# Patient Record
Sex: Male | Born: 1960
Health system: Southern US, Community
[De-identification: ages and names within clinical notes are randomized; demographics above are authoritative.]

## PROBLEM LIST (undated history)

## (undated) DIAGNOSIS — I10 Essential (primary) hypertension: Secondary | ICD-10-CM

## (undated) DIAGNOSIS — E119 Type 2 diabetes mellitus without complications: Secondary | ICD-10-CM

## (undated) DIAGNOSIS — R252 Cramp and spasm: Secondary | ICD-10-CM

## (undated) DIAGNOSIS — K219 Gastro-esophageal reflux disease without esophagitis: Secondary | ICD-10-CM

## (undated) DIAGNOSIS — Z86711 Personal history of pulmonary embolism: Secondary | ICD-10-CM

## (undated) DIAGNOSIS — G709 Myoneural disorder, unspecified: Secondary | ICD-10-CM

## (undated) DIAGNOSIS — S46219A Strain of muscle, fascia and tendon of other parts of biceps, unspecified arm, initial encounter: Secondary | ICD-10-CM

## (undated) DIAGNOSIS — Z86718 Personal history of other venous thrombosis and embolism: Secondary | ICD-10-CM

## (undated) DIAGNOSIS — B079 Viral wart, unspecified: Secondary | ICD-10-CM

## (undated) DIAGNOSIS — K648 Other hemorrhoids: Secondary | ICD-10-CM

## (undated) DIAGNOSIS — T7840XA Allergy, unspecified, initial encounter: Secondary | ICD-10-CM

## (undated) DIAGNOSIS — E785 Hyperlipidemia, unspecified: Secondary | ICD-10-CM

## (undated) DIAGNOSIS — K635 Polyp of colon: Secondary | ICD-10-CM

## (undated) DIAGNOSIS — A498 Other bacterial infections of unspecified site: Secondary | ICD-10-CM

## (undated) DIAGNOSIS — M1990 Unspecified osteoarthritis, unspecified site: Secondary | ICD-10-CM

## (undated) DIAGNOSIS — E291 Testicular hypofunction: Secondary | ICD-10-CM

## (undated) HISTORY — DX: Allergy, unspecified, initial encounter: T78.40XA

## (undated) HISTORY — DX: Hyperlipidemia, unspecified: E78.5

## (undated) HISTORY — DX: Strain of muscle, fascia and tendon of other parts of biceps, unspecified arm, initial encounter: S46.219A

## (undated) HISTORY — PX: BACK SURGERY: SHX140

## (undated) HISTORY — DX: Essential (primary) hypertension: I10

## (undated) HISTORY — DX: Personal history of other venous thrombosis and embolism: Z86.718

## (undated) HISTORY — PX: SKIN LESION EXCISION: SHX2412

## (undated) HISTORY — PX: FISSURECTOMY: SHX5244

## (undated) HISTORY — PX: EXCISIONAL HEMORRHOIDECTOMY: SHX1541

## (undated) HISTORY — PX: WISDOM TOOTH EXTRACTION: SHX21

## (undated) HISTORY — DX: Cramp and spasm: R25.2

## (undated) HISTORY — DX: Other bacterial infections of unspecified site: A49.8

## (undated) HISTORY — DX: Type 2 diabetes mellitus without complications: E11.9

## (undated) HISTORY — DX: Testicular hypofunction: E29.1

## (undated) HISTORY — DX: Polyp of colon: K63.5

## (undated) HISTORY — DX: Personal history of pulmonary embolism: Z86.711

## (undated) HISTORY — DX: Unspecified osteoarthritis, unspecified site: M19.90

---

## 1998-08-09 ENCOUNTER — Emergency Department (HOSPITAL_COMMUNITY): Admission: EM | Admit: 1998-08-09 | Discharge: 1998-08-09 | Payer: Self-pay | Admitting: Emergency Medicine

## 1998-08-10 ENCOUNTER — Ambulatory Visit (HOSPITAL_BASED_OUTPATIENT_CLINIC_OR_DEPARTMENT_OTHER): Admission: RE | Admit: 1998-08-10 | Discharge: 1998-08-10 | Payer: Self-pay | Admitting: *Deleted

## 2009-12-25 HISTORY — PX: APPENDECTOMY: SHX54

## 2010-11-22 ENCOUNTER — Encounter: Payer: Self-pay | Admitting: Emergency Medicine

## 2010-11-22 ENCOUNTER — Ambulatory Visit: Payer: Self-pay | Admitting: Diagnostic Radiology

## 2010-11-23 ENCOUNTER — Observation Stay (HOSPITAL_COMMUNITY)
Admission: EM | Admit: 2010-11-23 | Discharge: 2010-11-24 | Payer: Self-pay | Source: Home / Self Care | Admitting: Emergency Medicine

## 2010-12-25 DIAGNOSIS — K648 Other hemorrhoids: Secondary | ICD-10-CM

## 2010-12-25 HISTORY — DX: Other hemorrhoids: K64.8

## 2010-12-25 HISTORY — PX: COLONOSCOPY W/ BIOPSIES AND POLYPECTOMY: SHX1376

## 2011-03-08 LAB — URINALYSIS, ROUTINE W REFLEX MICROSCOPIC
Specific Gravity, Urine: 1.018 (ref 1.005–1.030)
pH: 7 (ref 5.0–8.0)

## 2011-03-08 LAB — DIFFERENTIAL
Basophils Absolute: 0.1 10*3/uL (ref 0.0–0.1)
Basophils Relative: 1 % (ref 0–1)
Monocytes Relative: 7 % (ref 3–12)
Neutro Abs: 10.3 10*3/uL — ABNORMAL HIGH (ref 1.7–7.7)
Neutrophils Relative %: 79 % — ABNORMAL HIGH (ref 43–77)

## 2011-03-08 LAB — COMPREHENSIVE METABOLIC PANEL
Alkaline Phosphatase: 65 U/L (ref 39–117)
BUN: 15 mg/dL (ref 6–23)
Creatinine, Ser: 0.9 mg/dL (ref 0.4–1.5)
Glucose, Bld: 104 mg/dL — ABNORMAL HIGH (ref 70–99)
Potassium: 4 mEq/L (ref 3.5–5.1)
Total Protein: 7.2 g/dL (ref 6.0–8.3)

## 2011-03-08 LAB — CBC
HCT: 49 % (ref 39.0–52.0)
MCH: 33.4 pg (ref 26.0–34.0)
MCHC: 35 g/dL (ref 30.0–36.0)
MCV: 95.5 fL (ref 78.0–100.0)
RDW: 12.5 % (ref 11.5–15.5)
WBC: 13.1 10*3/uL — ABNORMAL HIGH (ref 4.0–10.5)

## 2012-07-09 HISTORY — PX: SHOULDER ARTHROSCOPY W/ ROTATOR CUFF REPAIR: SHX2400

## 2012-08-21 ENCOUNTER — Inpatient Hospital Stay (HOSPITAL_COMMUNITY)
Admission: AD | Admit: 2012-08-21 | Discharge: 2012-08-26 | DRG: 581 | Disposition: A | Discharge status: Home Health | Payer: BC Managed Care – PPO | Source: Ambulatory Visit | Attending: Orthopedic Surgery | Admitting: Orthopedic Surgery

## 2012-08-21 ENCOUNTER — Encounter (HOSPITAL_COMMUNITY): Payer: Self-pay | Admitting: General Practice

## 2012-08-21 ENCOUNTER — Other Ambulatory Visit: Payer: Self-pay | Admitting: Orthopedic Surgery

## 2012-08-21 ENCOUNTER — Inpatient Hospital Stay (HOSPITAL_COMMUNITY): Payer: BC Managed Care – PPO

## 2012-08-21 DIAGNOSIS — M009 Pyogenic arthritis, unspecified: Secondary | ICD-10-CM | POA: Diagnosis present

## 2012-08-21 DIAGNOSIS — Z887 Allergy status to serum and vaccine status: Secondary | ICD-10-CM

## 2012-08-21 DIAGNOSIS — Y834 Other reconstructive surgery as the cause of abnormal reaction of the patient, or of later complication, without mention of misadventure at the time of the procedure: Secondary | ICD-10-CM | POA: Diagnosis present

## 2012-08-21 DIAGNOSIS — Z6838 Body mass index (BMI) 38.0-38.9, adult: Secondary | ICD-10-CM

## 2012-08-21 DIAGNOSIS — Z860100 Personal history of colon polyps, unspecified: Secondary | ICD-10-CM

## 2012-08-21 DIAGNOSIS — Z886 Allergy status to analgesic agent status: Secondary | ICD-10-CM

## 2012-08-21 DIAGNOSIS — Y92009 Unspecified place in unspecified non-institutional (private) residence as the place of occurrence of the external cause: Secondary | ICD-10-CM

## 2012-08-21 DIAGNOSIS — T8140XA Infection following a procedure, unspecified, initial encounter: Principal | ICD-10-CM | POA: Diagnosis present

## 2012-08-21 DIAGNOSIS — Z9089 Acquired absence of other organs: Secondary | ICD-10-CM

## 2012-08-21 DIAGNOSIS — M62838 Other muscle spasm: Secondary | ICD-10-CM | POA: Diagnosis present

## 2012-08-21 DIAGNOSIS — K648 Other hemorrhoids: Secondary | ICD-10-CM | POA: Diagnosis present

## 2012-08-21 DIAGNOSIS — K219 Gastro-esophageal reflux disease without esophagitis: Secondary | ICD-10-CM | POA: Diagnosis present

## 2012-08-21 DIAGNOSIS — A4901 Methicillin susceptible Staphylococcus aureus infection, unspecified site: Secondary | ICD-10-CM | POA: Diagnosis present

## 2012-08-21 DIAGNOSIS — Z880 Allergy status to penicillin: Secondary | ICD-10-CM

## 2012-08-21 HISTORY — DX: Gastro-esophageal reflux disease without esophagitis: K21.9

## 2012-08-21 HISTORY — DX: Other hemorrhoids: K64.8

## 2012-08-21 LAB — COMPREHENSIVE METABOLIC PANEL
ALT: 19 U/L (ref 0–53)
BUN: 12 mg/dL (ref 6–23)
CO2: 28 mEq/L (ref 19–32)
Calcium: 10.2 mg/dL (ref 8.4–10.5)
Creatinine, Ser: 0.98 mg/dL (ref 0.50–1.35)
GFR calc Af Amer: >90 mL/min (ref >90)
GFR calc non Af Amer: >90 mL/min (ref >90)
Glucose, Bld: 122 mg/dL — ABNORMAL HIGH (ref 70–99)
Total Protein: 7.6 g/dL (ref 6.0–8.3)

## 2012-08-21 LAB — CBC
Hemoglobin: 15.5 g/dL (ref 13.0–17.0)
MCH: 31.5 pg (ref 26.0–34.0)
MCHC: 34.8 g/dL (ref 30.0–36.0)
MCV: 90.4 fL (ref 78.0–100.0)
RBC: 4.92 MIL/uL (ref 4.22–5.81)

## 2012-08-21 LAB — DIFFERENTIAL
Basophils Absolute: 0 10*3/uL (ref 0.0–0.1)
Eosinophils Relative: 0 % (ref 0–5)
Lymphocytes Relative: 18 % (ref 12–46)
Lymphs Abs: 1.7 10*3/uL (ref 0.7–4.0)
Neutro Abs: 6.6 10*3/uL (ref 1.7–7.7)
Neutrophils Relative %: 71 % (ref 43–77)

## 2012-08-21 LAB — GRAM STAIN

## 2012-08-21 LAB — SEDIMENTATION RATE: Sed Rate: 39 mm/hr — ABNORMAL HIGH (ref 0–16)

## 2012-08-21 MED ORDER — VANCOMYCIN HCL 1000 MG IV SOLR
2000.0000 mg | Freq: Once | INTRAVENOUS | Status: AC
  Administered 2012-08-21: 2000 mg via INTRAVENOUS
  Filled 2012-08-21: qty 2000

## 2012-08-21 MED ORDER — HYDROCODONE-ACETAMINOPHEN 5-325 MG PO TABS
1.0000 | ORAL_TABLET | ORAL | Status: DC | PRN
  Administered 2012-08-21: 1 via ORAL
  Administered 2012-08-24 – 2012-08-26 (×4): 2 via ORAL
  Filled 2012-08-21 (×5): qty 2

## 2012-08-21 MED ORDER — ALUM & MAG HYDROXIDE-SIMETH 200-200-20 MG/5ML PO SUSP: 30.0000 mL | Freq: Four times a day (QID) | ORAL | Status: DC | PRN

## 2012-08-21 MED ORDER — ONDANSETRON HCL 4 MG/2ML IJ SOLN: 4.0000 mg | Freq: Four times a day (QID) | INTRAMUSCULAR | Status: DC | PRN

## 2012-08-21 MED ORDER — LACTATED RINGERS IV SOLN
INTRAVENOUS | Status: DC
  Administered 2012-08-21: 10 mL/h via INTRAVENOUS

## 2012-08-21 MED ORDER — DOCUSATE SODIUM 100 MG PO CAPS
100.0000 mg | ORAL_CAPSULE | Freq: Two times a day (BID) | ORAL | Status: DC
  Administered 2012-08-21 – 2012-08-26 (×9): 100 mg via ORAL
  Filled 2012-08-21 (×9): qty 1

## 2012-08-21 MED ORDER — ONDANSETRON HCL 4 MG PO TABS: 4.0000 mg | ORAL_TABLET | Freq: Four times a day (QID) | ORAL | Status: DC | PRN

## 2012-08-21 MED ORDER — VANCOMYCIN HCL IN DEXTROSE 1-5 GM/200ML-% IV SOLN
1000.0000 mg | Freq: Two times a day (BID) | INTRAVENOUS | Status: DC
  Administered 2012-08-22 – 2012-08-24 (×6): 1000 mg via INTRAVENOUS
  Filled 2012-08-21 (×8): qty 200

## 2012-08-21 MED ORDER — CHLORHEXIDINE GLUCONATE 4 % EX LIQD
60.0000 mL | Freq: Once | CUTANEOUS | Status: AC
  Administered 2012-08-22: 4 via TOPICAL
  Filled 2012-08-21: qty 60

## 2012-08-21 NOTE — H&P (Signed)
Ian [name]    Chief Complaint: L shoulder pain  HPI: The patient is a 51 y.o. male who is 7 weeks post op from an arthroscopic left [facility] who had been doing well until this past Sunday. He had about 48 hours of general malaise, and his wife noticed some erythema to one of his portals. He presented to our office today where we aspirated both the bursae and deep in the joint. The joint yielded a cloudy synovial fluid concerning for deep infection. He is being admitted for IV antibiotics and arthroscopic lavage tomorrow.  PMH otherwise healthy  No past surgical history on file.  No family history on file.  Social History:  does not have a smoking history on file. He does not have any smokeless tobacco history on file. His alcohol and drug histories not on file.  Allergies: Penicillin(hives), Aspirin,tetanus  Prior to Admission medications   Not on File    ROS: as above  Physical Exam: General appearance: Pt appears pale.  L shoulder superior portal is erythematous and raised. He has early firm endpoints with some pain on ROM Vitals @VSRANGES @  Assessment/Plan Impression: Post op infection Left shoulder Plan of Action: Admit for IV antibiotics and I&D tomorrow  [facility] for Dr. Francena Hanly 08/21/2012, 5:10 PM

## 2012-08-21 NOTE — Progress Notes (Signed)
ANTIBIOTIC CONSULT NOTE - INITIAL  Pharmacy Consult for Vancomycin Indication: shoulder infection  Allergies not on file  Patient Measurements: Height: 5\' 9"  (175.3 cm) Weight: 257 lb 0.9 oz (116.6 kg) IBW/kg (Calculated) : 70.7   Labs: Estimated Creatinine Clearance: 121 ml/min (by C-G formula based on Cr of 0.9).  Microbiology: No results found for this or any previous visit (from the past 720 hour(s)).  Medical History: No past medical history on file.  Assessment: The patient is a 52 y.o. male who is 7 weeks post op from an arthroscopic left shoulder RTC repair [facility] who had been doing well until this past Sunday. He had about 48 hours of general malaise, and his wife noticed some erythema to one of his portals. He presented to MD office today for shoulder aspiration. The joint yielded a cloudy synovial fluid concerning for deep infection.   Pharmacy asked to dose Vancomycin  Goal of Therapy:  Vancomycin trough level 15-20 mcg/ml  Plan:  1) Vancomycin 2000 mg iv loading dose 2) Vancomycin 1000 mg iv Q 12 hours 3) Follow renal function, cultures, plan  Thank you. Okey Regal, PharmD [phone]  08/21/2012,6:23 PM

## 2012-08-22 ENCOUNTER — Encounter (HOSPITAL_COMMUNITY): Payer: Self-pay | Admitting: Anesthesiology

## 2012-08-22 ENCOUNTER — Encounter (HOSPITAL_COMMUNITY)
Admission: AD | Disposition: A | Discharge status: Home Health | Payer: Self-pay | Source: Ambulatory Visit | Attending: Orthopedic Surgery

## 2012-08-22 ENCOUNTER — Inpatient Hospital Stay (HOSPITAL_COMMUNITY): Payer: BC Managed Care – PPO | Admitting: Anesthesiology

## 2012-08-22 DIAGNOSIS — M009 Pyogenic arthritis, unspecified: Secondary | ICD-10-CM

## 2012-08-22 HISTORY — PX: SHOULDER ARTHROSCOPY: SHX128

## 2012-08-22 LAB — URINE CULTURE
Colony Count: NO GROWTH
Special Requests: NORMAL

## 2012-08-22 SURGERY — ARTHROSCOPY, SHOULDER
Anesthesia: General | Site: Shoulder | Laterality: Left | Wound class: Dirty or Infected

## 2012-08-22 MED ORDER — METOCLOPRAMIDE HCL 10 MG PO TABS
5.0000 mg | ORAL_TABLET | Freq: Three times a day (TID) | ORAL | Status: DC | PRN
  Filled 2012-08-22: qty 1

## 2012-08-22 MED ORDER — ROCURONIUM BROMIDE 100 MG/10ML IV SOLN
INTRAVENOUS | Status: DC | PRN
  Administered 2012-08-22: 50 mg via INTRAVENOUS

## 2012-08-22 MED ORDER — NEOSTIGMINE METHYLSULFATE 1 MG/ML IJ SOLN
INTRAMUSCULAR | Status: DC | PRN
  Administered 2012-08-22: 4 mg via INTRAVENOUS

## 2012-08-22 MED ORDER — TEMAZEPAM 15 MG PO CAPS
15.0000 mg | ORAL_CAPSULE | Freq: Every evening | ORAL | Status: DC | PRN
  Administered 2012-08-22 – 2012-08-25 (×3): 15 mg via ORAL
  Filled 2012-08-22 (×3): qty 1

## 2012-08-22 MED ORDER — CHLORHEXIDINE GLUCONATE 0.12 % MT SOLN
15.0000 mL | Freq: Two times a day (BID) | OROMUCOSAL | Status: DC
  Administered 2012-08-22 – 2012-08-23 (×3): 15 mL via OROMUCOSAL
  Filled 2012-08-22 (×2): qty 15

## 2012-08-22 MED ORDER — ARTIFICIAL TEARS OP OINT
TOPICAL_OINTMENT | OPHTHALMIC | Status: DC | PRN
  Administered 2012-08-22: 1 via OPHTHALMIC

## 2012-08-22 MED ORDER — MIDAZOLAM HCL 5 MG/5ML IJ SOLN
INTRAMUSCULAR | Status: DC | PRN
  Administered 2012-08-22: 2 mg via INTRAVENOUS

## 2012-08-22 MED ORDER — GLYCOPYRROLATE 0.2 MG/ML IJ SOLN
INTRAMUSCULAR | Status: DC | PRN
  Administered 2012-08-22: .6 mg via INTRAVENOUS

## 2012-08-22 MED ORDER — METOCLOPRAMIDE HCL 5 MG/ML IJ SOLN
5.0000 mg | Freq: Three times a day (TID) | INTRAMUSCULAR | Status: DC | PRN
  Filled 2012-08-22: qty 2

## 2012-08-22 MED ORDER — HYDROMORPHONE HCL PF 1 MG/ML IJ SOLN
0.2500 mg | INTRAMUSCULAR | Status: DC | PRN
  Administered 2012-08-22 (×2): 0.5 mg via INTRAVENOUS

## 2012-08-22 MED ORDER — ACETAMINOPHEN 650 MG RE SUPP: 650.0000 mg | Freq: Four times a day (QID) | RECTAL | Status: DC | PRN

## 2012-08-22 MED ORDER — PANTOPRAZOLE SODIUM 40 MG PO TBEC
40.0000 mg | DELAYED_RELEASE_TABLET | Freq: Every day | ORAL | Status: DC
  Administered 2012-08-22 – 2012-08-26 (×5): 40 mg via ORAL
  Filled 2012-08-22 (×4): qty 1

## 2012-08-22 MED ORDER — LACTATED RINGERS IV SOLN
INTRAVENOUS | Status: DC | PRN
  Administered 2012-08-22 (×2): via INTRAVENOUS

## 2012-08-22 MED ORDER — ACETAMINOPHEN 325 MG PO TABS: 650.0000 mg | ORAL_TABLET | Freq: Four times a day (QID) | ORAL | Status: DC | PRN

## 2012-08-22 MED ORDER — BISACODYL 5 MG PO TBEC: 5.0000 mg | DELAYED_RELEASE_TABLET | Freq: Every day | ORAL | Status: DC | PRN

## 2012-08-22 MED ORDER — DIPHENHYDRAMINE HCL 12.5 MG/5ML PO ELIX
12.5000 mg | ORAL_SOLUTION | ORAL | Status: DC | PRN
  Filled 2012-08-22: qty 10

## 2012-08-22 MED ORDER — CHLORHEXIDINE GLUCONATE CLOTH 2 % EX PADS
6.0000 | MEDICATED_PAD | Freq: Every day | CUTANEOUS | Status: DC
  Administered 2012-08-22: 6 via TOPICAL

## 2012-08-22 MED ORDER — ONDANSETRON HCL 4 MG PO TABS: 4.0000 mg | ORAL_TABLET | Freq: Four times a day (QID) | ORAL | Status: DC | PRN

## 2012-08-22 MED ORDER — PROPOFOL 10 MG/ML IV EMUL: INTRAVENOUS | Status: DC | PRN

## 2012-08-22 MED ORDER — ONDANSETRON HCL 4 MG/2ML IJ SOLN
INTRAMUSCULAR | Status: DC | PRN
  Administered 2012-08-22: 4 mg via INTRAVENOUS

## 2012-08-22 MED ORDER — SODIUM CHLORIDE 0.9 % IR SOLN
Status: DC | PRN
  Administered 2012-08-22 (×3): 3000 mL

## 2012-08-22 MED ORDER — EPHEDRINE SULFATE 50 MG/ML IJ SOLN
INTRAMUSCULAR | Status: DC | PRN
  Administered 2012-08-22 (×2): 5 mg via INTRAVENOUS
  Administered 2012-08-22: 10 mg via INTRAVENOUS
  Administered 2012-08-22: 5 mg via INTRAVENOUS

## 2012-08-22 MED ORDER — LIDOCAINE HCL 4 % MT SOLN
OROMUCOSAL | Status: DC | PRN
  Administered 2012-08-22: 4 mL via TOPICAL

## 2012-08-22 MED ORDER — ACETAMINOPHEN 10 MG/ML IV SOLN
INTRAVENOUS | Status: DC | PRN
  Administered 2012-08-22: 1000 mg via INTRAVENOUS

## 2012-08-22 MED ORDER — MENTHOL 3 MG MT LOZG: 1.0000 | LOZENGE | OROMUCOSAL | Status: DC | PRN

## 2012-08-22 MED ORDER — ONDANSETRON HCL 4 MG/2ML IJ SOLN: 4.0000 mg | Freq: Four times a day (QID) | INTRAMUSCULAR | Status: DC | PRN

## 2012-08-22 MED ORDER — ACETAMINOPHEN 10 MG/ML IV SOLN
INTRAVENOUS | Status: AC
  Filled 2012-08-22: qty 100

## 2012-08-22 MED ORDER — FENTANYL CITRATE 0.05 MG/ML IJ SOLN
INTRAMUSCULAR | Status: DC | PRN
  Administered 2012-08-22 (×2): 25 ug via INTRAVENOUS
  Administered 2012-08-22 (×3): 50 ug via INTRAVENOUS
  Administered 2012-08-22: 150 ug via INTRAVENOUS
  Administered 2012-08-22: 50 ug via INTRAVENOUS

## 2012-08-22 MED ORDER — LIDOCAINE HCL (CARDIAC) 20 MG/ML IV SOLN
INTRAVENOUS | Status: DC | PRN
  Administered 2012-08-22: 10 mg via INTRAVENOUS

## 2012-08-22 MED ORDER — LORATADINE 10 MG PO TABS
10.0000 mg | ORAL_TABLET | Freq: Every day | ORAL | Status: DC
  Administered 2012-08-22 – 2012-08-26 (×5): 10 mg via ORAL
  Filled 2012-08-22 (×5): qty 1

## 2012-08-22 MED ORDER — BIOTENE DRY MOUTH MT LIQD
15.0000 mL | Freq: Two times a day (BID) | OROMUCOSAL | Status: DC
  Administered 2012-08-22: 15 mL via OROMUCOSAL

## 2012-08-22 MED ORDER — METHOCARBAMOL 100 MG/ML IJ SOLN
500.0000 mg | Freq: Four times a day (QID) | INTRAVENOUS | Status: DC | PRN
  Filled 2012-08-22: qty 5

## 2012-08-22 MED ORDER — HYDROMORPHONE HCL PF 1 MG/ML IJ SOLN
0.5000 mg | INTRAMUSCULAR | Status: DC | PRN
  Administered 2012-08-23 (×2): 1 mg via INTRAVENOUS
  Filled 2012-08-22 (×2): qty 1

## 2012-08-22 MED ORDER — METHOCARBAMOL 500 MG PO TABS
500.0000 mg | ORAL_TABLET | Freq: Four times a day (QID) | ORAL | Status: DC | PRN
  Administered 2012-08-23 – 2012-08-26 (×6): 500 mg via ORAL
  Filled 2012-08-22 (×6): qty 1

## 2012-08-22 MED ORDER — MEPERIDINE HCL 25 MG/ML IJ SOLN: 6.2500 mg | INTRAMUSCULAR | Status: DC | PRN

## 2012-08-22 MED ORDER — MUPIROCIN 2 % EX OINT
1.0000 "application " | TOPICAL_OINTMENT | Freq: Two times a day (BID) | CUTANEOUS | Status: AC
  Administered 2012-08-22 – 2012-08-26 (×10): 1 via NASAL
  Filled 2012-08-22 (×2): qty 22

## 2012-08-22 MED ORDER — ENOXAPARIN SODIUM 40 MG/0.4ML ~~LOC~~ SOLN
40.0000 mg | SUBCUTANEOUS | Status: DC
  Administered 2012-08-23 – 2012-08-26 (×4): 40 mg via SUBCUTANEOUS
  Filled 2012-08-22 (×5): qty 0.4

## 2012-08-22 MED ORDER — PROMETHAZINE HCL 25 MG/ML IJ SOLN: 6.2500 mg | INTRAMUSCULAR | Status: DC | PRN

## 2012-08-22 MED ORDER — MIDAZOLAM HCL 2 MG/2ML IJ SOLN: 0.5000 mg | Freq: Once | INTRAMUSCULAR | Status: DC | PRN

## 2012-08-22 MED ORDER — LACTATED RINGERS IV SOLN
INTRAVENOUS | Status: DC
  Administered 2012-08-23: 09:00:00 via INTRAVENOUS

## 2012-08-22 MED ORDER — PROPOFOL 10 MG/ML IV BOLUS
INTRAVENOUS | Status: DC | PRN
  Administered 2012-08-22: 200 mg via INTRAVENOUS

## 2012-08-22 MED ORDER — PHENOL 1.4 % MT LIQD
1.0000 | OROMUCOSAL | Status: DC | PRN
  Filled 2012-08-22: qty 177

## 2012-08-22 SURGICAL SUPPLY — 49 items
BLADE CUTTER GATOR 3.5 (BLADE) ×2 IMPLANT
BLADE GREAT WHITE 4.2 (BLADE) ×2 IMPLANT
BLADE SURG 11 STRL SS (BLADE) ×2 IMPLANT
BOOTCOVER CLEANROOM LRG (PROTECTIVE WEAR) ×4 IMPLANT
BUR OVAL 4.0 (BURR) ×2 IMPLANT
CANISTER SUCT LVC 12 LTR MEDI- (MISCELLANEOUS) ×2 IMPLANT
CANNULA ACUFLEX KIT 5X76 (CANNULA) ×2 IMPLANT
CLOTH BEACON ORANGE TIMEOUT ST (SAFETY) ×2 IMPLANT
DRAPE INCISE 23X17 IOBAN STRL (DRAPES) ×1
DRAPE INCISE IOBAN 23X17 STRL (DRAPES) ×1 IMPLANT
DRAPE INCISE IOBAN 66X45 STRL (DRAPES) ×2 IMPLANT
DRAPE STERI 35X30 U-POUCH (DRAPES) ×2 IMPLANT
DRAPE SURG 17X11 SM STRL (DRAPES) ×2 IMPLANT
DRAPE U-SHAPE 47X51 STRL (DRAPES) ×2 IMPLANT
DRSG PAD ABDOMINAL 8X10 ST (GAUZE/BANDAGES/DRESSINGS) ×2 IMPLANT
DURAPREP 26ML APPLICATOR (WOUND CARE) ×4 IMPLANT
GLOVE BIO SURGEON STRL SZ7.5 (GLOVE) ×2 IMPLANT
GLOVE BIO SURGEON STRL SZ8 (GLOVE) ×2 IMPLANT
GLOVE EUDERMIC 7 POWDERFREE (GLOVE) ×2 IMPLANT
GLOVE SS BIOGEL STRL SZ 7.5 (GLOVE) ×1 IMPLANT
GLOVE SUPERSENSE BIOGEL SZ 7.5 (GLOVE) ×1
GOWN STRL NON-REIN LRG LVL3 (GOWN DISPOSABLE) ×2 IMPLANT
GOWN STRL REIN XL XLG (GOWN DISPOSABLE) ×4 IMPLANT
KIT BASIN OR (CUSTOM PROCEDURE TRAY) ×2 IMPLANT
KIT ROOM TURNOVER OR (KITS) ×2 IMPLANT
KIT SHOULDER TRACTION (DRAPES) ×2 IMPLANT
MANIFOLD NEPTUNE II (INSTRUMENTS) ×2 IMPLANT
NDL SUT 6 .5 CRC .975X.05 MAYO (NEEDLE) IMPLANT
NEEDLE MAYO TAPER (NEEDLE)
NEEDLE SPNL 18GX3.5 QUINCKE PK (NEEDLE) ×2 IMPLANT
NS IRRIG 1000ML POUR BTL (IV SOLUTION) ×2 IMPLANT
PACK SHOULDER (CUSTOM PROCEDURE TRAY) ×2 IMPLANT
PAD ARMBOARD 7.5X6 YLW CONV (MISCELLANEOUS) ×4 IMPLANT
SET ARTHROSCOPY TUBING (MISCELLANEOUS) ×1
SET ARTHROSCOPY TUBING LN (MISCELLANEOUS) ×1 IMPLANT
SLING ARM FOAM STRAP LRG (SOFTGOODS) IMPLANT
SLING ARM FOAM STRAP MED (SOFTGOODS) IMPLANT
SPONGE GAUZE 4X4 12PLY (GAUZE/BANDAGES/DRESSINGS) ×2 IMPLANT
SPONGE LAP 4X18 X RAY DECT (DISPOSABLE) ×2 IMPLANT
STRIP CLOSURE SKIN 1/2X4 (GAUZE/BANDAGES/DRESSINGS) ×2 IMPLANT
SUT MNCRL AB 3-0 PS2 18 (SUTURE) ×2 IMPLANT
SUT PDS AB 1 CT  36 (SUTURE)
SUT PDS AB 1 CT 36 (SUTURE) IMPLANT
SYR 20CC LL (SYRINGE) ×2 IMPLANT
TAPE PAPER 3X10 WHT MICROPORE (GAUZE/BANDAGES/DRESSINGS) ×2 IMPLANT
TOWEL OR 17X24 6PK STRL BLUE (TOWEL DISPOSABLE) ×2 IMPLANT
TOWEL OR 17X26 10 PK STRL BLUE (TOWEL DISPOSABLE) ×2 IMPLANT
WAND SUCTION MAX 4MM 90S (SURGICAL WAND) ×2 IMPLANT
WATER STERILE IRR 1000ML POUR (IV SOLUTION) ×2 IMPLANT

## 2012-08-22 NOTE — Progress Notes (Signed)
Pt's surgical PCR + for staph;negative for MRSA.  Positive staph PCR standing orders activated.

## 2012-08-22 NOTE — Op Note (Signed)
08/21/2012 - 08/22/2012  12:45 PM  PATIENT:   Ian [name]  51 y.o. male  PRE-OPERATIVE DIAGNOSIS:  left shoulder infection vs inflammation  POST-OPERATIVE DIAGNOSIS:  Same with recurrent rotator cuff tear  PROCEDURE:  Synovectomy, lavage G-H joint, bursectomy, removal retained suture and suture anchors debridement recurrent rotator cuff tear.  SURGEON:  Senaida Lange M.D.  ASSISTANTS: Shuford pac   ANESTHESIA:   GET  EBL: min  SPECIMEN:  Tissue and anchors for pathology  Drains: none   PATIENT DISPOSITION:  PACU - hemodynamically stable.    PLAN OF CARE: Admit to inpatient   I have spoken to Dr. Colonel Bald in pathology regarding analysis of the tissue and suture material  Dictation# [id number]

## 2012-08-22 NOTE — Anesthesia Preprocedure Evaluation (Addendum)
Anesthesia Evaluation  Patient identified by MRN, date of birth, ID band Patient awake    Reviewed: Allergy & Precautions, H&P , NPO status , Patient's Chart, lab work & pertinent test results  History of Anesthesia Complications Negative for: history of anesthetic complications  Airway Mallampati: II TM Distance: >3 FB Neck ROM: Full    Dental  (+) Teeth Intact and Dental Advisory Given   Pulmonary neg pulmonary ROS,  breath sounds clear to auscultation  Pulmonary exam normal       Cardiovascular negative cardio ROS  Rhythm:Regular Rate:Normal     Neuro/Psych negative neurological ROS     GI/Hepatic Neg liver ROS, GERD-  Medicated and Controlled,  Endo/Other  Morbid obesity  Renal/GU negative Renal ROS     Musculoskeletal   Abdominal (+) + obese,   Peds  Hematology   Anesthesia Other Findings   Reproductive/Obstetrics                          Anesthesia Physical Anesthesia Plan  ASA: II  Anesthesia Plan: General   Post-op Pain Management:    Induction: Intravenous  Airway Management Planned: Oral ETT  Additional Equipment:   Intra-op Plan:   Post-operative Plan: Extubation in OR  Informed Consent: I have reviewed the patients History and Physical, chart, labs and discussed the procedure including the risks, benefits and alternatives for the proposed anesthesia with the patient or authorized representative who has indicated his/her understanding and acceptance.   Dental advisory given  Plan Discussed with: CRNA, Anesthesiologist and Surgeon  Anesthesia Plan Comments: (Plan routine monitors, GETA)       Anesthesia Quick Evaluation

## 2012-08-22 NOTE — Consult Note (Signed)
[facility]    Date of Admission:  08/21/2012  Date of Consult:  08/22/2012  Reason for Consult:septic shoulder Referring Physician: Dr. Rennis Chris   HPI: Ian [name] is an 51 y.o. male. Who was  7 weeks post op from an arthroscopic left shoulder RTC repair on [facility] who had been doing well until this past Sunday. He had about 48 hours of general malaise, and his wife noticed some erythema to one of his portals. He presented to Dr. Supple\'s office today where aspiration of  both the bursae and deep in the joint was performed. The joint yielded a cloudy synovial fluid concerning for deep infection. He was admitted yesterday and started on vancomycin. He is going to the operating room today for formal incision and debridement and lavage of the joint and hopefully for obtainment of further cultures. He was not on antibiotics prior to the past for it having been performed at Dr. supple\'s office. Patient has a stated allergy to penicillin that he had as a child. He states he was evaluated by an allergist in [place] who confirmed his allergy to penicillin. He believes he has tolerated Keflex in the past without difficulty although he was unsure when he received it. I went back and looked through the old electronic medical record provided by [employer] and could find no record of him receiving a supple Sporn in house prior to his appendectomy.     Past Medical History  Diagnosis Date  . GERD (gastroesophageal reflux disease)   . Internal bleeding hemorrhoids 2012    "might bleed once/month; only when I strain"    Past Surgical History  Procedure Date  . Shoulder arthroscopy w/ rotator cuff repair 07/09/2012    left  . Appendectomy 2011  . Colonoscopy w/ biopsies and polypectomy 2012  ergies:   Allergies  Allergen Reactions  . Asa (Aspirin) Nausea Only and Rash  . Penicillins Rash  . Pork-Derived Products Diarrhea and Nausea And Vomiting  .  Tetanus Toxoids Anaphylaxis  . Beef-Derived Products Nausea And Vomiting    "don\'t digest beef very well"     Medications: I have reviewed patients current medications as documented in Epic Anti-infectives     Start     Dose/Rate Route Frequency Ordered Stop   08/22/12 0800   vancomycin (VANCOCIN) IVPB 1000 mg/200 mL premix        1,000 mg 200 mL/hr over 60 Minutes Intravenous Every 12 hours 08/21/12 1829     08 /28/13 1930   vancomycin (VANCOCIN) 2,000 mg in sodium chloride 0.9 % 500 mL IVPB        2,000 mg 250 mL/hr over 120 Minutes Intravenous  Once 08/21/12 1829 08/21/12 2300          Social History:  reports that he has never smoked. He has never used smokeless tobacco. He reports that he drinks alcohol. He reports that he does not use illicit drugs.  History reviewed. No pertinent family history.  As in HPI and primary teams notes otherwise 12 point review of systems is negative  Blood pressure 137/87, pulse 78, temperature 98 F (36.7 C), temperature source Oral, resp. rate 18, height 5\' 9"  (1.753 m), weight 257 lb 0.9 oz (116.6 kg), SpO2 99.00%. General: Alert and awake, oriented x3, not in any acute distress. HEENT: anicteric sclera, pupils reactive to light and accommodation, EOMI, oropharynx clear and without exudate CVS regular rate, normal r,  no murmur rubs or  gallops Chest: clear to auscultation bilaterally, no wheezing, rales or rhonchi Abdomen: soft nontender, nondistended, normal bowel sounds, Extremities: He has raised ears edema overlying the left shoulder. This is warm and tender as is the entire joint Skin: See above Neuro: nonfocal, strength and sensation intact   Results for orders placed during the hospital encounter of 08/21/12 (from the past 48 hour(s))  BODY FLUID CULTURE     Status: Normal (Preliminary result)   Collection Time   08/21/12  6:23 PM      Component Value Range Comment   Specimen Description FLUID SHOULDER LEFT      Special  Requests BURSAE FLUID GLENOHUMERAL JOINT FLUID ON SWABS      Gram Stain        Value: ABUNDANT WBC PRESENT,BOTH PMN AND MONONUCLEAR     NO ORGANISMS SEEN     Gram Stain Report Called to,Read Back By and Verified With: Gram Stain Report Called to,Read Back By and Verified With: DR SUPPLE 2057 08/21/12 BY A BROWNING   Culture PENDING      Report Status PENDING     ANAEROBIC CULTURE     Status: Normal (Preliminary result)   Collection Time   08/21/12  6:25 PM      Component Value Range Comment   Specimen Description FLUID SHOULDER LEFT      Special Requests BURSAE FLUID GLENOHUMERAL JOINT FLUID ON SWABS      Gram Stain        Value: ABUNDANT WBC PRESENT,BOTH PMN AND MONONUCLEAR     NO ORGANISMS SEEN     Gram Stain Report Called to,Read Back By and Verified With: Gram Stain Report Called to,Read Back By and Verified With: DR SUPPLE 2057 08/21/12 BY A BROWNING   Culture        Value: NO ANAEROBES ISOLATED; CULTURE IN PROGRESS FOR 5 DAYS   Report Status PENDING     GRAM STAIN     Status: Normal   Collection Time   08/21/12  6:25 PM      Component Value Range Comment   Specimen Description FLUID SHOULDER LEFT      Special Requests BURSAE FLUID GLENOHUMERAL JOINT FLUID ON SWABS      Gram Stain        Value: ABUNDANT WBC PRESENT,BOTH PMN AND MONONUCLEAR     NO ORGANISMS SEEN     Gram Stain Report Called to,Read Back By and Verified With: DR SUPPLE 2057 08/21/12 A BROWNING   Report Status 08/21/2012 FINAL     BODY FLUID CULTURE     Status: Normal (Preliminary result)   Collection Time   08/21/12  6:35 PM      Component Value Range Comment   Specimen Description FLUID SHOULDER LEFT      Special Requests        Value: GLENOHUMERAL ON SWABS LEFT GLENOHUMERAL FLUID ON SWABS BURSAE ON SWABS   Gram Stain        Value: FEW WBC PRESENT,BOTH PMN AND MONONUCLEAR     NO ORGANISMS SEEN     Gram Stain Report Called to,Read Back By and Verified With: Gram Stain Report Called to,Read Back By and Verified  With: DR SUPPLE 2057 08/21/12 BY A BROWNING Performed at [facility]   Culture PENDING      Report Status PENDING     ANAEROBIC CULTURE     Status: Normal (Preliminary result)   Collection Time   08/21/12  6:36 PM  Component Value Range Comment   Specimen Description FLUID SHOULDER LEFT      Special Requests        Value: GLENOHUMERAL ON SWABS LEFT GLENOHUMERAL FLUID ON SWABS BURSAE FLUID ON SWABS   Gram Stain        Value: FEW WBC PRESENT,BOTH PMN AND MONONUCLEAR     NO ORGANISMS SEEN     Gram Stain Report Called to,Read Back By and Verified With: Gram Stain Report Called to,Read Back By and Verified With: DR SUPPLE 2057 08/21/12 BY A BROWNING Performed at [facility]   Culture        Value: NO ANAEROBES ISOLATED; CULTURE IN PROGRESS FOR 5 DAYS   Report Status PENDING     GRAM STAIN     Status: Normal   Collection Time   08/21/12  6:36 PM      Component Value Range Comment   Specimen Description FLUID SHOULDER LEFT      Special Requests GLENOHUMERAL SWABS BURSAE FLUID ON SWABS      Gram Stain        Value: FEW WBC PRESENT,BOTH PMN AND MONONUCLEAR     NO ORGANISMS SEEN     Gram Stain Report Called to,Read Back By and Verified With: DR SUPPLE 2057 08/21/12 A BROWNING   Report Status 08/21/2012 FINAL     SURGICAL PCR SCREEN     Status: Abnormal   Collection Time   08/21/12  6:44 PM      Component Value Range Comment   MRSA, PCR NEGATIVE  NEGATIVE    Staphylococcus aureus POSITIVE (*) NEGATIVE   CBC     Status: Normal   Collection Time   08/21/12  7:28 PM      Component Value Range Comment   WBC 9.3  4.0 - 10.5 K/uL    RBC 4.92  4.22 - 5.81 MIL/uL    Hemoglobin 15.5  13.0 - 17.0 g/dL    HCT 09.8  11.9 - 14.7 %    MCV 90.4  78.0 - 100.0 fL    MCH 31.5  26.0 - 34.0 pg    MCHC 34.8  30.0 - 36.0 g/dL    RDW 82.9  56.2 - 13.0 %    Platelets 224  150 - 400 K/uL   COMPREHENSIVE METABOLIC PANEL     Status: Abnormal   Collection Time   08/21/12  7:28 PM       Component Value Range Comment   Sodium 136  135 - 145 mEq/L    Potassium 4.5  3.5 - 5.1 mEq/L    Chloride 98  96 - 112 mEq/L    CO2 28  19 - 32 mEq/L    Glucose, Bld 122 (*) 70 - 99 mg/dL    BUN 12  6 - 23 mg/dL    Creatinine, Ser 8.65  0.50 - 1.35 mg/dL    Calcium 78.4  8.4 - 10.5 mg/dL    Total Protein 7.6  6.0 - 8.3 g/dL    Albumin 3.4 (*) 3.5 - 5.2 g/dL    AST 18  0 - 37 U/L    ALT 19  0 - 53 U/L    Alkaline Phosphatase 64  39 - 117 U/L    Total Bilirubin 0.6  0.3 - 1.2 mg/dL    GFR calc non Af Amer >90  >90 mL/min    GFR calc Af Amer >90  >90 mL/min   DIFFERENTIAL     Status: Normal  Collection Time   08/21/12  7:28 PM      Component Value Range Comment   Neutrophils Relative 71  43 - 77 %    Neutro Abs 6.6  1.7 - 7.7 K/uL    Lymphocytes Relative 18  12 - 46 %    Lymphs Abs 1.7  0.7 - 4.0 K/uL    Monocytes Relative 10  3 - 12 %    Monocytes Absolute 0.9  0.1 - 1.0 K/uL    Eosinophils Relative 0  0 - 5 %    Eosinophils Absolute 0.0  0.0 - 0.7 K/uL    Basophils Relative 0  0 - 1 %    Basophils Absolute 0.0  0.0 - 0.1 K/uL   C-REACTIVE PROTEIN     Status: Abnormal   Collection Time   08/21/12  7:28 PM      Component Value Range Comment   CRP 8.8 (*) <0.60 mg/dL   SEDIMENTATION RATE     Status: Abnormal   Collection Time   08/21/12  7:28 PM      Component Value Range Comment   Sed Rate 39 (*) 0 - 16 mm/hr       Component Value Date/Time   SDES FLUID SHOULDER LEFT 08/21/2012 1836   SDES FLUID SHOULDER LEFT 08/21/2012 1836   SPECREQUEST GLENOHUMERAL ON SWABS LEFT GLENOHUMERAL FLUID ON SWABS BURSAE FLUID ON SWABS 08/21/2012 1836   SPECREQUEST GLENOHUMERAL SWABS BURSAE FLUID ON SWABS 08/21/2012 1836   CULT NO ANAEROBES ISOLATED; CULTURE IN PROGRESS FOR 5 DAYS 08/21/2012 1836   REPTSTATUS PENDING 08/21/2012 1836   REPTSTATUS 08/21/2012 FINAL 08/21/2012 1836   X-ray Chest Pa And Lateral   08/21/2012  *RADIOLOGY REPORT*  Clinical Data: Preoperative assessment for left shoulder  surgery  CHEST - 2 VIEW  Comparison: 11/23/2010  Findings: Normal heart size, mediastinal contours, and pulmonary vascularity. Lungs clear. No pleural effusion or pneumothorax. No acute osseous findings.  IMPRESSION: No acute abnormalities.   Original Report Authenticated By: Lollie Marrow, M.D.      Recent Results (from the past 720 hour(s))  BODY FLUID CULTURE     Status: Normal (Preliminary result)   Collection Time   08/21/12  6:23 PM      Component Value Range Status Comment   Specimen Description FLUID SHOULDER LEFT   Final    Special Requests BURSAE FLUID GLENOHUMERAL JOINT FLUID ON SWABS   Final    Gram Stain     Final    Value: ABUNDANT WBC PRESENT,BOTH PMN AND MONONUCLEAR     NO ORGANISMS SEEN     Gram Stain Report Called to,Read Back By and Verified With: Gram Stain Report Called to,Read Back By and Verified With: DR SUPPLE 2057 08/21/12 BY A BROWNING   Culture PENDING   Incomplete    Report Status PENDING   Incomplete   ANAEROBIC CULTURE     Status: Normal (Preliminary result)   Collection Time   08/21/12  6:25 PM      Component Value Range Status Comment   Specimen Description FLUID SHOULDER LEFT   Final    Special Requests BURSAE FLUID GLENOHUMERAL JOINT FLUID ON SWABS   Final    Gram Stain     Final    Value: ABUNDANT WBC PRESENT,BOTH PMN AND MONONUCLEAR     NO ORGANISMS SEEN     Gram Stain Report Called to,Read Back By and Verified With: Gram Stain Report Called to,Read Back By and Verified With: DR  SUPPLE 2057 08/21/12 BY A BROWNING   Culture     Final    Value: NO ANAEROBES ISOLATED; CULTURE IN PROGRESS FOR 5 DAYS   Report Status PENDING   Incomplete   GRAM STAIN     Status: Normal   Collection Time   08/21/12  6:25 PM      Component Value Range Status Comment   Specimen Description FLUID SHOULDER LEFT   Final    Special Requests BURSAE FLUID GLENOHUMERAL JOINT FLUID ON SWABS   Final    Gram Stain     Final    Value: ABUNDANT WBC PRESENT,BOTH PMN AND MONONUCLEAR      NO ORGANISMS SEEN     Gram Stain Report Called to,Read Back By and Verified With: DR SUPPLE 2057 08/21/12 A BROWNING   Report Status 08/21/2012 FINAL   Final   BODY FLUID CULTURE     Status: Normal (Preliminary result)   Collection Time   08/21/12  6:35 PM      Component Value Range Status Comment   Specimen Description FLUID SHOULDER LEFT   Final    Special Requests     Final    Value: GLENOHUMERAL ON SWABS LEFT GLENOHUMERAL FLUID ON SWABS BURSAE ON SWABS   Gram Stain     Final    Value: FEW WBC PRESENT,BOTH PMN AND MONONUCLEAR     NO ORGANISMS SEEN     Gram Stain Report Called to,Read Back By and Verified With: Gram Stain Report Called to,Read Back By and Verified With: DR SUPPLE 2057 08/21/12 BY A BROWNING Performed at [facility]   Culture PENDING   Incomplete    Report Status PENDING   Incomplete   ANAEROBIC CULTURE     Status: Normal (Preliminary result)   Collection Time   08/21/12  6:36 PM      Component Value Range Status Comment   Specimen Description FLUID SHOULDER LEFT   Final    Special Requests     Final    Value: GLENOHUMERAL ON SWABS LEFT GLENOHUMERAL FLUID ON SWABS BURSAE FLUID ON SWABS   Gram Stain     Final    Value: FEW WBC PRESENT,BOTH PMN AND MONONUCLEAR     NO ORGANISMS SEEN     Gram Stain Report Called to,Read Back By and Verified With: Gram Stain Report Called to,Read Back By and Verified With: DR SUPPLE 2057 08/21/12 BY A BROWNING Performed at [facility]   Culture     Final    Value: NO ANAEROBES ISOLATED; CULTURE IN PROGRESS FOR 5 DAYS   Report Status PENDING   Incomplete   GRAM STAIN     Status: Normal   Collection Time   08/21/12  6:36 PM      Component Value Range Status Comment   Specimen Description FLUID SHOULDER LEFT   Final    Special Requests GLENOHUMERAL SWABS BURSAE FLUID ON SWABS   Final    Gram Stain     Final    Value: FEW WBC PRESENT,BOTH PMN AND MONONUCLEAR     NO ORGANISMS SEEN     Gram Stain Report Called to,Read Back By  and Verified With: DR SUPPLE 2057 08/21/12 A BROWNING   Report Status 08/21/2012 FINAL   Final   SURGICAL PCR SCREEN     Status: Abnormal   Collection Time   08/21/12  6:44 PM      Component Value Range Status Comment   MRSA, PCR NEGATIVE  NEGATIVE Final    Staphylococcus aureus POSITIVE (*) NEGATIVE Final      Impression/Recommendation  51 year old man with presumed septic left shoulder after arthroscopic surgery. So far cultures are pending.  1) septic shoulder: Per my discussions with Dr. supple this morning there is also been concern for possible reaction to a pin was placed in the operating room instead of infection though Dr. supple agrees that it would be better to treat the patient presumptively for infection. Hopefully the cultures obtained yesterday from the deep joint space will yield organism. The patient could have had his antibiotics delayed until he was in the operating room so that further specimens could be collected off antibiotics.  --For now I will leave him on IV vancomycin. Should he need gram-negative coverage we could consider giving him a cephalosporin given his stated tolerance to Keflex in the past. We also consider a cephalosporin if we isolate a methicillin sensitive staph aureus. A carbapenem would be another possibility if gram-negative coverage were needed. --Will send a sedimentation rate and C-reactive protein with his morning labs   2) screening I will check an HIV antibody  3) urine culture: Not clear whether this is ordered as the patient is asymptomatic and with negative. I would consider cancelling the clture  Thank you so much for this interesting consult  [facility] [phone] (pager) [phone] (office) 08/22/2012, 11:51 AM  Ian [name] 08/22/2012, 11:51 AM

## 2012-08-22 NOTE — Transfer of Care (Signed)
Immediate Anesthesia Transfer of Care Note  Patient: Ian [name]  Procedure(s) Performed: Procedure(s) (LRB): ARTHROSCOPY SHOULDER (Left)  Patient Location: PACU  Anesthesia Type: General  Level of Consciousness: awake, alert  and oriented  Airway & Oxygen Therapy: Patient Spontanous Breathing and Patient connected to nasal cannula oxygen  Post-op Assessment: Report given to PACU RN, Post -op Vital signs reviewed and stable and Patient moving all extremities  Post vital signs: Reviewed and stable  Complications: No apparent anesthesia complications

## 2012-08-22 NOTE — Anesthesia Postprocedure Evaluation (Signed)
  Anesthesia Post-op Note  Patient: Ian [name]  Procedure(s) Performed: Procedure(s) (LRB): ARTHROSCOPY SHOULDER (Left)  Patient Location: PACU  Anesthesia Type: General  Level of Consciousness: awake, alert  and oriented  Airway and Oxygen Therapy: Patient Spontanous Breathing and Patient connected to nasal cannula oxygen  Post-op Pain: mild  Post-op Assessment: Post-op Vital signs reviewed, Patient's Cardiovascular Status Stable, Respiratory Function Stable, Patent Airway, No signs of Nausea or vomiting and Pain level controlled  Post-op Vital Signs: Reviewed and stable  Complications: No apparent anesthesia complications

## 2012-08-22 NOTE — Anesthesia Procedure Notes (Signed)
Procedure Name: Intubation Date/Time: 08/22/2012 11:21 AM Performed by: Luster Landsberg Pre-anesthesia Checklist: Patient identified, Emergency Drugs available, Suction available and Patient being monitored Patient Re-evaluated:Patient Re-evaluated prior to inductionOxygen Delivery Method: Circle system utilized Preoxygenation: Pre-oxygenation with 100% oxygen Intubation Type: IV induction Ventilation: Two handed mask ventilation required and Oral airway inserted - appropriate to patient size Laryngoscope Size: Mac and 3 Grade View: Grade I Tube type: Oral Tube size: 8.0 mm Number of attempts: 1 Airway Equipment and Method: Stylet Placement Confirmation: ETT inserted through vocal cords under direct vision,  positive ETCO2 and breath sounds checked- equal and bilateral Secured at: 24 cm Tube secured with: Tape Dental Injury: Teeth and Oropharynx as per pre-operative assessment

## 2012-08-22 NOTE — Progress Notes (Signed)
INITIAL ADULT NUTRITION ASSESSMENT Date: 08/22/2012   Time: 11:47 AM Reason for Assessment: nutrition risk  ASSESSMENT: Male 51 y.o.  Dx: shoulder pain  Hx:  Past Medical History  Diagnosis Date  . GERD (gastroesophageal reflux disease)   . Internal bleeding hemorrhoids 2012    "might bleed once/month; only when I strain"   Past Surgical History  Procedure Date  . Shoulder arthroscopy w/ rotator cuff repair 07/09/2012    left  . Appendectomy 2011  . Colonoscopy w/ biopsies and polypectomy 2012    Related Meds:  Scheduled Meds:   . antiseptic oral rinse  15 mL Mouth Rinse q12n4p  . chlorhexidine  60 mL Topical Once  . chlorhexidine  15 mL Mouth Rinse BID  . Chlorhexidine Gluconate Cloth  6 each Topical Daily  . docusate sodium  100 mg Oral BID  . mupirocin ointment  1 application Nasal BID  . vancomycin  2,000 mg Intravenous Once  . vancomycin  1,000 mg Intravenous Q12H   Continuous Infusions:   . lactated ringers 10 mL/hr (08/21/12 2059)   PRN Meds:.alum & mag hydroxide-simeth, HYDROcodone-acetaminophen, ondansetron (ZOFRAN) IV, ondansetron   Ht: 5\' 9"  (175.3 cm)  Wt: 257 lb 0.9 oz (116.6 kg)  Ideal Wt: 72.7 kg % Ideal Wt: 160%  Usual Wt: unknown Last known wt: 121 kg (2011) % Usual wt: 96.4%  Body mass index is 37.96 kg/(m^2).  Food/Nutrition Related Hx: unable to assess  Labs:  CMP     Component Value Date/Time   NA 136 08/21/2012 1928   K 4.5 08/21/2012 1928   CL 98 08/21/2012 1928   CO2 28 08/21/2012 1928   GLUCOSE 122* 08/21/2012 1928   BUN 12 08/21/2012 1928   CREATININE 0.98 08/21/2012 1928   CALCIUM 10.2 08/21/2012 1928   PROT 7.6 08/21/2012 1928   ALBUMIN 3.4* 08/21/2012 1928   AST 18 08/21/2012 1928   ALT 19 08/21/2012 1928   ALKPHOS 64 08/21/2012 1928   BILITOT 0.6 08/21/2012 1928   GFRNONAA >90 08/21/2012 1928   GFRAA >90 08/21/2012 1928    CBC    Component Value Date/Time   WBC 9.3 08/21/2012 1928   RBC 4.92 08/21/2012 1928   HGB 15.5  08/21/2012 1928   HCT 44.5 08/21/2012 1928   PLT 224 08/21/2012 1928   MCV 90.4 08/21/2012 1928   MCH 31.5 08/21/2012 1928   MCHC 34.8 08/21/2012 1928   RDW 12.1 08/21/2012 1928   LYMPHSABS 1.7 08/21/2012 1928   MONOABS 0.9 08/21/2012 1928   EOSABS 0.0 08/21/2012 1928   BASOSABS 0.0 08/21/2012 1928    Intake: NPO for surgery Output:   Intake/Output Summary (Last 24 hours) at 08/22/12 1149 Last data filed at 08/22/12 0600  Gross per 24 hour  Intake 330.17 ml  Output    300 ml  Net  30.17 ml   No BM since admission  Diet Order: NPO  Supplements/Tube Feeding: none at this time  IVF:    lactated ringers Last Rate: 10 mL/hr (08/21/12 2059)    Estimated Nutritional Needs:   Kcal: 2100-2330 Protein: 93-115g Fluid: >2.0 L/day  Pt off floor for I&D of L shoulder infection.  No family at bedside  Pt unsure about wt loss on admission. Review of available records shows pt weighed 121 kg in 2011 which represents a 4% wt change PTA.  Nutrition hx largely unknown at this time.  RD will follow for diet advancement and nutrition hx.  NUTRITION DIAGNOSIS: -Increased nutrient needs (NI-5.1).  Status: Ongoing  RELATED TO: healing  AS EVIDENCE BY: pt with post-op infection to L shoulder  MONITORING/EVALUATION(Goals): 1.  Food/Beverage; resume of PO diet once appropriate post-op 2.  Wt/Wt change; monitor trends  EDUCATION NEEDS: -Education not appropriate at this time  INTERVENTION: 1.  Modify diet; once diet advancement appropriate per MD discretion  Dietitian #: [phone]  DOCUMENTATION CODES Per approved criteria  -Obesity Unspecified    Loyce Dys Sue-Ellen 08/22/2012, 11:47 AM

## 2012-08-23 ENCOUNTER — Encounter (HOSPITAL_COMMUNITY): Payer: Self-pay | Admitting: Orthopedic Surgery

## 2012-08-23 DIAGNOSIS — M009 Pyogenic arthritis, unspecified: Secondary | ICD-10-CM

## 2012-08-23 LAB — HIV ANTIBODY (ROUTINE TESTING W REFLEX): HIV: NONREACTIVE

## 2012-08-23 MED ORDER — LACTATED RINGERS IV SOLN
INTRAVENOUS | Status: DC
  Administered 2012-08-23 – 2012-08-25 (×2): 20 mL/h via INTRAVENOUS

## 2012-08-23 NOTE — Progress Notes (Signed)
[facility]    Subjective: No new complaints   Antibiotics:  Anti-infectives     Start     Dose/Rate Route Frequency Ordered Stop   08/22/12 0800   vancomycin (VANCOCIN) IVPB 1000 mg/200 mL premix        1,000 mg 200 mL/hr over 60 Minutes Intravenous Every 12 hours 08/21/12 1829     08/21/12 1930   vancomycin (VANCOCIN) 2,000 mg in sodium chloride 0.9 % 500 mL IVPB        2,000 mg 250 mL/hr over 120 Minutes Intravenous  Once 08/21/12 1829 08/21/12 2300          Medications: Scheduled Meds:   . docusate sodium  100 mg Oral BID  . enoxaparin (LOVENOX) injection  40 mg Subcutaneous Q24H  . loratadine  10 mg Oral Daily  . mupirocin ointment  1 application Nasal BID  . pantoprazole  40 mg Oral Q1200  . vancomycin  1,000 mg Intravenous Q12H  . DISCONTD: antiseptic oral rinse  15 mL Mouth Rinse q12n4p  . DISCONTD: chlorhexidine  15 mL Mouth Rinse BID  . DISCONTD: Chlorhexidine Gluconate Cloth  6 each Topical Daily   Continuous Infusions:   . lactated ringers 75 mL/hr at 08/23/12 0844  . lactated ringers 20 mL/hr (08/23/12 0848)   PRN Meds:.acetaminophen, acetaminophen, alum & mag hydroxide-simeth, bisacodyl, diphenhydrAMINE, HYDROcodone-acetaminophen, HYDROmorphone (DILAUDID) injection, menthol-cetylpyridinium, methocarbamol (ROBAXIN) IV, methocarbamol, metoCLOPramide (REGLAN) injection, metoCLOPramide, ondansetron (ZOFRAN) IV, ondansetron, phenol, temazepam   Objective: Weight change:   Intake/Output Summary (Last 24 hours) at 08/23/12 1818 Last data filed at 08/23/12 1340  Gross per 24 hour  Intake   2270 ml  Output   1150 ml  Net   1120 ml   Blood pressure 145/67, pulse 93, temperature 99 F (37.2 C), temperature source Oral, resp. rate 20, height 5\' 9"  (1.753 m), weight 257 lb 0.9 oz (116.6 kg), SpO2 99.00%. Temp:  [98 F (36.7 C)-99 F (37.2 C)] 99 F (37.2 C) (08/30 1340) Pulse Rate:  [80-93] 93  (08/30 1340) Resp:  [18-20]  20  (08/30 1340) BP: (131-150)/(67-84) 145/67 mmHg (08/30 1340) SpO2:  [95 %-99 %] 99 % (08/30 1340)  Physical Exam: General: Alert and awake, oriented x3, not in any acute distress.  Extremities: shoulder with diminished erythema, surgical sites are clean (examined with Ralene Bathe PA)  Skin: no rashes  Neuro: nonfocal  Lab Results:  [facility] 08/21/12 1928  WBC 9.3  HGB 15.5  HCT 44.5  PLT 224    BMET  Basename 08/21/12 1928  NA 136  K 4.5  CL 98  CO2 28  GLUCOSE 122*  BUN 12  CREATININE 0.98  CALCIUM 10.2    Micro Results: Recent Results (from the past 240 hour(s))  BODY FLUID CULTURE     Status: Normal (Preliminary result)   Collection Time   08/21/12  6:23 PM      Component Value Range Status Comment   Specimen Description FLUID SHOULDER LEFT   Final    Special Requests BURSAE FLUID GLENOHUMERAL JOINT FLUID ON SWABS   Final    Gram Stain     Final    Value: ABUNDANT WBC PRESENT,BOTH PMN AND MONONUCLEAR     NO ORGANISMS SEEN     Gram Stain Report Called to,Read Back By and Verified With: Gram Stain Report Called to,Read Back By and Verified With: DR SUPPLE 2057 08/21/12 BY A BROWNING   Culture NO GROWTH 1 DAY  Final    Report Status PENDING   Incomplete   ANAEROBIC CULTURE     Status: Normal (Preliminary result)   Collection Time   08/21/12  6:25 PM      Component Value Range Status Comment   Specimen Description FLUID SHOULDER LEFT   Final    Special Requests BURSAE FLUID GLENOHUMERAL JOINT FLUID ON SWABS   Final    Gram Stain     Final    Value: ABUNDANT WBC PRESENT,BOTH PMN AND MONONUCLEAR     NO ORGANISMS SEEN     Gram Stain Report Called to,Read Back By and Verified With: Gram Stain Report Called to,Read Back By and Verified With: DR SUPPLE 2057 08/21/12 BY A BROWNING   Culture     Final    Value: NO ANAEROBES ISOLATED; CULTURE IN PROGRESS FOR 5 DAYS   Report Status PENDING   Incomplete   GRAM STAIN     Status: Normal   Collection Time   08/21/12   6:25 PM      Component Value Range Status Comment   Specimen Description FLUID SHOULDER LEFT   Final    Special Requests BURSAE FLUID GLENOHUMERAL JOINT FLUID ON SWABS   Final    Gram Stain     Final    Value: ABUNDANT WBC PRESENT,BOTH PMN AND MONONUCLEAR     NO ORGANISMS SEEN     Gram Stain Report Called to,Read Back By and Verified With: DR SUPPLE 2057 08/21/12 A BROWNING   Report Status 08/21/2012 FINAL   Final   BODY FLUID CULTURE     Status: Normal (Preliminary result)   Collection Time   08/21/12  6:35 PM      Component Value Range Status Comment   Specimen Description FLUID SHOULDER LEFT   Final    Special Requests     Final    Value: GLENOHUMERAL ON SWABS LEFT GLENOHUMERAL FLUID ON SWABS BURSAE ON SWABS   Gram Stain     Final    Value: FEW WBC PRESENT,BOTH PMN AND MONONUCLEAR     NO ORGANISMS SEEN     Gram Stain Report Called to,Read Back By and Verified With: Gram Stain Report Called to,Read Back By and Verified With: DR SUPPLE 2057 08/21/12 BY A BROWNING Performed at [facility]   Culture NO GROWTH 1 DAY   Final    Report Status PENDING   Incomplete   ANAEROBIC CULTURE     Status: Normal (Preliminary result)   Collection Time   08/21/12  6:36 PM      Component Value Range Status Comment   Specimen Description FLUID SHOULDER LEFT   Final    Special Requests     Final    Value: GLENOHUMERAL ON SWABS LEFT GLENOHUMERAL FLUID ON SWABS BURSAE FLUID ON SWABS   Gram Stain     Final    Value: FEW WBC PRESENT,BOTH PMN AND MONONUCLEAR     NO ORGANISMS SEEN     Gram Stain Report Called to,Read Back By and Verified With: Gram Stain Report Called to,Read Back By and Verified With: DR SUPPLE 2057 08/21/12 BY A BROWNING Performed at [facility]   Culture     Final    Value: NO ANAEROBES ISOLATED; CULTURE IN PROGRESS FOR 5 DAYS   Report Status PENDING   Incomplete   GRAM STAIN     Status: Normal   Collection Time   08/21/12  6:36 PM      Component  Value Range Status Comment     Specimen Description FLUID SHOULDER LEFT   Final    Special Requests GLENOHUMERAL SWABS BURSAE FLUID ON SWABS   Final    Gram Stain     Final    Value: FEW WBC PRESENT,BOTH PMN AND MONONUCLEAR     NO ORGANISMS SEEN     Gram Stain Report Called to,Read Back By and Verified With: DR SUPPLE 2057 08/21/12 A BROWNING   Report Status 08/21/2012 FINAL   Final   SURGICAL PCR SCREEN     Status: Abnormal   Collection Time   08/21/12  6:44 PM      Component Value Range Status Comment   MRSA, PCR NEGATIVE  NEGATIVE Final    Staphylococcus aureus POSITIVE (*) NEGATIVE Final   URINE CULTURE     Status: Normal   Collection Time   08/21/12  9:13 PM      Component Value Range Status Comment   Specimen Description URINE, CLEAN CATCH   Final    Special Requests Normal   Final    Culture  Setup Time 08/21/2012 22:00   Final    Colony Count NO GROWTH   Final    Culture NO GROWTH   Final    Report Status 08/22/2012 FINAL   Final     Studies/Results: X-ray Chest Pa And Lateral   08/21/2012  *RADIOLOGY REPORT*  Clinical Data: Preoperative assessment for left shoulder surgery  CHEST - 2 VIEW  Comparison: 11/23/2010  Findings: Normal heart size, mediastinal contours, and pulmonary vascularity. Lungs clear. No pleural effusion or pneumothorax. No acute osseous findings.  IMPRESSION: No acute abnormalities.   Original Report Authenticated By: Lollie Marrow, M.D.       Assessment/Plan: Ian [name] is a 51 y.o. 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

## 2012-08-23 NOTE — Op Note (Signed)
NAMESPARROW, SANZO NO.:  [id number]  MEDICAL RECORD NO.:  [id number]  LOCATION:  6N12C                        FACILITY:  [facility]  PHYSICIAN:  Vania Rea. Chellsie Gomer, M.D.  DATE OF BIRTH:  February 08, 1961  DATE OF PROCEDURE:  08/22/2012 DATE OF DISCHARGE:                              OPERATIVE REPORT   PREOPERATIVE DIAGNOSIS:  Left shoulder pain and swelling, 6 weeks out from rotator cuff repair with possible infection versus inflammatory reaction.  POSTOPERATIVE DIAGNOSIS:  Left shoulder pain and swelling, 6 weeks out from rotator cuff repair with possible infection versus inflammatory reaction.  PROCEDURE: 1. Left shoulder examination under anesthesia. 2. Left shoulder glenoid joint diagnostic arthroscopy. 3. Extensive synovectomy as well as lavage to the glenohumeral joint. 4. Subacromial bursectomy and lysis of adhesions. 5. Removal of retained suture and suture anchors from previous rotator     cuff repair. 6. Debridement of recurrent rotator cuff tear.  SURGEON:  Vania Rea. Linell Meldrum, MD  ASSISTANT:  Lucita Lora. Shuford, PA-C  ANESTHESIA:  General endotracheal.  ESTIMATED BLOOD LOSS:  Minimal.  DRAINS:  None.  SPECIMENS:  Synovial tissue from glenoid joint as well as subacromial bursal region was sent for routine pathology.  In addition, we sent the anchors and these suture material to Pathology for hopes that there may be evidence of some type of inflammatory reaction that developed in response to the anchors and/or suture material that would be evident in the surrounding soft tissues.  HISTORY:  Ian [name] is a 51 year old gentleman who back in mid July underwent an arthroscopic rotator cuff repair that I had performed at [facility].  He did well initially postop but then yesterday presented to the office with a 48 hour history of increasing left shoulder pain, localized swelling as well as some erythema about his lateral acromial  arthroscopy portal.  I performed an aspiration in the office, which yielded 2 mL of cloudy synovial fluid and this was sent for routine culture, Gram stain, as well as an aspirate from the subacromial space.  Both of these showed multiple white cells, predominantly polys but no organisms.  He was empirically placed on vancomycin and was brought to the operating at this time for planned arthroscopic lavage and debridement.  Briefly I counseled Ian [name] on treatment options as well as risks versus benefits thereof.  Possible surgical complications were reviewed with potential for persistent bleeding, infection, persistent pain, anesthetic complication, possible need for additional surgery. They understand and accept and agreed to planned procedure.  PROCEDURE IN DETAIL:  After undergoing routine preop evaluation, the patient was brought to the holding area.  He was maintained on a course of empiric vancomycin.  He was brought to the operating room, placed supine on operative table under smooth induction of a general endotracheal anesthesia.  Turned to the right lateral decubitus position on beanbag and appropriately padded and protected.  Left shoulder examination under anesthesia revealed full forward elevation, but restricted external rotation of 60 degrees.  Gentle manipulation was performed.  Left arm was then suspended at 70 degrees abduction, 10 pounds of traction.  Left shoulder region was sterilely prepped and draped  in standard fashion.  Time-out was called.  Posterior portal was established in the glenohumeral joint and anterior portal established under direct visualization.  Large hemiarthrosis was evacuated.  There was noted to be intense tenosynovitis with marked proliferative overgrowth of the synovial tissues as well as multiple areas of the fibrinous exudate which had organized.  Several samples of the exudate and the synovial tissue were obtained and then  extensive synovectomy was performed with a shaver.  The glenohumeral articular surfaces were in good condition.  I then used the Stryker wand to obtain hemostasis. Once this was completed, the arm was then dropped.  Fluid and instruments were removed.  The arm was dropped down to 30 degrees abduction with arthroscope introduced in the subacromial space.  The posterior portal and a direct lateral portal was established in the subacromial space.  Abundant dense bursal tissue and multiple adhesions were encountered.  These were all divided and excised with combination of the shaver as well as Stryker wand.  The bursal surface of the rotator cuff was markedly inflamed and had a somewhat compromised degenerative appearance with marked edema and hemorrhage.  The suture construct that had been utilized for the repair was obviously loose and appeared to have emanated from the pop locks which were used for the lateral row of a double row suture bridge repair construct.  I went ahead and trimmed the sutures and once this was performed, it was clear that the rotator cuff was deficient and had not healed and this gave [place] access to the medial row suture anchors and these were both removed with a reverse cutting screwdriver.  We then turned our attention to pop locks which we were able to remove and these showed evidence of the surrounding bone about the pop locks which was markedly eroded and had filled in with a gelatinous scar tissue and that was certainly grossly unstable and were withdrawn without difficulty.  We then irrigated and debrided the bony defects.  Final hemostasis was obtained.  Fluid and instrument removed.  The portals were then closed with a single 3-0 nylon.  Dry dressing taped about the left shoulder.  Left arm was placed in a sling.  The patient was awakened, extubated, and taken to recovery room in stable condition.     Vania Rea. Duwan Adrian, M.D.     KMS/MEDQ  D:  08/22/2012   T:  08/23/2012  Job:  [id number]

## 2012-08-23 NOTE — Progress Notes (Signed)
Occupational Therapy Treatment Patient Details Name: QUINTERIOUS WALRAVEN MRN: [id number] DOB: 08-May-1961 Today's Date: 08/23/2012 Time: 2956-2130 OT Time Calculation (min): 30 min  OT Assessment / Plan / Recommendation Comments on Treatment Session Pt is doing really  well with pulley system    Follow Up Recommendations   (follow up per MD)    Barriers to Discharge       Equipment Recommendations  None recommended by OT    Recommendations for Other Services    Frequency Min 2X/week   Plan Discharge plan remains appropriate    Precautions / Restrictions Precautions Precautions: Shoulder Type of Shoulder Precautions: No ROM limitations, just based off of patient tolerance Required Braces or Orthoses:  (LUE sling for comfort) Restrictions Weight Bearing Restrictions: Yes LUE Weight Bearing: Non weight bearing   Pertinent Vitals/Pain LUE shoulder Tight and sore    ADL       OT Diagnosis:    OT Problem List:   OT Treatment Interventions:     OT Goals Miscellaneous OT Goals OT Goal: Miscellaneous Goal #1 - Progress: Progressing toward goals OT Goal: Miscellaneous Goal #2 - Progress: Progressing toward goals  Visit Information  Last OT Received On: 08/23/12 Assistance Needed: +1    Subjective Data  Subjective: This is the first time I have finished with my exercises and my arm has not felt like it was on fire   Prior Functioning       Cognition  Overall Cognitive Status: Appears within functional limits for tasks assessed/performed Arousal/Alertness: Awake/alert Orientation Level: Appears intact for tasks assessed Behavior During Session: [facility] for tasks performed    Mobility  Shoulder Instructions Transfers Transfers: Sit to Stand;Stand to Sit Sit to Stand: 7: Independent;Without upper extremity assist;From chair/3-in-1 Stand to Sit: 6: Modified independent (Device/Increase time);Without upper extremity assist;To bed       Exercises  Other Exercises Other  Exercises: Wife came in with shoulder pulley system for her husband. I put it together and placed over bathroom door (I cannot get it to sit all the way down so called facilites to see if they can). Held door so that pt could do his exercises with the pulley. Pt completed a total of 40 repetitions in 3 different planes by moving his chair from cented to the pulley system to more right of the pulley system. Tolerated this extremely well. Also had him work on internal and external rotation while up in the pulley system. Other Exercises: Pt at a Supervision level for pulley exercises with cues to keep thumb up   Balance     End of Session OT - End of Session Equipment Utilized During Treatment:  (Pulley system) Activity Tolerance: Patient tolerated treatment well Patient left: in bed;with call bell/phone within reach;with family/visitor present (wife)       Evette Georges [phone] 08/23/2012, 4:29 PM

## 2012-08-23 NOTE — Progress Notes (Signed)
Ian [name]  MRN: [id number] DOB/Age: 1961-03-17 50 y.o. Physician: Jacquelyne Balint Procedure: Procedure(s) (LRB): ARTHROSCOPY SHOULDER (Left)     Subjective: States shoulder actually feels better today, had good session with OT.  Has better color back.   Vital Signs Temp:  [97 F (36.1 C)-98.6 F (37 C)] 98.6 F (37 C) (08/30 0218) Pulse Rate:  [73-86] 80  (08/30 0218) Resp:  [10-20] 20  (08/30 0218) BP: (134-150)/(71-87) 148/79 mmHg (08/30 0218) SpO2:  [94 %-99 %] 98 % (08/30 0218)  Lab Results  Basename 08/21/12 1928  WBC 9.3  HGB 15.5  HCT 44.5  PLT 224   BMET  Basename 08/21/12 1928  NA 136  K 4.5  CL 98  CO2 28  GLUCOSE 122*  BUN 12  CREATININE 0.98  CALCIUM 10.2   No results found for this basename: inr     Exam Left shoulder dressing changed. The previous erythema at portal site has improved.        Plan Cultures still remain negative to date Will continue to follow to make final determination on IV abx Appreciate Dr. Clinton Gallant expertise Discussed pt with OT, they will get him a pulley system Will see him over the weekend  [facility] for Dr.Kevin Supple 08/23/2012, 9:19 AM

## 2012-08-23 NOTE — Progress Notes (Signed)
Occupational Therapy Evaluation Patient Details Name: Ian [name] MRN: [id number] DOB: 1961/02/20 Today's Date: 08/23/2012 Time: 7829-5621 OT Time Calculation (min): 33 min  OT Assessment / Plan / Recommendation Clinical Impression  Pt s/p left shoulder synovectomy, lavage G-H joint, bursectomy, removal retained suture and suture anchors debridement recurrent rotator cuff tear.  Pt is 6 weeks s/p left rotator cuff repair and has been progressing well with OP therapies.  Will benefit from acute  OT services to continue progressing shoulder rehab and ensure independence with HEP.      OT Assessment  Patient needs continued OT Services    Follow Up Recommendations  Outpatient OT    Barriers to Discharge      Equipment Recommendations  None recommended by OT    Recommendations for Other Services    Frequency  Min 2X/week    Precautions / Restrictions Precautions Precautions: Shoulder Type of Shoulder Precautions: AROM/ AAROM within pain limits Required Braces or Orthoses:  (LUE sling)   Pertinent Vitals/Pain See vitals    ADL  Upper Body Dressing: Performed;Minimal assistance Where Assessed - Upper Body Dressing: Unsupported sitting Toilet Transfer: Simulated;Modified independent Toilet Transfer Method: Sit to stand (ambulating) Acupuncturist:  (chair) Transfers/Ambulation Related to ADLs: Mod I for increased time and safety with IV pole ADL Comments: Pt demonstrates correct sling positioning.     OT Diagnosis: Generalized weakness;Acute pain  OT Problem List: Decreased strength;Decreased range of motion;Impaired UE functional use;Pain OT Treatment Interventions: Therapeutic exercise;Patient/family education   OT Goals Acute Rehab OT Goals OT Goal Formulation: With patient Time For Goal Achievement: 08/30/12 Potential to Achieve Goals: Good Miscellaneous OT Goals Miscellaneous OT Goal #1: Pt will independently perform pulley exercises with wife independent  assisting with setup. OT Goal: Miscellaneous Goal #1 - Progress: Goal set today Miscellaneous OT Goal #2: Pt will independently demonstrate correct LUE anatomical positioning during ROM exercises. OT Goal: Miscellaneous Goal #2 - Progress: Goal set today Miscellaneous OT Goal #3: Pt will independently perform shoulder AROM/AAROM HEP. OT Goal: Miscellaneous Goal #3 - Progress: Goal set today  Visit Information  Last OT Received On: 08/23/12 Assistance Needed: +1    Subjective Data      Prior Functioning  Vision/Perception  Home Living Lives With: Spouse Available Help at Discharge: Available PRN/intermittently;Family Type of Home: House Bathroom Shower/Tub: Engineer, manufacturing systems: Standard Prior Function Level of Independence: Independent Vocation:  (owns Youth worker business) Comments: Pt's wife has been independent with providing min assist to pt over past several weeks since L rotator cuff pair.  Pt requires min assist during bathing and occasionally during dressing. Communication Communication: No difficulties Dominant Hand: Right      Cognition  Overall Cognitive Status: Appears within functional limits for tasks assessed/performed Arousal/Alertness: Awake/alert Orientation Level: Appears intact for tasks assessed Behavior During Session: [facility] for tasks performed    Extremity/Trunk Assessment Right Upper Extremity Assessment RUE ROM/Strength/Tone: Within functional levels Left Upper Extremity Assessment LUE ROM/Strength/Tone: Deficits LUE ROM/Strength/Tone Deficits: AAROM shoulder flexion ~120, abduction 90, external rotation 30.  Pt reports feeling stiff but not terribly limited by pain.  Digit, wrist and elbow WFL.   Mobility  Shoulder Instructions  Bed Mobility Bed Mobility: Not assessed Details for Bed Mobility Assistance: Pt up in chair upon OT arrival. Transfers Transfers: Sit to Stand;Stand to Sit Sit to Stand: 6: Modified independent  (Device/Increase time);From chair/3-in-1;With armrests;With upper extremity assist Stand to Sit: 6: Modified independent (Device/Increase time);To chair/3-in-1;With armrests;With upper extremity assist Details for Transfer  Assistance: Demonstrates good safety awareness   Donning/doffing shirt without moving shoulder: Minimal assistance;Patient able to independently direct caregiver (pt is allowed to move shoulder) ROM for elbow, wrist and digits of operated UE: Independent   Exercise Shoulder Exercises Pendulum Exercise: AROM;Left;10 reps;Standing Shoulder Flexion: AAROM;Left;20 reps;Seated Shoulder Extension: AAROM;Left;20 reps;Seated Shoulder ABduction: AAROM;Left;20 reps;Seated Shoulder External Rotation: AAROM;Left;20 reps;Seated Elbow Flexion: AROM;Left;10 reps;Seated Elbow Extension: AROM;Left;10 reps;Seated Wrist Flexion: AROM;Left;10 reps;Seated Wrist Extension: AROM;Left;10 reps;Seated Digit Composite Flexion: AROM;Left;10 reps;Seated Composite Extension: AROM;Left;10 reps;Seated   Balance     End of Session OT - End of Session Equipment Utilized During Treatment:  (L UE sling as needed) Activity Tolerance: Patient tolerated treatment well Patient left: in chair;with call bell/phone within reach;with family/visitor present;with nursing in room  GO    08/23/2012 Cipriano Mile OTR/L Pager [phone] Office [phone]  Cipriano Mile 08/23/2012, 12:43 PM

## 2012-08-23 NOTE — Progress Notes (Signed)
Occupational Therapy Treatment Patient Details Name: Ian [name] MRN: [id number] DOB: [birth year] Today's Date: 08/23/2012 Time: 1030-1040 OT Time Calculation (min): 10 min  OT Assessment / Plan / Recommendation Comments on Treatment Session Pt has started using pulley system    Follow Up Recommendations   (outpatient therapy per MD)    Barriers to Discharge       Equipment Recommendations  None recommended by OT    Recommendations for Other Services    Frequency Min 2X/week   Plan Discharge plan remains appropriate    Precautions / Restrictions Precautions Precautions: Shoulder Type of Shoulder Precautions: No ROM limitiations, just based off of patient tolerance Required Braces or Orthoses:  (LUE in sling) Restrictions Weight Bearing Restrictions: Yes LUE Weight Bearing: Non weight bearing   Pertinent Vitals/Pain C/o tightness in right shoulder exercises         OT Goals  Visit Information  Last OT Received On: 08/23/12 Assistance Needed: +1    Subjective Data  Subjective: I had been doing this at OP   Prior Functioning  Home Living    Cognition  Overall Cognitive Status: Appears within functional limits for tasks assessed/performed Arousal/Alertness: Awake/alert Orientation Level: Appears intact for tasks assessed Behavior During Session: [facility] for tasks performed       Exercises  Shoulder Exercises Other Exercises Other Exercises: Set up a temporary pulley system on pt's IV pole and pt performed 10 reps (with only c/o shoulder tightness). Wife is aware that she needs to hold the IV pole if pt does exercises again with this set up. Wife to go [employer] and purchase a pulley system for pt to use here and at home. WIll go back and set it up later today.   Balance     End of Session OT - End of Session Equipment Utilized During Treatment:  (Pulley system) Activity Tolerance: Patient tolerated treatment well Patient left: in chair        Evette Georges [phone] 08/23/2012, 1:37 PM

## 2012-08-24 NOTE — Progress Notes (Signed)
Occupational Therapy Treatment Patient Details Name: Ian [name] MRN: [id number] DOB: [birth year] Today's Date: 08/24/2012 Time: 0102-7253 OT Time Calculation (min): 43 min  OT Assessment / Plan / Recommendation Comments on Treatment Session Pt stated that he was "down in the dumps" today because he can not move his arm like he wants to. Educated pt on typical recovery process for procedure and pt appeared comforted. Pt making excellent gains in ROM, which is essentially functional with AAROM. Pt given additional exercises today to work on. Pt's wife expresses understanding of ex and need for frequent movement of arm. At end of session, ice to shoulder. Given handouts on exercises. Pt is having PIC line placed today, therefore, will need to be set up for HHOT. When pt has completted IV antibiotics, pt will benefit from outpt services.    Follow Up Recommendations  Home health OT    Barriers to Discharge   none    Equipment Recommendations  None recommended by OT    Recommendations for Other Services  none  Frequency Min 2X/week   Plan Discharge plan needs to be updated    Precautions / Restrictions Precautions Precautions: Shoulder Type of Shoulder Precautions: No ROM limitations, just based off of patient tolerance Precaution Booklet Issued: Yes (comment) Restrictions LUE Weight Bearing: Non weight bearing   Pertinent Vitals/Pain none    ADL  ADL Comments: Pt's wife states that she bathes her husband. Educated pt on improtance of using LUE as tolerated in functional tasks to increase ROM and strength. Pt/wife verbalized understanding.    OT Diagnosis:    OT Problem List:   OT Treatment Interventions:     OT Goals Acute Rehab OT Goals OT Goal Formulation: With patient Time For Goal Achievement: 08/30/12 Potential to Achieve Goals: Good Miscellaneous OT Goals Miscellaneous OT Goal #1: Pt will independently perform pulley exercises with wife independent assisting with  setup. OT Goal: Miscellaneous Goal #1 - Progress: Progressing toward goals Miscellaneous OT Goal #2: Pt will independently demonstrate correct LUE anatomical positioning during ROM exercises. OT Goal: Miscellaneous Goal #2 - Progress: Progressing toward goals Miscellaneous OT Goal #3: Pt will independently perform shoulder AROM/AAROM HEP. OT Goal: Miscellaneous Goal #3 - Progress: Progressing toward goals  Visit Information  Last OT Received On: 08/24/12    Subjective Data      Prior Functioning       Cognition  Overall Cognitive Status: Appears within functional limits for tasks assessed/performed Arousal/Alertness: Awake/alert Orientation Level: Appears intact for tasks assessed Behavior During Session: [facility] for tasks performed    Mobility  Shoulder Instructions Bed Mobility Bed Mobility: Supine to Sit Supine to Sit: 6: Modified independent (Device/Increase time) Transfers Transfers: Sit to Stand;Stand to Sit Sit to Stand: 7: Independent Stand to Sit: 7: Independent   Donning/doffing shirt without moving shoulder: Patient able to independently direct caregiver Method for sponge bathing under operated UE: Patient able to independently direct caregiver Donning/doffing sling/immobilizer: Patient able to independently direct caregiver   Exercises  Shoulder Exercises Pendulum Exercise: AROM;AAROM;Left;15 reps Shoulder Flexion: AAROM;Left;10 reps;Supine Shoulder Extension: AROM;AAROM;10 reps;Seated Shoulder ABduction: AAROM;AROM;Left;Seated Shoulder External Rotation: AROM;AAROM;10 reps;Seated Elbow Flexion: AROM Elbow Extension: AROM;Left;10 reps;Seated Wrist Flexion: AROM;Left;10 reps;Seated Wrist Extension: AROM;Left;10 reps;Seated Digit Composite Flexion: AROM;Left;10 reps;Seated Composite Extension: AROM;Left;10 reps;Seated Other Exercises Other Exercises: pulley system for FF, ext and PNF D1 - 2; ER amd ABD; horizontal adduction   Balance  WFL   End of Session  OT - End of Session Equipment Utilized During Treatment: Other (  comment) (pulley system) Activity Tolerance: Patient tolerated treatment well Patient left: in chair;with call bell/phone within reach;with family/visitor present  GO     Ian [name],HILLARY 08/24/2012, 12:42 PM [facility], OTR/L  [phone] 08/24/2012

## 2012-08-24 NOTE — Progress Notes (Signed)
Patient ID: Ian [name], male   DOB: 1961-05-05, 51 y.o.   MRN: [id number] Subjective: 2 Days Post-Op Procedure(s) (LRB): ARTHROSCOPY SHOULDER (Left)    Patient reports pain as moderate.  Feels depressed with current situation.  Would like to shower.  Objective:   VITALS:   Filed Vitals:   08/24/12 1404  BP: 139/74  Pulse: 84  Temp: 98.3 F (36.8 C)  Resp: 18    Incision: dressing C/D/I Minimal active abduction/forward flexion left shoulder Notes left shoulder stiffness otherwise - but working on it, pulley in room  [employer] 08/21/12 1928  HGB 15.5  HCT 44.5  WBC 9.3  PLT 224     Basename 08/21/12 1928  NA 136  K 4.5  BUN 12  CREATININE 0.98  GLUCOSE 122*    No results found for this basename: LABPT:2,INR:2 in the last 72 hours   Assessment/Plan: 2 Days Post-Op Procedure(s) (LRB): ARTHROSCOPY SHOULDER (Left)   Plan: May shower today Supposed to get [facility] line today Continue ABX per ID recs Reviewed patients concerns with him encouraged to try and take it day by day, step by step towards recovery.

## 2012-08-24 NOTE — Progress Notes (Signed)
[facility]   Subjective: No new complaints   Antibiotics:  Anti-infectives     Start     Dose/Rate Route Frequency Ordered Stop   08/22/12 0800   vancomycin (VANCOCIN) IVPB 1000 mg/200 mL premix        1,000 mg 200 mL/hr over 60 Minutes Intravenous Every 12 hours 08/21/12 1829     08/21/12 1930   vancomycin (VANCOCIN) 2,000 mg in sodium chloride 0.9 % 500 mL IVPB        2,000 mg 250 mL/hr over 120 Minutes Intravenous  Once 08/21/12 1829 08/21/12 2300          Medications: Scheduled Meds:    . docusate sodium  100 mg Oral BID  . enoxaparin (LOVENOX) injection  40 mg Subcutaneous Q24H  . loratadine  10 mg Oral Daily  . mupirocin ointment  1 application Nasal BID  . pantoprazole  40 mg Oral Q1200  . vancomycin  1,000 mg Intravenous Q12H   Continuous Infusions:    . lactated ringers 75 mL/hr at 08/23/12 0844  . lactated ringers 20 mL/hr (08/23/12 0848)   PRN Meds:.acetaminophen, acetaminophen, alum & mag hydroxide-simeth, bisacodyl, diphenhydrAMINE, HYDROcodone-acetaminophen, HYDROmorphone (DILAUDID) injection, menthol-cetylpyridinium, methocarbamol (ROBAXIN) IV, methocarbamol, metoCLOPramide (REGLAN) injection, metoCLOPramide, ondansetron (ZOFRAN) IV, ondansetron, phenol, temazepam   Objective: Weight change:   Intake/Output Summary (Last 24 hours) at 08/24/12 1118 Last data filed at 08/24/12 0616  Gross per 24 hour  Intake 2441.25 ml  Output      0 ml  Net 2441.25 ml   Blood pressure 149/85, pulse 76, temperature 97.9 F (36.6 C), temperature source Oral, resp. rate 20, height 5\' 9"  (1.753 m), weight 257 lb 0.9 oz (116.6 kg), SpO2 97.00%. Temp:  [97.9 F (36.6 C)-99 F (37.2 C)] 97.9 F (36.6 C) (08/31 0615) Pulse Rate:  [76-93] 76  (08/31 0615) Resp:  [20] 20  (08/31 0615) BP: (134-149)/(67-85) 149/85 mmHg (08/31 0615) SpO2:  [97 %-99 %] 97 % (08/31 0615)  Physical Exam: General: Alert and awake, oriented x3, not in any  acute distress.  Extremities: shoulder with diminished erythema, bandages in place  Skin: no rashes  Neuro: nonfocal  Lab Results:  [facility] 08/21/12 1928  WBC 9.3  HGB 15.5  HCT 44.5  PLT 224    BMET  Basename 08/21/12 1928  NA 136  K 4.5  CL 98  CO2 28  GLUCOSE 122*  BUN 12  CREATININE 0.98  CALCIUM 10.2    Micro Results: Recent Results (from the past 240 hour(s))  BODY FLUID CULTURE     Status: Normal (Preliminary result)   Collection Time   08/21/12  6:23 PM      Component Value Range Status Comment   Specimen Description FLUID SHOULDER LEFT   Final    Special Requests BURSAE FLUID GLENOHUMERAL JOINT FLUID ON SWABS   Final    Gram Stain     Final    Value: ABUNDANT WBC PRESENT,BOTH PMN AND MONONUCLEAR     NO ORGANISMS SEEN     Gram Stain Report Called to,Read Back By and Verified With: Gram Stain Report Called to,Read Back By and Verified With: DR SUPPLE 2057 08/21/12 BY A BROWNING   Culture NO GROWTH 1 DAY   Final    Report Status PENDING   Incomplete   ANAEROBIC CULTURE     Status: Normal (Preliminary result)   Collection Time   08/21/12  6:25 PM  Component Value Range Status Comment   Specimen Description FLUID SHOULDER LEFT   Final    Special Requests BURSAE FLUID GLENOHUMERAL JOINT FLUID ON SWABS   Final    Gram Stain     Final    Value: ABUNDANT WBC PRESENT,BOTH PMN AND MONONUCLEAR     NO ORGANISMS SEEN     Gram Stain Report Called to,Read Back By and Verified With: Gram Stain Report Called to,Read Back By and Verified With: DR SUPPLE 2057 08/21/12 BY A BROWNING   Culture     Final    Value: NO ANAEROBES ISOLATED; CULTURE IN PROGRESS FOR 5 DAYS   Report Status PENDING   Incomplete   GRAM STAIN     Status: Normal   Collection Time   08/21/12  6:25 PM      Component Value Range Status Comment   Specimen Description FLUID SHOULDER LEFT   Final    Special Requests BURSAE FLUID GLENOHUMERAL JOINT FLUID ON SWABS   Final    Gram Stain     Final     Value: ABUNDANT WBC PRESENT,BOTH PMN AND MONONUCLEAR     NO ORGANISMS SEEN     Gram Stain Report Called to,Read Back By and Verified With: DR SUPPLE 2057 08/21/12 A BROWNING   Report Status 08/21/2012 FINAL   Final   BODY FLUID CULTURE     Status: Normal (Preliminary result)   Collection Time   08/21/12  6:35 PM      Component Value Range Status Comment   Specimen Description FLUID SHOULDER LEFT   Final    Special Requests     Final    Value: GLENOHUMERAL ON SWABS LEFT GLENOHUMERAL FLUID ON SWABS BURSAE ON SWABS   Gram Stain     Final    Value: FEW WBC PRESENT,BOTH PMN AND MONONUCLEAR     NO ORGANISMS SEEN     Gram Stain Report Called to,Read Back By and Verified With: Gram Stain Report Called to,Read Back By and Verified With: DR SUPPLE 2057 08/21/12 BY A BROWNING Performed at [facility]   Culture NO GROWTH 1 DAY   Final    Report Status PENDING   Incomplete   ANAEROBIC CULTURE     Status: Normal (Preliminary result)   Collection Time   08/21/12  6:36 PM      Component Value Range Status Comment   Specimen Description FLUID SHOULDER LEFT   Final    Special Requests     Final    Value: GLENOHUMERAL ON SWABS LEFT GLENOHUMERAL FLUID ON SWABS BURSAE FLUID ON SWABS   Gram Stain     Final    Value: FEW WBC PRESENT,BOTH PMN AND MONONUCLEAR     NO ORGANISMS SEEN     Gram Stain Report Called to,Read Back By and Verified With: Gram Stain Report Called to,Read Back By and Verified With: DR SUPPLE 2057 08/21/12 BY A BROWNING Performed at [facility]   Culture     Final    Value: NO ANAEROBES ISOLATED; CULTURE IN PROGRESS FOR 5 DAYS   Report Status PENDING   Incomplete   GRAM STAIN     Status: Normal   Collection Time   08/21/12  6:36 PM      Component Value Range Status Comment   Specimen Description FLUID SHOULDER LEFT   Final    Special Requests GLENOHUMERAL SWABS BURSAE FLUID ON SWABS   Final    Gram Stain  Final    Value: FEW WBC PRESENT,BOTH PMN AND MONONUCLEAR     NO  ORGANISMS SEEN     Gram Stain Report Called to,Read Back By and Verified With: DR SUPPLE 2057 08/21/12 A BROWNING   Report Status 08/21/2012 FINAL   Final   SURGICAL PCR SCREEN     Status: Abnormal   Collection Time   08/21/12  6:44 PM      Component Value Range Status Comment   MRSA, PCR NEGATIVE  NEGATIVE Final    Staphylococcus aureus POSITIVE (*) NEGATIVE Final   URINE CULTURE     Status: Normal   Collection Time   08/21/12  9:13 PM      Component Value Range Status Comment   Specimen Description URINE, CLEAN CATCH   Final    Special Requests Normal   Final    Culture  Setup Time 08/21/2012 22:00   Final    Colony Count NO GROWTH   Final    Culture NO GROWTH   Final    Report Status 08/22/2012 FINAL   Final     Studies/Results: No results found.    Assessment/Plan: Ian [name] is a 51 y.o. 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

## 2012-08-24 NOTE — Progress Notes (Signed)
ANTIBIOTIC CONSULT NOTE - Follow-Up  Pharmacy Consult for Vancomycin Indication: shoulder infection  Allergies  Allergen Reactions  . Asa (Aspirin) Nausea Only and Rash  . Penicillins Rash  . Pork-Derived Products Diarrhea and Nausea And Vomiting  . Tetanus Toxoids Anaphylaxis  . Beef-Derived Products Nausea And Vomiting    "don't digest beef very well"    Patient Measurements: Height: 5\' 9"  (175.3 cm) Weight: 257 lb 0.9 oz (116.6 kg) IBW/kg (Calculated) : 70.7   Labs: Estimated Creatinine Clearance: 111.1 ml/min (by C-G formula based on Cr of 0.98).  Microbiology: Recent Results (from the past 720 hour(s))  BODY FLUID CULTURE     Status: Normal (Preliminary result)   Collection Time   08/21/12  6:23 PM      Component Value Range Status Comment   Specimen Description FLUID SHOULDER LEFT   Final    Special Requests BURSAE FLUID GLENOHUMERAL JOINT FLUID ON SWABS   Final    Gram Stain     Final    Value: ABUNDANT WBC PRESENT,BOTH PMN AND MONONUCLEAR     NO ORGANISMS SEEN     Gram Stain Report Called to,Read Back By and Verified With: Gram Stain Report Called to,Read Back By and Verified With: DR SUPPLE 2057 08/21/12 BY A BROWNING   Culture NO GROWTH 1 DAY   Final    Report Status PENDING   Incomplete   ANAEROBIC CULTURE     Status: Normal (Preliminary result)   Collection Time   08/21/12  6:25 PM      Component Value Range Status Comment   Specimen Description FLUID SHOULDER LEFT   Final    Special Requests BURSAE FLUID GLENOHUMERAL JOINT FLUID ON SWABS   Final    Gram Stain     Final    Value: ABUNDANT WBC PRESENT,BOTH PMN AND MONONUCLEAR     NO ORGANISMS SEEN     Gram Stain Report Called to,Read Back By and Verified With: Gram Stain Report Called to,Read Back By and Verified With: DR SUPPLE 2057 08/21/12 BY A BROWNING   Culture     Final    Value: NO ANAEROBES ISOLATED; CULTURE IN PROGRESS FOR 5 DAYS   Report Status PENDING   Incomplete   GRAM STAIN     Status: Normal   Collection Time   08/21/12  6:25 PM      Component Value Range Status Comment   Specimen Description FLUID SHOULDER LEFT   Final    Special Requests BURSAE FLUID GLENOHUMERAL JOINT FLUID ON SWABS   Final    Gram Stain     Final    Value: ABUNDANT WBC PRESENT,BOTH PMN AND MONONUCLEAR     NO ORGANISMS SEEN     Gram Stain Report Called to,Read Back By and Verified With: DR SUPPLE 2057 08/21/12 A BROWNING   Report Status 08/21/2012 FINAL   Final   BODY FLUID CULTURE     Status: Normal (Preliminary result)   Collection Time   08/21/12  6:35 PM      Component Value Range Status Comment   Specimen Description FLUID SHOULDER LEFT   Final    Special Requests     Final    Value: GLENOHUMERAL ON SWABS LEFT GLENOHUMERAL FLUID ON SWABS BURSAE ON SWABS   Gram Stain     Final    Value: FEW WBC PRESENT,BOTH PMN AND MONONUCLEAR     NO ORGANISMS SEEN     Gram Stain Report Called to,Read Back By and  Verified With: Gram Stain Report Called to,Read Back By and Verified With: DR SUPPLE 2057 08/21/12 BY A BROWNING Performed at [facility]   Culture NO GROWTH 1 DAY   Final    Report Status PENDING   Incomplete   ANAEROBIC CULTURE     Status: Normal (Preliminary result)   Collection Time   08/21/12  6:36 PM      Component Value Range Status Comment   Specimen Description FLUID SHOULDER LEFT   Final    Special Requests     Final    Value: GLENOHUMERAL ON SWABS LEFT GLENOHUMERAL FLUID ON SWABS BURSAE FLUID ON SWABS   Gram Stain     Final    Value: FEW WBC PRESENT,BOTH PMN AND MONONUCLEAR     NO ORGANISMS SEEN     Gram Stain Report Called to,Read Back By and Verified With: Gram Stain Report Called to,Read Back By and Verified With: DR SUPPLE 2057 08/21/12 BY A BROWNING Performed at [facility]   Culture     Final    Value: NO ANAEROBES ISOLATED; CULTURE IN PROGRESS FOR 5 DAYS   Report Status PENDING   Incomplete   GRAM STAIN     Status: Normal   Collection Time   08/21/12  6:36 PM      Component  Value Range Status Comment   Specimen Description FLUID SHOULDER LEFT   Final    Special Requests GLENOHUMERAL SWABS BURSAE FLUID ON SWABS   Final    Gram Stain     Final    Value: FEW WBC PRESENT,BOTH PMN AND MONONUCLEAR     NO ORGANISMS SEEN     Gram Stain Report Called to,Read Back By and Verified With: DR SUPPLE 2057 08/21/12 A BROWNING   Report Status 08/21/2012 FINAL   Final   SURGICAL PCR SCREEN     Status: Abnormal   Collection Time   08/21/12  6:44 PM      Component Value Range Status Comment   MRSA, PCR NEGATIVE  NEGATIVE Final    Staphylococcus aureus POSITIVE (*) NEGATIVE Final   URINE CULTURE     Status: Normal   Collection Time   08/21/12  9:13 PM      Component Value Range Status Comment   Specimen Description URINE, CLEAN CATCH   Final    Special Requests Normal   Final    Culture  Setup Time 08/21/2012 22:00   Final    Colony Count NO GROWTH   Final    Culture NO GROWTH   Final    Report Status 08/22/2012 FINAL   Final     Assessment: 51 y.o. Male on Vancomycin Day #4 for presumed septic shoulder. S/p I&D and removal of hardware on 8/30. ID MD following. If no organism isolated, plan for 6 weeks IV Vanco (stop date 10/10) with goal trough 15-20 mcg/ml. All cultures negative so far. Afeb. Wbc wnl.  Goal of Therapy:  Vancomycin trough level 15-20 mcg/ml  Plan:  1) Continue Vancomycin 1000 mg iv Q 12 hours 2) Will check Vancomycin trough tomorrow a.m. 3) Follow renal function, cultures  Christoper Fabian, PharmD, BCPS Clinical pharmacist, pager [phone] 08/24/2012,12:36 PM

## 2012-08-25 LAB — BODY FLUID CULTURE
Culture: NO GROWTH
Culture: NO GROWTH

## 2012-08-25 LAB — VANCOMYCIN, TROUGH: Vancomycin Tr: 5.4 ug/mL — ABNORMAL LOW (ref 10.0–20.0)

## 2012-08-25 MED ORDER — SODIUM CHLORIDE 0.9 % IJ SOLN: 10.0000 mL | Freq: Two times a day (BID) | INTRAMUSCULAR | Status: DC

## 2012-08-25 MED ORDER — SODIUM CHLORIDE 0.9 % IV SOLN
1250.0000 mg | Freq: Three times a day (TID) | INTRAVENOUS | Status: DC
  Administered 2012-08-25 – 2012-08-26 (×3): 1250 mg via INTRAVENOUS
  Filled 2012-08-25 (×5): qty 1250

## 2012-08-25 MED ORDER — SODIUM CHLORIDE 0.9 % IJ SOLN
10.0000 mL | INTRAMUSCULAR | Status: DC | PRN
  Administered 2012-08-26: 10 mL

## 2012-08-25 MED ORDER — METHOCARBAMOL 500 MG PO TABS: 500.0000 mg | ORAL_TABLET | Freq: Four times a day (QID) | ORAL | Status: DC | PRN

## 2012-08-25 MED ORDER — HYDROCODONE-ACETAMINOPHEN 5-325 MG PO TABS: 1.0000 | ORAL_TABLET | ORAL | Status: DC | PRN

## 2012-08-25 NOTE — Progress Notes (Signed)
Case manager called to let me know she received call about home health needs. Will try to see patient today if possible.

## 2012-08-25 NOTE — Progress Notes (Signed)
Ian [name]  MRN: [id number] DOB/Age: [birth year] 51 y.o. Physician: Lynnea Maizes, M.D. 3 Days Post-Op Procedure(s) (LRB): ARTHROSCOPY SHOULDER (Left)  Subjective: Difficult night, C/O generalized muscle spasms after cold shower, states spasms have been a long term problem for him, responded to Robaxen Vital Signs Temp:  [98.3 F (36.8 C)-98.4 F (36.9 C)] 98.4 F (36.9 C) (09/01 0543) Pulse Rate:  [71-84] 71  (09/01 0543) Resp:  [18] 18  (09/01 0543) BP: (117-139)/(60-74) 125/68 mmHg (09/01 0543) SpO2:  [95 %-96 %] 96 % (09/01 0543)  Lab Results No results found for this basename: WBC:2,HGB:2,HCT:2,PLT:2 in the last 72 hours BMET No results found for this basename: NA:2,K:2,CL:2,CO2:2,GLUCOSE:2,BUN:2,CREATININE:2,CALCIUM:2 in the last 72 hours No results found for this basename: inr     Exam  Left shoulder portals all healing well without drainage. Uses pullies with good rom left shoulder  Cx remain no growth.  Plan Suspect chemical synovitis vs occult infection. Will empirically treat with 6 weeks ABx per ID recommendation. PICC line in place. Will ask for home health to make arrangements for home OT and IV therapy. D/C when arrangements made. F/U my office next Wednesday. Deiona Hooper M 08/25/2012, 10:00 AM

## 2012-08-25 NOTE — Progress Notes (Signed)
vanc trough not drawn at 07:30, lab notified, iv team is here to place picc line. Allen (iv team) will draw vanc trough. Pharmacy will be notified.

## 2012-08-25 NOTE — Progress Notes (Signed)
[facility]   Subjective: No new complaints   Antibiotics:  Anti-infectives     Start     Dose/Rate Route Frequency Ordered Stop   08/25/12 1030   vancomycin (VANCOCIN) 1,250 mg in sodium chloride 0.9 % 250 mL IVPB        1,250 mg 166.7 mL/hr over 90 Minutes Intravenous 3 times per day 08/25/12 1012     08/22/12 0800   vancomycin (VANCOCIN) IVPB 1000 mg/200 mL premix  Status:  Discontinued        1,000 mg 200 mL/hr over 60 Minutes Intravenous Every 12 hours 08/21/12 1829 08/25/12 1012   08/21/12 1930   vancomycin (VANCOCIN) 2,000 mg in sodium chloride 0.9 % 500 mL IVPB        2,000 mg 250 mL/hr over 120 Minutes Intravenous  Once 08/21/12 1829 08/21/12 2300          Medications: Scheduled Meds:    . docusate sodium  100 mg Oral BID  . enoxaparin (LOVENOX) injection  40 mg Subcutaneous Q24H  . loratadine  10 mg Oral Daily  . mupirocin ointment  1 application Nasal BID  . pantoprazole  40 mg Oral Q1200  . sodium chloride  10-40 mL Intracatheter Q12H  . vancomycin  1,250 mg Intravenous Q8H  . DISCONTD: vancomycin  1,000 mg Intravenous Q12H   Continuous Infusions:    . lactated ringers 75 mL/hr at 08/23/12 0844  . lactated ringers 20 mL/hr (08/25/12 1058)   PRN Meds:.acetaminophen, acetaminophen, alum & mag hydroxide-simeth, bisacodyl, diphenhydrAMINE, HYDROcodone-acetaminophen, HYDROmorphone (DILAUDID) injection, menthol-cetylpyridinium, methocarbamol (ROBAXIN) IV, methocarbamol, metoCLOPramide (REGLAN) injection, metoCLOPramide, ondansetron (ZOFRAN) IV, ondansetron, phenol, sodium chloride, temazepam   Objective: Weight change:   Intake/Output Summary (Last 24 hours) at 08/25/12 1624 Last data filed at 08/25/12 1500  Gross per 24 hour  Intake   2414 ml  Output      0 ml  Net   2414 ml   Blood pressure 123/71, pulse 79, temperature 99.5 F (37.5 C), temperature source Oral, resp. rate 18, height 5\' 9"  (1.753 m), weight 257 lb  0.9 oz (116.6 kg), SpO2 95.00%. Temp:  [98.3 F (36.8 C)-99.5 F (37.5 C)] 99.5 F (37.5 C) (09/01 1339) Pulse Rate:  [71-82] 79  (09/01 1339) Resp:  [18] 18  (09/01 1339) BP: (117-125)/(60-71) 123/71 mmHg (09/01 1339) SpO2:  [95 %-96 %] 95 % (09/01 1339)  Physical Exam: General: Alert and awake, oriented x3, not in any acute distress.  Extremities: shoulder with diminished erythema, bandages in place  Skin: no rashes  Neuro: nonfocal  Lab Results: No results found for this basename: WBC:2,HGB:2,HCT:2,PLT:2 in the last 72 hours  BMET No results found for this basename: NA:2,K:2,CL:2,CO2:2,GLUCOSE:2,BUN:2,CREATININE:2,CALCIUM:2 in the last 72 hours  Micro Results: Recent Results (from the past 240 hour(s))  BODY FLUID CULTURE     Status: Normal   Collection Time   08/21/12  6:23 PM      Component Value Range Status Comment   Specimen Description FLUID SHOULDER LEFT   Final    Special Requests BURSAE FLUID GLENOHUMERAL JOINT FLUID ON SWABS   Final    Gram Stain     Final    Value: ABUNDANT WBC PRESENT,BOTH PMN AND MONONUCLEAR     NO ORGANISMS SEEN     Gram Stain Report Called to,Read Back By and Verified With: Gram Stain Report Called to,Read Back By and Verified With: DR SUPPLE 2057 08/21/12 BY A BROWNING  Culture NO GROWTH 3 DAYS   Final    Report Status 08/25/2012 FINAL   Final   ANAEROBIC CULTURE     Status: Normal (Preliminary result)   Collection Time   08/21/12  6:25 PM      Component Value Range Status Comment   Specimen Description FLUID SHOULDER LEFT   Final    Special Requests BURSAE FLUID GLENOHUMERAL JOINT FLUID ON SWABS   Final    Gram Stain     Final    Value: ABUNDANT WBC PRESENT,BOTH PMN AND MONONUCLEAR     NO ORGANISMS SEEN     Gram Stain Report Called to,Read Back By and Verified With: Gram Stain Report Called to,Read Back By and Verified With: DR SUPPLE 2057 08/21/12 BY A BROWNING   Culture     Final    Value: NO ANAEROBES ISOLATED; CULTURE IN PROGRESS  FOR 5 DAYS   Report Status PENDING   Incomplete   GRAM STAIN     Status: Normal   Collection Time   08/21/12  6:25 PM      Component Value Range Status Comment   Specimen Description FLUID SHOULDER LEFT   Final    Special Requests BURSAE FLUID GLENOHUMERAL JOINT FLUID ON SWABS   Final    Gram Stain     Final    Value: ABUNDANT WBC PRESENT,BOTH PMN AND MONONUCLEAR     NO ORGANISMS SEEN     Gram Stain Report Called to,Read Back By and Verified With: DR SUPPLE 2057 08/21/12 A BROWNING   Report Status 08/21/2012 FINAL   Final   BODY FLUID CULTURE     Status: Normal   Collection Time   08/21/12  6:35 PM      Component Value Range Status Comment   Specimen Description FLUID SHOULDER LEFT   Final    Special Requests     Final    Value: GLENOHUMERAL ON SWABS LEFT GLENOHUMERAL FLUID ON SWABS BURSAE ON SWABS   Gram Stain     Final    Value: FEW WBC PRESENT,BOTH PMN AND MONONUCLEAR     NO ORGANISMS SEEN     Gram Stain Report Called to,Read Back By and Verified With: Gram Stain Report Called to,Read Back By and Verified With: DR SUPPLE 2057 08/21/12 BY A BROWNING Performed at [facility]   Culture NO GROWTH 3 DAYS   Final    Report Status 08/25/2012 FINAL   Final   ANAEROBIC CULTURE     Status: Normal (Preliminary result)   Collection Time   08/21/12  6:36 PM      Component Value Range Status Comment   Specimen Description FLUID SHOULDER LEFT   Final    Special Requests     Final    Value: GLENOHUMERAL ON SWABS LEFT GLENOHUMERAL FLUID ON SWABS BURSAE FLUID ON SWABS   Gram Stain     Final    Value: FEW WBC PRESENT,BOTH PMN AND MONONUCLEAR     NO ORGANISMS SEEN     Gram Stain Report Called to,Read Back By and Verified With: Gram Stain Report Called to,Read Back By and Verified With: DR SUPPLE 2057 08/21/12 BY A BROWNING Performed at [facility]   Culture     Final    Value: NO ANAEROBES ISOLATED; CULTURE IN PROGRESS FOR 5 DAYS   Report Status PENDING   Incomplete   GRAM STAIN      Status: Normal   Collection Time   08/21/12  6:36 PM      Component Value Range Status Comment   Specimen Description FLUID SHOULDER LEFT   Final    Special Requests GLENOHUMERAL SWABS BURSAE FLUID ON SWABS   Final    Gram Stain     Final    Value: FEW WBC PRESENT,BOTH PMN AND MONONUCLEAR     NO ORGANISMS SEEN     Gram Stain Report Called to,Read Back By and Verified With: DR SUPPLE 2057 08/21/12 A BROWNING   Report Status 08/21/2012 FINAL   Final   SURGICAL PCR SCREEN     Status: Abnormal   Collection Time   08/21/12  6:44 PM      Component Value Range Status Comment   MRSA, PCR NEGATIVE  NEGATIVE Final    Staphylococcus aureus POSITIVE (*) NEGATIVE Final   URINE CULTURE     Status: Normal   Collection Time   08/21/12  9:13 PM      Component Value Range Status Comment   Specimen Description URINE, CLEAN CATCH   Final    Special Requests Normal   Final    Culture  Setup Time 08/21/2012 22:00   Final    Colony Count NO GROWTH   Final    Culture NO GROWTH   Final    Report Status 08/22/2012 FINAL   Final     Studies/Results: No results found.    Assessment/Plan: Ian [name] is a 51 y.o. 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   LOS: 4 days   Acey Lav 08/25/2012, 4:24 PM

## 2012-08-25 NOTE — Progress Notes (Signed)
Occupational Therapy Treatment Patient Details Name: BHAVIN MONJARAZ MRN: [id number] DOB: 06-11-1961 Today's Date: 08/25/2012 Time: 1425-1510 OT Time Calculation (min): 45 min  OT Assessment / Plan / Recommendation Comments on Treatment Session Pt with improved ROM today in all planes. Beginning to increase strength with external rotation. Given additional exercizes. Pt beginning to use LUE in more functional tasks. Brighter spirits today. Will follow acutely.    Follow Up Recommendations  Home health OT    Barriers to Discharge       Equipment Recommendations  None recommended by OT    Recommendations for Other Services    Frequency Min 3X/week   Plan Discharge plan remains appropriate    Precautions / Restrictions Restrictions LUE Weight Bearing: Non weight bearing   Pertinent Vitals/Pain none    ADL  ADL Comments: Pt states that he bathed himself yesterday in the showerafter this therapist talked to him about increasing his independence with self care.     OT Diagnosis:    OT Problem List:   OT Treatment Interventions:     OT Goals Acute Rehab OT Goals OT Goal Formulation: With patient Time For Goal Achievement: 08/30/12 Potential to Achieve Goals: Good Miscellaneous OT Goals Miscellaneous OT Goal #1: Pt will independently perform pulley exercises with wife independent assisting with setup. OT Goal: Miscellaneous Goal #1 - Progress: Progressing toward goals Miscellaneous OT Goal #2: Pt will independently demonstrate correct LUE anatomical positioning during ROM exercises. OT Goal: Miscellaneous Goal #2 - Progress: Progressing toward goals Miscellaneous OT Goal #3: Pt will independently perform shoulder AROM/AAROM HEP. OT Goal: Miscellaneous Goal #3 - Progress: Progressing toward goals  Visit Information  Last OT Received On: 08/25/12    Subjective Data   I feel better today. Will you see me tomorrow?   Prior Functioning       Cognition  Overall Cognitive  Status: Appears within functional limits for tasks assessed/performed Arousal/Alertness: Awake/alert Orientation Level: Appears intact for tasks assessed Behavior During Session: [facility] for tasks performed    Mobility  Shoulder Instructions         Exercises  Shoulder Exercises Pendulum Exercise: AROM;AAROM;Left;15 reps;Standing Shoulder Flexion: AROM;AAROM;15 reps;Seated;Other (comment);Supine (pulley system) Shoulder Extension: AROM;AAROM;Left;15 reps;Seated (pulley) Shoulder ABduction: AROM;AAROM;Left;15 reps;Seated (pulley) Shoulder External Rotation: AROM;AAROM;15 reps;Seated (pulley) Elbow Flexion: AROM;AAROM;Left;15 reps;Supine;Seated Elbow Extension: AROM;AAROM;Left;10 reps;Supine;Seated Wrist Flexion: AROM;Left;15 reps Wrist Extension: AROM;Left;15 reps Digit Composite Flexion: AROM;Left;Squeeze ball Other Exercises Other Exercises: pulley system for FF, ext and PNF D1 - 2; ER amd ABD; horizontal adduction   Balance  WFL   End of Session OT - End of Session Equipment Utilized During Treatment: Other (comment) Activity Tolerance: Patient tolerated treatment well Patient left: in chair;with call bell/phone within reach;with family/visitor present  GO     Alper Guilmette,HILLARY 08/25/2012, 3:16 PM [facility], OTR/L  [phone] 08/25/2012

## 2012-08-25 NOTE — Progress Notes (Signed)
ANTIBIOTIC CONSULT NOTE - Follow-Up  Pharmacy Consult for Vancomycin Indication: shoulder infection  Allergies  Allergen Reactions  . Asa (Aspirin) Nausea Only and Rash  . Penicillins Rash  . Pork-Derived Products Diarrhea and Nausea And Vomiting  . Tetanus Toxoids Anaphylaxis  . Beef-Derived Products Nausea And Vomiting    "don't digest beef very well"    Patient Measurements: Height: 5\' 9"  (175.3 cm) Weight: 257 lb 0.9 oz (116.6 kg) IBW/kg (Calculated) : 70.7   Labs: Estimated Creatinine Clearance: 111.1 ml/min (by C-G formula based on Cr of 0.98).  Microbiology: Recent Results (from the past 720 hour(s))  BODY FLUID CULTURE     Status: Normal   Collection Time   08/21/12  6:23 PM      Component Value Range Status Comment   Specimen Description FLUID SHOULDER LEFT   Final    Special Requests BURSAE FLUID GLENOHUMERAL JOINT FLUID ON SWABS   Final    Gram Stain     Final    Value: ABUNDANT WBC PRESENT,BOTH PMN AND MONONUCLEAR     NO ORGANISMS SEEN     Gram Stain Report Called to,Read Back By and Verified With: Gram Stain Report Called to,Read Back By and Verified With: DR SUPPLE 2057 08/21/12 BY A BROWNING   Culture NO GROWTH 3 DAYS   Final    Report Status 08/25/2012 FINAL   Final   ANAEROBIC CULTURE     Status: Normal (Preliminary result)   Collection Time   08/21/12  6:25 PM      Component Value Range Status Comment   Specimen Description FLUID SHOULDER LEFT   Final    Special Requests BURSAE FLUID GLENOHUMERAL JOINT FLUID ON SWABS   Final    Gram Stain     Final    Value: ABUNDANT WBC PRESENT,BOTH PMN AND MONONUCLEAR     NO ORGANISMS SEEN     Gram Stain Report Called to,Read Back By and Verified With: Gram Stain Report Called to,Read Back By and Verified With: DR SUPPLE 2057 08/21/12 BY A BROWNING   Culture     Final    Value: NO ANAEROBES ISOLATED; CULTURE IN PROGRESS FOR 5 DAYS   Report Status PENDING   Incomplete   GRAM STAIN     Status: Normal   Collection Time     08/21/12  6:25 PM      Component Value Range Status Comment   Specimen Description FLUID SHOULDER LEFT   Final    Special Requests BURSAE FLUID GLENOHUMERAL JOINT FLUID ON SWABS   Final    Gram Stain     Final    Value: ABUNDANT WBC PRESENT,BOTH PMN AND MONONUCLEAR     NO ORGANISMS SEEN     Gram Stain Report Called to,Read Back By and Verified With: DR SUPPLE 2057 08/21/12 A BROWNING   Report Status 08/21/2012 FINAL   Final   BODY FLUID CULTURE     Status: Normal   Collection Time   08/21/12  6:35 PM      Component Value Range Status Comment   Specimen Description FLUID SHOULDER LEFT   Final    Special Requests     Final    Value: GLENOHUMERAL ON SWABS LEFT GLENOHUMERAL FLUID ON SWABS BURSAE ON SWABS   Gram Stain     Final    Value: FEW WBC PRESENT,BOTH PMN AND MONONUCLEAR     NO ORGANISMS SEEN     Gram Stain Report Called to,Read Back By and Verified  With: Gram Stain Report Called to,Read Back By and Verified With: DR SUPPLE 2057 08/21/12 BY A BROWNING Performed at [facility]   Culture NO GROWTH 3 DAYS   Final    Report Status 08/25/2012 FINAL   Final   ANAEROBIC CULTURE     Status: Normal (Preliminary result)   Collection Time   08/21/12  6:36 PM      Component Value Range Status Comment   Specimen Description FLUID SHOULDER LEFT   Final    Special Requests     Final    Value: GLENOHUMERAL ON SWABS LEFT GLENOHUMERAL FLUID ON SWABS BURSAE FLUID ON SWABS   Gram Stain     Final    Value: FEW WBC PRESENT,BOTH PMN AND MONONUCLEAR     NO ORGANISMS SEEN     Gram Stain Report Called to,Read Back By and Verified With: Gram Stain Report Called to,Read Back By and Verified With: DR SUPPLE 2057 08/21/12 BY A BROWNING Performed at [facility]   Culture     Final    Value: NO ANAEROBES ISOLATED; CULTURE IN PROGRESS FOR 5 DAYS   Report Status PENDING   Incomplete   GRAM STAIN     Status: Normal   Collection Time   08/21/12  6:36 PM      Component Value Range Status Comment    Specimen Description FLUID SHOULDER LEFT   Final    Special Requests GLENOHUMERAL SWABS BURSAE FLUID ON SWABS   Final    Gram Stain     Final    Value: FEW WBC PRESENT,BOTH PMN AND MONONUCLEAR     NO ORGANISMS SEEN     Gram Stain Report Called to,Read Back By and Verified With: DR SUPPLE 2057 08/21/12 A BROWNING   Report Status 08/21/2012 FINAL   Final   SURGICAL PCR SCREEN     Status: Abnormal   Collection Time   08/21/12  6:44 PM      Component Value Range Status Comment   MRSA, PCR NEGATIVE  NEGATIVE Final    Staphylococcus aureus POSITIVE (*) NEGATIVE Final   URINE CULTURE     Status: Normal   Collection Time   08/21/12  9:13 PM      Component Value Range Status Comment   Specimen Description URINE, CLEAN CATCH   Final    Special Requests Normal   Final    Culture  Setup Time 08/21/2012 22:00   Final    Colony Count NO GROWTH   Final    Culture NO GROWTH   Final    Report Status 08/22/2012 FINAL   Final     Assessment: 51 y.o. Male on Vancomycin Day #5 for presumed septic shoulder. S/p I&D and removal of hardware on 8/30. ID MD following. If no organism isolated, plan for 6 weeks IV Vanco (stop date 10/10) with goal trough 15-20 mcg/ml. All cultures negative. Afeb. Wbc wnl.  Vancomycin trough 5.4 (SUBtherapeutic) on 1gm IV q12h.   Goal of Therapy:  Vancomycin trough level 15-20 mcg/ml  Plan:  1) Change Vancomycin to 1250 mg IV Q 8 hours 2) Check trough at new Css  Christoper Fabian, PharmD, BCPS Clinical pharmacist, pager [phone] 08/25/2012,11:00 AM

## 2012-08-26 LAB — ANAEROBIC CULTURE

## 2012-08-26 MED ORDER — VANCOMYCIN HCL 1000 MG IV SOLR: 2000.0000 mg | Freq: Two times a day (BID) | INTRAVENOUS | Status: DC

## 2012-08-26 MED ORDER — VANCOMYCIN HCL 1000 MG IV SOLR
2000.0000 mg | Freq: Once | INTRAVENOUS | Status: AC
  Administered 2012-08-26: 2000 mg via INTRAVENOUS
  Filled 2012-08-26: qty 2000

## 2012-08-26 MED ORDER — VANCOMYCIN HCL 1000 MG IV SOLR: 1250.0000 mg | Freq: Three times a day (TID) | INTRAVENOUS | Status: DC

## 2012-08-26 MED ORDER — HEPARIN SOD (PORK) LOCK FLUSH 100 UNIT/ML IV SOLN
250.0000 [IU] | INTRAVENOUS | Status: AC | PRN
  Administered 2012-08-26: 250 [IU]

## 2012-08-26 MED ORDER — METHOCARBAMOL 500 MG PO TABS: 500.0000 mg | ORAL_TABLET | Freq: Four times a day (QID) | ORAL | Status: DC | PRN

## 2012-08-26 NOTE — Progress Notes (Addendum)
CARE MANAGEMENT NOTE 08/26/2012  Patient:  Ian [name], Ian [name]   Account Number:  [id number]  Date Initiated:  08/22/2012  Documentation initiated by:  Ronny Flurry  Subjective/Objective Assessment:   DX: Post op infection Left shoulder     Action/Plan:   I and D , IV Vancomycin   Anticipated DC Date:  08/26/2012   Anticipated DC Plan:  HOME W HOME HEALTH SERVICES      DC Planning Services  CM consult      [facility] Choice  HOME HEALTH   Choice offered to / List presented to:  C-1 Patient        HH arranged  HH-1 RN  HH-2 PT      Status of service:  Completed, signed off Medicare Important Message given?   (If response is "NO", the following Medicare IM given date fields will be blank) Date Medicare IM given:   Date Additional Medicare IM given:    Discharge Disposition:  HOME W HOME HEALTH SERVICES  Per UR Regulation:  Reviewed for med. necessity/level of care/duration of stay  If discussed at Long Length of Stay Meetings, dates discussed:    Comments:  08/26/12 0945 Vance Peper RN BSN Case Manager [phone] CM spoke with patient and wife. Choice for home health agency offered. Contacted Jodene Nam, [facility] liasion with referral. Asked patient's bedside ARN to contact Dr. Ainsley Spinner regarding possibly changing dose and time for Vanc, wife states having to administer a dose during the night will be challenging as she has to return to work. Her cell number is [phone].

## 2012-08-26 NOTE — Progress Notes (Signed)
Occupational Therapy Treatment Patient Details Name: NTHONY [name] MRN: [id number] DOB: [birth year] Today's Date: 08/26/2012 Time: 1330-1409 OT Time Calculation (min): 39 min  OT Assessment / Plan / Recommendation Comments on Treatment Session Improving dailey. Increased ext rotator strength today. Pt given witten HEP. Pt to f/u with HHOT, then will need outpatient therapy after D/C from [facility].    Follow Up Recommendations  Home health OT    Barriers to Discharge       Equipment Recommendations  None recommended by OT    Recommendations for Other Services    Frequency Min 3X/week   Plan Discharge plan remains appropriate    Precautions / Restrictions Restrictions LUE Weight Bearing: Non weight bearing   Pertinent Vitals/Pain None. "tightness"    ADL  ADL Comments: Pt increasing independence with ADL    OT Diagnosis:    OT Problem List:   OT Treatment Interventions:     OT Goals Acute Rehab OT Goals OT Goal Formulation: With patient Time For Goal Achievement: 08/30/12 Potential to Achieve Goals: Good Miscellaneous OT Goals Miscellaneous OT Goal #1: Pt will independently perform pulley exercises with wife independent assisting with setup. OT Goal: Miscellaneous Goal #1 - Progress: Met Miscellaneous OT Goal #2: Pt will independently demonstrate correct LUE anatomical positioning during ROM exercises. OT Goal: Miscellaneous Goal #2 - Progress: Met Miscellaneous OT Goal #3: Pt will independently perform shoulder AROM/AAROM HEP. OT Goal: Miscellaneous Goal #3 - Progress: Met  Visit Information  Last OT Received On: 08/26/12    Subjective Data      Prior Functioning    independent   Cognition       Mobility  Shoulder Instructions   independent      Exercises  Shoulder Exercises Pendulum Exercise: AROM;AAROM;Left;15 reps;Standing Shoulder Flexion: AROM;AAROM;15 reps;Seated;Other (comment);Supine Shoulder Extension: AROM;AAROM;Left;15 reps;Seated Shoulder  ABduction: AROM;AAROM;Left;15 reps;Seated Shoulder External Rotation: AROM;AAROM;15 reps;Seated Elbow Flexion: AROM;AAROM;Left;15 reps;Supine;Seated Elbow Extension: AROM;AAROM;Left;10 reps;Supine;Seated Wrist Flexion: AROM;Left;15 reps Wrist Extension: AROM;Left;15 reps Digit Composite Flexion: AROM;Left;Squeeze ball Composite Extension: AROM;Left;10 reps;Seated Other Exercises Other Exercises: pulley system for FF, ext and PNF D1 - 2; ER amd ABD; horizontal adduction Other Exercises: Pt at a Supervision level for pulley exercises with cues to keep thumb up   Balance     End of Session OT - End of Session Equipment Utilized During Treatment: Other (comment) Activity Tolerance: Patient tolerated treatment well Patient left: in bed;with call bell/phone within reach;with family/visitor present  GO     [name],HILLARY 08/26/2012, 2:12 PM [facility], OTR/L  [phone] 08/26/2012

## 2012-08-26 NOTE — Discharge Summary (Signed)
Suspect chemical/reactive synovitis of left shoulder related to implants used for recent rotator cuff repair

## 2012-08-26 NOTE — Progress Notes (Signed)
Spoke with RN Z who was inquiring on behalf of case manager about a BID dosing regimen for Vancomycin in preparation for discharge today. Recommend: Vanco 2g IV/12h. Give dose after lunch prior to discharge, then starting in am per home health schedule. Rayman Petrosian S. Merilynn Finland, PharmD, BCPS Clinical Staff Pharmacist Pager [phone]

## 2012-08-26 NOTE — Progress Notes (Signed)
Discharge home.

## 2012-08-26 NOTE — Discharge Summary (Signed)
  PATIENT ID:      Ian [name]  MRN:     [id number] DOB/AGE:    02/17/1961 / 51 y.o.     DISCHARGE SUMMARY  ADMISSION DATE:    08/21/2012 DISCHARGE DATE:   08/26/2012   ADMISSION DIAGNOSIS: Left infected shoulder left shoulder infection  (left shoulder infection)  DISCHARGE DIAGNOSIS:  left shoulder infection    ADDITIONAL DIAGNOSIS: Active Problems:  * No active hospital problems. *   Past Medical History  Diagnosis Date  . GERD (gastroesophageal reflux disease)   . Internal bleeding hemorrhoids 2012    "might bleed once/month; only when I strain"    PROCEDURE: Procedure(s): ARTHROSCOPY SHOULDER on 08/21/2012 - 08/22/2012  CONSULTS:     HISTORY:  See H&P in chart  HOSPITAL COURSE:  [name] is a 51 y.o. admitted on 08/21/2012 and found to have a diagnosis of left shoulder infection vs inflammatory reaction.  After appropriate laboratory studies were obtained  they were taken to the operating room on 08/21/2012 - 08/22/2012 and underwent Procedure(s): ARTHROSCOPY SHOULDER.   They were given perioperative antibiotics:  Anti-infectives     Start     Dose/Rate Route Frequency Ordered Stop   08/26/12 0000   sodium chloride 0.9 % SOLN 250 mL with vancomycin 1000 MG SOLR 1,250 mg  Status:  Discontinued        1,250 mg 166.7 mL/hr over 90 Minutes Intravenous Every 8 hours 08/26/12 0849 08/26/12    08/26/12 0000   sodium chloride 0.9 % SOLN 250 mL with vancomycin 1000 MG SOLR 1,250 mg        1,250 mg 166.7 mL/hr over 90 Minutes Intravenous Every 8 hours 08/26/12 0950     08/25/12 1030   vancomycin (VANCOCIN) 1,250 mg in sodium chloride 0.9 % 250 mL IVPB        1,250 mg 166.7 mL/hr over 90 Minutes Intravenous 3 times per day 08/25/12 1012     08/22/12 0800   vancomycin (VANCOCIN) IVPB 1000 mg/200 mL premix  Status:  Discontinued        1,000 mg 200 mL/hr over 60 Minutes Intravenous Every 12 hours 08/21/12 1829 08/25/12 1012   08/21/12 1930   vancomycin (VANCOCIN) 2,000 mg in  sodium chloride 0.9 % 500 mL IVPB        2,000 mg 250 mL/hr over 120 Minutes Intravenous  Once 08/21/12 1829 08/21/12 2300        . Blood products given:none Pt underwent the above procedure and tolerated it well. A consult with Dr. Daiva Eves in infectious disease was requested and he followed along with [place] in care of patient. The preop cultures and intra op pathology remained negative for obvious bacterial source. It was felt that despite negative cultures it was best to continue parenteral IV Vancomycin. His shoulder was symptomatically improved  Following removal of the implants and I&D.  The remainder of the hospital course was dedicated to strengthening.  His incisions were clean and dry and sutures were removed The patient was discharged on 4 Days Post-Op in  Good condition. He will be followed closely as outpatient By ourselves and Dr. Daiva Eves

## 2012-09-02 ENCOUNTER — Inpatient Hospital Stay (HOSPITAL_COMMUNITY): Payer: BC Managed Care – PPO | Admitting: Anesthesiology

## 2012-09-02 ENCOUNTER — Inpatient Hospital Stay (HOSPITAL_COMMUNITY)
Admission: AD | Admit: 2012-09-02 | Discharge: 2012-09-05 | DRG: 867 | Disposition: A | Discharge status: Home Health | Payer: BC Managed Care – PPO | Source: Ambulatory Visit | Attending: Orthopedic Surgery | Admitting: Orthopedic Surgery

## 2012-09-02 ENCOUNTER — Inpatient Hospital Stay (HOSPITAL_COMMUNITY): Payer: BC Managed Care – PPO

## 2012-09-02 ENCOUNTER — Encounter (HOSPITAL_COMMUNITY): Payer: Self-pay | Admitting: Anesthesiology

## 2012-09-02 ENCOUNTER — Encounter (HOSPITAL_COMMUNITY): Payer: Self-pay | Admitting: General Practice

## 2012-09-02 ENCOUNTER — Other Ambulatory Visit: Payer: Self-pay | Admitting: Orthopedic Surgery

## 2012-09-02 ENCOUNTER — Encounter (HOSPITAL_COMMUNITY)
Admission: AD | Disposition: A | Discharge status: Home Health | Payer: Self-pay | Source: Ambulatory Visit | Attending: Orthopedic Surgery

## 2012-09-02 DIAGNOSIS — Z880 Allergy status to penicillin: Secondary | ICD-10-CM

## 2012-09-02 DIAGNOSIS — K219 Gastro-esophageal reflux disease without esophagitis: Secondary | ICD-10-CM | POA: Diagnosis present

## 2012-09-02 DIAGNOSIS — IMO0001 Reserved for inherently not codable concepts without codable children: Secondary | ICD-10-CM | POA: Diagnosis present

## 2012-09-02 DIAGNOSIS — M009 Pyogenic arthritis, unspecified: Principal | ICD-10-CM | POA: Diagnosis present

## 2012-09-02 DIAGNOSIS — Z9889 Other specified postprocedural states: Secondary | ICD-10-CM

## 2012-09-02 HISTORY — PX: SHOULDER ARTHROSCOPY: SHX128

## 2012-09-02 LAB — CBC
MCH: 30.1 pg (ref 26.0–34.0)
MCHC: 34.4 g/dL (ref 30.0–36.0)
MCV: 87.5 fL (ref 78.0–100.0)
Platelets: 159 10*3/uL (ref 150–400)
RDW: 12 % (ref 11.5–15.5)

## 2012-09-02 LAB — DIFFERENTIAL
Basophils Absolute: 0 10*3/uL (ref 0.0–0.1)
Lymphocytes Relative: 34 % (ref 12–46)
Lymphs Abs: 1 10*3/uL (ref 0.7–4.0)
Monocytes Absolute: 0.5 10*3/uL (ref 0.1–1.0)
Neutro Abs: 1.5 10*3/uL — ABNORMAL LOW (ref 1.7–7.7)

## 2012-09-02 LAB — COMPREHENSIVE METABOLIC PANEL
AST: 23 U/L (ref 0–37)
Albumin: 2.7 g/dL — ABNORMAL LOW (ref 3.5–5.2)
CO2: 25 mEq/L (ref 19–32)
Calcium: 8.8 mg/dL (ref 8.4–10.5)
Creatinine, Ser: 0.92 mg/dL (ref 0.50–1.35)
GFR calc non Af Amer: >90 mL/min (ref >90)
Total Protein: 5.9 g/dL — ABNORMAL LOW (ref 6.0–8.3)

## 2012-09-02 SURGERY — ARTHROSCOPY, SHOULDER
Anesthesia: General | Site: Shoulder | Laterality: Left | Wound class: Dirty or Infected

## 2012-09-02 MED ORDER — FENTANYL CITRATE 0.05 MG/ML IJ SOLN
INTRAMUSCULAR | Status: DC | PRN
  Administered 2012-09-02: 50 ug via INTRAVENOUS
  Administered 2012-09-02: 150 ug via INTRAVENOUS
  Administered 2012-09-02: 50 ug via INTRAVENOUS

## 2012-09-02 MED ORDER — OXYCODONE-ACETAMINOPHEN 5-325 MG PO TABS
1.0000 | ORAL_TABLET | ORAL | Status: DC | PRN
  Administered 2012-09-03 – 2012-09-04 (×4): 2 via ORAL
  Filled 2012-09-02 (×4): qty 2

## 2012-09-02 MED ORDER — SUCCINYLCHOLINE CHLORIDE 20 MG/ML IJ SOLN
INTRAMUSCULAR | Status: DC | PRN
  Administered 2012-09-02: 100 mg via INTRAVENOUS

## 2012-09-02 MED ORDER — PROPOFOL 10 MG/ML IV BOLUS
INTRAVENOUS | Status: DC | PRN
  Administered 2012-09-02: 200 mg via INTRAVENOUS

## 2012-09-02 MED ORDER — BISACODYL 10 MG RE SUPP: 10.0000 mg | Freq: Every day | RECTAL | Status: DC | PRN

## 2012-09-02 MED ORDER — EPHEDRINE SULFATE 50 MG/ML IJ SOLN
INTRAMUSCULAR | Status: DC | PRN
  Administered 2012-09-02: 15 mg via INTRAVENOUS
  Administered 2012-09-02: 10 mg via INTRAVENOUS

## 2012-09-02 MED ORDER — LACTATED RINGERS IV SOLN
INTRAVENOUS | Status: DC
  Administered 2012-09-02 – 2012-09-03 (×2): via INTRAVENOUS

## 2012-09-02 MED ORDER — CHLORHEXIDINE GLUCONATE 4 % EX LIQD
60.0000 mL | Freq: Once | CUTANEOUS | Status: DC
  Filled 2012-09-02: qty 60

## 2012-09-02 MED ORDER — DAPTOMYCIN 500 MG IV SOLR
6.0000 mg/kg | INTRAVENOUS | Status: DC
  Administered 2012-09-02 – 2012-09-03 (×2): 699.5 mg via INTRAVENOUS
  Filled 2012-09-02 (×3): qty 13.99

## 2012-09-02 MED ORDER — PHENOL 1.4 % MT LIQD: 1.0000 | OROMUCOSAL | Status: DC | PRN

## 2012-09-02 MED ORDER — MIDAZOLAM HCL 5 MG/5ML IJ SOLN
INTRAMUSCULAR | Status: DC | PRN
  Administered 2012-09-02: 2 mg via INTRAVENOUS

## 2012-09-02 MED ORDER — PANTOPRAZOLE SODIUM 40 MG PO TBEC
40.0000 mg | DELAYED_RELEASE_TABLET | Freq: Every day | ORAL | Status: DC
  Administered 2012-09-03 – 2012-09-05 (×3): 40 mg via ORAL
  Filled 2012-09-02 (×3): qty 1

## 2012-09-02 MED ORDER — ACETAMINOPHEN 650 MG RE SUPP: 650.0000 mg | Freq: Four times a day (QID) | RECTAL | Status: DC | PRN

## 2012-09-02 MED ORDER — LIDOCAINE HCL (CARDIAC) 20 MG/ML IV SOLN
INTRAVENOUS | Status: DC | PRN
  Administered 2012-09-02: 80 mg via INTRAVENOUS

## 2012-09-02 MED ORDER — LACTATED RINGERS IV SOLN
INTRAVENOUS | Status: DC | PRN
  Administered 2012-09-02 (×2): via INTRAVENOUS

## 2012-09-02 MED ORDER — MENTHOL 3 MG MT LOZG: 1.0000 | LOZENGE | OROMUCOSAL | Status: DC | PRN

## 2012-09-02 MED ORDER — HYDROMORPHONE HCL PF 1 MG/ML IJ SOLN
INTRAMUSCULAR | Status: AC
  Filled 2012-09-02: qty 1

## 2012-09-02 MED ORDER — LORATADINE 10 MG PO TABS
10.0000 mg | ORAL_TABLET | Freq: Every day | ORAL | Status: DC
Start: 2012-09-02 — End: 2012-09-05
  Administered 2012-09-03 – 2012-09-05 (×3): 10 mg via ORAL
  Filled 2012-09-02 (×4): qty 1

## 2012-09-02 MED ORDER — ONDANSETRON HCL 4 MG/2ML IJ SOLN
INTRAMUSCULAR | Status: AC
Start: 2012-09-02 — End: 2012-09-03
  Filled 2012-09-02: qty 2

## 2012-09-02 MED ORDER — ONDANSETRON HCL 4 MG/2ML IJ SOLN: 4.0000 mg | Freq: Four times a day (QID) | INTRAMUSCULAR | Status: DC | PRN

## 2012-09-02 MED ORDER — LACTATED RINGERS IV SOLN
INTRAVENOUS | Status: DC
  Administered 2012-09-02: 17:00:00 via INTRAVENOUS

## 2012-09-02 MED ORDER — HYDROMORPHONE HCL PF 1 MG/ML IJ SOLN: 0.5000 mg | INTRAMUSCULAR | Status: DC | PRN

## 2012-09-02 MED ORDER — HYDROMORPHONE HCL PF 1 MG/ML IJ SOLN
0.2500 mg | INTRAMUSCULAR | Status: DC | PRN
  Administered 2012-09-02: 0.5 mg via INTRAVENOUS

## 2012-09-02 MED ORDER — METHOCARBAMOL 500 MG PO TABS
500.0000 mg | ORAL_TABLET | Freq: Four times a day (QID) | ORAL | Status: DC | PRN
  Administered 2012-09-03 – 2012-09-04 (×4): 500 mg via ORAL
  Filled 2012-09-02 (×4): qty 1

## 2012-09-02 MED ORDER — METHOCARBAMOL 100 MG/ML IJ SOLN
500.0000 mg | Freq: Four times a day (QID) | INTRAVENOUS | Status: DC | PRN
  Filled 2012-09-02: qty 5

## 2012-09-02 MED ORDER — FLEET ENEMA 7-19 GM/118ML RE ENEM: 1.0000 | ENEMA | Freq: Once | RECTAL | Status: AC | PRN

## 2012-09-02 MED ORDER — PHENYLEPHRINE HCL 10 MG/ML IJ SOLN
INTRAMUSCULAR | Status: DC | PRN
  Administered 2012-09-02 (×2): 80 ug via INTRAVENOUS
  Administered 2012-09-02: 240 ug via INTRAVENOUS
  Administered 2012-09-02: 80 ug via INTRAVENOUS

## 2012-09-02 MED ORDER — ENOXAPARIN SODIUM 40 MG/0.4ML ~~LOC~~ SOLN
40.0000 mg | SUBCUTANEOUS | Status: DC
  Administered 2012-09-03 – 2012-09-05 (×3): 40 mg via SUBCUTANEOUS
  Filled 2012-09-02 (×4): qty 0.4

## 2012-09-02 MED ORDER — ONDANSETRON HCL 4 MG/2ML IJ SOLN
4.0000 mg | Freq: Four times a day (QID) | INTRAMUSCULAR | Status: AC | PRN
  Administered 2012-09-02: 4 mg via INTRAVENOUS

## 2012-09-02 MED ORDER — METOCLOPRAMIDE HCL 5 MG/ML IJ SOLN: 5.0000 mg | Freq: Three times a day (TID) | INTRAMUSCULAR | Status: DC | PRN

## 2012-09-02 MED ORDER — METOCLOPRAMIDE HCL 10 MG PO TABS: 5.0000 mg | ORAL_TABLET | Freq: Three times a day (TID) | ORAL | Status: DC | PRN

## 2012-09-02 MED ORDER — DOCUSATE SODIUM 100 MG PO CAPS
100.0000 mg | ORAL_CAPSULE | Freq: Two times a day (BID) | ORAL | Status: DC
  Administered 2012-09-02 – 2012-09-05 (×6): 100 mg via ORAL
  Filled 2012-09-02 (×6): qty 1

## 2012-09-02 MED ORDER — ONDANSETRON HCL 4 MG PO TABS: 4.0000 mg | ORAL_TABLET | Freq: Four times a day (QID) | ORAL | Status: DC | PRN

## 2012-09-02 MED ORDER — ACETAMINOPHEN 325 MG PO TABS: 650.0000 mg | ORAL_TABLET | Freq: Four times a day (QID) | ORAL | Status: DC | PRN

## 2012-09-02 SURGICAL SUPPLY — 50 items
BLADE CUTTER GATOR 3.5 (BLADE) ×2 IMPLANT
BLADE GREAT WHITE 4.2 (BLADE) ×2 IMPLANT
BLADE SURG 11 STRL SS (BLADE) ×2 IMPLANT
BOOTCOVER CLEANROOM LRG (PROTECTIVE WEAR) ×4 IMPLANT
BUR OVAL 4.0 (BURR) ×2 IMPLANT
CANISTER SUCT LVC 12 LTR MEDI- (MISCELLANEOUS) ×2 IMPLANT
CANNULA ACUFLEX KIT 5X76 (CANNULA) ×2 IMPLANT
CLOTH BEACON ORANGE TIMEOUT ST (SAFETY) ×2 IMPLANT
CONT SPEC 4OZ CLIKSEAL STRL BL (MISCELLANEOUS) ×4 IMPLANT
DRAPE INCISE 23X17 IOBAN STRL (DRAPES) ×1
DRAPE INCISE IOBAN 23X17 STRL (DRAPES) ×1 IMPLANT
DRAPE INCISE IOBAN 66X45 STRL (DRAPES) ×2 IMPLANT
DRAPE STERI 35X30 U-POUCH (DRAPES) ×2 IMPLANT
DRAPE SURG 17X11 SM STRL (DRAPES) ×2 IMPLANT
DRAPE U-SHAPE 47X51 STRL (DRAPES) ×2 IMPLANT
DRSG PAD ABDOMINAL 8X10 ST (GAUZE/BANDAGES/DRESSINGS) ×2 IMPLANT
DURAPREP 26ML APPLICATOR (WOUND CARE) ×4 IMPLANT
GLOVE BIO SURGEON STRL SZ7.5 (GLOVE) ×2 IMPLANT
GLOVE BIO SURGEON STRL SZ8 (GLOVE) ×2 IMPLANT
GLOVE EUDERMIC 7 POWDERFREE (GLOVE) ×2 IMPLANT
GLOVE SS BIOGEL STRL SZ 7.5 (GLOVE) ×1 IMPLANT
GLOVE SUPERSENSE BIOGEL SZ 7.5 (GLOVE) ×1
GOWN STRL NON-REIN LRG LVL3 (GOWN DISPOSABLE) ×2 IMPLANT
GOWN STRL REIN XL XLG (GOWN DISPOSABLE) ×4 IMPLANT
KIT BASIN OR (CUSTOM PROCEDURE TRAY) ×2 IMPLANT
KIT ROOM TURNOVER OR (KITS) ×2 IMPLANT
KIT SHOULDER TRACTION (DRAPES) ×2 IMPLANT
MANIFOLD NEPTUNE II (INSTRUMENTS) ×2 IMPLANT
NDL SUT 6 .5 CRC .975X.05 MAYO (NEEDLE) IMPLANT
NEEDLE MAYO TAPER (NEEDLE)
NEEDLE SPNL 18GX3.5 QUINCKE PK (NEEDLE) ×2 IMPLANT
NS IRRIG 1000ML POUR BTL (IV SOLUTION) ×2 IMPLANT
PACK SHOULDER (CUSTOM PROCEDURE TRAY) ×2 IMPLANT
PAD ARMBOARD 7.5X6 YLW CONV (MISCELLANEOUS) ×4 IMPLANT
SET ARTHROSCOPY TUBING (MISCELLANEOUS) ×1
SET ARTHROSCOPY TUBING LN (MISCELLANEOUS) ×1 IMPLANT
SLING ARM FOAM STRAP LRG (SOFTGOODS) ×2 IMPLANT
SLING ARM FOAM STRAP MED (SOFTGOODS) IMPLANT
SPONGE GAUZE 4X4 12PLY (GAUZE/BANDAGES/DRESSINGS) ×2 IMPLANT
SPONGE LAP 4X18 X RAY DECT (DISPOSABLE) ×2 IMPLANT
STRIP CLOSURE SKIN 1/2X4 (GAUZE/BANDAGES/DRESSINGS) ×2 IMPLANT
SUT MNCRL AB 3-0 PS2 18 (SUTURE) ×2 IMPLANT
SUT PDS AB 1 CT  36 (SUTURE)
SUT PDS AB 1 CT 36 (SUTURE) IMPLANT
SYR 20CC LL (SYRINGE) ×2 IMPLANT
TAPE PAPER 3X10 WHT MICROPORE (GAUZE/BANDAGES/DRESSINGS) ×2 IMPLANT
TOWEL OR 17X24 6PK STRL BLUE (TOWEL DISPOSABLE) ×2 IMPLANT
TOWEL OR 17X26 10 PK STRL BLUE (TOWEL DISPOSABLE) ×2 IMPLANT
WAND SUCTION MAX 4MM 90S (SURGICAL WAND) ×2 IMPLANT
WATER STERILE IRR 1000ML POUR (IV SOLUTION) ×2 IMPLANT

## 2012-09-02 NOTE — Preoperative (Signed)
Beta Blockers   Reason not to administer Beta Blockers:Not Applicable 

## 2012-09-02 NOTE — Anesthesia Procedure Notes (Signed)
Procedure Name: Intubation Date/Time: 09/02/2012 5:55 PM Performed by: Elon Alas Pre-anesthesia Checklist: Patient identified, Timeout performed, Emergency Drugs available, Suction available and Patient being monitored Patient Re-evaluated:Patient Re-evaluated prior to inductionOxygen Delivery Method: Circle system utilized Preoxygenation: Pre-oxygenation with 100% oxygen Intubation Type: IV induction and Cricoid Pressure applied Ventilation: Mask ventilation with difficulty Laryngoscope Size: Mac and 4 Grade View: Grade I Tube type: Oral Tube size: 8.0 mm Number of attempts: 1 Airway Equipment and Method: Stylet Placement Confirmation: positive ETCO2,  ETT inserted through vocal cords under direct vision and breath sounds checked- equal and bilateral Secured at: 24 cm Tube secured with: Tape Dental Injury: Teeth and Oropharynx as per pre-operative assessment

## 2012-09-02 NOTE — H&P (Signed)
  Ian [name]    Chief Complaint: ongoing fevers and drainage left shouler HPI: The patient is a 51 y.o. male we have been following as outpatient s/p I&D left shoulder for suspected reaction to implants being treated empirically on IV Vancomycin even though negative cultures. He was discharged last Monday but developed spontaneous drainage from one of his portals last Wednesday that we have been following with daily wound checks. Despite improvements in his drainage he has systemically began to feel ill over weekend and has developed fevers up to 101.7. He is being readmitted for repeat I&D of his left shoulder  Past Medical History  Diagnosis Date  . GERD (gastroesophageal reflux disease)   . Internal bleeding hemorrhoids 2012    "might bleed once/month; only when I strain"    Past Surgical History  Procedure Date  . Shoulder arthroscopy w/ rotator cuff repair 07/09/2012    left  . Appendectomy 2011  . Colonoscopy w/ biopsies and polypectomy 2012  . Shoulder arthroscopy 08/22/2012    Procedure: ARTHROSCOPY SHOULDER;  Surgeon: Senaida Lange, MD;  Location: [facility] OR;  Service: Orthopedics;  Laterality: Left;  Left shoulder arthroscopic lavage and debridement    No family history on file.  Social History:  reports that he has never smoked. He has never used smokeless tobacco. He reports that he drinks alcohol. He reports that he does not use illicit drugs.  Allergies:  Allergies  Allergen Reactions  . Asa (Aspirin) Nausea Only and Rash  . Penicillins Rash  . Pork-Derived Products Diarrhea and Nausea And Vomiting  . Tetanus Toxoids Anaphylaxis  . Beef-Derived Products Nausea And Vomiting    "don't digest beef very well"    Prior to Admission medications   Medication Sig Start Date End Date Taking? Authorizing Provider  fexofenadine-pseudoephedrine (ALLEGRA-D 24) 180-240 MG per 24 hr tablet Take 1 tablet by mouth daily.    Historical Provider, MD  HYDROcodone-acetaminophen  (NORCO/VICODIN) 5-325 MG per tablet Take 1-2 tablets by mouth every 4 (four) hours as needed. 08/25/12 09/04/12  Senaida Lange, MD  ibuprofen (ADVIL,MOTRIN) 200 MG tablet Take 600 mg by mouth 2 (two) times daily as needed.    Historical Provider, MD  methocarbamol (ROBAXIN) 500 MG tablet Take 1 tablet (500 mg total) by mouth every 6 (six) hours as needed. 08/26/12 09/05/12  Ralene Bathe, PA  omeprazole (PRILOSEC) 20 MG capsule Take 20 mg by mouth daily.    Historical Provider, MD  sodium chloride 0.9 % SOLN 500 mL with vancomycin 1000 MG SOLR 2,000 mg Inject 2,000 mg into the vein every 12 (twelve) hours. 08/26/12   Senaida Lange, MD    ROS: as per HPI  Physical Exam: General appearance: he looks somewhat ill. his left shoulder shows scant to mild serous drainage from the superior portal.His lung fields were clear to auscultation Vitals @VSRANGES @  Assessment/Plan Impression: Ongoing fevers and drainage S/P I&D Left shoulder Plan of Action: Admit for repeat I&D tonight  [facility] for Dr. Caryn Bee Supple 09/02/2012, 11:50 AM

## 2012-09-02 NOTE — Anesthesia Postprocedure Evaluation (Signed)
  Anesthesia Post-op Note  Patient: Ian [name]  Procedure(s) Performed: Procedure(s) (LRB) with comments: ARTHROSCOPY SHOULDER (Left) - Left shoulder arthroscopy irrigation and debridement  Patient Location: PACU  Anesthesia Type: General  Level of Consciousness: awake, alert , oriented and patient cooperative  Airway and Oxygen Therapy: Patient Spontanous Breathing  Post-op Pain: mild  Post-op Assessment: Post-op Vital signs reviewed, Patient's Cardiovascular Status Stable, Respiratory Function Stable, Patent Airway, No signs of Nausea or vomiting and Pain level controlled  Post-op Vital Signs: stable  Complications: No apparent anesthesia complications

## 2012-09-02 NOTE — Progress Notes (Signed)
Called IV team regarding use of PICC line. IV nurse instructed me that it was okay to connect fluids to PICC line.

## 2012-09-02 NOTE — Op Note (Signed)
09/02/2012  7:19 PM  PATIENT:   Ian [name]  51 y.o. male  PRE-OPERATIVE DIAGNOSIS:  l shoulder infe tion  POST-OPERATIVE DIAGNOSIS:  Left shoulder inflammatory reaction, infection vs foreign body reaction  PROCEDURE:  arthroscopic lavage, synovectomy, debridement  SURGEON:  Indie Nickerson, Vania Rea. M.D.  ASSISTANTS: Shuford pac   ANESTHESIA:   GET  EBL: min  SPECIMEN:  Tissue for culture and pathology  Drains: hemovac X1   PATIENT DISPOSITION:  PACU - hemodynamically stable.    PLAN OF CARE: Admit to inpatient  WBC down to 3. Will d/c vanco and begin daptomycin. ID reconsulted and will see in am. hemovac out when drainage minimal  Dictation# [id number]

## 2012-09-02 NOTE — Progress Notes (Signed)
ANTIBIOTIC CONSULT NOTE - INITIAL  Pharmacy Consult for daptomycin Indication: left shoulder infection  Allergies  Allergen Reactions  . Asa (Aspirin) Nausea Only and Rash  . Penicillins Rash  . Pork-Derived Products Diarrhea and Nausea And Vomiting  . Tetanus Toxoids Anaphylaxis  . Beef-Derived Products Nausea And Vomiting    "don't digest beef very well"    Patient Measurements: Height: 5\' 9"  (175.3 cm) Weight: 257 lb 0.9 oz (116.6 kg) IBW/kg (Calculated) : 70.7   Vital Signs: Temp: 100.1 F (37.8 C) (09/09 1945) Temp src: Oral (09/09 1230) BP: 150/76 mmHg (09/09 1230) Pulse Rate: 88  (09/09 1900) Intake/Output from previous day:   Intake/Output from this shift:    Labs:  Basename 09/02/12 1330  WBC 3.1*  HGB 12.8*  PLT 159  LABCREA --  CREATININE 0.92   Estimated Creatinine Clearance: 118.4 ml/min (by C-G formula based on Cr of 0.92). No results found for this basename: VANCOTROUGH:2,VANCOPEAK:2,VANCORANDOM:2,GENTTROUGH:2,GENTPEAK:2,GENTRANDOM:2,TOBRATROUGH:2,TOBRAPEAK:2,TOBRARND:2,AMIKACINPEAK:2,AMIKACINTROU:2,AMIKACIN:2, in the last 72 hours   Microbiology: Recent Results (from the past 720 hour(s))  BODY FLUID CULTURE     Status: Normal   Collection Time   08/21/12  6:23 PM      Component Value Range Status Comment   Specimen Description FLUID SHOULDER LEFT   Final    Special Requests BURSAE FLUID GLENOHUMERAL JOINT FLUID ON SWABS   Final    Gram Stain     Final    Value: ABUNDANT WBC PRESENT,BOTH PMN AND MONONUCLEAR     NO ORGANISMS SEEN     Gram Stain Report Called to,Read Back By and Verified With: Gram Stain Report Called to,Read Back By and Verified With: DR SUPPLE 2057 08/21/12 BY A BROWNING   Culture NO GROWTH 3 DAYS   Final    Report Status 08/25/2012 FINAL   Final   ANAEROBIC CULTURE     Status: Normal   Collection Time   08/21/12  6:25 PM      Component Value Range Status Comment   Specimen Description FLUID SHOULDER LEFT   Final    Special  Requests BURSAE FLUID GLENOHUMERAL JOINT FLUID ON SWABS   Final    Gram Stain     Final    Value: ABUNDANT WBC PRESENT,BOTH PMN AND MONONUCLEAR     NO ORGANISMS SEEN     Gram Stain Report Called to,Read Back By and Verified With: Gram Stain Report Called to,Read Back By and Verified With: DR SUPPLE 2057 08/21/12 BY A BROWNING   Culture NO ANAEROBES ISOLATED   Final    Report Status 08/26/2012 FINAL   Final   GRAM STAIN     Status: Normal   Collection Time   08/21/12  6:25 PM      Component Value Range Status Comment   Specimen Description FLUID SHOULDER LEFT   Final    Special Requests BURSAE FLUID GLENOHUMERAL JOINT FLUID ON SWABS   Final    Gram Stain     Final    Value: ABUNDANT WBC PRESENT,BOTH PMN AND MONONUCLEAR     NO ORGANISMS SEEN     Gram Stain Report Called to,Read Back By and Verified With: DR SUPPLE 2057 08/21/12 A BROWNING   Report Status 08/21/2012 FINAL   Final   BODY FLUID CULTURE     Status: Normal   Collection Time   08/21/12  6:35 PM      Component Value Range Status Comment   Specimen Description FLUID SHOULDER LEFT   Final    Special  Requests     Final    Value: GLENOHUMERAL ON SWABS LEFT GLENOHUMERAL FLUID ON SWABS BURSAE ON SWABS   Gram Stain     Final    Value: FEW WBC PRESENT,BOTH PMN AND MONONUCLEAR     NO ORGANISMS SEEN     Gram Stain Report Called to,Read Back By and Verified With: Gram Stain Report Called to,Read Back By and Verified With: DR SUPPLE 2057 08/21/12 BY A BROWNING Performed at [facility]   Culture NO GROWTH 3 DAYS   Final    Report Status 08/25/2012 FINAL   Final   ANAEROBIC CULTURE     Status: Normal   Collection Time   08/21/12  6:36 PM      Component Value Range Status Comment   Specimen Description FLUID SHOULDER LEFT   Final    Special Requests     Final    Value: GLENOHUMERAL ON SWABS LEFT GLENOHUMERAL FLUID ON SWABS BURSAE FLUID ON SWABS   Gram Stain     Final    Value: FEW WBC PRESENT,BOTH PMN AND MONONUCLEAR     NO  ORGANISMS SEEN     Gram Stain Report Called to,Read Back By and Verified With: Gram Stain Report Called to,Read Back By and Verified With: DR SUPPLE 2057 08/21/12 BY A BROWNING Performed at [facility]   Culture NO ANAEROBES ISOLATED   Final    Report Status 08/26/2012 FINAL   Final   GRAM STAIN     Status: Normal   Collection Time   08/21/12  6:36 PM      Component Value Range Status Comment   Specimen Description FLUID SHOULDER LEFT   Final    Special Requests GLENOHUMERAL SWABS BURSAE FLUID ON SWABS   Final    Gram Stain     Final    Value: FEW WBC PRESENT,BOTH PMN AND MONONUCLEAR     NO ORGANISMS SEEN     Gram Stain Report Called to,Read Back By and Verified With: DR SUPPLE 2057 08/21/12 A BROWNING   Report Status 08/21/2012 FINAL   Final   SURGICAL PCR SCREEN     Status: Abnormal   Collection Time   08/21/12  6:44 PM      Component Value Range Status Comment   MRSA, PCR NEGATIVE  NEGATIVE Final    Staphylococcus aureus POSITIVE (*) NEGATIVE Final   URINE CULTURE     Status: Normal   Collection Time   08/21/12  9:13 PM      Component Value Range Status Comment   Specimen Description URINE, CLEAN CATCH   Final    Special Requests Normal   Final    Culture  Setup Time 08/21/2012 22:00   Final    Colony Count NO GROWTH   Final    Culture NO GROWTH   Final    Report Status 08/22/2012 FINAL   Final     Medical History: Past Medical History  Diagnosis Date  . GERD (gastroesophageal reflux disease)   . Internal bleeding hemorrhoids 2012    "might bleed once/month; only when I strain"    Medications:  Prescriptions prior to admission  Medication Sig Dispense Refill  . docusate sodium (COLACE) 100 MG capsule Take 100 mg by mouth 2 (two) times daily.      . fexofenadine-pseudoephedrine (ALLEGRA-D 24) 180-240 MG per 24 hr tablet Take 1 tablet by mouth every morning.       Marland Kitchen HYDROcodone-acetaminophen (NORCO/VICODIN) 5-325 MG per tablet  Take 1-2 tablets by mouth every 4 (four)  hours as needed. For pain.      . methocarbamol (ROBAXIN) 500 MG tablet Take 500 mg by mouth every 6 (six) hours as needed. For muscle spasms/pain.      Marland Kitchen omeprazole (PRILOSEC) 20 MG capsule Take 20 mg by mouth daily.      . sodium chloride 0.9 % SOLN 250 mL with vancomycin 1000 MG SOLR 1,000 mg Inject 1,000 mg into the vein every 12 (twelve) hours.       Assessment: 51 yo male s/p I&D and removal of hardware on 8/30 for presumed septic shoulder. Plan was for 6 weeks of IV vancomycin which started 8/28; however patient is admitted today with ongoing fevers and shoulder drainage s/p repeat I&D. Pharmacy consulted to begin daptomycin.  Goal of Therapy:  resolution of infection  Plan:  -Daptomycin 6 mg/kg IV q24h -Follow-up renal function and microdata -Lab to add on CK from this morning and then weekly CKs   [facility], [place].D., BCPS Clinical Pharmacist Pager: [phone] 09/02/2012 10:03 PM

## 2012-09-02 NOTE — Transfer of Care (Signed)
Immediate Anesthesia Transfer of Care Note  Patient: Ian [name]  Procedure(s) Performed: Procedure(s) (LRB) with comments: ARTHROSCOPY SHOULDER (Left) - Left shoulder arthroscopy irrigation and debridement  Patient Location: PACU  Anesthesia Type: General  Level of Consciousness: awake  Airway & Oxygen Therapy: Patient Spontanous Breathing and Patient connected to nasal cannula oxygen  Post-op Assessment: Report given to PACU RN and Post -op Vital signs reviewed and stable  Post vital signs: Reviewed and stable  Complications: No apparent anesthesia complications

## 2012-09-02 NOTE — Anesthesia Preprocedure Evaluation (Signed)
Anesthesia Evaluation  Patient identified by MRN, date of birth, ID band Patient awake    Reviewed: Allergy & Precautions, H&P , NPO status , Patient's Chart, lab work & pertinent test results  Airway Mallampati: II  Neck ROM: full    Dental   Pulmonary          Cardiovascular     Neuro/Psych    GI/Hepatic GERD-  ,  Endo/Other  Morbid obesity  Renal/GU      Musculoskeletal   Abdominal   Peds  Hematology   Anesthesia Other Findings   Reproductive/Obstetrics                           Anesthesia Physical Anesthesia Plan  ASA: II  Anesthesia Plan: General   Post-op Pain Management:    Induction: Intravenous  Airway Management Planned: Oral ETT  Additional Equipment:   Intra-op Plan:   Post-operative Plan: Extubation in OR  Informed Consent: I have reviewed the patients History and Physical, chart, labs and discussed the procedure including the risks, benefits and alternatives for the proposed anesthesia with the patient or authorized representative who has indicated his/her understanding and acceptance.     Plan Discussed with: CRNA and Surgeon  Anesthesia Plan Comments:         Anesthesia Quick Evaluation

## 2012-09-03 ENCOUNTER — Encounter (HOSPITAL_COMMUNITY): Payer: Self-pay | Admitting: Orthopedic Surgery

## 2012-09-03 DIAGNOSIS — M009 Pyogenic arthritis, unspecified: Secondary | ICD-10-CM

## 2012-09-03 LAB — CBC WITH DIFFERENTIAL/PLATELET
Basophils Absolute: 0 10*3/uL (ref 0.0–0.1)
Eosinophils Relative: 2 % (ref 0–5)
HCT: 36.9 % — ABNORMAL LOW (ref 39.0–52.0)
Hemoglobin: 12.8 g/dL — ABNORMAL LOW (ref 13.0–17.0)
Lymphocytes Relative: 37 % (ref 12–46)
Lymphs Abs: 1.3 10*3/uL (ref 0.7–4.0)
MCV: 88.3 fL (ref 78.0–100.0)
Monocytes Absolute: 0.7 10*3/uL (ref 0.1–1.0)
Neutro Abs: 1.3 10*3/uL — ABNORMAL LOW (ref 1.7–7.7)
RBC: 4.18 MIL/uL — ABNORMAL LOW (ref 4.22–5.81)
RDW: 12.3 % (ref 11.5–15.5)
WBC: 3.5 10*3/uL — ABNORMAL LOW (ref 4.0–10.5)

## 2012-09-03 MED ORDER — ALTEPLASE 100 MG IV SOLR
2.0000 mg | Freq: Once | INTRAVENOUS | Status: AC
  Administered 2012-09-03: 2 mg
  Filled 2012-09-03: qty 2

## 2012-09-03 MED ORDER — SODIUM CHLORIDE 0.9 % IJ SOLN
10.0000 mL | INTRAMUSCULAR | Status: DC | PRN
  Administered 2012-09-03 – 2012-09-05 (×3): 10 mL

## 2012-09-03 NOTE — Op Note (Signed)
NAMEMASAO, JUNKER NO.:  [id number]  MEDICAL RECORD NO.:  [id number]  LOCATION:  5N32C                        FACILITY:  [facility]  PHYSICIAN:  Vania Rea. Lorenz Donley, M.D.  DATE OF BIRTH:  03-26-1961  DATE OF PROCEDURE:  09/02/2012 DATE OF DISCHARGE:                              OPERATIVE REPORT   PREOPERATIVE DIAGNOSIS:  Left shoulder inflammatory reaction, rule out infection versus a foreign body reaction.  POSTOPERATIVE DIAGNOSIS:  Left shoulder inflammatory reaction, rule out infection versus a foreign body reaction.  PROCEDURE:  Left shoulder arthroscopic lavage, extensive synovectomy and debridement.  SURGEON:  Vania Rea. Maleny Candy, M.D.  ASSISTANT:  Jeralene Huff, PA-C  ANESTHESIA:  General endotracheal.  ESTIMATED BLOOD LOSS:  Minimal.  DRAINS:  Hemovac x1.  SPECIMENS:  Tissue from the shoulder joint was removed and sent for routine pathology and specimen was also sent for routine pan culture.  HISTORY:  Mr. Carew is a 51 year old gentleman who underwent an arthroscopic rotator cuff repair that I performed back on July 09, 2012 and approximately 7 weeks postop presented with increasing shoulder pain, malaise, and erythema about an arthroscopy portal and was ultimately taken to surgery on August 21, 2012, where I performed arthroscopic lavage and removal of his orthopedic implant to be utilized for the rotator cuff and cultures we obtained at time of the debridement as well as aspiration of joints in the office did not grow any organisms and no organisms were seen and he has been empirically placed on vancomycin.  He presented to the office last week with some increasing pain and drainage from the shoulder which has been monitored and today returned to the office with increasing malaise as well as complaints of fevers to 101.  Some persistent drainage has been noted from one of the arthroscopy portals and he is now brought back to the operating room  for planned repeat I and D.  I preoperatively counseled Mr. Hislop on treatment options as well as risks versus benefits thereof.  Possible surgical complications were once again reviewed with potential for bleeding, infection, neurovascular injury, anesthetic complication, as well as possible need for additional surgery.  He understands and accepts and agrees with our planned procedure.  PROCEDURE IN DETAIL:  After undergoing routine preop evaluation, the patient was brought to the operating room and maintained on his current dosage of antibiotics which was vancomycin and brought to the operating room, placed supine on the operating table, underwent smooth induction of a general endotracheal anesthesia.  Turned to the right lateral decubitus position on a beanbag and appropriately padded and protected. Left shoulder examination under anesthesia revealed some restricted mobility and gentle manipulation was performed and upon this, large amount of serous fluid as well as multiple clumped pieces of proteinaceous material emanated from the portal which had been noted to drain.  This was cleaned and dried and left arm was then suspended at 70 degrees of abduction with 10 pounds of traction.  Left shoulder girdle region was sterilely prepped and draped in standard fashion.  Time-out was called.  I attempted an aspiration of the glenohumeral joint with an 18-gauge needle and just a scant amount of blood  was retrieved.  A small amount was not sent for culture.  I then introduced an arthroscopic cannula into the glenohumeral joint through the posterior portal and direct anterior portal was established under direct visualization. Significant proliferative overgrowth of the synovial lining as well as large clumps of proteinaceous exudate were noted within the glenohumeral joint and samples of this were removed and sent for the above mentioned pathologic examination as well as for culture and  sensitivity.  I then performed an extensive synovectomy and lavage and debridement of the glenohumeral joint.  The articular surfaces looked to be in good condition.  There was an obvious recurrent rotator cuff tear which we noted at the previous surgery and this has not changed significantly. We then dropped the arm down to 30 degrees abduction with the arthroscope reintroduced into the subacromial space posteriorly and direct lateral portal reestablished in the subacromial space and again through this, we performed an extensive bursectomy and lysis of adhesions, debridement of nonvital tissue.  Again we noted numerous clumps of fibrinous material and all these were evacuated.  Six liters of fluid was irrigated through the joint.  At the completion of the case, a Hemovac drain was placed and brought out posteriorly.  The portals were then closed with a single 2-0 nylon suture.  Dry dressing was then placed about the left shoulder.  Left arm was placed in a sling.  The patient was awakened, extubated, and taken to recovery room in stable condition.     Vania Rea. Nashira Mcglynn, M.D.     KMS/MEDQ  D:  09/02/2012  T:  09/03/2012  Job:  [id number]

## 2012-09-03 NOTE — Consult Note (Signed)
INFECTIOUS DISEASE CONSULT NOTE  Date of Admission:  09/02/2012  Date of Consult:  09/03/2012  Reason for Consult: Septic Arthritis, fever Referring Physician: Supple  Impression/Recommendation Septic Arthritis Fever  Would-  Watch on daptomycin Check Ck baseline, (also will check on 9-16)  Await tissue Cx's and pathology  Comment- not clear if his illness is related to vanco reaction (leukopenia, feeling poorly with infusions, chills) or if he has reaction to prosthetic from his rotator cuff repair. Would plan to keep his original stop date in October 10. Have him f/u with Dr Daiva Eves sooner than previously scheduled. Pt cautioned about possible myopathy/myositis from dapto.    Thank you so much for this interesting consult,   Ian [name] [phone]  Ian [name] is an 51 y.o. male.  HPI: 51 yo M with hx of L shoulder rotator cuff repair 07/09/2029 the [facility]. He was readmitted to the hospital on 08/21/2012 with 48 hours of malaise and erythema around with his portals. He underwent an aspiration in the office which showed cloudy fluid. On August 29, he underwent extensive synovectomy and lavage of the glenohumeral joint, bursectomy and lysis of adhesions, removal of retained suture and suture anchors, and debridement of recurrent rotator cuff tear. Path- acute inflammation and focal necrosis. Postoperatively he was started on vancomycin. His operative cultures were negative and he was discharged home on intravenous vancomycin via a PICC line on 08-26-12. It was not clear at the time of his discharge whether he had an infection or a reaction (chemical synovitis) to the materials used for his previous rotator cuff repair. He had repeat labs 9-6 showing WBC 3.9 (ANC 2.1), Cr 0.98, Vanco 15.1.  He returned to the hospital on 09-02-12 with a fever up to 101.7 as well as feeling systemically ill. He is also noticed spontaneous drainage from one of his wound portals. He underwent repeat I  and D. of his shoulder on 09/02/2012. He has been noted to have a decrease in his white blood cell count which has been attributed to vancomycin, he has been changed to daptomycin.  Past Medical History  Diagnosis Date  . GERD (gastroesophageal reflux disease)   . Internal bleeding hemorrhoids 2012    "might bleed once/month; only when I strain"    Past Surgical History  Procedure Date  . Shoulder arthroscopy w/ rotator cuff repair 07/09/2012    left  . Appendectomy 2011  . Colonoscopy w/ biopsies and polypectomy 2012  . Shoulder arthroscopy 08/22/2012    Procedure: ARTHROSCOPY SHOULDER;  Surgeon: Senaida Lange, MD;  Location: [facility] OR;  Service: Orthopedics;  Laterality: Left;  Left shoulder arthroscopic lavage and debridement  . Shoulder arthroscopy 09/02/2012    Procedure: ARTHROSCOPY SHOULDER;  Surgeon: Senaida Lange, MD;  Location: [facility] OR;  Service: Orthopedics;  Laterality: Left;  Left shoulder arthroscopy irrigation and debridement     Allergies  Allergen Reactions  . Asa (Aspirin) Nausea Only and Rash  . Penicillins Rash  . Pork-Derived Products Diarrhea and Nausea And Vomiting  . Tetanus Toxoids Anaphylaxis  . Beef-Derived Products Nausea And Vomiting    "don't digest beef very well"    Medications:  Scheduled:   . DAPTOmycin (CUBICIN)  IV  6 mg/kg Intravenous Q24H  . docusate sodium  100 mg Oral BID  . enoxaparin (LOVENOX) injection  40 mg Subcutaneous Q24H  . HYDROmorphone      . loratadine  10 mg Oral Daily  . ondansetron      .  pantoprazole  40 mg Oral Q1200  . DISCONTD: chlorhexidine  60 mL Topical Once    Total days of antibiotics 13    vancomycin    Day 13    daptomycin    Day 1          Social History:  reports that he has never smoked. He has never used smokeless tobacco. He reports that he drinks alcohol. He reports that he does not use illicit drugs.  History reviewed. No pertinent family history.  General ROS: no dysuria, normal BM, no problems with  PIC (d/c or pain), decreased appetite, see HPI.  Blood pressure 144/69, pulse 72, temperature 98.5 F (36.9 C), temperature source Oral, resp. rate 18, height 5\' 9"  (1.753 m), weight 116.6 kg (257 lb 0.9 oz), SpO2 98.00%. General appearance: alert, cooperative and no distress Eyes: negative findings: conjunctivae and sclerae normal and pupils equal, round, reactive to light and accomodation Throat: normal findings: oropharynx pink & moist without lesions or evidence of thrush Lungs: clear to auscultation bilaterally Heart: regular rate and rhythm Abdomen: normal findings: bowel sounds normal and soft, non-tender Extremities: edema none and RUE PIC is clean. nontender. His Lshoulder has mild swelling, non-tender, no d/c from wounds, mod increase in heat. Scabbed/fever blisters on R angle of mouth.   Results for orders placed during the hospital encounter of 09/02/12 (from the past 48 hour(s))  CBC     Status: Abnormal   Collection Time   09/02/12  1:30 PM      Component Value Range Comment   WBC 3.1 (*) 4.0 - 10.5 K/uL    RBC 4.25  4.22 - 5.81 MIL/uL    Hemoglobin 12.8 (*) 13.0 - 17.0 g/dL    HCT 16.1 (*) 09.6 - 52.0 %    MCV 87.5  78.0 - 100.0 fL    MCH 30.1  26.0 - 34.0 pg    MCHC 34.4  30.0 - 36.0 g/dL    RDW 04.5  40.9 - 81.1 %    Platelets 159  150 - 400 K/uL   COMPREHENSIVE METABOLIC PANEL     Status: Abnormal   Collection Time   09/02/12  1:30 PM      Component Value Range Comment   Sodium 131 (*) 135 - 145 mEq/L    Potassium 3.9  3.5 - 5.1 mEq/L    Chloride 98  96 - 112 mEq/L    CO2 25  19 - 32 mEq/L    Glucose, Bld 89  70 - 99 mg/dL    BUN 9  6 - 23 mg/dL    Creatinine, Ser 9.14  0.50 - 1.35 mg/dL    Calcium 8.8  8.4 - 78.2 mg/dL    Total Protein 5.9 (*) 6.0 - 8.3 g/dL    Albumin 2.7 (*) 3.5 - 5.2 g/dL    AST 23  0 - 37 U/L    ALT 28  0 - 53 U/L    Alkaline Phosphatase 57  39 - 117 U/L    Total Bilirubin 0.4  0.3 - 1.2 mg/dL    GFR calc non Af Amer >90  >90 mL/min     GFR calc Af Amer >90  >90 mL/min   DIFFERENTIAL     Status: Abnormal   Collection Time   09/02/12  1:30 PM      Component Value Range Comment   Neutrophils Relative 50  43 - 77 %    Neutro Abs 1.5 (*) 1.7 -  7.7 K/uL    Lymphocytes Relative 34  12 - 46 %    Lymphs Abs 1.0  0.7 - 4.0 K/uL    Monocytes Relative 15 (*) 3 - 12 %    Monocytes Absolute 0.5  0.1 - 1.0 K/uL    Eosinophils Relative 2  0 - 5 %    Eosinophils Absolute 0.1  0.0 - 0.7 K/uL    Basophils Relative 0  0 - 1 %    Basophils Absolute 0.0  0.0 - 0.1 K/uL   C-REACTIVE PROTEIN     Status: Abnormal   Collection Time   09/02/12  1:30 PM      Component Value Range Comment   CRP 2.3 (*) <0.60 mg/dL   SEDIMENTATION RATE     Status: Abnormal   Collection Time   09/02/12  1:30 PM      Component Value Range Comment   Sed Rate 32 (*) 0 - 16 mm/hr   CK     Status: Normal   Collection Time   09/02/12  1:30 PM      Component Value Range Comment   Total CK 74  7 - 232 U/L   TISSUE CULTURE     Status: Normal (Preliminary result)   Collection Time   09/02/12  6:37 PM      Component Value Range Comment   Specimen Description TISSUE ARM LEFT      Special Requests PATIENT ON FOLLOWING VANCOMYCIN      Gram Stain        Value: RARE WBC PRESENT,BOTH PMN AND MONONUCLEAR     NO ORGANISMS SEEN   Culture NO GROWTH      Report Status PENDING     ANAEROBIC CULTURE     Status: Normal (Preliminary result)   Collection Time   09/02/12  6:37 PM      Component Value Range Comment   Specimen Description TISSUE ARM LEFT      Special Requests PATIENT ON FOLLOWING VANCOMYCIN      Gram Stain        Value: RARE WBC PRESENT,BOTH PMN AND MONONUCLEAR     NO ORGANISMS SEEN   Culture        Value: NO ANAEROBES ISOLATED; CULTURE IN PROGRESS FOR 5 DAYS   Report Status PENDING     CBC WITH DIFFERENTIAL     Status: Abnormal   Collection Time   09/03/12  5:18 AM      Component Value Range Comment   WBC 3.5 (*) 4.0 - 10.5 K/uL    RBC 4.18 (*) 4.22 - 5.81  MIL/uL    Hemoglobin 12.8 (*) 13.0 - 17.0 g/dL    HCT 29.5 (*) 62.1 - 52.0 %    MCV 88.3  78.0 - 100.0 fL    MCH 30.6  26.0 - 34.0 pg    MCHC 34.7  30.0 - 36.0 g/dL    RDW 30.8  65.7 - 84.6 %    Platelets 149 (*) 150 - 400 K/uL    Neutrophils Relative 38 (*) 43 - 77 %    Neutro Abs 1.3 (*) 1.7 - 7.7 K/uL    Lymphocytes Relative 37  12 - 46 %    Lymphs Abs 1.3  0.7 - 4.0 K/uL    Monocytes Relative 21 (*) 3 - 12 %    Monocytes Absolute 0.7  0.1 - 1.0 K/uL    Eosinophils Relative 2  0 - 5 %    Eosinophils  Absolute 0.1  0.0 - 0.7 K/uL    Basophils Relative 1  0 - 1 %    Basophils Absolute 0.0  0.0 - 0.1 K/uL       Component Value Date/Time   SDES TISSUE ARM LEFT 09/02/2012 1837   SDES TISSUE ARM LEFT 09/02/2012 1837   SPECREQUEST PATIENT ON FOLLOWING VANCOMYCIN 09/02/2012 1837   SPECREQUEST PATIENT ON FOLLOWING VANCOMYCIN 09/02/2012 1837   CULT NO GROWTH 09/02/2012 1837   CULT NO ANAEROBES ISOLATED; CULTURE IN PROGRESS FOR 5 DAYS 09/02/2012 1837   REPTSTATUS PENDING 09/02/2012 1837   REPTSTATUS PENDING 09/02/2012 1837   X-ray Chest Pa And Lateral   09/02/2012  *RADIOLOGY REPORT*  Clinical Data: Preop for shoulder surgery  CHEST - 2 VIEW  Comparison: Chest x-ray of 08/21/2012  Findings: No active infiltrate or effusion is seen.  Mediastinal contours are stable.  Right PICC line is present with the tip in the upper SVC.  Heart size is stable.  No bony abnormality is seen.  IMPRESSION: No active lung disease.  Right PICC tip in upper SVC.   Original Report Authenticated By: Juline Patch, M.D.    Recent Results (from the past 240 hour(s))  TISSUE CULTURE     Status: Normal (Preliminary result)   Collection Time   09/02/12  6:37 PM      Component Value Range Status Comment   Specimen Description TISSUE ARM LEFT   Final    Special Requests PATIENT ON FOLLOWING VANCOMYCIN   Final    Gram Stain     Final    Value: RARE WBC PRESENT,BOTH PMN AND MONONUCLEAR     NO ORGANISMS SEEN   Culture NO GROWTH    Final    Report Status PENDING   Incomplete   ANAEROBIC CULTURE     Status: Normal (Preliminary result)   Collection Time   09/02/12  6:37 PM      Component Value Range Status Comment   Specimen Description TISSUE ARM LEFT   Final    Special Requests PATIENT ON FOLLOWING VANCOMYCIN   Final    Gram Stain     Final    Value: RARE WBC PRESENT,BOTH PMN AND MONONUCLEAR     NO ORGANISMS SEEN   Culture     Final    Value: NO ANAEROBES ISOLATED; CULTURE IN PROGRESS FOR 5 DAYS   Report Status PENDING   Incomplete       09/03/2012, 2:45 PM     LOS: 1 day

## 2012-09-03 NOTE — Progress Notes (Signed)
CARE MANAGEMENT NOTE 09/03/2012  Patient:  Ian [name], Ian [name]   Account Number:  [id number]  Date Initiated:  09/03/2012  Documentation initiated by:  Vance Peper  Subjective/Objective Assessment:   51 yr old male s/p left shoulder arthroscopic lavage, synovectomy debridement.     Action/Plan:   CM spoke with patient and wife. Patient has PICC line, has been active with [facility], if HH needed will resume with them.CM will follow.   Anticipated DC Date:  09/05/2012   Anticipated DC Plan:           Choice offered to / List presented to:             Status of service:  In process, will continue to follow Medicare Important Message given?   (If response is "NO", the following Medicare IM given date fields will be blank) Date Medicare IM given:   Date Additional Medicare IM given:    Discharge Disposition:    Per UR Regulation:    If discussed at Long Length of Stay Meetings, dates discussed:    Comments:

## 2012-09-03 NOTE — Plan of Care (Signed)
Problem: Diagnosis - Type of Surgery Goal: General Surgical Patient Education (See Patient Education module for education specifics)  Outcome: Completed/Met Date Met:  09/03/12 Left shoulder I&D

## 2012-09-03 NOTE — Progress Notes (Signed)
Utilization review completed. Chief Walkup, RN, BSN. 

## 2012-09-03 NOTE — Progress Notes (Signed)
Ian [name]  MRN: [id number] DOB/Age: 1961-12-23 51 y.o. Physician: Jacquelyne Balint Procedure: Procedure(s) (LRB): ARTHROSCOPY SHOULDER (Left)     Subjective: States he feels 100% better than yesterday. Fevers down, shoulder has had minimal drainage from the hemovac  Vital Signs Temp:  [97.8 F (36.6 C)-100.3 F (37.9 C)] 98.5 F (36.9 C) (09/10 1300) Pulse Rate:  [72-89] 72  (09/10 1300) Resp:  [17-18] 18  (09/10 1300) BP: (124-144)/(69-92) 144/69 mmHg (09/10 1300) SpO2:  [96 %-100 %] 98 % (09/10 1300) FiO2 (%):  [28 %] 28 % (09/09 2134) Weight:  [116.6 kg (257 lb 0.9 oz)] 116.6 kg (257 lb 0.9 oz) (09/09 2100)  Lab Results  Basename 09/03/12 0518 09/02/12 1330  WBC 3.5* 3.1*  HGB 12.8* 12.8*  HCT 36.9* 37.2*  PLT 149* 159   BMET  Basename 09/02/12 1330  NA 131*  K 3.9  CL 98  CO2 25  GLUCOSE 89  BUN 9  CREATININE 0.92  CALCIUM 8.8   No results found for this basename: inr     Exam Post op dressing in place NVI  Drain removed, only bloody drainage encountered, appoximately 10cc or less in drain itself        Plan IMP:  Post op I&D L shoulder from questionable inflammatory response vs. Occult infection  Myelosuppresion ? Response to Vancomycin  Continue Daptomycin, unless Dr. Ninetta Lights feels otherwise. Will recheck CBC in am Would like to see several days of him feeling systemically "better" and WBC normalizing before consider D/C. Earliest perhaps Thursday  [facility] for Dr.Kevin Supple 09/03/2012, 3:11 PM

## 2012-09-04 LAB — CBC WITH DIFFERENTIAL/PLATELET
Basophils Absolute: 0 10*3/uL (ref 0.0–0.1)
Basophils Relative: 1 % (ref 0–1)
Eosinophils Absolute: 0.2 10*3/uL (ref 0.0–0.7)
Hemoglobin: 12.7 g/dL — ABNORMAL LOW (ref 13.0–17.0)
MCH: 30.4 pg (ref 26.0–34.0)
MCV: 88.5 fL (ref 78.0–100.0)
Monocytes Absolute: 0.5 10*3/uL (ref 0.1–1.0)
Neutro Abs: 1.2 10*3/uL — ABNORMAL LOW (ref 1.7–7.7)
RBC: 4.18 MIL/uL — ABNORMAL LOW (ref 4.22–5.81)
RDW: 12.3 % (ref 11.5–15.5)
WBC: 3.3 10*3/uL — ABNORMAL LOW (ref 4.0–10.5)

## 2012-09-04 MED ORDER — SODIUM CHLORIDE 0.9 % IV SOLN
600.0000 mg | Freq: Two times a day (BID) | INTRAVENOUS | Status: DC
  Administered 2012-09-04 – 2012-09-05 (×3): 600 mg via INTRAVENOUS
  Filled 2012-09-04 (×6): qty 600

## 2012-09-04 NOTE — Progress Notes (Signed)
Patient was told a side effect of the cubicin was muscle aches / cramps and patient states he is having them all over.  I told him to mention to Dr. Rennis Chris and the infectious disease doctors.  Also asked if he wanted a muscle relaxer and patient stated he did not need one, but he wanted to let it be in his chart he was having the muscle aches that the pharmacist had mentioned might happen with the drug.  Will continue to monitor patient closely right now no further complaints

## 2012-09-04 NOTE — Progress Notes (Signed)
Ian [name]  MRN: [id number] DOB/Age: 1961-02-24 51 y.o. Physician: Lynnea Maizes, M.D. 2 Days Post-Op Procedure(s) (LRB): ARTHROSCOPY SHOULDER (Left)  Subjective: C/o myalgias but in general feeling much better. Vital Signs Temp:  [98.1 F (36.7 C)-98.5 F (36.9 C)] 98.1 F (36.7 C) (09/11 0604) Pulse Rate:  [60-72] 62  (09/11 0604) Resp:  [18] 18  (09/11 0604) BP: (144-153)/(66-69) 153/68 mmHg (09/11 0604) SpO2:  [97 %-98 %] 97 % (09/11 0604)  Lab Results  Basename 09/04/12 0552 09/03/12 0518  WBC 3.3* 3.5*  HGB 12.7* 12.8*  HCT 37.0* 36.9*  PLT 145* 149*   BMET  Basename 09/02/12 1330  NA 131*  K 3.9  CL 98  CO2 25  GLUCOSE 89  BUN 9  CREATININE 0.92  CALCIUM 8.8   No results found for this basename: inr     Exam  Moderate bloody drainage from posterior portal, all others clean and dry. Path pending CX NGSF  Impression: S/p I and D left shoulder, ? Infection vs implant reaction  Plan Continue IV abx. Will contact ID re possibility of drug related Myalgias. Begin pendulum exercises and gentle ROM. D/C planning next 24 to 28 hrs Terre Hanneman M 09/04/2012, 12:26 PM

## 2012-09-04 NOTE — Progress Notes (Signed)
INFECTIOUS DISEASE PROGRESS NOTE  ID: Ian [name] is a 51 y.o. male with  Active Problems:  * No active hospital problems. *   Subjective: C/o myalgias  Abtx:  Anti-infectives     Start     Dose/Rate Route Frequency Ordered Stop   09/02/12 2300   DAPTOmycin (CUBICIN) 699.5 mg in sodium chloride 0.9 % IVPB        6 mg/kg  116.6 kg 228 mL/hr over 30 Minutes Intravenous Every 24 hours 09/02/12 2211            Medications:  Scheduled:   . alteplase  2 mg Intracatheter Once  . DAPTOmycin (CUBICIN)  IV  6 mg/kg Intravenous Q24H  . docusate sodium  100 mg Oral BID  . enoxaparin (LOVENOX) injection  40 mg Subcutaneous Q24H  . loratadine  10 mg Oral Daily  . pantoprazole  40 mg Oral Q1200    Objective: Vital signs in last 24 hours: Temp:  [98 F (36.7 C)-98.5 F (36.9 C)] 98 F (36.7 C) (09/11 1300) Pulse Rate:  [60-77] 77  (09/11 1300) Resp:  [18] 18  (09/11 1300) BP: (144-156)/(66-85) 156/85 mmHg (09/11 1300) SpO2:  [97 %-100 %] 100 % (09/11 1300)   General appearance: alert, cooperative and no distress Extremities: L shoulder dressed,   Lab Results  Basename 09/04/12 0552 09/03/12 0518 09/02/12 1330  WBC 3.3* 3.5* --  HGB 12.7* 12.8* --  HCT 37.0* 36.9* --  NA -- -- 131*  K -- -- 3.9  CL -- -- 98  CO2 -- -- 25  BUN -- -- 9  CREATININE -- -- 0.92  GLU -- -- --   Liver Panel  Basename 09/02/12 1330  PROT 5.9*  ALBUMIN 2.7*  AST 23  ALT 28  ALKPHOS 57  BILITOT 0.4  BILIDIR --  IBILI --   Sedimentation Rate  Basename 09/02/12 1330  ESRSEDRATE 32*   C-Reactive Protein  Basename 09/02/12 1330  CRP 2.3*    Microbiology: Recent Results (from the past 240 hour(s))  TISSUE CULTURE     Status: Normal (Preliminary result)   Collection Time   09/02/12  6:37 PM      Component Value Range Status Comment   Specimen Description TISSUE ARM LEFT   Final    Special Requests PATIENT ON FOLLOWING VANCOMYCIN   Final    Gram Stain     Final    Value: RARE WBC PRESENT,BOTH PMN AND MONONUCLEAR     NO ORGANISMS SEEN   Culture NO GROWTH 1 DAY   Final    Report Status PENDING   Incomplete   ANAEROBIC CULTURE     Status: Normal (Preliminary result)   Collection Time   09/02/12  6:37 PM      Component Value Range Status Comment   Specimen Description TISSUE ARM LEFT   Final    Special Requests PATIENT ON FOLLOWING VANCOMYCIN   Final    Gram Stain     Final    Value: RARE WBC PRESENT,BOTH PMN AND MONONUCLEAR     NO ORGANISMS SEEN   Culture     Final    Value: NO ANAEROBES ISOLATED; CULTURE IN PROGRESS FOR 5 DAYS   Report Status PENDING   Incomplete     Studies/Results: No results found.   Assessment/Plan: Arthritis Fever Myalgias Day 15 anbx (stop date 10-10) Will stop dapto (his CK shot up and his myalgias are consistent with dapto ADR) Recheck his CK Start  pt on ceftaroline, newer generation cephalosporin with MRSA coverage. He has a hx of PEN allergy, this carries a ~5% risk of cross reaction with cephalosporins. This is explained to pt and he would like to proceed. Could also consider Tygacil, however this can cause n/v.   [name] Infectious Diseases [phone] 09/04/2012, 5:13 PM   LOS: 2 days

## 2012-09-04 NOTE — Progress Notes (Signed)
Cubicin was delivered to unit @12 :20am.  Administered medication when it arrived.  Pt tolerated well.

## 2012-09-05 DIAGNOSIS — Z9889 Other specified postprocedural states: Secondary | ICD-10-CM

## 2012-09-05 MED ORDER — SODIUM CHLORIDE 0.9 % IV SOLN: 600.0000 mg | Freq: Two times a day (BID) | INTRAVENOUS | Status: DC

## 2012-09-05 NOTE — Discharge Summary (Signed)
  PATIENT ID:      Ian [name]  MRN:     [id number] DOB/AGE:    1961/09/30 / 51 y.o.     DISCHARGE SUMMARY  ADMISSION DATE:    09/02/2012 DISCHARGE DATE:   09/05/2012   ADMISSION DIAGNOSIS: l shoulder infe tion  (l shoulder infe tion)  DISCHARGE DIAGNOSIS:  l shoulder infe tion    ADDITIONAL DIAGNOSIS: Active Problems:  * No active hospital problems. *   Past Medical History  Diagnosis Date  . GERD (gastroesophageal reflux disease)   . Internal bleeding hemorrhoids 2012    "might bleed once/month; only when I strain"    PROCEDURE: Procedure(s): ARTHROSCOPY SHOULDER on 09/02/2012  CONSULTS:     HISTORY:  See H&P in chart  HOSPITAL COURSE:  Ian [name] is a 51 y.o. admitted on 09/02/2012 and found to have a diagnosis of l shoulder infe tion.  After appropriate laboratory studies were obtained  they were taken to the operating room on 09/02/2012 and underwent Procedure(s): ARTHROSCOPY SHOULDER.   They were given perioperative antibiotics:  Anti-infectives     Start     Dose/Rate Route Frequency Ordered Stop   09/05/12 0000   sodium chloride 0.9 % SOLN 250 mL with ceftaroline 600 MG SOLR 600 mg     Comments: Antibiotic stop date 10/10      600 mg 250 mL/hr over 60 Minutes Intravenous Every 12 hours 09/05/12 1309     09/04/12 1830   ceftaroline (TEFLARO) 600 mg in sodium chloride 0.9 % 250 mL IVPB     Comments: Please observe pt for 1 hour at start of infusion for drug reaction      600 mg 250 mL/hr over 60 Minutes Intravenous Every 12 hours 09/04/12 1745     09/02/12 2300   DAPTOmycin (CUBICIN) 699.5 mg in sodium chloride 0.9 % IVPB  Status:  Discontinued        6 mg/kg  116.6 kg 228 mL/hr over 30 Minutes Intravenous Every 24 hours 09/02/12 2211 09/04/12 1745        . Blood products given:none  Pt was readmitted with increasing drainage from his shoulder and systemically felt ill, decision for repeat I&D was done. ID continued to follow patient adjusting antibiotics to  patients tolerance. All cultures continued to remain negative. Pt felt better and remained afebrile with clinical improvements in his shoulder. He was felt stable to DC back to home with [facility] services to continue IV antibiotics. The remainder of the hospital course was dedicated to ambulation and strengthening.   The patient was discharged on 3 Days Post-Op in  Stable condition.

## 2012-09-05 NOTE — Progress Notes (Signed)
INFECTIOUS DISEASE PROGRESS NOTE  ID: ELDEN BRUCATO is a 51 y.o. male with   Active Problems:  * No active hospital problems. *   Subjective: No further myalgias, no rash  Abtx:  Anti-infectives     Start     Dose/Rate Route Frequency Ordered Stop   09/05/12 0000   sodium chloride 0.9 % SOLN 250 mL with ceftaroline 600 MG SOLR 600 mg     Comments: Antibiotic stop date 10/10      600 mg 250 mL/hr over 60 Minutes Intravenous Every 12 hours 09/05/12 1309     09/04/12 1830   ceftaroline (TEFLARO) 600 mg in sodium chloride 0.9 % 250 mL IVPB     Comments: Please observe pt for 1 hour at start of infusion for drug reaction      600 mg 250 mL/hr over 60 Minutes Intravenous Every 12 hours 09/04/12 1745     09/02/12 2300   DAPTOmycin (CUBICIN) 699.5 mg in sodium chloride 0.9 % IVPB  Status:  Discontinued        6 mg/kg  116.6 kg 228 mL/hr over 30 Minutes Intravenous Every 24 hours 09/02/12 2211 09/04/12 1745          Medications:  Scheduled:   . ceFTAROline (TEFLARO) IV  600 mg Intravenous Q12H  . docusate sodium  100 mg Oral BID  . enoxaparin (LOVENOX) injection  40 mg Subcutaneous Q24H  . loratadine  10 mg Oral Daily  . pantoprazole  40 mg Oral Q1200  . DISCONTD: DAPTOmycin (CUBICIN)  IV  6 mg/kg Intravenous Q24H    Objective: Vital signs in last 24 hours: Temp:  [97.5 F (36.4 C)-97.6 F (36.4 C)] 97.5 F (36.4 C) (09/12 0600) Pulse Rate:  [60-66] 60  (09/12 0600) Resp:  [16-18] 16  (09/12 0600) BP: (137-138)/(70-80) 137/70 mmHg (09/12 0600) SpO2:  [97 %] 97 % (09/12 0600)   General appearance: alert, cooperative and no distress Extremities: RUE PIC is clean  Lab Results  Basename 09/04/12 0552 09/03/12 0518  WBC 3.3* 3.5*  HGB 12.7* 12.8*  HCT 37.0* 36.9*  NA -- --  K -- --  CL -- --  CO2 -- --  BUN -- --  CREATININE -- --  GLU -- --   Liver Panel No results found for this basename:  PROT:2,ALBUMIN:2,AST:2,ALT:2,ALKPHOS:2,BILITOT:2,BILIDIR:2,IBILI:2 in the last 72 hours Sedimentation Rate No results found for this basename: ESRSEDRATE in the last 72 hours C-Reactive Protein No results found for this basename: CRP:2 in the last 72 hours  Microbiology: Recent Results (from the past 240 hour(s))  TISSUE CULTURE     Status: Normal (Preliminary result)   Collection Time   09/02/12  6:37 PM      Component Value Range Status Comment   Specimen Description TISSUE ARM LEFT   Final    Special Requests PATIENT ON FOLLOWING VANCOMYCIN   Final    Gram Stain     Final    Value: RARE WBC PRESENT,BOTH PMN AND MONONUCLEAR     NO ORGANISMS SEEN   Culture NO GROWTH 2 DAYS   Final    Report Status PENDING   Incomplete   ANAEROBIC CULTURE     Status: Normal (Preliminary result)   Collection Time   09/02/12  6:37 PM      Component Value Range Status Comment   Specimen Description TISSUE ARM LEFT   Final    Special Requests PATIENT ON FOLLOWING VANCOMYCIN   Final  Gram Stain     Final    Value: RARE WBC PRESENT,BOTH PMN AND MONONUCLEAR     NO ORGANISMS SEEN   Culture     Final    Value: NO ANAEROBES ISOLATED; CULTURE IN PROGRESS FOR 5 DAYS   Report Status PENDING   Incomplete     Studies/Results: No results found.   Assessment/Plan: Arthritis  Fever  Myalgias  Day 16 anbx (stop date 10-10)    Now on ceftaroline He appears to be tolerating anbx well Will have him see ID in clinic in 2-3 weeks Have asked him to call if he has any problems in the intervening period (he is given my card).   [name] Infectious Diseases [phone] 09/05/2012, 2:26 PM   LOS: 3 days

## 2012-09-06 LAB — TISSUE CULTURE: Culture: NO GROWTH

## 2012-09-07 LAB — ANAEROBIC CULTURE

## 2012-09-20 ENCOUNTER — Encounter (HOSPITAL_BASED_OUTPATIENT_CLINIC_OR_DEPARTMENT_OTHER): Payer: Self-pay | Admitting: *Deleted

## 2012-09-20 ENCOUNTER — Emergency Department (HOSPITAL_BASED_OUTPATIENT_CLINIC_OR_DEPARTMENT_OTHER)
Admission: EM | Admit: 2012-09-20 | Discharge: 2012-09-20 | Disposition: A | Discharge status: Home / Self Care | Payer: BC Managed Care – PPO | Attending: Emergency Medicine | Admitting: Emergency Medicine

## 2012-09-20 DIAGNOSIS — K219 Gastro-esophageal reflux disease without esophagitis: Secondary | ICD-10-CM | POA: Insufficient documentation

## 2012-09-20 DIAGNOSIS — Z452 Encounter for adjustment and management of vascular access device: Secondary | ICD-10-CM

## 2012-09-20 DIAGNOSIS — T82598A Other mechanical complication of other cardiac and vascular devices and implants, initial encounter: Secondary | ICD-10-CM | POA: Insufficient documentation

## 2012-09-20 DIAGNOSIS — Y849 Medical procedure, unspecified as the cause of abnormal reaction of the patient, or of later complication, without mention of misadventure at the time of the procedure: Secondary | ICD-10-CM | POA: Insufficient documentation

## 2012-09-20 MED ORDER — LEVOFLOXACIN 500 MG PO TABS
500.0000 mg | ORAL_TABLET | Freq: Every day | ORAL | Status: DC
  Administered 2012-09-20: 750 mg via ORAL
  Filled 2012-09-20: qty 1

## 2012-09-20 MED ORDER — DOXYCYCLINE HYCLATE 100 MG PO CAPS: 100.0000 mg | ORAL_CAPSULE | Freq: Two times a day (BID) | ORAL | Status: DC

## 2012-09-20 MED ORDER — DOXYCYCLINE HYCLATE 100 MG PO TABS
100.0000 mg | ORAL_TABLET | Freq: Once | ORAL | Status: AC
  Administered 2012-09-20: 100 mg via ORAL
  Filled 2012-09-20: qty 1

## 2012-09-20 MED ORDER — LEVOFLOXACIN 500 MG PO TABS: 500.0000 mg | ORAL_TABLET | Freq: Every day | ORAL | Status: DC

## 2012-09-20 MED ORDER — LEVOFLOXACIN 750 MG PO TABS
ORAL_TABLET | ORAL | Status: AC
  Filled 2012-09-20: qty 1

## 2012-09-20 NOTE — ED Notes (Signed)
PIC line to his right upper arm came out. He has a PIC line for antibiotics for infection after left shoulder surgery.

## 2012-09-20 NOTE — ED Notes (Signed)
PICC line from right upper arm removed by Dr. Bernette Mayers.  Site cleansed, antibiotic ointment and bandaid applied.

## 2012-09-20 NOTE — ED Provider Notes (Signed)
History     CSN: [id number]  Arrival date & time 09/20/12  1748   First MD Initiated Contact with Patient 09/20/12 1751      No chief complaint on file.   (Consider location/radiation/quality/duration/timing/severity/associated sxs/prior treatment) HPI Pt with history of L shoulder joint infection is s/p operative washout x 2, has been on Abx for about 4 weeks. Initially Vanc but switched to Daptomycin then Teflaro for adverse effects. He has been getting the Teflaro at home via PICC since discharge about 2 weeks ago. Has been feeling well, no pain in shoulder and no fevers. He reports today his PICC line was accidentally pulled out when he was taking off his shirt. He presents for evaluation and possible re-insertion of PICC line.   Past Medical History  Diagnosis Date  . GERD (gastroesophageal reflux disease)   . Internal bleeding hemorrhoids 2012    "might bleed once/month; only when I strain"    Past Surgical History  Procedure Date  . Shoulder arthroscopy w/ rotator cuff repair 07/09/2012    left  . Appendectomy 2011  . Colonoscopy w/ biopsies and polypectomy 2012  . Shoulder arthroscopy 08/22/2012    Procedure: ARTHROSCOPY SHOULDER;  Surgeon: Senaida Lange, MD;  Location: [facility] OR;  Service: Orthopedics;  Laterality: Left;  Left shoulder arthroscopic lavage and debridement  . Shoulder arthroscopy 09/02/2012    Procedure: ARTHROSCOPY SHOULDER;  Surgeon: Senaida Lange, MD;  Location: [facility] OR;  Service: Orthopedics;  Laterality: Left;  Left shoulder arthroscopy irrigation and debridement    No family history on file.  History  Substance Use Topics  . Smoking status: Never Smoker   . Smokeless tobacco: Never Used  . Alcohol Use: Yes     08/21/2012 "might drink a beer once a year"      Review of Systems All other systems reviewed and are negative except as noted in HPI.   Allergies  Asa; Penicillins; Pork-derived products; Tetanus toxoids; Beef-derived products; and  Daptomycin  Home Medications   Current Outpatient Rx  Name Route Sig Dispense Refill  . DOCUSATE SODIUM 100 MG PO CAPS Oral Take 100 mg by mouth 2 (two) times daily.    Marland Kitchen FEXOFENADINE-PSEUDOEPHED ER 180-240 MG PO TB24 Oral Take 1 tablet by mouth every morning.     Marland Kitchen HYDROCODONE-ACETAMINOPHEN 5-325 MG PO TABS Oral Take 1-2 tablets by mouth every 4 (four) hours as needed. For pain.    Marland Kitchen METHOCARBAMOL 500 MG PO TABS Oral Take 500 mg by mouth every 6 (six) hours as needed. For muscle spasms/pain.    Marland Kitchen OMEPRAZOLE 20 MG PO CPDR Oral Take 20 mg by mouth daily.    . CEFTAROLINE 600 MG IVPB Intravenous Inject 600 mg into the vein every 12 (twelve) hours. 600 mg 5 weeks    Antibiotic stop date 10/10  . VANCOMYCIN 1000 MG IVPB Intravenous Inject 1,000 mg into the vein every 12 (twelve) hours.      BP 190/98  Pulse 90  Temp 98.1 F (36.7 C) (Oral)  Resp 20  SpO2 97%  Physical Exam  Nursing note and vitals reviewed. Constitutional: He is oriented to person, place, and time. He appears well-developed and well-nourished.  HENT:  Head: Normocephalic and atraumatic.  Eyes: EOM are normal. Pupils are equal, round, and reactive to light.  Neck: Normal range of motion. Neck supple.  Cardiovascular: Normal rate, normal heart sounds and intact distal pulses.   Pulmonary/Chest: Effort normal and breath sounds normal.  Abdominal: Bowel sounds  are normal. He exhibits no distension. There is no tenderness.  Musculoskeletal: Normal range of motion. He exhibits no edema and no tenderness.       PICC line is out of RUE, no signs of bleeding or infection. No swelling or tenderness  Neurological: He is alert and oriented to person, place, and time. He has normal strength. No cranial nerve deficit or sensory deficit.  Skin: Skin is warm and dry. No rash noted.  Psychiatric: He has a normal mood and affect.    ED Course  Procedures (including critical care time)  Labs Reviewed - No data to display No  results found.   No diagnosis found.    MDM  I discussed with Dr. Daiva Eves on call for ID who is familiar with this patient. He recommends transitioning to oral Abx at this time. Recommends Doxycycline 100mg  BID and Levaquin 500mg  daily. Pt given a dose of same here, Rx and recommended to follow up with ID next week.         Tanae Petrosky B. Bernette Mayers, MD 09/20/12 [phone]

## 2012-09-23 ENCOUNTER — Telehealth: Payer: Self-pay | Admitting: *Deleted

## 2012-09-23 NOTE — Telephone Encounter (Signed)
Patient called and advised he was to see Dr Ninetta Lights on Friday, 09-27-12 but the appt was changed to Tuesday 10-01-12. He was given just enough oral antibiotics to last until that Friday visit and wants to know if we need to call him in any additional medication to get him to the Tuesday visit. Patient advised that the ED doctor called and spoke with Dr Daiva Eves last week 09-20-12 when his PICC came out therefore I am going to forward this questions to him. Patient advised he uses the [facility] on [address] in [place].

## 2012-09-25 ENCOUNTER — Other Ambulatory Visit: Payer: Self-pay | Admitting: Licensed Clinical Social Worker

## 2012-09-25 DIAGNOSIS — M009 Pyogenic arthritis, unspecified: Secondary | ICD-10-CM

## 2012-09-25 MED ORDER — DOXYCYCLINE HYCLATE 100 MG PO CAPS: 100.0000 mg | ORAL_CAPSULE | Freq: Two times a day (BID) | ORAL | Status: DC

## 2012-09-25 MED ORDER — LEVOFLOXACIN 500 MG PO TABS: 500.0000 mg | ORAL_TABLET | Freq: Every day | ORAL | Status: DC

## 2012-09-25 NOTE — Telephone Encounter (Signed)
I think he should be able to stop the abx on 10-10 but Dr. Ninetta Lights may have other thoughts since he saw the patient more recently

## 2012-09-27 ENCOUNTER — Inpatient Hospital Stay: Payer: BC Managed Care – PPO | Admitting: Infectious Diseases

## 2012-10-01 ENCOUNTER — Inpatient Hospital Stay: Payer: BC Managed Care – PPO | Admitting: Infectious Diseases

## 2012-10-01 ENCOUNTER — Inpatient Hospital Stay: Payer: BC Managed Care – PPO | Admitting: Infectious Disease

## 2012-10-02 ENCOUNTER — Encounter: Payer: Self-pay | Admitting: Infectious Diseases

## 2012-10-02 ENCOUNTER — Ambulatory Visit (INDEPENDENT_AMBULATORY_CARE_PROVIDER_SITE_OTHER): Payer: BC Managed Care – PPO | Admitting: Infectious Diseases

## 2012-10-02 VITALS — BP 131/87 | HR 66 | Temp 98.3°F | Wt 264.0 lb

## 2012-10-02 DIAGNOSIS — M009 Pyogenic arthritis, unspecified: Secondary | ICD-10-CM

## 2012-10-02 DIAGNOSIS — Z23 Encounter for immunization: Secondary | ICD-10-CM

## 2012-10-02 NOTE — Assessment & Plan Note (Signed)
He appears to be doing very well. He has completed his anbx and his shoulder is well healed. He is off anbx. We discussed checking his ESR but was only mildly elevated prior and not sure it would change his therapy at this point. Lastly, there was some concern that this was a reaction to some of the material used in his prev shoulder surgery. I have asked him to let me know if he has any problems. He will rtc prn. He is given a flu shot today.

## 2012-10-02 NOTE — Progress Notes (Signed)
  Subjective:    Patient ID: Ian [name], male    DOB: [birth year], 51 y.o.   MRN: [id number]  HPI 51 yo M with hx of L shoulder rotator cuff repair 07/09/2029 [facility]. He was readmitted to the hospital on 08/21/2012 with 48 hours of malaise and erythema around with his portals. He underwent an aspiration in the office which showed cloudy fluid. On August 29, he underwent extensive synovectomy and lavage of the glenohumeral joint, bursectomy and lysis of adhesions, removal of retained suture and suture anchors, and debridement of recurrent rotator cuff tear. Path- acute inflammation and focal necrosis. Postoperatively he was started on vancomycin. His operative cultures were negative and he was discharged home on intravenous vancomycin via a PICC line on 08-26-12. It was not clear at the time of his discharge whether he had an infection or a reaction (chemical synovitis) to the materials used for his previous rotator cuff repair.  He had repeat labs 9-6 showing WBC 3.9 (ANC 2.1), Cr 0.98, Vanco 15.1.  He returned to the hospital on 09-02-12 with a fever up to 101.7 as well as feeling systemically ill. He is also noticed spontaneous drainage from one of his wound portals. He underwent repeat I and D. of his shoulder on 09/02/2012. He has been noted to have a decrease in his white blood cell count which has been attributed to vancomycin, he has been changed to daptomycin. He then developed muscle aches as well as an increase in his CK.  He was changed to ceftaroline with plan for this to stop on 10-03-12.   Had ortho f/u yesterday and was told that his wound looks good. Wound has completely healed, no d/c. No problems with PIC (except that it spontaneously came out 9-300. He was then changed to po doxy and levaquin. Has had some stomach upset yesterday, his last dose.     Review of Systems     Objective:   Physical Exam  Constitutional: He appears well-developed and well-nourished.  HENT:    Mouth/Throat: No oropharyngeal exudate.  Eyes: EOM are normal. Pupils are equal, round, and reactive to light.  Neck: Neck supple.  Cardiovascular: Normal rate, regular rhythm and normal heart sounds.   Pulmonary/Chest: Effort normal and breath sounds normal.  Abdominal: Soft. Bowel sounds are normal. He exhibits no distension.  Musculoskeletal:       Arms: Lymphadenopathy:    He has no cervical adenopathy.          Assessment & Plan:

## 2012-10-03 ENCOUNTER — Telehealth: Payer: Self-pay

## 2012-10-03 ENCOUNTER — Other Ambulatory Visit: Payer: Self-pay | Admitting: Infectious Diseases

## 2012-10-03 DIAGNOSIS — R197 Diarrhea, unspecified: Secondary | ICD-10-CM

## 2012-10-03 NOTE — H&P (Signed)
I agree with H and P dictated by Ralene Bathe pa-c

## 2012-10-03 NOTE — Telephone Encounter (Signed)
Having loose smelly stools for 2 days.   No relief with imodium.  8 stools since 3 am today.  We will need to collect stool samples.    Pt lives in [place] , I will see if I can arrange for him to pick up sample collections there.   Laurell Josephs, RN

## 2012-10-03 NOTE — Progress Notes (Signed)
Patient ID: Ian [name], male   DOB: [birth year], 51 y.o.   MRN: [id number] Pt noted to be having increased stool, will check C diff PCR

## 2012-10-04 ENCOUNTER — Other Ambulatory Visit: Payer: Self-pay | Admitting: Infectious Diseases

## 2012-10-05 ENCOUNTER — Other Ambulatory Visit: Payer: Self-pay | Admitting: Internal Medicine

## 2012-10-05 ENCOUNTER — Telehealth: Payer: Self-pay | Admitting: Internal Medicine

## 2012-10-05 DIAGNOSIS — R197 Diarrhea, unspecified: Secondary | ICD-10-CM

## 2012-10-05 MED ORDER — METRONIDAZOLE 500 MG PO TABS: 500.0000 mg | ORAL_TABLET | Freq: Three times a day (TID) | ORAL | Status: DC

## 2012-10-05 NOTE — Telephone Encounter (Signed)
Patient called stating his diarrhea is worse, he feels week. No blood in stool, no abdominal cramping, just watery diarrhea x 6 day. Cdiff PCR pending for o/p setting. Chills, but no fevers.  I have sent in RX for empiric treatment of c.difficile with flagyl 500mg  TID. Instructed to drink liquids as well as gatorade, but patient has various food intolerances. Gave the following home recipes for ORS:  [employer] ORS Recipe Shared by Talbert Nan, MD and Edgar Frisk, RPh This recipe is best when chilled in refrigerator.  Ingredients: - 3/8 tsp salt (sodium chloride) -  tsp Morton Salt Substitute (potassium chloride) -  tsp baking soda (sodium bicarbonate) - 2 tbsp + 2 tsp sugar (sucrose) - Add tap water to make one (1) liter  Grape or Cranberry Juice Base -  cup juice - 3  cups water -  tsp salt  Patient is instructed to call back tomorrow to check in. If he is worsening, instructed to go to local ED/urgent care in [facility].  Will call on Monday to arrange to be seen in clinic.

## 2012-10-07 LAB — CLOSTRIDIUM DIFFICILE BY PCR: Toxigenic C. Difficile by PCR: NOT DETECTED

## 2012-12-25 DIAGNOSIS — A498 Other bacterial infections of unspecified site: Secondary | ICD-10-CM

## 2012-12-25 HISTORY — DX: Other bacterial infections of unspecified site: A49.8

## 2013-02-20 ENCOUNTER — Encounter: Payer: Self-pay | Admitting: Family Medicine

## 2013-02-20 DIAGNOSIS — K635 Polyp of colon: Secondary | ICD-10-CM | POA: Insufficient documentation

## 2013-02-20 DIAGNOSIS — A498 Other bacterial infections of unspecified site: Secondary | ICD-10-CM | POA: Insufficient documentation

## 2013-02-20 DIAGNOSIS — S46219A Strain of muscle, fascia and tendon of other parts of biceps, unspecified arm, initial encounter: Secondary | ICD-10-CM | POA: Insufficient documentation

## 2013-02-20 DIAGNOSIS — E785 Hyperlipidemia, unspecified: Secondary | ICD-10-CM | POA: Insufficient documentation

## 2013-02-20 DIAGNOSIS — K219 Gastro-esophageal reflux disease without esophagitis: Secondary | ICD-10-CM | POA: Insufficient documentation

## 2013-02-20 DIAGNOSIS — E782 Mixed hyperlipidemia: Secondary | ICD-10-CM | POA: Insufficient documentation

## 2013-02-25 ENCOUNTER — Other Ambulatory Visit (HOSPITAL_BASED_OUTPATIENT_CLINIC_OR_DEPARTMENT_OTHER): Payer: Self-pay | Admitting: Family Medicine

## 2013-02-25 ENCOUNTER — Ambulatory Visit (HOSPITAL_BASED_OUTPATIENT_CLINIC_OR_DEPARTMENT_OTHER)
Admission: RE | Admit: 2013-02-25 | Discharge: 2013-02-25 | Disposition: A | Discharge status: Home / Self Care | Payer: No Typology Code available for payment source | Source: Ambulatory Visit | Attending: Family Medicine | Admitting: Family Medicine

## 2013-02-25 DIAGNOSIS — M79609 Pain in unspecified limb: Secondary | ICD-10-CM

## 2013-02-25 DIAGNOSIS — R52 Pain, unspecified: Secondary | ICD-10-CM

## 2013-02-25 DIAGNOSIS — M25569 Pain in unspecified knee: Secondary | ICD-10-CM | POA: Insufficient documentation

## 2013-03-31 ENCOUNTER — Other Ambulatory Visit: Payer: Self-pay | Admitting: Family Medicine

## 2013-03-31 ENCOUNTER — Other Ambulatory Visit: Payer: BC Managed Care – PPO

## 2013-03-31 DIAGNOSIS — E785 Hyperlipidemia, unspecified: Secondary | ICD-10-CM

## 2013-03-31 DIAGNOSIS — E291 Testicular hypofunction: Secondary | ICD-10-CM

## 2013-03-31 DIAGNOSIS — I10 Essential (primary) hypertension: Secondary | ICD-10-CM

## 2013-03-31 DIAGNOSIS — R5381 Other malaise: Secondary | ICD-10-CM

## 2013-03-31 LAB — COMPLETE METABOLIC PANEL WITH GFR
ALT: 33 U/L (ref 0–53)
AST: 23 U/L (ref 0–37)
Albumin: 4.2 g/dL (ref 3.5–5.2)
Alkaline Phosphatase: 44 U/L (ref 39–117)
BUN: 17 mg/dL (ref 6–23)
CO2: 26 mEq/L (ref 19–32)
Calcium: 10 mg/dL (ref 8.4–10.5)
Chloride: 102 mEq/L (ref 96–112)
Creat: 1.2 mg/dL (ref 0.50–1.35)
GFR, Est African American: 79 mL/min
GFR, Est Non African American: 69 mL/min
Glucose, Bld: 96 mg/dL (ref 70–99)
Potassium: 4.8 mEq/L (ref 3.5–5.3)
Sodium: 140 mEq/L (ref 135–145)
Total Bilirubin: 0.7 mg/dL (ref 0.3–1.2)
Total Protein: 6.6 g/dL (ref 6.0–8.3)

## 2013-03-31 LAB — CBC WITH DIFFERENTIAL/PLATELET
Basophils Absolute: 0.1 10*3/uL (ref 0.0–0.1)
Basophils Relative: 1 % (ref 0–1)
Eosinophils Absolute: 0.4 10*3/uL (ref 0.0–0.7)
Eosinophils Relative: 5 % (ref 0–5)
HCT: 47.4 % (ref 39.0–52.0)
Hemoglobin: 16.7 g/dL (ref 13.0–17.0)
Lymphocytes Relative: 37 % (ref 12–46)
Lymphs Abs: 2.7 10*3/uL (ref 0.7–4.0)
MCH: 32.1 pg (ref 26.0–34.0)
MCHC: 35.2 g/dL (ref 30.0–36.0)
MCV: 91.2 fL (ref 78.0–100.0)
Monocytes Absolute: 0.7 10*3/uL (ref 0.1–1.0)
Monocytes Relative: 9 % (ref 3–12)
Neutro Abs: 3.5 10*3/uL (ref 1.7–7.7)
Neutrophils Relative %: 48 % (ref 43–77)
Platelets: 156 10*3/uL (ref 150–400)
RBC: 5.2 MIL/uL (ref 4.22–5.81)
RDW: 14.7 % (ref 11.5–15.5)
WBC: 7.2 10*3/uL (ref 4.0–10.5)

## 2013-03-31 LAB — LIPID PANEL
Cholesterol: 176 mg/dL (ref 0–200)
HDL: 36 mg/dL — ABNORMAL LOW (ref >39)
LDL Cholesterol: 108 mg/dL — ABNORMAL HIGH (ref 0–99)
Total CHOL/HDL Ratio: 4.9 Ratio
Triglycerides: 159 mg/dL — ABNORMAL HIGH (ref <150)
VLDL: 32 mg/dL (ref 0–40)

## 2013-04-01 LAB — TESTOSTERONE, FREE, TOTAL, SHBG
Sex Hormone Binding: 26 nmol/L (ref 13–71)
Testosterone, Free: 50.2 pg/mL (ref 47.0–244.0)
Testosterone-% Free: 2.2 % (ref 1.6–2.9)
Testosterone: 229 ng/dL — ABNORMAL LOW (ref 300–890)

## 2013-04-07 ENCOUNTER — Ambulatory Visit (INDEPENDENT_AMBULATORY_CARE_PROVIDER_SITE_OTHER): Payer: BC Managed Care – PPO | Admitting: Family Medicine

## 2013-04-07 ENCOUNTER — Encounter: Payer: Self-pay | Admitting: Family Medicine

## 2013-04-07 VITALS — BP 122/78 | HR 70 | Wt 261.0 lb

## 2013-04-07 DIAGNOSIS — E785 Hyperlipidemia, unspecified: Secondary | ICD-10-CM

## 2013-04-07 DIAGNOSIS — D696 Thrombocytopenia, unspecified: Secondary | ICD-10-CM

## 2013-04-07 DIAGNOSIS — Z23 Encounter for immunization: Secondary | ICD-10-CM

## 2013-04-07 DIAGNOSIS — Z5181 Encounter for therapeutic drug level monitoring: Secondary | ICD-10-CM

## 2013-04-07 DIAGNOSIS — E291 Testicular hypofunction: Secondary | ICD-10-CM

## 2013-04-07 MED ORDER — CLOMIPHENE CITRATE 50 MG PO TABS: 50.0000 mg | ORAL_TABLET | Freq: Every day | ORAL | Status: DC

## 2013-04-07 MED ORDER — VARICELLA VIRUS VACCINE LIVE 1350 PFU/0.5ML IJ SUSR: 0.5000 mL | Freq: Once | INTRAMUSCULAR | Status: DC

## 2013-04-07 MED ORDER — FENOFIBRATE 145 MG PO TABS: 145.0000 mg | ORAL_TABLET | Freq: Every day | ORAL | Status: DC

## 2013-04-07 NOTE — Progress Notes (Signed)
Subjective:     Patient ID: Ian [name], male   DOB: 19-Mar-1961, 52 y.o.   MRN: [id number]  HPI Ian [name] is here today to go over his most recent lab results, discuss the issues below and to get refills on some of his medications.  He has done well since his last office visit.   1)  Hyperlipidemia - He has done fine on the Zetia. He does not have any muscle aches like he has had on statins in the past.    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   3)  Varicella Vaccination - He would like to get his Varicella Vaccination today.    4)  Blood Pressure - His pressure is well controlled on the lisinopril/HCT. He had developed a cough and we were wondering if it was due to the ACE vs post-nasal drip.  The cough seems to have resolved so it must not have been the ACE since he is still taking it.    Review of Systems  Constitutional: Positive for fatigue. Negative for unexpected weight change.  HENT: Positive for congestion, rhinorrhea, sneezing and postnasal drip.   Eyes: Positive for itching. Negative for discharge.  Respiratory: Negative for cough and shortness of breath.   Cardiovascular: Negative for chest pain.  Genitourinary: Negative for frequency and testicular pain.  Neurological: Negative for light-headedness.  Psychiatric/Behavioral: Negative.        Objective:   Physical Exam  Constitutional: He is oriented to person, place, and time. He appears well-nourished. No distress.  HENT:  Head: Normocephalic.  Eyes: Conjunctivae are normal. No scleral icterus.  Neck: Neck supple. No thyromegaly present.  Cardiovascular: Normal rate, regular rhythm and normal heart sounds.   Pulmonary/Chest: Effort normal and breath sounds normal.  Abdominal: He exhibits no mass. There is no tenderness.  Musculoskeletal: Normal range of motion.  Neurological: He is alert and oriented to person, place, and time.  Skin: Skin is warm. No rash noted.  Psychiatric:  He has a normal mood and affect. His behavior is normal. Judgment and thought content normal.       Assessment:     Hyperlipidemia Testicular Hypofunction Hypertension  Need for Varicella Vaccination     Plan:     1)  Adding 145 mg of fenofibrate to hopefully get his triglycerides to normal, increase his HDL and get his LDL below 409.  We'll recheck a lipid panel in 3 months.    2)  Since his testosterone level has decreased instead of going up on the Androgel, we'll let him try some Clomid and recheck his level in 3 months.    3)  He was given the Varivax and will return for his second dosage in one month.    4)  He will remain on the ACE for now since his BP is perfect and his cough has resolved.  5)  His platelets were low when we checked a CBC on him in December.  We'll recheck this at his next blood draw.

## 2013-04-07 NOTE — Patient Instructions (Addendum)
1)  Cholesterol - Your LDL has improved but...your lipid panel still needs a little tweaking.  We'll add some fenofibrate 1 pill daily to help lower your triglycerides, raise your HDL and lower your LDL to below 100.  Following a low carb diet helps the triglycerides and exercise helps raise the good cholesterol (HDL).  2)  Testosterone - Try the Clomid 50 mg per day to see if we can get your body to increase its own testosterone.    Cholesterol Cholesterol is a white, waxy, fat-like protein needed by your body in small amounts. The liver makes all the cholesterol you need. It is carried from the liver by the blood through the blood vessels. Deposits (plaque) may build up on blood vessel walls. This makes the arteries narrower and stiffer. Plaque increases the risk for heart attack and stroke. You cannot feel your cholesterol level even if it is very high. The only way to know is by a blood test to check your lipid (fats) levels. Once you know your cholesterol levels, you should keep a record of the test results. Work with your caregiver to to keep your levels in the desired range. WHAT THE RESULTS MEAN:  Total cholesterol is a rough measure of all the cholesterol in your blood.  LDL is the so-called bad cholesterol. This is the type that deposits cholesterol in the walls of the arteries. You want this level to be low.  HDL is the good cholesterol because it cleans the arteries and carries the LDL away. You want this level to be high.  Triglycerides are fat that the body can either burn for energy or store. High levels are closely linked to heart disease. DESIRED LEVELS:  Total cholesterol below 200.  LDL below 100 for people at risk, below 70 for very high risk.  HDL above 50 is good, above 60 is best.  Triglycerides below 150. HOW TO LOWER YOUR CHOLESTEROL:  Diet.  Choose fish or white meat chicken and [place], roasted or baked. Limit fatty cuts of red meat, fried foods, and processed  meats, such as sausage and lunch meat.  Eat lots of fresh fruits and vegetables. Choose whole grains, beans, pasta, potatoes and cereals.  Use only small amounts of olive, corn or canola oils. Avoid butter, mayonnaise, shortening or palm kernel oils. Avoid foods with trans-fats.  Use skim/nonfat milk and low-fat/nonfat yogurt and cheeses. Avoid whole milk, cream, ice cream, egg yolks and cheeses. Healthy desserts include angel food cake, ginger snaps, animal crackers, hard candy, popsicles, and low-fat/nonfat frozen yogurt. Avoid pastries, cakes, pies and cookies.  Exercise.  A regular program helps decrease LDL and raises HDL.  Helps with weight control.  Do things that increase your activity level like gardening, walking, or taking the stairs.  Medication.  May be prescribed by your caregiver to help lowering cholesterol and the risk for heart disease.  You may need medicine even if your levels are normal if you have several risk factors. HOME CARE INSTRUCTIONS   Follow your diet and exercise programs as suggested by your caregiver.  Take medications as directed.  Have blood work done when your caregiver feels it is necessary. MAKE SURE YOU:   Understand these instructions.  Will watch your condition.  Will get help right away if you are not doing well or get worse. Document Released: 09/05/2001 Document Revised: 03/04/2012 Document Reviewed: 02/26/2008 [facility] Patient Information 2013 [place], [place].

## 2013-05-07 ENCOUNTER — Ambulatory Visit (INDEPENDENT_AMBULATORY_CARE_PROVIDER_SITE_OTHER): Payer: Self-pay | Admitting: *Deleted

## 2013-05-07 DIAGNOSIS — Z23 Encounter for immunization: Secondary | ICD-10-CM

## 2013-05-13 DIAGNOSIS — I10 Essential (primary) hypertension: Secondary | ICD-10-CM | POA: Insufficient documentation

## 2013-06-09 ENCOUNTER — Other Ambulatory Visit: Payer: BC Managed Care – PPO

## 2013-06-16 ENCOUNTER — Ambulatory Visit: Payer: BC Managed Care – PPO | Admitting: Family Medicine

## 2013-06-24 HISTORY — PX: HEMORRHOID SURGERY: SHX153

## 2013-06-24 HISTORY — PX: OTHER SURGICAL HISTORY: SHX169

## 2013-06-24 HISTORY — PX: ANAL FISSURE REPAIR: SHX2312

## 2013-07-18 ENCOUNTER — Other Ambulatory Visit: Payer: Self-pay | Admitting: *Deleted

## 2013-07-18 DIAGNOSIS — E785 Hyperlipidemia, unspecified: Secondary | ICD-10-CM

## 2013-07-18 DIAGNOSIS — D696 Thrombocytopenia, unspecified: Secondary | ICD-10-CM

## 2013-07-18 DIAGNOSIS — Z5181 Encounter for therapeutic drug level monitoring: Secondary | ICD-10-CM

## 2013-07-18 DIAGNOSIS — E291 Testicular hypofunction: Secondary | ICD-10-CM

## 2013-07-21 ENCOUNTER — Other Ambulatory Visit: Payer: Self-pay

## 2013-07-21 LAB — CBC WITH DIFFERENTIAL/PLATELET
Basophils Absolute: 0.1 10*3/uL (ref 0.0–0.1)
Basophils Relative: 1 % (ref 0–1)
Eosinophils Absolute: 0.3 10*3/uL (ref 0.0–0.7)
Eosinophils Relative: 4 % (ref 0–5)
HCT: 48.5 % (ref 39.0–52.0)
Hemoglobin: 17.2 g/dL — ABNORMAL HIGH (ref 13.0–17.0)
Lymphocytes Relative: 32 % (ref 12–46)
Lymphs Abs: 2.7 10*3/uL (ref 0.7–4.0)
MCH: 32 pg (ref 26.0–34.0)
MCHC: 35.5 g/dL (ref 30.0–36.0)
MCV: 90.3 fL (ref 78.0–100.0)
Monocytes Absolute: 0.9 10*3/uL (ref 0.1–1.0)
Monocytes Relative: 10 % (ref 3–12)
Neutro Abs: 4.5 10*3/uL (ref 1.7–7.7)
Neutrophils Relative %: 53 % (ref 43–77)
Platelets: 126 10*3/uL — ABNORMAL LOW (ref 150–400)
RBC: 5.37 MIL/uL (ref 4.22–5.81)
RDW: 13.8 % (ref 11.5–15.5)
WBC: 8.5 10*3/uL (ref 4.0–10.5)

## 2013-07-21 LAB — COMPREHENSIVE METABOLIC PANEL
ALT: 29 U/L (ref 0–53)
AST: 24 U/L (ref 0–37)
Albumin: 4.1 g/dL (ref 3.5–5.2)
Alkaline Phosphatase: 35 U/L — ABNORMAL LOW (ref 39–117)
BUN: 16 mg/dL (ref 6–23)
CO2: 30 mEq/L (ref 19–32)
Calcium: 9.5 mg/dL (ref 8.4–10.5)
Chloride: 101 mEq/L (ref 96–112)
Creat: 1.27 mg/dL (ref 0.50–1.35)
Glucose, Bld: 97 mg/dL (ref 70–99)
Potassium: 4.3 mEq/L (ref 3.5–5.3)
Sodium: 138 mEq/L (ref 135–145)
Total Bilirubin: 0.7 mg/dL (ref 0.3–1.2)
Total Protein: 6.6 g/dL (ref 6.0–8.3)

## 2013-07-21 LAB — LIPID PANEL
Cholesterol: 148 mg/dL (ref 0–200)
HDL: 34 mg/dL — ABNORMAL LOW (ref >39)
LDL Cholesterol: 60 mg/dL (ref 0–99)
Total CHOL/HDL Ratio: 4.4 Ratio
Triglycerides: 269 mg/dL — ABNORMAL HIGH (ref <150)
VLDL: 54 mg/dL — ABNORMAL HIGH (ref 0–40)

## 2013-07-22 LAB — TESTOSTERONE, FREE, TOTAL, SHBG
Sex Hormone Binding: 38 nmol/L (ref 13–71)
Testosterone, Free: 214.6 pg/mL (ref 47.0–244.0)
Testosterone-% Free: 2.2 % (ref 1.6–2.9)
Testosterone: 971 ng/dL — ABNORMAL HIGH (ref 300–890)

## 2013-07-27 ENCOUNTER — Other Ambulatory Visit: Payer: Self-pay | Admitting: Family Medicine

## 2013-07-28 ENCOUNTER — Ambulatory Visit (INDEPENDENT_AMBULATORY_CARE_PROVIDER_SITE_OTHER): Payer: BC Managed Care – PPO | Admitting: Family Medicine

## 2013-07-28 ENCOUNTER — Encounter: Payer: Self-pay | Admitting: Family Medicine

## 2013-07-28 VITALS — BP 142/84 | HR 89 | Resp 16 | Ht 67.8 in | Wt 270.0 lb

## 2013-07-28 DIAGNOSIS — D696 Thrombocytopenia, unspecified: Secondary | ICD-10-CM

## 2013-07-28 DIAGNOSIS — E291 Testicular hypofunction: Secondary | ICD-10-CM

## 2013-07-28 DIAGNOSIS — E785 Hyperlipidemia, unspecified: Secondary | ICD-10-CM

## 2013-07-28 DIAGNOSIS — I10 Essential (primary) hypertension: Secondary | ICD-10-CM

## 2013-07-28 MED ORDER — LISINOPRIL-HYDROCHLOROTHIAZIDE 20-12.5 MG PO TABS: 1.0000 | ORAL_TABLET | Freq: Two times a day (BID) | ORAL | Status: DC

## 2013-07-28 MED ORDER — EZETIMIBE 10 MG PO TABS: 10.0000 mg | ORAL_TABLET | Freq: Every day | ORAL | Status: DC

## 2013-07-28 MED ORDER — OMEGA-3-ACID ETHYL ESTERS 1 G PO CAPS: 2.0000 g | ORAL_CAPSULE | Freq: Two times a day (BID) | ORAL | Status: DC

## 2013-07-28 NOTE — Patient Instructions (Addendum)
1)  Cholesterol - Continue on the Zetia and add Fish Oil vs Lovaza to decrease your triglycerides.   2)  Hypotesticular Function - Hold the Clomid and F/U with a urologist.     Triglycerides, TG, TRIG This is a test to check your risk of developing heart disease. It is often done as part of a lipid profile during a regular medical exam or if you are being treated for high triglycerides. This test measures the amount of triglycerides in your blood. Triglycerides are the body's storage form for fat. Most triglycerides are found in fat tissue. Some triglycerides circulate in the blood to provide fuel for muscles to work. Extra triglycerides are found in the blood after eating a meal when fat is being sent from the gut to fat tissue for storage. The test for triglycerides should be done when you are fasting and no extra triglycerides from a recent meal are present.  SAMPLE COLLECTION The test for triglycerides uses a blood sample. Most often, the blood sample is collected using a needle to collect blood from a vein. Sometimes triglycerides are measured using a drop of blood collected by puncturing the skin on a finger. Testing should be done when you are fasting. For 12 to 14 hours before the test, only water is permitted. In addition, alcohol should not be consumed for the 24 hours just before the test. If you are diabetic and your blood sugar is out of control, triglycerides will be very high. NORMAL FINDINGS  Adult/elderly  Male: 40-160 mg/dL or 1.61-0.96 mmol/L (SI units)  Male: 35-135 mg/dL or 0.45-4.09 mmol/L (SI units)  0-54 years  Male: 30-86 mg/dL  Male: 81-19 mg/dL  1-54 years  Male: 82-956 mg/dL  Male: 21-308 mg/dL  65-54 years  Male: 46-962 mg/dL  Male: 95-284 mg/dL  13-54 years  Male: 40-102 mg/dL  Male: 72-536 mg/dL Ranges for normal findings may vary among different laboratories and hospitals. You should always check with your doctor after having lab work  or other tests done to discuss the meaning of your test results and whether your values are considered within normal limits. MEANING OF TEST  Your caregiver will go over the test results with you and discuss the importance and meaning of your results, as well as treatment options and the need for additional tests if necessary. OBTAINING THE TEST RESULTS It is your responsibility to obtain your test results. Ask the lab or department performing the test when and how you will get your results. Document Released: 01/13/2005 Document Revised: 03/04/2012 Document Reviewed: 11/22/2008 [facility] Patient Information 2014 [place], [place].

## 2013-07-28 NOTE — Progress Notes (Signed)
  Subjective:    Patient ID: Ian [name], male    DOB: September 17, 1961, 52 y.o.   MRN: [id number]  HPI  Ian [name] is here today to go over his recent lab results, get medication refills and to discuss the conditions listed below:    1)  Hyperlipidemia: He is taking Zetia daily but does admit to not watching his diet very carefully.    2)  Hypertension: He is taking lisinopril/HCTZ and his blood pressure usually looks good. He recently had hemorrhoid surgery and feels that his elevated BP could be related to this.    3)  GERD: He does well on the omeprazole.  4)  Low Testosterone:  He has been taking Clomid since his last visit.     Review of Systems  Constitutional: Negative.   HENT: Negative.   Cardiovascular:       Elevated blood pressure  Gastrointestinal: Negative for anal bleeding.       Recently had Hemorrhoid surgery  Musculoskeletal: Negative.   Skin: Negative.   Allergic/Immunologic: Negative.   Neurological: Negative.   Hematological: Negative.   Psychiatric/Behavioral: Negative.     Past Medical History  Diagnosis Date  . Internal bleeding hemorrhoids 2012    "might bleed once/month; only when I strain"  . Hyperlipidemia   . Clostridium difficile infection   . Testicular hypofunction   . Biceps tendon rupture     bilateral  . GERD (gastroesophageal reflux disease)   . Colon polyps   . Allergy     Family History  Problem Relation Age of Onset  . Heart disease Mother   . Hypertension Mother   . Hyperlipidemia Mother   . Heart disease Father   . CVA Father   . Hypertension Maternal Grandmother   . Hyperlipidemia Maternal Grandmother   . Cancer Maternal Grandmother     Colon Cancer  . Heart disease Maternal Grandfather   . Hypertension Maternal Grandfather   . Hyperlipidemia Maternal Grandfather   . Cancer Maternal Grandfather     Lung Cancer  . Asthma Paternal Grandmother    History   Social History Narrative   Marital Status: Married [employer])    Children:  Step Daughter Ian [name])    Pets: None    Living Situation: Lives with Ian [name]    Occupation: [employer])    Education: Associate's Degree in [employer]    Tobacco Use/Exposure:  None    Alcohol Use:  Rarely    Drug Use:  None   Diet:  Regular   Exercise: Limited     Hobbies:  Hunting, Fishing                Objective:   Physical Exam  Constitutional: He is oriented to person, place, and time. He appears well-nourished. No distress.  HENT:  Head: Normocephalic.  Eyes: No scleral icterus.  Neck: Neck supple. No thyromegaly present.  Cardiovascular: Normal rate, regular rhythm and normal heart sounds.  Exam reveals no gallop and no friction rub.   No murmur heard. Pulmonary/Chest: Breath sounds normal. No respiratory distress. He exhibits no tenderness.  Musculoskeletal: He exhibits no edema.  Neurological: He is alert and oriented to person, place, and time.  Skin: Skin is warm and dry. No rash noted.  Psychiatric: He has a normal mood and affect. His behavior is normal. Judgment and thought content normal.     Assessment & Plan:

## 2013-07-30 ENCOUNTER — Ambulatory Visit: Payer: BC Managed Care – PPO | Attending: Physician Assistant | Admitting: Rehabilitation

## 2013-07-30 DIAGNOSIS — M545 Low back pain, unspecified: Secondary | ICD-10-CM | POA: Insufficient documentation

## 2013-07-30 DIAGNOSIS — IMO0001 Reserved for inherently not codable concepts without codable children: Secondary | ICD-10-CM | POA: Insufficient documentation

## 2013-07-30 DIAGNOSIS — M2550 Pain in unspecified joint: Secondary | ICD-10-CM | POA: Insufficient documentation

## 2013-08-04 ENCOUNTER — Ambulatory Visit: Payer: BC Managed Care – PPO | Admitting: Rehabilitation

## 2013-08-06 ENCOUNTER — Ambulatory Visit: Payer: BC Managed Care – PPO | Admitting: Rehabilitation

## 2013-08-11 ENCOUNTER — Ambulatory Visit: Payer: BC Managed Care – PPO | Admitting: Rehabilitation

## 2013-08-12 ENCOUNTER — Other Ambulatory Visit: Payer: Self-pay | Admitting: Physical Medicine and Rehabilitation

## 2013-08-12 DIAGNOSIS — R9389 Abnormal findings on diagnostic imaging of other specified body structures: Secondary | ICD-10-CM

## 2013-08-13 ENCOUNTER — Ambulatory Visit
Admission: RE | Admit: 2013-08-13 | Discharge: 2013-08-13 | Disposition: A | Discharge status: Home / Self Care | Payer: BC Managed Care – PPO | Source: Ambulatory Visit | Attending: Physical Medicine and Rehabilitation | Admitting: Physical Medicine and Rehabilitation

## 2013-08-13 ENCOUNTER — Ambulatory Visit: Payer: BC Managed Care – PPO | Admitting: Rehabilitation

## 2013-08-13 ENCOUNTER — Other Ambulatory Visit: Payer: BC Managed Care – PPO

## 2013-08-13 DIAGNOSIS — R9389 Abnormal findings on diagnostic imaging of other specified body structures: Secondary | ICD-10-CM

## 2013-08-18 ENCOUNTER — Ambulatory Visit: Payer: BC Managed Care – PPO | Admitting: Rehabilitation

## 2013-08-20 ENCOUNTER — Ambulatory Visit: Payer: BC Managed Care – PPO | Admitting: Rehabilitation

## 2013-09-13 DIAGNOSIS — E291 Testicular hypofunction: Secondary | ICD-10-CM | POA: Insufficient documentation

## 2013-09-13 DIAGNOSIS — I10 Essential (primary) hypertension: Secondary | ICD-10-CM | POA: Insufficient documentation

## 2013-09-13 DIAGNOSIS — D696 Thrombocytopenia, unspecified: Secondary | ICD-10-CM | POA: Insufficient documentation

## 2013-09-13 DIAGNOSIS — E785 Hyperlipidemia, unspecified: Secondary | ICD-10-CM | POA: Insufficient documentation

## 2013-09-13 NOTE — Assessment & Plan Note (Signed)
His testosterone level is high on the Clomid which probably explains why his hemoglobin is elevated.  He really does not feel any different with the higher testosterone level so he is to stop the Clomid.  I have asked him to follow up with a urologist.

## 2013-09-13 NOTE — Assessment & Plan Note (Signed)
His BP is a little elevated.  This may be because of pain related to his recent hemorrhoidal surgery vs his elevated testosterone.  This should improve as he recovers and stops the Clomid.

## 2013-09-13 NOTE — Assessment & Plan Note (Addendum)
His LDL is perfect but his triglycerides are too high.  He will continue on his Zetia and will take either Lovaza vs Fish Oil.

## 2013-10-30 ENCOUNTER — Other Ambulatory Visit: Payer: Self-pay

## 2013-12-25 HISTORY — PX: OTHER SURGICAL HISTORY: SHX169

## 2014-01-31 ENCOUNTER — Other Ambulatory Visit: Payer: Self-pay | Admitting: Family Medicine

## 2014-02-03 ENCOUNTER — Ambulatory Visit: Payer: BC Managed Care – PPO | Admitting: Family Medicine

## 2014-02-11 ENCOUNTER — Other Ambulatory Visit: Payer: Self-pay | Admitting: Family Medicine

## 2014-02-20 ENCOUNTER — Other Ambulatory Visit: Payer: Self-pay | Admitting: *Deleted

## 2014-02-20 DIAGNOSIS — E785 Hyperlipidemia, unspecified: Secondary | ICD-10-CM

## 2014-02-20 DIAGNOSIS — R5381 Other malaise: Secondary | ICD-10-CM

## 2014-02-20 DIAGNOSIS — R5383 Other fatigue: Principal | ICD-10-CM

## 2014-02-23 ENCOUNTER — Other Ambulatory Visit: Payer: BC Managed Care – PPO

## 2014-02-23 LAB — LIPID PANEL
Cholesterol: 155 mg/dL (ref 0–200)
HDL: 28 mg/dL — ABNORMAL LOW (ref >39)
Total CHOL/HDL Ratio: 5.5 Ratio
Triglycerides: 588 mg/dL — ABNORMAL HIGH (ref <150)

## 2014-02-23 LAB — CBC WITH DIFFERENTIAL/PLATELET
Basophils Absolute: 0.1 10*3/uL (ref 0.0–0.1)
Basophils Relative: 1 % (ref 0–1)
Eosinophils Absolute: 0.3 10*3/uL (ref 0.0–0.7)
Eosinophils Relative: 4 % (ref 0–5)
HCT: 46.5 % (ref 39.0–52.0)
Hemoglobin: 16.3 g/dL (ref 13.0–17.0)
Lymphocytes Relative: 27 % (ref 12–46)
Lymphs Abs: 2.2 10*3/uL (ref 0.7–4.0)
MCH: 32.9 pg (ref 26.0–34.0)
MCHC: 35.1 g/dL (ref 30.0–36.0)
MCV: 93.9 fL (ref 78.0–100.0)
Monocytes Absolute: 0.6 10*3/uL (ref 0.1–1.0)
Monocytes Relative: 8 % (ref 3–12)
Neutro Abs: 4.9 10*3/uL (ref 1.7–7.7)
Neutrophils Relative %: 60 % (ref 43–77)
Platelets: 119 10*3/uL — ABNORMAL LOW (ref 150–400)
RBC: 4.95 MIL/uL (ref 4.22–5.81)
RDW: 14.4 % (ref 11.5–15.5)
WBC: 8.1 10*3/uL (ref 4.0–10.5)

## 2014-02-23 LAB — COMPLETE METABOLIC PANEL WITH GFR
ALT: 25 U/L (ref 0–53)
AST: 27 U/L (ref 0–37)
Albumin: 3.8 g/dL (ref 3.5–5.2)
Alkaline Phosphatase: 39 U/L (ref 39–117)
BUN: 13 mg/dL (ref 6–23)
CO2: 30 mEq/L (ref 19–32)
Calcium: 9 mg/dL (ref 8.4–10.5)
Chloride: 99 mEq/L (ref 96–112)
Creat: 0.97 mg/dL (ref 0.50–1.35)
GFR, Est African American: >89 mL/min
GFR, Est Non African American: 88 mL/min
Glucose, Bld: 112 mg/dL — ABNORMAL HIGH (ref 70–99)
Potassium: 4.1 mEq/L (ref 3.5–5.3)
Sodium: 136 mEq/L (ref 135–145)
Total Bilirubin: 1 mg/dL (ref 0.2–1.2)
Total Protein: 6.2 g/dL (ref 6.0–8.3)

## 2014-03-02 ENCOUNTER — Encounter: Payer: Self-pay | Admitting: Family Medicine

## 2014-03-02 ENCOUNTER — Ambulatory Visit (INDEPENDENT_AMBULATORY_CARE_PROVIDER_SITE_OTHER): Payer: BC Managed Care – PPO | Admitting: Family Medicine

## 2014-03-02 VITALS — BP 144/91 | HR 80 | Resp 16 | Wt 291.0 lb

## 2014-03-02 DIAGNOSIS — K219 Gastro-esophageal reflux disease without esophagitis: Secondary | ICD-10-CM

## 2014-03-02 DIAGNOSIS — E785 Hyperlipidemia, unspecified: Secondary | ICD-10-CM

## 2014-03-02 DIAGNOSIS — I10 Essential (primary) hypertension: Secondary | ICD-10-CM

## 2014-03-02 DIAGNOSIS — E119 Type 2 diabetes mellitus without complications: Secondary | ICD-10-CM

## 2014-03-02 DIAGNOSIS — E781 Pure hyperglyceridemia: Secondary | ICD-10-CM

## 2014-03-02 LAB — POCT GLYCOSYLATED HEMOGLOBIN (HGB A1C): Hemoglobin A1C: 6.9

## 2014-03-02 MED ORDER — EZETIMIBE 10 MG PO TABS: 10.0000 mg | ORAL_TABLET | Freq: Every day | ORAL | Status: DC

## 2014-03-02 MED ORDER — OMEPRAZOLE 20 MG PO CPDR: 20.0000 mg | DELAYED_RELEASE_CAPSULE | Freq: Every day | ORAL | Status: DC

## 2014-03-02 MED ORDER — METFORMIN HCL ER 500 MG PO TB24: 500.0000 mg | ORAL_TABLET | Freq: Every day | ORAL | Status: DC

## 2014-03-02 MED ORDER — LISINOPRIL-HYDROCHLOROTHIAZIDE 20-12.5 MG PO TABS: 1.0000 | ORAL_TABLET | Freq: Two times a day (BID) | ORAL | Status: DC

## 2014-03-02 MED ORDER — FENOFIBRATE 145 MG PO TABS: 145.0000 mg | ORAL_TABLET | Freq: Every day | ORAL | Status: DC

## 2014-03-02 MED ORDER — OMEGA-3-ACID ETHYL ESTERS 1 G PO CAPS: 2.0000 g | ORAL_CAPSULE | Freq: Two times a day (BID) | ORAL | Status: DC

## 2014-03-02 NOTE — Progress Notes (Signed)
Subjective:    Patient ID: Ian [name], male    DOB: 1961/01/23, 53 y.o.   MRN: [id number]  HPI  Ian [name] is here today to go over his most recent lab results and to discuss the condition listed below:   1)  Hypertension - He is doing well with Lisinorpril/HCTZ (20-12.5 mg 1-2 pills daily).  He needs a refill.      2)  GERD - His acid reflux is well controlled with Omeprazole (20 mg daily).  He needs a refill on it.    3)  Hyperlipidemia - He needs a refill on his Zetia.  He takes this pill in the morning and wonders if this medication is the reason of his fatigue in the morning.     Review of Systems  Constitutional: Positive for fatigue. Negative for unexpected weight change.  HENT: Negative.   Respiratory: Negative for shortness of breath.   Cardiovascular: Negative for chest pain, palpitations and leg swelling.  Gastrointestinal: Negative.   Genitourinary: Negative.   Musculoskeletal: Negative for myalgias.  Skin: Negative.   Neurological: Negative.   Psychiatric/Behavioral: Negative.     Past Medical History  Diagnosis Date  . Internal bleeding hemorrhoids 2012    "might bleed once/month; only when I strain"  . Hyperlipidemia   . Clostridium difficile infection   . Testicular hypofunction   . Biceps tendon rupture     bilateral  . GERD (gastroesophageal reflux disease)   . Colon polyps   . Allergy   . Arthritis   . Hypertension   . Diabetes     Borderline/Pre-diabetes  . Leg cramps      Past Surgical History  Procedure Laterality Date  . Shoulder arthroscopy w/ rotator cuff repair  07/09/2012    left  . Appendectomy  2011  . Colonoscopy w/ biopsies and polypectomy  2012  . Shoulder arthroscopy  08/22/2012    Procedure: ARTHROSCOPY SHOULDER;  Surgeon: Marin Shutter, MD;  Location: [place];  Service: Orthopedics;  Laterality: Left;  Left shoulder arthroscopic lavage and debridement  . Shoulder arthroscopy  09/02/2012    Procedure: ARTHROSCOPY SHOULDER;   Surgeon: Marin Shutter, MD;  Location: [place];  Service: Orthopedics;  Laterality: Left;  Left shoulder arthroscopy irrigation and debridement  . Fissurectomy    . Excisional hemorrhoidectomy    . Internal sphincterotomy    . Hemorrhoid surgery  06/24/2013  . Anal fissure repair  06/24/2013  . Wisdom tooth extraction    . Skin lesion excision      Benign     History   Social History Narrative   Marital Status: Married Pamala Hurry)    Children:  Step Daughter Sharyn Lull)    Pets: None    Living Situation: Lives with Pamala Hurry    Occupation: [employer]    Education: Associate's Degree in [employer]    Tobacco Use/Exposure:  None    Alcohol Use:  Rarely    Drug Use:  None   Diet:  Regular   Exercise: Limited     Hobbies:  Hunting, Fishing               Family History  Problem Relation Age of Onset  . Heart disease Mother 32    Deceased  . Hypertension Mother   . Hyperlipidemia Mother   . Heart disease Father 27    Deceased  . CVA Father   . Hypertension Maternal Grandmother   . Hyperlipidemia Maternal Grandmother   .  Cancer Maternal Grandmother     Colon Cancer  . Heart disease Maternal Grandfather   . Hypertension Maternal Grandfather   . Hyperlipidemia Maternal Grandfather   . Cancer Maternal Grandfather     Lung Cancer  . Asthma Paternal Grandmother   . Heart disease Paternal Grandfather      Current Outpatient Prescriptions on File Prior to Visit  Medication Sig Dispense Refill  . HYDROcodone-acetaminophen (NORCO/VICODIN) 5-325 MG per tablet        No current facility-administered medications on file prior to visit.     Allergies  Allergen Reactions  . Asa [Aspirin] Nausea Only and Rash  . Penicillins Rash  . Pork-Derived Products Diarrhea and Nausea And Vomiting    PER PATIENT REQUEST DELETE, ALLERGIST RESOLVED  . Tetanus Toxoids Anaphylaxis  . Beef-Derived Products Nausea And Vomiting    PER PATIENT REQUEST, DELETE, ALLERGIST  RESOLVED  . Daptomycin Other (See Comments)    myalgias  . Prednisone (Pak) [Prednisone] Other (See Comments)    Hyperactivity     Immunization History  Administered Date(s) Administered  . Influenza Split 10/02/2012  . Varicella 04/07/2013, 05/07/2013       Objective:   Physical Exam  Constitutional: He is oriented to person, place, and time. He appears well-nourished. No distress.  HENT:  Head: Normocephalic.  Eyes: No scleral icterus.  Neck: Neck supple. No thyromegaly present.  Cardiovascular: Normal rate, regular rhythm and normal heart sounds.   Pulmonary/Chest: Effort normal and breath sounds normal.  Musculoskeletal: Normal range of motion. He exhibits no edema.  Neurological: He is alert and oriented to person, place, and time.  Skin: Skin is warm and dry. No rash noted.  Psychiatric: He has a normal mood and affect. His behavior is normal. Judgment and thought content normal.       Assessment & Plan:    Ian [name] was seen today for medication management.  Diagnoses and associated orders for this visit:  GERD (gastroesophageal reflux disease) - omeprazole (PRILOSEC) 20 MG capsule; Take 1 capsule (20 mg total) by mouth daily.  Other and unspecified hyperlipidemia -     ezetimibe (ZETIA) 10 MG tablet; Take 1 tablet (10 mg total) by mouth daily. - omega-3 acid ethyl esters (LOVAZA) 1 G capsule; Take 2 capsules (2 g total) by mouth 2 (two) times daily.  Essential hypertension, benign - lisinopril-hydrochlorothiazide (PRINZIDE,ZESTORETIC) 20-12.5 MG per tablet; Take 1 tablet by mouth 2 (two) times daily.  High blood triglycerides -  fenofibrate (TRICOR) 145 MG tablet; Take 1 tablet (145 mg total) by mouth daily. - gemfibrozil (LOPID) 600 MG tablet; Take 1 tablet (600 mg total) by mouth 2 (two) times daily before a meal.  Type II or unspecified type diabetes mellitus without mention of complication, not stated as uncontrolled - POCT glycosylated hemoglobin (Hb  A1C) - metFORMIN (GLUCOPHAGE-XR) 500 MG 24 hr tablet; Take 1 tablet (500 mg total) by mouth at bedtime.   TIME SPENT "FACE TO FACE" WITH PATIENT -  24 MINS   .

## 2014-03-02 NOTE — Patient Instructions (Signed)
1)  Blood Sugar - Limit your "bad" carbs and increase your exercise.  You might consider reading the book "The End of Diabetes" by Dr. Excell Seltzer.  Our first goal for your A1c is <6% with our ultimate goal being <5.7%.  Start on 1 metformin at bedtime and increase to 2 at bedtime.    2)  Triglycerides - Your level is very high so we are going to add some Tricor to lower this value.  We'll recheck your labs in 3 months.    3)  BP - Too high - Weight loss will help.    Hypertriglyceridemia  Diet for High blood levels of Triglycerides Most fats in food are triglycerides. Triglycerides in your blood are stored as fat in your body. High levels of triglycerides in your blood may put you at a greater risk for heart disease and stroke.  Normal triglyceride levels are less than 150 mg/dL. Borderline high levels are 150-199 mg/dl. High levels are 200 - 499 mg/dL, and very high triglyceride levels are greater than 500 mg/dL. The decision to treat high triglycerides is generally based on the level. For people with borderline or high triglyceride levels, treatment includes weight loss and exercise. Drugs are recommended for people with very high triglyceride levels. Many people who need treatment for high triglyceride levels have metabolic syndrome. This syndrome is a collection of disorders that often include: insulin resistance, high blood pressure, blood clotting problems, high cholesterol and triglycerides. TESTING PROCEDURE FOR TRIGLYCERIDES  You should not eat 4 hours before getting your triglycerides measured. The normal range of triglycerides is between 10 and 250 milligrams per deciliter (mg/dl). Some people may have extreme levels (1000 or above), but your triglyceride level may be too high if it is above 150 mg/dl, depending on what other risk factors you have for heart disease.  People with high blood triglycerides may also have high blood cholesterol levels. If you have high blood cholesterol as  well as high blood triglycerides, your risk for heart disease is probably greater than if you only had high triglycerides. High blood cholesterol is one of the main risk factors for heart disease. CHANGING YOUR DIET  Your weight can affect your blood triglyceride level. If you are more than 20% above your ideal body weight, you may be able to lower your blood triglycerides by losing weight. Eating less and exercising regularly is the best way to combat this. Fat provides more calories than any other food. The best way to lose weight is to eat less fat. Only 30% of your total calories should come from fat. Less than 7% of your diet should come from saturated fat. A diet low in fat and saturated fat is the same as a diet to decrease blood cholesterol. By eating a diet lower in fat, you may lose weight, lower your blood cholesterol, and lower your blood triglyceride level.  Eating a diet low in fat, especially saturated fat, may also help you lower your blood triglyceride level. Ask your dietitian to help you figure how much fat you can eat based on the number of calories your caregiver has prescribed for you.  Exercise, in addition to helping with weight loss may also help lower triglyceride levels.   Alcohol can increase blood triglycerides. You may need to stop drinking alcoholic beverages.  Too much carbohydrate in your diet may also increase your blood triglycerides. Some complex carbohydrates are necessary in your diet. These may include bread, rice, potatoes, other starchy vegetables  and cereals.  Reduce "simple" carbohydrates. These may include pure sugars, candy, honey, and jelly without losing other nutrients. If you have the kind of high blood triglycerides that is affected by the amount of carbohydrates in your diet, you will need to eat less sugar and less high-sugar foods. Your caregiver can help you with this.  Adding 2-4 grams of fish oil (EPA+ DHA) may also help lower triglycerides. Speak  with your caregiver before adding any supplements to your regimen. Following the Diet  Maintain your ideal weight. Your caregivers can help you with a diet. Generally, eating less food and getting more exercise will help you lose weight. Joining a weight control group may also help. Ask your caregivers for a good weight control group in your area.  Eat low-fat foods instead of high-fat foods. This can help you lose weight too.  These foods are lower in fat. Eat MORE of these:   Dried beans, peas, and lentils.  Egg whites.  Low-fat cottage cheese.  Fish.  Lean cuts of meat, such as round, sirloin, rump, and flank (cut extra fat off meat you fix).  Whole grain breads, cereals and pasta.  Skim and nonfat dry milk.  Low-fat yogurt.  Poultry without the skin.  Cheese made with skim or part-skim milk, such as mozzarella, parmesan, farmers', ricotta, or pot cheese. These are higher fat foods. Eat LESS of these:   Whole milk and foods made from whole milk, such as American, blue, cheddar, monterey jack, and swiss cheese  High-fat meats, such as luncheon meats, sausages, knockwurst, bratwurst, hot dogs, ribs, corned beef, ground pork, and regular ground beef.  Fried foods. Limit saturated fats in your diet. Substituting unsaturated fat for saturated fat may decrease your blood triglyceride level. You will need to read package labels to know which products contain saturated fats.  These foods are high in saturated fat. Eat LESS of these:   Fried pork skins.  Whole milk.  Skin and fat from poultry.  Palm oil.  Butter.  Shortening.  Cream cheese.  Tomasa Blase.  Margarines and baked goods made from listed oils.  Vegetable shortenings.  Chitterlings.  Fat from meats.  Coconut oil.  Palm kernel oil.  Lard.  Cream.  Sour cream.  Fatback.  Coffee whiteners and non-dairy creamers made with these oils.  Cheese made from whole milk. Use unsaturated fats (both  polyunsaturated and monounsaturated) moderately. Remember, even though unsaturated fats are better than saturated fats; you still want a diet low in total fat.  These foods are high in unsaturated fat:   Canola oil.  Sunflower oil.  Mayonnaise.  Almonds.  Peanuts.  Pine nuts.  Margarines made with these oils.  Safflower oil.  Olive oil.  Avocados.  Cashews.  Peanut butter.  Sunflower seeds.  Soybean oil.  Peanut oil.  Olives.  Pecans.  Walnuts.  Pumpkin seeds. Avoid sugar and other high-sugar foods. This will decrease carbohydrates without decreasing other nutrients. Sugar in your food goes rapidly to your blood. When there is excess sugar in your blood, your liver may use it to make more triglycerides. Sugar also contains calories without other important nutrients.  Eat LESS of these:   Sugar, brown sugar, powdered sugar, jam, jelly, preserves, honey, syrup, molasses, pies, candy, cakes, cookies, frosting, pastries, colas, soft drinks, punches, fruit drinks, and regular gelatin.  Avoid alcohol. Alcohol, even more than sugar, may increase blood triglycerides. In addition, alcohol is high in calories and low in nutrients. Ask for sparkling  water, or a diet soft drink instead of an alcoholic beverage. Suggestions for planning and preparing meals   Bake, broil, grill or roast meats instead of frying.  Remove fat from meats and skin from poultry before cooking.  Add spices, herbs, lemon juice or vinegar to vegetables instead of salt, rich sauces or gravies.  Use a non-stick skillet without fat or use no-stick sprays.  Cool and refrigerate stews and broth. Then remove the hardened fat floating on the surface before serving.  Refrigerate meat drippings and skim off fat to make low-fat gravies.  Serve more fish.  Use less butter, margarine and other high-fat spreads on bread or vegetables.  Use skim or reconstituted non-fat dry milk for cooking.  Cook with  low-fat cheeses.  Substitute low-fat yogurt or cottage cheese for all or part of the sour cream in recipes for sauces, dips or congealed salads.  Use half yogurt/half mayonnaise in salad recipes.  Substitute evaporated skim milk for cream. Evaporated skim milk or reconstituted non-fat dry milk can be whipped and substituted for whipped cream in certain recipes.  Choose fresh fruits for dessert instead of high-fat foods such as pies or cakes. Fruits are naturally low in fat. When Dining Out   Order low-fat appetizers such as fruit or vegetable juice, pasta with vegetables or tomato sauce.  Select clear, rather than cream soups.  Ask that dressings and gravies be served on the side. Then use less of them.  Order foods that are baked, broiled, poached, steamed, stir-fried, or roasted.  Ask for margarine instead of butter, and use only a small amount.  Drink sparkling water, unsweetened tea or coffee, or diet soft drinks instead of alcohol or other sweet beverages. QUESTIONS AND ANSWERS ABOUT OTHER FATS IN THE BLOOD: SATURATED FAT, TRANS FAT, AND CHOLESTEROL What is trans fat? Trans fat is a type of fat that is formed when vegetable oil is hardened through a process called hydrogenation. This process helps makes foods more solid, gives them shape, and prolongs their shelf life. Trans fats are also called hydrogenated or partially hydrogenated oils.  What do saturated fat, trans fat, and cholesterol in foods have to do with heart disease? Saturated fat, trans fat, and cholesterol in the diet all raise the level of LDL "bad" cholesterol in the blood. The higher the LDL cholesterol, the greater the risk for coronary heart disease (CHD). Saturated fat and trans fat raise LDL similarly.  What foods contain saturated fat, trans fat, and cholesterol? High amounts of saturated fat are found in animal products, such as fatty cuts of meat, chicken skin, and full-fat dairy products like butter, whole  milk, cream, and cheese, and in tropical vegetable oils such as palm, palm kernel, and coconut oil. Trans fat is found in some of the same foods as saturated fat, such as vegetable shortening, some margarines (especially hard or stick margarine), crackers, cookies, baked goods, fried foods, salad dressings, and other processed foods made with partially hydrogenated vegetable oils. Small amounts of trans fat also occur naturally in some animal products, such as milk products, beef, and lamb. Foods high in cholesterol include liver, other organ meats, egg yolks, shrimp, and full-fat dairy products. How can I use the new food label to make heart-healthy food choices? Check the Nutrition Facts panel of the food label. Choose foods lower in saturated fat, trans fat, and cholesterol. For saturated fat and cholesterol, you can also use the Percent Daily Value (%DV): 5% DV or less is low,  and 20% DV or more is high. (There is no %DV for trans fat.) Use the Nutrition Facts panel to choose foods low in saturated fat and cholesterol, and if the trans fat is not listed, read the ingredients and limit products that list shortening or hydrogenated or partially hydrogenated vegetable oil, which tend to be high in trans fat. POINTS TO REMEMBER:   Discuss your risk for heart disease with your caregivers, and take steps to reduce risk factors.  Change your diet. Choose foods that are low in saturated fat, trans fat, and cholesterol.  Add exercise to your daily routine if it is not already being done. Participate in physical activity of moderate intensity, like brisk walking, for at least 30 minutes on most, and preferably all days of the week. No time? Break the 30 minutes into three, 10-minute segments during the day.  Stop smoking. If you do smoke, contact your caregiver to discuss ways in which they can help you quit.  Do not use street drugs.  Maintain a normal weight.  Maintain a healthy blood pressure.  Keep  up with your blood work for checking the fats in your blood as directed by your caregiver. Document Released: 09/28/2004 Document Revised: 06/11/2012 Document Reviewed: 04/26/2009 [facility] Patient Information 2014 [place].

## 2014-03-03 MED ORDER — GEMFIBROZIL 600 MG PO TABS: 600.0000 mg | ORAL_TABLET | Freq: Two times a day (BID) | ORAL | Status: DC

## 2014-03-19 ENCOUNTER — Encounter: Payer: Self-pay | Admitting: Physician Assistant

## 2014-03-19 ENCOUNTER — Ambulatory Visit (INDEPENDENT_AMBULATORY_CARE_PROVIDER_SITE_OTHER): Payer: BC Managed Care – PPO | Admitting: Physician Assistant

## 2014-03-19 ENCOUNTER — Telehealth: Payer: Self-pay | Admitting: Physician Assistant

## 2014-03-19 VITALS — BP 132/88 | HR 72 | Temp 98.1°F | Resp 18 | Wt 268.0 lb

## 2014-03-19 DIAGNOSIS — K219 Gastro-esophageal reflux disease without esophagitis: Secondary | ICD-10-CM

## 2014-03-19 DIAGNOSIS — Z7689 Persons encountering health services in other specified circumstances: Secondary | ICD-10-CM

## 2014-03-19 DIAGNOSIS — L989 Disorder of the skin and subcutaneous tissue, unspecified: Secondary | ICD-10-CM

## 2014-03-19 DIAGNOSIS — I10 Essential (primary) hypertension: Secondary | ICD-10-CM

## 2014-03-19 DIAGNOSIS — E785 Hyperlipidemia, unspecified: Secondary | ICD-10-CM

## 2014-03-19 DIAGNOSIS — E291 Testicular hypofunction: Secondary | ICD-10-CM

## 2014-03-19 DIAGNOSIS — Z7189 Other specified counseling: Secondary | ICD-10-CM

## 2014-03-19 NOTE — Assessment & Plan Note (Signed)
Controlled.  Continue current regimen.  Encourage diet and weight loss.

## 2014-03-19 NOTE — Progress Notes (Signed)
Pre-visit discussion using our clinic review tool. No additional management support is needed unless otherwise documented below in the visit note.  

## 2014-03-19 NOTE — Assessment & Plan Note (Signed)
Well controlled. Asymptomatic °Continue current regimen °

## 2014-03-19 NOTE — Assessment & Plan Note (Signed)
Referral to Dermatology Dr. Nevada Crane for further evaluation and excision.  Lesion with some characteristics of melanoma -- Asymmetry, varying color

## 2014-03-19 NOTE — Patient Instructions (Signed)
Please stop the Zetia.  Begin taking the Livalo -- one tablet daily.  We will recheck your cholesterol and triglycerides in 4-6 weeks.  If you are tolerating the medication, I will send in a prescription.  Please call with the name of your wife's dermatologist. I will place the referral so that your toe and back can be evaluated.  Preventive Care for Adults, Male A healthy lifestyle and preventive care can promote health and wellness. Preventive health guidelines for men include the following key practices:  A routine yearly physical is a good way to check with your health care provider about your health and preventative screening. It is a chance to share any concerns and updates on your health and to receive a thorough exam.  Visit your dentist for a routine exam and preventative care every 6 months. Brush your teeth twice a day and floss once a day. Good oral hygiene prevents tooth decay and gum disease.  The frequency of eye exams is based on your age, health, family medical history, use of contact lenses, and other factors. Follow your health care provider's recommendations for frequency of eye exams.  Eat a healthy diet. Foods such as vegetables, fruits, whole grains, low-fat dairy products, and lean protein foods contain the nutrients you need without too many calories. Decrease your intake of foods high in solid fats, added sugars, and salt. Eat the right amount of calories for you.Get information about a proper diet from your health care provider, if necessary.  Regular physical exercise is one of the most important things you can do for your health. Most adults should get at least 150 minutes of moderate-intensity exercise (any activity that increases your heart rate and causes you to sweat) each week. In addition, most adults need muscle-strengthening exercises on 2 or more days a week.  Maintain a healthy weight. The body mass index (BMI) is a screening tool to identify possible weight  problems. It provides an estimate of body fat based on height and weight. Your health care provider can find your BMI and can help you achieve or maintain a healthy weight.For adults 20 years and older:  A BMI below 18.5 is considered underweight.  A BMI of 18.5 to 24.9 is normal.  A BMI of 25 to 29.9 is considered overweight.  A BMI of 30 and above is considered obese.  Maintain normal blood lipids and cholesterol levels by exercising and minimizing your intake of saturated fat. Eat a balanced diet with plenty of fruit and vegetables. Blood tests for lipids and cholesterol should begin at age 73 and be repeated every 5 years. If your lipid or cholesterol levels are high, you are over 50, or you are at high risk for heart disease, you may need your cholesterol levels checked more frequently.Ongoing high lipid and cholesterol levels should be treated with medicines if diet and exercise are not working.  If you smoke, find out from your health care provider how to quit. If you do not use tobacco, do not start.  Lung cancer screening is recommended for adults aged 68 80 years who are at high risk for developing lung cancer because of a history of smoking. A yearly low-dose CT scan of the lungs is recommended for people who have at least a 30-pack-year history of smoking and are a current smoker or have quit within the past 15 years. A pack year of smoking is smoking an average of 1 pack of cigarettes a day for 1 year (for  example: 1 pack a day for 30 years or 2 packs a day for 15 years). Yearly screening should continue until the smoker has stopped smoking for at least 15 years. Yearly screening should be stopped for people who develop a health problem that would prevent them from having lung cancer treatment.  If you choose to drink alcohol, do not have more than 2 drinks per day. One drink is considered to be 12 ounces (355 mL) of beer, 5 ounces (148 mL) of wine, or 1.5 ounces (44 mL) of  liquor.  Avoid use of street drugs. Do not share needles with anyone. Ask for help if you need support or instructions about stopping the use of drugs.  High blood pressure causes heart disease and increases the risk of stroke. Your blood pressure should be checked at least every 1 2 years. Ongoing high blood pressure should be treated with medicines, if weight loss and exercise are not effective.  If you are 19 55 years old, ask your health care provider if you should take aspirin to prevent heart disease.  Diabetes screening involves taking a blood sample to check your fasting blood sugar level. This should be done once every 3 years, after age 66, if you are within normal weight and without risk factors for diabetes. Testing should be considered at a younger age or be carried out more frequently if you are overweight and have at least 1 risk factor for diabetes.  Colorectal cancer can be detected and often prevented. Most routine colorectal cancer screening begins at the age of 49 and continues through age 59. However, your health care provider may recommend screening at an earlier age if you have risk factors for colon cancer. On a yearly basis, your health care provider may provide home test kits to check for hidden blood in the stool. Use of a small camera at the end of a tube to directly examine the colon (sigmoidoscopy or colonoscopy) can detect the earliest forms of colorectal cancer. Talk to your health care provider about this at age 82, when routine screening begins. Direct exam of the colon should be repeated every 5 10 years through age 43, unless early forms of precancerous polyps or small growths are found.  People who are at an increased risk for hepatitis B should be screened for this virus. You are considered at high risk for hepatitis B if:  You were born in a country where hepatitis B occurs often. Talk with your health care provider about which countries are considered  high-risk.  Your parents were born in a high-risk country and you have not received a shot to protect against hepatitis B (hepatitis B vaccine).  You have HIV or AIDS.  You use needles to inject street drugs.  You live with, or have sex with, someone who has hepatitis B.  You are a man who has sex with other men (MSM).  You get hemodialysis treatment.  You take certain medicines for conditions such as cancer, organ transplantation, and autoimmune conditions.  Hepatitis C blood testing is recommended for all people born from [birth year] through 1965 and any individual with known risks for hepatitis C.  Practice safe sex. Use condoms and avoid high-risk sexual practices to reduce the spread of sexually transmitted infections (STIs). STIs include gonorrhea, chlamydia, syphilis, trichomonas, herpes, HPV, and human immunodeficiency virus (HIV). Herpes, HIV, and HPV are viral illnesses that have no cure. They can result in disability, cancer, and death.  A one-time screening for  abdominal aortic aneurysm (AAA) and surgical repair of large AAAs by ultrasound are recommended for men ages 43 to 34 years who are current or former smokers.  Healthy men should no longer receive prostate-specific antigen (PSA) blood tests as part of routine cancer screening. Talk with your health care provider about prostate cancer screening.  Testicular cancer screening is not recommended for adult males who have no symptoms. Screening includes self-exam, a health care provider exam, and other screening tests. Consult with your health care provider about any symptoms you have or any concerns you have about testicular cancer.  Use sunscreen. Apply sunscreen liberally and repeatedly throughout the day. You should seek shade when your shadow is shorter than you. Protect yourself by wearing long sleeves, pants, a wide-brimmed hat, and sunglasses year round, whenever you are outdoors.  Once a month, do a whole-body skin exam,  using a mirror to look at the skin on your back. Tell your health care provider about new moles, moles that have irregular borders, moles that are larger than a pencil eraser, or moles that have changed in shape or color.  Stay current with required vaccines (immunizations).  Influenza vaccine. All adults should be immunized every year.  Tetanus, diphtheria, and acellular pertussis (Td, Tdap) vaccine. An adult who has not previously received Tdap or who does not know his vaccine status should receive 1 dose of Tdap. This initial dose should be followed by tetanus and diphtheria toxoids (Td) booster doses every 10 years. Adults with an unknown or incomplete history of completing a 3-dose immunization series with Td-containing vaccines should begin or complete a primary immunization series including a Tdap dose. Adults should receive a Td booster every 10 years.  Varicella vaccine. An adult without evidence of immunity to varicella should receive 2 doses or a second dose if he has previously received 1 dose.  Human papillomavirus (HPV) vaccine. Males aged 55 21 years who have not received the vaccine previously should receive the 3-dose series. Males aged 47 26 years may be immunized. Immunization is recommended through the age of 23 years for any male who has sex with males and did not get any or all doses earlier. Immunization is recommended for any person with an immunocompromised condition through the age of 33 years if he did not get any or all doses earlier. During the 3-dose series, the second dose should be obtained 4 8 weeks after the first dose. The third dose should be obtained 24 weeks after the first dose and 16 weeks after the second dose.  Zoster vaccine. One dose is recommended for adults aged 38 years or older unless certain conditions are present.  Measles, mumps, and rubella (MMR) vaccine. Adults born before [birth year] generally are considered immune to measles and mumps. Adults born in [birth year]  or later should have 1 or more doses of MMR vaccine unless there is a contraindication to the vaccine or there is laboratory evidence of immunity to each of the three diseases. A routine second dose of MMR vaccine should be obtained at least 28 days after the first dose for students attending postsecondary schools, health care workers, or international travelers. People who received inactivated measles vaccine or an unknown type of measles vaccine during 1963 1967 should receive 2 doses of MMR vaccine. People who received inactivated mumps vaccine or an unknown type of mumps vaccine before 1979 and are at high risk for mumps infection should consider immunization with 2 doses of MMR vaccine. Unvaccinated health care workers  born before [birth year] who lack laboratory evidence of measles, mumps, or rubella immunity or laboratory confirmation of disease should consider measles and mumps immunization with 2 doses of MMR vaccine or rubella immunization with 1 dose of MMR vaccine.  Pneumococcal 13-valent conjugate (PCV13) vaccine. When indicated, a person who is uncertain of his immunization history and has no record of immunization should receive the PCV13 vaccine. An adult aged 2 years or older who has certain medical conditions and has not been previously immunized should receive 1 dose of PCV13 vaccine. This PCV13 should be followed with a dose of pneumococcal polysaccharide (PPSV23) vaccine. The PPSV23 vaccine dose should be obtained at least 8 weeks after the dose of PCV13 vaccine. An adult aged 85 years or older who has certain medical conditions and previously received 1 or more doses of PPSV23 vaccine should receive 1 dose of PCV13. The PCV13 vaccine dose should be obtained 1 or more years after the last PPSV23 vaccine dose.  Pneumococcal polysaccharide (PPSV23) vaccine. When PCV13 is also indicated, PCV13 should be obtained first. All adults aged 85 years and older should be immunized. An adult younger than age  64 years who has certain medical conditions should be immunized. Any person who resides in a nursing home or long-term care facility should be immunized. An adult smoker should be immunized. People with an immunocompromised condition and certain other conditions should receive both PCV13 and PPSV23 vaccines. People with human immunodeficiency virus (HIV) infection should be immunized as soon as possible after diagnosis. Immunization during chemotherapy or radiation therapy should be avoided. Routine use of PPSV23 vaccine is not recommended for American Indians, [place] Natives, or people younger than 65 years unless there are medical conditions that require PPSV23 vaccine. When indicated, people who have unknown immunization and have no record of immunization should receive PPSV23 vaccine. One-time revaccination 5 years after the first dose of PPSV23 is recommended for people aged 67 64 years who have chronic kidney failure, nephrotic syndrome, asplenia, or immunocompromised conditions. People who received 1 2 doses of PPSV23 before age 70 years should receive another dose of PPSV23 vaccine at age 90 years or later if at least 5 years have passed since the previous dose. Doses of PPSV23 are not needed for people immunized with PPSV23 at or after age 58 years.  Meningococcal vaccine. Adults with asplenia or persistent complement component deficiencies should receive 2 doses of quadrivalent meningococcal conjugate (MenACWY-D) vaccine. The doses should be obtained at least 2 months apart. Microbiologists working with certain meningococcal bacteria, [place] recruits, people at risk during an outbreak, and people who travel to or live in countries with a high rate of meningitis should be immunized. A first-year college student up through age 74 years who is living in a residence hall should receive a dose if he did not receive a dose on or after his 16th birthday. Adults who have certain high-risk conditions should  receive one or more doses of vaccine.  Hepatitis A vaccine. Adults who wish to be protected from this disease, have certain high-risk conditions, work with hepatitis A-infected animals, work in hepatitis A research labs, or travel to or work in countries with a high rate of hepatitis A should be immunized. Adults who were previously unvaccinated and who anticipate close contact with an international adoptee during the first 60 days after arrival in the [place] from a country with a high rate of hepatitis A should be immunized.  Hepatitis B vaccine. Adults who wish to be  protected from this disease, have certain high-risk conditions, may be exposed to blood or other infectious body fluids, are household contacts or sex partners of hepatitis B positive people, are clients or workers in certain care facilities, or travel to or work in countries with a high rate of hepatitis B should be immunized.  Haemophilus influenzae type b (Hib) vaccine. A previously unvaccinated person with asplenia or sickle cell disease or having a scheduled splenectomy should receive 1 dose of Hib vaccine. Regardless of previous immunization, a recipient of a hematopoietic stem cell transplant should receive a 3-dose series 6 12 months after his successful transplant. Hib vaccine is not recommended for adults with HIV infection. Preventive Service / Frequency Ages 25 to 7  Blood pressure check.** / Every 1 to 2 years.  Lipid and cholesterol check.** / Every 5 years beginning at age 38.  Hepatitis C blood test.** / For any individual with known risks for hepatitis C.  Skin self-exam. / Monthly.  Influenza vaccine. / Every year.  Tetanus, diphtheria, and acellular pertussis (Tdap, Td) vaccine.** / Consult your health care provider. 1 dose of Td every 10 years.  Varicella vaccine.** / Consult your health care provider.  HPV vaccine. / 3 doses over 6 months, if 89 or younger.  Measles, mumps, rubella (MMR)  vaccine.** / You need at least 1 dose of MMR if you were born in 1957 or later. You may also need a second dose.  Pneumococcal 13-valent conjugate (PCV13) vaccine.** / Consult your health care provider.  Pneumococcal polysaccharide (PPSV23) vaccine.** / 1 to 2 doses if you smoke cigarettes or if you have certain conditions.  Meningococcal vaccine.** / 1 dose if you are age 54 to 53 years and a Market researcher living in a residence hall, or have one of several medical conditions. You may also need additional booster doses.  Hepatitis A vaccine.** / Consult your health care provider.  Hepatitis B vaccine.** / Consult your health care provider.  Haemophilus influenzae type b (Hib) vaccine.** / Consult your health care provider. Ages 70 to 74  Blood pressure check.** / Every 1 to 2 years.  Lipid and cholesterol check.** / Every 5 years beginning at age 46.  Lung cancer screening. / Every year if you are aged 19 80 years and have a 30-pack-year history of smoking and currently smoke or have quit within the past 15 years. Yearly screening is stopped once you have quit smoking for at least 15 years or develop a health problem that would prevent you from having lung cancer treatment.  Fecal occult blood test (FOBT) of stool. / Every year beginning at age 59 and continuing until age 43. You may not have to do this test if you get a colonoscopy every 10 years.  Flexible sigmoidoscopy** or colonoscopy.** / Every 5 years for a flexible sigmoidoscopy or every 10 years for a colonoscopy beginning at age 73 and continuing until age 52.  Hepatitis C blood test.** / For all people born from [birth year] through 1965 and any individual with known risks for hepatitis C.  Skin self-exam. / Monthly.  Influenza vaccine. / Every year.  Tetanus, diphtheria, and acellular pertussis (Tdap/Td) vaccine.** / Consult your health care provider. 1 dose of Td every 10 years.  Varicella vaccine.** / Consult your  health care provider.  Zoster vaccine.** / 1 dose for adults aged 87 years or older.  Measles, mumps, rubella (MMR) vaccine.** / You need at least 1 dose of MMR if you were  born in [birth year] or later. You may also need a second dose.  Pneumococcal 13-valent conjugate (PCV13) vaccine.** / Consult your health care provider.  Pneumococcal polysaccharide (PPSV23) vaccine.** / 1 to 2 doses if you smoke cigarettes or if you have certain conditions.  Meningococcal vaccine.** / Consult your health care provider.  Hepatitis A vaccine.** / Consult your health care provider.  Hepatitis B vaccine.** / Consult your health care provider.  Haemophilus influenzae type b (Hib) vaccine.** / Consult your health care provider. Ages 77 and over  Blood pressure check.** / Every 1 to 2 years.  Lipid and cholesterol check.**/ Every 5 years beginning at age 69.  Lung cancer screening. / Every year if you are aged 64 80 years and have a 30-pack-year history of smoking and currently smoke or have quit within the past 15 years. Yearly screening is stopped once you have quit smoking for at least 15 years or develop a health problem that would prevent you from having lung cancer treatment.  Fecal occult blood test (FOBT) of stool. / Every year beginning at age 31 and continuing until age 39. You may not have to do this test if you get a colonoscopy every 10 years.  Flexible sigmoidoscopy** or colonoscopy.** / Every 5 years for a flexible sigmoidoscopy or every 10 years for a colonoscopy beginning at age 3 and continuing until age 25.  Hepatitis C blood test.** / For all people born from [birth year] through 1967 and any individual with known risks for hepatitis C.  Abdominal aortic aneurysm (AAA) screening.** / A one-time screening for ages 41 to 67 years who are current or former smokers.  Skin self-exam. / Monthly.  Influenza vaccine. / Every year.  Tetanus, diphtheria, and acellular pertussis (Tdap/Td) vaccine.** / 1  dose of Td every 10 years.  Varicella vaccine.** / Consult your health care provider.  Zoster vaccine.** / 1 dose for adults aged 66 years or older.  Pneumococcal 13-valent conjugate (PCV13) vaccine.** / Consult your health care provider.  Pneumococcal polysaccharide (PPSV23) vaccine.** / 1 dose for all adults aged 75 years and older.  Meningococcal vaccine.** / Consult your health care provider.  Hepatitis A vaccine.** / Consult your health care provider.  Hepatitis B vaccine.** / Consult your health care provider.  Haemophilus influenzae type b (Hib) vaccine.** / Consult your health care provider. **Family history and personal history of risk and conditions may change your health care provider's recommendations. Document Released: 02/06/2002 Document Revised: 10/01/2013 Document Reviewed: 05/08/2011 [facility] Patient Information 2014 [place], [place].

## 2014-03-19 NOTE — Assessment & Plan Note (Signed)
Followed by Urology at present.

## 2014-03-19 NOTE — Telephone Encounter (Signed)
Thank you.  I will place the referral.

## 2014-03-19 NOTE — Assessment & Plan Note (Signed)
Will attempt trial of Livalo as it has less incidence of myalgias.  Continue fish oil supplement.  Avoid intake of carbs and refined sugars.  No alcohol.  Will follow-up in 1 month.

## 2014-03-19 NOTE — Progress Notes (Signed)
Patient presents to clinic today to establish care.  Acute Concerns: Lesion of lower back -- complains of skin lesion present for several months.  Denies hx of skin cancer.  Is concerned about lesion.  Lesion is non pruritic, non painful and does not drain.  Chronic Issues: Hypertension -- Currently on Prinzide daily.  BP normotensive in clinic.  Asymptomatic.  Denies chronic cough.  Prediabetes -- on Metformin XR once daily.  Had recent A1C at 6.9.  Never had confirmatory testing. Asymptomatic.  Hyperlipidemia -- Has been on numerous medications in the past.  Cannot tolerate Crestor, Lipitor, Tricor or other fenofibrates.  Last Lipid panel reveals good total cholesterol but Triglycerides elevated to 588.  Patient takes Zetia and fish oil currently.  GERD -- controlled with Prilosec.  Avoids late-night eating and trigger foods.  Hypogonadism -- Followed by urology.    Health Maintenance: Dental -- UTD Vision -- Overdue Immunizations -- Had flu shot in 2014. Is allergic to Tetanus immunization.  Colonoscopy -- 2012; benign polyps; repeat due in 2016  Past Medical History  Diagnosis Date  . Internal bleeding hemorrhoids 2012    "might bleed once/month; only when I strain"  . Hyperlipidemia   . Clostridium difficile infection   . Testicular hypofunction   . Biceps tendon rupture     bilateral  . GERD (gastroesophageal reflux disease)   . Colon polyps   . Allergy   . Arthritis     Past Surgical History  Procedure Laterality Date  . Shoulder arthroscopy w/ rotator cuff repair  07/09/2012    left  . Appendectomy  2011  . Colonoscopy w/ biopsies and polypectomy  2012  . Shoulder arthroscopy  08/22/2012    Procedure: ARTHROSCOPY SHOULDER;  Surgeon: Marin Shutter, MD;  Location: [facility];  Service: Orthopedics;  Laterality: Left;  Left shoulder arthroscopic lavage and debridement  . Shoulder arthroscopy  09/02/2012    Procedure: ARTHROSCOPY SHOULDER;  Surgeon: Marin Shutter, MD;   Location: [place];  Service: Orthopedics;  Laterality: Left;  Left shoulder arthroscopy irrigation and debridement  . Fissurectomy    . Excisional hemorrhoidectomy    . Internal sphincterotomy    . Hemorrhoid surgery  06/24/2013  . Anal fissure repair  06/24/2013    Current Outpatient Prescriptions on File Prior to Visit  Medication Sig Dispense Refill  . ezetimibe (ZETIA) 10 MG tablet Take 1 tablet (10 mg total) by mouth daily.  30 tablet  5  . lisinopril-hydrochlorothiazide (PRINZIDE,ZESTORETIC) 20-12.5 MG per tablet Take 1 tablet by mouth 2 (two) times daily.  180 tablet  1  . metFORMIN (GLUCOPHAGE-XR) 500 MG 24 hr tablet Take 1 tablet (500 mg total) by mouth at bedtime.  60 tablet  5  . omega-3 acid ethyl esters (LOVAZA) 1 G capsule Take 2 capsules (2 g total) by mouth 2 (two) times daily.  120 capsule  5  . omeprazole (PRILOSEC) 20 MG capsule Take 1 capsule (20 mg total) by mouth daily.  90 capsule  3  . clomiPHENE (CLOMID) 50 MG tablet Take 1 tablet (50 mg total) by mouth daily.  30 tablet  3  . gemfibrozil (LOPID) 600 MG tablet Take 1 tablet (600 mg total) by mouth 2 (two) times daily before a meal.  60 tablet  3  . HYDROcodone-acetaminophen (NORCO/VICODIN) 5-325 MG per tablet        No current facility-administered medications on file prior to visit.    Allergies  Allergen Reactions  . Asa [Aspirin]  Nausea Only and Rash  . Penicillins Rash  . Pork-Derived Products Diarrhea and Nausea And Vomiting  . Tetanus Toxoids Anaphylaxis  . Beef-Derived Products Nausea And Vomiting    "don't digest beef very well"  . Daptomycin Other (See Comments)    myalgias    Family History  Problem Relation Age of Onset  . Heart disease Mother   . Hypertension Mother   . Hyperlipidemia Mother   . Heart disease Father   . CVA Father   . Hypertension Maternal Grandmother   . Hyperlipidemia Maternal Grandmother   . Cancer Maternal Grandmother     Colon Cancer  . Heart disease Maternal  Grandfather   . Hypertension Maternal Grandfather   . Hyperlipidemia Maternal Grandfather   . Cancer Maternal Grandfather     Lung Cancer  . Asthma Paternal Grandmother     History   Social History  . Marital Status: Married    Spouse Name: N/A    Number of Children: N/A  . Years of Education: N/A   Occupational History  . Not on file.   Social History Main Topics  . Smoking status: Never Smoker   . Smokeless tobacco: Never Used  . Alcohol Use: Yes     Comment: 08/21/2012 "might drink a beer once a year"  . Drug Use: No  . Sexual Activity: Not Currently   Other Topics Concern  . Not on file   Social History Narrative   Marital Status: Married Pamala Hurry)    Children:  Step Daughter Sharyn Lull)    Pets: None    Living Situation: Lives with Pamala Hurry    Occupation: [employer] [place]    Education: Associate's Degree in [employer]    Tobacco Use/Exposure:  None    Alcohol Use:  Rarely    Drug Use:  None   Diet:  Regular   Exercise: Limited     Hobbies:  Hunting, Fishing             ROS  BP 132/88  Pulse 72  Temp(Src) 98.1 F (36.7 C) (Oral)  Resp 18  Wt 268 lb (121.564 kg)  SpO2 97%  Physical Exam  Recent Results (from the past 2160 hour(s))  CBC WITH DIFFERENTIAL     Status: Abnormal   Collection Time    02/23/14  9:23 AM      Result Value Ref Range   WBC 8.1  4.0 - 10.5 K/uL   RBC 4.95  4.22 - 5.81 MIL/uL   Hemoglobin 16.3  13.0 - 17.0 g/dL   HCT 46.5  39.0 - 52.0 %   MCV 93.9  78.0 - 100.0 fL   MCH 32.9  26.0 - 34.0 pg   MCHC 35.1  30.0 - 36.0 g/dL   RDW 14.4  11.5 - 15.5 %   Platelets 119 (*) 150 - 400 K/uL   Neutrophils Relative % 60  43 - 77 %   Neutro Abs 4.9  1.7 - 7.7 K/uL   Lymphocytes Relative 27  12 - 46 %   Lymphs Abs 2.2  0.7 - 4.0 K/uL   Monocytes Relative 8  3 - 12 %   Monocytes Absolute 0.6  0.1 - 1.0 K/uL   Eosinophils Relative 4  0 - 5 %   Eosinophils Absolute 0.3  0.0 - 0.7 K/uL   Basophils Relative 1  0 - 1 %    Basophils Absolute 0.1  0.0 - 0.1 K/uL   Smear Review Criteria for  review not met    COMPLETE METABOLIC PANEL WITH GFR     Status: Abnormal   Collection Time    02/23/14  9:23 AM      Result Value Ref Range   Sodium 136  135 - 145 mEq/L   Potassium 4.1  3.5 - 5.3 mEq/L   Chloride 99  96 - 112 mEq/L   CO2 30  19 - 32 mEq/L   Glucose, Bld 112 (*) 70 - 99 mg/dL   BUN 13  6 - 23 mg/dL   Creat 0.97  0.50 - 1.35 mg/dL   Total Bilirubin 1.0  0.2 - 1.2 mg/dL   Alkaline Phosphatase 39  39 - 117 U/L   AST 27  0 - 37 U/L   ALT 25  0 - 53 U/L   Total Protein 6.2  6.0 - 8.3 g/dL   Albumin 3.8  3.5 - 5.2 g/dL   Calcium 9.0  8.4 - 10.5 mg/dL   GFR, Est African American >89     GFR, Est Non African American 88     Comment:       The estimated GFR is a calculation valid for adults (>=58 years old)     that uses the CKD-EPI algorithm to adjust for age and sex. It is       not to be used for children, pregnant women, hospitalized patients,        patients on dialysis, or with rapidly changing kidney function.     According to the NKDEP, eGFR >89 is normal, 60-89 shows mild     impairment, 30-59 shows moderate impairment, 15-29 shows severe     impairment and <15 is ESRD.        LIPID PANEL     Status: Abnormal   Collection Time    02/23/14  9:23 AM      Result Value Ref Range   Cholesterol 155  0 - 200 mg/dL   Comment: ATP III Classification:           < 200        mg/dL        Desirable          200 - 239     mg/dL        Borderline High          >= 240        mg/dL        High         Triglycerides 588 (*) <150 mg/dL   HDL 28 (*) >39 mg/dL   Total CHOL/HDL Ratio 5.5     VLDL NOT CALC  0 - 40 mg/dL   Comment:       Not calculated due to Triglyceride >400.     Suggest ordering Direct LDL (Unit Code: [phone]).   LDL Cholesterol NOT CALC  0 - 99 mg/dL   Comment:       Not calculated due to Triglyceride >400.     Suggest ordering Direct LDL (Unit Code: [phone]).           Total  Cholesterol/HDL Ratio:CHD Risk                            Coronary Heart Disease Risk Table  Men       Women              1/2 Average Risk              3.4        3.3                  Average Risk              5.0        4.4               2X Average Risk              9.6        7.1               3X Average Risk             23.4       11.0     Use the calculated Patient Ratio above and the CHD Risk table      to determine the patient's CHD Risk.     ATP III Classification (LDL):           < 100        mg/dL         Optimal          100 - 129     mg/dL         Near or Above Optimal          130 - 159     mg/dL         Borderline High          160 - 189     mg/dL         High           > 190        mg/dL         Very High        POCT GLYCOSYLATED HEMOGLOBIN (HGB A1C)     Status: Abnormal   Collection Time    03/02/14  9:38 AM      Result Value Ref Range   Hemoglobin A1C 6.9      Assessment/Plan: Essential hypertension, benign Well-controlled.  Asymptomatic. Continue current regimen.  GERD (gastroesophageal reflux disease) Controlled.  Continue current regimen.  Encourage diet and weight loss.  Testicular hypofunction Followed by Urology at present.  Hyperlipidemia Will attempt trial of Livalo as it has less incidence of myalgias.  Continue fish oil supplement.  Avoid intake of carbs and refined sugars.  No alcohol.  Will follow-up in 1 month.  Encounter to establish care with new doctor Medical history reviewed and updated. Will obtain labs.   Skin lesion of back Referral to Dermatology Dr. Nevada Crane for further evaluation and excision.  Lesion with some characteristics of melanoma -- Asymmetry, varying color

## 2014-03-19 NOTE — Telephone Encounter (Signed)
PATIENT CALLED TO TELL YOU THE DERMATOLOGIST HIS WIFE SEES IS DR Troy

## 2014-03-19 NOTE — Assessment & Plan Note (Signed)
Medical history reviewed and updated. Will obtain labs.

## 2014-03-19 NOTE — Telephone Encounter (Signed)
Relevant patient education assigned to patient using Emmi. ° °

## 2014-04-15 ENCOUNTER — Telehealth: Payer: Self-pay | Admitting: *Deleted

## 2014-04-15 DIAGNOSIS — E785 Hyperlipidemia, unspecified: Secondary | ICD-10-CM

## 2014-04-15 LAB — LIPID PANEL
CHOLESTEROL: 138 mg/dL (ref 0–200)
HDL: 28 mg/dL — ABNORMAL LOW (ref >39)
LDL Cholesterol: 56 mg/dL (ref 0–99)
TRIGLYCERIDES: 271 mg/dL — AB (ref <150)
Total CHOL/HDL Ratio: 4.9 Ratio
VLDL: 54 mg/dL — ABNORMAL HIGH (ref 0–40)

## 2014-04-15 NOTE — Telephone Encounter (Signed)
Pt presented to the lab. Lipid panel ordered per 03/19/14 AVS with Hassell Done, [place].

## 2014-04-22 ENCOUNTER — Ambulatory Visit (INDEPENDENT_AMBULATORY_CARE_PROVIDER_SITE_OTHER): Payer: BC Managed Care – PPO | Admitting: Physician Assistant

## 2014-04-22 ENCOUNTER — Telehealth: Payer: Self-pay | Admitting: Physician Assistant

## 2014-04-22 ENCOUNTER — Encounter: Payer: Self-pay | Admitting: Physician Assistant

## 2014-04-22 VITALS — BP 130/88 | HR 74 | Temp 98.4°F | Resp 16 | Ht 68.0 in | Wt 270.5 lb

## 2014-04-22 DIAGNOSIS — E785 Hyperlipidemia, unspecified: Secondary | ICD-10-CM

## 2014-04-22 MED ORDER — OMEGA-3-ACID ETHYL ESTERS 1 G PO CAPS: 2.0000 g | ORAL_CAPSULE | Freq: Two times a day (BID) | ORAL | Status: DC

## 2014-04-22 MED ORDER — PITAVASTATIN CALCIUM 4 MG PO TABS: 1.0000 | ORAL_TABLET | Freq: Every day | ORAL | Status: DC

## 2014-04-22 NOTE — Progress Notes (Signed)
Patient presents to clinic today for follow-up of hypertension and hyperlipidemia.  Patient started on Livalo after last visit due to TGL in upper 500s and hx of hyperlipidemia.  Repeat labs show TGL have started to come down to the 200 range.  LDL is < 100.  Patient endorses he is feeling great.  Patient denies myalgias or arthralgias.  Past Medical History  Diagnosis Date  . Internal bleeding hemorrhoids 2012    "might bleed once/month; only when I strain"  . Hyperlipidemia   . Clostridium difficile infection   . Testicular hypofunction   . Biceps tendon rupture     bilateral  . GERD (gastroesophageal reflux disease)   . Colon polyps   . Allergy   . Arthritis   . Hypertension   . Diabetes     Borderline/Pre-diabetes  . Leg cramps     Current Outpatient Prescriptions on File Prior to Visit  Medication Sig Dispense Refill  . calcium-vitamin D (OSCAL WITH D) 500-200 MG-UNIT per tablet Take 1 tablet by mouth.      . cyclobenzaprine (FLEXERIL) 10 MG tablet Take 10 mg by mouth 3 (three) times daily as needed for muscle spasms.      Marland Kitchen docusate sodium (COLACE) 100 MG capsule Take 100 mg by mouth daily.       Marland Kitchen HYDROcodone-acetaminophen (NORCO/VICODIN) 5-325 MG per tablet       . metFORMIN (GLUCOPHAGE-XR) 500 MG 24 hr tablet Take 1 tablet (500 mg total) by mouth at bedtime.  60 tablet  5  . omeprazole (PRILOSEC) 20 MG capsule Take 1 capsule (20 mg total) by mouth daily.  90 capsule  3   No current facility-administered medications on file prior to visit.    Allergies  Allergen Reactions  . Asa [Aspirin] Nausea Only and Rash  . Penicillins Rash  . Pork-Derived Products Diarrhea and Nausea And Vomiting    PER PATIENT REQUEST DELETE, ALLERGIST RESOLVED  . Tetanus Toxoids Anaphylaxis  . Beef-Derived Products Nausea And Vomiting    PER PATIENT REQUEST, DELETE, ALLERGIST RESOLVED  . Daptomycin Other (See Comments)    myalgias  . Prednisone (Pak) [Prednisone] Other (See Comments)   Hyperactivity    Family History  Problem Relation Age of Onset  . Heart disease Mother 64    Deceased  . Hypertension Mother   . Hyperlipidemia Mother   . Heart disease Father 70    Deceased  . CVA Father   . Hypertension Maternal Grandmother   . Hyperlipidemia Maternal Grandmother   . Cancer Maternal Grandmother     Colon Cancer  . Heart disease Maternal Grandfather   . Hypertension Maternal Grandfather   . Hyperlipidemia Maternal Grandfather   . Cancer Maternal Grandfather     Lung Cancer  . Asthma Paternal Grandmother   . Heart disease Paternal Grandfather     History   Social History  . Marital Status: Married    Spouse Name: N/A    Number of Children: N/A  . Years of Education: N/A   Social History Main Topics  . Smoking status: Never Smoker   . Smokeless tobacco: Never Used  . Alcohol Use: Yes     Comment: 08/21/2012 "might drink a beer once a year"  . Drug Use: No  . Sexual Activity: Not Currently   Other Topics Concern  . None   Social History Narrative   Marital Status: Married Pamala Hurry)    Children:  Step Daughter Sharyn Lull)    Pets: None  Living Situation: Lives with Pamala Hurry    Occupation: [place] ([place]    Education: Associate's Degree in [employer]    Tobacco Use/Exposure:  None    Alcohol Use:  Rarely    Drug Use:  None   Diet:  Regular   Exercise: Limited     Hobbies:  Hunting, Fishing             Review of Systems - See HPI.  All other ROS are negative.  BP 130/88  Pulse 74  Temp(Src) 98.4 F (36.9 C) (Oral)  Resp 16  Ht _0  (1.727 m)  Wt 270 lb 8 oz (122.698 kg)  BMI 41.14 kg/m2  SpO2 97%  Physical Exam  Vitals reviewed. Constitutional: He is oriented to person, place, and time and well-developed, well-nourished, and in no distress.  HENT:  Head: Normocephalic and atraumatic.  Eyes: Conjunctivae and EOM are normal. Pupils are equal, round, and reactive to light.  Neck: Neck supple.  Cardiovascular:  Normal rate, regular rhythm, normal heart sounds and intact distal pulses.   Pulmonary/Chest: Effort normal and breath sounds normal. No respiratory distress. He has no wheezes. He has no rales. He exhibits no tenderness.  Neurological: He is alert and oriented to person, place, and time.  Skin: Skin is warm and dry. No rash noted.  Psychiatric: Affect normal.    Recent Results (from the past 2160 hour(s))  CBC WITH DIFFERENTIAL     Status: Abnormal   Collection Time    02/23/14  9:23 AM      Result Value Ref Range   WBC 8.1  4.0 - 10.5 K/uL   RBC 4.95  4.22 - 5.81 MIL/uL   Hemoglobin 16.3  13.0 - 17.0 g/dL   HCT 46.5  39.0 - 52.0 %   MCV 93.9  78.0 - 100.0 fL   MCH 32.9  26.0 - 34.0 pg   MCHC 35.1  30.0 - 36.0 g/dL   RDW 14.4  11.5 - 15.5 %   Platelets 119 (*) 150 - 400 K/uL   Neutrophils Relative % 60  43 - 77 %   Neutro Abs 4.9  1.7 - 7.7 K/uL   Lymphocytes Relative 27  12 - 46 %   Lymphs Abs 2.2  0.7 - 4.0 K/uL   Monocytes Relative 8  3 - 12 %   Monocytes Absolute 0.6  0.1 - 1.0 K/uL   Eosinophils Relative 4  0 - 5 %   Eosinophils Absolute 0.3  0.0 - 0.7 K/uL   Basophils Relative 1  0 - 1 %   Basophils Absolute 0.1  0.0 - 0.1 K/uL   Smear Review Criteria for review not met    COMPLETE METABOLIC PANEL WITH GFR     Status: Abnormal   Collection Time    02/23/14  9:23 AM      Result Value Ref Range   Sodium 136  135 - 145 mEq/L   Potassium 4.1  3.5 - 5.3 mEq/L   Chloride 99  96 - 112 mEq/L   CO2 30  19 - 32 mEq/L   Glucose, Bld 112 (*) 70 - 99 mg/dL   BUN 13  6 - 23 mg/dL   Creat 0.97  0.50 - 1.35 mg/dL   Total Bilirubin 1.0  0.2 - 1.2 mg/dL   Alkaline Phosphatase 39  39 - 117 U/L   AST 27  0 - 37 U/L   ALT 25  0 - 53  U/L   Total Protein 6.2  6.0 - 8.3 g/dL   Albumin 3.8  3.5 - 5.2 g/dL   Calcium 9.0  8.4 - 10.5 mg/dL   GFR, Est African American >89     GFR, Est Non African American 88     Comment:       The estimated GFR is a calculation valid for adults (>=81  years old)     that uses the CKD-EPI algorithm to adjust for age and sex. It is       not to be used for children, pregnant women, hospitalized patients,        patients on dialysis, or with rapidly changing kidney function.     According to the NKDEP, eGFR >89 is normal, 60-89 shows mild     impairment, 30-59 shows moderate impairment, 15-29 shows severe     impairment and <15 is ESRD.        LIPID PANEL     Status: Abnormal   Collection Time    02/23/14  9:23 AM      Result Value Ref Range   Cholesterol 155  0 - 200 mg/dL   Comment: ATP III Classification:           < 200        mg/dL        Desirable          200 - 239     mg/dL        Borderline High          >= 240        mg/dL        High         Triglycerides 588 (*) <150 mg/dL   HDL 28 (*) >39 mg/dL   Total CHOL/HDL Ratio 5.5     VLDL NOT CALC  0 - 40 mg/dL   Comment:       Not calculated due to Triglyceride >400.     Suggest ordering Direct LDL (Unit Code: [phone]).   LDL Cholesterol NOT CALC  0 - 99 mg/dL   Comment:       Not calculated due to Triglyceride >400.     Suggest ordering Direct LDL (Unit Code: [phone]).           Total Cholesterol/HDL Ratio:CHD Risk                            Coronary Heart Disease Risk Table                                            Men       Women              1/2 Average Risk              3.4        3.3                  Average Risk              5.0        4.4               2X Average Risk              9.6  7.1               3X Average Risk             23.4       11.0     Use the calculated Patient Ratio above and the CHD Risk table      to determine the patient's CHD Risk.     ATP III Classification (LDL):           < 100        mg/dL         Optimal          100 - 129     mg/dL         Near or Above Optimal          130 - 159     mg/dL         Borderline High          160 - 189     mg/dL         High           > 190        mg/dL         Very High        POCT GLYCOSYLATED  HEMOGLOBIN (HGB A1C)     Status: Abnormal   Collection Time    03/02/14  9:38 AM      Result Value Ref Range   Hemoglobin A1C 6.9    LIPID PANEL     Status: Abnormal   Collection Time    04/15/14 10:12 AM      Result Value Ref Range   Cholesterol 138  0 - 200 mg/dL   Comment: ATP III Classification:           < 200        mg/dL        Desirable          200 - 239     mg/dL        Borderline High          >= 240        mg/dL        High         Triglycerides 271 (*) <150 mg/dL   HDL 28 (*) >39 mg/dL   Total CHOL/HDL Ratio 4.9     VLDL 54 (*) 0 - 40 mg/dL   LDL Cholesterol 56  0 - 99 mg/dL   Comment:       Total Cholesterol/HDL Ratio:CHD Risk                            Coronary Heart Disease Risk Table                                            Men       Women              1/2 Average Risk              3.4        3.3                  Average Risk              5.0  4.4               2X Average Risk              9.6        7.1               3X Average Risk             23.4       11.0     Use the calculated Patient Ratio above and the CHD Risk table      to determine the patient's CHD Risk.     ATP III Classification (LDL):           < 100        mg/dL         Optimal          100 - 129     mg/dL         Near or Above Optimal          130 - 159     mg/dL         Borderline High          160 - 189     mg/dL         High           > 190        mg/dL         Very High         Assessment/Plan: Hyperlipidemia Increase Livalo to 4 mg daily to get better control of TGL.  Limit intake of refined sugars.  Continue exercising.  Follow-up 3 months.

## 2014-04-22 NOTE — Progress Notes (Signed)
Pre visit review using our clinic review tool, if applicable. No additional management support is needed unless otherwise documented below in the visit note/SLS  

## 2014-04-22 NOTE — Telephone Encounter (Signed)
Refill- livalo

## 2014-04-22 NOTE — Assessment & Plan Note (Signed)
Increase Livalo to 4 mg daily to get better control of TGL.  Limit intake of refined sugars.  Continue exercising.  Follow-up 3 months.

## 2014-04-22 NOTE — Patient Instructions (Signed)
Please continue Livalo once daily.  We are increasing to a 4 mg tablet.  Will recheck labs in 1 month -- A1C and liver function.  Please continue other medications as directed.  I am glad you are doing well.  Follow-up in 3 months.  Hypertriglyceridemia  Diet for High blood levels of Triglycerides Most fats in food are triglycerides. Triglycerides in your blood are stored as fat in your body. High levels of triglycerides in your blood may put you at a greater risk for heart disease and stroke.  Normal triglyceride levels are less than 150 mg/dL. Borderline high levels are 150-199 mg/dl. High levels are 200 - 499 mg/dL, and very high triglyceride levels are greater than 500 mg/dL. The decision to treat high triglycerides is generally based on the level. For people with borderline or high triglyceride levels, treatment includes weight loss and exercise. Drugs are recommended for people with very high triglyceride levels. Many people who need treatment for high triglyceride levels have metabolic syndrome. This syndrome is a collection of disorders that often include: insulin resistance, high blood pressure, blood clotting problems, high cholesterol and triglycerides. TESTING PROCEDURE FOR TRIGLYCERIDES  You should not eat 4 hours before getting your triglycerides measured. The normal range of triglycerides is between 10 and 250 milligrams per deciliter (mg/dl). Some people may have extreme levels (1000 or above), but your triglyceride level may be too high if it is above 150 mg/dl, depending on what other risk factors you have for heart disease.  People with high blood triglycerides may also have high blood cholesterol levels. If you have high blood cholesterol as well as high blood triglycerides, your risk for heart disease is probably greater than if you only had high triglycerides. High blood cholesterol is one of the main risk factors for heart disease. CHANGING YOUR DIET  Your weight can affect your  blood triglyceride level. If you are more than 20% above your ideal body weight, you may be able to lower your blood triglycerides by losing weight. Eating less and exercising regularly is the best way to combat this. Fat provides more calories than any other food. The best way to lose weight is to eat less fat. Only 30% of your total calories should come from fat. Less than 7% of your diet should come from saturated fat. A diet low in fat and saturated fat is the same as a diet to decrease blood cholesterol. By eating a diet lower in fat, you may lose weight, lower your blood cholesterol, and lower your blood triglyceride level.  Eating a diet low in fat, especially saturated fat, may also help you lower your blood triglyceride level. Ask your dietitian to help you figure how much fat you can eat based on the number of calories your caregiver has prescribed for you.  Exercise, in addition to helping with weight loss may also help lower triglyceride levels.   Alcohol can increase blood triglycerides. You may need to stop drinking alcoholic beverages.  Too much carbohydrate in your diet may also increase your blood triglycerides. Some complex carbohydrates are necessary in your diet. These may include bread, rice, potatoes, other starchy vegetables and cereals.  Reduce "simple" carbohydrates. These may include pure sugars, candy, honey, and jelly without losing other nutrients. If you have the kind of high blood triglycerides that is affected by the amount of carbohydrates in your diet, you will need to eat less sugar and less high-sugar foods. Your caregiver can help you with this.  Adding 2-4 grams of fish oil (EPA+ DHA) may also help lower triglycerides. Speak with your caregiver before adding any supplements to your regimen. Following the Diet  Maintain your ideal weight. Your caregivers can help you with a diet. Generally, eating less food and getting more exercise will help you lose weight. Joining  a weight control group may also help. Ask your caregivers for a good weight control group in your area.  Eat low-fat foods instead of high-fat foods. This can help you lose weight too.  These foods are lower in fat. Eat MORE of these:   Dried beans, peas, and lentils.  Egg whites.  Low-fat cottage cheese.  Fish.  Lean cuts of meat, such as round, sirloin, rump, and flank (cut extra fat off meat you fix).  Whole grain breads, cereals and pasta.  Skim and nonfat dry milk.  Low-fat yogurt.  Poultry without the skin.  Cheese made with skim or part-skim milk, such as mozzarella, parmesan, farmers', ricotta, or pot cheese. These are higher fat foods. Eat LESS of these:   Whole milk and foods made from whole milk, such as American, blue, cheddar, monterey jack, and swiss cheese  High-fat meats, such as luncheon meats, sausages, knockwurst, bratwurst, hot dogs, ribs, corned beef, ground pork, and regular ground beef.  Fried foods. Limit saturated fats in your diet. Substituting unsaturated fat for saturated fat may decrease your blood triglyceride level. You will need to read package labels to know which products contain saturated fats.  These foods are high in saturated fat. Eat LESS of these:   Fried pork skins.  Whole milk.  Skin and fat from poultry.  Palm oil.  Butter.  Shortening.  Cream cheese.  Berniece Salines.  Margarines and baked goods made from listed oils.  Vegetable shortenings.  Chitterlings.  Fat from meats.  Coconut oil.  Palm kernel oil.  Lard.  Cream.  Sour cream.  Fatback.  Coffee whiteners and non-dairy creamers made with these oils.  Cheese made from whole milk. Use unsaturated fats (both polyunsaturated and monounsaturated) moderately. Remember, even though unsaturated fats are better than saturated fats; you still want a diet low in total fat.  These foods are high in unsaturated fat:   Canola oil.  Sunflower  oil.  Mayonnaise.  Almonds.  Peanuts.  Pine nuts.  Margarines made with these oils.  Safflower oil.  Olive oil.  Avocados.  Cashews.  Peanut butter.  Sunflower seeds.  Soybean oil.  Peanut oil.  Olives.  Pecans.  Walnuts.  Pumpkin seeds. Avoid sugar and other high-sugar foods. This will decrease carbohydrates without decreasing other nutrients. Sugar in your food goes rapidly to your blood. When there is excess sugar in your blood, your liver may use it to make more triglycerides. Sugar also contains calories without other important nutrients.  Eat LESS of these:   Sugar, brown sugar, powdered sugar, jam, jelly, preserves, honey, syrup, molasses, pies, candy, cakes, cookies, frosting, pastries, colas, soft drinks, punches, fruit drinks, and regular gelatin.  Avoid alcohol. Alcohol, even more than sugar, may increase blood triglycerides. In addition, alcohol is high in calories and low in nutrients. Ask for sparkling water, or a diet soft drink instead of an alcoholic beverage. Suggestions for planning and preparing meals   Bake, broil, grill or roast meats instead of frying.  Remove fat from meats and skin from poultry before cooking.  Add spices, herbs, lemon juice or vinegar to vegetables instead of salt, rich sauces or gravies.  Use  a non-stick skillet without fat or use no-stick sprays.  Cool and refrigerate stews and broth. Then remove the hardened fat floating on the surface before serving.  Refrigerate meat drippings and skim off fat to make low-fat gravies.  Serve more fish.  Use less butter, margarine and other high-fat spreads on bread or vegetables.  Use skim or reconstituted non-fat dry milk for cooking.  Cook with low-fat cheeses.  Substitute low-fat yogurt or cottage cheese for all or part of the sour cream in recipes for sauces, dips or congealed salads.  Use half yogurt/half mayonnaise in salad recipes.  Substitute evaporated skim  milk for cream. Evaporated skim milk or reconstituted non-fat dry milk can be whipped and substituted for whipped cream in certain recipes.  Choose fresh fruits for dessert instead of high-fat foods such as pies or cakes. Fruits are naturally low in fat. When Dining Out   Order low-fat appetizers such as fruit or vegetable juice, pasta with vegetables or tomato sauce.  Select clear, rather than cream soups.  Ask that dressings and gravies be served on the side. Then use less of them.  Order foods that are baked, broiled, poached, steamed, stir-fried, or roasted.  Ask for margarine instead of butter, and use only a small amount.  Drink sparkling water, unsweetened tea or coffee, or diet soft drinks instead of alcohol or other sweet beverages. QUESTIONS AND ANSWERS ABOUT OTHER FATS IN THE BLOOD: SATURATED FAT, TRANS FAT, AND CHOLESTEROL What is trans fat? Trans fat is a type of fat that is formed when vegetable oil is hardened through a process called hydrogenation. This process helps makes foods more solid, gives them shape, and prolongs their shelf life. Trans fats are also called hydrogenated or partially hydrogenated oils.  What do saturated fat, trans fat, and cholesterol in foods have to do with heart disease? Saturated fat, trans fat, and cholesterol in the diet all raise the level of LDL "bad" cholesterol in the blood. The higher the LDL cholesterol, the greater the risk for coronary heart disease (CHD). Saturated fat and trans fat raise LDL similarly.  What foods contain saturated fat, trans fat, and cholesterol? High amounts of saturated fat are found in animal products, such as fatty cuts of meat, chicken skin, and full-fat dairy products like butter, whole milk, cream, and cheese, and in tropical vegetable oils such as palm, palm kernel, and coconut oil. Trans fat is found in some of the same foods as saturated fat, such as vegetable shortening, some margarines (especially hard or  stick margarine), crackers, cookies, baked goods, fried foods, salad dressings, and other processed foods made with partially hydrogenated vegetable oils. Small amounts of trans fat also occur naturally in some animal products, such as milk products, beef, and lamb. Foods high in cholesterol include liver, other organ meats, egg yolks, shrimp, and full-fat dairy products. How can I use the new food label to make heart-healthy food choices? Check the Nutrition Facts panel of the food label. Choose foods lower in saturated fat, trans fat, and cholesterol. For saturated fat and cholesterol, you can also use the Percent Daily Value (%DV): 5% DV or less is low, and 20% DV or more is high. (There is no %DV for trans fat.) Use the Nutrition Facts panel to choose foods low in saturated fat and cholesterol, and if the trans fat is not listed, read the ingredients and limit products that list shortening or hydrogenated or partially hydrogenated vegetable oil, which tend to be high in  trans fat. POINTS TO REMEMBER:   Discuss your risk for heart disease with your caregivers, and take steps to reduce risk factors.  Change your diet. Choose foods that are low in saturated fat, trans fat, and cholesterol.  Add exercise to your daily routine if it is not already being done. Participate in physical activity of moderate intensity, like brisk walking, for at least 30 minutes on most, and preferably all days of the week. No time? Break the 30 minutes into three, 10-minute segments during the day.  Stop smoking. If you do smoke, contact your caregiver to discuss ways in which they can help you quit.  Do not use street drugs.  Maintain a normal weight.  Maintain a healthy blood pressure.  Keep up with your blood work for checking the fats in your blood as directed by your caregiver. Document Released: 09/28/2004 Document Revised: 06/11/2012 Document Reviewed: 04/26/2009 [facility] Patient Information 2014 [employer].

## 2014-04-23 MED ORDER — PITAVASTATIN CALCIUM 4 MG PO TABS: 1.0000 | ORAL_TABLET | Freq: Every day | ORAL | Status: DC

## 2014-04-23 NOTE — Telephone Encounter (Signed)
Rx request to pharmacy/SLS  

## 2014-05-09 DIAGNOSIS — E119 Type 2 diabetes mellitus without complications: Secondary | ICD-10-CM | POA: Insufficient documentation

## 2014-05-09 DIAGNOSIS — E781 Pure hyperglyceridemia: Secondary | ICD-10-CM | POA: Insufficient documentation

## 2014-05-09 DIAGNOSIS — E785 Hyperlipidemia, unspecified: Secondary | ICD-10-CM | POA: Insufficient documentation

## 2014-05-11 ENCOUNTER — Telehealth: Payer: Self-pay | Admitting: Physician Assistant

## 2014-05-11 NOTE — Telephone Encounter (Signed)
Received paperwork for PA on Livalo, forward paperwork to nurse

## 2014-05-12 ENCOUNTER — Telehealth: Payer: Self-pay

## 2014-05-12 NOTE — Telephone Encounter (Signed)
Has this been done? Pt left a message on [facility] phone at 8 am on 05-11-14

## 2014-05-12 NOTE — Telephone Encounter (Signed)
Dr Charlett Blake is there anything that should be called in for the patient for the BP? Pt left a message on Monday [facility] phone) stating that he was out of his meds and hasn't heard back about the pa for his BP med? I don't see that one has been done?

## 2014-05-12 NOTE — Telephone Encounter (Signed)
Patient left a message stating he was wandering about the [place] PA? Pt did state he was out of meds as of Monday?  Please advise if this has been done.  It stated in previous message that the pa form has been forwarded to nurse

## 2014-05-12 NOTE — Telephone Encounter (Signed)
Disregard this. I believe the patient is talking about Livalo

## 2014-05-13 ENCOUNTER — Other Ambulatory Visit: Payer: Self-pay | Admitting: Physician Assistant

## 2014-05-13 NOTE — Telephone Encounter (Signed)
I received PA this morning. I have completed it and sent it to be faxed.  Tell him I am sorry but I was not in the office due to being on vacation and we are short-staffed so we do not have an RN to fill out these forms.

## 2014-05-13 NOTE — Telephone Encounter (Signed)
I would not be aware of this and/or have completed this paperwork, as I was out of the office on 05.18.15 [PAL] & 05.19.15 [Normal Day Off] and nothing was in our box this morning, and this was the timeframe of the telephone /office note RE: this matter; will forward to PCP for verification if he has received this paperwork/SLS  Call Documentation     Varney Daily, Warren at 05/12/2014 1:02 PM     Status: Signed        Disregard this. I believe the patient is talking about Dean, RMA at 05/12/2014 12:58 PM     Status: Signed        Dr Charlett Blake is there anything that should be called in for the patient for the BP? Pt left a message on Monday [facility] phone) stating that he was out of his meds and hasn't heard back about the pa for his BP med? I don't see that one has been done?        Varney Daily, RMA at 05/12/2014 12:55 PM     Status: Signed        Has this been done? Pt left a message on [facility] phone at 8 am on 05-11-14        Synthia Innocent at 05/11/2014 6:26 PM     Status: Signed        Received paperwork for PA on Livalo, forward paperwork to nurse

## 2014-05-15 NOTE — Telephone Encounter (Signed)
PA Department called stating that patient's Diagnosis was left blank on PA paperwork; need for completion, reference [id number]. Attempted to return call, no answer/no answering machine at extension given; choose option to speak with customer service representative who could not locate member in system by name, DOB and/or ID number. Will try & locate paperwork to refax/SLS

## 2014-05-19 NOTE — Telephone Encounter (Signed)
Did this get resent?

## 2014-05-20 ENCOUNTER — Telehealth: Payer: Self-pay | Admitting: Physician Assistant

## 2014-05-20 ENCOUNTER — Telehealth: Payer: Self-pay | Admitting: *Deleted

## 2014-05-20 DIAGNOSIS — E781 Pure hyperglyceridemia: Secondary | ICD-10-CM

## 2014-05-20 MED ORDER — FENOFIBRATE 145 MG PO TABS: 145.0000 mg | ORAL_TABLET | Freq: Every day | ORAL | Status: DC

## 2014-05-20 MED ORDER — ATORVASTATIN CALCIUM 10 MG PO TABS: 10.0000 mg | ORAL_TABLET | Freq: Every day | ORAL | Status: DC

## 2014-05-20 NOTE — Telephone Encounter (Signed)
Insurance will not pay for the Livalo tablets.  His repeat Lipid panel shows his LDL cholesterol is good but Triglycerides remain high.  Let's place him on fenofibrate which whill help with triglycerides and should also be covered by insurance. I have sent in Rx for Tricor 145mg  daily. Follow-up in 2-3 months for repeat fasting lipid panel.

## 2014-05-20 NOTE — Telephone Encounter (Signed)
Ian [name], CMA at 05/20/2014 8:04 AM     Status: Sign at exiting of workspace        [facility] with contact name and number [for return call, if needed] RE: denial of coverage by Insurance for Livalo; new Rx [Fenofibrate] and further provider instructions/SLS         Leeanne Rio, PA-C at 05/20/2014 7:37 AM     Status: Signed        Insurance will not pay for the Livalo tablets. His repeat Lipid panel shows his LDL cholesterol is good but Triglycerides remain high. Let's place him on fenofibrate which whill help with triglycerides and should also be covered by insurance. I have sent in Rx for Tricor 145mg  daily. Follow-up in 2-3 months for repeat fasting lipid panel.

## 2014-05-20 NOTE — Telephone Encounter (Signed)
Patient called to report that he cannot take Fenofibrate [or any drug in its class] d/t past "severe reactions" to this drug; called patient back for more information. Pt reports that Dr. Dion Saucier px Fenofibrate for him in the past, and it resulted in constipation w/anal fissure that led to Sx, and he refuses to take anything that states it has a relation to fenofibrate. Also, entered in allergy list at patient request/SLS Please Advise.

## 2014-05-20 NOTE — Telephone Encounter (Signed)
LMOM with contact name and number [for return call, if needed] RE: denial of coverage by Insurance for Livalo; new Rx [Fenofibrate] and further provider instructions/SLS

## 2014-05-20 NOTE — Telephone Encounter (Signed)
Wil rx lipitor 10 mg daily as his insurance should cover this medication.  Follow-up in 2 months.  Come in fasting for labs.

## 2014-05-22 NOTE — Telephone Encounter (Signed)
LMOM with contact name and number [for return call, if needed] RE: new medication and further provider instructions/SLS  

## 2014-06-01 ENCOUNTER — Other Ambulatory Visit: Payer: BC Managed Care – PPO

## 2014-06-03 DIAGNOSIS — Z86010 Personal history of colonic polyps: Secondary | ICD-10-CM | POA: Insufficient documentation

## 2014-06-08 ENCOUNTER — Ambulatory Visit: Payer: BC Managed Care – PPO | Admitting: Family Medicine

## 2014-07-22 ENCOUNTER — Ambulatory Visit: Payer: BC Managed Care – PPO | Admitting: Physician Assistant

## 2014-07-29 ENCOUNTER — Encounter: Payer: Self-pay | Admitting: Physician Assistant

## 2014-07-29 ENCOUNTER — Ambulatory Visit (INDEPENDENT_AMBULATORY_CARE_PROVIDER_SITE_OTHER): Payer: BC Managed Care – PPO | Admitting: Physician Assistant

## 2014-07-29 VITALS — BP 138/84 | HR 68 | Temp 98.0°F | Resp 16 | Ht 68.0 in | Wt 275.0 lb

## 2014-07-29 DIAGNOSIS — E785 Hyperlipidemia, unspecified: Secondary | ICD-10-CM

## 2014-07-29 DIAGNOSIS — E119 Type 2 diabetes mellitus without complications: Secondary | ICD-10-CM | POA: Insufficient documentation

## 2014-07-29 DIAGNOSIS — I10 Essential (primary) hypertension: Secondary | ICD-10-CM

## 2014-07-29 LAB — COMPLETE METABOLIC PANEL WITH GFR
ALK PHOS: 44 U/L (ref 39–117)
ALT: 37 U/L (ref 0–53)
AST: 30 U/L (ref 0–37)
Albumin: 4.1 g/dL (ref 3.5–5.2)
BILIRUBIN TOTAL: 0.9 mg/dL (ref 0.2–1.2)
BUN: 19 mg/dL (ref 6–23)
CHLORIDE: 98 meq/L (ref 96–112)
CO2: 29 mEq/L (ref 19–32)
CREATININE: 1.2 mg/dL (ref 0.50–1.35)
Calcium: 10.2 mg/dL (ref 8.4–10.5)
GFR, Est African American: 79 mL/min
GFR, Est Non African American: 68 mL/min
Glucose, Bld: 105 mg/dL — ABNORMAL HIGH (ref 70–99)
Potassium: 4.4 mEq/L (ref 3.5–5.3)
Sodium: 136 mEq/L (ref 135–145)
Total Protein: 6.7 g/dL (ref 6.0–8.3)

## 2014-07-29 LAB — LIPID PANEL
CHOL/HDL RATIO: 9.6 ratio
Cholesterol: 260 mg/dL — ABNORMAL HIGH (ref 0–200)
HDL: 27 mg/dL — AB (ref >39)
TRIGLYCERIDES: 1343 mg/dL — AB (ref <150)

## 2014-07-29 MED ORDER — TRIAMCINOLONE ACETONIDE 0.5 % EX CREA: 1.0000 "application " | TOPICAL_CREAM | Freq: Two times a day (BID) | CUTANEOUS | Status: DC

## 2014-07-29 NOTE — Assessment & Plan Note (Signed)
Tolerating Lipitor well.  Will obtain CMP and fasting lipid panel.

## 2014-07-29 NOTE — Assessment & Plan Note (Signed)
Continue current regimen for now.  Diabetic foot exam performed without abnormal findings.  Will obtain CMP, A1C, UA and Urine microalbumin.  Discussed Pneumovax.  Patient declines at today's visit.

## 2014-07-29 NOTE — Progress Notes (Signed)
Pre visit review using our clinic review tool, if applicable. No additional management support is needed unless otherwise documented below in the visit note/SLS  

## 2014-07-29 NOTE — Assessment & Plan Note (Signed)
BP mildly elevated at today's visit.  Patient endorses normotensive BP at home.  Brought a journal of home BP's with him. Mostly normotensive findings.  Asymptomatic.  Continue current regimen for now.  Follow DASH diet.

## 2014-07-29 NOTE — Patient Instructions (Addendum)
Please go to the labs.  I will call you with your results.  Continue medications as directed.  You will be contacted by an Ophthalmologist for  Diabetic eye examination.   Diabetes and Foot Care Diabetes may cause you to have problems because of poor blood supply (circulation) to your feet and legs. This may cause the skin on your feet to become thinner, break easier, and heal more slowly. Your skin may become dry, and the skin may peel and crack. You may also have nerve damage in your legs and feet causing decreased feeling in them. You may not notice minor injuries to your feet that could lead to infections or more serious problems. Taking care of your feet is one of the most important things you can do for yourself.  HOME CARE INSTRUCTIONS  Wear shoes at all times, even in the house. Do not go barefoot. Bare feet are easily injured.  Check your feet daily for blisters, cuts, and redness. If you cannot see the bottom of your feet, use a mirror or ask someone for help.  Wash your feet with warm water (do not use hot water) and mild soap. Then pat your feet and the areas between your toes until they are completely dry. Do not soak your feet as this can dry your skin.  Apply a moisturizing lotion or petroleum jelly (that does not contain alcohol and is unscented) to the skin on your feet and to dry, brittle toenails. Do not apply lotion between your toes.  Trim your toenails straight across. Do not dig under them or around the cuticle. File the edges of your nails with an emery board or nail file.  Do not cut corns or calluses or try to remove them with medicine.  Wear clean socks or stockings every day. Make sure they are not too tight. Do not wear knee-high stockings since they may decrease blood flow to your legs.  Wear shoes that fit properly and have enough cushioning. To break in new shoes, wear them for just a few hours a day. This prevents you from injuring your feet. Always look in your  shoes before you put them on to be sure there are no objects inside.  Do not cross your legs. This may decrease the blood flow to your feet.  If you find a minor scrape, cut, or break in the skin on your feet, keep it and the skin around it clean and dry. These areas may be cleansed with mild soap and water. Do not cleanse the area with peroxide, alcohol, or iodine.  When you remove an adhesive bandage, be sure not to damage the skin around it.  If you have a wound, look at it several times a day to make sure it is healing.  Do not use heating pads or hot water bottles. They may burn your skin. If you have lost feeling in your feet or legs, you may not know it is happening until it is too late.  Make sure your health care provider performs a complete foot exam at least annually or more often if you have foot problems. Report any cuts, sores, or bruises to your health care provider immediately. SEEK MEDICAL CARE IF:   You have an injury that is not healing.  You have cuts or breaks in the skin.  You have an ingrown nail.  You notice redness on your legs or feet.  You feel burning or tingling in your legs or feet.  You  have pain or cramps in your legs and feet.  Your legs or feet are numb.  Your feet always feel cold. SEEK IMMEDIATE MEDICAL CARE IF:   There is increasing redness, swelling, or pain in or around a wound.  There is a red line that goes up your leg.  Pus is coming from a wound.  You develop a fever or as directed by your health care provider.  You notice a bad smell coming from an ulcer or wound. Document Released: 12/08/2000 Document Revised: 08/13/2013 Document Reviewed: 05/20/2013 [facility] Patient Information 2015 [place], [place]. This information is not intended to replace advice given to you by your health care provider. Make sure you discuss any questions you have with your health care provider.

## 2014-07-29 NOTE — Progress Notes (Signed)
Patient presents to clinic today for 110-month follow up of Hypertension, Hyperlipidemia and Diabetes Mellitus II.   Endorses taking Lipitor 10 mg daily.  Denies myalgias or arthralgias.  Will need repeat lipid panel and CMP.  Patient is fasting for labs.   Endorses taking BP medications daily as directed.  Checks BP at home every other day.  Endorses measurements in the 110s-120s/80s.  Denies chest pain, palpitations, lightheadedness, dizziness. BP 138/84 in clinic today.  For diabetes, patient endorses taking Metformin as directed.  Is due for A1C.  Not checking home blood sugars.  Is currently working and walking for exercise.  Is trying to watch diet, but states he could do better.  Past Medical History  Diagnosis Date  . Internal bleeding hemorrhoids 2012    "might bleed once/month; only when I strain"  . Hyperlipidemia   . Clostridium difficile infection   . Testicular hypofunction   . Biceps tendon rupture     bilateral  . GERD (gastroesophageal reflux disease)   . Colon polyps   . Allergy   . Arthritis   . Hypertension   . Diabetes     Borderline/Pre-diabetes  . Leg cramps     Current Outpatient Prescriptions on File Prior to Visit  Medication Sig Dispense Refill  . atorvastatin (LIPITOR) 10 MG tablet Take 1 tablet (10 mg total) by mouth daily.  30 tablet  3  . calcium-vitamin D (OSCAL WITH D) 500-200 MG-UNIT per tablet Take 1 tablet by mouth.      . clomiPHENE (CLOMID) 50 MG tablet Take 25 mg by mouth daily.      . cyclobenzaprine (FLEXERIL) 10 MG tablet Take 10 mg by mouth 3 (three) times daily as needed for muscle spasms.      Marland Kitchen docusate sodium (COLACE) 100 MG capsule Take 100 mg by mouth daily.       Marland Kitchen HYDROcodone-acetaminophen (NORCO/VICODIN) 5-325 MG per tablet       . lisinopril-hydrochlorothiazide (PRINZIDE,ZESTORETIC) 20-12.5 MG per tablet Take 1 tablet by mouth 2 (two) times daily. Takes 1 tablet QAM & 0.5 tablet QHs      . metFORMIN (GLUCOPHAGE-XR) 500 MG 24 hr tablet  Take 1 tablet (500 mg total) by mouth at bedtime.  60 tablet  5  . omega-3 acid ethyl esters (LOVAZA) 1 G capsule Take 2 g by mouth daily.      Marland Kitchen omega-3 fish oil (MAXEPA) 1000 MG CAPS capsule Take 2 capsules by mouth daily.      Marland Kitchen omeprazole (PRILOSEC) 20 MG capsule Take 1 capsule (20 mg total) by mouth daily.  90 capsule  3  . Wheat Dextrin (EQ FIBER POWDER PO) Take by mouth 2 (two) times daily.       No current facility-administered medications on file prior to visit.    Allergies  Allergen Reactions  . Asa [Aspirin] Nausea Only and Rash  . Penicillins Rash  . Pork-Derived Products Diarrhea and Nausea And Vomiting    PER PATIENT REQUEST DELETE, ALLERGIST RESOLVED  . Tetanus Toxoids Anaphylaxis  . Beef-Derived Products Nausea And Vomiting    PER PATIENT REQUEST, DELETE, ALLERGIST RESOLVED  . Daptomycin Other (See Comments)    myalgias  . Prednisone (Pak) [Prednisone] Other (See Comments)    Hyperactivity  . Fenofibrate Palpitations    Constipation w/anal fissure [Sx]    Family History  Problem Relation Age of Onset  . Heart disease Mother 20    Deceased  . Hypertension Mother   . Hyperlipidemia Mother   .  Heart disease Father 106    Deceased  . CVA Father   . Hypertension Maternal Grandmother   . Hyperlipidemia Maternal Grandmother   . Cancer Maternal Grandmother     Colon Cancer  . Heart disease Maternal Grandfather   . Hypertension Maternal Grandfather   . Hyperlipidemia Maternal Grandfather   . Cancer Maternal Grandfather     Lung Cancer  . Asthma Paternal Grandmother   . Heart disease Paternal Grandfather     History   Social History  . Marital Status: Married    Spouse Name: N/A    Number of Children: N/A  . Years of Education: N/A   Social History Main Topics  . Smoking status: Never Smoker   . Smokeless tobacco: Never Used  . Alcohol Use: Yes     Comment: 08/21/2012 "might drink a beer once a year"  . Drug Use: No  . Sexual Activity: Not Currently    Other Topics Concern  . None   Social History Narrative   Marital Status: Married Pamala Hurry)    Children:  Step Daughter Sharyn Lull)    Pets: None    Living Situation: Lives with Pamala Hurry    Occupation: [employer] ([place]    Education: Associate's Degree in [employer]    Tobacco Use/Exposure:  None    Alcohol Use:  Rarely    Drug Use:  None   Diet:  Regular   Exercise: Limited     Hobbies:  Hunting, Fishing             Review of Systems - See HPI.  All other ROS are negative.  BP 138/84  Pulse 68  Temp(Src) 98 F (36.7 C) (Oral)  Resp 16  Ht 5\' 8"  (1.727 m)  Wt 275 lb (124.739 kg)  BMI 41.82 kg/m2  SpO2 98%  Physical Exam  Vitals reviewed. Constitutional: He is oriented to person, place, and time and well-developed, well-nourished, and in no distress.  HENT:  Head: Normocephalic and atraumatic.  Eyes: Conjunctivae are normal. Pupils are equal, round, and reactive to light.  Neck: Neck supple.  Cardiovascular: Normal rate, regular rhythm, normal heart sounds and intact distal pulses.   Pulmonary/Chest: Effort normal and breath sounds normal. No respiratory distress. He has no wheezes. He has no rales. He exhibits no tenderness.  Abdominal: Soft. Bowel sounds are normal. He exhibits no distension and no mass. There is no tenderness. There is no rebound and no guarding.  Lymphadenopathy:    He has no cervical adenopathy.  Neurological: He is alert and oriented to person, place, and time.  Skin: Skin is warm and dry. No rash noted.   Assessment/Plan: Essential hypertension, benign BP mildly elevated at today's visit.  Patient endorses normotensive BP at home.  Brought a journal of home BP's with him. Mostly normotensive findings.  Asymptomatic.  Continue current regimen for now.  Follow DASH diet.  Hyperlipidemia Tolerating Lipitor well.  Will obtain CMP and fasting lipid panel.  Type 2 diabetes mellitus without complication Continue current regimen  for now.  Diabetic foot exam performed without abnormal findings.  Will obtain CMP, A1C, UA and Urine microalbumin.  Discussed Pneumovax.  Patient declines at today's visit.

## 2014-07-30 ENCOUNTER — Telehealth: Payer: Self-pay | Admitting: Physician Assistant

## 2014-07-30 DIAGNOSIS — E781 Pure hyperglyceridemia: Secondary | ICD-10-CM

## 2014-07-30 LAB — URINALYSIS, ROUTINE W REFLEX MICROSCOPIC
Bilirubin Urine: NEGATIVE
GLUCOSE, UA: NEGATIVE mg/dL
Hgb urine dipstick: NEGATIVE
KETONES UR: NEGATIVE mg/dL
Leukocytes, UA: NEGATIVE
Nitrite: NEGATIVE
PH: 5 (ref 5.0–8.0)
Protein, ur: NEGATIVE mg/dL
Specific Gravity, Urine: 1.023 (ref 1.005–1.030)
Urobilinogen, UA: 0.2 mg/dL (ref 0.0–1.0)

## 2014-07-30 LAB — MICROALBUMIN / CREATININE URINE RATIO
Creatinine, Urine: 160 mg/dL
Microalb Creat Ratio: 6.5 mg/g (ref 0.0–30.0)
Microalb, Ur: 1.04 mg/dL (ref 0.00–1.89)

## 2014-07-30 LAB — HEMOGLOBIN A1C
Hgb A1c MFr Bld: 6.9 % — ABNORMAL HIGH (ref <5.7)
Mean Plasma Glucose: 151 mg/dL — ABNORMAL HIGH (ref <117)

## 2014-07-30 NOTE — Telephone Encounter (Signed)
LMOM with contact name and number for return call RE: results and further provider instructions/SLS  

## 2014-07-30 NOTE — Telephone Encounter (Signed)
Lab results are in.  Look good overall.  A1C is stable.  Continue current dose of Metformin XR.  TGL are extremely elevated, putting him at risk for pancreatic infection.  He needs to stop any and all alcohol consumption and avoid refined sugar.  We also need to switch up his medications.  Fibrates are the best medicine for this, but I see he has an allergy to fibrates (palpitations).  I would like to know more about this.  It seems he was on a fibrate for a long time before ever having a side effect.  If we cannot use a fibrate, then we will need to increase his lipitor and add welchol to his regimen for a short time to lower his triglycerides.  Please let me know more detail about the reaction to fibrates.

## 2014-07-31 MED ORDER — ATORVASTATIN CALCIUM 20 MG PO TABS: 20.0000 mg | ORAL_TABLET | Freq: Every day | ORAL | Status: DC

## 2014-07-31 MED ORDER — COLESEVELAM HCL 625 MG PO TABS: 1250.0000 mg | ORAL_TABLET | Freq: Two times a day (BID) | ORAL | Status: DC

## 2014-07-31 MED ORDER — ATORVASTATIN CALCIUM 20 MG PO TABS: 10.0000 mg | ORAL_TABLET | Freq: Every day | ORAL | Status: DC

## 2014-07-31 NOTE — Telephone Encounter (Signed)
Medications sent to pharmacy. Follow-up in 1 week.

## 2014-07-31 NOTE — Telephone Encounter (Signed)
Patient informed, understood & reports that fibrates caused "severe constipation, anal fissure that caused him to have to have surgery"; pt refuses to take again, informed that after provide responds with new medication and/or doses that I will return call w/that information, verified pharmacy on file [Ok to LMOM]/SLS

## 2014-07-31 NOTE — Telephone Encounter (Signed)
Patient informed, understood & agreed/SLS  

## 2014-08-12 ENCOUNTER — Encounter: Payer: Self-pay | Admitting: Physician Assistant

## 2014-08-12 ENCOUNTER — Ambulatory Visit (INDEPENDENT_AMBULATORY_CARE_PROVIDER_SITE_OTHER): Payer: BC Managed Care – PPO | Admitting: Physician Assistant

## 2014-08-12 VITALS — BP 110/92 | HR 93 | Temp 98.3°F | Resp 16 | Ht 68.0 in | Wt 270.8 lb

## 2014-08-12 DIAGNOSIS — E669 Obesity, unspecified: Secondary | ICD-10-CM

## 2014-08-12 DIAGNOSIS — E781 Pure hyperglyceridemia: Secondary | ICD-10-CM

## 2014-08-12 DIAGNOSIS — T63461A Toxic effect of venom of wasps, accidental (unintentional), initial encounter: Secondary | ICD-10-CM

## 2014-08-12 DIAGNOSIS — T63441A Toxic effect of venom of bees, accidental (unintentional), initial encounter: Secondary | ICD-10-CM

## 2014-08-12 DIAGNOSIS — T6391XA Toxic effect of contact with unspecified venomous animal, accidental (unintentional), initial encounter: Secondary | ICD-10-CM

## 2014-08-12 MED ORDER — PHENTERMINE HCL 15 MG PO CAPS: 15.0000 mg | ORAL_CAPSULE | ORAL | Status: DC

## 2014-08-12 NOTE — Progress Notes (Signed)
Pre visit review using our clinic review tool, if applicable. No additional management support is needed unless otherwise documented below in the visit note/SLS  

## 2014-08-12 NOTE — Patient Instructions (Signed)
Please obtain labs. I will call you with your results.    Take Phentermine daily as directed.  Follow-up with me in 1 month.  If you develop racing heart or palpitations while on medication, please stop the medication and call the office.  Continue with diet and exercise.  You will be contacted by the Diabetic Nutritionist.   For now stop the metformin.  I have called in a glucometer and supplies. Begin checking your blood sugar before breakfast a couple of times a week.  Write down and bring to follow-up.  Goal AM blood sugar is 70-110.

## 2014-08-12 NOTE — Progress Notes (Signed)
Patient presents to clinic today for followup of elevated triglycerides after starting medication. Patient endorses taking WelChol and Lipitor as directed. Denies myalgias. Has been limiting intake of carbohydrates, profound sugars and alcohol. Denies abdominal pain, nausea or vomiting.  Patient complains of pain and itching of the face and left hand at the site of bee stings that he incurred yesterday. Denies difficulty breathing or significant facial swelling. Has used some Benadryl cream with some relief of itch. States sites are still painful.  Patient endorses difficulty with weight loss despite dietary and exercise measures. Feels that his weight is contributing to his hypertension and diabetes. Wishes to discuss medications for weight loss.    Past Medical History  Diagnosis Date  . Internal bleeding hemorrhoids 2012    "might bleed once/month; only when I strain"  . Hyperlipidemia   . Clostridium difficile infection   . Testicular hypofunction   . Biceps tendon rupture     bilateral  . GERD (gastroesophageal reflux disease)   . Colon polyps   . Allergy   . Arthritis   . Hypertension   . Diabetes     Borderline/Pre-diabetes  . Leg cramps     Current Outpatient Prescriptions on File Prior to Visit  Medication Sig Dispense Refill  . atorvastatin (LIPITOR) 20 MG tablet Take 1 tablet (20 mg total) by mouth daily.  30 tablet  3  . clomiPHENE (CLOMID) 50 MG tablet Take 25 mg by mouth daily.      . colesevelam (WELCHOL) 625 MG tablet Take 2 tablets (1,250 mg total) by mouth 2 (two) times daily with a meal. Do not take at the same time as other oral medications.  60 tablet  1  . cyclobenzaprine (FLEXERIL) 10 MG tablet Take 10 mg by mouth 3 (three) times daily as needed for muscle spasms.      Marland Kitchen docusate sodium (COLACE) 100 MG capsule Take 100 mg by mouth daily.       Marland Kitchen lisinopril-hydrochlorothiazide (PRINZIDE,ZESTORETIC) 20-12.5 MG per tablet Take 1 tablet by mouth 2 (two) times daily.  Takes 1 tablet QAM & 0.5 tablet QHs      . metFORMIN (GLUCOPHAGE-XR) 500 MG 24 hr tablet Take 1 tablet (500 mg total) by mouth at bedtime.  60 tablet  5  . omega-3 acid ethyl esters (LOVAZA) 1 G capsule Take 2 g by mouth daily.      Marland Kitchen omega-3 fish oil (MAXEPA) 1000 MG CAPS capsule Take 2 capsules by mouth daily.      Marland Kitchen omeprazole (PRILOSEC) 20 MG capsule Take 1 capsule (20 mg total) by mouth daily.  90 capsule  3  . Wheat Dextrin (EQ FIBER POWDER PO) Take 2 each by mouth 3 (three) times daily.        No current facility-administered medications on file prior to visit.    Allergies  Allergen Reactions  . Asa [Aspirin] Nausea Only and Rash  . Penicillins Rash  . Pork-Derived Products Diarrhea and Nausea And Vomiting    PER PATIENT REQUEST DELETE, ALLERGIST RESOLVED  . Tetanus Toxoids Anaphylaxis  . Daptomycin Other (See Comments)    myalgias  . Prednisone (Pak) [Prednisone] Other (See Comments)    Hyperactivity  . Fenofibrate Palpitations    Constipation w/anal fissure [Sx]    Family History  Problem Relation Age of Onset  . Heart disease Mother 32    Deceased  . Hypertension Mother   . Hyperlipidemia Mother   . Heart disease Father 65  Deceased  . CVA Father   . Hypertension Maternal Grandmother   . Hyperlipidemia Maternal Grandmother   . Cancer Maternal Grandmother     Colon Cancer  . Heart disease Maternal Grandfather   . Hypertension Maternal Grandfather   . Hyperlipidemia Maternal Grandfather   . Cancer Maternal Grandfather     Lung Cancer  . Asthma Paternal Grandmother   . Heart disease Paternal Grandfather     History   Social History  . Marital Status: Married    Spouse Name: N/A    Number of Children: N/A  . Years of Education: N/A   Social History Main Topics  . Smoking status: Never Smoker   . Smokeless tobacco: Never Used  . Alcohol Use: Yes     Comment: 08/21/2012 "might drink a beer once a year"  . Drug Use: No  . Sexual Activity: Not  Currently   Other Topics Concern  . None   Social History Narrative   Marital Status: Married Pamala Hurry)    Children:  Step Daughter Sharyn Lull)    Pets: None    Living Situation: Lives with Pamala Hurry    Occupation: [employer] ([place])    Education: Associate's Degree in [employer]    Tobacco Use/Exposure:  None    Alcohol Use:  Rarely    Drug Use:  None   Diet:  Regular   Exercise: Limited     Hobbies:  Hunting, Fishing              Review of Systems - See HPI.  All other ROS are negative.  BP 110/92  Pulse 93  Temp(Src) 98.3 F (36.8 C) (Oral)  Resp 16  Ht 5' 8"  (1.727 m)  Wt 270 lb 12 oz (122.811 kg)  BMI 41.18 kg/m2  SpO2 97%  Physical Exam  Vitals reviewed. Constitutional: He is oriented to person, place, and time and well-developed, well-nourished, and in no distress.  Cardiovascular: Normal rate, regular rhythm, normal heart sounds and intact distal pulses.   Pulmonary/Chest: Effort normal and breath sounds normal. No respiratory distress. He has no wheezes. He has no rales. He exhibits no tenderness.  Neurological: He is alert and oriented to person, place, and time.  Skin: Skin is warm and dry. No rash noted.  Psychiatric: Affect normal.    Recent Results (from the past 2160 hour(s))  COMPLETE METABOLIC PANEL WITH GFR     Status: Abnormal   Collection Time    07/29/14  3:46 PM      Result Value Ref Range   Sodium 136  135 - 145 mEq/L   Potassium 4.4  3.5 - 5.3 mEq/L   Chloride 98  96 - 112 mEq/L   CO2 29  19 - 32 mEq/L   Glucose, Bld 105 (*) 70 - 99 mg/dL   BUN 19  6 - 23 mg/dL   Creat 1.20  0.50 - 1.35 mg/dL   Total Bilirubin 0.9  0.2 - 1.2 mg/dL   Alkaline Phosphatase 44  39 - 117 U/L   AST 30  0 - 37 U/L   ALT 37  0 - 53 U/L   Total Protein 6.7  6.0 - 8.3 g/dL   Albumin 4.1  3.5 - 5.2 g/dL   Calcium 10.2  8.4 - 10.5 mg/dL   GFR, Est African American 79     GFR, Est Non African American 68     Comment:       The estimated GFR is  a  calculation valid for adults (>=20 years old)     that uses the CKD-EPI algorithm to adjust for age and sex. It is       not to be used for children, pregnant women, hospitalized patients,        patients on dialysis, or with rapidly changing kidney function.     According to the NKDEP, eGFR >89 is normal, 60-89 shows mild     impairment, 30-59 shows moderate impairment, 15-29 shows severe     impairment and <15 is ESRD.        LIPID PANEL     Status: Abnormal   Collection Time    07/29/14  3:46 PM      Result Value Ref Range   Cholesterol 260 (*) 0 - 200 mg/dL   Comment: ATP III Classification:           < 200        mg/dL        Desirable          200 - 239     mg/dL        Borderline High          >= 240        mg/dL        High         Triglycerides 1343 (*) <150 mg/dL   Comment: Result confirmed by automatic dilution.     Result repeated and verified.   HDL 27 (*) >39 mg/dL   Total CHOL/HDL Ratio 9.6     VLDL NOT CALC  0 - 40 mg/dL   Comment:       Not calculated due to Triglyceride >400.     Suggest ordering Direct LDL (Unit Code: [phone]).   LDL Cholesterol NOT CALC  0 - 99 mg/dL   Comment:       Not calculated due to Triglyceride >400.     Suggest ordering Direct LDL (Unit Code: [phone]).           Total Cholesterol/HDL Ratio:CHD Risk                            Coronary Heart Disease Risk Table                                            Men       Women              1/2 Average Risk              3.4        3.3                  Average Risk              5.0        4.4               2X Average Risk              9.6        7.1               3X Average Risk             23.4       11.0     Use the  calculated Patient Ratio above and the CHD Risk table      to determine the patient's CHD Risk.     ATP III Classification (LDL):           < 100        mg/dL         Optimal          100 - 129     mg/dL         Near or Above Optimal          130 - 159     mg/dL         Borderline  High          160 - 189     mg/dL         High           > 190        mg/dL         Very High        MICROALBUMIN / CREATININE URINE RATIO     Status: None   Collection Time    07/29/14  3:46 PM      Result Value Ref Range   Microalb, Ur 1.04  0.00 - 1.89 mg/dL   Creatinine, Urine 160.0     Microalb Creat Ratio 6.5  0.0 - 30.0 mg/g  URINALYSIS, ROUTINE W REFLEX MICROSCOPIC     Status: None   Collection Time    07/29/14  3:46 PM      Result Value Ref Range   Color, Urine YELLOW  YELLOW   APPearance CLEAR  CLEAR   Specific Gravity, Urine 1.023  1.005 - 1.030   pH 5.0  5.0 - 8.0   Glucose, UA NEG  NEG mg/dL   Bilirubin Urine NEG  NEG   Ketones, ur NEG  NEG mg/dL   Hgb urine dipstick NEG  NEG   Protein, ur NEG  NEG mg/dL   Urobilinogen, UA 0.2  0.0 - 1.0 mg/dL   Nitrite NEG  NEG   Leukocytes, UA NEG  NEG  HEMOGLOBIN A1C     Status: Abnormal   Collection Time    07/29/14  3:46 PM      Result Value Ref Range   Hemoglobin A1C 6.9 (*) <5.7 %   Comment:                                                                            According to the ADA Clinical Practice Recommendations for 2011, when     HbA1c is used as a screening test:             >=6.5%   Diagnostic of Diabetes Mellitus                (if abnormal result is confirmed)           5.7-6.4%   Increased risk of developing Diabetes Mellitus           References:Diagnosis and Classification of Diabetes Mellitus,Diabetes     PJSR,1594,58(PFYTW 1):S62-S69 and Standards of Medical Care in             Diabetes - 2011,Diabetes KMQK,8638,17 (Suppl  1):S11-S61.         Mean Plasma Glucose 151 (*) <117 mg/dL  LIPID PANEL     Status: Abnormal   Collection Time    08/12/14  2:56 PM      Result Value Ref Range   Cholesterol 152  0 - 200 mg/dL   Comment: ATP III Classification:           < 200        mg/dL        Desirable          200 - 239     mg/dL        Borderline High          >= 240        mg/dL        High          Triglycerides 546 (*) <150 mg/dL   HDL 31 (*) >39 mg/dL   Total CHOL/HDL Ratio 4.9     VLDL NOT CALC  0 - 40 mg/dL   Comment:       Not calculated due to Triglyceride >400.     Suggest ordering Direct LDL (Unit Code: [id number]).   LDL Cholesterol NOT CALC  0 - 99 mg/dL   Comment:       Not calculated due to Triglyceride >400.     Suggest ordering Direct LDL (Unit Code: [phone]).           Total Cholesterol/HDL Ratio:CHD Risk                            Coronary Heart Disease Risk Table                                            Men       Women              1/2 Average Risk              3.4        3.3                  Average Risk              5.0        4.4               2X Average Risk              9.6        7.1               3X Average Risk             23.4       11.0     Use the calculated Patient Ratio above and the CHD Risk table      to determine the patient's CHD Risk.     ATP III Classification (LDL):           < 100        mg/dL         Optimal          100 - 129     mg/dL         Near or Above Optimal          130 - 159  mg/dL         Borderline High          160 - 189     mg/dL         High           > 190        mg/dL         Very High          Assessment/Plan: Hypertriglyceridemia Patient tolerating combination of WelChol and Lipitor. We'll continue current regimen to lower triglycerides. Will obtain repeat labs today. Patient is to continue dietary measures.  Obesity, unspecified Handout given on diabetic diet. Encouraged increased physical activity. Referral placed a diabetic nutritionist. Will Rx phentermine as benefits outweigh risk. We'll start at lowest dose possible. Followup in one month.  Bee sting Localized reaction. Encouraged cool compresses, antihistamine and rest. Sarna lotion may be of benefit.

## 2014-08-13 ENCOUNTER — Telehealth: Payer: Self-pay | Admitting: Physician Assistant

## 2014-08-13 ENCOUNTER — Telehealth: Payer: Self-pay

## 2014-08-13 LAB — LIPID PANEL
CHOL/HDL RATIO: 4.9 ratio
Cholesterol: 152 mg/dL (ref 0–200)
HDL: 31 mg/dL — ABNORMAL LOW (ref >39)
TRIGLYCERIDES: 546 mg/dL — AB (ref <150)

## 2014-08-13 MED ORDER — GLUCOSE BLOOD VI STRP: ORAL_STRIP | Status: DC

## 2014-08-13 MED ORDER — RELION CONFIRM GLUCOSE MONITOR W/DEVICE KIT
PACK | Status: DC
Start: 2014-08-13 — End: 2014-08-13

## 2014-08-13 MED ORDER — GLUCOSE BLOOD VI STRP
ORAL_STRIP | Status: DC
Start: 2014-08-13 — End: 2015-06-22

## 2014-08-13 MED ORDER — RELION CONFIRM GLUCOSE MONITOR W/DEVICE KIT: PACK | Status: AC

## 2014-08-13 NOTE — Telephone Encounter (Signed)
Pt stated that "he was in the office 8/19 and Dr. Hassell Done was going to call in a Glucometer. He wanted to know which pharmacy he sent the glucometer to?"LDM

## 2014-08-13 NOTE — Telephone Encounter (Signed)
Patient wife called in stating that Ian [name] was going to send in an Rx for a glucose meter for patient. Please send to [facility] [address] in [place]

## 2014-08-13 NOTE — Telephone Encounter (Signed)
Left message on voice mail to notify pt of where the Rx was sent. LDM

## 2014-08-13 NOTE — Telephone Encounter (Signed)
They will be sent to a [facility].  I forgot to ask him yesterday which one he would like them sent to.  I am calling in the [facility] brand ([facility]) which should be relatively inexpensive

## 2014-08-13 NOTE — Telephone Encounter (Signed)
Rx request to pharmacy; Patient informed, understood/SLS

## 2014-08-17 DIAGNOSIS — T63441A Toxic effect of venom of bees, accidental (unintentional), initial encounter: Secondary | ICD-10-CM | POA: Insufficient documentation

## 2014-08-17 DIAGNOSIS — E669 Obesity, unspecified: Secondary | ICD-10-CM | POA: Insufficient documentation

## 2014-08-17 DIAGNOSIS — E781 Pure hyperglyceridemia: Secondary | ICD-10-CM | POA: Insufficient documentation

## 2014-08-17 NOTE — Assessment & Plan Note (Addendum)
Handout given on diabetic diet. Encouraged increased physical activity. Referral placed a diabetic nutritionist. Will Rx phentermine as benefits outweigh risk. We'll start at lowest dose possible. Followup in one month.

## 2014-08-17 NOTE — Assessment & Plan Note (Signed)
Patient tolerating combination of WelChol and Lipitor. We'll continue current regimen to lower triglycerides. Will obtain repeat labs today. Patient is to continue dietary measures.

## 2014-08-17 NOTE — Assessment & Plan Note (Signed)
Localized reaction. Encouraged cool compresses, antihistamine and rest. Sarna lotion may be of benefit.

## 2014-09-01 ENCOUNTER — Other Ambulatory Visit: Payer: Self-pay | Admitting: Physician Assistant

## 2014-09-14 ENCOUNTER — Telehealth: Payer: Self-pay | Admitting: Physician Assistant

## 2014-09-14 DIAGNOSIS — E785 Hyperlipidemia, unspecified: Secondary | ICD-10-CM

## 2014-09-14 NOTE — Telephone Encounter (Signed)
Patient informed that he is up to date on labs and there is nothing in last OV note to indicate that any future labs needed prior to F/U appt on 09.25.15/SLS Please advise if there is need not seen.

## 2014-09-14 NOTE — Telephone Encounter (Signed)
LMOM @  (4:19pm @ [phone]) informing the pt that he will need a fasting lipid panel to reassess triglycerides.  Asking the pt to call the office to schedule a lab appt.  Future lab ordered and sent.//AB/CMA

## 2014-09-14 NOTE — Telephone Encounter (Signed)
Needs repeat fasting lipid panel to reassess triglycerides.

## 2014-09-14 NOTE — Telephone Encounter (Signed)
Pt states he was told to call ahead to get his orders put in for his labs, prior to his appt on 9/25, pt would like to know if he can come this morning, states he is fasting would like for you to call him this morning and make him aware if its ok for him to come.

## 2014-09-18 ENCOUNTER — Encounter: Payer: Self-pay | Admitting: Physician Assistant

## 2014-09-18 ENCOUNTER — Ambulatory Visit (INDEPENDENT_AMBULATORY_CARE_PROVIDER_SITE_OTHER): Payer: BC Managed Care – PPO | Admitting: Physician Assistant

## 2014-09-18 VITALS — BP 132/86 | HR 82 | Temp 98.2°F | Resp 16 | Ht 68.0 in | Wt 265.2 lb

## 2014-09-18 DIAGNOSIS — E785 Hyperlipidemia, unspecified: Secondary | ICD-10-CM

## 2014-09-18 DIAGNOSIS — Z23 Encounter for immunization: Secondary | ICD-10-CM

## 2014-09-18 DIAGNOSIS — E119 Type 2 diabetes mellitus without complications: Secondary | ICD-10-CM

## 2014-09-18 DIAGNOSIS — E669 Obesity, unspecified: Secondary | ICD-10-CM

## 2014-09-18 DIAGNOSIS — E781 Pure hyperglyceridemia: Secondary | ICD-10-CM

## 2014-09-18 NOTE — Patient Instructions (Signed)
Please continue the Welchol.  I will try to get the authorization for your insurance to cover the Livalo.   Continue diet and exercise.  Keep checking your blood sugar each morning.  You will be contacted by a nutritionist.  We will schedule follow-up based on your lab results.

## 2014-09-18 NOTE — Progress Notes (Signed)
Pre visit review using our clinic review tool, if applicable. No additional management support is needed unless otherwise documented below in the visit note/SLS  

## 2014-09-19 LAB — LIPID PANEL W/DIRECT LDL/HDL RATIO
Cholesterol: 186 mg/dL (ref 0–200)
Direct LDL: 39 mg/dL
HDL: 31 mg/dL — ABNORMAL LOW (ref >39)
LDL:HDL Ratio: 1.3 Ratio
Total Chol/HDL Ratio: 6 Ratio
Triglycerides: 539 mg/dL — ABNORMAL HIGH (ref <150)

## 2014-09-20 NOTE — Assessment & Plan Note (Signed)
Will recheck TGL.  Patient will need to be on medication in addition to [facility] as he refused fibrates AMA and is intolerant to most statins.  Has tolerated Livalo previously.  Will make attempt to get medication approved through insurance.

## 2014-09-20 NOTE — Assessment & Plan Note (Signed)
Fasting glucose measurements are within target range with TLC alone.  Will repeat A1C when due in 2 months. Referral placed for Medical Nutrition Therapy.

## 2014-09-20 NOTE — Assessment & Plan Note (Signed)
Phentermine discontinued due to side effect.  Will attempt more rigorous TLC.  Referral to Nutrition placed.  Encouraged using the pool at the gym for swimming and water aerobics to help cut fat while being easy on the joints.

## 2014-09-20 NOTE — Progress Notes (Signed)
Patient presents to clinic today for follow-up of obesity and hypertriglyceridemia.  Patient given trial of phentermine at last visit.  States he took medication for 2 weeks but discontinued the medication as it caused nausea and severe insomnia.  Symptoms have resolved since discontinuation of medication.  Patient wishes to continue more intensive diet and exercise changes.  Is continuing to take Welchol as directed, but discontinued Lipitor due to myalgias.  Patient is on this combination for hypertriglyceridemia as patient refuses to take fibrates.  Patient due for repeat labs.  Patient has blood glucose log with him at visit.  Blood glucose is staying at goal despite removal of metformin.    Past Medical History  Diagnosis Date  . Internal bleeding hemorrhoids 2012    "might bleed once/month; only when I strain"  . Hyperlipidemia   . Clostridium difficile infection   . Testicular hypofunction   . Biceps tendon rupture     bilateral  . GERD (gastroesophageal reflux disease)   . Colon polyps   . Allergy   . Arthritis   . Hypertension   . Diabetes     Borderline/Pre-diabetes  . Leg cramps     Current Outpatient Prescriptions on File Prior to Visit  Medication Sig Dispense Refill  . Blood Glucose Monitoring Suppl (RELION CONFIRM GLUCOSE MONITOR) W/DEVICE KIT USE AS DIRECTED TO TEST BLOOD GLUCOSE DAILY DX: 250.00  1 kit  0  . Cholecalciferol (VITAMIN D-3) 5000 UNITS TABS Take 1 tablet by mouth daily.      . clomiPHENE (CLOMID) 50 MG tablet Take 25 mg by mouth daily.      . cyclobenzaprine (FLEXERIL) 10 MG tablet Take 10 mg by mouth 3 (three) times daily as needed for muscle spasms.      Marland Kitchen docusate sodium (COLACE) 100 MG capsule Take 100 mg by mouth daily.       Marland Kitchen glucose blood test strip RELION BLOOD GLUCOSE TEST STRIP Use as instructed to test daily Dx: 250.00  50 each  11  . lisinopril-hydrochlorothiazide (PRINZIDE,ZESTORETIC) 20-12.5 MG per tablet Take 1 tablet by mouth 2 (two)  times daily. Takes 1 tablet QAM & 0.5 tablet QHs      . metFORMIN (GLUCOPHAGE-XR) 500 MG 24 hr tablet Take 1 tablet (500 mg total) by mouth at bedtime.  60 tablet  5  . omega-3 fish oil (MAXEPA) 1000 MG CAPS capsule Take 2 capsules by mouth daily.      Marland Kitchen omeprazole (PRILOSEC) 20 MG capsule Take 1 capsule (20 mg total) by mouth daily.  90 capsule  3  . triamcinolone cream (KENALOG) 0.5 % Apply 1 application topically 2 (two) times daily as needed. To affected areas.      . WELCHOL 625 MG tablet TAKE 2 TABLETS BY MOUTH TWICE A DAY WITH A MEAL - DO NOT TAKE AT SAME TIME AS OTHER MEDICATIONS  60 tablet  1  . Wheat Dextrin (EQ FIBER POWDER PO) Take 2 each by mouth 3 (three) times daily.        No current facility-administered medications on file prior to visit.    Allergies  Allergen Reactions  . Asa [Aspirin] Nausea Only and Rash  . Penicillins Rash  . Pork-Derived Products Diarrhea and Nausea And Vomiting    PER PATIENT REQUEST DELETE, ALLERGIST RESOLVED  . Tetanus Toxoids Anaphylaxis  . Daptomycin Other (See Comments)    myalgias  . Prednisone (Pak) [Prednisone] Other (See Comments)    Hyperactivity  . Fenofibrate Palpitations  Constipation w/anal fissure [Sx]    Family History  Problem Relation Age of Onset  . Heart disease Mother 96    Deceased  . Hypertension Mother   . Hyperlipidemia Mother   . Heart disease Father 63    Deceased  . CVA Father   . Hypertension Maternal Grandmother   . Hyperlipidemia Maternal Grandmother   . Cancer Maternal Grandmother     Colon Cancer  . Heart disease Maternal Grandfather   . Hypertension Maternal Grandfather   . Hyperlipidemia Maternal Grandfather   . Cancer Maternal Grandfather     Lung Cancer  . Asthma Paternal Grandmother   . Heart disease Paternal Grandfather     History   Social History  . Marital Status: Married    Spouse Name: N/A    Number of Children: N/A  . Years of Education: N/A   Social History Main Topics  .  Smoking status: Never Smoker   . Smokeless tobacco: Never Used  . Alcohol Use: Yes     Comment: 08/21/2012 "might drink a beer once a year"  . Drug Use: No  . Sexual Activity: Not Currently   Other Topics Concern  . None   Social History Narrative   Marital Status: Married Pamala Hurry)    Children:  Step Daughter Sharyn Lull)    Pets: None    Living Situation: Lives with Pamala Hurry    Occupation: [employer] ([place])    Education: Associate's Degree in [employer]    Tobacco Use/Exposure:  None    Alcohol Use:  Rarely    Drug Use:  None   Diet:  Regular   Exercise: Limited     Hobbies:  Hunting, Fishing              Review of Systems - See HPI.  All other ROS are negative.  BP 132/86  Pulse 82  Temp(Src) 98.2 F (36.8 C) (Oral)  Resp 16  Ht 5' 8"  (1.727 m)  Wt 265 lb 4 oz (120.317 kg)  BMI 40.34 kg/m2  SpO2 98%  Physical Exam  Vitals reviewed. Constitutional: He is oriented to person, place, and time and well-developed, well-nourished, and in no distress.  HENT:  Head: Normocephalic and atraumatic.  Eyes: Conjunctivae are normal. Pupils are equal, round, and reactive to light.  Neck: Neck supple.  Cardiovascular: Normal rate, regular rhythm, normal heart sounds and intact distal pulses.   Pulmonary/Chest: Effort normal and breath sounds normal. No respiratory distress. He has no wheezes. He has no rales. He exhibits no tenderness.  Lymphadenopathy:    He has no cervical adenopathy.  Neurological: He is alert and oriented to person, place, and time.  Skin: Skin is warm and dry. No rash noted.  Psychiatric: Affect normal.    Recent Results (from the past 2160 hour(s))  COMPLETE METABOLIC PANEL WITH GFR     Status: Abnormal   Collection Time    07/29/14  3:46 PM      Result Value Ref Range   Sodium 136  135 - 145 mEq/L   Potassium 4.4  3.5 - 5.3 mEq/L   Chloride 98  96 - 112 mEq/L   CO2 29  19 - 32 mEq/L   Glucose, Bld 105 (*) 70 - 99 mg/dL   BUN 19  6  - 23 mg/dL   Creat 1.20  0.50 - 1.35 mg/dL   Total Bilirubin 0.9  0.2 - 1.2 mg/dL   Alkaline Phosphatase 44  39 - 117  U/L   AST 30  0 - 37 U/L   ALT 37  0 - 53 U/L   Total Protein 6.7  6.0 - 8.3 g/dL   Albumin 4.1  3.5 - 5.2 g/dL   Calcium 10.2  8.4 - 10.5 mg/dL   GFR, Est African American 79     GFR, Est Non African American 68     Comment:       The estimated GFR is a calculation valid for adults (>=93 years old)     that uses the CKD-EPI algorithm to adjust for age and sex. It is       not to be used for children, pregnant women, hospitalized patients,        patients on dialysis, or with rapidly changing kidney function.     According to the NKDEP, eGFR >89 is normal, 60-89 shows mild     impairment, 30-59 shows moderate impairment, 15-29 shows severe     impairment and <15 is ESRD.        LIPID PANEL     Status: Abnormal   Collection Time    07/29/14  3:46 PM      Result Value Ref Range   Cholesterol 260 (*) 0 - 200 mg/dL   Comment: ATP III Classification:           < 200        mg/dL        Desirable          200 - 239     mg/dL        Borderline High          >= 240        mg/dL        High         Triglycerides 1343 (*) <150 mg/dL   Comment: Result confirmed by automatic dilution.     Result repeated and verified.   HDL 27 (*) >39 mg/dL   Total CHOL/HDL Ratio 9.6     VLDL NOT CALC  0 - 40 mg/dL   Comment:       Not calculated due to Triglyceride >400.     Suggest ordering Direct LDL (Unit Code: [phone]).   LDL Cholesterol NOT CALC  0 - 99 mg/dL   Comment:       Not calculated due to Triglyceride >400.     Suggest ordering Direct LDL (Unit Code: [phone]).           Total Cholesterol/HDL Ratio:CHD Risk                            Coronary Heart Disease Risk Table                                            Men       Women              1/2 Average Risk              3.4        3.3                  Average Risk              5.0        4.4  2X Average Risk               9.6        7.1               3X Average Risk             23.4       11.0     Use the calculated Patient Ratio above and the CHD Risk table      to determine the patient's CHD Risk.     ATP III Classification (LDL):           < 100        mg/dL         Optimal          100 - 129     mg/dL         Near or Above Optimal          130 - 159     mg/dL         Borderline High          160 - 189     mg/dL         High           > 190        mg/dL         Very High        MICROALBUMIN / CREATININE URINE RATIO     Status: None   Collection Time    07/29/14  3:46 PM      Result Value Ref Range   Microalb, Ur 1.04  0.00 - 1.89 mg/dL   Creatinine, Urine 160.0     Microalb Creat Ratio 6.5  0.0 - 30.0 mg/g  URINALYSIS, ROUTINE W REFLEX MICROSCOPIC     Status: None   Collection Time    07/29/14  3:46 PM      Result Value Ref Range   Color, Urine YELLOW  YELLOW   APPearance CLEAR  CLEAR   Specific Gravity, Urine 1.023  1.005 - 1.030   pH 5.0  5.0 - 8.0   Glucose, UA NEG  NEG mg/dL   Bilirubin Urine NEG  NEG   Ketones, ur NEG  NEG mg/dL   Hgb urine dipstick NEG  NEG   Protein, ur NEG  NEG mg/dL   Urobilinogen, UA 0.2  0.0 - 1.0 mg/dL   Nitrite NEG  NEG   Leukocytes, UA NEG  NEG  HEMOGLOBIN A1C     Status: Abnormal   Collection Time    07/29/14  3:46 PM      Result Value Ref Range   Hemoglobin A1C 6.9 (*) <5.7 %   Comment:                                                                            According to the ADA Clinical Practice Recommendations for 2011, when     HbA1c is used as a screening test:             >=6.5%   Diagnostic of Diabetes Mellitus                (if  abnormal result is confirmed)           5.7-6.4%   Increased risk of developing Diabetes Mellitus           References:Diagnosis and Classification of Diabetes Mellitus,Diabetes     CHEN,2778,24(MPNTI 1):S62-S69 and Standards of Medical Care in             Diabetes - 2011,Diabetes RWER,1540,08 (Suppl  1):S11-S61.         Mean Plasma Glucose 151 (*) <117 mg/dL  LIPID PANEL     Status: Abnormal   Collection Time    08/12/14  2:56 PM      Result Value Ref Range   Cholesterol 152  0 - 200 mg/dL   Comment: ATP III Classification:           < 200        mg/dL        Desirable          200 - 239     mg/dL        Borderline High          >= 240        mg/dL        High         Triglycerides 546 (*) <150 mg/dL   HDL 31 (*) >39 mg/dL   Total CHOL/HDL Ratio 4.9     VLDL NOT CALC  0 - 40 mg/dL   Comment:       Not calculated due to Triglyceride >400.     Suggest ordering Direct LDL (Unit Code: [phone]).   LDL Cholesterol NOT CALC  0 - 99 mg/dL   Comment:       Not calculated due to Triglyceride >400.     Suggest ordering Direct LDL (Unit Code: [phone]).           Total Cholesterol/HDL Ratio:CHD Risk                            Coronary Heart Disease Risk Table                                            Men       Women              1/2 Average Risk              3.4        3.3                  Average Risk              5.0        4.4               2X Average Risk              9.6        7.1               3X Average Risk             23.4       11.0     Use the calculated Patient Ratio above and the CHD Risk table      to determine the patient's CHD Risk.     ATP III Classification (LDL):           <  100        mg/dL         Optimal          100 - 129     mg/dL         Near or Above Optimal          130 - 159     mg/dL         Borderline High          160 - 189     mg/dL         High           > 190        mg/dL         Very High        LIPID PANEL W/DIRECT LDL/HDL RATIO     Status: Abnormal   Collection Time    09/18/14  4:49 PM      Result Value Ref Range   Cholesterol 186  0 - 200 mg/dL   Triglycerides 539 (*) <150 mg/dL   HDL 31 (*) >39 mg/dL   Total Chol/HDL Ratio 6.0     Direct LDL 39     Comment: ATP III Classification (LDL):           < 100        mg/dL         Optimal           100 - 129     mg/dL         Near or Above Optimal          130 - 159     mg/dL         Borderline High          160 - 189     mg/dL         High           > 190        mg/dL         Very High         LDL:HDL Ratio 1.3     Comment:                        LDL/HDL Ratio Table                                            Men       Women              1/2 Average Risk              1.0        1.5                  Average Risk              3.6        3.2              2 X Average Risk              6.3        5.0              3 X Average Risk              8.0  6.1           [Kannel WB, Castelli WP, and Darcus [employer] Int Med, 1979, 90:85]          Assessment/Plan: Type 2 diabetes mellitus without complication Fasting glucose measurements are within target range with TLC alone.  Will repeat A1C when due in 2 months. Referral placed for Medical Nutrition Therapy.    Hyperlipidemia Will recheck TGL.  Patient will need to be on medication in addition to [facility] as he refused fibrates AMA and is intolerant to most statins.  Has tolerated Livalo previously.  Will make attempt to get medication approved through insurance.  Obesity, unspecified Phentermine discontinued due to side effect.  Will attempt more rigorous TLC.  Referral to Nutrition placed.  Encouraged using the pool at the gym for swimming and water aerobics to help cut fat while being easy on the joints.

## 2014-09-21 ENCOUNTER — Telehealth: Payer: Self-pay | Admitting: *Deleted

## 2014-09-21 DIAGNOSIS — E785 Hyperlipidemia, unspecified: Secondary | ICD-10-CM

## 2014-09-21 DIAGNOSIS — E781 Pure hyperglyceridemia: Secondary | ICD-10-CM

## 2014-09-21 MED ORDER — PITAVASTATIN CALCIUM 4 MG PO TABS: 4.0000 mg | ORAL_TABLET | Freq: Every day | ORAL | Status: DC

## 2014-09-21 NOTE — Telephone Encounter (Signed)
Received PA form from [facility] for [place], called PA phone number [phone]: [employer]]; received PA form, completed & awaiting provider signature and any further medical information/SLS

## 2014-09-21 NOTE — Telephone Encounter (Signed)
Rx for Livalo 4 mg sent to pharmacy.  Awaiting notice for prior authorization.

## 2014-09-21 NOTE — Telephone Encounter (Signed)
Patient informed, understood & agreed; please forward Rx to pharmacy/SLS

## 2014-09-21 NOTE — Telephone Encounter (Signed)
Message copied by Rockwell Germany on Mon Sep 21, 2014 11:29 AM ------      Message from: Raiford Noble      Created: Sun Sep 20, 2014  4:52 PM       TGL still elevated -- We need to make a re-attempt at [facility] as this is the only statin the patient can tolerate and he refuses to take a fibrate. ------

## 2014-09-21 NOTE — Telephone Encounter (Signed)
I have added extra information to PA form and gave to Vance to be faxed to insurance company

## 2014-09-24 NOTE — Telephone Encounter (Signed)
Received fax from [facility] stating that PA for Livalo has been denied. Letter states that patient must have therapeutic failure of a generic statin and Crestor in order to have PA approved. Please advise. JG//CMA

## 2014-09-25 NOTE — Telephone Encounter (Signed)
Let him know they denied and confirm he has not tried any other statins, if he has not should try Simvastatin 10 mg po daily Disp #30 with 3 rf if he is willing, if not he will need to wait for Primary provider to return

## 2014-09-25 NOTE — Telephone Encounter (Signed)
Forwarded to Murtis Sink, [place].

## 2014-09-28 NOTE — Telephone Encounter (Signed)
Called and l/m for pt to return call.   I need to know if patient has tried Simvastatin because insurance denied Livalo.  JG//CMA

## 2014-09-29 NOTE — Telephone Encounter (Signed)
I spoke with patient and he stated that he has tried Crestor, Lipitor and all other statin drugs and he is not able to tolerate any of them. He states they all cause muscle stiffness. I will try and appeal decision concerning Livalo. JG//CMA

## 2014-09-30 NOTE — Telephone Encounter (Signed)
Called and notified patient of Livalo approval. JG//CMA

## 2014-09-30 NOTE — Telephone Encounter (Signed)
Livalo was approved effective 09/21/2014-lifetime of insurance. JG//CMA

## 2014-10-06 ENCOUNTER — Telehealth: Payer: Self-pay | Admitting: Physician Assistant

## 2014-10-06 NOTE — Telephone Encounter (Signed)
Pt states that on his last visit, cody informed him that he would let him know when he wanted him to come back for a follow up appt. Pt states he has not heard anything yet and just following up.

## 2014-10-06 NOTE — Telephone Encounter (Signed)
2 months

## 2014-10-06 NOTE — Telephone Encounter (Signed)
Please Advise

## 2014-10-06 NOTE — Telephone Encounter (Signed)
Please call patient and schedule follow-up in [2] months per provider, please & thank you/SLS

## 2014-10-07 ENCOUNTER — Other Ambulatory Visit: Payer: Self-pay | Admitting: Physician Assistant

## 2014-10-07 NOTE — Telephone Encounter (Signed)
Appointment scheduled for 11/23/14

## 2014-10-27 ENCOUNTER — Ambulatory Visit: Payer: BC Managed Care – PPO | Attending: Physician Assistant

## 2014-10-27 DIAGNOSIS — M5417 Radiculopathy, lumbosacral region: Secondary | ICD-10-CM | POA: Insufficient documentation

## 2014-10-27 DIAGNOSIS — E780 Pure hypercholesterolemia: Secondary | ICD-10-CM | POA: Diagnosis not present

## 2014-10-27 DIAGNOSIS — Z5189 Encounter for other specified aftercare: Secondary | ICD-10-CM | POA: Insufficient documentation

## 2014-10-28 ENCOUNTER — Ambulatory Visit: Payer: BC Managed Care – PPO

## 2014-10-28 DIAGNOSIS — Z5189 Encounter for other specified aftercare: Secondary | ICD-10-CM | POA: Diagnosis not present

## 2014-11-03 ENCOUNTER — Ambulatory Visit: Payer: BC Managed Care – PPO | Admitting: Rehabilitation

## 2014-11-03 DIAGNOSIS — Z5189 Encounter for other specified aftercare: Secondary | ICD-10-CM | POA: Diagnosis not present

## 2014-11-04 ENCOUNTER — Ambulatory Visit: Payer: BC Managed Care – PPO

## 2014-11-04 DIAGNOSIS — Z5189 Encounter for other specified aftercare: Secondary | ICD-10-CM | POA: Diagnosis not present

## 2014-11-09 ENCOUNTER — Ambulatory Visit: Payer: BC Managed Care – PPO

## 2014-11-09 DIAGNOSIS — Z5189 Encounter for other specified aftercare: Secondary | ICD-10-CM | POA: Diagnosis not present

## 2014-11-11 ENCOUNTER — Ambulatory Visit: Payer: BC Managed Care – PPO | Admitting: Rehabilitation

## 2014-11-11 DIAGNOSIS — Z5189 Encounter for other specified aftercare: Secondary | ICD-10-CM | POA: Diagnosis not present

## 2014-11-16 ENCOUNTER — Ambulatory Visit: Payer: BC Managed Care – PPO | Admitting: Rehabilitation

## 2014-11-16 DIAGNOSIS — Z5189 Encounter for other specified aftercare: Secondary | ICD-10-CM | POA: Diagnosis not present

## 2014-11-18 ENCOUNTER — Ambulatory Visit: Payer: BC Managed Care – PPO | Admitting: Rehabilitation

## 2014-11-18 DIAGNOSIS — Z5189 Encounter for other specified aftercare: Secondary | ICD-10-CM | POA: Diagnosis not present

## 2014-11-23 ENCOUNTER — Ambulatory Visit (INDEPENDENT_AMBULATORY_CARE_PROVIDER_SITE_OTHER): Payer: BC Managed Care – PPO | Admitting: Physician Assistant

## 2014-11-23 ENCOUNTER — Encounter: Payer: Self-pay | Admitting: Physician Assistant

## 2014-11-23 ENCOUNTER — Ambulatory Visit: Payer: BC Managed Care – PPO | Admitting: Rehabilitation

## 2014-11-23 VITALS — BP 130/70 | HR 81 | Temp 98.4°F | Resp 16 | Ht 68.0 in | Wt 267.2 lb

## 2014-11-23 DIAGNOSIS — E119 Type 2 diabetes mellitus without complications: Secondary | ICD-10-CM

## 2014-11-23 DIAGNOSIS — E669 Obesity, unspecified: Secondary | ICD-10-CM

## 2014-11-23 DIAGNOSIS — E785 Hyperlipidemia, unspecified: Secondary | ICD-10-CM | POA: Diagnosis not present

## 2014-11-23 DIAGNOSIS — Z6841 Body Mass Index (BMI) 40.0 and over, adult: Secondary | ICD-10-CM

## 2014-11-23 LAB — HEPATIC FUNCTION PANEL
ALK PHOS: 43 U/L (ref 39–117)
ALT: 26 U/L (ref 0–53)
AST: 24 U/L (ref 0–37)
Albumin: 3.7 g/dL (ref 3.5–5.2)
Bilirubin, Direct: 0.1 mg/dL (ref 0.0–0.3)
Total Bilirubin: 1.2 mg/dL (ref 0.2–1.2)
Total Protein: 6.7 g/dL (ref 6.0–8.3)

## 2014-11-23 MED ORDER — OMEGA-3 FISH OIL 1000 MG PO CAPS: 2.0000 | ORAL_CAPSULE | Freq: Every day | ORAL | Status: DC

## 2014-11-23 MED ORDER — LORCASERIN HCL 10 MG PO TABS: 10.0000 mg | ORAL_TABLET | Freq: Two times a day (BID) | ORAL | Status: DC

## 2014-11-23 NOTE — Assessment & Plan Note (Signed)
Doing well overall.  Continue current measures.  Continue diary for fasting blood sugars.  Will check repeat A1C at follow-up in 3 months.

## 2014-11-23 NOTE — Assessment & Plan Note (Signed)
Will attempt to initiate Belviq for weight loss giving adverse reactions to Phentermine.  Continue diet and exercise.

## 2014-11-23 NOTE — Patient Instructions (Addendum)
Please continue medications as directed.  Stop by the lab today for blood work to reassess your triglycerides.  I will call you with your results.  If everything looks good, we will continue current doses and follow-up in 3 months.  If still abnormal, we will alter your therapy.    I will work on getting the Belviq approved for weight loss.  I will call you once this is complete.  Food Choices to Lower Your Triglycerides  Triglycerides are a type of fat in your blood. High levels of triglycerides can increase the risk of heart disease and stroke. If your triglyceride levels are high, the foods you eat and your eating habits are very important. Choosing the right foods can help lower your triglycerides.  WHAT GENERAL GUIDELINES DO I NEED TO FOLLOW?  Lose weight if you are overweight.   Limit or avoid alcohol.   Fill one half of your plate with vegetables and green salads.   Limit fruit to two servings a day. Choose fruit instead of juice.   Make one fourth of your plate whole grains. Look for the word "whole" as the first word in the ingredient list.  Fill one fourth of your plate with lean protein foods.  Enjoy fatty fish (such as salmon, mackerel, sardines, and tuna) three times a week.   Choose healthy fats.   Limit foods high in starch and sugar.  Eat more home-cooked food and less restaurant, buffet, and fast food.  Limit fried foods.  Cook foods using methods other than frying.  Limit saturated fats.  Check ingredient lists to avoid foods with partially hydrogenated oils (trans fats) in them. WHAT FOODS CAN I EAT?  Grains Whole grains, such as whole wheat or whole grain breads, crackers, cereals, and pasta. Unsweetened oatmeal, bulgur, barley, quinoa, or brown rice. Corn or whole wheat flour tortillas.  Vegetables Fresh or frozen vegetables (raw, steamed, roasted, or grilled). Green salads. Fruits All fresh, canned (in natural juice), or frozen fruits. Meat and  Other Protein Products Ground beef (85% or leaner), grass-fed beef, or beef trimmed of fat. Skinless chicken or [place]. Ground chicken or [place]. Pork trimmed of fat. All fish and seafood. Eggs. Dried beans, peas, or lentils. Unsalted nuts or seeds. Unsalted canned or dry beans. Dairy Low-fat dairy products, such as skim or 1% milk, 2% or reduced-fat cheeses, low-fat ricotta or cottage cheese, or plain low-fat yogurt. Fats and Oils Tub margarines without trans fats. Light or reduced-fat mayonnaise and salad dressings. Avocado. Safflower, olive, or canola oils. Natural peanut or almond butter. The items listed above may not be a complete list of recommended foods or beverages. Contact your dietitian for more options. WHAT FOODS ARE NOT RECOMMENDED?  Grains White bread. White pasta. White rice. Cornbread. Bagels, pastries, and croissants. Crackers that contain trans fat. Vegetables White potatoes. Corn. Creamed or fried vegetables. Vegetables in a cheese sauce. Fruits Dried fruits. Canned fruit in light or heavy syrup. Fruit juice. Meat and Other Protein Products Fatty cuts of meat. Ribs, chicken wings, bacon, sausage, bologna, salami, chitterlings, fatback, hot dogs, bratwurst, and packaged luncheon meats. Dairy Whole or 2% milk, cream, half-and-half, and cream cheese. Whole-fat or sweetened yogurt. Full-fat cheeses. Nondairy creamers and whipped toppings. Processed cheese, cheese spreads, or cheese curds. Sweets and Desserts Corn syrup, sugars, honey, and molasses. Candy. Jam and jelly. Syrup. Sweetened cereals. Cookies, pies, cakes, donuts, muffins, and ice cream. Fats and Oils Butter, stick margarine, lard, shortening, ghee, or bacon fat. Coconut, palm  kernel, or palm oils. Beverages Alcohol. Sweetened drinks (such as sodas, lemonade, and fruit drinks or punches). The items listed above may not be a complete list of foods and beverages to avoid. Contact your dietitian for more  information. Document Released: 09/28/2004 Document Revised: 12/16/2013 Document Reviewed: 10/15/2013 [facility] Patient Information 2015 [place], [place]. This information is not intended to replace advice given to you by your health care provider. Make sure you discuss any questions you have with your health care provider.

## 2014-11-23 NOTE — Assessment & Plan Note (Signed)
Will obtain repeat fasting lipid panel today along with liver function testing.  Continue Welchol, Omega 3s and Livalo at present.  Will alter therapy based on results.

## 2014-11-23 NOTE — Progress Notes (Signed)
Pre visit review using our clinic review tool, if applicable. No additional management support is needed unless otherwise documented below in the visit note/SLS  

## 2014-11-23 NOTE — Progress Notes (Signed)
Patient presents to clinic today for 2 month follow-up of Diabetes Mellitus II, Hyperlipidemia, Hypertriglyceridemia and Obesity.   Patient with TGL elevated to 539 at last check on 09/18/14.  Patient restarted on Livalo after approved by insurance and instructed to continue [place].  Patient has been taking medications as directed.  Denies abdominal pain, nausea or vomiting.  For diabetes, patient continues to take his Metformin as directed.  Endorses fasting sugars averaging ~ 100-130. Last A1C at 6.9, showing sugars are well controlled. Continue to follow nutritionist's instructions.  For obesity, patient previously on Phentermine but experienced insomnia and racing heart.  Would like to try another Rx medication to promote weight loss.  Past Medical History  Diagnosis Date  . Internal bleeding hemorrhoids 2012    "might bleed once/month; only when I strain"  . Hyperlipidemia   . Clostridium difficile infection   . Testicular hypofunction   . Biceps tendon rupture     bilateral  . GERD (gastroesophageal reflux disease)   . Colon polyps   . Allergy   . Arthritis   . Hypertension   . Diabetes     Borderline/Pre-diabetes  . Leg cramps     Current Outpatient Prescriptions on File Prior to Visit  Medication Sig Dispense Refill  . Blood Glucose Monitoring Suppl (RELION CONFIRM GLUCOSE MONITOR) W/DEVICE KIT USE AS DIRECTED TO TEST BLOOD GLUCOSE DAILY DX: 250.00 1 kit 0  . Cholecalciferol (VITAMIN D-3) 5000 UNITS TABS Take 1 tablet by mouth daily.    . clomiPHENE (CLOMID) 50 MG tablet Take 25 mg by mouth daily.    . cyclobenzaprine (FLEXERIL) 10 MG tablet Take 10 mg by mouth 3 (three) times daily as needed for muscle spasms.    Marland Kitchen docusate sodium (COLACE) 100 MG capsule Take 100 mg by mouth daily.     Marland Kitchen glucose blood test strip RELION BLOOD GLUCOSE TEST STRIP Use as instructed to test daily Dx: 250.00 50 each 11  . lisinopril-hydrochlorothiazide (PRINZIDE,ZESTORETIC) 20-12.5 MG per tablet  Take 1 tablet by mouth 2 (two) times daily. Takes 1 tablet QAM & 0.5 tablet QHs    . metFORMIN (GLUCOPHAGE-XR) 500 MG 24 hr tablet Take 1 tablet (500 mg total) by mouth at bedtime. 60 tablet 5  . omeprazole (PRILOSEC) 20 MG capsule Take 1 capsule (20 mg total) by mouth daily. 90 capsule 3  . Pitavastatin Calcium (LIVALO) 4 MG TABS Take 1 tablet (4 mg total) by mouth daily. 30 tablet 3  . triamcinolone cream (KENALOG) 0.5 % Apply 1 application topically 2 (two) times daily as needed. To affected areas.    . WELCHOL 625 MG tablet TAKE 2 TABLETS BY MOUTH TWICE A DAY WITH A MEAL - DO NOT TAKE AT SAME TIME AS OTHER MEDICATIONS 120 tablet 2  . Wheat Dextrin (EQ FIBER POWDER PO) Take 2 each by mouth 3 (three) times daily.      No current facility-administered medications on file prior to visit.    Allergies  Allergen Reactions  . Asa [Aspirin] Nausea Only and Rash  . Penicillins Rash  . Pork-Derived Products Diarrhea and Nausea And Vomiting    PER PATIENT REQUEST DELETE, ALLERGIST RESOLVED  . Tetanus Toxoids Anaphylaxis  . Daptomycin Other (See Comments)    myalgias  . Prednisone (Pak) [Prednisone] Other (See Comments)    Hyperactivity  . Fenofibrate Palpitations    Constipation w/anal fissure [Sx]    Family History  Problem Relation Age of Onset  . Heart disease Mother 60  Deceased  . Hypertension Mother   . Hyperlipidemia Mother   . Heart disease Father 62    Deceased  . CVA Father   . Hypertension Maternal Grandmother   . Hyperlipidemia Maternal Grandmother   . Cancer Maternal Grandmother     Colon Cancer  . Heart disease Maternal Grandfather   . Hypertension Maternal Grandfather   . Hyperlipidemia Maternal Grandfather   . Cancer Maternal Grandfather     Lung Cancer  . Asthma Paternal Grandmother   . Heart disease Paternal Grandfather     History   Social History  . Marital Status: Married    Spouse Name: N/A    Number of Children: N/A  . Years of Education: N/A    Social History Main Topics  . Smoking status: Never Smoker   . Smokeless tobacco: Never Used  . Alcohol Use: Yes     Comment: 08/21/2012 "might drink a beer once a year"  . Drug Use: No  . Sexual Activity: Not Currently   Other Topics Concern  . None   Social History Narrative   Marital Status: Married Pamala Hurry)    Children:  Step Daughter Sharyn Lull)    Pets: None    Living Situation: Lives with Pamala Hurry    Occupation: [employer] ([place]    Education: Associate's Degree in [employer]    Tobacco Use/Exposure:  None    Alcohol Use:  Rarely    Drug Use:  None   Diet:  Regular   Exercise: Limited     Hobbies:  Hunting, Fishing             Review of Systems - See HPI.  All other ROS are negative.  BP 130/70 mmHg  Pulse 81  Temp(Src) 98.4 F (36.9 C) (Oral)  Resp 16  Ht _0  (1.727 m)  Wt 267 lb 4 oz (121.224 kg)  BMI 40.64 kg/m2  SpO2 99%  Physical Exam  Constitutional: He is oriented to person, place, and time and well-developed, well-nourished, and in no distress.  HENT:  Head: Normocephalic and atraumatic.  Right Ear: External ear normal.  Left Ear: External ear normal.  Nose: Nose normal.  Mouth/Throat: Oropharynx is clear and moist. No oropharyngeal exudate.  TM within normal limits bilaterally.  Eyes: Conjunctivae are normal. Pupils are equal, round, and reactive to light.  Neck: Neck supple.  Cardiovascular: Normal rate, regular rhythm, normal heart sounds and intact distal pulses.   Pulmonary/Chest: Effort normal and breath sounds normal. No respiratory distress. He has no wheezes. He has no rales. He exhibits no tenderness.  Lymphadenopathy:    He has no cervical adenopathy.  Neurological: He is alert and oriented to person, place, and time.  Skin: Skin is warm and dry. No rash noted.  Psychiatric: Affect normal.  Vitals reviewed.  Recent Results (from the past 2160 hour(s))  Lipid Panel w/Direct LDL:HDL Ratio     Status: Abnormal    Collection Time: 09/18/14  4:49 PM  Result Value Ref Range   Cholesterol 186 0 - 200 mg/dL   Triglycerides 539 (H) <150 mg/dL   HDL 31 (L) >39 mg/dL   Total Chol/HDL Ratio 6.0 Ratio   Direct LDL 39 mg/dL    Comment: ATP III Classification (LDL):       < 100        mg/dL         Optimal      100 - 129     mg/dL  Near or Above Optimal      130 - 159     mg/dL         Borderline High      160 - 189     mg/dL         High       > 190        mg/dL         Very High     LDL:HDL Ratio 1.3 Ratio    Comment:                        LDL/HDL Ratio Table                                        Men       Women          1/2 Average Risk              1.0        1.5              Average Risk              3.6        3.2          2 X Average Risk              6.3        5.0          3 X Average Risk              8.0        6.1   [Kannel WB, Castelli WP, and Darcus [facility] Int Med, 1979, 90:85]      Assessment/Plan: Hyperlipidemia Will obtain repeat fasting lipid panel today along with liver function testing.  Continue Welchol, Omega 3s and Livalo at present.  Will alter therapy based on results.  Obesity Will attempt to initiate Belviq for weight loss giving adverse reactions to Phentermine.  Continue diet and exercise.  Type 2 diabetes mellitus without complication Doing well overall.  Continue current measures.  Continue diary for fasting blood sugars.  Will check repeat A1C at follow-up in 3 months.

## 2014-11-24 ENCOUNTER — Other Ambulatory Visit: Payer: Self-pay | Admitting: *Deleted

## 2014-11-24 LAB — LIPID PANEL
CHOLESTEROL: 304 mg/dL — AB (ref 0–200)
HDL: 26.9 mg/dL — AB (ref >39.00)
NonHDL: 277.1
Total CHOL/HDL Ratio: 11
Triglycerides: 1100 mg/dL — ABNORMAL HIGH (ref 0.0–149.0)
VLDL: 220 mg/dL — AB (ref 0.0–40.0)

## 2014-11-24 LAB — LDL CHOLESTEROL, DIRECT: Direct LDL: 53 mg/dL

## 2014-11-24 MED ORDER — OMEGA-3-ACID ETHYL ESTERS 1 G PO CAPS: 2.0000 g | ORAL_CAPSULE | Freq: Two times a day (BID) | ORAL | Status: DC

## 2014-11-25 ENCOUNTER — Other Ambulatory Visit: Payer: Self-pay | Admitting: Specialist

## 2014-11-25 ENCOUNTER — Telehealth: Payer: Self-pay | Admitting: Physician Assistant

## 2014-11-25 ENCOUNTER — Ambulatory Visit: Payer: BC Managed Care – PPO | Attending: Physician Assistant | Admitting: Rehabilitation

## 2014-11-25 DIAGNOSIS — M48061 Spinal stenosis, lumbar region without neurogenic claudication: Secondary | ICD-10-CM

## 2014-11-25 DIAGNOSIS — M5417 Radiculopathy, lumbosacral region: Secondary | ICD-10-CM | POA: Diagnosis not present

## 2014-11-25 DIAGNOSIS — E780 Pure hypercholesterolemia: Secondary | ICD-10-CM | POA: Insufficient documentation

## 2014-11-25 DIAGNOSIS — Z5189 Encounter for other specified aftercare: Secondary | ICD-10-CM | POA: Diagnosis present

## 2014-11-25 DIAGNOSIS — E781 Pure hyperglyceridemia: Secondary | ICD-10-CM

## 2014-11-25 MED ORDER — FENOFIBRATE MICRONIZED 134 MG PO CAPS
134.0000 mg | ORAL_CAPSULE | Freq: Every day | ORAL | Status: DC
Start: 2014-11-25 — End: 2015-01-21

## 2014-11-25 NOTE — Telephone Encounter (Signed)
Discussed results.  Patient has finally agreed to attempt new fibrate for TGL.  Rx Lofibra 134 mcg. Take daily with Livalo.  Stop Welchol.  REturn to lab in 1 month.

## 2014-11-27 ENCOUNTER — Other Ambulatory Visit: Payer: Self-pay | Admitting: Specialist

## 2014-11-27 ENCOUNTER — Ambulatory Visit
Admission: RE | Admit: 2014-11-27 | Discharge: 2014-11-27 | Disposition: A | Discharge status: Home / Self Care | Payer: BC Managed Care – PPO | Source: Ambulatory Visit | Attending: Specialist | Admitting: Specialist

## 2014-11-27 DIAGNOSIS — M48061 Spinal stenosis, lumbar region without neurogenic claudication: Secondary | ICD-10-CM

## 2014-11-27 MED ORDER — MEPERIDINE HCL 100 MG/ML IJ SOLN
75.0000 mg | Freq: Once | INTRAMUSCULAR | Status: AC
  Administered 2014-11-27: 75 mg via INTRAMUSCULAR

## 2014-11-27 MED ORDER — ONDANSETRON HCL 4 MG/2ML IJ SOLN
4.0000 mg | Freq: Once | INTRAMUSCULAR | Status: AC
  Administered 2014-11-27: 4 mg via INTRAMUSCULAR

## 2014-11-27 MED ORDER — DIAZEPAM 5 MG PO TABS: 10.0000 mg | ORAL_TABLET | Freq: Once | ORAL | Status: DC

## 2014-11-27 MED ORDER — IOHEXOL 180 MG/ML  SOLN
15.0000 mL | Freq: Once | INTRAMUSCULAR | Status: AC | PRN
  Administered 2014-11-27: 15 mL via INTRATHECAL

## 2014-11-27 NOTE — Discharge Instructions (Signed)

## 2014-11-30 ENCOUNTER — Ambulatory Visit: Payer: BC Managed Care – PPO | Admitting: Rehabilitation

## 2014-11-30 DIAGNOSIS — Z5189 Encounter for other specified aftercare: Secondary | ICD-10-CM | POA: Diagnosis not present

## 2014-12-02 ENCOUNTER — Ambulatory Visit: Payer: BC Managed Care – PPO | Admitting: Rehabilitation

## 2014-12-02 DIAGNOSIS — Z5189 Encounter for other specified aftercare: Secondary | ICD-10-CM | POA: Diagnosis not present

## 2014-12-06 ENCOUNTER — Other Ambulatory Visit: Payer: Self-pay | Admitting: Physician Assistant

## 2014-12-07 ENCOUNTER — Ambulatory Visit: Payer: BC Managed Care – PPO | Admitting: Rehabilitation

## 2014-12-07 DIAGNOSIS — Z5189 Encounter for other specified aftercare: Secondary | ICD-10-CM | POA: Diagnosis not present

## 2014-12-07 NOTE — Telephone Encounter (Signed)
Rx request to pharmacy/SLS  

## 2014-12-09 ENCOUNTER — Ambulatory Visit: Payer: BC Managed Care – PPO | Admitting: Rehabilitation

## 2014-12-09 DIAGNOSIS — Z5189 Encounter for other specified aftercare: Secondary | ICD-10-CM | POA: Diagnosis not present

## 2014-12-14 ENCOUNTER — Ambulatory Visit: Payer: BC Managed Care – PPO | Admitting: Rehabilitation

## 2014-12-14 DIAGNOSIS — Z5189 Encounter for other specified aftercare: Secondary | ICD-10-CM | POA: Diagnosis not present

## 2014-12-16 ENCOUNTER — Ambulatory Visit: Payer: BC Managed Care – PPO | Admitting: Rehabilitation

## 2014-12-16 DIAGNOSIS — Z5189 Encounter for other specified aftercare: Secondary | ICD-10-CM | POA: Diagnosis not present

## 2014-12-21 ENCOUNTER — Ambulatory Visit: Payer: BC Managed Care – PPO | Admitting: Rehabilitation

## 2014-12-21 DIAGNOSIS — Z5189 Encounter for other specified aftercare: Secondary | ICD-10-CM | POA: Diagnosis not present

## 2014-12-22 ENCOUNTER — Encounter: Payer: Self-pay | Admitting: Physician Assistant

## 2014-12-22 ENCOUNTER — Ambulatory Visit (INDEPENDENT_AMBULATORY_CARE_PROVIDER_SITE_OTHER): Payer: BC Managed Care – PPO | Admitting: Physician Assistant

## 2014-12-22 ENCOUNTER — Telehealth: Payer: Self-pay | Admitting: Physician Assistant

## 2014-12-22 VITALS — BP 142/89 | HR 71 | Temp 98.1°F | Resp 16 | Ht 68.0 in | Wt 261.1 lb

## 2014-12-22 DIAGNOSIS — E669 Obesity, unspecified: Secondary | ICD-10-CM

## 2014-12-22 DIAGNOSIS — E781 Pure hyperglyceridemia: Secondary | ICD-10-CM

## 2014-12-22 LAB — LIPID PANEL
Cholesterol: 167 mg/dL (ref 0–200)
HDL: 34.5 mg/dL — ABNORMAL LOW (ref >39.00)
LDL Cholesterol: 103 mg/dL — ABNORMAL HIGH (ref 0–99)
NonHDL: 132.5
Total CHOL/HDL Ratio: 5
Triglycerides: 150 mg/dL — ABNORMAL HIGH (ref 0.0–149.0)
VLDL: 30 mg/dL (ref 0.0–40.0)

## 2014-12-22 NOTE — Assessment & Plan Note (Signed)
Body mass index is 39.71 kg/(m^2). Patient just started Belviq.  Will continue the same. Patient is finally agreeable to nutrition therapy.  Referral placed.  Patient will be contacted to schedule.

## 2014-12-22 NOTE — Telephone Encounter (Signed)
Called patient with lab results and instructions -- Will begin Lofibra QOD dosing to help mitigate constipation.  Will reassess at follow-up.

## 2014-12-22 NOTE — Patient Instructions (Signed)
Please continue medications as directed.  Start your Gooseberry supplement as it should not harm you and will hopefully be beneficial.  You will be contacted by Nutrition for a consultation.  If your Triglycerides remain elevated, we will refer you to the Lipid Specialist for further management giving you are intolerant of multiple oral medications.  Food Choices to Lower Your Triglycerides  Triglycerides are a type of fat in your blood. High levels of triglycerides can increase the risk of heart disease and stroke. If your triglyceride levels are high, the foods you eat and your eating habits are very important. Choosing the right foods can help lower your triglycerides.  WHAT GENERAL GUIDELINES DO I NEED TO FOLLOW?  Lose weight if you are overweight.   Limit or avoid alcohol.   Fill one half of your plate with vegetables and green salads.   Limit fruit to two servings a day. Choose fruit instead of juice.   Make one fourth of your plate whole grains. Look for the word "whole" as the first word in the ingredient list.  Fill one fourth of your plate with lean protein foods.  Enjoy fatty fish (such as salmon, mackerel, sardines, and tuna) three times a week.   Choose healthy fats.   Limit foods high in starch and sugar.  Eat more home-cooked food and less restaurant, buffet, and fast food.  Limit fried foods.  Cook foods using methods other than frying.  Limit saturated fats.  Check ingredient lists to avoid foods with partially hydrogenated oils (trans fats) in them. WHAT FOODS CAN I EAT?  Grains Whole grains, such as whole wheat or whole grain breads, crackers, cereals, and pasta. Unsweetened oatmeal, bulgur, barley, quinoa, or brown rice. Corn or whole wheat flour tortillas.  Vegetables Fresh or frozen vegetables (raw, steamed, roasted, or grilled). Green salads. Fruits All fresh, canned (in natural juice), or frozen fruits. Meat and Other Protein Products Ground beef  (85% or leaner), grass-fed beef, or beef trimmed of fat. Skinless chicken or [place]. Ground chicken or [place]. Pork trimmed of fat. All fish and seafood. Eggs. Dried beans, peas, or lentils. Unsalted nuts or seeds. Unsalted canned or dry beans. Dairy Low-fat dairy products, such as skim or 1% milk, 2% or reduced-fat cheeses, low-fat ricotta or cottage cheese, or plain low-fat yogurt. Fats and Oils Tub margarines without trans fats. Light or reduced-fat mayonnaise and salad dressings. Avocado. Safflower, olive, or canola oils. Natural peanut or almond butter. The items listed above may not be a complete list of recommended foods or beverages. Contact your dietitian for more options. WHAT FOODS ARE NOT RECOMMENDED?  Grains White bread. White pasta. White rice. Cornbread. Bagels, pastries, and croissants. Crackers that contain trans fat. Vegetables White potatoes. Corn. Creamed or fried vegetables. Vegetables in a cheese sauce. Fruits Dried fruits. Canned fruit in light or heavy syrup. Fruit juice. Meat and Other Protein Products Fatty cuts of meat. Ribs, chicken wings, bacon, sausage, bologna, salami, chitterlings, fatback, hot dogs, bratwurst, and packaged luncheon meats. Dairy Whole or 2% milk, cream, half-and-half, and cream cheese. Whole-fat or sweetened yogurt. Full-fat cheeses. Nondairy creamers and whipped toppings. Processed cheese, cheese spreads, or cheese curds. Sweets and Desserts Corn syrup, sugars, honey, and molasses. Candy. Jam and jelly. Syrup. Sweetened cereals. Cookies, pies, cakes, donuts, muffins, and ice cream. Fats and Oils Butter, stick margarine, lard, shortening, ghee, or bacon fat. Coconut, palm kernel, or palm oils. Beverages Alcohol. Sweetened drinks (such as sodas, lemonade, and fruit drinks or punches).  The items listed above may not be a complete list of foods and beverages to avoid. Contact your dietitian for more information. Document Released: 09/28/2004  Document Revised: 12/16/2013 Document Reviewed: 10/15/2013 [facility] Patient Information 2015 [place], [place]. This information is not intended to replace advice given to you by your health care provider. Make sure you discuss any questions you have with your health care provider.

## 2014-12-22 NOTE — Progress Notes (Signed)
Patient presents to clinic today for follow-up of high triglycerides.  Patient's triglycerides remained elevated despite initial treatment with Lipitor and Welchol. Had to be switched to Livalo due to extensive myalgias.  Patient had previously refused to go back on a fibrate but at last visit, decided to do so once the risks of high triglycerides were reiterated.  Patient endorses taking the [place] as directed.  Endorses some mild constipation relieved with good diet and fluid intake.  BP mildly elevated today secondary to mild anxiety and moderate back pain.  Patient is followed by Neurosurgery and has appointment today to address.  Past Medical History  Diagnosis Date  . Internal bleeding hemorrhoids 2012    "might bleed once/month; only when I strain"  . Hyperlipidemia   . Clostridium difficile infection   . Testicular hypofunction   . Biceps tendon rupture     bilateral  . GERD (gastroesophageal reflux disease)   . Colon polyps   . Allergy   . Arthritis   . Hypertension   . Diabetes     Borderline/Pre-diabetes  . Leg cramps     Current Outpatient Prescriptions on File Prior to Visit  Medication Sig Dispense Refill  . ALLERGIST [name] See admin instructions.  6  . Blood Glucose Monitoring Suppl (RELION CONFIRM GLUCOSE MONITOR) W/DEVICE KIT USE AS DIRECTED TO TEST BLOOD GLUCOSE DAILY DX: 250.00 1 kit 0  . Cholecalciferol (VITAMIN D-3) 5000 UNITS TABS Take 1 tablet by mouth daily.    . clomiPHENE (CLOMID) 50 MG tablet Take 25 mg by mouth daily.    . cyclobenzaprine (FLEXERIL) 10 MG tablet Take 10 mg by mouth 3 (three) times daily as needed for muscle spasms.    Marland Kitchen docusate sodium (COLACE) 100 MG capsule Take 100 mg by mouth daily.     . fenofibrate micronized (LOFIBRA) 134 MG capsule Take 1 capsule (134 mg total) by mouth daily before breakfast. 30 capsule 1  . glucose blood test strip RELION BLOOD GLUCOSE TEST STRIP Use as  instructed to test daily Dx: 250.00 50 each 11  . HYDROcodone-acetaminophen (NORCO) 10-325 MG per tablet Take 1 tablet by mouth 4 (four) times daily as needed.    Marland Kitchen lisinopril-hydrochlorothiazide (PRINZIDE,ZESTORETIC) 20-12.5 MG per tablet TAKE 1 TABLET BY MOUTH 2 (TWO) TIMES DAILY. 180 tablet 1  . omega-3 acid ethyl esters (LOVAZA) 1 G capsule Take 2 capsules (2 g total) by mouth 2 (two) times daily. 120 capsule 1  . omeprazole (PRILOSEC) 20 MG capsule Take 1 capsule (20 mg total) by mouth daily. 90 capsule 3  . Pitavastatin Calcium (LIVALO) 4 MG TABS Take 1 tablet (4 mg total) by mouth daily. 30 tablet 3  . triamcinolone cream (KENALOG) 0.5 % Apply 1 application topically 2 (two) times daily as needed. To affected areas.    . Wheat Dextrin (EQ FIBER POWDER PO) Take 2 each by mouth 3 (three) times daily.      No current facility-administered medications on file prior to visit.    Allergies  Allergen Reactions  . Tetanus Toxoids Anaphylaxis  . Daptomycin Other (See Comments)    myalgias  . Asa [Aspirin] Nausea Only and Rash  . Fenofibrate Other (See Comments)    Constipation w/anal fissure [Sx]  . Penicillins Rash  . Prednisone (Pak) [Prednisone] Other (See Comments)    Hyperactivity    Family History  Problem Relation Age of Onset  . Heart disease Mother 9  Deceased  . Hypertension Mother   . Hyperlipidemia Mother   . Heart disease Father 47    Deceased  . CVA Father   . Hypertension Maternal Grandmother   . Hyperlipidemia Maternal Grandmother   . Cancer Maternal Grandmother     Colon Cancer  . Heart disease Maternal Grandfather   . Hypertension Maternal Grandfather   . Hyperlipidemia Maternal Grandfather   . Cancer Maternal Grandfather     Lung Cancer  . Asthma Paternal Grandmother   . Heart disease Paternal Grandfather     History   Social History  . Marital Status: Married    Spouse Name: N/A    Number of Children: N/A  . Years of Education: N/A   Social  History Main Topics  . Smoking status: Never Smoker   . Smokeless tobacco: Never Used  . Alcohol Use: Yes     Comment: 08/21/2012 "might drink a beer once a year"  . Drug Use: No  . Sexual Activity: Not Currently   Other Topics Concern  . None   Social History Narrative   Marital Status: Married Pamala Hurry)    Children:  Step Daughter Sharyn Lull)    Pets: None    Living Situation: Lives with Pamala Hurry    Occupation: [employer] ([place]    Education: Associate's Degree in [employer]    Tobacco Use/Exposure:  None    Alcohol Use:  Rarely    Drug Use:  None   Diet:  Regular   Exercise: Limited     Hobbies:  Hunting, Fishing             Review of Systems - See HPI.  All other ROS are negative.  BP 142/89 mmHg  Pulse 71  Temp(Src) 98.1 F (36.7 C) (Oral)  Resp 16  Ht 5' 8" (1.727 m)  Wt 261 lb 2 oz (118.446 kg)  BMI 39.71 kg/m2  SpO2 98%  Physical Exam  Constitutional: He is oriented to person, place, and time and well-developed, well-nourished, and in no distress.  HENT:  Head: Normocephalic and atraumatic.  Neck: Neck supple.  Cardiovascular: Normal rate, regular rhythm, normal heart sounds and intact distal pulses.   Pulmonary/Chest: Effort normal and breath sounds normal. No respiratory distress. He has no wheezes. He has no rales. He exhibits no tenderness.  Neurological: He is alert and oriented to person, place, and time.  Skin: Skin is warm and dry. No rash noted.  Psychiatric: Affect normal.  Vitals reviewed.   Recent Results (from the past 2160 hour(s))  Lipid Profile     Status: Abnormal   Collection Time: 11/23/14 11:02 AM  Result Value Ref Range   Cholesterol 304 (H) 0 - 200 mg/dL    Comment: ATP III Classification       Desirable:  < 200 mg/dL               Borderline High:  200 - 239 mg/dL          High:  > = 240 mg/dL   Triglycerides 1100.0 (H) 0.0 - 149.0 mg/dL    Comment: Normal:  <150 mg/dLBorderline High:  150 - 199 mg/dLTriglyceride  is over 400; calculations on Lipids are invalid.   HDL 26.90 (L) >39.00 mg/dL   VLDL 220.0 (H) 0.0 - 40.0 mg/dL   Total CHOL/HDL Ratio 11     Comment:                Men  Women1/2 Average Risk     3.4          3.3Average Risk          5.0          4.42X Average Risk          9.6          7.13X Average Risk          15.0          11.0                       NonHDL 277.10     Comment: NOTE:  Non-HDL goal should be 30 mg/dL higher than patient's LDL goal (i.e. LDL goal of < 70 mg/dL, would have non-HDL goal of < 100 mg/dL)  Hepatic function panel     Status: None   Collection Time: 11/23/14 11:02 AM  Result Value Ref Range   Total Bilirubin 1.2 0.2 - 1.2 mg/dL   Bilirubin, Direct 0.1 0.0 - 0.3 mg/dL   Alkaline Phosphatase 43 39 - 117 U/L   AST 24 0 - 37 U/L   ALT 26 0 - 53 U/L   Total Protein 6.7 6.0 - 8.3 g/dL   Albumin 3.7 3.5 - 5.2 g/dL  LDL cholesterol, direct     Status: None   Collection Time: 11/23/14 11:02 AM  Result Value Ref Range   Direct LDL 53.0 mg/dL    Comment: Optimal:  <100 mg/dLNear or Above Optimal:  100-129 mg/dLBorderline High:  130-159 mg/dLHigh:  160-189 mg/dLVery High:  >190 mg/dL    Assessment/Plan: Hypertriglyceridemia Will repeat lipid panel today to reassess.  If still elevated, will need to refer to Lipid Clinic as patient is intolerant of multiple medication and has failed several other treatment regimens.   Obesity Body mass index is 39.71 kg/(m^2). Patient just started Belviq.  Will continue the same. Patient is finally agreeable to nutrition therapy.  Referral placed.  Patient will be contacted to schedule.

## 2014-12-22 NOTE — Assessment & Plan Note (Signed)
Will repeat lipid panel today to reassess.  If still elevated, will need to refer to Lipid Clinic as patient is intolerant of multiple medication and has failed several other treatment regimens.

## 2014-12-22 NOTE — Progress Notes (Signed)
Pre visit review using our clinic review tool, if applicable. No additional management support is needed unless otherwise documented below in the visit note/SLS  

## 2014-12-23 ENCOUNTER — Ambulatory Visit: Payer: BC Managed Care – PPO | Admitting: Rehabilitation

## 2014-12-23 DIAGNOSIS — Z5189 Encounter for other specified aftercare: Secondary | ICD-10-CM | POA: Diagnosis not present

## 2014-12-24 ENCOUNTER — Telehealth: Payer: Self-pay | Admitting: *Deleted

## 2014-12-24 NOTE — Telephone Encounter (Signed)
Pt dropped off surgery clearance form. Forwarded to [place]. JG//CMA

## 2014-12-28 ENCOUNTER — Telehealth: Payer: Self-pay | Admitting: Physician Assistant

## 2014-12-28 NOTE — Telephone Encounter (Signed)
I see that patient has dropped of forms for surgical clearance.  He will need an appointment so that we can perform a detailed exam, obtain and EKG and blood work required for clearance.  Please call patient to schedule.

## 2014-12-29 NOTE — Telephone Encounter (Signed)
Pt scheduled surgical clearance appointment for 30 minutes for  01/01/2015.

## 2015-01-01 ENCOUNTER — Ambulatory Visit (INDEPENDENT_AMBULATORY_CARE_PROVIDER_SITE_OTHER): Payer: BLUE CROSS/BLUE SHIELD | Admitting: Physician Assistant

## 2015-01-01 VITALS — BP 136/80 | HR 67 | Temp 98.1°F | Resp 16 | Ht 68.0 in | Wt 265.4 lb

## 2015-01-01 DIAGNOSIS — E119 Type 2 diabetes mellitus without complications: Secondary | ICD-10-CM

## 2015-01-01 DIAGNOSIS — Z136 Encounter for screening for cardiovascular disorders: Secondary | ICD-10-CM

## 2015-01-01 DIAGNOSIS — Z23 Encounter for immunization: Secondary | ICD-10-CM

## 2015-01-01 LAB — PROTIME-INR
INR: 1 ratio (ref 0.8–1.0)
Prothrombin Time: 10.9 s (ref 9.6–13.1)

## 2015-01-01 LAB — BASIC METABOLIC PANEL
BUN: 21 mg/dL (ref 6–23)
CO2: 29 mEq/L (ref 19–32)
Calcium: 9.2 mg/dL (ref 8.4–10.5)
Chloride: 103 mEq/L (ref 96–112)
Creatinine, Ser: 1.3 mg/dL (ref 0.4–1.5)
GFR: 63.66 mL/min (ref >60.00)
Glucose, Bld: 103 mg/dL — ABNORMAL HIGH (ref 70–99)
Potassium: 4.2 mEq/L (ref 3.5–5.1)
Sodium: 139 mEq/L (ref 135–145)

## 2015-01-01 LAB — CBC
HCT: 47.5 % (ref 39.0–52.0)
HEMOGLOBIN: 16.2 g/dL (ref 13.0–17.0)
MCHC: 34.1 g/dL (ref 30.0–36.0)
MCV: 93.4 fl (ref 78.0–100.0)
Platelets: 114 10*3/uL — ABNORMAL LOW (ref 150.0–400.0)
RBC: 5.09 Mil/uL (ref 4.22–5.81)
RDW: 13.8 % (ref 11.5–15.5)
WBC: 6.3 10*3/uL (ref 4.0–10.5)

## 2015-01-01 NOTE — Patient Instructions (Signed)
Please continue medications as directed.  I will call you with all of your lab results and when I have faxed over your surgical clearance.  Good luck with your surgery!

## 2015-01-01 NOTE — Progress Notes (Signed)
Pre visit review using our clinic review tool, if applicable. No additional management support is needed unless otherwise documented below in the visit note/SLS  

## 2015-01-02 LAB — CULTURE, URINE COMPREHENSIVE
COLONY COUNT: NO GROWTH
Organism ID, Bacteria: NO GROWTH

## 2015-01-03 ENCOUNTER — Encounter: Payer: Self-pay | Admitting: Physician Assistant

## 2015-01-03 DIAGNOSIS — Z136 Encounter for screening for cardiovascular disorders: Secondary | ICD-10-CM | POA: Insufficient documentation

## 2015-01-03 NOTE — Progress Notes (Signed)
Patient presents to clinic today for medical clearance for upcoming shoulder surgery.  Patient denies recent illness, fever, chills, malaise or fatigue.  Denies new medication.  Denies acute concerns at today's visit.  Past Medical History  Diagnosis Date  . Internal bleeding hemorrhoids 2012    "might bleed once/month; only when I strain"  . Hyperlipidemia   . Clostridium difficile infection   . Testicular hypofunction   . Biceps tendon rupture     bilateral  . GERD (gastroesophageal reflux disease)   . Colon polyps   . Allergy   . Arthritis   . Hypertension   . Diabetes     Borderline/Pre-diabetes  . Leg cramps     Current Outpatient Prescriptions on File Prior to Visit  Medication Sig Dispense Refill  . ALLERGIST [name] See admin instructions.  6  . Blood Glucose Monitoring Suppl (RELION CONFIRM GLUCOSE MONITOR) W/DEVICE KIT USE AS DIRECTED TO TEST BLOOD GLUCOSE DAILY DX: 250.00 1 kit 0  . Cholecalciferol (VITAMIN D-3) 5000 UNITS TABS Take 1 tablet by mouth daily.    . clomiPHENE (CLOMID) 50 MG tablet Take 25 mg by mouth daily.    . cyclobenzaprine (FLEXERIL) 10 MG tablet Take 10 mg by mouth 3 (three) times daily as needed for muscle spasms.    Marland Kitchen docusate sodium (COLACE) 100 MG capsule Take 100 mg by mouth daily.     . fenofibrate micronized (LOFIBRA) 134 MG capsule Take 1 capsule (134 mg total) by mouth daily before breakfast. 30 capsule 1  . glucose blood test strip RELION BLOOD GLUCOSE TEST STRIP Use as instructed to test daily Dx: 250.00 50 each 11  . HYDROcodone-acetaminophen (NORCO) 10-325 MG per tablet Take 1 tablet by mouth 4 (four) times daily as needed.    Marland Kitchen lisinopril-hydrochlorothiazide (PRINZIDE,ZESTORETIC) 20-12.5 MG per tablet TAKE 1 TABLET BY MOUTH 2 (TWO) TIMES DAILY. 180 tablet 1  . omega-3 acid ethyl esters (LOVAZA) 1 G capsule Take 2 capsules (2 g total) by mouth 2 (two) times daily. 120 capsule 1  . omeprazole  (PRILOSEC) 20 MG capsule Take 1 capsule (20 mg total) by mouth daily. 90 capsule 3  . Pitavastatin Calcium (LIVALO) 4 MG TABS Take 1 tablet (4 mg total) by mouth daily. 30 tablet 3  . potassium gluconate 595 MG TABS tablet Take 595 mg by mouth daily.    Marland Kitchen triamcinolone cream (KENALOG) 0.5 % Apply 1 application topically 2 (two) times daily as needed. To affected areas.    . Wheat Dextrin (EQ FIBER POWDER PO) Take 2 each by mouth 3 (three) times daily.      No current facility-administered medications on file prior to visit.    Allergies  Allergen Reactions  . Tetanus Toxoids Anaphylaxis  . Daptomycin Other (See Comments)    myalgias  . Asa [Aspirin] Nausea Only and Rash  . Fenofibrate Other (See Comments)    Constipation w/anal fissure [Sx]  . Penicillins Rash  . Prednisone (Pak) [Prednisone] Other (See Comments)    Hyperactivity    Family History  Problem Relation Age of Onset  . Heart disease Mother 52    Deceased  . Hypertension Mother   . Hyperlipidemia Mother   . Heart disease Father 54    Deceased  . CVA Father   . Hypertension Maternal Grandmother   . Hyperlipidemia Maternal Grandmother   . Cancer Maternal Grandmother     Colon Cancer  . Heart disease  Maternal Grandfather   . Hypertension Maternal Grandfather   . Hyperlipidemia Maternal Grandfather   . Cancer Maternal Grandfather     Lung Cancer  . Asthma Paternal Grandmother   . Heart disease Paternal Grandfather     History   Social History  . Marital Status: Married    Spouse Name: N/A    Number of Children: N/A  . Years of Education: N/A   Social History Main Topics  . Smoking status: Never Smoker   . Smokeless tobacco: Never Used  . Alcohol Use: Yes     Comment: 08/21/2012 "might drink a beer once a year"  . Drug Use: No  . Sexual Activity: Not Currently   Other Topics Concern  . Not on file   Social History Narrative   Marital Status: Married Pamala Hurry)    Children:  Step Daughter Sharyn Lull)     Pets: None    Living Situation: Lives with Pamala Hurry    Occupation: [employer] ([place])    Education: Associate's Degree in [employer]    Tobacco Use/Exposure:  None    Alcohol Use:  Rarely    Drug Use:  None   Diet:  Regular   Exercise: Limited     Hobbies:  Hunting, Fishing              Review of Systems - See HPI.  All other ROS are negative.  BP 136/80 mmHg  Pulse 67  Temp(Src) 98.1 F (36.7 C) (Oral)  Resp 16  Ht 5' 8"  (1.727 m)  Wt 265 lb 6 oz (120.373 kg)  BMI 40.36 kg/m2  SpO2 99%  Physical Exam  Constitutional: He is oriented to person, place, and time and well-developed, well-nourished, and in no distress.  HENT:  Head: Normocephalic and atraumatic.  Eyes: Conjunctivae are normal.  Cardiovascular: Normal rate, regular rhythm, normal heart sounds and intact distal pulses.   Pulmonary/Chest: No respiratory distress. He has no wheezes. He has no rales. He exhibits no tenderness.  Neurological: He is alert and oriented to person, place, and time.  Skin: Skin is warm and dry. No rash noted.  Psychiatric: Affect normal.  Vitals reviewed.   Recent Results (from the past 2160 hour(s))  Lipid Profile     Status: Abnormal   Collection Time: 11/23/14 11:02 AM  Result Value Ref Range   Cholesterol 304 (H) 0 - 200 mg/dL    Comment: ATP III Classification       Desirable:  < 200 mg/dL               Borderline High:  200 - 239 mg/dL          High:  > = 240 mg/dL   Triglycerides 1100.0 (H) 0.0 - 149.0 mg/dL    Comment: Normal:  <150 mg/dLBorderline High:  150 - 199 mg/dLTriglyceride is over 400; calculations on Lipids are invalid.   HDL 26.90 (L) >39.00 mg/dL   VLDL 220.0 (H) 0.0 - 40.0 mg/dL   Total CHOL/HDL Ratio 11     Comment:                Men          Women1/2 Average Risk     3.4          3.3Average Risk          5.0          4.42X Average Risk          9.6  7.13X Average Risk          15.0          11.0                       NonHDL 277.10      Comment: NOTE:  Non-HDL goal should be 30 mg/dL higher than patient's LDL goal (i.e. LDL goal of < 70 mg/dL, would have non-HDL goal of < 100 mg/dL)  Hepatic function panel     Status: None   Collection Time: 11/23/14 11:02 AM  Result Value Ref Range   Total Bilirubin 1.2 0.2 - 1.2 mg/dL   Bilirubin, Direct 0.1 0.0 - 0.3 mg/dL   Alkaline Phosphatase 43 39 - 117 U/L   AST 24 0 - 37 U/L   ALT 26 0 - 53 U/L   Total Protein 6.7 6.0 - 8.3 g/dL   Albumin 3.7 3.5 - 5.2 g/dL  LDL cholesterol, direct     Status: None   Collection Time: 11/23/14 11:02 AM  Result Value Ref Range   Direct LDL 53.0 mg/dL    Comment: Optimal:  <100 mg/dLNear or Above Optimal:  100-129 mg/dLBorderline High:  130-159 mg/dLHigh:  160-189 mg/dLVery High:  >190 mg/dL  Lipid Profile     Status: Abnormal   Collection Time: 12/22/14  7:56 AM  Result Value Ref Range   Cholesterol 167 0 - 200 mg/dL    Comment: ATP III Classification       Desirable:  < 200 mg/dL               Borderline High:  200 - 239 mg/dL          High:  > = 240 mg/dL   Triglycerides 150.0 (H) 0.0 - 149.0 mg/dL    Comment: Normal:  <150 mg/dLBorderline High:  150 - 199 mg/dL   HDL 34.50 (L) >39.00 mg/dL   VLDL 30.0 0.0 - 40.0 mg/dL   LDL Cholesterol 103 (H) 0 - 99 mg/dL   Total CHOL/HDL Ratio 5     Comment:                Men          Women1/2 Average Risk     3.4          3.3Average Risk          5.0          4.42X Average Risk          9.6          7.13X Average Risk          15.0          11.0                       NonHDL 132.50     Comment: NOTE:  Non-HDL goal should be 30 mg/dL higher than patient's LDL goal (i.e. LDL goal of < 70 mg/dL, would have non-HDL goal of < 100 mg/dL)  CBC     Status: Abnormal   Collection Time: 01/01/15  8:31 AM  Result Value Ref Range   WBC 6.3 4.0 - 10.5 K/uL   RBC 5.09 4.22 - 5.81 Mil/uL   Platelets 114.0 (L) 150.0 - 400.0 K/uL   Hemoglobin 16.2 13.0 - 17.0 g/dL   HCT 47.5 39.0 - 52.0 %   MCV 93.4 78.0 - 100.0  fl   MCHC 34.1 30.0 -  36.0 g/dL   RDW 13.8 11.5 - 02.2 %  Basic Metabolic Panel (BMET)     Status: Abnormal   Collection Time: 01/01/15  8:31 AM  Result Value Ref Range   Sodium 139 135 - 145 mEq/L   Potassium 4.2 3.5 - 5.1 mEq/L   Chloride 103 96 - 112 mEq/L   CO2 29 19 - 32 mEq/L   Glucose, Bld 103 (H) 70 - 99 mg/dL   BUN 21 6 - 23 mg/dL   Creatinine, Ser 1.3 0.4 - 1.5 mg/dL   Calcium 9.2 8.4 - 10.5 mg/dL   GFR 63.66 >60.00 mL/min  INR/PT     Status: None   Collection Time: 01/01/15  8:31 AM  Result Value Ref Range   INR 1.0 0.8 - 1.0 ratio   Prothrombin Time 10.9 9.6 - 13.1 sec  CULTURE, URINE COMPREHENSIVE     Status: None   Collection Time: 01/01/15  8:31 AM  Result Value Ref Range   Colony Count NO GROWTH    Organism ID, Bacteria NO GROWTH     Assessment/Plan: Screening, ischemic heart disease EKG looks good.  Will check lab workup to grant medical clearance for surgery. Will fax forms to patient's orthopedist once completed.

## 2015-01-03 NOTE — Assessment & Plan Note (Signed)
EKG looks good.  Will check lab workup to grant medical clearance for surgery. Will fax forms to patient's orthopedist once completed.

## 2015-01-04 ENCOUNTER — Telehealth: Payer: Self-pay | Admitting: Physician Assistant

## 2015-01-04 NOTE — Telephone Encounter (Signed)
Labs all loog good.  Surgical clearance is granted.  I will fax over papers -- Which doctor/practice will be doing the surgery?  I want to verify before sending forms.

## 2015-01-05 NOTE — Telephone Encounter (Signed)
Patient informed, understood & agreed/SLS  

## 2015-01-05 NOTE — Telephone Encounter (Signed)
Please call patient this morning to let him know his labs look good. Know that we have verified his surgeon, I will fax over his paperwork today.

## 2015-01-05 NOTE — Telephone Encounter (Signed)
Pt is following up states he never received a call in regards to his labs and would also like to verify if paperwork was faxed to [facility] Dr. Susa Day.

## 2015-01-07 NOTE — Telephone Encounter (Signed)
Patient states that [employer] never received form. Please refax to fax # on form.

## 2015-01-07 NOTE — Telephone Encounter (Signed)
Fax # [phone]

## 2015-01-08 ENCOUNTER — Encounter: Payer: Self-pay | Admitting: Physician Assistant

## 2015-01-08 NOTE — Telephone Encounter (Signed)
Reprinted recent exam, labs and EKG results.  Have written letter granting surgical clearance.  Please fax to specialist. Please confirm was received by their office.

## 2015-01-08 NOTE — Telephone Encounter (Signed)
Paperwork has already been sent for scanning per Jessica/SLS

## 2015-01-08 NOTE — Telephone Encounter (Signed)
[place] to verify fax number; fax for Dr. Tonita Cong needs to go to [facility] at [phone]; Surgical Clearance paperwork refaxed, received confirmation/SLS

## 2015-01-11 NOTE — Progress Notes (Signed)
Please put orders in Epic surgery 01-20-15 pre op 01-14-15 Thanks

## 2015-01-12 ENCOUNTER — Ambulatory Visit: Payer: Self-pay | Admitting: Orthopedic Surgery

## 2015-01-12 NOTE — H&P (Signed)
Ian [name] is an 54 y.o. male.   Chief Complaint: back and leg pain HPI: The patient is a 55 year old male who presents today for follow up of their back. The patient is being followed for their back pain. They are now 12 day(s) out from [facility] and 11 weeks out from when symptoms began. Symptoms reported today include: pain (left buttock and thigh). Current treatment includes: physical therapy and NSAIDs. The following medication has been used for pain control: Tylenol and Norco and Flexeril prn. The patient reports their current pain level to be mild (to severe. The pain is worse in the morning). The patient presents today following ESI (@ L4-5 left).  The patient follows up. Temporary relief from his injection. He is mainly complaining of radicular pain as opposed to the back pain.  Past Medical History  Diagnosis Date  . Internal bleeding hemorrhoids 2012    "might bleed once/month; only when I strain"  . Hyperlipidemia   . Clostridium difficile infection   . Testicular hypofunction   . Biceps tendon rupture     bilateral  . GERD (gastroesophageal reflux disease)   . Colon polyps   . Allergy   . Arthritis   . Hypertension   . Diabetes     Borderline/Pre-diabetes  . Leg cramps     Past Surgical History  Procedure Laterality Date  . Shoulder arthroscopy w/ rotator cuff repair  07/09/2012    left  . Appendectomy  2011  . Colonoscopy w/ biopsies and polypectomy  2012  . Shoulder arthroscopy  08/22/2012    Procedure: ARTHROSCOPY SHOULDER;  Surgeon: Marin Shutter, MD;  Location: [facility];  Service: Orthopedics;  Laterality: Left;  Left shoulder arthroscopic lavage and debridement  . Shoulder arthroscopy  09/02/2012    Procedure: ARTHROSCOPY SHOULDER;  Surgeon: Marin Shutter, MD;  Location: [facility];  Service: Orthopedics;  Laterality: Left;  Left shoulder arthroscopy irrigation and debridement  . Fissurectomy    . Excisional hemorrhoidectomy    . Internal sphincterotomy    . Hemorrhoid  surgery  06/24/2013  . Anal fissure repair  06/24/2013  . Wisdom tooth extraction    . Skin lesion excision      Benign    Family History  Problem Relation Age of Onset  . Heart disease Mother 72    Deceased  . Hypertension Mother   . Hyperlipidemia Mother   . Heart disease Father 25    Deceased  . CVA Father   . Hypertension Maternal Grandmother   . Hyperlipidemia Maternal Grandmother   . Cancer Maternal Grandmother     Colon Cancer  . Heart disease Maternal Grandfather   . Hypertension Maternal Grandfather   . Hyperlipidemia Maternal Grandfather   . Cancer Maternal Grandfather     Lung Cancer  . Asthma Paternal Grandmother   . Heart disease Paternal Grandfather    Social History:  reports that he has never smoked. He has never used smokeless tobacco. He reports that he drinks alcohol. He reports that he does not use illicit drugs.  Allergies:  Allergies  Allergen Reactions  . Tetanus Toxoids Anaphylaxis  . Daptomycin Other (See Comments)    myalgias  . Asa [Aspirin] Nausea Only and Rash  . Fenofibrate Other (See Comments)    Constipation w/anal fissure [Sx]  . Penicillins Rash  . Prednisone (Pak) [Prednisone] Other (See Comments)    Hyperactivity     (Not in a hospital admission)  No results found for  this or any previous visit (from the past 48 hour(s)). No results found.  Review of Systems  Constitutional: Negative.   HENT: Negative.   Eyes: Negative.   Respiratory: Negative.   Cardiovascular: Negative.   Gastrointestinal: Negative.   Genitourinary: Negative.   Musculoskeletal: Positive for back pain.  Skin: Negative.   Neurological: Positive for focal weakness.  Psychiatric/Behavioral: Negative.     There were no vitals taken for this visit. Physical Exam  Constitutional: He is oriented to person, place, and time. He appears well-developed and well-nourished.  HENT:  Head: Normocephalic and atraumatic.  Eyes: Conjunctivae and EOM are normal.  Pupils are equal, round, and reactive to light.  Neck: Normal range of motion. Neck supple.  Cardiovascular: Normal rate and regular rhythm.   Respiratory: Effort normal and breath sounds normal.  GI: Soft. Bowel sounds are normal.  Musculoskeletal:  On exam straight leg raise produces buttock, thigh, and calf pain on the left, negative on the right. He has some minor quad weakness and hip flexor weakness on the left compared to the right. Limited flexion and extension.  Lumbar spine exam reveals no evidence of soft tissue swelling, no evidence of soft tissue swelling or deformity or skin ecchymosis. On palpation there is no tenderness of the lumbar spine. No flank pain with percussion. The abdomen is soft and nontender. Nontender over the trochanters. No cellulitis or lymphadenopathy.  Motor is 5/5 including EHL, tibialis anterior, plantarflexion, and hamstrings. The patient is normoreflexic. There is no Babinski or clonus. Sensory exam is intact to light touch. The patient has good distal pulses. No DVT. No pain and normal range of motion without instability of the knees and ankles.   Neurological: He is alert and oriented to person, place, and time. He has normal reflexes.  Skin: Skin is warm and dry.  Psychiatric: He has a normal mood and affect.    He has had his myelogram, which indicates moderate to severe stenosis at T3, greater on the left than the right. Extruded disc material compressing the left L3 nerve root. At 3-4 disc degeneration severe and left subarticular extrusion compressing the left L4 nerve root. At 4-5 severe disc degeneration, moderate stenosis, lateral recess stenosis is noted.  Three view radiographs and review of his CT myelogram demonstrates no instability, stenosis noted at both these levels.   Assessment/Plan 1. 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 2. Multilevel disc degeneration.  We  had an extensive discussion concerning current pathology, relevant anatomy, and treatment options. His main pain is the left leg. The disc herniation at 2-3, 3-4, and possibly at 4-5 may be causative. We discussed decompression at those areas.  I had an extensive discussion of the risks and benefits of lumbar decompression with the patient including bleeding, infection, damage to neurovascular structures, epidural fibrosis, CSF leakage requiring repair. We also discussed increase in pain, adjacent segment disease, recurrent disc herniation, need for future surgery including repeat decompression and/or fusion. We also discussed risks of postoperative hematoma, paralysis, anesthetic complications including DVT, PE, death, cardiopulmonary dysfunction. In addition, the perioperative and postoperative courses were discussed in detail including the rehabilitative time and return to functional activity and work. I provided the patient with an illustrated handout and utilized the appropriate surgical models.  However, not perform multilevel fusion, therefore residual back pain if any. He reports knee pain is into the leg. We discussed restricting his activities afterwards. He would like to  stay on a mower. Sitting will be appropriate. We will proceed accordingly. Continue activity modifications, strategies to avoid reinjury. Obtain preoperative clearance from medical physician.  Plan microlumbar decompression L4-5, L3-4, possible L2-3  Jolita Haefner M. PA-C for Dr. Tonita Cong 01/12/2015, 8:13 AM

## 2015-01-13 ENCOUNTER — Other Ambulatory Visit (HOSPITAL_COMMUNITY): Payer: Self-pay | Admitting: *Deleted

## 2015-01-13 NOTE — Patient Instructions (Addendum)
Ian [name]  01/13/2015   Your procedure is scheduled on: Wednesday January 27th, 2016  Report to [facility] Main  Entrance and follow signs to               [place] at 530 AM.  Call this number if you have problems the morning of surgery [phone]   Remember:  Do not eat food or drink liquids :After Midnight.     Take these medicines the morning of surgery with A SIP OF WATER: OMEPRAZOLE, HYDROCODONE IF NEEDED                               You may not have any metal on your body including hair pins and              piercings  Do not wear jewelry, make-up, lotions, powders or perfumes.             Do not wear nail polish.  Do not shave  48 hours prior to surgery.              Men may shave face and neck.   Do not bring valuables to the hospital. [place].  Contacts, dentures or bridgework may not be worn into surgery.  Leave suitcase in the car. After surgery it may be brought to your room.     Patients discharged the day of surgery will not be allowed to drive home.  Name and phone number of your driver:  Special Instructions: N/A              Please read over the following fact sheets you were given: _____________________________________________________________________             [facility] - Preparing for Surgery Before surgery, you can play an important role.  Because skin is not sterile, your skin needs to be as free of germs as possible.  You can reduce the number of germs on your skin by washing with CHG (chlorahexidine gluconate) soap before surgery.  CHG is an antiseptic cleaner which kills germs and bonds with the skin to continue killing germs even after washing. Please DO NOT use if you have an allergy to CHG or antibacterial soaps.  If your skin becomes reddened/irritated stop using the CHG and inform your nurse when you arrive at Short Stay. Do not shave (including legs and  underarms) for at least 48 hours prior to the first CHG shower.  You may shave your face/neck. Please follow these instructions carefully:  1.  Shower with CHG Soap the night before surgery and the  morning of Surgery.  2.  If you choose to wash your hair, wash your hair first as usual with your  normal  shampoo.  3.  After you shampoo, rinse your hair and body thoroughly to remove the  shampoo.                           4.  Use CHG as you would any other liquid soap.  You can apply chg directly  to the skin and wash  Gently with a scrungie or clean washcloth.  5.  Apply the CHG Soap to your body ONLY FROM THE NECK DOWN.   Do not use on face/ open                           Wound or open sores. Avoid contact with eyes, ears mouth and genitals (private parts).                       Wash face,  Genitals (private parts) with your normal soap.             6.  Wash thoroughly, paying special attention to the area where your surgery  will be performed.  7.  Thoroughly rinse your body with warm water from the neck down.  8.  DO NOT shower/wash with your normal soap after using and rinsing off  the CHG Soap.                9.  Pat yourself dry with a clean towel.            10.  Wear clean pajamas.            11.  Place clean sheets on your bed the night of your first shower and do not  sleep with pets. Day of Surgery : Do not apply any lotions/deodorants the morning of surgery.  Please wear clean clothes to the hospital/surgery center.  FAILURE TO FOLLOW THESE INSTRUCTIONS MAY RESULT IN THE CANCELLATION OF YOUR SURGERY PATIENT SIGNATURE_________________________________  NURSE SIGNATURE__________________________________  ________________________________________________________________________   Ian [name]  An incentive spirometer is a tool that can help keep your lungs clear and active. This tool measures how well you are filling your lungs with each breath. Taking  long deep breaths may help reverse or decrease the chance of developing breathing (pulmonary) problems (especially infection) following:  A long period of time when you are unable to move or be active. BEFORE THE PROCEDURE   If the spirometer includes an indicator to show your best effort, your nurse or respiratory therapist will set it to a desired goal.  If possible, sit up straight or lean slightly forward. Try not to slouch.  Hold the incentive spirometer in an upright position. INSTRUCTIONS FOR USE  1. Sit on the edge of your bed if possible, or sit up as far as you can in bed or on a chair. 2. Hold the incentive spirometer in an upright position. 3. Breathe out normally. 4. Place the mouthpiece in your mouth and seal your lips tightly around it. 5. Breathe in slowly and as deeply as possible, raising the piston or the ball toward the top of the column. 6. Hold your breath for 3-5 seconds or for as long as possible. Allow the piston or ball to fall to the bottom of the column. 7. Remove the mouthpiece from your mouth and breathe out normally. 8. Rest for a few seconds and repeat Steps 1 through 7 at least 10 times every 1-2 hours when you are awake. Take your time and take a few normal breaths between deep breaths. 9. The spirometer may include an indicator to show your best effort. Use the indicator as a goal to work toward during each repetition. 10. After each set of 10 deep breaths, practice coughing to be sure your lungs are clear. If you have an incision (the cut made at the time of surgery),  support your incision when coughing by placing a pillow or rolled up towels firmly against it. Once you are able to get out of bed, walk around indoors and cough well. You may stop using the incentive spirometer when instructed by your caregiver.  RISKS AND COMPLICATIONS  Take your time so you do not get dizzy or light-headed.  If you are in pain, you may need to take or ask for pain  medication before doing incentive spirometry. It is harder to take a deep breath if you are having pain. AFTER USE  Rest and breathe slowly and easily.  It can be helpful to keep track of a log of your progress. Your caregiver can provide you with a simple table to help with this. If you are using the spirometer at home, follow these instructions: [place] IF:   You are having difficultly using the spirometer.  You have trouble using the spirometer as often as instructed.  Your pain medication is not giving enough relief while using the spirometer.  You develop fever of 100.5 F (38.1 C) or higher. SEEK IMMEDIATE MEDICAL CARE IF:   You cough up bloody sputum that had not been present before.  You develop fever of 102 F (38.9 C) or greater.  You develop worsening pain at or near the incision site. MAKE SURE YOU:   Understand these instructions.  Will watch your condition.  Will get help right away if you are not doing well or get worse. Document Released: 04/23/2007 Document Revised: 03/04/2012 Document Reviewed: 06/24/2007 [facility] Patient Information 2014 [place], [place].   ________________________________________________________________________

## 2015-01-14 ENCOUNTER — Ambulatory Visit: Payer: Self-pay | Admitting: Orthopedic Surgery

## 2015-01-14 ENCOUNTER — Encounter (HOSPITAL_COMMUNITY)
Admission: RE | Admit: 2015-01-14 | Discharge: 2015-01-14 | Disposition: A | Discharge status: Home / Self Care | Payer: BLUE CROSS/BLUE SHIELD | Source: Ambulatory Visit | Attending: Specialist | Admitting: Specialist

## 2015-01-14 ENCOUNTER — Ambulatory Visit (HOSPITAL_COMMUNITY)
Admission: RE | Admit: 2015-01-14 | Discharge: 2015-01-14 | Disposition: A | Discharge status: Home / Self Care | Payer: BLUE CROSS/BLUE SHIELD | Source: Ambulatory Visit | Attending: Anesthesiology | Admitting: Anesthesiology

## 2015-01-14 ENCOUNTER — Encounter (HOSPITAL_COMMUNITY): Payer: Self-pay

## 2015-01-14 ENCOUNTER — Ambulatory Visit (HOSPITAL_COMMUNITY)
Admission: RE | Admit: 2015-01-14 | Discharge: 2015-01-14 | Disposition: A | Discharge status: Home / Self Care | Payer: BLUE CROSS/BLUE SHIELD | Source: Ambulatory Visit | Attending: Orthopedic Surgery | Admitting: Orthopedic Surgery

## 2015-01-14 DIAGNOSIS — I10 Essential (primary) hypertension: Secondary | ICD-10-CM | POA: Diagnosis present

## 2015-01-14 DIAGNOSIS — Z01818 Encounter for other preprocedural examination: Secondary | ICD-10-CM | POA: Insufficient documentation

## 2015-01-14 DIAGNOSIS — M4806 Spinal stenosis, lumbar region: Secondary | ICD-10-CM | POA: Diagnosis not present

## 2015-01-14 DIAGNOSIS — M48061 Spinal stenosis, lumbar region without neurogenic claudication: Secondary | ICD-10-CM

## 2015-01-14 HISTORY — DX: Other hemorrhoids: K64.8

## 2015-01-14 LAB — BASIC METABOLIC PANEL
ANION GAP: 6 (ref 5–15)
BUN: 22 mg/dL (ref 6–23)
CO2: 28 mmol/L (ref 19–32)
CREATININE: 1.27 mg/dL (ref 0.50–1.35)
Calcium: 9.5 mg/dL (ref 8.4–10.5)
Chloride: 101 mEq/L (ref 96–112)
GFR, EST AFRICAN AMERICAN: 72 mL/min — AB (ref >90)
GFR, EST NON AFRICAN AMERICAN: 62 mL/min — AB (ref >90)
Glucose, Bld: 131 mg/dL — ABNORMAL HIGH (ref 70–99)
Potassium: 4.1 mmol/L (ref 3.5–5.1)
SODIUM: 135 mmol/L (ref 135–145)

## 2015-01-14 LAB — SURGICAL PCR SCREEN
MRSA, PCR: POSITIVE — AB
Staphylococcus aureus: POSITIVE — AB

## 2015-01-14 LAB — CBC
HCT: 46.6 % (ref 39.0–52.0)
HEMOGLOBIN: 16.5 g/dL (ref 13.0–17.0)
MCH: 32.8 pg (ref 26.0–34.0)
MCHC: 35.4 g/dL (ref 30.0–36.0)
MCV: 92.6 fL (ref 78.0–100.0)
Platelets: 129 10*3/uL — ABNORMAL LOW (ref 150–400)
RBC: 5.03 MIL/uL (ref 4.22–5.81)
RDW: 13.6 % (ref 11.5–15.5)
WBC: 7 10*3/uL (ref 4.0–10.5)

## 2015-01-14 NOTE — Progress Notes (Signed)
   01/14/15 0851  OBSTRUCTIVE SLEEP APNEA  Have you ever been diagnosed with sleep apnea through a sleep study? No  Do you snore loudly (loud enough to be heard through closed doors)?  1  Do you often feel tired, fatigued, or sleepy during the daytime? 1  Has anyone observed you stop breathing during your sleep? 0  Do you have, or are you being treated for high blood pressure? 1  BMI more than 35 kg/m2? 1  Age over 56 years old? 1  Neck circumference greater than 40 cm/16 inches? 1  Gender: 1  Obstructive Sleep Apnea Score 7  Score 4 or greater  Results sent to PCP

## 2015-01-14 NOTE — Progress Notes (Signed)
Surgical screen and cbc results sent to dr beane faxed by epic

## 2015-01-14 NOTE — Progress Notes (Signed)
EKG 11-02-15 EPIC

## 2015-01-19 ENCOUNTER — Encounter (HOSPITAL_COMMUNITY): Payer: Self-pay | Admitting: Anesthesiology

## 2015-01-19 MED ORDER — VANCOMYCIN HCL 10 G IV SOLR
1500.0000 mg | INTRAVENOUS | Status: DC
  Administered 2015-01-20: 1500 mg via INTRAVENOUS
  Filled 2015-01-19: qty 1500

## 2015-01-19 MED ORDER — DEXTROSE 5 % IV SOLN
3.0000 g | INTRAVENOUS | Status: AC
  Administered 2015-01-20: 3 g via INTRAVENOUS
  Filled 2015-01-19 (×2): qty 3000

## 2015-01-19 NOTE — Anesthesia Preprocedure Evaluation (Addendum)
Anesthesia Evaluation  Patient identified by MRN, date of birth, ID band Patient awake    Reviewed: Allergy & Precautions, NPO status , Patient's Chart, lab work & pertinent test results  Airway Mallampati: II  TM Distance: >3 FB Neck ROM: Full    Dental no notable dental hx.    Pulmonary neg pulmonary ROS,  breath sounds clear to auscultation  Pulmonary exam normal       Cardiovascular Exercise Tolerance: Good hypertension, Pt. on medications negative cardio ROS  Rhythm:Regular Rate:Normal     Neuro/Psych negative neurological ROS  negative psych ROS   GI/Hepatic Neg liver ROS, GERD-  Medicated,  Endo/Other  diabetes, Type 2Morbid obesity  Renal/GU negative Renal ROS  negative genitourinary   Musculoskeletal  (+) Arthritis -,   Abdominal (+) + obese,   Peds negative pediatric ROS (+)  Hematology negative hematology ROS (+)   Anesthesia Other Findings   Reproductive/Obstetrics negative OB ROS                            Anesthesia Physical Anesthesia Plan  ASA: III  Anesthesia Plan: General   Post-op Pain Management:    Induction: Intravenous  Airway Management Planned: Oral ETT  Additional Equipment:   Intra-op Plan:   Post-operative Plan: Extubation in OR  Informed Consent: I have reviewed the patients History and Physical, chart, labs and discussed the procedure including the risks, benefits and alternatives for the proposed anesthesia with the patient or authorized representative who has indicated his/her understanding and acceptance.   Dental advisory given  Plan Discussed with: CRNA  Anesthesia Plan Comments:         Anesthesia Quick Evaluation

## 2015-01-20 ENCOUNTER — Ambulatory Visit (HOSPITAL_COMMUNITY)
Admission: RE | Admit: 2015-01-20 | Discharge: 2015-01-22 | Disposition: A | Discharge status: Home / Self Care | Payer: BLUE CROSS/BLUE SHIELD | Source: Ambulatory Visit | Attending: Specialist | Admitting: Specialist

## 2015-01-20 ENCOUNTER — Ambulatory Visit (HOSPITAL_COMMUNITY): Payer: BLUE CROSS/BLUE SHIELD

## 2015-01-20 ENCOUNTER — Ambulatory Visit (HOSPITAL_COMMUNITY): Payer: BLUE CROSS/BLUE SHIELD | Admitting: Anesthesiology

## 2015-01-20 ENCOUNTER — Encounter (HOSPITAL_COMMUNITY): Payer: Self-pay | Admitting: *Deleted

## 2015-01-20 ENCOUNTER — Encounter (HOSPITAL_COMMUNITY)
Admission: RE | Disposition: A | Discharge status: Home / Self Care | Payer: Self-pay | Source: Ambulatory Visit | Attending: Specialist

## 2015-01-20 DIAGNOSIS — Z886 Allergy status to analgesic agent status: Secondary | ICD-10-CM | POA: Diagnosis not present

## 2015-01-20 DIAGNOSIS — Z6841 Body Mass Index (BMI) 40.0 and over, adult: Secondary | ICD-10-CM | POA: Insufficient documentation

## 2015-01-20 DIAGNOSIS — Z888 Allergy status to other drugs, medicaments and biological substances status: Secondary | ICD-10-CM | POA: Insufficient documentation

## 2015-01-20 DIAGNOSIS — I10 Essential (primary) hypertension: Secondary | ICD-10-CM | POA: Diagnosis not present

## 2015-01-20 DIAGNOSIS — M1990 Unspecified osteoarthritis, unspecified site: Secondary | ICD-10-CM | POA: Diagnosis not present

## 2015-01-20 DIAGNOSIS — E785 Hyperlipidemia, unspecified: Secondary | ICD-10-CM | POA: Diagnosis not present

## 2015-01-20 DIAGNOSIS — Z880 Allergy status to penicillin: Secondary | ICD-10-CM | POA: Diagnosis not present

## 2015-01-20 DIAGNOSIS — M5126 Other intervertebral disc displacement, lumbar region: Secondary | ICD-10-CM | POA: Diagnosis present

## 2015-01-20 DIAGNOSIS — R52 Pain, unspecified: Secondary | ICD-10-CM

## 2015-01-20 DIAGNOSIS — M5136 Other intervertebral disc degeneration, lumbar region: Secondary | ICD-10-CM | POA: Diagnosis not present

## 2015-01-20 DIAGNOSIS — E781 Pure hyperglyceridemia: Secondary | ICD-10-CM

## 2015-01-20 DIAGNOSIS — Z887 Allergy status to serum and vaccine status: Secondary | ICD-10-CM | POA: Diagnosis not present

## 2015-01-20 DIAGNOSIS — M4806 Spinal stenosis, lumbar region: Secondary | ICD-10-CM | POA: Insufficient documentation

## 2015-01-20 DIAGNOSIS — M419 Scoliosis, unspecified: Secondary | ICD-10-CM | POA: Insufficient documentation

## 2015-01-20 DIAGNOSIS — M48061 Spinal stenosis, lumbar region without neurogenic claudication: Secondary | ICD-10-CM

## 2015-01-20 DIAGNOSIS — Z6838 Body mass index (BMI) 38.0-38.9, adult: Secondary | ICD-10-CM | POA: Insufficient documentation

## 2015-01-20 DIAGNOSIS — E119 Type 2 diabetes mellitus without complications: Secondary | ICD-10-CM | POA: Diagnosis not present

## 2015-01-20 DIAGNOSIS — Z881 Allergy status to other antibiotic agents status: Secondary | ICD-10-CM | POA: Insufficient documentation

## 2015-01-20 DIAGNOSIS — K219 Gastro-esophageal reflux disease without esophagitis: Secondary | ICD-10-CM | POA: Insufficient documentation

## 2015-01-20 DIAGNOSIS — G4762 Sleep related leg cramps: Secondary | ICD-10-CM | POA: Insufficient documentation

## 2015-01-20 DIAGNOSIS — K648 Other hemorrhoids: Secondary | ICD-10-CM | POA: Insufficient documentation

## 2015-01-20 DIAGNOSIS — Z86010 Personal history of colonic polyps: Secondary | ICD-10-CM | POA: Insufficient documentation

## 2015-01-20 HISTORY — PX: LUMBAR LAMINECTOMY/DECOMPRESSION MICRODISCECTOMY: SHX5026

## 2015-01-20 LAB — GLUCOSE, CAPILLARY
GLUCOSE-CAPILLARY: 138 mg/dL — AB (ref 70–99)
GLUCOSE-CAPILLARY: 147 mg/dL — AB (ref 70–99)
Glucose-Capillary: 139 mg/dL — ABNORMAL HIGH (ref 70–99)
Glucose-Capillary: 130 mg/dL — ABNORMAL HIGH (ref 70–99)
Glucose-Capillary: 155 mg/dL — ABNORMAL HIGH (ref 70–99)

## 2015-01-20 SURGERY — LUMBAR LAMINECTOMY/DECOMPRESSION MICRODISCECTOMY 2 LEVELS
Anesthesia: General | Site: Back

## 2015-01-20 MED ORDER — FENTANYL CITRATE 0.05 MG/ML IJ SOLN
INTRAMUSCULAR | Status: AC
  Filled 2015-01-20: qty 5

## 2015-01-20 MED ORDER — VANCOMYCIN HCL 10 G IV SOLR
1500.0000 mg | Freq: Once | INTRAVENOUS | Status: DC
Start: 2015-01-20 — End: 2015-01-22
  Filled 2015-01-20: qty 1500

## 2015-01-20 MED ORDER — OXYCODONE-ACETAMINOPHEN 5-325 MG PO TABS
1.0000 | ORAL_TABLET | ORAL | Status: DC | PRN
  Administered 2015-01-22: 2 via ORAL
  Filled 2015-01-20 (×2): qty 2

## 2015-01-20 MED ORDER — DOCUSATE SODIUM 100 MG PO CAPS: 100.0000 mg | ORAL_CAPSULE | Freq: Two times a day (BID) | ORAL | Status: DC | PRN

## 2015-01-20 MED ORDER — PROPOFOL 10 MG/ML IV BOLUS
INTRAVENOUS | Status: DC | PRN
  Administered 2015-01-20: 170 mg via INTRAVENOUS

## 2015-01-20 MED ORDER — POTASSIUM CHLORIDE IN NACL 20-0.45 MEQ/L-% IV SOLN
INTRAVENOUS | Status: AC
  Administered 2015-01-20: 16:00:00 via INTRAVENOUS
  Filled 2015-01-20 (×2): qty 1000

## 2015-01-20 MED ORDER — MENTHOL 3 MG MT LOZG
1.0000 | LOZENGE | OROMUCOSAL | Status: DC | PRN
  Filled 2015-01-20: qty 9

## 2015-01-20 MED ORDER — VANCOMYCIN HCL IN DEXTROSE 1-5 GM/200ML-% IV SOLN
INTRAVENOUS | Status: AC
  Filled 2015-01-20: qty 200

## 2015-01-20 MED ORDER — GLYCOPYRROLATE 0.2 MG/ML IJ SOLN
INTRAMUSCULAR | Status: AC
  Filled 2015-01-20: qty 2

## 2015-01-20 MED ORDER — ACETAMINOPHEN 325 MG PO TABS: 650.0000 mg | ORAL_TABLET | ORAL | Status: DC | PRN

## 2015-01-20 MED ORDER — SODIUM CHLORIDE 0.9 % IV SOLN: 250.0000 mL | INTRAVENOUS | Status: DC

## 2015-01-20 MED ORDER — LISINOPRIL 10 MG PO TABS
10.0000 mg | ORAL_TABLET | Freq: Every day | ORAL | Status: DC
  Administered 2015-01-20 – 2015-01-21 (×2): 10 mg via ORAL
  Filled 2015-01-20 (×3): qty 1

## 2015-01-20 MED ORDER — LACTATED RINGERS IV SOLN
INTRAVENOUS | Status: DC
  Administered 2015-01-20 (×3): via INTRAVENOUS

## 2015-01-20 MED ORDER — METHOCARBAMOL 500 MG PO TABS
500.0000 mg | ORAL_TABLET | Freq: Three times a day (TID) | ORAL | Status: DC | PRN
Start: 2015-01-20 — End: 2015-05-05

## 2015-01-20 MED ORDER — EPHEDRINE SULFATE 50 MG/ML IJ SOLN
INTRAMUSCULAR | Status: AC
  Filled 2015-01-20: qty 1

## 2015-01-20 MED ORDER — NEOSTIGMINE METHYLSULFATE 10 MG/10ML IV SOLN
INTRAVENOUS | Status: DC | PRN
  Administered 2015-01-20: 3 mg via INTRAVENOUS

## 2015-01-20 MED ORDER — HYDROCODONE-ACETAMINOPHEN 5-325 MG PO TABS
1.0000 | ORAL_TABLET | ORAL | Status: DC | PRN
  Administered 2015-01-20 (×2): 2 via ORAL
  Administered 2015-01-21 (×5): 1 via ORAL
  Administered 2015-01-22 (×2): 2 via ORAL
  Filled 2015-01-20: qty 2
  Filled 2015-01-20 (×2): qty 1
  Filled 2015-01-20 (×2): qty 2
  Filled 2015-01-20 (×2): qty 1
  Filled 2015-01-20 (×2): qty 2

## 2015-01-20 MED ORDER — PANTOPRAZOLE SODIUM 40 MG PO TBEC
40.0000 mg | DELAYED_RELEASE_TABLET | Freq: Every day | ORAL | Status: DC
  Administered 2015-01-21 – 2015-01-22 (×2): 40 mg via ORAL
  Filled 2015-01-20 (×2): qty 1

## 2015-01-20 MED ORDER — DOCUSATE SODIUM 100 MG PO CAPS
100.0000 mg | ORAL_CAPSULE | Freq: Two times a day (BID) | ORAL | Status: DC
  Administered 2015-01-20 – 2015-01-22 (×4): 100 mg via ORAL

## 2015-01-20 MED ORDER — DOCUSATE SODIUM 100 MG PO CAPS: 100.0000 mg | ORAL_CAPSULE | Freq: Two times a day (BID) | ORAL | Status: DC

## 2015-01-20 MED ORDER — LISINOPRIL-HYDROCHLOROTHIAZIDE 10-12.5 MG PO TABS: 1.0000 | ORAL_TABLET | Freq: Two times a day (BID) | ORAL | Status: DC

## 2015-01-20 MED ORDER — GLYCOPYRROLATE 0.2 MG/ML IJ SOLN
INTRAMUSCULAR | Status: DC | PRN
  Administered 2015-01-20: 0.4 mg via INTRAVENOUS

## 2015-01-20 MED ORDER — SODIUM CHLORIDE 0.9 % IJ SOLN
3.0000 mL | Freq: Two times a day (BID) | INTRAMUSCULAR | Status: DC
  Administered 2015-01-21: 3 mL via INTRAVENOUS

## 2015-01-20 MED ORDER — VITAMIN D3 25 MCG (1000 UNIT) PO TABS
5000.0000 [IU] | ORAL_TABLET | Freq: Every day | ORAL | Status: DC
  Administered 2015-01-20 – 2015-01-22 (×3): 5000 [IU] via ORAL
  Filled 2015-01-20 (×3): qty 5

## 2015-01-20 MED ORDER — PSYLLIUM 95 % PO PACK
PACK | Freq: Three times a day (TID) | ORAL | Status: DC
  Filled 2015-01-20 (×8): qty 1

## 2015-01-20 MED ORDER — VANCOMYCIN HCL 500 MG IV SOLR
500.0000 mg | INTRAVENOUS | Status: DC
  Filled 2015-01-20: qty 500

## 2015-01-20 MED ORDER — ONDANSETRON HCL 4 MG/2ML IJ SOLN
INTRAMUSCULAR | Status: AC
  Filled 2015-01-20: qty 2

## 2015-01-20 MED ORDER — ACETAMINOPHEN 650 MG RE SUPP: 650.0000 mg | RECTAL | Status: DC | PRN

## 2015-01-20 MED ORDER — SODIUM CHLORIDE 0.9 % IJ SOLN: 3.0000 mL | INTRAMUSCULAR | Status: DC | PRN

## 2015-01-20 MED ORDER — BUPIVACAINE-EPINEPHRINE (PF) 0.5% -1:200000 IJ SOLN
INTRAMUSCULAR | Status: AC
  Filled 2015-01-20: qty 30

## 2015-01-20 MED ORDER — MIDAZOLAM HCL 2 MG/2ML IJ SOLN
INTRAMUSCULAR | Status: AC
  Filled 2015-01-20: qty 2

## 2015-01-20 MED ORDER — PROPOFOL 10 MG/ML IV BOLUS
INTRAVENOUS | Status: AC
  Filled 2015-01-20: qty 20

## 2015-01-20 MED ORDER — THROMBIN 5000 UNITS EX SOLR
CUTANEOUS | Status: AC
  Filled 2015-01-20: qty 10000

## 2015-01-20 MED ORDER — HYDROMORPHONE HCL 1 MG/ML IJ SOLN
INTRAMUSCULAR | Status: AC
  Filled 2015-01-20: qty 1

## 2015-01-20 MED ORDER — MIDAZOLAM HCL 5 MG/5ML IJ SOLN
INTRAMUSCULAR | Status: DC | PRN
  Administered 2015-01-20: 2 mg via INTRAVENOUS

## 2015-01-20 MED ORDER — HYDROMORPHONE HCL 1 MG/ML IJ SOLN
0.2500 mg | INTRAMUSCULAR | Status: DC | PRN
  Administered 2015-01-20 (×3): 0.5 mg via INTRAVENOUS

## 2015-01-20 MED ORDER — NEOSTIGMINE METHYLSULFATE 10 MG/10ML IV SOLN
INTRAVENOUS | Status: AC
  Filled 2015-01-20: qty 1

## 2015-01-20 MED ORDER — PHENOL 1.4 % MT LIQD
1.0000 | OROMUCOSAL | Status: DC | PRN
  Filled 2015-01-20: qty 177

## 2015-01-20 MED ORDER — ALUM & MAG HYDROXIDE-SIMETH 200-200-20 MG/5ML PO SUSP: 30.0000 mL | Freq: Four times a day (QID) | ORAL | Status: DC | PRN

## 2015-01-20 MED ORDER — BUPIVACAINE-EPINEPHRINE (PF) 0.5% -1:200000 IJ SOLN
INTRAMUSCULAR | Status: DC | PRN
  Administered 2015-01-20: 20 mL

## 2015-01-20 MED ORDER — THROMBIN 5000 UNITS EX SOLR
CUTANEOUS | Status: DC | PRN
  Administered 2015-01-20: 10000 [IU] via TOPICAL

## 2015-01-20 MED ORDER — INSULIN ASPART 100 UNIT/ML ~~LOC~~ SOLN
0.0000 [IU] | Freq: Three times a day (TID) | SUBCUTANEOUS | Status: DC
  Administered 2015-01-20 – 2015-01-21 (×2): 2 [IU] via SUBCUTANEOUS
  Administered 2015-01-21: 3 [IU] via SUBCUTANEOUS
  Administered 2015-01-21 – 2015-01-22 (×2): 2 [IU] via SUBCUTANEOUS

## 2015-01-20 MED ORDER — PROMETHAZINE HCL 25 MG/ML IJ SOLN: 6.2500 mg | INTRAMUSCULAR | Status: DC | PRN

## 2015-01-20 MED ORDER — ONDANSETRON HCL 4 MG/2ML IJ SOLN
INTRAMUSCULAR | Status: DC | PRN
  Administered 2015-01-20 (×2): 4 mg via INTRAVENOUS

## 2015-01-20 MED ORDER — EPHEDRINE SULFATE 50 MG/ML IJ SOLN
INTRAMUSCULAR | Status: DC | PRN
  Administered 2015-01-20: 5 mg via INTRAVENOUS
  Administered 2015-01-20: 10 mg via INTRAVENOUS
  Administered 2015-01-20 (×4): 5 mg via INTRAVENOUS

## 2015-01-20 MED ORDER — SODIUM CHLORIDE 0.9 % IR SOLN
Status: AC
  Filled 2015-01-20: qty 1

## 2015-01-20 MED ORDER — OXYCODONE-ACETAMINOPHEN 7.5-325 MG PO TABS: 1.0000 | ORAL_TABLET | ORAL | Status: DC | PRN

## 2015-01-20 MED ORDER — HYDROMORPHONE HCL 1 MG/ML IJ SOLN: 0.5000 mg | INTRAMUSCULAR | Status: DC | PRN

## 2015-01-20 MED ORDER — FLEET ENEMA 7-19 GM/118ML RE ENEM: 1.0000 | ENEMA | Freq: Once | RECTAL | Status: AC | PRN

## 2015-01-20 MED ORDER — ROCURONIUM BROMIDE 100 MG/10ML IV SOLN
INTRAVENOUS | Status: DC | PRN
  Administered 2015-01-20 (×2): 10 mg via INTRAVENOUS
  Administered 2015-01-20: 40 mg via INTRAVENOUS

## 2015-01-20 MED ORDER — ONDANSETRON HCL 4 MG/2ML IJ SOLN
4.0000 mg | INTRAMUSCULAR | Status: DC | PRN
  Administered 2015-01-21 (×2): 4 mg via INTRAVENOUS
  Filled 2015-01-20 (×2): qty 2

## 2015-01-20 MED ORDER — PHENYLEPHRINE HCL 10 MG/ML IJ SOLN
INTRAMUSCULAR | Status: DC | PRN
  Administered 2015-01-20 (×3): 40 ug via INTRAVENOUS
  Administered 2015-01-20 (×3): 80 ug via INTRAVENOUS
  Administered 2015-01-20: 40 ug via INTRAVENOUS
  Administered 2015-01-20 (×2): 80 ug via INTRAVENOUS

## 2015-01-20 MED ORDER — FENTANYL CITRATE 0.05 MG/ML IJ SOLN
INTRAMUSCULAR | Status: DC | PRN
  Administered 2015-01-20: 50 ug via INTRAVENOUS
  Administered 2015-01-20: 100 ug via INTRAVENOUS
  Administered 2015-01-20 (×4): 25 ug via INTRAVENOUS

## 2015-01-20 MED ORDER — SENNOSIDES-DOCUSATE SODIUM 8.6-50 MG PO TABS: 1.0000 | ORAL_TABLET | Freq: Every evening | ORAL | Status: DC | PRN

## 2015-01-20 MED ORDER — BISACODYL 5 MG PO TBEC
5.0000 mg | DELAYED_RELEASE_TABLET | Freq: Every day | ORAL | Status: DC | PRN
  Administered 2015-01-21: 5 mg via ORAL
  Filled 2015-01-20: qty 1

## 2015-01-20 MED ORDER — HYDROCHLOROTHIAZIDE 12.5 MG PO CAPS
12.5000 mg | ORAL_CAPSULE | Freq: Every day | ORAL | Status: DC
  Administered 2015-01-20 – 2015-01-21 (×2): 12.5 mg via ORAL
  Filled 2015-01-20 (×3): qty 1

## 2015-01-20 MED ORDER — VANCOMYCIN HCL IN DEXTROSE 1-5 GM/200ML-% IV SOLN: 1000.0000 mg | INTRAVENOUS | Status: DC

## 2015-01-20 MED ORDER — LIDOCAINE HCL (CARDIAC) 20 MG/ML IV SOLN
INTRAVENOUS | Status: DC | PRN
  Administered 2015-01-20: 50 mg via INTRAVENOUS

## 2015-01-20 MED ORDER — METHOCARBAMOL 500 MG PO TABS: 500.0000 mg | ORAL_TABLET | Freq: Four times a day (QID) | ORAL | Status: DC | PRN

## 2015-01-20 MED ORDER — CLOMIPHENE CITRATE 50 MG PO TABS: 25.0000 mg | ORAL_TABLET | Freq: Every day | ORAL | Status: DC

## 2015-01-20 MED ORDER — POTASSIUM GLUCONATE 595 (99 K) MG PO TABS
595.0000 mg | ORAL_TABLET | Freq: Every day | ORAL | Status: DC
  Administered 2015-01-20 – 2015-01-22 (×3): 595 mg via ORAL
  Filled 2015-01-20 (×3): qty 1

## 2015-01-20 MED ORDER — METHOCARBAMOL 1000 MG/10ML IJ SOLN
500.0000 mg | Freq: Four times a day (QID) | INTRAVENOUS | Status: DC | PRN
  Administered 2015-01-20: 500 mg via INTRAVENOUS
  Filled 2015-01-20 (×2): qty 5

## 2015-01-20 MED ORDER — SUCCINYLCHOLINE CHLORIDE 20 MG/ML IJ SOLN
INTRAMUSCULAR | Status: DC | PRN
  Administered 2015-01-20: 120 mg via INTRAVENOUS

## 2015-01-20 MED ORDER — SODIUM CHLORIDE 0.9 % IR SOLN
Status: DC | PRN
  Administered 2015-01-20: 500 mL

## 2015-01-20 SURGICAL SUPPLY — 46 items
BAG ZIPLOCK 12X15 (MISCELLANEOUS) IMPLANT
CHLORAPREP W/TINT 26ML (MISCELLANEOUS) IMPLANT
CLEANER TIP ELECTROSURG 2X2 (MISCELLANEOUS) ×3 IMPLANT
CLOSURE WOUND 1/2 X4 (GAUZE/BANDAGES/DRESSINGS)
CLOTH 2% CHLOROHEXIDINE 3PK (PERSONAL CARE ITEMS) ×3 IMPLANT
DRAPE MICROSCOPE LEICA (MISCELLANEOUS) ×3 IMPLANT
DRAPE POUCH INSTRU U-SHP 10X18 (DRAPES) ×3 IMPLANT
DRAPE SURG 17X11 SM STRL (DRAPES) ×3 IMPLANT
DRAPE UTILITY XL STRL (DRAPES) ×3 IMPLANT
DRSG AQUACEL AG ADV 3.5X 4 (GAUZE/BANDAGES/DRESSINGS) IMPLANT
DRSG AQUACEL AG ADV 3.5X 6 (GAUZE/BANDAGES/DRESSINGS) ×3 IMPLANT
DRSG TEGADERM 2-3/8X2-3/4 SM (GAUZE/BANDAGES/DRESSINGS) ×3 IMPLANT
DURAPREP 26ML APPLICATOR (WOUND CARE) ×3 IMPLANT
DURASEAL SPINE SEALANT 3ML (MISCELLANEOUS) IMPLANT
ELECT BLADE TIP CTD 4 INCH (ELECTRODE) IMPLANT
ELECT REM PT RETURN 9FT ADLT (ELECTROSURGICAL) ×3
ELECTRODE REM PT RTRN 9FT ADLT (ELECTROSURGICAL) ×1 IMPLANT
GLOVE BIOGEL PI IND STRL 7.5 (GLOVE) ×1 IMPLANT
GLOVE BIOGEL PI INDICATOR 7.5 (GLOVE) ×2
GLOVE SURG SS PI 7.5 STRL IVOR (GLOVE) ×3 IMPLANT
GLOVE SURG SS PI 8.0 STRL IVOR (GLOVE) ×6 IMPLANT
GOWN STRL REUS W/TWL XL LVL3 (GOWN DISPOSABLE) ×6 IMPLANT
IV CATH 14GX2 1/4 (CATHETERS) ×3 IMPLANT
KIT BASIN OR (CUSTOM PROCEDURE TRAY) ×3 IMPLANT
KIT POSITIONING SURG ANDREWS (MISCELLANEOUS) ×3 IMPLANT
MANIFOLD NEPTUNE II (INSTRUMENTS) ×3 IMPLANT
MARKER SKIN DUAL TIP RULER LAB (MISCELLANEOUS) ×3 IMPLANT
NEEDLE SPNL 18GX3.5 QUINCKE PK (NEEDLE) ×6 IMPLANT
PACK LAMINECTOMY ORTHO (CUSTOM PROCEDURE TRAY) ×3 IMPLANT
PATTIES SURGICAL .5 X.5 (GAUZE/BANDAGES/DRESSINGS) IMPLANT
PATTIES SURGICAL .75X.75 (GAUZE/BANDAGES/DRESSINGS) IMPLANT
PATTIES SURGICAL 1X1 (DISPOSABLE) IMPLANT
SPONGE SURGIFOAM ABS GEL 100 (HEMOSTASIS) ×3 IMPLANT
STRIP CLOSURE SKIN 1/2X4 (GAUZE/BANDAGES/DRESSINGS) IMPLANT
SUT NURALON 4 0 TR CR/8 (SUTURE) IMPLANT
SUT PROLENE 3 0 PS 2 (SUTURE) ×3 IMPLANT
SUT VIC AB 1 CT1 27 (SUTURE) ×4
SUT VIC AB 1 CT1 27XBRD ANTBC (SUTURE) ×2 IMPLANT
SUT VIC AB 1-0 CT2 27 (SUTURE) IMPLANT
SUT VIC AB 2-0 CT1 27 (SUTURE) ×6
SUT VIC AB 2-0 CT1 TAPERPNT 27 (SUTURE) ×3 IMPLANT
SUT VIC AB 2-0 CT2 27 (SUTURE) IMPLANT
SYR 3ML LL SCALE MARK (SYRINGE) ×3 IMPLANT
TOWEL OR 17X26 10 PK STRL BLUE (TOWEL DISPOSABLE) ×3 IMPLANT
TOWEL OR NON WOVEN STRL DISP B (DISPOSABLE) ×3 IMPLANT
YANKAUER SUCT BULB TIP NO VENT (SUCTIONS) IMPLANT

## 2015-01-20 NOTE — Interval H&P Note (Signed)
History and Physical Interval Note:  01/20/2015 7:32 AM  Ian [name]  has presented today for surgery, with the diagnosis of STENOSIS L4-5/L3-4  The various methods of treatment have been discussed with the patient and family. After consideration of risks, benefits and other options for treatment, the patient has consented to  Procedure(s): MICRO LUMBAR DECOMPRESSION L4-5/L3-4/POSSIBLE L2-3 (N/A) as a surgical intervention .  The patient's history has been reviewed, patient examined, no change in status, stable for surgery.  I have reviewed the patient's chart and labs.  Questions were answered to the patient's satisfaction.     [name]

## 2015-01-20 NOTE — Brief Op Note (Signed)
01/20/2015  10:15 AM  PATIENT:  Ian [name]  54 y.o. male  PRE-OPERATIVE DIAGNOSIS:  STENOSIS L4-5/L3-4  POST-OPERATIVE DIAGNOSIS:  STENOSIS L4-5/L3-4/L2-3  PROCEDURE:  Procedure(s): MICRO LUMBAR DECOMPRESSION L4-5/L3-4/L2-3 (N/A)  SURGEON:  Surgeon(s) and Role:    * Johnn Hai, MD - Primary  PHYSICIAN ASSISTANT:   ASSISTANTS: [place]   ANESTHESIA:   general  EBL:  Total I/O In: 2000 [I.V.:2000] Out: 400 [Urine:200; Blood:200]  BLOOD ADMINISTERED:none  DRAINS: none   LOCAL MEDICATIONS USED:  MARCAINE     SPECIMEN:  Source of Specimen:  L23  DISPOSITION OF SPECIMEN:  PATHOLOGY  COUNTS:  YES  TOURNIQUET:  * No tourniquets in log *  DICTATION: .Other Dictation: Dictation Number  [id number]  PLAN OF CARE: Admit to inpatient   PATIENT DISPOSITION:  PACU - hemodynamically stable.   Delay start of Pharmacological VTE agent (>24hrs) due to surgical blood loss or risk of bleeding: yes

## 2015-01-20 NOTE — H&P (View-Only) (Signed)
Ian [name] is an 54 y.o. male.   Chief Complaint: back and leg pain HPI: The patient is a 54 year old male who presents today for follow up of their back. The patient is being followed for their back pain. They are now 12 day(s) out from [facility] and 11 weeks out from when symptoms began. Symptoms reported today include: pain (left buttock and thigh). Current treatment includes: physical therapy and NSAIDs. The following medication has been used for pain control: Tylenol and Norco and Flexeril prn. The patient reports their current pain level to be mild (to severe. The pain is worse in the morning). The patient presents today following ESI (@ L4-5 left).  The patient follows up. Temporary relief from his injection. He is mainly complaining of radicular pain as opposed to the back pain.  Past Medical History  Diagnosis Date  . Internal bleeding hemorrhoids 2012    "might bleed once/month; only when I strain"  . Hyperlipidemia   . Clostridium difficile infection   . Testicular hypofunction   . Biceps tendon rupture     bilateral  . GERD (gastroesophageal reflux disease)   . Colon polyps   . Allergy   . Arthritis   . Hypertension   . Diabetes     Borderline/Pre-diabetes  . Leg cramps     Past Surgical History  Procedure Laterality Date  . Shoulder arthroscopy w/ rotator cuff repair  07/09/2012    left  . Appendectomy  2011  . Colonoscopy w/ biopsies and polypectomy  2012  . Shoulder arthroscopy  08/22/2012    Procedure: ARTHROSCOPY SHOULDER;  Surgeon: Marin Shutter, MD;  Location: [address];  Service: Orthopedics;  Laterality: Left;  Left shoulder arthroscopic lavage and debridement  . Shoulder arthroscopy  09/02/2012    Procedure: ARTHROSCOPY SHOULDER;  Surgeon: Marin Shutter, MD;  Location: [place];  Service: Orthopedics;  Laterality: Left;  Left shoulder arthroscopy irrigation and debridement  . Fissurectomy    . Excisional hemorrhoidectomy    . Internal sphincterotomy    . Hemorrhoid  surgery  06/24/2013  . Anal fissure repair  06/24/2013  . Wisdom tooth extraction    . Skin lesion excision      Benign    Family History  Problem Relation Age of Onset  . Heart disease Mother 36    Deceased  . Hypertension Mother   . Hyperlipidemia Mother   . Heart disease Father 64    Deceased  . CVA Father   . Hypertension Maternal Grandmother   . Hyperlipidemia Maternal Grandmother   . Cancer Maternal Grandmother     Colon Cancer  . Heart disease Maternal Grandfather   . Hypertension Maternal Grandfather   . Hyperlipidemia Maternal Grandfather   . Cancer Maternal Grandfather     Lung Cancer  . Asthma Paternal Grandmother   . Heart disease Paternal Grandfather    Social History:  reports that he has never smoked. He has never used smokeless tobacco. He reports that he drinks alcohol. He reports that he does not use illicit drugs.  Allergies:  Allergies  Allergen Reactions  . Tetanus Toxoids Anaphylaxis  . Daptomycin Other (See Comments)    myalgias  . Asa [Aspirin] Nausea Only and Rash  . Fenofibrate Other (See Comments)    Constipation w/anal fissure [Sx]  . Penicillins Rash  . Prednisone (Pak) [Prednisone] Other (See Comments)    Hyperactivity     (Not in a hospital admission)  No results found for  this or any previous visit (from the past 48 hour(s)). No results found.  Review of Systems  Constitutional: Negative.   HENT: Negative.   Eyes: Negative.   Respiratory: Negative.   Cardiovascular: Negative.   Gastrointestinal: Negative.   Genitourinary: Negative.   Musculoskeletal: Positive for back pain.  Skin: Negative.   Neurological: Positive for focal weakness.  Psychiatric/Behavioral: Negative.     There were no vitals taken for this visit. Physical Exam  Constitutional: He is oriented to person, place, and time. He appears well-developed and well-nourished.  HENT:  Head: Normocephalic and atraumatic.  Eyes: Conjunctivae and EOM are normal.  Pupils are equal, round, and reactive to light.  Neck: Normal range of motion. Neck supple.  Cardiovascular: Normal rate and regular rhythm.   Respiratory: Effort normal and breath sounds normal.  GI: Soft. Bowel sounds are normal.  Musculoskeletal:  On exam straight leg raise produces buttock, thigh, and calf pain on the left, negative on the right. He has some minor quad weakness and hip flexor weakness on the left compared to the right. Limited flexion and extension.  Lumbar spine exam reveals no evidence of soft tissue swelling, no evidence of soft tissue swelling or deformity or skin ecchymosis. On palpation there is no tenderness of the lumbar spine. No flank pain with percussion. The abdomen is soft and nontender. Nontender over the trochanters. No cellulitis or lymphadenopathy.  Motor is 5/5 including EHL, tibialis anterior, plantarflexion, and hamstrings. The patient is normoreflexic. There is no Babinski or clonus. Sensory exam is intact to light touch. The patient has good distal pulses. No DVT. No pain and normal range of motion without instability of the knees and ankles.   Neurological: He is alert and oriented to person, place, and time. He has normal reflexes.  Skin: Skin is warm and dry.  Psychiatric: He has a normal mood and affect.    He has had his myelogram, which indicates moderate to severe stenosis at T3, greater on the left than the right. Extruded disc material compressing the left L3 nerve root. At 3-4 disc degeneration severe and left subarticular extrusion compressing the left L4 nerve root. At 4-5 severe disc degeneration, moderate stenosis, lateral recess stenosis is noted.  Three view radiographs and review of his CT myelogram demonstrates no instability, stenosis noted at both these levels.   Assessment/Plan 1. 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 2. Multilevel disc degeneration.  We  had an extensive discussion concerning current pathology, relevant anatomy, and treatment options. His main pain is the left leg. The disc herniation at 2-3, 3-4, and possibly at 4-5 may be causative. We discussed decompression at those areas.  I had an extensive discussion of the risks and benefits of lumbar decompression with the patient including bleeding, infection, damage to neurovascular structures, epidural fibrosis, CSF leakage requiring repair. We also discussed increase in pain, adjacent segment disease, recurrent disc herniation, need for future surgery including repeat decompression and/or fusion. We also discussed risks of postoperative hematoma, paralysis, anesthetic complications including DVT, PE, death, cardiopulmonary dysfunction. In addition, the perioperative and postoperative courses were discussed in detail including the rehabilitative time and return to functional activity and work. I provided the patient with an illustrated handout and utilized the appropriate surgical models.  However, not perform multilevel fusion, therefore residual back pain if any. He reports knee pain is into the leg. We discussed restricting his activities afterwards. He would like to  stay on a mower. Sitting will be appropriate. We will proceed accordingly. Continue activity modifications, strategies to avoid reinjury. Obtain preoperative clearance from medical physician.  Plan microlumbar decompression L4-5, L3-4, possible L2-3  BISSELL, JACLYN M. PA-C for Dr. Tonita Cong 01/12/2015, 8:13 AM

## 2015-01-20 NOTE — Transfer of Care (Signed)
Immediate Anesthesia Transfer of Care Note  Patient: Ian [name]  Procedure(s) Performed: Procedure(s): MICRO LUMBAR DECOMPRESSION L4-5/L3-4/L2-3 (N/A)  Patient Location: PACU  Anesthesia Type:General  Level of Consciousness: awake, alert  and oriented  Airway & Oxygen Therapy: Patient Spontanous Breathing and Patient connected to face mask oxygen  Post-op Assessment: Report given to PACU RN  Post vital signs: Reviewed and stable  Complications: No apparent anesthesia complications

## 2015-01-20 NOTE — Anesthesia Postprocedure Evaluation (Signed)
  Anesthesia Post-op Note  Patient: Ian [name]  Procedure(s) Performed: Procedure(s) (LRB): MICRO LUMBAR DECOMPRESSION L4-5/L3-4/L2-3 (N/A)  Patient Location: PACU  Anesthesia Type: General  Level of Consciousness: awake and alert   Airway and Oxygen Therapy: Patient Spontanous Breathing  Post-op Pain: mild  Post-op Assessment: Post-op Vital signs reviewed, Patient's Cardiovascular Status Stable, Respiratory Function Stable, Patent Airway and No signs of Nausea or vomiting  Last Vitals:  Filed Vitals:   01/20/15 1518  BP: 119/76  Pulse: 79  Temp: 36.8 C  Resp: 16    Post-op Vital Signs: stable   Complications: No apparent anesthesia complications

## 2015-01-20 NOTE — Progress Notes (Signed)
Patient refused to take the vancomycin earlier today. Patient voiced concerns regarding the antibiotic. Notified B. Dixon, p.a.Marland Kitchenof patient concerns and intolerances of antibiotics. No new orders received. Informed patient of the plan. Patient accepted the plan of no antibiotic at this time.

## 2015-01-20 NOTE — Discharge Instructions (Signed)
Walk As Tolerated utilizing back precautions.  No bending, twisting, or lifting.  No driving for 2 weeks.   Aquacel dressing may remain in place until follow up. May shower with aquacel dressing in place. If the dressing peels off or becomes saturated, you may remove aquacel dressing and place gauze and tape dressing which should be kept clean and dry and changed daily. Do not remove steri-strips if they are present. See Dr. Tonita Cong in office in 14 days. Begin taking aspirin 81mg  per day starting 4 days after your surgery if not allergic to aspirin or on another blood thinner. Walk daily even outside. Use a cane or walker only if necessary. Avoid sitting on soft sofas.

## 2015-01-20 NOTE — Anesthesia Procedure Notes (Signed)
Procedure Name: Intubation Date/Time: 01/20/2015 7:43 AM Performed by: Jayra Choyce, Virgel Gess Pre-anesthesia Checklist: Patient identified, Emergency Drugs available, Suction available, Patient being monitored and Timeout performed Patient Re-evaluated:Patient Re-evaluated prior to inductionOxygen Delivery Method: Circle system utilized Preoxygenation: Pre-oxygenation with 100% oxygen Intubation Type: IV induction Ventilation: Mask ventilation without difficulty Laryngoscope Size: Mac and 4 Grade View: Grade I Tube type: Oral Tube size: 7.5 mm Number of attempts: 1 Airway Equipment and Method: Stylet Placement Confirmation: ETT inserted through vocal cords under direct vision,  positive ETCO2,  CO2 detector and breath sounds checked- equal and bilateral Secured at: 24 cm Tube secured with: Tape Dental Injury: Teeth and Oropharynx as per pre-operative assessment

## 2015-01-21 ENCOUNTER — Encounter (HOSPITAL_COMMUNITY): Payer: Self-pay | Admitting: Specialist

## 2015-01-21 DIAGNOSIS — M5126 Other intervertebral disc displacement, lumbar region: Secondary | ICD-10-CM | POA: Diagnosis not present

## 2015-01-21 LAB — COMPREHENSIVE METABOLIC PANEL
ALBUMIN: 3.2 g/dL — AB (ref 3.5–5.2)
ALK PHOS: 29 U/L — AB (ref 39–117)
ALT: 32 U/L (ref 0–53)
AST: 49 U/L — ABNORMAL HIGH (ref 0–37)
Anion gap: 8 (ref 5–15)
BUN: 14 mg/dL (ref 6–23)
CO2: 28 mmol/L (ref 19–32)
CREATININE: 1.03 mg/dL (ref 0.50–1.35)
Calcium: 9 mg/dL (ref 8.4–10.5)
Chloride: 98 mmol/L (ref 96–112)
GFR calc non Af Amer: 80 mL/min — ABNORMAL LOW (ref >90)
Glucose, Bld: 152 mg/dL — ABNORMAL HIGH (ref 70–99)
Potassium: 3.8 mmol/L (ref 3.5–5.1)
SODIUM: 134 mmol/L — AB (ref 135–145)
Total Bilirubin: 1.4 mg/dL — ABNORMAL HIGH (ref 0.3–1.2)
Total Protein: 5.7 g/dL — ABNORMAL LOW (ref 6.0–8.3)

## 2015-01-21 LAB — CBC
HCT: 42.1 % (ref 39.0–52.0)
Hemoglobin: 14.6 g/dL (ref 13.0–17.0)
MCH: 32.2 pg (ref 26.0–34.0)
MCHC: 34.7 g/dL (ref 30.0–36.0)
MCV: 92.9 fL (ref 78.0–100.0)
PLATELETS: 126 10*3/uL — AB (ref 150–400)
RBC: 4.53 MIL/uL (ref 4.22–5.81)
RDW: 13.7 % (ref 11.5–15.5)
WBC: 9.6 10*3/uL (ref 4.0–10.5)

## 2015-01-21 LAB — GLUCOSE, CAPILLARY
GLUCOSE-CAPILLARY: 167 mg/dL — AB (ref 70–99)
Glucose-Capillary: 145 mg/dL — ABNORMAL HIGH (ref 70–99)
Glucose-Capillary: 149 mg/dL — ABNORMAL HIGH (ref 70–99)
Glucose-Capillary: 158 mg/dL — ABNORMAL HIGH (ref 70–99)

## 2015-01-21 MED ORDER — FENOFIBRATE MICRONIZED 134 MG PO CAPS: 134.0000 mg | ORAL_CAPSULE | ORAL | Status: DC

## 2015-01-21 NOTE — Evaluation (Signed)
Physical Therapy Evaluation Patient Details Name: Ian [name] MRN: [id number] DOB: 1961/05/19 Today's Date: 01/21/2015   History of Present Illness  54 yo male s/p L2-L5 decompression 01/20/15.   Clinical Impression  On eval, pt required Min assist for mobility-able to ambulate ~85 feet with RW. Limited by pain but participated well. Plan is for d/c on tomorrow.     Follow Up Recommendations No PT follow up;Supervision/Assistance - 24 hour    Equipment Recommendations  Rolling walker with 5" wheels    Recommendations for Other Services OT consult     Precautions / Restrictions Precautions Precautions: Back Precaution Comments: Reviewed logroll technique and back precautions Restrictions Weight Bearing Restrictions: No      Mobility  Bed Mobility Overal bed mobility: Needs Assistance Bed Mobility: Sit to Sidelying;Rolling Rolling: Min guard       Sit to sidelying: Min guard General bed mobility comments: close guard for safety. VCs safety, technique.   Transfers Overall transfer level: Needs assistance Equipment used: Rolling walker (2 wheeled) Transfers: Sit to/from Stand Sit to Stand: Min assist         General transfer comment: small amount of assist to rise, stabilize. VCs safety, hand placement.  Ambulation/Gait Ambulation/Gait assistance: Min guard Ambulation Distance (Feet): 85 Feet Assistive device: Rolling walker (2 wheeled) Gait Pattern/deviations: Step-through pattern;Decreased stride length;Trunk flexed     General Gait Details: VCs safety, posture. slow gait speed. Increased L LE pain with increased distance.   Stairs            Wheelchair Mobility    Modified Rankin (Stroke Patients Only)       Balance                                             Pertinent Vitals/Pain Pain Assessment: 0-10 Pain Score: 7  Pain Location: back and L leg Pain Descriptors / Indicators: Aching;Sore    Home Living  Family/patient expects to be discharged to:: Private residence Living Arrangements: Spouse/significant other Available Help at Discharge: Family Type of Home: House Home Access: Stairs to enter Entrance Stairs-Rails: Left Entrance Stairs-Number of Steps: 4 Home Layout: One level Home Equipment: None      Prior Function Level of Independence: Independent               Hand Dominance        Extremity/Trunk Assessment   Upper Extremity Assessment: Overall WFL for tasks assessed           Lower Extremity Assessment: LLE deficits/detail   LLE Deficits / Details: pain with ambulation but strength WFL  Cervical / Trunk Assessment: Normal  Communication   Communication: No difficulties  Cognition Arousal/Alertness: Awake/alert Behavior During Therapy: WFL for tasks assessed/performed Overall Cognitive Status: Within Functional Limits for tasks assessed                      General Comments      Exercises        Assessment/Plan    PT Assessment Patient needs continued PT services  PT Diagnosis Difficulty walking;Acute pain   PT Problem List Decreased mobility;Decreased balance;Pain;Decreased knowledge of precautions;Decreased activity tolerance  PT Treatment Interventions DME instruction;Gait training;Stair training;Functional mobility training;Therapeutic activities;Therapeutic exercise;Patient/family education;Balance training   PT Goals (Current goals can be found in the Care Plan section) Acute Rehab PT Goals Patient Stated Goal:  less pain.  PT Goal Formulation: With patient/family Time For Goal Achievement: 01/28/15 Potential to Achieve Goals: Good    Frequency Min 5X/week   Barriers to discharge        Co-evaluation               End of Session   Activity Tolerance: Patient limited by pain Patient left: in bed;with call bell/phone within reach;with family/visitor present      Functional Assessment Tool Used: clinical  judgement Functional Limitation: Mobility: Walking and moving around Mobility: Walking and Moving Around Current Status (G9924): At least 1 percent but less than 20 percent impaired, limited or restricted Mobility: Walking and Moving Around Goal Status [phone]): At least 1 percent but less than 20 percent impaired, limited or restricted    Time: 1011-1030 PT Time Calculation (min) (ACUTE ONLY): 19 min   Charges:   PT Evaluation $Initial PT Evaluation Tier I: 1 Procedure     PT G Codes:   PT G-Codes **NOT FOR INPATIENT CLASS** Functional Assessment Tool Used: clinical judgement Functional Limitation: Mobility: Walking and moving around Mobility: Walking and Moving Around Current Status (H9622): At least 1 percent but less than 20 percent impaired, limited or restricted Mobility: Walking and Moving Around Goal Status [phone]): At least 1 percent but less than 20 percent impaired, limited or restricted    Weston Anna, MPT Pager: [phone]

## 2015-01-21 NOTE — Care Management Note (Addendum)
    Page 1 of 2   01/22/2015     12:48:33 PM CARE MANAGEMENT NOTE 01/22/2015  Patient:  Ian, [name]   Account Number:  [id number]  Date Initiated:  01/21/2015  Documentation initiated by:  [facility]  Subjective/Objective Assessment:   adm: MICRO LUMBAR DECOMPRESSION L4-5/L3-4/L2-3 (N/A)     Action/Plan:   discharge planning   Anticipated DC Date:  01/21/2015   Anticipated DC Plan:  [place]  CM consult      [facility] Choice  HOME HEALTH   Choice offered to / List presented to:  C-3 Spouse   DME arranged  Vassie Moselle      DME agency  [place] arranged  HH-1 RN      [place].   Status of service:  Completed, signed off Medicare Important Message given?   (If response is "NO", the following Medicare IM given date fields will be blank) Date Medicare IM given:   Medicare IM given by:   Date Additional Medicare IM given:   Additional Medicare IM given by:    Discharge Disposition:  [place]  Per UR Regulation:  Reviewed for med. necessity/level of care/duration of stay  If discussed at [place] of Stay Meetings, dates discussed:    Comments:  01-22-15 Sunday Spillers RN CM 1000 Per request, [facility] for safety evaluation. Spoke with patient and spouse at bedside. Have used [facility] in the past and would like to use them again. Contacted [facility] to arrange, orders entered.  01/21/15 15:30 CM met with pt in room to confirm his DME needs: Rolling walker.  CM called [facility] DME rep, Lecretia to please deliver rolling walker to room prior to discharge. No other CM needs were communicated.  Mariane Masters, BSN, Cm [phone].

## 2015-01-21 NOTE — Progress Notes (Signed)
CSW consulted for SNF placement. PN reviewed. PT does not recommend SNF . Pt plans to return home following hospital d/c. [place] signing off.  Werner Lean LCSW [phone]

## 2015-01-21 NOTE — Progress Notes (Signed)
Subjective: 1 Day Post-Op Procedure(s) (LRB): MICRO LUMBAR DECOMPRESSION L4-5/L3-4/L2-3 (N/A) Patient reports pain as 4 on 0-10 scale.    Objective: Vital signs in last 24 hours: Temp:  [97.8 F (36.6 C)-99.2 F (37.3 C)] 98.8 F (37.1 C) (01/28 0517) Pulse Rate:  [71-98] 83 (01/28 0517) Resp:  [13-17] 15 (01/28 0517) BP: (113-144)/(58-80) 128/80 mmHg (01/28 0517) SpO2:  [96 %-100 %] 97 % (01/28 0517) Weight:  [116.121 kg (256 lb)] 116.121 kg (256 lb) (01/27 1300)  Intake/Output from previous day: 01/27 0701 - 01/28 0700 In: 3822.5 [P.O.:720; I.V.:2702.5; IV Piggyback:400] Out: 3975 [Urine:3775; Blood:200] Intake/Output this shift:     Recent Labs  01/21/15 0543  HGB 14.6    Recent Labs  01/21/15 0543  WBC 9.6  RBC 4.53  HCT 42.1  PLT 126*    Recent Labs  01/21/15 0543  NA 134*  K 3.8  CL 98  CO2 28  BUN 14  CREATININE 1.03  GLUCOSE 152*  CALCIUM 9.0   No results for input(s): LABPT, INR in the last 72 hours.  Neurologically intact Sensation intact distally Dorsiflexion/Plantar flexion intact Incision: dressing C/D/I No cellulitis present  Assessment/Plan: 1 Day Post-Op Procedure(s) (LRB): MICRO LUMBAR DECOMPRESSION L4-5/L3-4/L2-3 (N/A) Advance diet Up with therapy D/C IV fluids Plan for discharge tomorrow  D/C foley Discussed with patient. Probable D/C tomorrow.  Ian [name] C 01/21/2015, 7:44 AM

## 2015-01-21 NOTE — Op Note (Signed)
NAMELUDGER, BONES NO.:  [id number]  MEDICAL RECORD NO.:  [id number]  LOCATION:  [id number]                         FACILITY:  [facility]  PHYSICIAN:  Susa Day, M.D.    DATE OF BIRTH:  1961/09/07  DATE OF PROCEDURE:  01/20/2015 DATE OF DISCHARGE:                              OPERATIVE REPORT   PREOPERATIVE DIAGNOSES:  Spinal stenosis, herniated nucleus pulposus, L2- 3, L3-4, and L4-5.  The patient had elevated BMI of 40.  POSTOPERATIVE DIAGNOSES:  Spinal stenosis, herniated nucleus pulposus, L2-3, L3-4, and L4-5.  PROCEDURES PERFORMED: 1. Microlumbar decompression, L2-3, L3-4 and L4-5. 2. Microdiskectomy, L2-3, left. 3. Foraminotomies of L3 and L4 bilaterally, L2 left. 4. The operative case difficulty was increased to the elevated BMI.  ANESTHESIA:  General.  ASSISTANT:  Cleophas Dunker, PA.  HISTORY:  This is a 54 year old male with severe left lower extremity radicular pain, L3-L4 nerve root distribution and history of disk herniation, L4-5.  He had multilevel disk degeneration, scoliosis.  His predominant complaint was left leg pain and L3-L4 nerve root distribution.  The myelogram upright indicating severe stenosis at L2-3 and then at L3-4, and disk herniation extruded compressing the L3 nerve root, disk herniation at L3-4 as well.  Migrating caudad.  Compressing the L4 nerve root, and lateral recess stenosis at L4-5, disk degeneration and scoliosis.  It was indicated for decompression at L2-3, L3-4 and possibly L4-5.  We decided to proceed with the central decompression to preserve the facets.  Did not have back pain. Discussed the risks and benefits including bleeding, infection, damage to neurovascular structures, DVT, PE, anesthetic complications, need for fusion in the future, etc.  TECHNIQUE:  With the patient in supine position, after induction of adequate anesthesia, and 1.5 g of vancomycin and 3 g of Kefzol, placed prone on the [place]  frame.  All bony prominences were well padded and lumbar region was prepped and draped in usual sterile fashion.  Two 18- gauge spinal needles were utilized to localize the L2, L3 and L4 spinous processes, confirmed with x-ray.  Incision was then made from the spinous process of L2 to below L4-5.  Subcutaneous tissue was dissected. Electrocautery was utilized to achieve hemostasis.  A 0.25% Marcaine with epinephrine was infiltrated in the perimuscular tissue.  There was for hemostasis.  The fascia lata was identified and divided in line with the skin incision.  Paraspinous muscle was elevated from lamina of L2-3, L3-4 and L4-5.  McCullough retractor was placed.  Operating microscope was draped and brought on the surgical field.  Next, a very small interlaminar windows throughout.  We felt that central decompression was the most appropriate to preserve the facets particularly with his scoliosis.  We removed the spinous processes of L3, L4 and L5 with Leksell rongeur.  Starting at L3-4 interspace, we used a 2-mm Kerrison and detached the ligamentum flavum from the caudad edge of L3 extending cephalad.  We removed the neural arch of L3.  Severe stenosis was noted at L2-3 secondary to ligamentum flavum hypertrophy and facet hypertrophy.  After removing the neural arch of L3, we also removed ligamentum flavum from L3-4.  There was significant stenosis noted  at L3- 4 as well and I then performed hemilaminotomies of the cephalad edge of L4 and continued with a central decompression.  We removed the lamina of L4 and removed the ligamentum flavum from the interspace at L4-5.  There was lateral recess stenosis at L4-5 and at L3-4 bilaterally.  We decompressed the lateral recesses to the medial border of the pedicle and we continued our decompression.  We performed hemilaminotomies of L2 bilaterally and on the left, there was severe stenosis of the L2-L3 nerve roots into the lateral recess  compressing the thecal sac, disk fragment noted.  Again, we decompressed the lateral recess to medial border of the pedicle and meticulously developed a plane between the thecal sac and the extruded fragment, which was encased in the epidural fibrosis, scar tissue.  Following this, I used an 18-gauge spinal needle and a nerve hook to breach the shell of extruded fragment, this was confirmed as disk herniation.  After performing the foraminotomies of L2 and L3 and protecting the thecal sac, we mobilized the extruded fragment with a nerve hook and a micropituitary.  Three or four large fragments were excised.  I then irrigated this space with catheter irrigation and additional small fragments were excised.  The disk was examined at 2-3 and it was degenerated and intact.  With osteophytes, I felt a foraminotomy to 2 and more of 3 preserving the pars and the facets.  We did not less than 25% of the facet was removed, undercut on the left. We then continued our decompression in the lateral recesses on the left decompressing L3-4 and down at L4-5, ligamentum flavum was removed bilaterally.  We did not removing the facets at L4-5.  I examined the disk space at L3-4.  There was a hardened disk and felt that the disk space was slightly caudad.  Following the decompression of L3 and L4 and foraminotomy of L4, a neuroprobe passed freely up the foramen of 3, 4 and 5 bilaterally as well as 2.  We obtained the confirmatory radiograph with a neuroprobe above the pedicle of 2 and through the foramen at 4-5. This was consistent with that seen on the CT myelogram showing the area of compression particularly on the left.  With the use of bone wax, electrocautery, and thrombin-soaked Gelfoam throughout to achieve hemostasis and was irrigated copiously with antibiotic irrigation throughout the case.  Next, we examined the decompression, there was no evidence of CSF leakage or active bleeding.  I placed  thrombin-soaked Gelfoam along the laminotomy defect.  I then released the [facility] retractor and meticulously inspected the paraspinous musculature using bipolar electrocautery to achieve hemostasis.  We then began suturing the dorsolumbar fascia with #1 Vicryl.  There was some mild bleeding. We reopened the paraspinous musculature and reinspected the area.  No active bleeding was noted.  Again using cautery and thrombin-soaked Gelfoam, we closed the dorsolumbar fascia with #1 Vicryl interrupted figure-of-eight sutures, subcu with multiple 2-0 layers and skin with staples.  Wound was dressed sterilely, was placed supine on the hospital bed, extubated without difficulty, and transported to the recovery room in satisfactory condition.  The patient tolerated the procedure well.  No complications.  Blood loss 20 mL.  Cleophas Dunker, PA was used for patient positioning, closure, gentle intermittent neuro traction.     Susa Day, M.D.     Geralynn Rile  D:  01/20/2015  T:  01/21/2015  Job:  [id number]

## 2015-01-21 NOTE — Evaluation (Signed)
Occupational Therapy Evaluation Patient Details Name: Ian [name] MRN: [id number] DOB: December 12, 1961 Today's Date: 01/21/2015    History of Present Illness 54 yo male s/p L2-L5 decompression 01/20/15.    Clinical Impression   Pt limited by 7/10 pain and had difficulty with transitions on and off comfort height commode with bar use. He is trying to decide if he would like a 3in1 (concerned about sitting comfortably on it and if he has the space for it over toilet) versus  If a riser and vanity is enough. Will further assess toilet transfers and DME needs. Wife present. Educated on AE options and wife plans to obtain kit. Will follow to progress ADLs.     Follow Up Recommendations  No OT follow up;Supervision/Assistance - 24 hour    Equipment Recommendations   (pt/spouse deciding on 3in1 versus obtaining a riser on their own.)    Recommendations for Other Services       Precautions / Restrictions Precautions Precautions: Back Precaution Booklet Issued: Yes (comment) Precaution Comments: Issued back care handout and reviewed with pt and wife. Reviewed all precautions. Restrictions Weight Bearing Restrictions: No      Mobility Bed Mobility Overal bed mobility: Needs Assistance Bed Mobility: Rolling;Sidelying to Sit Rolling: Min guard Sidelying to sit: Min guard     Sit to sidelying: Min guard General bed mobility comments: cues for log roll technique.  Transfers Overall transfer level: Needs assistance Equipment used: Rolling walker (2 wheeled) Transfers: Sit to/from Stand Sit to Stand: Min assist         General transfer comment: assist to rise especially from lower toilet surface as well as descend to commode. verbal cues back precautions.    Balance                                            ADL Overall ADL's : Needs assistance/impaired Eating/Feeding: Independent;Sitting   Grooming: Wash/dry hands;Set up;Sitting   Upper Body Bathing: Set  up;Supervision/ safety;Sitting   Lower Body Bathing: Moderate assistance;Sit to/from stand   Upper Body Dressing : Minimal assistance;Sitting   Lower Body Dressing: Maximal assistance;Sit to/from stand   Toilet Transfer: Minimal assistance;Ambulation;Comfort height toilet;RW;Grab bars   Toileting- Clothing Manipulation and Hygiene: Minimal assistance;Sit to/from stand         General ADL Comments: Pt with a 7/10 pain during session and had difficulty descending to comfort height commode with bar. He only has a standard height commode and vanity at home so explained he will need an elevated toilet surface. He isnt sure if a 3in1 will fit into his space and if he will be able to sit on it comfortably. He is considering a riser but he isnt sure if the vanity will be enough UE support and doesnt think a riser with handle will work either. Will need to try standard 3in1 with pt. Educated on AE options and wife ordred him 2 reachres. They are interested in AE kit and wife plans to obtain. He is currently not able to cross LEs up.     Vision                     Perception     Praxis      Pertinent Vitals/Pain Pain Assessment: 0-10 Pain Score: 7  Pain Location: back Pain Descriptors / Indicators: Aching Pain Intervention(s): Repositioned  Hand Dominance     Extremity/Trunk Assessment Upper Extremity Assessment Upper Extremity Assessment: Overall WFL for tasks assessed      Cervical / Trunk Assessment Cervical / Trunk Assessment: Normal   Communication Communication Communication: No difficulties   Cognition Arousal/Alertness: Awake/alert Behavior During Therapy: WFL for tasks assessed/performed Overall Cognitive Status: Within Functional Limits for tasks assessed                     General Comments       Exercises       Shoulder Instructions      Home Living Family/patient expects to be discharged to:: Private residence Living Arrangements:  Spouse/significant other Available Help at Discharge: Family Type of Home: House Home Access: Stairs to enter Technical brewer of Steps: 4 Entrance Stairs-Rails: Left Home Layout: One level     Bathroom Shower/Tub: Occupational psychologist: Standard     Home Equipment: Financial controller: Reacher Additional Comments: wife ordered 2 reachers      Prior Functioning/Environment Level of Independence: Independent             OT Diagnosis: Generalized weakness;Acute pain   OT Problem List: Decreased strength;Decreased knowledge of use of DME or AE;Decreased knowledge of precautions;Pain   OT Treatment/Interventions: Self-care/ADL training;Patient/family education;Therapeutic activities;DME and/or AE instruction    OT Goals(Current goals can be found in the care plan section) Acute Rehab OT Goals Patient Stated Goal: less pain.  OT Goal Formulation: With patient Time For Goal Achievement: 01/28/15 Potential to Achieve Goals: Good  OT Frequency: Min 2X/week   Barriers to D/C:            Co-evaluation              End of Session Equipment Utilized During Treatment: Rolling walker  Activity Tolerance: Patient limited by pain Patient left: in bed;with call bell/phone within reach;with family/visitor present   Time: 1107-1150 OT Time Calculation (min): 43 min Charges:  OT Evaluation $Initial OT Evaluation Tier I: 1 Procedure OT Treatments $Self Care/Home Management : 8-22 mins $Therapeutic Activity: 8-22 mins G-Codes: OT G-codes **NOT FOR INPATIENT CLASS** Functional Assessment Tool Used: clinical judgement Functional Limitation: Self care Self Care Current Status (K1840): At least 40 percent but less than 60 percent impaired, limited or restricted Self Care Goal Status (R7543): At least 1 percent but less than 20 percent impaired, limited or restricted  Col[place], Cylinder 01/21/2015, 1:06 PM

## 2015-01-21 NOTE — Progress Notes (Signed)
Charting for 01/20/14: Pt refused 1800 dose of Vancomycin. Although pt and wife were educated on the use of Vancomycin and the potential risk of infection for post surgical pts, they still refused medication. Pt stated "it makes me deathly ill". Will continue to monitor.

## 2015-01-22 DIAGNOSIS — M5126 Other intervertebral disc displacement, lumbar region: Secondary | ICD-10-CM | POA: Diagnosis not present

## 2015-01-22 LAB — GLUCOSE, CAPILLARY
GLUCOSE-CAPILLARY: 128 mg/dL — AB (ref 70–99)
Glucose-Capillary: 138 mg/dL — ABNORMAL HIGH (ref 70–99)

## 2015-01-22 MED ORDER — HYDROCODONE-ACETAMINOPHEN 10-325 MG PO TABS: 1.0000 | ORAL_TABLET | Freq: Four times a day (QID) | ORAL | Status: DC | PRN

## 2015-01-22 NOTE — Progress Notes (Signed)
Subjective: 2 Days Post-Op Procedure(s) (LRB): MICRO LUMBAR DECOMPRESSION L4-5/L3-4/L2-3 (N/A) Patient reports pain as moderate.  Reports pain improved from yesterday. Experiencing incisional back pain as well as some residual leg pain. Has been doing well with PT, getting OOB and walking. Voiding without difficulty. No BM yet. Hoping to go home today. He and his wife are requesting home OT for 1-2 visits to help them arrange some things at home.  Objective: Vital signs in last 24 hours: Temp:  [97.9 F (36.6 C)-99.6 F (37.6 C)] 98.9 F (37.2 C) (01/29 0600) Pulse Rate:  [78-85] 78 (01/29 0600) Resp:  [15-20] 20 (01/29 0600) BP: (106-119)/(63-71) 108/71 mmHg (01/29 0600) SpO2:  [93 %-96 %] 95 % (01/29 0600)  Intake/Output from previous day: 01/28 0701 - 01/29 0700 In: 720 [P.O.:720] Out: 1550 [Urine:1550] Intake/Output this shift:     Recent Labs  01/21/15 0543  HGB 14.6    Recent Labs  01/21/15 0543  WBC 9.6  RBC 4.53  HCT 42.1  PLT 126*    Recent Labs  01/21/15 0543  NA 134*  K 3.8  CL 98  CO2 28  BUN 14  CREATININE 1.03  GLUCOSE 152*  CALCIUM 9.0   No results for input(s): LABPT, INR in the last 72 hours.  Neurologically intact ABD soft Neurovascular intact Sensation intact distally Intact pulses distally Dorsiflexion/Plantar flexion intact Incision: dressing C/D/I and no drainage No cellulitis present Compartment soft no calf pain or sign of DVT  Assessment/Plan: 2 Days Post-Op Procedure(s) (LRB): MICRO LUMBAR DECOMPRESSION L4-5/L3-4/L2-3 (N/A) Advance diet Up with therapy D/C IV fluids  Order home health for OT needs Discussed D/C meds, instructions for dressing, spine precautions Will have new aquacel placed prior to D/C with tape on the bottom edge, as well as on the side if needed to prevent the dressing from rolling up After seen by PT and Dr. Tonita Cong this afternoon plan for D/C home later today Will discuss with Dr. Mliss Fritz,  Conley Rolls. 01/22/2015, 7:54 AM

## 2015-01-22 NOTE — Progress Notes (Signed)
Occupational Therapy Treatment Patient Details Name: Ian [name] MRN: [id number] DOB: [birth year] Today's Date: 01/22/2015    History of present illness 54 yo male s/p L2-L5 decompression 01/20/15.    OT comments  Pt progressing well; goals updated today. Family and pt would like HHOT--they have already talked to MD  Follow Up Recommendations  Home health OT;Supervision/Assistance - 24 hour    Equipment Recommendations  None recommended by OT (pt will borrow 3;1 and shower seat)    Recommendations for Other Services      Precautions / Restrictions Precautions Precautions: Back Restrictions Weight Bearing Restrictions: No       Mobility Bed Mobility             Sit to sidelying: Min assist General bed mobility comments: pt had pain. Light min A given to keep spine straight  Transfers   Equipment used: Rolling walker (2 wheeled) Transfers: Sit to/from Stand Sit to Stand: Min guard         General transfer comment: for safety    Balance                                   ADL                       Lower Body Dressing: Minimal assistance;Sit to/from stand;With adaptive equipment   Toilet Transfer: Minimal assistance;Ambulation;BSC       Tub/ Shower Transfer: Min guard;Ambulation     General ADL Comments: pt practiced with his AE kit for LB dressing.  Wife donned ted hose:  one with bag, one without.  Pt feels better sidestepping into shower stall, and this was safe.  Pt/wife would like HH to go to home for a visit or two      Vision                     Perception     Praxis      Cognition   Behavior During Therapy: [facility] for tasks assessed/performed Overall Cognitive Status: Within Functional Limits for tasks assessed                       Extremity/Trunk Assessment               Exercises     Shoulder Instructions       General Comments      Pertinent Vitals/ Pain       Pain Assessment:  0-10 Pain Score: 5  Pain Location: back Pain Descriptors / Indicators: Aching Pain Intervention(s): Limited activity within patient's tolerance;Monitored during session;Premedicated before session;Repositioned  Home Living Family/patient expects to be discharged to:: Private residence Living Arrangements: Spouse/significant other                                      Prior Functioning/Environment              Frequency       Progress Toward Goals  OT Goals(current goals can now be found in the care plan section)  Progress towards OT goals: Goals met and updated - see care plan  ADL Goals Pt Will Transfer to Toilet: with supervision;bedside commode;ambulating Pt Will Perform Toileting - Clothing Manipulation and hygiene: with supervision;sit to/from stand Pt Will Perform Tub/Shower Transfer: with  supervision;ambulating;shower seat;Shower transfer Additional ADL Goal #1: discontinued Additional ADL Goal #2: discontinued  Plan      Co-evaluation                 End of Session     Activity Tolerance Patient tolerated treatment well   Patient Left in bed;with call bell/phone within reach;with family/visitor present   Nurse Communication          Time: 0301-4996 OT Time Calculation (min): 34 min  Charges: OT General Charges $OT Visit: 1 Procedure OT Treatments $Self Care/Home Management : 23-37 mins  Jacquelyn Antony 01/22/2015, 8:53 AM Lesle Chris, OTR/L [phone] 01/22/2015

## 2015-01-22 NOTE — Discharge Summary (Signed)
Physician Discharge Summary   Patient ID: Ian [name] MRN: [id number] DOB/AGE: 1961-05-21 54 y.o.  Admit date: 01/20/2015 Discharge date: 01/22/2015  Primary Diagnosis:   STENOSIS L4-5/L3-4  Admission Diagnoses:  Past Medical History  Diagnosis Date  . Internal bleeding hemorrhoids 2012    "might bleed once/month; only when I strain"  . Hyperlipidemia   . Clostridium difficile infection 2014  . Testicular hypofunction   . Biceps tendon rupture     bilateral  . GERD (gastroesophageal reflux disease)   . Colon polyps   . Allergy   . Arthritis   . Hypertension   . Diabetes     Borderline/Pre-diabetes  . Leg cramps   . Internal prolapsed hemorrhoids    Discharge Diagnoses:   Principal Problem:   Spinal stenosis of lumbar region Active Problems:   Spinal stenosis of lumbar region at multiple levels  Procedure:  Procedure(s) (LRB): MICRO LUMBAR DECOMPRESSION L4-5/L3-4/L2-3 (N/A)   Consults: None  HPI:  see H&P    Laboratory Data: Office Visit on 01/01/2015  Component Date Value Ref Range Status  . WBC 01/01/2015 6.3  4.0 - 10.5 K/uL Final  . RBC 01/01/2015 5.09  4.22 - 5.81 Mil/uL Final  . Platelets 01/01/2015 114.0* 150.0 - 400.0 K/uL Final  . Hemoglobin 01/01/2015 16.2  13.0 - 17.0 g/dL Final  . HCT 01/01/2015 47.5  39.0 - 52.0 % Final  . MCV 01/01/2015 93.4  78.0 - 100.0 fl Final  . MCHC 01/01/2015 34.1  30.0 - 36.0 g/dL Final  . RDW 01/01/2015 13.8  11.5 - 15.5 % Final  . Sodium 01/01/2015 139  135 - 145 mEq/L Final  . Potassium 01/01/2015 4.2  3.5 - 5.1 mEq/L Final  . Chloride 01/01/2015 103  96 - 112 mEq/L Final  . CO2 01/01/2015 29  19 - 32 mEq/L Final  . Glucose, Bld 01/01/2015 103* 70 - 99 mg/dL Final  . BUN 01/01/2015 21  6 - 23 mg/dL Final  . Creatinine, Ser 01/01/2015 1.3  0.4 - 1.5 mg/dL Final  . Calcium 01/01/2015 9.2  8.4 - 10.5 mg/dL Final  . GFR 01/01/2015 63.66  >60.00 mL/min Final  . INR 01/01/2015 1.0  0.8 - 1.0 ratio Final  .  Prothrombin Time 01/01/2015 10.9  9.6 - 13.1 sec Final  . Colony Count 01/01/2015 NO GROWTH   Final  . Organism ID, Bacteria 01/01/2015 NO GROWTH   Final    Recent Labs  01/21/15 0543  HGB 14.6    Recent Labs  01/21/15 0543  WBC 9.6  RBC 4.53  HCT 42.1  PLT 126*    Recent Labs  01/21/15 0543  NA 134*  K 3.8  CL 98  CO2 28  BUN 14  CREATININE 1.03  GLUCOSE 152*  CALCIUM 9.0   No results for input(s): LABPT, INR in the last 72 hours.  X-Rays:Dg Chest 2 View  01/14/2015   CLINICAL DATA:  Hypertension.  EXAM: CHEST  2 VIEW  COMPARISON:  09/02/2012.  FINDINGS: Mediastinum and hilar structures normal. Lungs are clear. Heart size normal. No pleural effusion or pneumothorax. Bony structures are normal.  IMPRESSION: No acute cardiopulmonary disease.   Electronically Signed   By: Marcello Moores  Register   On: 01/14/2015 10:03   Dg Lumbar Spine 2-3 Views  01/14/2015   CLINICAL DATA:  Preoperative evaluation for lumbar micro discectomy and decompression L4-L5, possibly L3-L4, lumbar spinal stenosis  EXAM: LUMBAR SPINE - 2-3 VIEW  COMPARISON:  CT myelogram 11/27/2014  FINDINGS: Preceding CT myelogram labeled with 5 lumbar vertebra, current radiographs labeled similarly.  Hypoplastic last rib pair.  Multilevel disc space narrowing and endplate spur formation throughout lumbar region.  Vertebral body heights maintained without fracture or subluxation.  Broad based dextro convex lumbar scoliosis apex L2.  No gross evidence of spondylolysis.  SI joints and hip joints symmetric and preserved.  IMPRESSION: Degenerative disc disease changes lumbar spine with dextro convex scoliosis apex L2.   Electronically Signed   By: Lavonia Dana M.D.   On: 01/14/2015 10:03   Dg Spine Portable 1 View  01/20/2015   CLINICAL DATA:  54 year old male undergoing lumbar surgery. Initial encounter.  EXAM: PORTABLE SPINE - 1 VIEW  COMPARISON:  0851 hr the same day and earlier.  FINDINGS: Intraoperative portable cross-table  lateral view of the lumbar spine labeled image number 4 at 0934 hr.  Two surgical probes project at the L3 pedicle level. A single Surgical probe partially projects over the L5 pedicle.  IMPRESSION: Intraoperative localization as above.   Electronically Signed   By: Lars Pinks M.D.   On: 01/20/2015 09:49   Dg Spine Portable 1 View  01/20/2015   CLINICAL DATA:  Intraoperative localization film. Lumbar spine surgery L2-L5.  EXAM: PORTABLE SPINE - 1 VIEW  COMPARISON:  Intraoperative localization film 01/20/2015 at 8:09 a.m.  FINDINGS: Clamps are in place on the L3 and L4 spinous processes. Probes are identified at the level of the L2 and L4 pedicles.  IMPRESSION: Localization as above.   Electronically Signed   By: Inge Rise M.D.   On: 01/20/2015 09:06   Dg Spine Portable 1 View  01/20/2015   CLINICAL DATA:  54 year old male undergoing lumbar spine surgery. Initial encounter.  EXAM: PORTABLE SPINE - 1 VIEW  COMPARISON:  Intraoperative image 0 739 hr today. Preoperative films 01/14/2015.  FINDINGS: Same numbering system as on the comparisons. Full size ribs at T11 again noted.  Intraoperative portable cross-table lateral view of the lumbar spine labeled image #2 at 0809 hrs.  Cephalad Surgical clamp or probe at the L2 spinous process.  Middle Surgical clamps or probes at the L3 spinous process.  Caudal Surgical clamp or probe at the L4 spinous process.  IMPRESSION: Intraoperative localization as above.   Electronically Signed   By: Lars Pinks M.D.   On: 01/20/2015 08:27   Dg Spine Portable 1 View  01/20/2015   CLINICAL DATA:  Surgical levels for operative reference  EXAM: PORTABLE SPINE - 1 VIEW  COMPARISON:  01/14/2015  FINDINGS: A single portable lateral view of the lumbar spine was used to enumerate the lumbar levels posteriorly. A needle is visible between the L1 and L2 spinous processes and also between the L3-L4 spinous processes.  IMPRESSION: Posterior localization as detailed above   Electronically  Signed   By: Andreas Newport M.D.   On: 01/20/2015 08:14    EKG: Orders placed or performed in visit on 01/01/15  . EKG 12-Lead     Hospital Course: Patient was admitted to [facility] and taken to the OR and underwent the above state procedure without complications.  Patient tolerated the procedure well and was later transferred to the recovery room and then to the orthopaedic floor for postoperative care.  They were given PO and IV analgesics for pain control following their surgery.  They were given 24 hours of postoperative antibiotics.   PT was consulted postop to assist with mobility and transfers.  The patient was allowed  to be WBAT with therapy and was taught back precautions. Discharge planning was consulted to help with postop disposition and equipment needs.  Patient had a good night on the evening of surgery and started to get up OOB with therapy on day one. Patient was seen in rounds and was ready to go home on day two.  They were given discharge instructions and dressing directions.  They were instructed on when to follow up in the office with Dr. Tonita Cong.   Diet: Diabetic diet Activity:WBAT; Lspine precautions Follow-up:in 14 days Disposition - Home with home health OT Discharged Condition: good   Discharge Instructions    Call MD / Call 911    Complete by:  As directed   If you experience chest pain or shortness of breath, CALL 911 and be transported to the hospital emergency room.  If you develope a fever above 101 F, pus (white drainage) or increased drainage or redness at the wound, or calf pain, call your surgeon's office.     Constipation Prevention    Complete by:  As directed   Drink plenty of fluids.  Prune juice may be helpful.  You may use a stool softener, such as Colace (over the counter) 100 mg twice a day.  Use MiraLax (over the counter) for constipation as needed.     Diet - low sodium heart healthy    Complete by:  As directed      Increase activity  slowly as tolerated    Complete by:  As directed             Medication List    STOP taking these medications        cyclobenzaprine 10 MG tablet  Commonly known as:  FLEXERIL      TAKE these medications        ALLERGIST [name]  Generic drug:  Tuberculin-Allergy Syringes  Inject 0.1 mLs into the skin once a week.     clomiPHENE 50 MG tablet  Commonly known as:  CLOMID  Take 25 mg by mouth daily.     docusate sodium 100 MG capsule  Commonly known as:  COLACE  Take 1 capsule (100 mg total) by mouth 2 (two) times daily as needed for mild constipation.     EQ FIBER POWDER PO  Take 2 each by mouth 3 (three) times daily.     fenofibrate micronized 134 MG capsule  Commonly known as:  LOFIBRA  Take 1 capsule (134 mg total) by mouth every other day.     glucose blood test strip  RELION BLOOD GLUCOSE TEST STRIP Use as instructed to test daily Dx: 250.00     HYDROcodone-acetaminophen 10-325 MG per tablet  Commonly known as:  NORCO  Take 1-2 tablets by mouth every 6 (six) hours as needed (Pain).     ibuprofen 200 MG tablet  Commonly known as:  ADVIL,MOTRIN  Take 400-600 mg by mouth every 6 (six) hours as needed (Pain).     lisinopril-hydrochlorothiazide 10-12.5 MG per tablet  Commonly known as:  PRINZIDE,ZESTORETIC  Take 1 tablet by mouth 2 (two) times daily.     methocarbamol 500 MG tablet  Commonly known as:  ROBAXIN  Take 1 tablet (500 mg total) by mouth every 8 (eight) hours as needed for muscle spasms.     omega-3 acid ethyl esters 1 G capsule  Commonly known as:  LOVAZA  Take 2 capsules (2 g total) by mouth  2 (two) times daily.     omeprazole 20 MG capsule  Commonly known as:  PRILOSEC  Take 1 capsule (20 mg total) by mouth daily.     Pitavastatin Calcium 4 MG Tabs  Commonly known as:  LIVALO  Take 1 tablet (4 mg total) by mouth daily.     potassium gluconate 595 MG Tabs tablet  Take 595 mg by mouth daily.     RELION  CONFIRM GLUCOSE MONITOR W/DEVICE Kit  USE AS DIRECTED TO TEST BLOOD GLUCOSE DAILY DX: 250.00     UNABLE TO FIND  FIBER POWDER TAKES 2 TEASPOONS IN GLASS PER DAY TAKES 2 TO 3 TIMES PER DAY     [name]  Take 1 tablet by mouth daily.           Follow-up Information    Follow up with BEANE,JEFFREY C, MD In 2 weeks.   Specialty:  Orthopedic Surgery   Why:  For suture removal   Contact information:   [address] [place] [address] [phone]       Follow up with [facility].   Why:  rolling walker    Contact information:   [place] [address] [phone]       Signed: Lacie Draft, PA-C for Dr. Tonita Cong Orthopaedic Surgery 01/22/2015, 12:15 PM

## 2015-01-22 NOTE — Progress Notes (Signed)
Physical Therapy Treatment Patient Details Name: Ian [name] MRN: [id number] DOB: 1961-06-26 Today's Date: 01/22/2015    History of Present Illness 54 yo male s/p L2-L5 decompression 01/20/15.     PT Comments    Mobilizing well. Pain 2/10 at rest, 5/10 with activity. Reviewed/practiced bed mobility, ambulation and stair negotiation. Discussed car transfer. All education completed.   Follow Up Recommendations  No PT follow up;Supervision/Assistance - 24 hour     Equipment Recommendations  Rolling walker with 5" wheels    Recommendations for Other Services OT consult     Precautions / Restrictions Precautions Precautions: Back Precaution Comments: Reviewed back precautions and logroll technique. pt able to recall 3/4 precautions.  Restrictions Weight Bearing Restrictions: No    Mobility  Bed Mobility   Bed Mobility: Rolling;Sidelying to Sit Rolling: Supervision Sidelying to sit: Supervision     Sit to sidelying: Min assist General bed mobility comments: VCs safety, technique, adherence to precautions  Transfers Overall transfer level: Needs assistance Equipment used: Rolling walker (2 wheeled) Transfers: Sit to/from Stand Sit to Stand: From elevated surface;Supervision         General transfer comment: VCS safety, technique, hand placement  Ambulation/Gait Ambulation/Gait assistance: Supervision Ambulation Distance (Feet): 150 Feet Assistive device: Rolling walker (2 wheeled) Gait Pattern/deviations: Step-through pattern;Decreased stride length     General Gait Details: VCs safety,posture.    Stairs Stairs: Yes Stairs assistance: Supervision Stair Management: Step to pattern;Forwards;One rail Left Number of Stairs: 2 General stair comments: VCS safety, technique, sequence, adherence to precautions. Wife present to observe as well  Wheelchair Mobility    Modified Rankin (Stroke Patients Only)       Balance                                    Cognition Arousal/Alertness: Awake/alert Behavior During Therapy: WFL for tasks assessed/performed Overall Cognitive Status: Within Functional Limits for tasks assessed                      Exercises      General Comments        Pertinent Vitals/Pain Pain Assessment: 0-10 Pain Score: 5  Pain Location: back, L LE Pain Descriptors / Indicators: Aching Pain Intervention(s): Limited activity within patient's tolerance;Monitored during session;Repositioned    Home Living Family/patient expects to be discharged to:: Private residence Living Arrangements: Spouse/significant other                  Prior Function            PT Goals (current goals can now be found in the care plan section) Progress towards PT goals: Progressing toward goals    Frequency  Min 5X/week    PT Plan Current plan remains appropriate    Co-evaluation             End of Session   Activity Tolerance: Patient tolerated treatment well Patient left: in bed;with family/visitor present;with call bell/phone within reach (sitting at EOB-nursing to change dressing)     Time: 4967-5916 PT Time Calculation (min) (ACUTE ONLY): 22 min  Charges:  $Gait Training: 8-22 mins                    G Codes:  Functional Assessment Tool Used: clinical judgement Functional Limitation: Mobility: Walking and moving around Mobility: Walking and Moving Around Current Status [phone]): At least 1 percent  but less than 20 percent impaired, limited or restricted Mobility: Walking and Moving Around Goal Status [phone]): At least 1 percent but less than 20 percent impaired, limited or restricted Mobility: Walking and Moving Around Discharge Status [phone]): At least 1 percent but less than 20 percent impaired, limited or restricted   Weston Anna, MPT Pager: [phone]

## 2015-01-30 ENCOUNTER — Other Ambulatory Visit: Payer: Self-pay | Admitting: Physician Assistant

## 2015-02-01 NOTE — Telephone Encounter (Signed)
Rx request to pharmacy/SLS  

## 2015-02-14 ENCOUNTER — Other Ambulatory Visit: Payer: Self-pay | Admitting: Physician Assistant

## 2015-02-15 NOTE — Telephone Encounter (Signed)
Medication Detail      Disp Refills Start End     LIVALO 4 MG TABS 30 tablet 3 02/01/2015     Sig: TAKE 1 TABLET (4 MG TOTAL) BY MOUTH DAILY.    E-Prescribing Status: Receipt confirmed by pharmacy (02/01/2015 7:23 AM EST)   Pharmacy    [id number] - [place], [place] - [address] AT [address]OSS FROM [address]   LAST RX TO PHARMACY 02.08.16, #30 with 3 refills, refills remaining; Rx request Denied/SLS

## 2015-02-22 ENCOUNTER — Ambulatory Visit: Payer: BC Managed Care – PPO | Admitting: Physician Assistant

## 2015-02-26 ENCOUNTER — Ambulatory Visit: Payer: BLUE CROSS/BLUE SHIELD | Admitting: Physician Assistant

## 2015-03-08 ENCOUNTER — Encounter: Payer: Self-pay | Admitting: Physician Assistant

## 2015-03-08 ENCOUNTER — Ambulatory Visit (INDEPENDENT_AMBULATORY_CARE_PROVIDER_SITE_OTHER): Payer: BLUE CROSS/BLUE SHIELD | Admitting: Physician Assistant

## 2015-03-08 VITALS — BP 104/92 | HR 75 | Temp 98.5°F | Resp 16 | Ht 68.0 in | Wt 268.1 lb

## 2015-03-08 DIAGNOSIS — E781 Pure hyperglyceridemia: Secondary | ICD-10-CM

## 2015-03-08 DIAGNOSIS — E119 Type 2 diabetes mellitus without complications: Secondary | ICD-10-CM

## 2015-03-08 DIAGNOSIS — H66003 Acute suppurative otitis media without spontaneous rupture of ear drum, bilateral: Secondary | ICD-10-CM

## 2015-03-08 DIAGNOSIS — H66009 Acute suppurative otitis media without spontaneous rupture of ear drum, unspecified ear: Secondary | ICD-10-CM | POA: Insufficient documentation

## 2015-03-08 DIAGNOSIS — R6889 Other general symptoms and signs: Secondary | ICD-10-CM

## 2015-03-08 DIAGNOSIS — K219 Gastro-esophageal reflux disease without esophagitis: Secondary | ICD-10-CM

## 2015-03-08 DIAGNOSIS — J069 Acute upper respiratory infection, unspecified: Secondary | ICD-10-CM

## 2015-03-08 LAB — COMPREHENSIVE METABOLIC PANEL WITH GFR
ALT: 27 U/L (ref 0–53)
AST: 38 U/L — ABNORMAL HIGH (ref 0–37)
Albumin: 3.8 g/dL (ref 3.5–5.2)
Alkaline Phosphatase: 36 U/L — ABNORMAL LOW (ref 39–117)
BUN: 16 mg/dL (ref 6–23)
CO2: 28 meq/L (ref 19–32)
Calcium: 8.9 mg/dL (ref 8.4–10.5)
Chloride: 100 meq/L (ref 96–112)
Creatinine, Ser: 1.02 mg/dL (ref 0.40–1.50)
GFR: 80.45 mL/min
Glucose, Bld: 99 mg/dL (ref 70–99)
Potassium: 3.3 meq/L — ABNORMAL LOW (ref 3.5–5.1)
Sodium: 134 meq/L — ABNORMAL LOW (ref 135–145)
Total Bilirubin: 0.7 mg/dL (ref 0.2–1.2)
Total Protein: 6.6 g/dL (ref 6.0–8.3)

## 2015-03-08 LAB — LIPID PANEL
Cholesterol: 181 mg/dL (ref 0–200)
HDL: 23.3 mg/dL — ABNORMAL LOW (ref >39.00)
Total CHOL/HDL Ratio: 8

## 2015-03-08 LAB — LDL CHOLESTEROL, DIRECT: Direct LDL: 43 mg/dL

## 2015-03-08 LAB — POCT INFLUENZA A/B
INFLUENZA B, POC: NEGATIVE
Influenza A, POC: NEGATIVE

## 2015-03-08 MED ORDER — OMEPRAZOLE 20 MG PO CPDR: 20.0000 mg | DELAYED_RELEASE_CAPSULE | Freq: Every day | ORAL | Status: DC

## 2015-03-08 MED ORDER — AZITHROMYCIN 250 MG PO TABS: ORAL_TABLET | ORAL | Status: DC

## 2015-03-08 MED ORDER — FLUTICASONE PROPIONATE 50 MCG/ACT NA SUSP: 2.0000 | Freq: Every day | NASAL | Status: DC

## 2015-03-08 NOTE — Assessment & Plan Note (Signed)
Stable. Medication refill per patient request.

## 2015-03-08 NOTE — Assessment & Plan Note (Signed)
We'll obtain repeat fasting lipid panel today to reassess triglycerides. Continue current regimen for now.

## 2015-03-08 NOTE — Patient Instructions (Signed)
Your flu swab is negative.  You have a bacterial ear infection.  Please take the Azithromycin as directed.  Stay well hydrated. Use Mucinex for congestion. Use the Flonase daily as directed.  Get plenty of rest and place a humidifier in the bedroom.  Call or return to clinic if symptoms are not improving.  I will call you with all of your results.

## 2015-03-08 NOTE — Progress Notes (Signed)
Pre visit review using our clinic review tool, if applicable. No additional management support is needed unless otherwise documented below in the visit note/SLS  

## 2015-03-08 NOTE — Assessment & Plan Note (Signed)
Patient is penicillin allergic. Rx azithromycin. Supportive measures discussed. Return precautions discussed with patient.

## 2015-03-08 NOTE — Assessment & Plan Note (Signed)
Supportive measures discussed. Plain Mucinex. Rx Flonase. Increase fluids. Rest. Humidifier in bedroom. Patient presently with bacterial ear infection that is being treated (see assessment and plan). Return precautions discussed with patient.

## 2015-03-08 NOTE — Progress Notes (Signed)
Patient presents to clinic today for follow-up of hypertriglyceridemia. Patient endorses he continues to take his left eyebrow every other day. Denies constipation or other side effects. Is fasting for labs today.  Patient complains of 2 days of head and chest congestion, sinus pressure, bilateral ear pressure and pain with postnasal drip. Endorses mild cough that is rarely productive. Denies fever, chills. Endorses some aches. Has had flu shot this past year. Denies recent travel or sick contact.  Past Medical History  Diagnosis Date  . Internal bleeding hemorrhoids 2012    "might bleed once/month; only when I strain"  . Hyperlipidemia   . Clostridium difficile infection 2014  . Testicular hypofunction   . Biceps tendon rupture     bilateral  . GERD (gastroesophageal reflux disease)   . Colon polyps   . Allergy   . Arthritis   . Hypertension   . Diabetes     Borderline/Pre-diabetes  . Leg cramps   . Internal prolapsed hemorrhoids     Current Outpatient Prescriptions on File Prior to Visit  Medication Sig Dispense Refill  . ALLERGIST [name] Inject 0.1 mLs into the skin once a week.   6  . Blood Glucose Monitoring Suppl (RELION CONFIRM GLUCOSE MONITOR) W/DEVICE KIT USE AS DIRECTED TO TEST BLOOD GLUCOSE DAILY DX: 250.00 1 kit 0  . Cholecalciferol (VITAMIN D-3) 5000 UNITS TABS Take 1 tablet by mouth daily.    . clomiPHENE (CLOMID) 50 MG tablet Take 25 mg by mouth daily.    Marland Kitchen docusate sodium (COLACE) 100 MG capsule Take 1 capsule (100 mg total) by mouth 2 (two) times daily as needed for mild constipation. 20 capsule 1  . fenofibrate micronized (LOFIBRA) 134 MG capsule Take 1 capsule (134 mg total) by mouth every other day. 30 capsule 1  . glucose blood test strip RELION BLOOD GLUCOSE TEST STRIP Use as instructed to test daily Dx: 250.00 50 each 11  . HYDROcodone-acetaminophen (NORCO) 10-325 MG per tablet Take 1-2 tablets by mouth every 6 (six) hours  as needed (Pain). 60 tablet 0  . ibuprofen (ADVIL,MOTRIN) 200 MG tablet Take 400-600 mg by mouth every 6 (six) hours as needed (Pain).    Marland Kitchen lisinopril-hydrochlorothiazide (PRINZIDE,ZESTORETIC) 10-12.5 MG per tablet Take 1 tablet by mouth daily.     Marland Kitchen LIVALO 4 MG TABS TAKE 1 TABLET (4 MG TOTAL) BY MOUTH DAILY. 30 tablet 3  . methocarbamol (ROBAXIN) 500 MG tablet Take 1 tablet (500 mg total) by mouth every 8 (eight) hours as needed for muscle spasms. 60 tablet 1  . omega-3 acid ethyl esters (LOVAZA) 1 G capsule Take 2 capsules (2 g total) by mouth 2 (two) times daily. (Patient taking differently: Take 2 g by mouth 2 (two) times daily. Patient reports has been taking [2] once daily) 120 capsule 1  . potassium gluconate 595 MG TABS tablet Take 595 mg by mouth daily.    . Wheat Dextrin (EQ FIBER POWDER PO) Take 2 each by mouth 3 (three) times daily.      No current facility-administered medications on file prior to visit.    Allergies  Allergen Reactions  . Tetanus Toxoids Anaphylaxis  . Daptomycin Other (See Comments)    myalgias  . Citrus Diarrhea    All citrus fruits  . Milk-Related Compounds Diarrhea    Milk and milk products  . Oxycodone   . Asa [Aspirin] Nausea Only and Rash  . Fenofibrate Other (See Comments)  Constipation w/anal fissure [Sx]  . Penicillins Rash  . Prednisone (Pak) [Prednisone] Other (See Comments)    Hyperactivity WITH ORAL, CAN TAKE SHOTS    Family History  Problem Relation Age of Onset  . Heart disease Mother 16    Deceased  . Hypertension Mother   . Hyperlipidemia Mother   . Heart disease Father 26    Deceased  . CVA Father   . Hypertension Maternal Grandmother   . Hyperlipidemia Maternal Grandmother   . Cancer Maternal Grandmother     Colon Cancer  . Heart disease Maternal Grandfather   . Hypertension Maternal Grandfather   . Hyperlipidemia Maternal Grandfather   . Cancer Maternal Grandfather     Lung Cancer  . Asthma Paternal Grandmother   .  Heart disease Paternal Grandfather     History   Social History  . Marital Status: Married    Spouse Name: N/A  . Number of Children: N/A  . Years of Education: N/A   Social History Main Topics  . Smoking status: Never Smoker   . Smokeless tobacco: Never Used  . Alcohol Use: No  . Drug Use: No  . Sexual Activity: Not Currently   Other Topics Concern  . None   Social History Narrative   Marital Status: Married Pamala Hurry)    Children:  Step Daughter Sharyn Lull)    Pets: None    Living Situation: Lives with Pamala Hurry    Occupation: [employer] ([place])    Education: Associate's Degree in [employer]    Tobacco Use/Exposure:  None    Alcohol Use:  Rarely    Drug Use:  None   Diet:  Regular   Exercise: Limited     Hobbies:  Hunting, Fishing              Review of Systems - See HPI.  All other ROS are negative.  BP 104/92 mmHg  Pulse 75  Temp(Src) 98.5 F (36.9 C) (Oral)  Resp 16  Ht 5' 8"  (1.727 m)  Wt 268 lb 2 oz (121.621 kg)  BMI 40.78 kg/m2  SpO2 97%  Physical Exam  Constitutional: He is oriented to person, place, and time and well-developed, well-nourished, and in no distress.  HENT:  Head: Normocephalic and atraumatic.  Right Ear: External ear and ear canal normal. Tympanic membrane is erythematous and bulging. A middle ear effusion is present.  Left Ear: External ear and ear canal normal. Tympanic membrane is erythematous and bulging.  Nose: Nose normal. No mucosal edema or rhinorrhea. Right sinus exhibits no maxillary sinus tenderness and no frontal sinus tenderness. Left sinus exhibits no maxillary sinus tenderness and no frontal sinus tenderness.  Mouth/Throat: Uvula is midline, oropharynx is clear and moist and mucous membranes are normal.  Eyes: Conjunctivae are normal. Pupils are equal, round, and reactive to light.  Neck: Neck supple.  Cardiovascular: Normal rate, regular rhythm, normal heart sounds and intact distal pulses.     Pulmonary/Chest: Effort normal and breath sounds normal. No respiratory distress. He has no wheezes. He has no rales. He exhibits no tenderness.  Lymphadenopathy:    He has no cervical adenopathy.  Neurological: He is alert and oriented to person, place, and time.  Skin: Skin is warm and dry. No rash noted.  Psychiatric: Affect normal.  Vitals reviewed.   Recent Results (from the past 2160 hour(s))  Lipid Profile     Status: Abnormal   Collection Time: 12/22/14  7:56 AM  Result Value Ref Range  Cholesterol 167 0 - 200 mg/dL    Comment: ATP III Classification       Desirable:  < 200 mg/dL               Borderline High:  200 - 239 mg/dL          High:  > = 240 mg/dL   Triglycerides 150.0 (H) 0.0 - 149.0 mg/dL    Comment: Normal:  <150 mg/dLBorderline High:  150 - 199 mg/dL   HDL 34.50 (L) >39.00 mg/dL   VLDL 30.0 0.0 - 40.0 mg/dL   LDL Cholesterol 103 (H) 0 - 99 mg/dL   Total CHOL/HDL Ratio 5     Comment:                Men          Women1/2 Average Risk     3.4          3.3Average Risk          5.0          4.42X Average Risk          9.6          7.13X Average Risk          15.0          11.0                       NonHDL 132.50     Comment: NOTE:  Non-HDL goal should be 30 mg/dL higher than patient's LDL goal (i.e. LDL goal of < 70 mg/dL, would have non-HDL goal of < 100 mg/dL)  CBC     Status: Abnormal   Collection Time: 01/01/15  8:31 AM  Result Value Ref Range   WBC 6.3 4.0 - 10.5 K/uL   RBC 5.09 4.22 - 5.81 Mil/uL   Platelets 114.0 (L) 150.0 - 400.0 K/uL   Hemoglobin 16.2 13.0 - 17.0 g/dL   HCT 47.5 39.0 - 52.0 %   MCV 93.4 78.0 - 100.0 fl   MCHC 34.1 30.0 - 36.0 g/dL   RDW 13.8 11.5 - 26.9 %  Basic Metabolic Panel (BMET)     Status: Abnormal   Collection Time: 01/01/15  8:31 AM  Result Value Ref Range   Sodium 139 135 - 145 mEq/L   Potassium 4.2 3.5 - 5.1 mEq/L   Chloride 103 96 - 112 mEq/L   CO2 29 19 - 32 mEq/L   Glucose, Bld 103 (H) 70 - 99 mg/dL   BUN 21 6 - 23  mg/dL   Creatinine, Ser 1.3 0.4 - 1.5 mg/dL   Calcium 9.2 8.4 - 10.5 mg/dL   GFR 63.66 >60.00 mL/min  INR/PT     Status: None   Collection Time: 01/01/15  8:31 AM  Result Value Ref Range   INR 1.0 0.8 - 1.0 ratio   Prothrombin Time 10.9 9.6 - 13.1 sec  CULTURE, URINE COMPREHENSIVE     Status: None   Collection Time: 01/01/15  8:31 AM  Result Value Ref Range   Colony Count NO GROWTH    Organism ID, Bacteria NO GROWTH   Surgical pcr screen     Status: Abnormal   Collection Time: 01/14/15  8:50 AM  Result Value Ref Range   MRSA, PCR POSITIVE (A) NEGATIVE    Comment: RESULT CALLED TO, READ BACK BY AND VERIFIED WITH: PHILLIPS,K @ 1438 ON [id number] BY POTEAT,S    Staphylococcus aureus POSITIVE (  A) NEGATIVE    Comment:        The Xpert SA Assay (FDA approved for NASAL specimens in patients over 13 years of age), is one component of a comprehensive surveillance program.  Test performance has been validated by [facility] for patients greater than or equal to 68 year old. It is not intended to diagnose infection nor to guide or monitor treatment.   Basic metabolic panel     Status: Abnormal   Collection Time: 01/14/15  9:25 AM  Result Value Ref Range   Sodium 135 135 - 145 mmol/L    Comment: Please note change in reference range.   Potassium 4.1 3.5 - 5.1 mmol/L    Comment: Please note change in reference range.   Chloride 101 96 - 112 mEq/L   CO2 28 19 - 32 mmol/L   Glucose, Bld 131 (H) 70 - 99 mg/dL   BUN 22 6 - 23 mg/dL   Creatinine, Ser 1.27 0.50 - 1.35 mg/dL   Calcium 9.5 8.4 - 10.5 mg/dL   GFR calc non Af Amer 62 (L) >90 mL/min   GFR calc Af Amer 72 (L) >90 mL/min    Comment: (NOTE) The eGFR has been calculated using the CKD EPI equation. This calculation has not been validated in all clinical situations. eGFR's persistently <90 mL/min signify possible Chronic Kidney Disease.    Anion gap 6 5 - 15  CBC     Status: Abnormal   Collection Time: 01/14/15  9:25 AM    Result Value Ref Range   WBC 7.0 4.0 - 10.5 K/uL   RBC 5.03 4.22 - 5.81 MIL/uL   Hemoglobin 16.5 13.0 - 17.0 g/dL   HCT 46.6 39.0 - 52.0 %   MCV 92.6 78.0 - 100.0 fL   MCH 32.8 26.0 - 34.0 pg   MCHC 35.4 30.0 - 36.0 g/dL   RDW 13.6 11.5 - 15.5 %   Platelets 129 (L) 150 - 400 K/uL  Glucose, capillary     Status: Abnormal   Collection Time: 01/20/15  5:35 AM  Result Value Ref Range   Glucose-Capillary 139 (H) 70 - 99 mg/dL   Comment 1 Notify RN   Glucose, capillary     Status: Abnormal   Collection Time: 01/20/15 10:44 AM  Result Value Ref Range   Glucose-Capillary 130 (H) 70 - 99 mg/dL   Comment 1 Documented in Chart    Comment 2 Notify RN   Glucose, capillary     Status: Abnormal   Collection Time: 01/20/15  4:47 PM  Result Value Ref Range   Glucose-Capillary 138 (H) 70 - 99 mg/dL  Glucose, capillary     Status: Abnormal   Collection Time: 01/20/15  5:24 PM  Result Value Ref Range   Glucose-Capillary 147 (H) 70 - 99 mg/dL  Glucose, capillary     Status: Abnormal   Collection Time: 01/20/15  9:50 PM  Result Value Ref Range   Glucose-Capillary 155 (H) 70 - 99 mg/dL  CBC     Status: Abnormal   Collection Time: 01/21/15  5:43 AM  Result Value Ref Range   WBC 9.6 4.0 - 10.5 K/uL   RBC 4.53 4.22 - 5.81 MIL/uL   Hemoglobin 14.6 13.0 - 17.0 g/dL   HCT 42.1 39.0 - 52.0 %   MCV 92.9 78.0 - 100.0 fL   MCH 32.2 26.0 - 34.0 pg   MCHC 34.7 30.0 - 36.0 g/dL   RDW 13.7 11.5 - 15.5 %  Platelets 126 (L) 150 - 400 K/uL  Comprehensive metabolic panel     Status: Abnormal   Collection Time: 01/21/15  5:43 AM  Result Value Ref Range   Sodium 134 (L) 135 - 145 mmol/L   Potassium 3.8 3.5 - 5.1 mmol/L   Chloride 98 96 - 112 mmol/L   CO2 28 19 - 32 mmol/L   Glucose, Bld 152 (H) 70 - 99 mg/dL   BUN 14 6 - 23 mg/dL   Creatinine, Ser 1.03 0.50 - 1.35 mg/dL   Calcium 9.0 8.4 - 10.5 mg/dL   Total Protein 5.7 (L) 6.0 - 8.3 g/dL   Albumin 3.2 (L) 3.5 - 5.2 g/dL   AST 49 (H) 0 - 37 U/L    ALT 32 0 - 53 U/L   Alkaline Phosphatase 29 (L) 39 - 117 U/L   Total Bilirubin 1.4 (H) 0.3 - 1.2 mg/dL   GFR calc non Af Amer 80 (L) >90 mL/min   GFR calc Af Amer >90 >90 mL/min    Comment: (NOTE) The eGFR has been calculated using the CKD EPI equation. This calculation has not been validated in all clinical situations. eGFR's persistently <90 mL/min signify possible Chronic Kidney Disease.    Anion gap 8 5 - 15  Glucose, capillary     Status: Abnormal   Collection Time: 01/21/15  7:11 AM  Result Value Ref Range   Glucose-Capillary 167 (H) 70 - 99 mg/dL  Glucose, capillary     Status: Abnormal   Collection Time: 01/21/15 12:10 PM  Result Value Ref Range   Glucose-Capillary 145 (H) 70 - 99 mg/dL   Comment 1 Notify RN   Glucose, capillary     Status: Abnormal   Collection Time: 01/21/15  4:55 PM  Result Value Ref Range   Glucose-Capillary 149 (H) 70 - 99 mg/dL   Comment 1 Notify RN   Glucose, capillary     Status: Abnormal   Collection Time: 01/21/15 10:36 PM  Result Value Ref Range   Glucose-Capillary 158 (H) 70 - 99 mg/dL   Comment 1 Notify RN    Comment 2 Documented in Chart   Glucose, capillary     Status: Abnormal   Collection Time: 01/22/15  7:13 AM  Result Value Ref Range   Glucose-Capillary 138 (H) 70 - 99 mg/dL  Glucose, capillary     Status: Abnormal   Collection Time: 01/22/15 12:37 PM  Result Value Ref Range   Glucose-Capillary 128 (H) 70 - 99 mg/dL  POCT Influenza A/B     Status: Normal   Collection Time: 03/08/15 11:06 AM  Result Value Ref Range   Influenza A, POC Negative    Influenza B, POC Negative     Assessment/Plan: Viral URI Supportive measures discussed. Plain Mucinex. Rx Flonase. Increase fluids. Rest. Humidifier in bedroom. Patient presently with bacterial ear infection that is being treated (see assessment and plan). Return precautions discussed with patient.   GERD (gastroesophageal reflux disease) Stable. Medication refill per patient  request.   Type 2 diabetes mellitus without complication Patient endorses fasting blood sugars have been mildly elevated since his recent surgery. Are starting to trend down. A.m. fasting glucose this morning at 120s. We'll hold off on checking A1c for a couple of weeks since the level will be skewed due to recent surgery and increase cortisol levels   Acute purulent otitis media Patient is penicillin allergic. Rx azithromycin. Supportive measures discussed. Return precautions discussed with patient.   Hypertriglyceridemia We'll obtain  repeat fasting lipid panel today to reassess triglycerides. Continue current regimen for now.

## 2015-03-08 NOTE — Assessment & Plan Note (Signed)
Patient endorses fasting blood sugars have been mildly elevated since his recent surgery. Are starting to trend down. A.m. fasting glucose this morning at 120s. We'll hold off on checking A1c for a couple of weeks since the level will be skewed due to recent surgery and increase cortisol levels

## 2015-03-09 ENCOUNTER — Ambulatory Visit: Payer: BLUE CROSS/BLUE SHIELD | Admitting: Rehabilitation

## 2015-03-11 ENCOUNTER — Telehealth: Payer: Self-pay | Admitting: Physician Assistant

## 2015-03-11 MED ORDER — BENZONATATE 100 MG PO CAPS: 100.0000 mg | ORAL_CAPSULE | Freq: Three times a day (TID) | ORAL | Status: DC | PRN

## 2015-03-11 NOTE — Telephone Encounter (Signed)
[facility]   WANTS AN RX FOR COUGH MEDICINE  HE CANT SLEEP WITH THE COUGH

## 2015-03-11 NOTE — Telephone Encounter (Signed)
Rx Tessalon sent to pharmacy.

## 2015-03-14 ENCOUNTER — Other Ambulatory Visit: Payer: Self-pay | Admitting: Physician Assistant

## 2015-03-15 ENCOUNTER — Encounter: Payer: Self-pay | Admitting: Rehabilitation

## 2015-03-15 ENCOUNTER — Ambulatory Visit: Payer: BLUE CROSS/BLUE SHIELD | Attending: Orthopedic Surgery | Admitting: Rehabilitation

## 2015-03-15 DIAGNOSIS — Z7409 Other reduced mobility: Secondary | ICD-10-CM | POA: Insufficient documentation

## 2015-03-15 DIAGNOSIS — R6889 Other general symptoms and signs: Secondary | ICD-10-CM

## 2015-03-15 DIAGNOSIS — R531 Weakness: Secondary | ICD-10-CM

## 2015-03-15 NOTE — Telephone Encounter (Signed)
Medication Detail      Disp Refills Start End     fenofibrate micronized (LOFIBRA) 134 MG capsule 30 capsule 1 01/21/2015     Sig - Route: Take 1 capsule (134 mg total) by mouth every other day. - Oral    Class: No Print     Associated Diagnoses    Hypertriglyceridemia - Primary        Rx request to pharmacy/SLS

## 2015-03-15 NOTE — Therapy (Signed)
[facility] [address]  [place], [place], [address] Phone: [phone]   Fax:  [phone]  Physical Therapy Evaluation  Patient Details  Name: Ian [name] MRN: [id number] Date of Birth: October 30, 1961 Referring Provider:  Cecilie Kicks, PA-C  Encounter Date: 03/15/2015      PT End of Session - 03/15/15 1409    Visit Number 1   Number of Visits 4   Date for PT Re-Evaluation 04/05/15   PT Start Time 2482   PT Stop Time 1400   PT Time Calculation (min) 43 min   Activity Tolerance Patient limited by fatigue;Patient limited by lethargy      Past Medical History  Diagnosis Date  . Internal bleeding hemorrhoids 2012    "might bleed once/month; only when I strain"  . Hyperlipidemia   . Clostridium difficile infection 2014  . Testicular hypofunction   . Biceps tendon rupture     bilateral  . GERD (gastroesophageal reflux disease)   . Colon polyps   . Allergy   . Arthritis   . Hypertension   . Diabetes     Borderline/Pre-diabetes  . Leg cramps   . Internal prolapsed hemorrhoids     Past Surgical History  Procedure Laterality Date  . Shoulder arthroscopy w/ rotator cuff repair  07/09/2012    left  . Colonoscopy w/ biopsies and polypectomy  2012  . Shoulder arthroscopy  08/22/2012    Procedure: ARTHROSCOPY SHOULDER;  Surgeon: Marin Shutter, MD;  Location: [facility];  Service: Orthopedics;  Laterality: Left;  Left shoulder arthroscopic lavage and debridement  . Shoulder arthroscopy  09/02/2012    Procedure: ARTHROSCOPY SHOULDER;  Surgeon: Marin Shutter, MD;  Location: [facility];  Service: Orthopedics;  Laterality: Left;  Left shoulder arthroscopy irrigation and debridement  . Fissurectomy    . Excisional hemorrhoidectomy    . Internal sphincterotomy  06-24-2013  . Hemorrhoid surgery  06/24/2013  . Anal fissure repair  06/24/2013  . Wisdom tooth extraction  YRS AGO  . Skin lesion excision      Benign  .  Appendectomy  2011  . Colonscopy  2015  . Lumbar laminectomy/decompression microdiscectomy N/A 01/20/2015    Procedure: MICRO LUMBAR DECOMPRESSION L4-5/L3-4/L2-3;  Surgeon: Johnn Hai, MD;  Location: [facility];  Service: Orthopedics;  Laterality: N/A;    There were no vitals filed for this visit.  Visit Diagnosis:  Decreased strength, endurance, and mobility      Subjective Assessment - 03/15/15 1320    Symptoms Pt presents s/p lumbar decompression L2/3, 3/4, and 4/5.  Reports a significant amount of pain post operatively but no real pain currently.  Does get slight pain sitting too long in the truck or sitting on the sofa. The only problem is getting up the stairs due to leg strength and endurance. The left leg is still feeling weak.  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    Pertinent History history of therapy here pre-operatively   How long can you sit comfortably? 1-2 hours   How long can you stand comfortably? 61minutes   How long can you walk comfortably? 1.5 miles   Patient Stated Goals do a couple PT session to review best exercises and gym precautions.    Currently in Pain? No/denies  up to 3/10            [facility] PT Assessment - 03/15/15 0001  Assessment   Medical Diagnosis lumbar decompression L2/3, 3/4, 4/5 and diskectomy 2/3   Onset Date 01/20/15   Next MD Visit about 4/18   Prior Therapy before surgery   Precautions   Precautions Back   Precaution Booklet Issued No   Precaution Comments don't ride the mower for >1-2 hours, not lifting >15#, no free weights at the gym   Restrictions   Weight Bearing Restrictions No   Balance Screen   Has the patient fallen in the past 6 months No   [place] Private residence   Living Arrangements Spouse/significant other   Additional Comments no problems   Prior Function   Level of Independence Independent with basic ADLs   Observation/Other Assessments   Focus on  Therapeutic Outcomes (FOTO)  49% limited   ROM / Strength   AROM / PROM / Strength AROM;Strength;PROM   AROM   Overall AROM Comments lumbar AROM WNL with slight pain on extension and Sidebend L   Strength   Overall Strength Comments heel and toe walking normal, SL stance WNL bilaterally   Strength Assessment Site Hip;Knee;Ankle   Right/Left Hip Left   Left Hip Flexion 4-/5   Left Hip Extension 4/5   Left Hip External Rotation  --  5/5   Left Hip Internal Rotation  --  5/5   Left Hip ABduction 5/5   Right/Left Knee Left   Left Knee Flexion 4+/5   Left Knee Extension 4+/5   Right/Left Ankle Left   Left Ankle Dorsiflexion 4/5   Left Ankle Plantar Flexion 4-/5   Flexibility   Soft Tissue Assessment /Muscle Length yes   Hamstrings R: 65, L;55   Quadriceps WNL   Ambulation/Gait   Gait Comments pt reports L leg numbness after walking > 3laps at the mall. Gait in clinic still with limp with stance on L side      Eval performed Education on HEP with performance of only lumbar stretches x 1 each (SKTC, DKTC, and LTR) as well as TrA activation 10"x3 stopping after this due to low endurance Discussed walking program 20-30 min per day                     PT Education - 03/15/15 1408    Education provided Yes   Education Details HEP   Person(s) Educated Patient   Methods Handout;Explanation;Demonstration   Comprehension Verbalized understanding          PT Short Term Goals - 03/15/15 1412    PT SHORT TERM GOAL #1   Title independent with HEP   Time 1   Period Weeks   Status Achieved           PT Long Term Goals - 03/15/15 1412    PT LONG TERM GOAL #1   Title indepedent with gym and home program for L LE and core strenthening 4/11   Time 3   Period Weeks   Status New   PT LONG TERM GOAL #2   Title improve endurance in clinic to tolerate all moderate level core exercises without a rest break needed  4/11   Time 3   Period Weeks   Status Achieved                Plan - 03/15/15 1409    Clinical Impression Statement Pt presents s/p lumbar decompression L2/3, L3/4, L4/5 on 01/20/15 due to L sided radicular pain and weakness.  Pt continues with L sided  LE weakness at the ankle plantarflexors, knee, and hip flexors.  Pt exhibits very low endurance at this time fatiguing after only trA contraction work.     Pt will benefit from skilled therapeutic intervention in order to improve on the following deficits Decreased activity tolerance;Decreased endurance;Decreased strength   Rehab Potential Excellent   PT Frequency 1x / week   PT Duration 3 weeks   PT Treatment/Interventions Gait training;Therapeutic exercise   PT Next Visit Plan review current HEP; progress as needed back to independent core and LE strengthening; walking endurance   Consulted and Agree with Plan of Care Patient         Problem List Patient Active Problem List   Diagnosis Date Noted  . Viral URI 03/08/2015  . Acute purulent otitis media 03/08/2015  . Spinal stenosis of lumbar region 01/20/2015  . Spinal stenosis of lumbar region at multiple levels 01/20/2015  . Screening, ischemic heart disease 01/03/2015  . Hypertriglyceridemia 08/17/2014  . Obesity 08/17/2014  . Bee sting 08/17/2014  . Type 2 diabetes mellitus without complication 98/92/1194  . Encounter to establish care with new doctor 03/19/2014  . Skin lesion of back 03/19/2014  . Essential hypertension, benign 09/13/2013  . Thrombocytopenia, unspecified 09/13/2013  . Hyperlipidemia   . Testicular hypofunction   . Biceps tendon rupture   . GERD (gastroesophageal reflux disease)   . Colon polyps   . Septic arthritis of shoulder, left 10/02/2012    Stark Bray, DPT, CMP 03/15/2015, 2:15 PM  [facility] [address]  [address] [place] [place], [place], [address] Phone: [phone]   Fax:  [phone]

## 2015-03-22 ENCOUNTER — Ambulatory Visit: Payer: BLUE CROSS/BLUE SHIELD | Admitting: Rehabilitation

## 2015-03-22 DIAGNOSIS — Z7409 Other reduced mobility: Secondary | ICD-10-CM | POA: Diagnosis not present

## 2015-03-22 DIAGNOSIS — R531 Weakness: Secondary | ICD-10-CM

## 2015-03-22 NOTE — Therapy (Signed)
[facility] [address]  [place], [place], [address] Phone: [phone]   Fax:  [phone]  Physical Therapy Treatment  Patient Details  Name: Ian [name] MRN: [id number] Date of Birth: Jan 31, 1961 Referring Provider:  Cecilie Kicks, PA-C  Encounter Date: 03/22/2015      PT End of Session - 03/22/15 1105    Visit Number 2   Number of Visits 4   Date for PT Re-Evaluation 04/05/15   PT Start Time 6962   PT Stop Time 1145   PT Time Calculation (min) 43 min      Past Medical History  Diagnosis Date  . Internal bleeding hemorrhoids 2012    "might bleed once/month; only when I strain"  . Hyperlipidemia   . Clostridium difficile infection 2014  . Testicular hypofunction   . Biceps tendon rupture     bilateral  . GERD (gastroesophageal reflux disease)   . Colon polyps   . Allergy   . Arthritis   . Hypertension   . Diabetes     Borderline/Pre-diabetes  . Leg cramps   . Internal prolapsed hemorrhoids     Past Surgical History  Procedure Laterality Date  . Shoulder arthroscopy w/ rotator cuff repair  07/09/2012    left  . Colonoscopy w/ biopsies and polypectomy  2012  . Shoulder arthroscopy  08/22/2012    Procedure: ARTHROSCOPY SHOULDER;  Surgeon: Marin Shutter, MD;  Location: [facility];  Service: Orthopedics;  Laterality: Left;  Left shoulder arthroscopic lavage and debridement  . Shoulder arthroscopy  09/02/2012    Procedure: ARTHROSCOPY SHOULDER;  Surgeon: Marin Shutter, MD;  Location: [facility];  Service: Orthopedics;  Laterality: Left;  Left shoulder arthroscopy irrigation and debridement  . Fissurectomy    . Excisional hemorrhoidectomy    . Internal sphincterotomy  06-24-2013  . Hemorrhoid surgery  06/24/2013  . Anal fissure repair  06/24/2013  . Wisdom tooth extraction  YRS AGO  . Skin lesion excision      Benign  . Appendectomy  2011  . Colonscopy  2015  . Lumbar laminectomy/decompression  microdiscectomy N/A 01/20/2015    Procedure: MICRO LUMBAR DECOMPRESSION L4-5/L3-4/L2-3;  Surgeon: Johnn Hai, MD;  Location: [facility];  Service: Orthopedics;  Laterality: N/A;    There were no vitals filed for this visit.  Visit Diagnosis:  Decreased strength, endurance, and mobility      Subjective Assessment - 03/22/15 1104    Symptoms Notes some pain this morning which was resolved with ibuprofen. Hasn't been able to do HEP due to bad coughing/illness. Has been walking indoor but not outdoors yet. Is getting back to work this week.    Currently in Pain? No/denies     Nustep level 4.0 x5 minutes UE/LE Hooklying TrA with march 3" hold x10 Hooklying TrA with SLR 3" hold x10  Bridging 3" hold x10 Standing hip flexion, abduction, extension x10 with 3" holds with chair assist Squats x10 with chair assist Seated TrA with march 3" hold x10 Seated TrA with OH lift x10 the diagonals x10             PT Short Term Goals - 03/15/15 1412    PT SHORT TERM GOAL #1   Title independent with HEP   Time 1   Period Weeks   Status Achieved           PT Long Term Goals - 03/15/15 1412    PT LONG TERM  GOAL #1   Title indepedent with gym and home program for L LE and core strenthening 4/11   Time 3   Period Weeks   Status New   PT LONG TERM GOAL #2   Title improve endurance in clinic to tolerate all moderate level core exercises without a rest break needed  4/11   Time 3   Period Weeks   Status Achieved               Plan - 03/22/15 1151    Clinical Impression Statement Good tolerance to exercises today, needing 2 rest breaks only. Increased HEP today.    PT Next Visit Plan Continue with core and LE strengthening. Treadmill vs Nustep as warm up   Consulted and Agree with Plan of Care Patient        Problem List Patient Active Problem List   Diagnosis Date Noted  . Viral URI 03/08/2015  . Acute purulent otitis media 03/08/2015  . Spinal stenosis of lumbar  region 01/20/2015  . Spinal stenosis of lumbar region at multiple levels 01/20/2015  . Screening, ischemic heart disease 01/03/2015  . Hypertriglyceridemia 08/17/2014  . Obesity 08/17/2014  . Bee sting 08/17/2014  . Type 2 diabetes mellitus without complication 53/61/4431  . Encounter to establish care with new doctor 03/19/2014  . Skin lesion of back 03/19/2014  . Essential hypertension, benign 09/13/2013  . Thrombocytopenia, unspecified 09/13/2013  . Hyperlipidemia   . Testicular hypofunction   . Biceps tendon rupture   . GERD (gastroesophageal reflux disease)   . Colon polyps   . Septic arthritis of shoulder, left 10/02/2012    Barbette Hair, PTA 03/22/2015, 11:53 AM  [facility] [address]  [place], [place], [address] Phone: [phone]   Fax:  [phone]

## 2015-03-26 ENCOUNTER — Encounter: Payer: Self-pay | Admitting: Physician Assistant

## 2015-03-26 ENCOUNTER — Ambulatory Visit (INDEPENDENT_AMBULATORY_CARE_PROVIDER_SITE_OTHER): Payer: BLUE CROSS/BLUE SHIELD | Admitting: Physician Assistant

## 2015-03-26 VITALS — BP 118/80 | HR 77 | Temp 98.4°F | Resp 16 | Ht 68.0 in | Wt 267.4 lb

## 2015-03-26 DIAGNOSIS — B9689 Other specified bacterial agents as the cause of diseases classified elsewhere: Secondary | ICD-10-CM | POA: Insufficient documentation

## 2015-03-26 DIAGNOSIS — J00 Acute nasopharyngitis [common cold]: Secondary | ICD-10-CM

## 2015-03-26 DIAGNOSIS — J208 Acute bronchitis due to other specified organisms: Principal | ICD-10-CM

## 2015-03-26 MED ORDER — DOXYCYCLINE HYCLATE 100 MG PO CAPS: 100.0000 mg | ORAL_CAPSULE | Freq: Two times a day (BID) | ORAL | Status: DC

## 2015-03-26 MED ORDER — HYDROCODONE-HOMATROPINE 5-1.5 MG/5ML PO SYRP: 5.0000 mL | ORAL_SOLUTION | Freq: Four times a day (QID) | ORAL | Status: DC | PRN

## 2015-03-26 NOTE — Assessment & Plan Note (Signed)
Rx doxycycline. Rx Hycodan for cough. Plain Mucinex twice daily as directed. Continue chronic medications. Increase fluid intake and get plenty of rest. Patient instructed to call or return to clinic if symptoms are not improving.

## 2015-03-26 NOTE — Patient Instructions (Signed)
Please take the antibiotic as directed. Continue the ascription cough syrup as directed. Take plain Mucinex twice daily. Increase fluids. Place humidifier in bedroom. Continue chronic medications.  Follow-up with ENT if symptoms are not improving.

## 2015-03-26 NOTE — Progress Notes (Signed)
Patient presents to clinic today c/o continued cough productive of white to green sputum, fatigue and ear pressure. Was previously evaluated by ENT and given steroid shot. Patient states that symptoms acutely worsen over the past few days. Denies shortness of breath or chest pain. Denies recent travel or sick contact.  Past Medical History  Diagnosis Date  . Internal bleeding hemorrhoids 2012    "might bleed once/month; only when I strain"  . Hyperlipidemia   . Clostridium difficile infection 2014  . Testicular hypofunction   . Biceps tendon rupture     bilateral  . GERD (gastroesophageal reflux disease)   . Colon polyps   . Allergy   . Arthritis   . Hypertension   . Diabetes     Borderline/Pre-diabetes  . Leg cramps   . Internal prolapsed hemorrhoids     Current Outpatient Prescriptions on File Prior to Visit  Medication Sig Dispense Refill  . ALLERGIST [name] Inject 0.1 mLs into the skin once a week.   6  . Blood Glucose Monitoring Suppl (RELION CONFIRM GLUCOSE MONITOR) W/DEVICE KIT USE AS DIRECTED TO TEST BLOOD GLUCOSE DAILY DX: 250.00 1 kit 0  . Cholecalciferol (VITAMIN D-3) 5000 UNITS TABS Take 1 tablet by mouth daily.    . clomiPHENE (CLOMID) 50 MG tablet Take 25 mg by mouth daily.    Marland Kitchen docusate sodium (COLACE) 100 MG capsule Take 1 capsule (100 mg total) by mouth 2 (two) times daily as needed for mild constipation. 20 capsule 1  . fenofibrate micronized (LOFIBRA) 134 MG capsule TAKE 1 CAPSULE (134 MG TOTAL) BY MOUTH DAILY BEFORE BREAKFAST. 30 capsule 1  . glucose blood test strip RELION BLOOD GLUCOSE TEST STRIP Use as instructed to test daily Dx: 250.00 50 each 11  . HYDROcodone-acetaminophen (NORCO) 10-325 MG per tablet Take 1-2 tablets by mouth every 6 (six) hours as needed (Pain). 60 tablet 0  . ibuprofen (ADVIL,MOTRIN) 200 MG tablet Take 400-600 mg by mouth every 6 (six) hours as needed (Pain).    Marland Kitchen lisinopril-hydrochlorothiazide  (PRINZIDE,ZESTORETIC) 10-12.5 MG per tablet Take 1 tablet by mouth daily.     Marland Kitchen LIVALO 4 MG TABS TAKE 1 TABLET (4 MG TOTAL) BY MOUTH DAILY. 30 tablet 3  . methocarbamol (ROBAXIN) 500 MG tablet Take 1 tablet (500 mg total) by mouth every 8 (eight) hours as needed for muscle spasms. 60 tablet 1  . omega-3 acid ethyl esters (LOVAZA) 1 G capsule Take 2 capsules (2 g total) by mouth 2 (two) times daily. (Patient taking differently: Take 2 g by mouth 2 (two) times daily. Patient reports has been taking [2] once daily) 120 capsule 1  . omeprazole (PRILOSEC) 20 MG capsule Take 1 capsule (20 mg total) by mouth daily. 90 capsule 3  . potassium gluconate 595 MG TABS tablet Take 595 mg by mouth daily.    . Wheat Dextrin (EQ FIBER POWDER PO) Take 2 each by mouth 3 (three) times daily.      No current facility-administered medications on file prior to visit.    Allergies  Allergen Reactions  . Tetanus Toxoids Anaphylaxis  . Daptomycin Other (See Comments)    myalgias  . Citrus Diarrhea    All citrus fruits  . Milk-Related Compounds Diarrhea    Milk and milk products  . Oxycodone   . Asa [Aspirin] Nausea Only and Rash  . Fenofibrate Other (See Comments)    Constipation w/anal fissure [Sx]  .  Penicillins Rash  . Prednisone (Pak) [Prednisone] Other (See Comments)    Hyperactivity WITH ORAL, CAN TAKE SHOTS    Family History  Problem Relation Age of Onset  . Heart disease Mother 12    Deceased  . Hypertension Mother   . Hyperlipidemia Mother   . Heart disease Father 30    Deceased  . CVA Father   . Hypertension Maternal Grandmother   . Hyperlipidemia Maternal Grandmother   . Cancer Maternal Grandmother     Colon Cancer  . Heart disease Maternal Grandfather   . Hypertension Maternal Grandfather   . Hyperlipidemia Maternal Grandfather   . Cancer Maternal Grandfather     Lung Cancer  . Asthma Paternal Grandmother   . Heart disease Paternal Grandfather     History   Social History  .  Marital Status: Married    Spouse Name: N/A  . Number of Children: N/A  . Years of Education: N/A   Social History Main Topics  . Smoking status: Never Smoker   . Smokeless tobacco: Never Used  . Alcohol Use: No  . Drug Use: No  . Sexual Activity: Not Currently   Other Topics Concern  . None   Social History Narrative   Marital Status: Married Pamala Hurry)    Children:  Step Daughter Sharyn Lull)    Pets: None    Living Situation: Lives with Pamala Hurry    Occupation: [employer] ([employer]    Education: Associate's Degree in [employer]    Tobacco Use/Exposure:  None    Alcohol Use:  Rarely    Drug Use:  None   Diet:  Regular   Exercise: Limited     Hobbies:  Hunting, Fishing             Review of Systems - See HPI.  All other ROS are negative.  BP 118/80 mmHg  Pulse 77  Temp(Src) 98.4 F (36.9 C) (Oral)  Resp 16  Ht $R'5\' 8"'EI$  (1.727 m)  Wt 267 lb 6 oz (121.281 kg)  BMI 40.66 kg/m2  SpO2 97%  Physical Exam  Constitutional: He is oriented to person, place, and time and well-developed, well-nourished, and in no distress.  HENT:  Head: Normocephalic and atraumatic.  Right Ear: External ear normal.  Left Ear: External ear normal.  Nose: Nose normal.  Mouth/Throat: Oropharynx is clear and moist. No oropharyngeal exudate.  Tympanic membranes within normal limits bilaterally.  Cardiovascular: Normal rate, regular rhythm, normal heart sounds and intact distal pulses.   Pulmonary/Chest: Effort normal and breath sounds normal. No respiratory distress. He has no wheezes. He has no rales. He exhibits no tenderness.  Neurological: He is alert and oriented to person, place, and time.  Skin: Skin is warm. No rash noted.  Psychiatric: Affect normal.  Vitals reviewed.   Recent Results (from the past 2160 hour(s))  CBC     Status: Abnormal   Collection Time: 01/01/15  8:31 AM  Result Value Ref Range   WBC 6.3 4.0 - 10.5 K/uL   RBC 5.09 4.22 - 5.81 Mil/uL   Platelets  114.0 (L) 150.0 - 400.0 K/uL   Hemoglobin 16.2 13.0 - 17.0 g/dL   HCT 47.5 39.0 - 52.0 %   MCV 93.4 78.0 - 100.0 fl   MCHC 34.1 30.0 - 36.0 g/dL   RDW 13.8 11.5 - 26.4 %  Basic Metabolic Panel (BMET)     Status: Abnormal   Collection Time: 01/01/15  8:31 AM  Result Value Ref Range  Sodium 139 135 - 145 mEq/L   Potassium 4.2 3.5 - 5.1 mEq/L   Chloride 103 96 - 112 mEq/L   CO2 29 19 - 32 mEq/L   Glucose, Bld 103 (H) 70 - 99 mg/dL   BUN 21 6 - 23 mg/dL   Creatinine, Ser 1.3 0.4 - 1.5 mg/dL   Calcium 9.2 8.4 - 10.5 mg/dL   GFR 63.66 >60.00 mL/min  INR/PT     Status: None   Collection Time: 01/01/15  8:31 AM  Result Value Ref Range   INR 1.0 0.8 - 1.0 ratio   Prothrombin Time 10.9 9.6 - 13.1 sec  CULTURE, URINE COMPREHENSIVE     Status: None   Collection Time: 01/01/15  8:31 AM  Result Value Ref Range   Colony Count NO GROWTH    Organism ID, Bacteria NO GROWTH   Surgical pcr screen     Status: Abnormal   Collection Time: 01/14/15  8:50 AM  Result Value Ref Range   MRSA, PCR POSITIVE (A) NEGATIVE    Comment: RESULT CALLED TO, READ BACK BY AND VERIFIED WITH: PHILLIPS,K @ 1438 ON [id number] BY POTEAT,S    Staphylococcus aureus POSITIVE (A) NEGATIVE    Comment:        The Xpert SA Assay (FDA approved for NASAL specimens in patients over 20 years of age), is one component of a comprehensive surveillance program.  Test performance has been validated by [facility] for patients greater than or equal to 50 year old. It is not intended to diagnose infection nor to guide or monitor treatment.   Basic metabolic panel     Status: Abnormal   Collection Time: 01/14/15  9:25 AM  Result Value Ref Range   Sodium 135 135 - 145 mmol/L    Comment: Please note change in reference range.   Potassium 4.1 3.5 - 5.1 mmol/L    Comment: Please note change in reference range.   Chloride 101 96 - 112 mEq/L   CO2 28 19 - 32 mmol/L   Glucose, Bld 131 (H) 70 - 99 mg/dL   BUN 22 6 - 23 mg/dL    Creatinine, Ser 1.27 0.50 - 1.35 mg/dL   Calcium 9.5 8.4 - 10.5 mg/dL   GFR calc non Af Amer 62 (L) >90 mL/min   GFR calc Af Amer 72 (L) >90 mL/min    Comment: (NOTE) The eGFR has been calculated using the CKD EPI equation. This calculation has not been validated in all clinical situations. eGFR's persistently <90 mL/min signify possible Chronic Kidney Disease.    Anion gap 6 5 - 15  CBC     Status: Abnormal   Collection Time: 01/14/15  9:25 AM  Result Value Ref Range   WBC 7.0 4.0 - 10.5 K/uL   RBC 5.03 4.22 - 5.81 MIL/uL   Hemoglobin 16.5 13.0 - 17.0 g/dL   HCT 46.6 39.0 - 52.0 %   MCV 92.6 78.0 - 100.0 fL   MCH 32.8 26.0 - 34.0 pg   MCHC 35.4 30.0 - 36.0 g/dL   RDW 13.6 11.5 - 15.5 %   Platelets 129 (L) 150 - 400 K/uL  Glucose, capillary     Status: Abnormal   Collection Time: 01/20/15  5:35 AM  Result Value Ref Range   Glucose-Capillary 139 (H) 70 - 99 mg/dL   Comment 1 Notify RN   Glucose, capillary     Status: Abnormal   Collection Time: 01/20/15 10:44 AM  Result Value Ref  Range   Glucose-Capillary 130 (H) 70 - 99 mg/dL   Comment 1 Documented in Chart    Comment 2 Notify RN   Glucose, capillary     Status: Abnormal   Collection Time: 01/20/15  4:47 PM  Result Value Ref Range   Glucose-Capillary 138 (H) 70 - 99 mg/dL  Glucose, capillary     Status: Abnormal   Collection Time: 01/20/15  5:24 PM  Result Value Ref Range   Glucose-Capillary 147 (H) 70 - 99 mg/dL  Glucose, capillary     Status: Abnormal   Collection Time: 01/20/15  9:50 PM  Result Value Ref Range   Glucose-Capillary 155 (H) 70 - 99 mg/dL  CBC     Status: Abnormal   Collection Time: 01/21/15  5:43 AM  Result Value Ref Range   WBC 9.6 4.0 - 10.5 K/uL   RBC 4.53 4.22 - 5.81 MIL/uL   Hemoglobin 14.6 13.0 - 17.0 g/dL   HCT 42.1 39.0 - 52.0 %   MCV 92.9 78.0 - 100.0 fL   MCH 32.2 26.0 - 34.0 pg   MCHC 34.7 30.0 - 36.0 g/dL   RDW 13.7 11.5 - 15.5 %   Platelets 126 (L) 150 - 400 K/uL  Comprehensive  metabolic panel     Status: Abnormal   Collection Time: 01/21/15  5:43 AM  Result Value Ref Range   Sodium 134 (L) 135 - 145 mmol/L   Potassium 3.8 3.5 - 5.1 mmol/L   Chloride 98 96 - 112 mmol/L   CO2 28 19 - 32 mmol/L   Glucose, Bld 152 (H) 70 - 99 mg/dL   BUN 14 6 - 23 mg/dL   Creatinine, Ser 1.03 0.50 - 1.35 mg/dL   Calcium 9.0 8.4 - 10.5 mg/dL   Total Protein 5.7 (L) 6.0 - 8.3 g/dL   Albumin 3.2 (L) 3.5 - 5.2 g/dL   AST 49 (H) 0 - 37 U/L   ALT 32 0 - 53 U/L   Alkaline Phosphatase 29 (L) 39 - 117 U/L   Total Bilirubin 1.4 (H) 0.3 - 1.2 mg/dL   GFR calc non Af Amer 80 (L) >90 mL/min   GFR calc Af Amer >90 >90 mL/min    Comment: (NOTE) The eGFR has been calculated using the CKD EPI equation. This calculation has not been validated in all clinical situations. eGFR's persistently <90 mL/min signify possible Chronic Kidney Disease.    Anion gap 8 5 - 15  Glucose, capillary     Status: Abnormal   Collection Time: 01/21/15  7:11 AM  Result Value Ref Range   Glucose-Capillary 167 (H) 70 - 99 mg/dL  Glucose, capillary     Status: Abnormal   Collection Time: 01/21/15 12:10 PM  Result Value Ref Range   Glucose-Capillary 145 (H) 70 - 99 mg/dL   Comment 1 Notify RN   Glucose, capillary     Status: Abnormal   Collection Time: 01/21/15  4:55 PM  Result Value Ref Range   Glucose-Capillary 149 (H) 70 - 99 mg/dL   Comment 1 Notify RN   Glucose, capillary     Status: Abnormal   Collection Time: 01/21/15 10:36 PM  Result Value Ref Range   Glucose-Capillary 158 (H) 70 - 99 mg/dL   Comment 1 Notify RN    Comment 2 Documented in Chart   Glucose, capillary     Status: Abnormal   Collection Time: 01/22/15  7:13 AM  Result Value Ref Range   Glucose-Capillary 138 (  H) 70 - 99 mg/dL  Glucose, capillary     Status: Abnormal   Collection Time: 01/22/15 12:37 PM  Result Value Ref Range   Glucose-Capillary 128 (H) 70 - 99 mg/dL  Comp Met (CMET)     Status: Abnormal   Collection Time:  03/08/15 11:03 AM  Result Value Ref Range   Sodium 134 (L) 135 - 145 mEq/L   Potassium 3.3 (L) 3.5 - 5.1 mEq/L   Chloride 100 96 - 112 mEq/L   CO2 28 19 - 32 mEq/L   Glucose, Bld 99 70 - 99 mg/dL   BUN 16 6 - 23 mg/dL   Creatinine, Ser 1.02 0.40 - 1.50 mg/dL   Total Bilirubin 0.7 0.2 - 1.2 mg/dL   Alkaline Phosphatase 36 (L) 39 - 117 U/L   AST 38 (H) 0 - 37 U/L   ALT 27 0 - 53 U/L   Total Protein 6.6 6.0 - 8.3 g/dL   Albumin 3.8 3.5 - 5.2 g/dL   Calcium 8.9 8.4 - 10.5 mg/dL   GFR 80.45 >60.00 mL/min  Lipid panel     Status: Abnormal   Collection Time: 03/08/15 11:03 AM  Result Value Ref Range   Cholesterol 181 0 - 200 mg/dL    Comment: ATP III Classification       Desirable:  < 200 mg/dL               Borderline High:  200 - 239 mg/dL          High:  > = 240 mg/dL   Triglycerides (H) 0.0 - 149.0 mg/dL    514.0 Triglyceride is over 400; calculations on Lipids are invalid.    Comment: Normal:  <150 mg/dLBorderline High:  150 - 199 mg/dL   HDL 23.30 (L) >39.00 mg/dL   Total CHOL/HDL Ratio 8     Comment:                Men          Women1/2 Average Risk     3.4          3.3Average Risk          5.0          4.42X Average Risk          9.6          7.13X Average Risk          15.0          11.0                      LDL cholesterol, direct     Status: None   Collection Time: 03/08/15 11:03 AM  Result Value Ref Range   Direct LDL 43.0 mg/dL    Comment: Optimal:  <100 mg/dLNear or Above Optimal:  100-129 mg/dLBorderline High:  130-159 mg/dLHigh:  160-189 mg/dLVery High:  >190 mg/dL  POCT Influenza A/B     Status: Normal   Collection Time: 03/08/15 11:06 AM  Result Value Ref Range   Influenza A, POC Negative    Influenza B, POC Negative     Assessment/Plan: Acute bacterial bronchitis Rx doxycycline. Rx Hycodan for cough. Plain Mucinex twice daily as directed. Continue chronic medications. Increase fluid intake and get plenty of rest. Patient instructed to call or return to  clinic if symptoms are not improving.

## 2015-03-26 NOTE — Progress Notes (Signed)
Pre visit review using our clinic review tool, if applicable. No additional management support is needed unless otherwise documented below in the visit note/SLS  

## 2015-03-29 ENCOUNTER — Ambulatory Visit: Payer: BLUE CROSS/BLUE SHIELD | Admitting: Skilled Nursing Facility1

## 2015-03-30 ENCOUNTER — Ambulatory Visit: Payer: BLUE CROSS/BLUE SHIELD | Attending: Orthopedic Surgery | Admitting: Rehabilitation

## 2015-03-30 DIAGNOSIS — Z7409 Other reduced mobility: Secondary | ICD-10-CM | POA: Insufficient documentation

## 2015-03-30 DIAGNOSIS — R6889 Other general symptoms and signs: Secondary | ICD-10-CM

## 2015-03-30 DIAGNOSIS — R531 Weakness: Secondary | ICD-10-CM

## 2015-03-30 NOTE — Therapy (Signed)
[facility] [address]  [place], [place], [address] Phone: [phone]   Fax:  [phone]  Physical Therapy Treatment  Patient Details  Name: Ian [name] MRN: [id number] Date of Birth: 1961/11/15 Referring Provider:  Cecilie Kicks, PA-C  Encounter Date: 03/30/2015      PT End of Session - 03/30/15 1100    Visit Number 3   Number of Visits 4   Date for PT Re-Evaluation 04/05/15   PT Start Time 7322   PT Stop Time 1141   PT Time Calculation (min) 44 min      Past Medical History  Diagnosis Date  . Internal bleeding hemorrhoids 2012    "might bleed once/month; only when I strain"  . Hyperlipidemia   . Clostridium difficile infection 2014  . Testicular hypofunction   . Biceps tendon rupture     bilateral  . GERD (gastroesophageal reflux disease)   . Colon polyps   . Allergy   . Arthritis   . Hypertension   . Diabetes     Borderline/Pre-diabetes  . Leg cramps   . Internal prolapsed hemorrhoids     Past Surgical History  Procedure Laterality Date  . Shoulder arthroscopy w/ rotator cuff repair  07/09/2012    left  . Colonoscopy w/ biopsies and polypectomy  2012  . Shoulder arthroscopy  08/22/2012    Procedure: ARTHROSCOPY SHOULDER;  Surgeon: Marin Shutter, MD;  Location: [facility];  Service: Orthopedics;  Laterality: Left;  Left shoulder arthroscopic lavage and debridement  . Shoulder arthroscopy  09/02/2012    Procedure: ARTHROSCOPY SHOULDER;  Surgeon: Marin Shutter, MD;  Location: [facility];  Service: Orthopedics;  Laterality: Left;  Left shoulder arthroscopy irrigation and debridement  . Fissurectomy    . Excisional hemorrhoidectomy    . Internal sphincterotomy  06-24-2013  . Hemorrhoid surgery  06/24/2013  . Anal fissure repair  06/24/2013  . Wisdom tooth extraction  YRS AGO  . Skin lesion excision      Benign  . Appendectomy  2011  . Colonscopy  2015  . Lumbar laminectomy/decompression  microdiscectomy N/A 01/20/2015    Procedure: MICRO LUMBAR DECOMPRESSION L4-5/L3-4/L2-3;  Surgeon: Johnn Hai, MD;  Location: [facility];  Service: Orthopedics;  Laterality: N/A;    There were no vitals filed for this visit.  Visit Diagnosis:  Decreased strength, endurance, and mobility      Subjective Assessment - 03/30/15 1101    Subjective Reports that his cough had worsen so he went to the doctor who diagnoised him with bronchitis. He is now on meds but hasn't been able to walk outside as much. He is walking in the house as much as he can. He also states that he coughed bad on Saturday and pulled something in his back. Felt better Sunday then he sneezed and pulled it again.          Nustep level 5x5 minutes UE/LE Manual hamstring, SKTC, piriformis stretches 3x20" LTR x2' Seated TrA with opp UE/LE lift 3" x10 Seated TrA with OH lift with small green ball x10 then diagonals x10 Seated childs pose with green pball 4x10" (unable to do R/L due to cramps in chest area) Squats with chair assist x10 Standing march 3" hold x10 Standing hip abduction alternating x10 then hip extension alternating x10 Seated anti trunk rotation green TB 3"x10 each side        PT Short Term Goals - 03/15/15 1412  PT SHORT TERM GOAL #1   Title independent with HEP   Time 1   Period Weeks   Status Achieved           PT Long Term Goals - 03/15/15 1412    PT LONG TERM GOAL #1   Title indepedent with gym and home program for L LE and core strenthening 4/11   Time 3   Period Weeks   Status New   PT LONG TERM GOAL #2   Title improve endurance in clinic to tolerate all moderate level core exercises without a rest break needed  4/11   Time 3   Period Weeks   Status Achieved               Plan - 03/30/15 1141    Clinical Impression Statement Less exercises today due to cramping and pt recovering from bronchitis   PT Next Visit Plan Continue with core and LE strengthening. Treadmill  vs Nustep as warm up   Consulted and Agree with Plan of Care Patient        Problem List Patient Active Problem List   Diagnosis Date Noted  . Acute bacterial bronchitis 03/26/2015  . Viral URI 03/08/2015  . Acute purulent otitis media 03/08/2015  . Spinal stenosis of lumbar region 01/20/2015  . Spinal stenosis of lumbar region at multiple levels 01/20/2015  . Screening, ischemic heart disease 01/03/2015  . Hypertriglyceridemia 08/17/2014  . Obesity 08/17/2014  . Bee sting 08/17/2014  . Type 2 diabetes mellitus without complication 11/57/2620  . Encounter to establish care with new doctor 03/19/2014  . Skin lesion of back 03/19/2014  . Essential hypertension, benign 09/13/2013  . Thrombocytopenia, unspecified 09/13/2013  . Hyperlipidemia   . Testicular hypofunction   . Biceps tendon rupture   . GERD (gastroesophageal reflux disease)   . Colon polyps   . Septic arthritis of shoulder, left 10/02/2012    Barbette Hair, PTA 03/30/2015, 11:42 AM  [facility] [address]  [place], [place], [address] Phone: [phone]   Fax:  [phone]

## 2015-04-01 ENCOUNTER — Encounter: Payer: Self-pay | Admitting: Skilled Nursing Facility1

## 2015-04-01 ENCOUNTER — Encounter: Payer: BLUE CROSS/BLUE SHIELD | Attending: Physician Assistant | Admitting: Skilled Nursing Facility1

## 2015-04-01 VITALS — Ht 68.0 in | Wt 270.0 lb

## 2015-04-01 DIAGNOSIS — Z713 Dietary counseling and surveillance: Secondary | ICD-10-CM | POA: Diagnosis not present

## 2015-04-01 DIAGNOSIS — E781 Pure hyperglyceridemia: Secondary | ICD-10-CM

## 2015-04-01 NOTE — Patient Instructions (Signed)
-  Try a probiotic like a capsule or Kefir  -Eat 3 meals a day and 2-3 snacks a day -If you make half your plate non-starchy vegetables at each meal and some for snacks you will get enough fiber in your diet

## 2015-04-01 NOTE — Progress Notes (Signed)
  Medical Nutrition Therapy:  Appt start time: 11:15 end time:  12:15.   Assessment:  Primary concerns today: Referred for hypertriglyceridemia. Goals (inofrmation to know): find out what I can and cannot eat for diabetes, portions and how to treat cramps (tried drinking water, and taking potassium tablets) has asked his doctors and was perscribed flexoril. Does not eat sweets (cakes, cookies, pies, etc.) Pt Complains of no taste. Pt states Doctor told him to eat grain for fiber but pt states cannot eat bread because of diabetes.  Pt states he is allergic to citrus (no vitamin c tablets, no oranges, or tangerines) but can have a them here and there just not every day. Sleep: since 2013 varies sometimes sleeping 10 hours sometimes sleeping 4 and they are not restful nights.  Pt states Since appendectomy has had a lot of medical changes and sleep changes. Pt states Allergic to all dairy products including cheese. Pt states he loves Hotdogs, fried chicken, biscuits Pt states When he drinks any thin liquid it causes severe coughing (possibly dysphagic) pt states he has not seen an SLP-been happening for the past couple of weeks to months. Pt states he has had no diarrhea, vomiting, or stomach pains. Pt states he has not had his parathyroid/thyroid levels checked lately. Pt states he Has been on fiber supplements for 2 years. Pt states he does not feel well after he eats salmon. Pt labs: Na 134, K 3.3, AST 38, TG 514, HDL 23  Preferred Learning Style:   No preference indicated   Learning Readiness:   Contemplating   MEDICATIONS: See List   DIETARY INTAKE:  Usual eating pattern includes 3 meals and 0 snacks per day.  Everyday foods include fast food.  Avoided foods include none stated.    24-hr recall:  B ( AM): Peanut butter sandwich------bacon and eggs---oatmeal Snk ( AM): none L ( PM): Fast food Snk ( PM): none D ( PM): starch, vegetable, meat  Snk ( PM): none Beverages:  gingerale, water, coffee  *Eats outside of the home 5 days a week  Usual physical activity: walks 3 times a week for 1 mile  Estimated energy needs: 1800 calories 200 g carbohydrates 135 g protein 50 g fat  Progress Towards Goal(s):  In progress.   Nutritional Diagnosis: Excessive oral intake related to frequent dining out  As evidenced by pt report, BMI 41, triglycerides at 514.     Intervention:  Nutrition counseling for hypertriglyceridemia. Dietitian educated pt on: proper balance/varied diet, cutting back on fast food and its relation to triglycerides, the importance of physical activity, and some CHO counting.  Goals: -Try a probiotic like a capsule or Kefir  -Eat 3 meals a day and 2-3 snacks a day -If you make half your plate non-starchy vegetables at each meal and some for snacks you will get enough fiber in your diet  Teaching Method Utilized:  Visual Auditory  Handouts given during visit include:  High triglyceride nutrition therapy  MyPlate  Barriers to learning/adherence to lifestyle change: lack of willingness to decrease his consumption of fast food  Demonstrated degree of understanding via:  Teach Back   Monitoring/Evaluation:  Dietary intake, exercise, TG levels, and body weight in 6 month(s).

## 2015-04-03 IMAGING — DX DG SPINE 1V PORT
1 series · 1 of 1 positions shown · non-contrast
Comparison: Intraoperative localization film 01/20/2015 at [DATE]
a.m.

CLINICAL DATA: Intraoperative localization film. Lumbar spine
surgery L2-L5.

EXAM:
PORTABLE SPINE - 1 VIEW

[l-spine x-table]
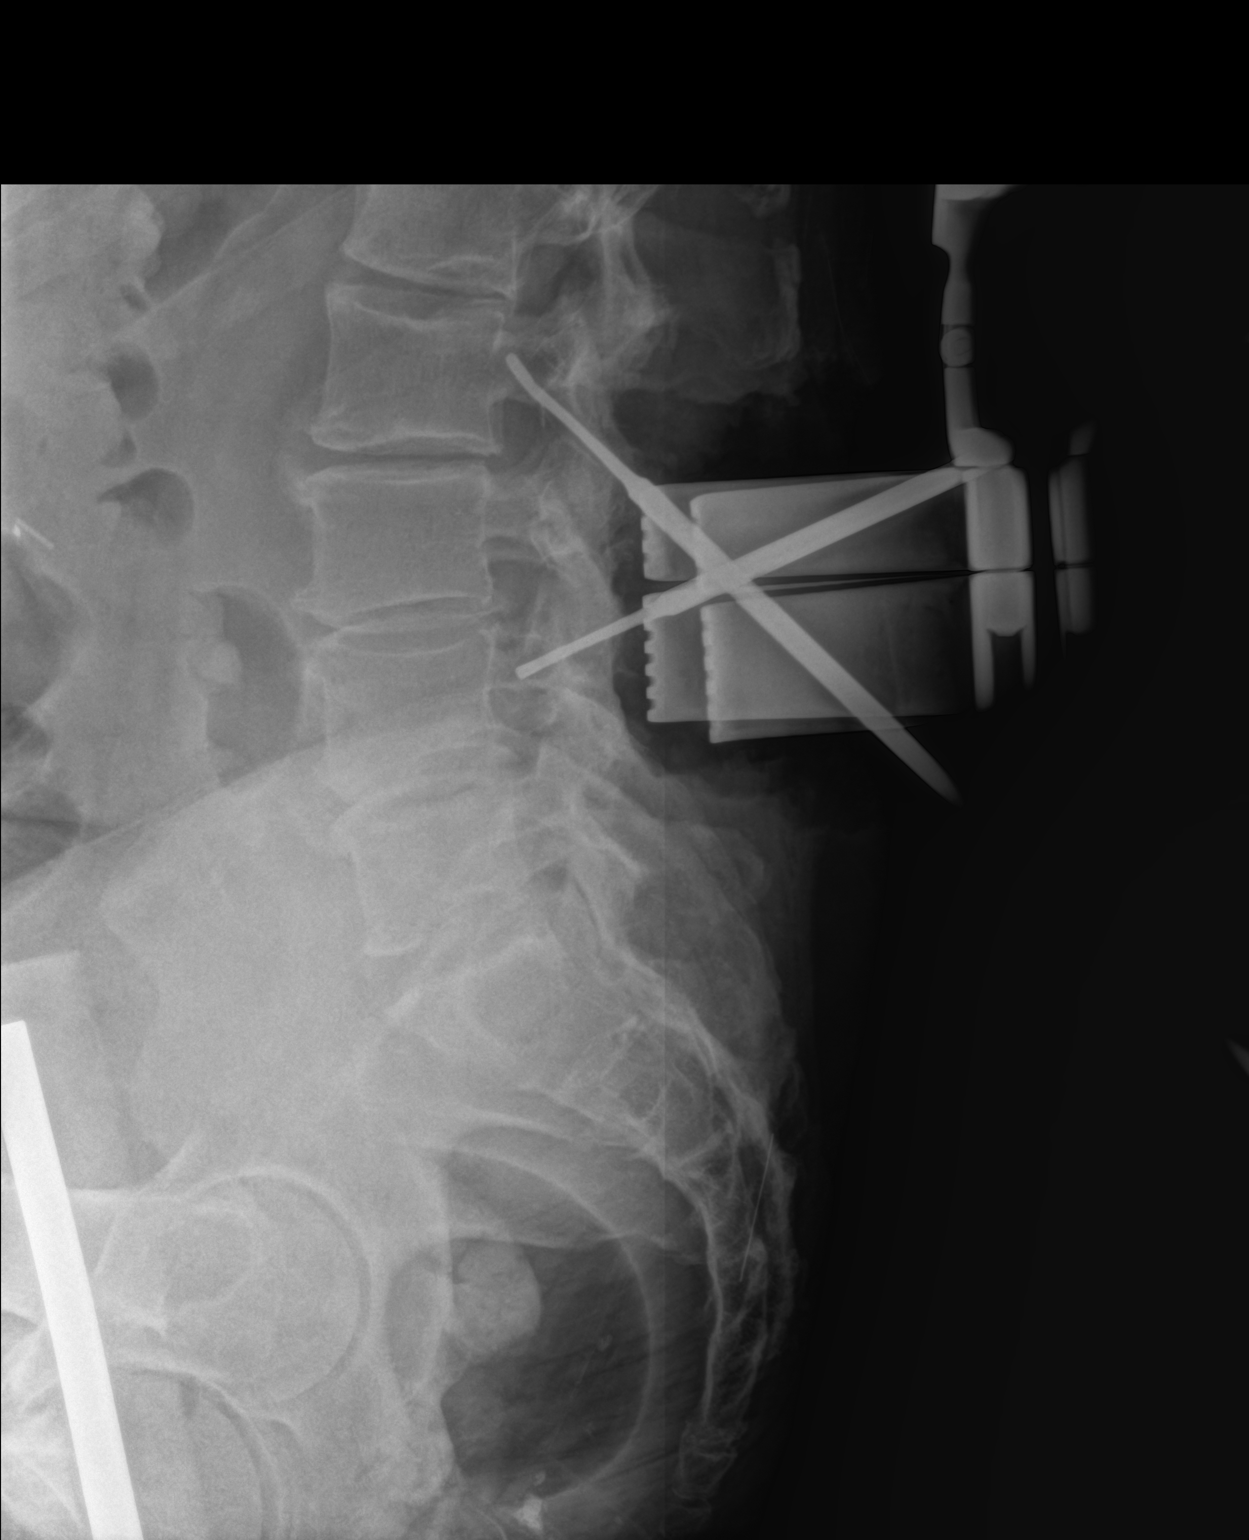

[1 of 1 positions shown; findings below may reference images not displayed]

FINDINGS: Clamps are in place on the L3 and L4 spinous processes. Probes are
identified at the level of the L2 and L4 pedicles.
IMPRESSION: Localization as above.

## 2015-04-03 IMAGING — DX DG SPINE 1V PORT
1 series · 1 of 1 positions shown · non-contrast
Comparison: 01/14/2015

CLINICAL DATA: Surgical levels for operative reference

EXAM:
PORTABLE SPINE - 1 VIEW

[l-spine x-table]
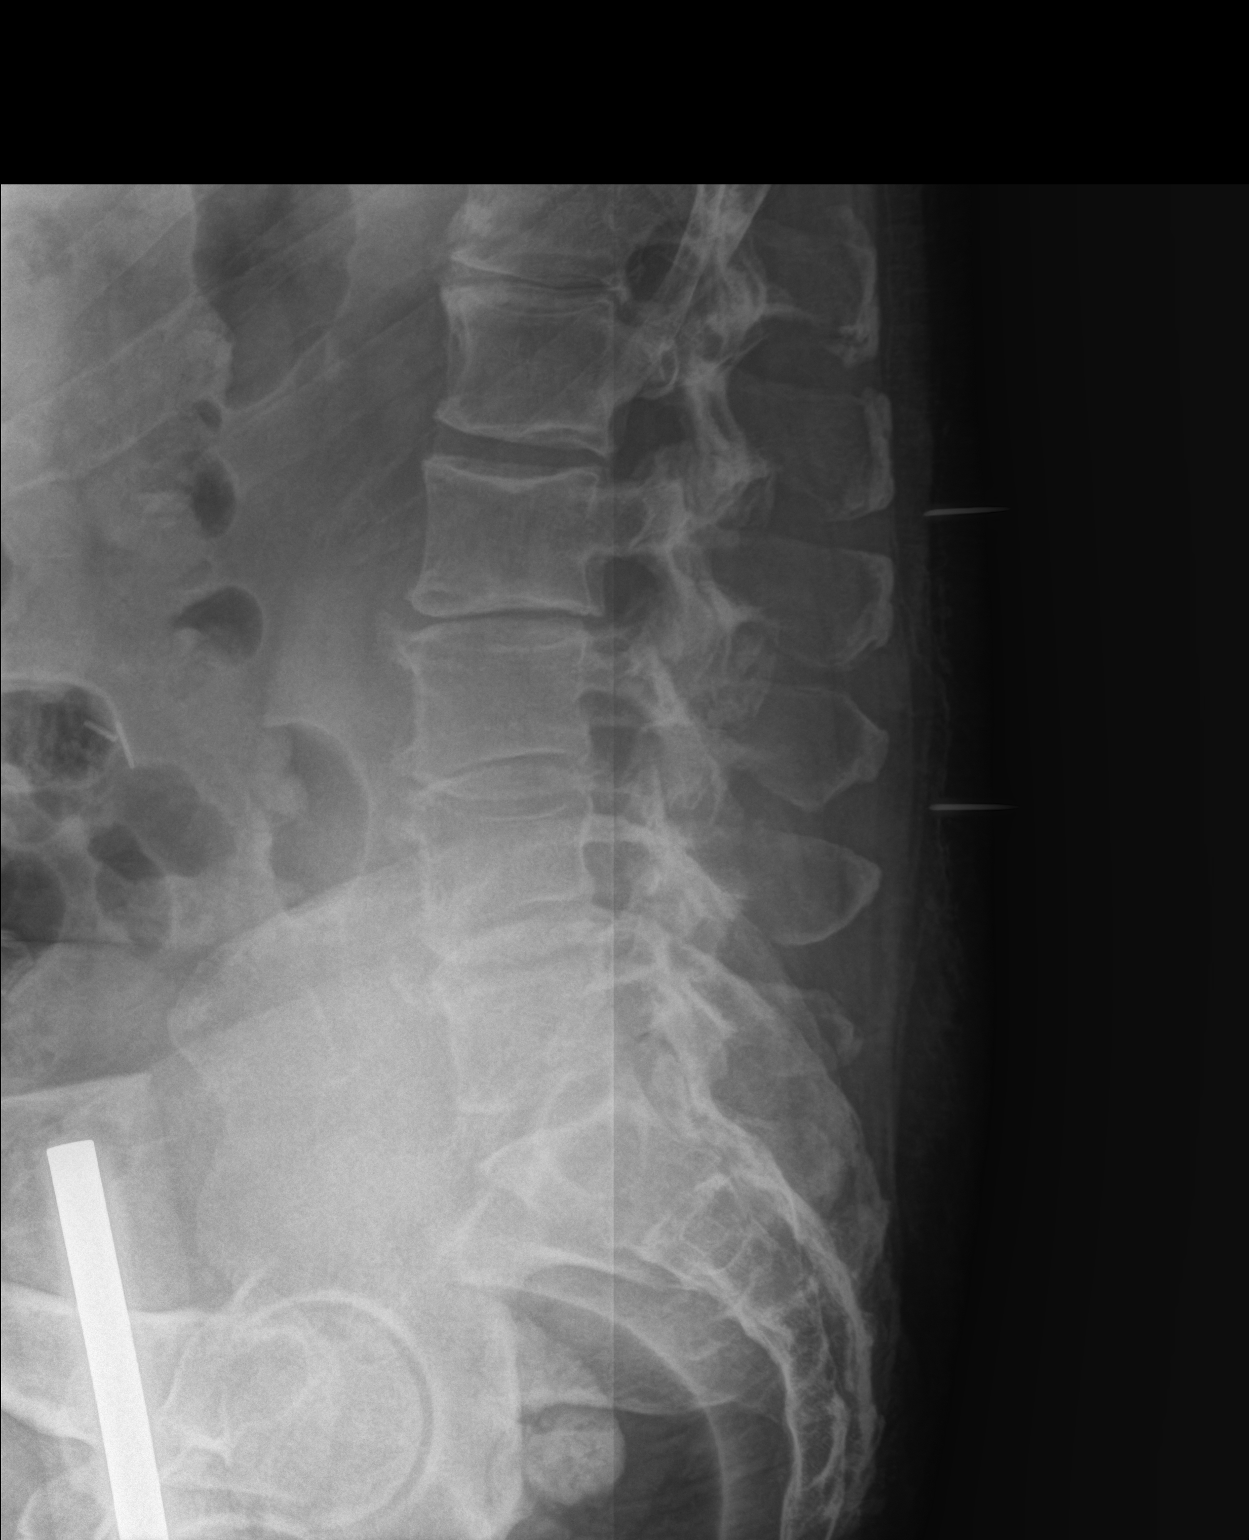

[1 of 1 positions shown; findings below may reference images not displayed]

FINDINGS: A single portable lateral view of the lumbar spine was used to
enumerate the lumbar levels posteriorly. A needle is visible between
the L1 and L2 spinous processes and also between the L3-L4 spinous
processes.
IMPRESSION: Posterior localization as detailed above

## 2015-04-06 ENCOUNTER — Ambulatory Visit: Payer: BLUE CROSS/BLUE SHIELD | Admitting: Rehabilitation

## 2015-04-07 ENCOUNTER — Ambulatory Visit: Payer: BLUE CROSS/BLUE SHIELD | Admitting: Rehabilitation

## 2015-04-07 DIAGNOSIS — Z7409 Other reduced mobility: Secondary | ICD-10-CM | POA: Diagnosis not present

## 2015-04-07 DIAGNOSIS — R531 Weakness: Secondary | ICD-10-CM

## 2015-04-07 DIAGNOSIS — R6889 Other general symptoms and signs: Secondary | ICD-10-CM

## 2015-04-07 NOTE — Therapy (Addendum)
[facility] [address]  [place], [place], [address] Phone: [phone]   Fax:  [phone]  Physical Therapy Treatment  Patient Details  Name: Ian [name] MRN: [id number] Date of Birth: [birth year] Referring Provider:  Cecilie Kicks, PA-C  Encounter Date: 04/07/2015      PT End of Session - 04/07/15 1320    Visit Number 4   Number of Visits 4   Date for PT Re-Evaluation 04/05/15   PT Start Time 9675   PT Stop Time 1400   PT Time Calculation (min) 42 min      Past Medical History  Diagnosis Date  . Internal bleeding hemorrhoids 2012    "might bleed once/month; only when I strain"  . Hyperlipidemia   . Clostridium difficile infection 2014  . Testicular hypofunction   . Biceps tendon rupture     bilateral  . GERD (gastroesophageal reflux disease)   . Colon polyps   . Allergy   . Arthritis   . Hypertension   . Diabetes     Borderline/Pre-diabetes  . Leg cramps   . Internal prolapsed hemorrhoids     Past Surgical History  Procedure Laterality Date  . Shoulder arthroscopy w/ rotator cuff repair  07/09/2012    left  . Colonoscopy w/ biopsies and polypectomy  2012  . Shoulder arthroscopy  08/22/2012    Procedure: ARTHROSCOPY SHOULDER;  Surgeon: Marin Shutter, MD;  Location: [facility];  Service: Orthopedics;  Laterality: Left;  Left shoulder arthroscopic lavage and debridement  . Shoulder arthroscopy  09/02/2012    Procedure: ARTHROSCOPY SHOULDER;  Surgeon: Marin Shutter, MD;  Location: [facility];  Service: Orthopedics;  Laterality: Left;  Left shoulder arthroscopy irrigation and debridement  . Fissurectomy    . Excisional hemorrhoidectomy    . Internal sphincterotomy  06-24-2013  . Hemorrhoid surgery  06/24/2013  . Anal fissure repair  06/24/2013  . Wisdom tooth extraction  YRS AGO  . Skin lesion excision      Benign  . Appendectomy  2011  . Colonscopy  2015  . Lumbar laminectomy/decompression  microdiscectomy N/A 01/20/2015    Procedure: MICRO LUMBAR DECOMPRESSION L4-5/L3-4/L2-3;  Surgeon: Johnn Hai, MD;  Location: [facility];  Service: Orthopedics;  Laterality: N/A;    There were no vitals filed for this visit.  Visit Diagnosis:  Decreased strength, endurance, and mobility      Subjective Assessment - 04/07/15 1322    Subjective Reports his cough is better but still not back to normal.    How long can you sit comfortably? comfortable chair: unlimited, but for an uncomfortable chair: 30 minutes   How long can you stand comfortably? 15-20 minutes   How long can you walk comfortably? 1.5 miles      Nustep level 5x5 minutes UE/LE Low row 25# x15, 35# x15 Alt low row 15# x10 each Knee flexion 25# 2x15 Knee extension 25# 2x15 Wall squats x10 Seated anti trunk rotation green TB 3"x10 each side  Extensive discussion on HEP and gym program.       PT Education - 04/07/15 1404    Education provided Yes   Education Details Gym program, HEP   Person(s) Educated Patient   Methods Explanation;Demonstration   Comprehension Verbalized understanding          PT Short Term Goals - 03/15/15 1412    PT SHORT TERM GOAL #1   Title independent with HEP   Time 1  Period Weeks   Status Achieved           PT Long Term Goals - 04/07/15 1406    PT LONG TERM GOAL #1   Title indepedent with gym and home program for L LE and core strenthening 4/11   Status Achieved               Plan - 04/07/15 1406    Clinical Impression Statement Letting pt try gym and HEP for 2 weeks with option to return if needed.    PT Next Visit Plan Attempt d/c to HEP. Hold x2 weeks.    Consulted and Agree with Plan of Care Patient        Problem List Patient Active Problem List   Diagnosis Date Noted  . Acute bacterial bronchitis 03/26/2015  . Viral URI 03/08/2015  . Acute purulent otitis media 03/08/2015  . Spinal stenosis of lumbar region 01/20/2015  . Spinal stenosis of  lumbar region at multiple levels 01/20/2015  . Screening, ischemic heart disease 01/03/2015  . Hypertriglyceridemia 08/17/2014  . Obesity 08/17/2014  . Bee sting 08/17/2014  . Type 2 diabetes mellitus without complication 67/11/4579  . Encounter to establish care with new doctor 03/19/2014  . Skin lesion of back 03/19/2014  . Essential hypertension, benign 09/13/2013  . Thrombocytopenia, unspecified 09/13/2013  . Hyperlipidemia   . Testicular hypofunction   . Biceps tendon rupture   . GERD (gastroesophageal reflux disease)   . Colon polyps   . Septic arthritis of shoulder, left 10/02/2012    Barbette Hair, PTA 04/07/2015, 2:07 PM  [facility] [address]  [place], [place], [address] Phone: [phone]   Fax:  [phone]     PHYSICAL THERAPY DISCHARGE SUMMARY  Visits from Start of Care: 4  Current functional level related to goals / functional outcomes: LTG met    Education / Equipment: HEP Plan: Patient agrees to discharge.  Patient goals were met. Patient is being discharged due to meeting the stated rehab goals.  ?????        Mr. Hern is being discharged due to be independent with HEP for continued progression.  Leonette Most PT, OCS 04/27/2015 3:13 PM

## 2015-04-09 ENCOUNTER — Telehealth: Payer: Self-pay | Admitting: Physician Assistant

## 2015-04-09 NOTE — Telephone Encounter (Signed)
LMOM with contact name and number for return call RE: results from dietician and further provider instructions referring to Pathologist/SLS

## 2015-04-09 NOTE — Telephone Encounter (Signed)
Pt returning your call, please call back

## 2015-04-09 NOTE — Telephone Encounter (Signed)
Reviewing note from dietician for patient -- endorses issues swallowing.  Would like to set him up with Speech Language Pathologist.  Please let me know if he is willing.

## 2015-04-12 NOTE — Telephone Encounter (Signed)
Paitent states that Dietician was very thorough, but believes that she misinterpreted his "swallowing issues", as he states he told her that this was coming from sinus drainage and his ENT, Dr. Ilda Foil is aware and currently treating him with Prednisone; which he also wanted to let you know that he wants to finish steroid, wait about one month and then come in to do labs requested/SLS

## 2015-05-05 ENCOUNTER — Telehealth: Payer: Self-pay | Admitting: *Deleted

## 2015-05-05 MED ORDER — HYDROCHLOROTHIAZIDE 25 MG PO TABS: 25.0000 mg | ORAL_TABLET | Freq: Every day | ORAL | Status: DC

## 2015-05-05 NOTE — Telephone Encounter (Signed)
pts ENT office called stating that pts chronic cough could be from his medication -lisinopril // requesting maybe a alternative

## 2015-05-05 NOTE — Telephone Encounter (Signed)
Pt notified. Pt agrees

## 2015-05-05 NOTE — Telephone Encounter (Signed)
Notified pt of medication change . Pt states that ENT office was also suggesting to increase his prilosec due to cough . Notified pt that office did not say anything besides the lisinopril-HCTZ but will let provider know.Marland Kitchen

## 2015-05-05 NOTE — Telephone Encounter (Signed)
Continue prilosec.  Add an over-the-counter Zantac at bedtime.

## 2015-05-05 NOTE — Telephone Encounter (Signed)
Patient currently on lisinopril-HCTZ.  Will stop combo pill and send in HCTZ at higher dose.  Take as directed.

## 2015-05-06 ENCOUNTER — Emergency Department (HOSPITAL_BASED_OUTPATIENT_CLINIC_OR_DEPARTMENT_OTHER): Payer: BLUE CROSS/BLUE SHIELD

## 2015-05-06 ENCOUNTER — Encounter (HOSPITAL_BASED_OUTPATIENT_CLINIC_OR_DEPARTMENT_OTHER): Payer: Self-pay

## 2015-05-06 ENCOUNTER — Inpatient Hospital Stay (HOSPITAL_BASED_OUTPATIENT_CLINIC_OR_DEPARTMENT_OTHER)
Admission: EM | Admit: 2015-05-06 | Discharge: 2015-05-10 | DRG: 176 | Disposition: A | Discharge status: Home / Self Care | Payer: BLUE CROSS/BLUE SHIELD | Attending: Internal Medicine | Admitting: Internal Medicine

## 2015-05-06 ENCOUNTER — Telehealth: Payer: Self-pay | Admitting: Physician Assistant

## 2015-05-06 DIAGNOSIS — I10 Essential (primary) hypertension: Secondary | ICD-10-CM | POA: Diagnosis not present

## 2015-05-06 DIAGNOSIS — I2699 Other pulmonary embolism without acute cor pulmonale: Secondary | ICD-10-CM | POA: Diagnosis present

## 2015-05-06 DIAGNOSIS — M4806 Spinal stenosis, lumbar region: Secondary | ICD-10-CM | POA: Diagnosis present

## 2015-05-06 DIAGNOSIS — I824Z2 Acute embolism and thrombosis of unspecified deep veins of left distal lower extremity: Secondary | ICD-10-CM

## 2015-05-06 DIAGNOSIS — Z6841 Body Mass Index (BMI) 40.0 and over, adult: Secondary | ICD-10-CM | POA: Diagnosis not present

## 2015-05-06 DIAGNOSIS — R252 Cramp and spasm: Secondary | ICD-10-CM | POA: Diagnosis present

## 2015-05-06 DIAGNOSIS — E782 Mixed hyperlipidemia: Secondary | ICD-10-CM | POA: Diagnosis present

## 2015-05-06 DIAGNOSIS — K219 Gastro-esophageal reflux disease without esophagitis: Secondary | ICD-10-CM | POA: Diagnosis present

## 2015-05-06 DIAGNOSIS — M48061 Spinal stenosis, lumbar region without neurogenic claudication: Secondary | ICD-10-CM | POA: Diagnosis present

## 2015-05-06 DIAGNOSIS — Z823 Family history of stroke: Secondary | ICD-10-CM | POA: Diagnosis not present

## 2015-05-06 DIAGNOSIS — Z885 Allergy status to narcotic agent status: Secondary | ICD-10-CM

## 2015-05-06 DIAGNOSIS — I824Z9 Acute embolism and thrombosis of unspecified deep veins of unspecified distal lower extremity: Secondary | ICD-10-CM | POA: Diagnosis present

## 2015-05-06 DIAGNOSIS — E6609 Other obesity due to excess calories: Secondary | ICD-10-CM | POA: Diagnosis present

## 2015-05-06 DIAGNOSIS — I82432 Acute embolism and thrombosis of left popliteal vein: Secondary | ICD-10-CM | POA: Diagnosis present

## 2015-05-06 DIAGNOSIS — E291 Testicular hypofunction: Secondary | ICD-10-CM | POA: Diagnosis present

## 2015-05-06 DIAGNOSIS — Z880 Allergy status to penicillin: Secondary | ICD-10-CM

## 2015-05-06 DIAGNOSIS — Z887 Allergy status to serum and vaccine status: Secondary | ICD-10-CM

## 2015-05-06 DIAGNOSIS — I2692 Saddle embolus of pulmonary artery without acute cor pulmonale: Secondary | ICD-10-CM | POA: Diagnosis not present

## 2015-05-06 DIAGNOSIS — O2230 Deep phlebothrombosis in pregnancy, unspecified trimester: Secondary | ICD-10-CM | POA: Insufficient documentation

## 2015-05-06 DIAGNOSIS — E785 Hyperlipidemia, unspecified: Secondary | ICD-10-CM | POA: Diagnosis present

## 2015-05-06 DIAGNOSIS — Z91011 Allergy to milk products: Secondary | ICD-10-CM | POA: Diagnosis not present

## 2015-05-06 DIAGNOSIS — Z8249 Family history of ischemic heart disease and other diseases of the circulatory system: Secondary | ICD-10-CM

## 2015-05-06 DIAGNOSIS — M7121 Synovial cyst of popliteal space [Baker], right knee: Secondary | ICD-10-CM | POA: Diagnosis present

## 2015-05-06 DIAGNOSIS — E119 Type 2 diabetes mellitus without complications: Secondary | ICD-10-CM | POA: Diagnosis present

## 2015-05-06 DIAGNOSIS — Z825 Family history of asthma and other chronic lower respiratory diseases: Secondary | ICD-10-CM | POA: Diagnosis not present

## 2015-05-06 DIAGNOSIS — E669 Obesity, unspecified: Secondary | ICD-10-CM | POA: Diagnosis present

## 2015-05-06 DIAGNOSIS — I8002 Phlebitis and thrombophlebitis of superficial vessels of left lower extremity: Secondary | ICD-10-CM | POA: Diagnosis present

## 2015-05-06 DIAGNOSIS — M7989 Other specified soft tissue disorders: Secondary | ICD-10-CM

## 2015-05-06 DIAGNOSIS — D696 Thrombocytopenia, unspecified: Secondary | ICD-10-CM | POA: Diagnosis present

## 2015-05-06 DIAGNOSIS — K210 Gastro-esophageal reflux disease with esophagitis: Secondary | ICD-10-CM | POA: Diagnosis not present

## 2015-05-06 DIAGNOSIS — Z888 Allergy status to other drugs, medicaments and biological substances status: Secondary | ICD-10-CM

## 2015-05-06 LAB — BASIC METABOLIC PANEL
ANION GAP: 8 (ref 5–15)
BUN: 18 mg/dL (ref 6–20)
CO2: 25 mmol/L (ref 22–32)
Calcium: 8.9 mg/dL (ref 8.9–10.3)
Chloride: 105 mmol/L (ref 101–111)
Creatinine, Ser: 1.04 mg/dL (ref 0.61–1.24)
Glucose, Bld: 127 mg/dL — ABNORMAL HIGH (ref 65–99)
POTASSIUM: 3.9 mmol/L (ref 3.5–5.1)
Sodium: 138 mmol/L (ref 135–145)

## 2015-05-06 LAB — CBC WITH DIFFERENTIAL/PLATELET
Basophils Absolute: 0.1 10*3/uL (ref 0.0–0.1)
Basophils Relative: 1 % (ref 0–1)
EOS ABS: 0.4 10*3/uL (ref 0.0–0.7)
Eosinophils Relative: 5 % (ref 0–5)
HCT: 42.5 % (ref 39.0–52.0)
HEMOGLOBIN: 14.9 g/dL (ref 13.0–17.0)
Lymphocytes Relative: 24 % (ref 12–46)
Lymphs Abs: 1.9 10*3/uL (ref 0.7–4.0)
MCH: 32.6 pg (ref 26.0–34.0)
MCHC: 35.1 g/dL (ref 30.0–36.0)
MCV: 93 fL (ref 78.0–100.0)
MONOS PCT: 9 % (ref 3–12)
Monocytes Absolute: 0.8 10*3/uL (ref 0.1–1.0)
NEUTROS ABS: 4.9 10*3/uL (ref 1.7–7.7)
NEUTROS PCT: 61 % (ref 43–77)
Platelets: 112 10*3/uL — ABNORMAL LOW (ref 150–400)
RBC: 4.57 MIL/uL (ref 4.22–5.81)
RDW: 13.1 % (ref 11.5–15.5)
WBC: 8.1 10*3/uL (ref 4.0–10.5)

## 2015-05-06 LAB — TROPONIN I

## 2015-05-06 LAB — CBG MONITORING, ED: Glucose-Capillary: 121 mg/dL — ABNORMAL HIGH (ref 65–99)

## 2015-05-06 LAB — PROTIME-INR
INR: 1.06 (ref 0.00–1.49)
PROTHROMBIN TIME: 13.8 s (ref 11.6–15.2)

## 2015-05-06 LAB — APTT: aPTT: 31 seconds (ref 24–37)

## 2015-05-06 MED ORDER — IOHEXOL 350 MG/ML SOLN
100.0000 mL | Freq: Once | INTRAVENOUS | Status: AC | PRN
  Administered 2015-05-06: 100 mL via INTRAVENOUS

## 2015-05-06 MED ORDER — HEPARIN BOLUS VIA INFUSION: 4000.0000 [IU] | Freq: Once | INTRAVENOUS | Status: DC

## 2015-05-06 MED ORDER — HEPARIN (PORCINE) IN NACL 100-0.45 UNIT/ML-% IJ SOLN
1750.0000 [IU]/h | INTRAMUSCULAR | Status: DC
  Administered 2015-05-06: 1500 [IU]/h via INTRAVENOUS
  Administered 2015-05-07 (×2): 1750 [IU]/h via INTRAVENOUS
  Filled 2015-05-06 (×3): qty 250

## 2015-05-06 MED ORDER — ONDANSETRON HCL 4 MG/2ML IJ SOLN
4.0000 mg | Freq: Three times a day (TID) | INTRAMUSCULAR | Status: AC | PRN
  Administered 2015-05-06: 4 mg via INTRAVENOUS
  Filled 2015-05-06: qty 2

## 2015-05-06 MED ORDER — MORPHINE SULFATE 4 MG/ML IJ SOLN
4.0000 mg | Freq: Once | INTRAMUSCULAR | Status: AC
  Administered 2015-05-06: 4 mg via INTRAVENOUS
  Filled 2015-05-06: qty 1

## 2015-05-06 MED ORDER — SODIUM CHLORIDE 0.9 % IV BOLUS (SEPSIS)
1000.0000 mL | Freq: Once | INTRAVENOUS | Status: AC
Start: 2015-05-06 — End: 2015-05-06
  Administered 2015-05-06: 1000 mL via INTRAVENOUS

## 2015-05-06 MED ORDER — DIAZEPAM 5 MG PO TABS
5.0000 mg | ORAL_TABLET | Freq: Once | ORAL | Status: AC
  Administered 2015-05-06: 5 mg via ORAL
  Filled 2015-05-06: qty 1

## 2015-05-06 MED ORDER — HYDROMORPHONE HCL 1 MG/ML IJ SOLN
1.0000 mg | INTRAMUSCULAR | Status: DC | PRN
  Administered 2015-05-06: 1 mg via INTRAVENOUS
  Filled 2015-05-06: qty 1

## 2015-05-06 MED ORDER — HEPARIN BOLUS VIA INFUSION
5000.0000 [IU] | Freq: Once | INTRAVENOUS | Status: AC
  Administered 2015-05-06: 5000 [IU] via INTRAVENOUS

## 2015-05-06 NOTE — Progress Notes (Signed)
ANTICOAGULATION CONSULT NOTE - Initial Consult  Pharmacy Consult for heparin Indication: pulmonary embolus  Allergies  Allergen Reactions  . Tetanus Toxoids Anaphylaxis  . Daptomycin Other (See Comments)    myalgias  . Citrus Diarrhea    All citrus fruits  . Milk-Related Compounds Diarrhea    Milk and milk products  . Oxycodone   . Asa [Aspirin] Nausea Only and Rash  . Fenofibrate Other (See Comments)    Constipation w/anal fissure [Sx]  . Penicillins Rash  . Prednisone (Pak) [Prednisone] Other (See Comments)    Hyperactivity WITH ORAL, CAN TAKE SHOTS    Patient Measurements: Height: 5\' 6"  (167.6 cm) Weight: 265 lb (120.203 kg) IBW/kg (Calculated) : 63.8 Heparin Dosing Weight: 82.6 kg  Vital Signs: Temp: 98.2 F (36.8 C) (05/12 1622) Temp Source: Oral (05/12 1622) BP: 140/88 mmHg (05/12 1910) Pulse Rate: 82 (05/12 1910)  Labs:  Recent Labs  05/06/15 1900  HGB 14.9  HCT 42.5  PLT 112*  APTT 31  LABPROT 13.8  INR 1.06  CREATININE 1.04    Estimated Creatinine Clearance: 98.1 mL/min (by C-G formula based on Cr of 1.04).   Medical History: Past Medical History  Diagnosis Date  . Internal bleeding hemorrhoids 2012    "might bleed once/month; only when I strain"  . Hyperlipidemia   . Clostridium difficile infection 2014  . Testicular hypofunction   . Biceps tendon rupture     bilateral  . GERD (gastroesophageal reflux disease)   . Colon polyps   . Allergy   . Arthritis   . Hypertension   . Diabetes     Borderline/Pre-diabetes  . Leg cramps   . Internal prolapsed hemorrhoids     Medications:   (Not in a hospital admission)  Assessment: 54 yo man to start heparin for DVT, PE with evidence of right heart strain.  Baseline Hg 14.9, PTLC 112 Goal of Therapy:  Heparin level 0.3-0.7 units/ml Monitor platelets by anticoagulation protocol: Yes   Plan:  Heparin bolus 5000 unit bolus and drip at 1500 units/hr Check heparin level 6 hours after  start. Daily HL and CBC while on heparin Monitor for bleeding complications   Thanks for allowing pharmacy to be a part of this patient's care.  Excell Seltzer, PharmD Clinical Pharmacist, [phone]  05/06/2015,8:25 PM

## 2015-05-06 NOTE — ED Provider Notes (Signed)
Medical screening examination/treatment/procedure(s) were conducted as a shared visit with non-physician practitioner(s) and myself.  I personally evaluated the patient during the encounter.   EKG Interpretation   Date/Time:  Thursday May 06 2015 21:02:11 EDT Ventricular Rate:  79 PR Interval:  150 QRS Duration: 102 QT Interval:  390 QTC Calculation: 447 R Axis:   -33 Text Interpretation:  Normal sinus rhythm Left axis deviation Abnormal ECG  No significant change since last tracing in 2013 Confirmed by Trishna Cwik,  DO,  Jahquez Steffler [phone]) on 05/06/2015 9:14:38 PM      Pt is a 54 y.o. male who presents to the emergency department with back pain, left leg pain, shortness of breath and cough. Hemodynamically stable and well appearing on exam the patient found to have large left lower extremity DVT and saddle pulmonary embolus with right ventricular heart strain. Discussed with intensivist. Will start heparin.    Wal[place], DO 05/06/15 2114

## 2015-05-06 NOTE — Telephone Encounter (Signed)
Caryl Pina will you please call patient and make him aware he is supposed to stop his lisinopril-HCTZ.  I have sent in an Rx for just HCTZ (does not contain lisinopril, which would be cause of cough).  Please have him take it as directed.  Yonael spoke with patient yesterday regarding all of this so I am not sure what the lingering confusion is.  Let me know if he has any further concerns.

## 2015-05-06 NOTE — Progress Notes (Addendum)
[facility] Progress Note Patient Name: Ian [name] DOB: 04-Sep-1961 MRN: [id number]   Date of Service  05/06/2015  HPI/Events of Note  Discussed pt with N. Pisciotta, PA, at [facility]. Pt with new dx of LE DVT and bilateral PE, CT evidence of R heart strain. I reviewed the Ct scan - there is no saddle clot present as initially reported. He does have a R main PA clot and more distal L main PA clot as well. Troponin is negative and sPESI score is 0.   eICU Interventions  - admit to telemetry or SDU bed to [facility] service - obtain TTE to assess R heart fxn and strain - will likely merit a consult with IR to consider catheter directed lytics if R heart strain confirmed.      Intervention Category Evaluation Type: New Patient Evaluation  [name] 05/06/2015, 9:37 PM

## 2015-05-06 NOTE — ED Provider Notes (Signed)
CSN: [id number]     Arrival date & time 05/06/15  1617 History   First MD Initiated Contact with Patient 05/06/15 1631     Chief Complaint  Patient presents with  . Back Pain     (Consider location/radiation/quality/duration/timing/severity/associated sxs/prior Treatment) HPI   Ian [name] is a 54 y.o. male with past nuchal history significant for diet-controlled diabetes, hypertension, hyperlipidemia complaining of tightness and swelling to left calf which she noticed this afternoon. Patient states his pain is moderate, is been building over the last several days. Patient also complains of low back pain which radiates down the posterior legs on the left worse than the right. Patient had laminectomy by Dr. Tonita Cong at the end of January and symptoms improved but they are returning. He denies fever, chills, inability to ambulate or change in bowel or bladder habits. Patient works outside in Teacher, adult education care. States that he had a intermittently productive cough since March which she attributes to his sinus issues and allergies. Reports that he's had dyspnea on exertion when climbing hills over the course last week which is atypical for him. He denies chest pain, cancer, family history of bleeding dyscrasia. States that he is normally active but when he has pain in his back he will spend the day in bed.  Past Medical History  Diagnosis Date  . Internal bleeding hemorrhoids 2012    "might bleed once/month; only when I strain"  . Hyperlipidemia   . Clostridium difficile infection 2014  . Testicular hypofunction   . Biceps tendon rupture     bilateral  . GERD (gastroesophageal reflux disease)   . Colon polyps   . Allergy   . Arthritis   . Hypertension   . Diabetes     Borderline/Pre-diabetes  . Leg cramps   . Internal prolapsed hemorrhoids    Past Surgical History  Procedure Laterality Date  . Shoulder arthroscopy w/ rotator cuff repair  07/09/2012    left  . Colonoscopy w/ biopsies and  polypectomy  2012  . Shoulder arthroscopy  08/22/2012    Procedure: ARTHROSCOPY SHOULDER;  Surgeon: Marin Shutter, MD;  Location: [facility];  Service: Orthopedics;  Laterality: Left;  Left shoulder arthroscopic lavage and debridement  . Shoulder arthroscopy  09/02/2012    Procedure: ARTHROSCOPY SHOULDER;  Surgeon: Marin Shutter, MD;  Location: [facility];  Service: Orthopedics;  Laterality: Left;  Left shoulder arthroscopy irrigation and debridement  . Fissurectomy    . Excisional hemorrhoidectomy    . Internal sphincterotomy  06-24-2013  . Hemorrhoid surgery  06/24/2013  . Anal fissure repair  06/24/2013  . Wisdom tooth extraction  YRS AGO  . Skin lesion excision      Benign  . Appendectomy  2011  . Colonscopy  2015  . Lumbar laminectomy/decompression microdiscectomy N/A 01/20/2015    Procedure: MICRO LUMBAR DECOMPRESSION L4-5/L3-4/L2-3;  Surgeon: Johnn Hai, MD;  Location: [facility];  Service: Orthopedics;  Laterality: N/A;   Family History  Problem Relation Age of Onset  . Heart disease Mother 65    Deceased  . Hypertension Mother   . Hyperlipidemia Mother   . Heart disease Father 9    Deceased  . CVA Father   . Hypertension Maternal Grandmother   . Hyperlipidemia Maternal Grandmother   . Cancer Maternal Grandmother     Colon Cancer  . Heart disease Maternal Grandfather   . Hypertension Maternal Grandfather   . Hyperlipidemia Maternal Grandfather   . Cancer Maternal Grandfather  Lung Cancer  . Asthma Paternal Grandmother   . Heart disease Paternal Grandfather    History  Substance Use Topics  . Smoking status: Never Smoker   . Smokeless tobacco: Never Used  . Alcohol Use: No    Review of Systems  10 systems reviewed and found to be negative, except as noted in the HPI.  Allergies  Tetanus toxoids; Daptomycin; Citrus; Milk-related compounds; Oxycodone; Asa; Fenofibrate; Penicillins; and Prednisone (pak)  Home Medications   Prior to Admission medications   Medication  Sig Start Date End Date Taking? Authorizing Provider  ALLERGIST [name] Inject 0.1 mLs into the skin once a week.  09/08/14   Historical Provider, MD  Blood Glucose Monitoring Suppl (RELION CONFIRM GLUCOSE MONITOR) W/DEVICE KIT USE AS DIRECTED TO TEST BLOOD GLUCOSE DAILY DX: 250.00 08/13/14   Brunetta Jeans, PA-C  Cholecalciferol (VITAMIN D-3) 5000 UNITS TABS Take 1 tablet by mouth daily.    Historical Provider, MD  clomiPHENE (CLOMID) 50 MG tablet Take 25 mg by mouth daily. 04/07/13   Jonathon Resides, MD  docusate sodium (COLACE) 100 MG capsule Take 1 capsule (100 mg total) by mouth 2 (two) times daily as needed for mild constipation. 01/20/15   Susa Day, MD  glucose blood test strip RELION BLOOD GLUCOSE TEST STRIP Use as instructed to test daily Dx: 250.00 08/13/14   Brunetta Jeans, PA-C  HYDROcodone-acetaminophen [facility]) 10-325 MG per tablet Take 1-2 tablets by mouth every 6 (six) hours as needed (Pain). 01/22/15   Cecilie Kicks, PA-C  HYDROcodone-homatropine (HYCODAN) 5-1.5 MG/5ML syrup Take 5 mLs by mouth every 6 (six) hours as needed for cough. 03/26/15   Brunetta Jeans, PA-C  ibuprofen (ADVIL,MOTRIN) 200 MG tablet Take 400-600 mg by mouth every 6 (six) hours as needed (Pain).    Historical Provider, MD  ipratropium (ATROVENT) 0.06 % nasal spray Place 1 spray into both nostrils 2 (two) times daily.    Historical Provider, MD  LIVALO 4 MG TABS TAKE 1 TABLET (4 MG TOTAL) BY MOUTH DAILY. 02/01/15   Brunetta Jeans, PA-C  omeprazole (PRILOSEC) 20 MG capsule Take 1 capsule (20 mg total) by mouth daily. 03/08/15 03/13/17  Brunetta Jeans, PA-C  potassium gluconate 595 MG TABS tablet Take 595 mg by mouth daily.    Historical Provider, MD  Wheat Dextrin (EQ FIBER POWDER PO) Take 2 each by mouth 3 (three) times daily.     Historical Provider, MD   BP 150/88 mmHg  Pulse 84  Temp(Src) 99.7 F (37.6 C) (Oral)  Resp 19  Ht 5' 6"  (1.676 m)  Wt 265 lb (120.203 kg)   BMI 42.79 kg/m2  SpO2 95% Physical Exam  Constitutional: He is oriented to person, place, and time. He appears well-developed and well-nourished. No distress.  HENT:  Head: Normocephalic and atraumatic.  Mouth/Throat: Oropharynx is clear and moist.  Eyes: Conjunctivae and EOM are normal. Pupils are equal, round, and reactive to light.  Cardiovascular: Normal rate, regular rhythm and intact distal pulses.   Pulmonary/Chest: Effort normal and breath sounds normal. No stridor. No respiratory distress. He has no wheezes. He has no rales. He exhibits no tenderness.  Abdominal: Soft. Bowel sounds are normal. He exhibits no distension and no mass. There is no tenderness. There is no rebound and no guarding.  Musculoskeletal: Normal range of motion. He exhibits edema and tenderness.  Left calf is much larger than the right, compartment is tense.  No distal edema. The area is hyperpigmented secondary to birthmark, there is no warmth.  No point tenderness to percussion of lumbar spinal processes. + left lumbar TTP without paraspinal muscular spasm. Strength is 5 out of 5 to bilateral lower extremities at hip and knee; extensor hallucis longus 5 out of 5. Ankle strength 5 out of 5, no clonus, neurovascularly intact. No saddle anaesthesia. Patellar reflexes are 2+ bilaterally.        Neurological: He is alert and oriented to person, place, and time.  Psychiatric: He has a normal mood and affect.  Nursing note and vitals reviewed.   ED Course  Procedures (including critical care time)  CRITICAL CARE Performed by: Monico Blitz   Total critical care time: 45  Critical care time was exclusive of separately billable procedures and treating other patients.  Critical care was necessary to treat or prevent imminent or life-threatening deterioration.  Critical care was time spent personally by me on the following activities: development of treatment plan with patient and/or surrogate as well as  nursing, discussions with consultants, evaluation of patient's response to treatment, examination of patient, obtaining history from patient or surrogate, ordering and performing treatments and interventions, ordering and review of laboratory studies, ordering and review of radiographic studies, pulse oximetry and re-evaluation of patient's condition.  Labs Review Labs Reviewed  CBC WITH DIFFERENTIAL/PLATELET - Abnormal; Notable for the following:    Platelets 112 (*)    All other components within normal limits  BASIC METABOLIC PANEL - Abnormal; Notable for the following:    Glucose, Bld 127 (*)    All other components within normal limits  APTT  PROTIME-INR  TROPONIN I    Imaging Review Ct Angio Chest Pe W/cm &/or Wo Cm  05/06/2015   CLINICAL DATA:  Cough for several months. Dyspnea. Lower extremity deep venous thrombosis.  EXAM: CT ANGIOGRAPHY CHEST WITH CONTRAST  TECHNIQUE: Multidetector CT imaging of the chest was performed using the standard protocol during bolus administration of intravenous contrast. Multiplanar CT image reconstructions and MIPs were obtained to evaluate the vascular anatomy.  CONTRAST:  141m OMNIPAQUE IOHEXOL 350 MG/ML SOLN  COMPARISON:  Radiographs 01/14/2015.  FINDINGS: There is a large volume of pulmonary embolus bilaterally. This includes saddle emboli across the right and left pulmonary arteries although no emboli are evident in the main pulmonary artery. The pulmonary emboli continued into the upper lobes and lower lobes bilaterally, somewhat less in the left upper lobe. There is CT evidence of right heart strain, with RV to LV ratio of 1.09.  The lungs are clear. Airways are patent. There are no pleural effusions. No significant abnormality is evident in the upper abdomen.  Review of the MIP images confirms the above findings.  IMPRESSION: Positive for acute PE with CT evidence of right heart strain (RV/LV Ratio = 1.09) consistent with at least submassive  (intermediate risk) PE. The presence of right heart strain has been associated with an increased risk of morbidity and mortality. Please activate Code PE by paging [phone]  These results were called by telephone at the time of interpretation on 05/06/2015 at 8:21 pm to PA. Shadiamond Koska , who verbally acknowledged these results.   Electronically Signed   By: DAndreas NewportM.D.   On: 05/06/2015 20:21   UKoreaVenous Img Lower Unilateral Left  05/06/2015   ADDENDUM REPORT: 05/06/2015 19:49  CLINICAL DATA:  Swollen left calf, onset this evening. Recent pain in both posterior thighs.   Electronically Signed  By: Andreas Newport M.D.   On: 05/06/2015 19:49   05/06/2015   CLINICAL DATA:  Swallow left calf, onset this evening. Recent pain in both posterior thighs.  EXAM: Left LOWER EXTREMITY VENOUS DOPPLER ULTRASOUND  TECHNIQUE: Gray-scale sonography with graded compression, as well as color Doppler and duplex ultrasound were performed to evaluate the lower extremity deep venous systems from the level of the common femoral vein and including the common femoral, femoral, profunda femoral, popliteal and calf veins including the posterior tibial, peroneal and gastrocnemius veins when visible. The superficial great saphenous vein was also interrogated. Spectral Doppler was utilized to evaluate flow at rest and with distal augmentation maneuvers in the common femoral, femoral and popliteal veins.  COMPARISON:  None.  FINDINGS: Contralateral Common Femoral Vein: Respiratory phasicity is normal and symmetric with the symptomatic side. No evidence of thrombus. Normal compressibility.  Common Femoral Vein: No evidence of thrombus. Normal compressibility, respiratory phasicity and response to augmentation.  Saphenofemoral Junction: No evidence of thrombus. Normal compressibility and flow on color Doppler imaging.  Profunda Femoral Vein: No evidence of thrombus. Normal compressibility and flow on color Doppler imaging.   Femoral Vein: Beginning in the midportion of the superficial femoral vein, there is distention of the deep venous system with echogenic thrombus, continuing caudally into the upper calf. This is noncompressible and exhibits typical absence of flow on Doppler imaging. This is typical of acute deep venous thrombosis.  Popliteal Vein: Distended with echogenic thrombus, typical of acute deep venous thrombosis.  Calf Veins: Positive for deep venous thrombosis.  Superficial Great Saphenous Vein: No evidence of thrombus. Normal compressibility and flow on color Doppler imaging.  Venous Reflux:  None.  Other Findings:  None.  IMPRESSION: There is acute deep venous thrombosis from the left midthigh to the upper calf. These results will be called to the ordering clinician or representative by the Radiologist Assistant, and communication documented in the PACS or zVision Dashboard.  Electronically Signed: By: Andreas Newport M.D. On: 05/06/2015 18:45     EKG Interpretation   Date/Time:  Thursday May 06 2015 21:02:11 EDT Ventricular Rate:  79 PR Interval:  150 QRS Duration: 102 QT Interval:  390 QTC Calculation: 447 R Axis:   -33 Text Interpretation:  Normal sinus rhythm Left axis deviation Abnormal ECG  No significant change since last tracing in 2013 Confirmed by WARD,  DO,  KRISTEN [phone]) on 05/06/2015 9:14:38 PM      MDM   Final diagnoses:  Lower leg DVT (deep venous thromboembolism), acute, left  Pulmonary embolism, bilateral    Filed Vitals:   05/06/15 1622 05/06/15 1910 05/06/15 2045 05/06/15 2048  BP: 140/64 140/88 150/88 150/88  Pulse: 94 82 80 84  Temp: 98.2 F (36.8 C)   99.7 F (37.6 C)  TempSrc: Oral   Oral  Resp: 20 20  19   Height: 5' 6"  (1.676 m)     Weight: 265 lb (120.203 kg)     SpO2: 96% 96% 94% 95%    Medications  heparin ADULT infusion 100 units/mL (25000 units/250 mL) (1,500 Units/hr Intravenous New Bag/Given 05/06/15 2044)  ondansetron (ZOFRAN) injection 4 mg  (not administered)  HYDROmorphone (DILAUDID) injection 1 mg (not administered)  diazepam (VALIUM) tablet 5 mg (5 mg Oral Given 05/06/15 1714)  sodium chloride 0.9 % bolus 1,000 mL (0 mLs Intravenous Stopped 05/06/15 2045)  morphine 4 MG/ML injection 4 mg (4 mg Intravenous Given 05/06/15 1857)  iohexol (OMNIPAQUE) 350 MG/ML injection 100 mL (100 mLs  Intravenous Contrast Given 05/06/15 2001)  heparin bolus via infusion 5,000 Units (5,000 Units Intravenous Given 05/06/15 2045)    [name] is a pleasant 54 y.o. male presenting with worsening exacerbation of chronic low back pain however, patient states that he is really here for his left calf swelling and pain. Concern for DVT, venous Doppler is ordered.  Ultrasound shows a acute DVT in the left mid thigh to the upper calf. Patient reports cough and dyspnea on exertion over the last week. Will be given CT chest to rule out PE.   Critical results called to me by radiologist Dr. Alroy Dust who reports that this patient has bilateral saddle PEs with evidence of right heart strain. The RV to LV ratio is 1.09. He recommends that this patient be treated as a code PE and intensivist consulted for Consideration of catheter directed thrombolysis.  Intensivist consult from Dr. Kyung Rudd appreciated: He recommends a hospitalist admit this patient as he is hemodynamically stable. Intensivist will consult on the floor at [facility] will need echo and discussion of site directed lysis.  This is a shared visit with the attending physician who personally evaluated the patient and agrees with the care plan.   EKG shows normal sinus rhythm with a left axis deviation. Troponin negative. Case discussed with triad hospitalist Dr. Blaine Hamper    I received a call from [place], they say that there are no stepdown beds at [facility], she spoken with the intensivist Dr. Kyung Rudd who thinks the patient is stable for a telemetry bed, he recommends that this patient be transferred to [facility] so  he will be close to the ICU for evaluation and potential intervention if his condition declines.. This patient's vital signs have remained stable after several hours in the ED, I think he is appropriate for a telemetry bed. I have discussed this with the triad hospitalist Dr. Blaine Hamper who agrees and accepts admission, holding orders are changed to a telemetry bed.  Monico Blitz, PA-C 05/06/15 2223

## 2015-05-06 NOTE — Telephone Encounter (Signed)
His ent doctor wants him to stop the lisinopril hctz to try and stop the cough

## 2015-05-06 NOTE — ED Notes (Signed)
C/o lower back that radiates to both legs x 2 weeks-today left leg was more painful with ambulation with swelling-back surgery Jan 2016

## 2015-05-07 ENCOUNTER — Other Ambulatory Visit: Payer: Self-pay | Admitting: Physician Assistant

## 2015-05-07 ENCOUNTER — Inpatient Hospital Stay (HOSPITAL_COMMUNITY): Payer: BLUE CROSS/BLUE SHIELD

## 2015-05-07 ENCOUNTER — Ambulatory Visit (HOSPITAL_COMMUNITY): Payer: BLUE CROSS/BLUE SHIELD

## 2015-05-07 ENCOUNTER — Telehealth: Payer: Self-pay | Admitting: Physician Assistant

## 2015-05-07 ENCOUNTER — Encounter (HOSPITAL_COMMUNITY): Payer: Self-pay | Admitting: *Deleted

## 2015-05-07 DIAGNOSIS — E119 Type 2 diabetes mellitus without complications: Secondary | ICD-10-CM

## 2015-05-07 DIAGNOSIS — I2699 Other pulmonary embolism without acute cor pulmonale: Secondary | ICD-10-CM

## 2015-05-07 DIAGNOSIS — E785 Hyperlipidemia, unspecified: Secondary | ICD-10-CM

## 2015-05-07 DIAGNOSIS — E291 Testicular hypofunction: Secondary | ICD-10-CM

## 2015-05-07 DIAGNOSIS — E669 Obesity, unspecified: Secondary | ICD-10-CM

## 2015-05-07 DIAGNOSIS — K210 Gastro-esophageal reflux disease with esophagitis: Secondary | ICD-10-CM

## 2015-05-07 DIAGNOSIS — I824Z9 Acute embolism and thrombosis of unspecified deep veins of unspecified distal lower extremity: Secondary | ICD-10-CM | POA: Diagnosis present

## 2015-05-07 DIAGNOSIS — M4806 Spinal stenosis, lumbar region: Secondary | ICD-10-CM

## 2015-05-07 LAB — CBC
HCT: 38 % — ABNORMAL LOW (ref 39.0–52.0)
HEMATOCRIT: 37.9 % — AB (ref 39.0–52.0)
HEMOGLOBIN: 13.5 g/dL (ref 13.0–17.0)
HEMOGLOBIN: 13.1 g/dL (ref 13.0–17.0)
MCH: 32.6 pg (ref 26.0–34.0)
MCH: 32 pg (ref 26.0–34.0)
MCHC: 35.5 g/dL (ref 30.0–36.0)
MCHC: 34.6 g/dL (ref 30.0–36.0)
MCV: 91.8 fL (ref 78.0–100.0)
MCV: 92.7 fL (ref 78.0–100.0)
Platelets: 104 10*3/uL — ABNORMAL LOW (ref 150–400)
Platelets: 94 10*3/uL — ABNORMAL LOW (ref 150–400)
RBC: 4.14 MIL/uL — AB (ref 4.22–5.81)
RBC: 4.09 MIL/uL — AB (ref 4.22–5.81)
RDW: 13.4 % (ref 11.5–15.5)
RDW: 13.5 % (ref 11.5–15.5)
WBC: 8.5 10*3/uL (ref 4.0–10.5)
WBC: 9 10*3/uL (ref 4.0–10.5)

## 2015-05-07 LAB — GLUCOSE, CAPILLARY
GLUCOSE-CAPILLARY: 124 mg/dL — AB (ref 65–99)
GLUCOSE-CAPILLARY: 195 mg/dL — AB (ref 65–99)
GLUCOSE-CAPILLARY: 146 mg/dL — AB (ref 65–99)

## 2015-05-07 LAB — COMPREHENSIVE METABOLIC PANEL
ALBUMIN: 2.8 g/dL — AB (ref 3.5–5.0)
ALT: 18 U/L (ref 17–63)
ANION GAP: 9 (ref 5–15)
AST: 23 U/L (ref 15–41)
Alkaline Phosphatase: 37 U/L — ABNORMAL LOW (ref 38–126)
BUN: 14 mg/dL (ref 6–20)
CALCIUM: 8.3 mg/dL — AB (ref 8.9–10.3)
CO2: 22 mmol/L (ref 22–32)
Chloride: 107 mmol/L (ref 101–111)
Creatinine, Ser: 1.15 mg/dL (ref 0.61–1.24)
GFR calc non Af Amer: >60 mL/min (ref >60)
GLUCOSE: 144 mg/dL — AB (ref 65–99)
Potassium: 3.8 mmol/L (ref 3.5–5.1)
SODIUM: 138 mmol/L (ref 135–145)
TOTAL PROTEIN: 5.1 g/dL — AB (ref 6.5–8.1)
Total Bilirubin: 0.6 mg/dL (ref 0.3–1.2)

## 2015-05-07 LAB — HEPARIN LEVEL (UNFRACTIONATED)
HEPARIN UNFRACTIONATED: 0.36 [IU]/mL (ref 0.30–0.70)
Heparin Unfractionated: 0.24 IU/mL — ABNORMAL LOW (ref 0.30–0.70)
Heparin Unfractionated: 0.48 IU/mL (ref 0.30–0.70)

## 2015-05-07 LAB — TROPONIN I: Troponin I: <0.03 ng/mL (ref <0.031)

## 2015-05-07 LAB — ANTITHROMBIN III: AntiThromb III Func: 60 % — ABNORMAL LOW (ref 75–120)

## 2015-05-07 LAB — MRSA PCR SCREENING: MRSA by PCR: POSITIVE — AB

## 2015-05-07 MED ORDER — ACETAMINOPHEN 650 MG RE SUPP
650.0000 mg | Freq: Four times a day (QID) | RECTAL | Status: DC | PRN
Start: 2015-05-07 — End: 2015-05-10

## 2015-05-07 MED ORDER — PANTOPRAZOLE SODIUM 40 MG PO TBEC
40.0000 mg | DELAYED_RELEASE_TABLET | Freq: Every day | ORAL | Status: DC
  Administered 2015-05-07 – 2015-05-10 (×4): 40 mg via ORAL
  Filled 2015-05-07 (×4): qty 1

## 2015-05-07 MED ORDER — HYDROCODONE-ACETAMINOPHEN 10-325 MG PO TABS
1.0000 | ORAL_TABLET | Freq: Four times a day (QID) | ORAL | Status: DC | PRN
  Administered 2015-05-07: 2 via ORAL
  Administered 2015-05-07 – 2015-05-08 (×2): 1 via ORAL
  Administered 2015-05-08 (×2): 2 via ORAL
  Administered 2015-05-09: 1 via ORAL
  Administered 2015-05-09 – 2015-05-10 (×3): 2 via ORAL
  Filled 2015-05-07 (×6): qty 2
  Filled 2015-05-07 (×2): qty 1
  Filled 2015-05-07: qty 2

## 2015-05-07 MED ORDER — HYDROMORPHONE HCL 1 MG/ML IJ SOLN: 1.0000 mg | INTRAMUSCULAR | Status: DC | PRN

## 2015-05-07 MED ORDER — ACETAMINOPHEN 325 MG PO TABS
650.0000 mg | ORAL_TABLET | Freq: Four times a day (QID) | ORAL | Status: DC | PRN
Start: 2015-05-07 — End: 2015-05-10

## 2015-05-07 MED ORDER — MUPIROCIN 2 % EX OINT
1.0000 "application " | TOPICAL_OINTMENT | Freq: Two times a day (BID) | CUTANEOUS | Status: DC
  Administered 2015-05-07 – 2015-05-10 (×7): 1 via NASAL
  Filled 2015-05-07: qty 22

## 2015-05-07 MED ORDER — VITAMIN D3 25 MCG (1000 UNIT) PO TABS
5000.0000 [IU] | ORAL_TABLET | Freq: Every day | ORAL | Status: DC
  Administered 2015-05-07 – 2015-05-10 (×4): 5000 [IU] via ORAL
  Filled 2015-05-07 (×7): qty 5

## 2015-05-07 MED ORDER — CALCIUM POLYCARBOPHIL 625 MG PO TABS
625.0000 mg | ORAL_TABLET | Freq: Two times a day (BID) | ORAL | Status: DC
  Administered 2015-05-08 – 2015-05-10 (×5): 625 mg via ORAL
  Filled 2015-05-07 (×9): qty 1

## 2015-05-07 MED ORDER — HEPARIN BOLUS VIA INFUSION
3000.0000 [IU] | Freq: Once | INTRAVENOUS | Status: AC
  Administered 2015-05-07: 3000 [IU] via INTRAVENOUS
  Filled 2015-05-07: qty 3000

## 2015-05-07 MED ORDER — DOCUSATE SODIUM 100 MG PO CAPS: 100.0000 mg | ORAL_CAPSULE | Freq: Two times a day (BID) | ORAL | Status: DC | PRN

## 2015-05-07 MED ORDER — CHLORHEXIDINE GLUCONATE CLOTH 2 % EX PADS
6.0000 | MEDICATED_PAD | Freq: Every day | CUTANEOUS | Status: DC
  Administered 2015-05-07 – 2015-05-10 (×4): 6 via TOPICAL

## 2015-05-07 MED ORDER — SODIUM CHLORIDE 0.9 % IJ SOLN
3.0000 mL | Freq: Two times a day (BID) | INTRAMUSCULAR | Status: DC
  Administered 2015-05-07 – 2015-05-09 (×6): 3 mL via INTRAVENOUS

## 2015-05-07 MED ORDER — PRAVASTATIN SODIUM 40 MG PO TABS
80.0000 mg | ORAL_TABLET | Freq: Every day | ORAL | Status: DC
  Administered 2015-05-07 – 2015-05-09 (×3): 80 mg via ORAL
  Filled 2015-05-07 (×3): qty 2

## 2015-05-07 MED ORDER — SODIUM CHLORIDE 0.9 % IV SOLN
INTRAVENOUS | Status: DC
  Administered 2015-05-07 (×2): via INTRAVENOUS

## 2015-05-07 MED ORDER — IPRATROPIUM BROMIDE 0.06 % NA SOLN
1.0000 | Freq: Two times a day (BID) | NASAL | Status: DC
  Administered 2015-05-07 – 2015-05-10 (×6): 1 via NASAL
  Filled 2015-05-07: qty 15

## 2015-05-07 MED ORDER — INSULIN ASPART 100 UNIT/ML ~~LOC~~ SOLN
0.0000 [IU] | Freq: Three times a day (TID) | SUBCUTANEOUS | Status: DC
  Administered 2015-05-07: 2 [IU] via SUBCUTANEOUS
  Administered 2015-05-08: 1 [IU] via SUBCUTANEOUS
  Administered 2015-05-08: 3 [IU] via SUBCUTANEOUS
  Administered 2015-05-09: 2 [IU] via SUBCUTANEOUS
  Administered 2015-05-09 (×2): 1 [IU] via SUBCUTANEOUS

## 2015-05-07 MED ORDER — "TUBERCULIN-ALLERGY SYRINGES 27G X 3/8"" 0.5 ML KIT": 0.1000 mL | PACK | Status: DC

## 2015-05-07 MED ORDER — ALUM & MAG HYDROXIDE-SIMETH 200-200-20 MG/5ML PO SUSP: 30.0000 mL | Freq: Four times a day (QID) | ORAL | Status: DC | PRN

## 2015-05-07 NOTE — Progress Notes (Signed)
UR Completed Sidhant Helderman Graves-Bigelow, RN,BSN [phone]  

## 2015-05-07 NOTE — Progress Notes (Addendum)
ANTICOAGULATION CONSULT NOTE - Follow Up Consult  Pharmacy Consult for Heparin Indication: pulmonary embolus and DVT  Allergies  Allergen Reactions  . Tetanus Toxoids Anaphylaxis  . Daptomycin Other (See Comments)    myalgias  . Citrus Diarrhea    All citrus fruits  . Milk-Related Compounds Diarrhea    Milk and milk products  . Oxycodone   . Asa [Aspirin] Nausea Only and Rash  . Fenofibrate Other (See Comments)    Constipation w/anal fissure [Sx]  . Penicillins Rash  . Prednisone (Pak) [Prednisone] Other (See Comments)    Hyperactivity WITH ORAL, CAN TAKE SHOTS    Patient Measurements: Height: 5\' 8"  (172.7 cm) Weight: 276 lb 1.6 oz (125.238 kg) IBW/kg (Calculated) : 68.4 Heparin Dosing Weight: ~97kg  Vital Signs: Temp: 99 F (37.2 C) (05/13 0800) Temp Source: Oral (05/13 0800) BP: 109/58 mmHg (05/13 0800) Pulse Rate: 66 (05/13 0800)  Labs:  Recent Labs  05/06/15 1900 05/06/15 1907 05/07/15 0245 05/07/15 0601 05/07/15 0615 05/07/15 1052  HGB 14.9  --  13.5  --  13.1  --   HCT 42.5  --  38.0*  --  37.9*  --   PLT 112*  --  104*  --  94*  --   APTT 31  --   --   --   --   --   LABPROT 13.8  --   --   --   --   --   INR 1.06  --   --   --   --   --   HEPARINUNFRC  --   --  0.24*  --   --  0.48  CREATININE 1.04  --   --   --  1.15  --   TROPONINI  --  <0.03  --  <0.03  --   --     Estimated Creatinine Clearance: 93.5 mL/min (by C-G formula based on Cr of 1.15).   Medications:  Heparin @ 1750 units/hr  Assessment: Ian [name] continues on heparin for bilateral PE (per CT 5/12) and LLE DVT (diagnosed at [facility]). Heparin level is now therapeutic after rate increase this morning. Platelets are low at 94 (112 on admit) - will watch closely. No bleeding reported.  Goal of Therapy:  Heparin level 0.3-0.7 units/ml Monitor platelets by anticoagulation protocol: Yes   Plan:  1) Continue heparin at 1750 units/hr 2) Check 6 hour heparin level to confirm  Deboraha Sprang 05/07/2015,11:51 AM   Addendum: Repeat heparin level is therapeutic at 0.36 on 1750 units/hr. No issues noted.   Plan: Continue current dose. Follow-up AM heparin level and CBC.   Sloan Leiter, PharmD, BCPS Clinical Pharmacist [phone] 5:45 PM, 05/07/2015

## 2015-05-07 NOTE — Telephone Encounter (Signed)
Caller: Ines Warf, wife Cell ph#: [phone]  Wife called. Pt is admitted at [facility] on 3W. He has large blood clots in left leg and both lungs. They believe this is the issue regarding his cough. No need to call in blood pressure medication at this time.

## 2015-05-07 NOTE — Progress Notes (Signed)
VASCULAR LAB PRELIMINARY  PRELIMINARY  PRELIMINARY  PRELIMINARY  RLEV completed.    Preliminary report:  Right lower extremity venous duplex negative for DVT from groin to distal calf.  The contralateral left groin is also negative for DVT.   Patient admitted to hospital last evening for PE and positive left calf and popliteal DVT.  August Albino, RVT 05/07/2015, 10:14 AM

## 2015-05-07 NOTE — Telephone Encounter (Signed)
I will follow his hospital course. Thank you for making me aware.

## 2015-05-07 NOTE — Telephone Encounter (Signed)
Called patient at [phone] [facility]) *Preferred* and left message to return call.

## 2015-05-07 NOTE — Progress Notes (Signed)
[facility] PROGRESS NOTE  Ian [name] OZH:[id number] DOB: 1961/09/13 DOA: 05/06/2015 PCP: Leeanne Rio, PA-C  Assessment/Plan: 1. Pulmonary embolism. -Patient presented with complaints of shortness of breath, poor tolerance to physical exertion, with CT scan of lungs showing large volume pulmonary embolus bilaterally. CT scan showing evidence of right heart strain. -Transthoracic echocardiogram is pending -Patient is being treated with intravenous heparin -He remains hemodynamically stable, blood pressures are intact. Will continue monitoring under telemetry. -Transition to oral anticoagulation in the next 24-48 hours -Of note it appeared patient had been on clomiphene in the outpatient setting which carries a risk of causing thromboembolism. This medication has been discontinued.  2. Left lower extremity DVT. -Patient presented with complaints of left calf pain, erythema, swelling. Venous Doppler showing acute deep venous thrombosis in the left mid thigh to the upper calf. -Continue intravenous heparin  3. Chronic back pain. -Status post lumbar laminectomy  4.  Diet-controlled diabetes mellitus.  -Blood sugars are stable  5.  History of hypertension. -Patient presenting with large PE. Blood pressures are stable having last BP of 117/64 off of antihypertensive agents.  Code Status: Full code Family Communication: I spoke to his wife was present at bedside Disposition Plan: Anticipate discharge home when medically stable   Consultants:  PCCM   HPI/Subjective: Patient is a pleasant 54 year old gentleman with a past medical history of dyslipidemia,.-controlled diabetes mellitus, undergoing laminectomy in January 2016, presented to the emergency department on 05/07/2015 with complaints of shortness of breath associated with left calf pain. He reports back surgery limiting activity. Workup in the emergency department included a CT scan with IV contrast that showed  acute pulmonary embolism with CT evidence of right heart strain. He was started on IV heparin.   Objective: Filed Vitals:   05/07/15 1156  BP: 117/64  Pulse: 68  Temp: 99 F (37.2 C)  Resp: 18    Intake/Output Summary (Last 24 hours) at 05/07/15 1421 Last data filed at 05/07/15 1153  Gross per 24 hour  Intake    240 ml  Output    375 ml  Net   -135 ml   Filed Weights   05/06/15 1622 05/07/15 0146  Weight: 120.203 kg (265 lb) 125.238 kg (276 lb 1.6 oz)    Exam:   General:  Patient is in no acute distress, sitting comfortably, currently denies shortness of breath or chest pain.  Cardiovascular: Regular rate and rhythm normal S1-S2  Respiratory: Lungs overall clear to auscultation bilaterally no wheezing rhonchi or rales  Abdomen: Soft nontender nondistended positive bowel sounds  Musculoskeletal: Patient having pain and induration over his left calf region  Data Reviewed: Basic Metabolic Panel:  Recent Labs Lab 05/06/15 1900 05/07/15 0615  NA 138 138  K 3.9 3.8  CL 105 107  CO2 25 22  GLUCOSE 127* 144*  BUN 18 14  CREATININE 1.04 1.15  CALCIUM 8.9 8.3*   Liver Function Tests:  Recent Labs Lab 05/07/15 0615  AST 23  ALT 18  ALKPHOS 37*  BILITOT 0.6  PROT 5.1*  ALBUMIN 2.8*   No results for input(s): LIPASE, AMYLASE in the last 168 hours. No results for input(s): AMMONIA in the last 168 hours. CBC:  Recent Labs Lab 05/06/15 1900 05/07/15 0245 05/07/15 0615  WBC 8.1 8.5 9.0  NEUTROABS 4.9  --   --   HGB 14.9 13.5 13.1  HCT 42.5 38.0* 37.9*  MCV 93.0 91.8 92.7  PLT 112* 104* 94*   Cardiac Enzymes:  Recent Labs Lab 05/06/15 1907 05/07/15 0601  TROPONINI <0.03 <0.03   BNP (last 3 results) No results for input(s): BNP in the last 8760 hours.  ProBNP (last 3 results) No results for input(s): PROBNP in the last 8760 hours.  CBG:  Recent Labs Lab 05/06/15 2337 05/07/15 1159  GLUCAP 121* 124*    Recent Results (from the past  240 hour(s))  MRSA PCR Screening     Status: Abnormal   Collection Time: 05/07/15  1:19 AM  Result Value Ref Range Status   MRSA by PCR POSITIVE (A) NEGATIVE Final    Comment:        The GeneXpert MRSA Assay (FDA approved for NASAL specimens only), is one component of a comprehensive MRSA colonization surveillance program. It is not intended to diagnose MRSA infection nor to guide or monitor treatment for MRSA infections. RESULT CALLED TO, READ BACK BY AND VERIFIED WITH: RN,BRITTANY BUCKNER [phone] @[id number]  THANEY      Studies: Ct Angio Chest Pe W/cm &/or Wo Cm  05/06/2015   CLINICAL DATA:  Cough for several months. Dyspnea. Lower extremity deep venous thrombosis.  EXAM: CT ANGIOGRAPHY CHEST WITH CONTRAST  TECHNIQUE: Multidetector CT imaging of the chest was performed using the standard protocol during bolus administration of intravenous contrast. Multiplanar CT image reconstructions and MIPs were obtained to evaluate the vascular anatomy.  CONTRAST:  156mL OMNIPAQUE IOHEXOL 350 MG/ML SOLN  COMPARISON:  Radiographs 01/14/2015.  FINDINGS: There is a large volume of pulmonary embolus bilaterally. This includes saddle emboli across the right and left pulmonary arteries although no emboli are evident in the main pulmonary artery. The pulmonary emboli continued into the upper lobes and lower lobes bilaterally, somewhat less in the left upper lobe. There is CT evidence of right heart strain, with RV to LV ratio of 1.09.  The lungs are clear. Airways are patent. There are no pleural effusions. No significant abnormality is evident in the upper abdomen.  Review of the MIP images confirms the above findings.  IMPRESSION: Positive for acute PE with CT evidence of right heart strain (RV/LV Ratio = 1.09) consistent with at least submassive (intermediate risk) PE. The presence of right heart strain has been associated with an increased risk of morbidity and mortality. Please activate Code PE by paging  [phone].  These results were called by telephone at the time of interpretation on 05/06/2015 at 8:21 pm to PA. NICOLE PISCIOTTA , who verbally acknowledged these results.   Electronically Signed   By: Andreas Newport M.D.   On: 05/06/2015 20:21   US Venous Img Lower Unilateral Left  05/06/2015   ADDENDUM REPORT: 05/06/2015 19:49  CLINICAL DATA:  Swollen left calf, onset this evening. Recent pain in both posterior thighs.   Electronically Signed   By: Andreas Newport M.D.   On: 05/06/2015 19:49   05/06/2015   CLINICAL DATA:  Swallow left calf, onset this evening. Recent pain in both posterior thighs.  EXAM: Left LOWER EXTREMITY VENOUS DOPPLER ULTRASOUND  TECHNIQUE: Gray-scale sonography with graded compression, as well as color Doppler and duplex ultrasound were performed to evaluate the lower extremity deep venous systems from the level of the common femoral vein and including the common femoral, femoral, profunda femoral, popliteal and calf veins including the posterior tibial, peroneal and gastrocnemius veins when visible. The superficial great saphenous vein was also interrogated. Spectral Doppler was utilized to evaluate flow at rest and with distal augmentation maneuvers in the common femoral, femoral and popliteal veins.  COMPARISON:  None.  FINDINGS: Contralateral Common Femoral Vein: Respiratory phasicity is normal and symmetric with the symptomatic side. No evidence of thrombus. Normal compressibility.  Common Femoral Vein: No evidence of thrombus. Normal compressibility, respiratory phasicity and response to augmentation.  Saphenofemoral Junction: No evidence of thrombus. Normal compressibility and flow on color Doppler imaging.  Profunda Femoral Vein: No evidence of thrombus. Normal compressibility and flow on color Doppler imaging.  Femoral Vein: Beginning in the midportion of the superficial femoral vein, there is distention of the deep venous system with echogenic thrombus, continuing  caudally into the upper calf. This is noncompressible and exhibits typical absence of flow on Doppler imaging. This is typical of acute deep venous thrombosis.  Popliteal Vein: Distended with echogenic thrombus, typical of acute deep venous thrombosis.  Calf Veins: Positive for deep venous thrombosis.  Superficial Great Saphenous Vein: No evidence of thrombus. Normal compressibility and flow on color Doppler imaging.  Venous Reflux:  None.  Other Findings:  None.  IMPRESSION: There is acute deep venous thrombosis from the left midthigh to the upper calf. These results will be called to the ordering clinician or representative by the Radiologist Assistant, and communication documented in the PACS or zVision Dashboard.  Electronically Signed: By: Andreas Newport M.D. On: 05/06/2015 18:45    Scheduled Meds: . Chlorhexidine Gluconate Cloth  6 each Topical Q0600  . cholecalciferol  5,000 Units Oral Daily  . insulin aspart  0-9 Units Subcutaneous TID WC  . ipratropium  1 spray Each Nare BID  . mupirocin ointment  1 application Nasal BID  . pantoprazole  40 mg Oral Daily  . polycarbophil  625 mg Oral BID WC  . pravastatin  80 mg Oral q1800  . sodium chloride  3 mL Intravenous Q12H   Continuous Infusions: . sodium chloride 75 mL/hr at 05/07/15 8850  . heparin 1,750 Units/hr (05/07/15 0901)    Principal Problem:   Pulmonary embolism Active Problems:   Hyperlipidemia   Testicular hypofunction   GERD (gastroesophageal reflux disease)   Essential hypertension, benign   Thrombocytopenia   Type 2 diabetes mellitus without complication   Obesity   Spinal stenosis of lumbar region   Spinal stenosis of lumbar region at multiple levels   Lower leg DVT (deep venous thromboembolism), acute    Time spent:     Kelvin Cellar  [facility] Pager [phone]. If 7PM-7AM, please contact night-coverage at [email or web address], password [facility] 05/07/2015, 2:21 PM  LOS: 1 day

## 2015-05-07 NOTE — Progress Notes (Signed)
ANTICOAGULATION CONSULT NOTE - Follow Up Consult  Pharmacy Consult for Heparin  Indication: pulmonary embolus and DVT  Allergies  Allergen Reactions  . Tetanus Toxoids Anaphylaxis  . Daptomycin Other (See Comments)    myalgias  . Citrus Diarrhea    All citrus fruits  . Milk-Related Compounds Diarrhea    Milk and milk products  . Oxycodone   . Asa [Aspirin] Nausea Only and Rash  . Fenofibrate Other (See Comments)    Constipation w/anal fissure [Sx]  . Penicillins Rash  . Prednisone (Pak) [Prednisone] Other (See Comments)    Hyperactivity WITH ORAL, CAN TAKE SHOTS   Patient Measurements: Height: 5\' 8"  (172.7 cm) Weight: 276 lb 1.6 oz (125.238 kg) IBW/kg (Calculated) : 68.4  Vital Signs: Temp: 98.8 F (37.1 C) (05/13 0146) Temp Source: Oral (05/13 0146) BP: 151/85 mmHg (05/13 0146) Pulse Rate: 96 (05/13 0146)  Labs:  Recent Labs  05/06/15 1900 05/06/15 1907 05/07/15 0245  HGB 14.9  --  13.5  HCT 42.5  --  38.0*  PLT 112*  --  PENDING  APTT 31  --   --   LABPROT 13.8  --   --   INR 1.06  --   --   HEPARINUNFRC  --   --  0.24*  CREATININE 1.04  --   --   TROPONINI  --  <0.03  --     Estimated Creatinine Clearance: 103.4 mL/min (by C-G formula based on Cr of 1.04).   Assessment: New onset large PE/DVT, first HL is low at 0.24, other labs as above, no issues per RN.   Goal of Therapy:  Heparin level 0.3-0.7 units/ml Monitor platelets by anticoagulation protocol: Yes   Plan:  -Heparin 3000 units BOLUS -Increase heparin drip to 1750 units/hr -1100 HL -Daily CBC/HL -Monitor for bleeding  Ian [name] 05/07/2015,3:48 AM

## 2015-05-07 NOTE — H&P (Signed)
[facility] History and Physical  ZED WANNINGER FIE:[id number] DOB: 08-03-1961 DOA: 05/06/2015  Referring physician: ED physician PCP: Leeanne Rio, PA-C  Specialists:   Chief Complaint: cough, sob and left leg swelling  HPI: Ian [name] is a 54 y.o. male with PMH of hyperlipidemia, diet-controled diabetes mellitus, GERD, history of C. difficile colitis, history of allergies, history of testicular dysfunction, who presents with cough, shortness of breath, and swelling over left calf.   Patient reports that he had laminectomy by Dr. Tonita Cong at the end of January. After that, he still has some level of back pain which limited his physical activity. He states that he has intermittently cough and mild SOB on exertion since March which he attributes to his sinus issues and allergies. No chest pain. He has tenderness over left calf area for 2 weeks, and did not pay attention. Today he noticed that his left leg and calf are swelling. He also reports having pain over right posterior thigh which he attributes to radiation from back pain .  Currently patient denies fever, chills, running nose, ear pain, headaches, abdominal pain, diarrhea, constipation, dysuria, urgency, frequency, hematuria, skin rashes. No unilateral weakness, numbness or tingling sensations. No vision change or hearing loss.  In ED, patient was found to have acute DVT in left leg and bilateral saddle pulmonary embolism with right heart strain by CTA. Negative troponin, and INR 1.06, PTT 31, electrolytes okay, temperature 99.7. Patient is admitted to inpatient for further evaluation and treatment.   Where does patient live?      At home    Can patient participate in ADLs? Yes    Review of Systems:   General: no fevers, chills, no changes in body weight, normal appetite, has fatigue HEENT: no blurry vision, hearing changes or sore throat Pulm: has dyspnea and coughing, No wheezing CV: no chest pain, palpitations, has  shortness of breath Abd: no nausea, vomiting,abdominal pain, diarrhea, constipation GU: no dysuria, hematuria, polyuria Ext: has left leg edema Neuro: no unilateral weakness, numbness, or tingling Skin: no rash MSK: No muscle spasm, no deformity, no limitation of range of movement in spin Heme: No easy bruising.  Travel history: No recent long distant travel.  Allergy:  Allergies  Allergen Reactions  . Tetanus Toxoids Anaphylaxis  . Daptomycin Other (See Comments)    myalgias  . Citrus Diarrhea    All citrus fruits  . Milk-Related Compounds Diarrhea    Milk and milk products  . Oxycodone   . Asa [Aspirin] Nausea Only and Rash  . Fenofibrate Other (See Comments)    Constipation w/anal fissure [Sx]  . Penicillins Rash  . Prednisone (Pak) [Prednisone] Other (See Comments)    Hyperactivity WITH ORAL, CAN TAKE SHOTS    Past Medical History  Diagnosis Date  . Internal bleeding hemorrhoids 2012    "might bleed once/month; only when I strain"  . Hyperlipidemia   . Clostridium difficile infection 2014  . Testicular hypofunction   . Biceps tendon rupture     bilateral  . GERD (gastroesophageal reflux disease)   . Colon polyps   . Allergy   . Arthritis   . Hypertension   . Diabetes     Borderline/Pre-diabetes  . Leg cramps   . Internal prolapsed hemorrhoids     Past Surgical History  Procedure Laterality Date  . Shoulder arthroscopy w/ rotator cuff repair  07/09/2012    left  . Colonoscopy w/ biopsies and polypectomy  2012  . Shoulder arthroscopy  08/22/2012    Procedure: ARTHROSCOPY SHOULDER;  Surgeon: Marin Shutter, MD;  Location: [place];  Service: Orthopedics;  Laterality: Left;  Left shoulder arthroscopic lavage and debridement  . Shoulder arthroscopy  09/02/2012    Procedure: ARTHROSCOPY SHOULDER;  Surgeon: Marin Shutter, MD;  Location: [place];  Service: Orthopedics;  Laterality: Left;  Left shoulder arthroscopy irrigation and debridement  . Fissurectomy    .  Excisional hemorrhoidectomy    . Internal sphincterotomy  06-24-2013  . Hemorrhoid surgery  06/24/2013  . Anal fissure repair  06/24/2013  . Wisdom tooth extraction  YRS AGO  . Skin lesion excision      Benign  . Appendectomy  2011  . Colonscopy  2015  . Lumbar laminectomy/decompression microdiscectomy N/A 01/20/2015    Procedure: MICRO LUMBAR DECOMPRESSION L4-5/L3-4/L2-3;  Surgeon: Johnn Hai, MD;  Location: [facility];  Service: Orthopedics;  Laterality: N/A;    Social History:  reports that he has never smoked. He has never used smokeless tobacco. He reports that he does not drink alcohol or use illicit drugs.  Family History:  Family History  Problem Relation Age of Onset  . Heart disease Mother 62    Deceased  . Hyperlipidemia Mother   . Hypertension Mother   . Heart disease Father 30    Deceased  . CVA Father   . Hypertension Maternal Grandmother   . Hyperlipidemia Maternal Grandmother   . Cancer Maternal Grandmother     Colon Cancer  . Heart disease Maternal Grandfather   . Hypertension Maternal Grandfather   . Hyperlipidemia Maternal Grandfather   . Cancer Maternal Grandfather     Lung Cancer  . Heart attack Maternal Grandfather   . Heart disease Paternal Grandfather      Prior to Admission medications   Medication Sig Start Date End Date Taking? Authorizing Provider  ALLERGIST [name] Inject 0.1 mLs into the skin once a week.  09/08/14   Historical Provider, MD  Blood Glucose Monitoring Suppl (RELION CONFIRM GLUCOSE MONITOR) W/DEVICE KIT USE AS DIRECTED TO TEST BLOOD GLUCOSE DAILY DX: 250.00 08/13/14   Brunetta Jeans, PA-C  Cholecalciferol (VITAMIN D-3) 5000 UNITS TABS Take 1 tablet by mouth daily.    Historical Provider, MD  clomiPHENE (CLOMID) 50 MG tablet Take 25 mg by mouth daily. 04/07/13   Jonathon Resides, MD  docusate sodium (COLACE) 100 MG capsule Take 1 capsule (100 mg total) by mouth 2 (two) times daily as needed for mild  constipation. 01/20/15   Susa Day, MD  glucose blood test strip RELION BLOOD GLUCOSE TEST STRIP Use as instructed to test daily Dx: 250.00 08/13/14   Brunetta Jeans, PA-C  HYDROcodone-acetaminophen [facility]) 10-325 MG per tablet Take 1-2 tablets by mouth every 6 (six) hours as needed (Pain). 01/22/15   Cecilie Kicks, PA-C  HYDROcodone-homatropine (HYCODAN) 5-1.5 MG/5ML syrup Take 5 mLs by mouth every 6 (six) hours as needed for cough. 03/26/15   Brunetta Jeans, PA-C  ibuprofen (ADVIL,MOTRIN) 200 MG tablet Take 400-600 mg by mouth every 6 (six) hours as needed (Pain).    Historical Provider, MD  ipratropium (ATROVENT) 0.06 % nasal spray Place 1 spray into both nostrils 2 (two) times daily.    Historical Provider, MD  LIVALO 4 MG TABS TAKE 1 TABLET (4 MG TOTAL) BY MOUTH DAILY. 02/01/15   Brunetta Jeans, PA-C  omeprazole (PRILOSEC) 20 MG capsule Take 1 capsule (20 mg total)  by mouth daily. 03/08/15 03/13/17  Brunetta Jeans, PA-C  potassium gluconate 595 MG TABS tablet Take 595 mg by mouth daily.    Historical Provider, MD  Wheat Dextrin (EQ FIBER POWDER PO) Take 2 each by mouth 3 (three) times daily.     Historical Provider, MD    Physical Exam: Filed Vitals:   05/06/15 2328 05/07/15 0125 05/07/15 0146 05/07/15 0430  BP: 141/78 151/85 151/85   Pulse: 78 93 96   Temp:   98.8 F (37.1 C)   TempSrc:   Oral   Resp: _0 Height:   5' 8" (1.727 m)   Weight:   125.238 kg (276 lb 1.6 oz)   SpO2: 91% 94% 92% 96%   General: Not in acute distress HEENT:       Eyes: PERRL, EOMI, no scleral icterus       ENT: No discharge from the ears and nose, no pharynx injection, no tonsillar enlargement.        Neck: No JVD, no bruit, no mass felt. Heme: No neck or axillary lymph node enlargement Cardiac: S1/S2, RRR, No murmurs, No gallops or rubs Pulm: No rales, wheezing, rhonchi or rubs. Abd: Soft, nondistended, nontender, no rebound pain, no organomegaly, BS present Ext:  2+DP/PT pulse  bilaterally. There is swelling and tenderness over left calf and left lower leg. There is no warmth. Musculoskeletal: there is tenderness over midline of lower back.  Skin: No rashes.  Neuro: Alert, oriented X3, cranial nerves II-XII grossly intact, muscle strength 5/5 in all extremities, sensation to light touch intact. Psych: Patient is not psychotic, no suicidal or hemocidal ideation.  Labs on Admission:  Basic Metabolic Panel:  Recent Labs Lab 05/06/15 1900  NA 138  K 3.9  CL 105  CO2 25  GLUCOSE 127*  BUN 18  CREATININE 1.04  CALCIUM 8.9   Liver Function Tests: No results for input(s): AST, ALT, ALKPHOS, BILITOT, PROT, ALBUMIN in the last 168 hours. No results for input(s): LIPASE, AMYLASE in the last 168 hours. No results for input(s): AMMONIA in the last 168 hours. CBC:  Recent Labs Lab 05/06/15 1900 05/07/15 0245  WBC 8.1 8.5  NEUTROABS 4.9  --   HGB 14.9 13.5  HCT 42.5 38.0*  MCV 93.0 91.8  PLT 112* 104*   Cardiac Enzymes:  Recent Labs Lab 05/06/15 1907  TROPONINI <0.03    BNP (last 3 results) No results for input(s): BNP in the last 8760 hours.  ProBNP (last 3 results) No results for input(s): PROBNP in the last 8760 hours.  CBG:  Recent Labs Lab 05/06/15 2337  GLUCAP 121*    Radiological Exams on Admission: Ct Angio Chest Pe W/cm &/or Wo Cm  05/06/2015   CLINICAL DATA:  Cough for several months. Dyspnea. Lower extremity deep venous thrombosis.  EXAM: CT ANGIOGRAPHY CHEST WITH CONTRAST  TECHNIQUE: Multidetector CT imaging of the chest was performed using the standard protocol during bolus administration of intravenous contrast. Multiplanar CT image reconstructions and MIPs were obtained to evaluate the vascular anatomy.  CONTRAST:  143m OMNIPAQUE IOHEXOL 350 MG/ML SOLN  COMPARISON:  Radiographs 01/14/2015.  FINDINGS: There is a large volume of pulmonary embolus bilaterally. This includes saddle emboli across the right and left pulmonary  arteries although no emboli are evident in the main pulmonary artery. The pulmonary emboli continued into the upper lobes and lower lobes bilaterally, somewhat less in the left upper lobe. There is CT evidence of right heart strain,  with RV to LV ratio of 1.09.  The lungs are clear. Airways are patent. There are no pleural effusions. No significant abnormality is evident in the upper abdomen.  Review of the MIP images confirms the above findings.  IMPRESSION: Positive for acute PE with CT evidence of right heart strain (RV/LV Ratio = 1.09) consistent with at least submassive (intermediate risk) PE. The presence of right heart strain has been associated with an increased risk of morbidity and mortality. Please activate Code PE by paging [phone].  These results were called by telephone at the time of interpretation on 05/06/2015 at 8:21 pm to PA. NICOLE PISCIOTTA , who verbally acknowledged these results.   Electronically Signed   By: Andreas Newport M.D.   On: 05/06/2015 20:21   US Venous Img Lower Unilateral Left  05/06/2015   ADDENDUM REPORT: 05/06/2015 19:49  CLINICAL DATA:  Swollen left calf, onset this evening. Recent pain in both posterior thighs.   Electronically Signed   By: Andreas Newport M.D.   On: 05/06/2015 19:49   05/06/2015   CLINICAL DATA:  Swallow left calf, onset this evening. Recent pain in both posterior thighs.  EXAM: Left LOWER EXTREMITY VENOUS DOPPLER ULTRASOUND  TECHNIQUE: Gray-scale sonography with graded compression, as well as color Doppler and duplex ultrasound were performed to evaluate the lower extremity deep venous systems from the level of the common femoral vein and including the common femoral, femoral, profunda femoral, popliteal and calf veins including the posterior tibial, peroneal and gastrocnemius veins when visible. The superficial great saphenous vein was also interrogated. Spectral Doppler was utilized to evaluate flow at rest and with distal augmentation  maneuvers in the common femoral, femoral and popliteal veins.  COMPARISON:  None.  FINDINGS: Contralateral Common Femoral Vein: Respiratory phasicity is normal and symmetric with the symptomatic side. No evidence of thrombus. Normal compressibility.  Common Femoral Vein: No evidence of thrombus. Normal compressibility, respiratory phasicity and response to augmentation.  Saphenofemoral Junction: No evidence of thrombus. Normal compressibility and flow on color Doppler imaging.  Profunda Femoral Vein: No evidence of thrombus. Normal compressibility and flow on color Doppler imaging.  Femoral Vein: Beginning in the midportion of the superficial femoral vein, there is distention of the deep venous system with echogenic thrombus, continuing caudally into the upper calf. This is noncompressible and exhibits typical absence of flow on Doppler imaging. This is typical of acute deep venous thrombosis.  Popliteal Vein: Distended with echogenic thrombus, typical of acute deep venous thrombosis.  Calf Veins: Positive for deep venous thrombosis.  Superficial Great Saphenous Vein: No evidence of thrombus. Normal compressibility and flow on color Doppler imaging.  Venous Reflux:  None.  Other Findings:  None.  IMPRESSION: There is acute deep venous thrombosis from the left midthigh to the upper calf. These results will be called to the ordering clinician or representative by the Radiologist Assistant, and communication documented in the PACS or zVision Dashboard.  Electronically Signed: By: Andreas Newport M.D. On: 05/06/2015 18:45    EKG: Independently reviewed.  Abnormal findings:     Nonspecific T-wave changes in inferior leads, left axis deviation   Assessment/Plan Principal Problem:   Pulmonary embolism Active Problems:   Hyperlipidemia   Testicular hypofunction   GERD (gastroesophageal reflux disease)   Essential hypertension, benign   Thrombocytopenia   Type 2 diabetes mellitus without complication    Obesity   Spinal stenosis of lumbar region   Spinal stenosis of lumbar region at multiple levels   Lower  leg DVT (deep venous thromboembolism), acute  Pulmonary Embolus: Bilateral PE with R heart strain seen on CT. Currently hemodynamically stable. Patient does not have have a family history of blood clot. Triggering factors are not clear. Patient is taking clomiphene, which may increase the risk of getting blood clots.  -Initially admitted to stepdown. Due to lack of bed, changed to tele bed with permission of PCCM. -heparin drip initiated -Pain control: When necessary Dilaudid, Norco -2D echocardiogram ordered -LE dopplers for right leg to evaluate for DVT -trop x 3 -checking hypercoag panel -PCCM consulted by ED, will follow up recommendations  Left leg DVT:  -on IV heparin gtt  Diet controlled diabetes: A1c was 8.9 on 07/29/14. Well controlled. Patient is not on medications at home -Sliding-scale insulin  Hyperlipidemia: LDL was 43 on 03/08/15. Patient was on livalo at home -Switched to pravastatin in hospital  GERD: -Protonix  Testicular hypofunction: Patient is on clomiphene at home. -Will hold clomiphene  Back pain: s/p of lumbar laminectomy.  -When necessary Tylenol, Norco, Dilaudid   DVT ppx: on IV Heparin      Code Status: Full code Family Communication:   Yes, patient's  wife     at bed side Disposition Plan: Admit to inpatient   Date of Service 05/07/2015    Ivor Costa [facility] Pager [phone]  If 7PM-7AM, please contact night-coverage [email or web address] Password [facility] 05/07/2015, 4:49 AM

## 2015-05-08 ENCOUNTER — Inpatient Hospital Stay (HOSPITAL_COMMUNITY): Payer: BLUE CROSS/BLUE SHIELD

## 2015-05-08 DIAGNOSIS — I10 Essential (primary) hypertension: Secondary | ICD-10-CM

## 2015-05-08 DIAGNOSIS — I2699 Other pulmonary embolism without acute cor pulmonale: Secondary | ICD-10-CM

## 2015-05-08 DIAGNOSIS — I824Z2 Acute embolism and thrombosis of unspecified deep veins of left distal lower extremity: Secondary | ICD-10-CM

## 2015-05-08 LAB — BASIC METABOLIC PANEL
Anion gap: 7 (ref 5–15)
BUN: 13 mg/dL (ref 6–20)
CO2: 26 mmol/L (ref 22–32)
CREATININE: 1.2 mg/dL (ref 0.61–1.24)
Calcium: 8.4 mg/dL — ABNORMAL LOW (ref 8.9–10.3)
Chloride: 103 mmol/L (ref 101–111)
GFR calc non Af Amer: >60 mL/min (ref >60)
GLUCOSE: 129 mg/dL — AB (ref 65–99)
Potassium: 4.2 mmol/L (ref 3.5–5.1)
SODIUM: 136 mmol/L (ref 135–145)

## 2015-05-08 LAB — CBC
HEMATOCRIT: 37.2 % — AB (ref 39.0–52.0)
Hemoglobin: 12.5 g/dL — ABNORMAL LOW (ref 13.0–17.0)
MCH: 31.3 pg (ref 26.0–34.0)
MCHC: 33.6 g/dL (ref 30.0–36.0)
MCV: 93 fL (ref 78.0–100.0)
Platelets: 99 10*3/uL — ABNORMAL LOW (ref 150–400)
RBC: 4 MIL/uL — AB (ref 4.22–5.81)
RDW: 13.4 % (ref 11.5–15.5)
WBC: 6.8 10*3/uL (ref 4.0–10.5)

## 2015-05-08 LAB — BETA-2-GLYCOPROTEIN I ABS, IGG/M/A
Beta-2 Glyco I IgG: <9 GPI IgG units (ref 0–20)
Beta-2-Glycoprotein I IgA: <9 GPI IgA units (ref 0–25)

## 2015-05-08 LAB — GLUCOSE, CAPILLARY
Glucose-Capillary: 136 mg/dL — ABNORMAL HIGH (ref 65–99)
Glucose-Capillary: 146 mg/dL — ABNORMAL HIGH (ref 65–99)
Glucose-Capillary: 145 mg/dL — ABNORMAL HIGH (ref 65–99)

## 2015-05-08 LAB — HEPARIN LEVEL (UNFRACTIONATED): Heparin Unfractionated: 0.33 IU/mL (ref 0.30–0.70)

## 2015-05-08 MED ORDER — PERFLUTREN LIPID MICROSPHERE
1.0000 mL | INTRAVENOUS | Status: AC | PRN
  Administered 2015-05-08: 2 mL via INTRAVENOUS
  Filled 2015-05-08: qty 10

## 2015-05-08 MED ORDER — HEPARIN (PORCINE) IN NACL 100-0.45 UNIT/ML-% IJ SOLN
1800.0000 [IU]/h | INTRAMUSCULAR | Status: DC
  Administered 2015-05-08 – 2015-05-10 (×4): 1800 [IU]/h via INTRAVENOUS
  Filled 2015-05-08 (×4): qty 250

## 2015-05-08 MED ORDER — CHLORHEXIDINE GLUCONATE CLOTH 2 % EX PADS
6.0000 | MEDICATED_PAD | Freq: Every day | CUTANEOUS | Status: DC
  Administered 2015-05-08: 6 via TOPICAL

## 2015-05-08 MED ORDER — MUPIROCIN 2 % EX OINT: 1.0000 "application " | TOPICAL_OINTMENT | Freq: Two times a day (BID) | CUTANEOUS | Status: DC

## 2015-05-08 NOTE — Progress Notes (Signed)
  Echocardiogram 2D Echocardiogram with Definity has been performed.  Ian [name] 05/08/2015, 11:12 AM

## 2015-05-08 NOTE — Progress Notes (Signed)
[facility] PROGRESS NOTE  Ian RITCHEY WVP:[id number] DOB: April 03, 1961 DOA: 05/06/2015 PCP: Leeanne Rio, PA-C  Assessment/Plan: 1. Pulmonary embolism. -Patient presented with complaints of shortness of breath, poor tolerance to physical exertion, with CT scan of lungs showing large volume pulmonary embolus bilaterally. CT scan showing evidence of right heart strain. -Transthoracic echocardiogram is pending -Patient is being treated with intravenous heparin -He remains hemodynamically stable, blood pressures are intact. Will continue monitoring under telemetry. -On transthoracic echocardiogram right ventricle not well seen however report suggested there was no definite significant dilatation and that's RV function was intact. -I discussed case with Dr. Tamala Julian of pulmonary critical care medicine who recommended patient remained on IV heparin over the weekend and consider transitioning to oral anticoagulation on Monday given significant clot burden.  2. Left lower extremity DVT. -Patient presented with complaints of left calf pain, erythema, swelling. Venous Doppler showing acute deep venous thrombosis in the left mid thigh to the upper calf. -Continue intravenous heparin  3. Chronic back pain. -Status post lumbar laminectomy  4.  Diet-controlled diabetes mellitus.  -Blood sugars are stable  5.  History of hypertension. -Patient presenting with large PE. Blood pressures are stable having last BP of 117/64 off of antihypertensive agents.  Code Status: Full code Family Communication: I spoke to his wife was present at bedside Disposition Plan: Anticipate discharge home when medically stable   Consultants:  PCCM   HPI/Subjective: Patient is a pleasant 54 year old gentleman with a past medical history of dyslipidemia,.-controlled diabetes mellitus, undergoing laminectomy in January 2016, presented to the emergency department on 05/07/2015 with complaints of shortness of  breath associated with left calf pain. He reports back surgery limiting activity. Workup in the emergency department included a CT scan with IV contrast that showed acute pulmonary embolism with CT evidence of right heart strain. He was started on IV heparin.   Objective: Filed Vitals:   05/08/15 1430  BP: 135/75  Pulse: 68  Temp: 98 F (36.7 C)  Resp: 18    Intake/Output Summary (Last 24 hours) at 05/08/15 1603 Last data filed at 05/08/15 1324  Gross per 24 hour  Intake    870 ml  Output   1226 ml  Net   -356 ml   Filed Weights   05/06/15 1622 05/07/15 0146 05/08/15 0430  Weight: 120.203 kg (265 lb) 125.238 kg (276 lb 1.6 oz) 128.187 kg (282 lb 9.6 oz)    Exam:   General:  Patient is in no acute distress, sitting comfortably, currently denies shortness of breath or chest pain.  Cardiovascular: Regular rate and rhythm normal S1-S2  Respiratory: Lungs overall clear to auscultation bilaterally no wheezing rhonchi or rales  Abdomen: Soft nontender nondistended positive bowel sounds  Musculoskeletal: Patient having pain and induration over his left calf region  Data Reviewed: Basic Metabolic Panel:  Recent Labs Lab 05/06/15 1900 05/07/15 0615 05/08/15 0340  NA 138 138 136  K 3.9 3.8 4.2  CL 105 107 103  CO2 25 22 26   GLUCOSE 127* 144* 129*  BUN 18 14 13   CREATININE 1.04 1.15 1.20  CALCIUM 8.9 8.3* 8.4*   Liver Function Tests:  Recent Labs Lab 05/07/15 0615  AST 23  ALT 18  ALKPHOS 37*  BILITOT 0.6  PROT 5.1*  ALBUMIN 2.8*   No results for input(s): LIPASE, AMYLASE in the last 168 hours. No results for input(s): AMMONIA in the last 168 hours. CBC:  Recent Labs Lab 05/06/15 1900 05/07/15 0245 05/07/15 5462  05/08/15 0340  WBC 8.1 8.5 9.0 6.8  NEUTROABS 4.9  --   --   --   HGB 14.9 13.5 13.1 12.5*  HCT 42.5 38.0* 37.9* 37.2*  MCV 93.0 91.8 92.7 93.0  PLT 112* 104* 94* 99*   Cardiac Enzymes:  Recent Labs Lab 05/06/15 1907 05/07/15 0601  05/07/15 1439  TROPONINI <0.03 <0.03 <0.03   BNP (last 3 results) No results for input(s): BNP in the last 8760 hours.  ProBNP (last 3 results) No results for input(s): PROBNP in the last 8760 hours.  CBG:  Recent Labs Lab 05/07/15 1159 05/07/15 1709 05/07/15 2103 05/08/15 0741 05/08/15 1107  GLUCAP 124* 195* 146* 136* 146*    Recent Results (from the past 240 hour(s))  MRSA PCR Screening     Status: Abnormal   Collection Time: 05/07/15  1:19 AM  Result Value Ref Range Status   MRSA by PCR POSITIVE (A) NEGATIVE Final    Comment:        The GeneXpert MRSA Assay (FDA approved for NASAL specimens only), is one component of a comprehensive MRSA colonization surveillance program. It is not intended to diagnose MRSA infection nor to guide or monitor treatment for MRSA infections. RESULT CALLED TO, READ BACK BY AND VERIFIED WITH: RN,BRITTANY BUCKNER [id number] @0335  THANEY      Studies: Ct Angio Chest Pe W/cm &/or Wo Cm  05/06/2015   CLINICAL DATA:  Cough for several months. Dyspnea. Lower extremity deep venous thrombosis.  EXAM: CT ANGIOGRAPHY CHEST WITH CONTRAST  TECHNIQUE: Multidetector CT imaging of the chest was performed using the standard protocol during bolus administration of intravenous contrast. Multiplanar CT image reconstructions and MIPs were obtained to evaluate the vascular anatomy.  CONTRAST:  195mL OMNIPAQUE IOHEXOL 350 MG/ML SOLN  COMPARISON:  Radiographs 01/14/2015.  FINDINGS: There is a large volume of pulmonary embolus bilaterally. This includes saddle emboli across the right and left pulmonary arteries although no emboli are evident in the main pulmonary artery. The pulmonary emboli continued into the upper lobes and lower lobes bilaterally, somewhat less in the left upper lobe. There is CT evidence of right heart strain, with RV to LV ratio of 1.09.  The lungs are clear. Airways are patent. There are no pleural effusions. No significant abnormality is evident  in the upper abdomen.  Review of the MIP images confirms the above findings.  IMPRESSION: Positive for acute PE with CT evidence of right heart strain (RV/LV Ratio = 1.09) consistent with at least submassive (intermediate risk) PE. The presence of right heart strain has been associated with an increased risk of morbidity and mortality. Please activate Code PE by paging [phone].  These results were called by telephone at the time of interpretation on 05/06/2015 at 8:21 pm to PA. NICOLE PISCIOTTA , who verbally acknowledged these results.   Electronically Signed   By: Andreas Newport M.D.   On: 05/06/2015 20:21   US Venous Img Lower Unilateral Left  05/06/2015   ADDENDUM REPORT: 05/06/2015 19:49  CLINICAL DATA:  Swollen left calf, onset this evening. Recent pain in both posterior thighs.   Electronically Signed   By: Andreas Newport M.D.   On: 05/06/2015 19:49   05/06/2015   CLINICAL DATA:  Swallow left calf, onset this evening. Recent pain in both posterior thighs.  EXAM: Left LOWER EXTREMITY VENOUS DOPPLER ULTRASOUND  TECHNIQUE: Gray-scale sonography with graded compression, as well as color Doppler and duplex ultrasound were performed to evaluate the lower extremity deep venous systems  from the level of the common femoral vein and including the common femoral, femoral, profunda femoral, popliteal and calf veins including the posterior tibial, peroneal and gastrocnemius veins when visible. The superficial great saphenous vein was also interrogated. Spectral Doppler was utilized to evaluate flow at rest and with distal augmentation maneuvers in the common femoral, femoral and popliteal veins.  COMPARISON:  None.  FINDINGS: Contralateral Common Femoral Vein: Respiratory phasicity is normal and symmetric with the symptomatic side. No evidence of thrombus. Normal compressibility.  Common Femoral Vein: No evidence of thrombus. Normal compressibility, respiratory phasicity and response to augmentation.   Saphenofemoral Junction: No evidence of thrombus. Normal compressibility and flow on color Doppler imaging.  Profunda Femoral Vein: No evidence of thrombus. Normal compressibility and flow on color Doppler imaging.  Femoral Vein: Beginning in the midportion of the superficial femoral vein, there is distention of the deep venous system with echogenic thrombus, continuing caudally into the upper calf. This is noncompressible and exhibits typical absence of flow on Doppler imaging. This is typical of acute deep venous thrombosis.  Popliteal Vein: Distended with echogenic thrombus, typical of acute deep venous thrombosis.  Calf Veins: Positive for deep venous thrombosis.  Superficial Great Saphenous Vein: No evidence of thrombus. Normal compressibility and flow on color Doppler imaging.  Venous Reflux:  None.  Other Findings:  None.  IMPRESSION: There is acute deep venous thrombosis from the left midthigh to the upper calf. These results will be called to the ordering clinician or representative by the Radiologist Assistant, and communication documented in the PACS or zVision Dashboard.  Electronically Signed: By: Andreas Newport M.D. On: 05/06/2015 18:45    Scheduled Meds: . Chlorhexidine Gluconate Cloth  6 each Topical Q0600  . cholecalciferol  5,000 Units Oral Daily  . insulin aspart  0-9 Units Subcutaneous TID WC  . ipratropium  1 spray Each Nare BID  . mupirocin ointment  1 application Nasal BID  . pantoprazole  40 mg Oral Daily  . polycarbophil  625 mg Oral BID WC  . pravastatin  80 mg Oral q1800  . sodium chloride  3 mL Intravenous Q12H   Continuous Infusions: . sodium chloride 75 mL/hr at 05/08/15 0700  . heparin 1,800 Units/hr (05/08/15 1404)    Principal Problem:   Pulmonary embolism Active Problems:   Hyperlipidemia   Testicular hypofunction   GERD (gastroesophageal reflux disease)   Essential hypertension, benign   Thrombocytopenia   Type 2 diabetes mellitus without  complication   Obesity   Spinal stenosis of lumbar region   Spinal stenosis of lumbar region at multiple levels   Lower leg DVT (deep venous thromboembolism), acute    Time spent: 20 minutes    Kelvin Cellar  [facility] Pager [phone]. If 7PM-7AM, please contact night-coverage at [email or web address], password [email or web address] 05/08/2015, 4:03 PM  LOS: 2 days

## 2015-05-08 NOTE — Progress Notes (Signed)
ANTICOAGULATION CONSULT NOTE - Follow Up Consult  Pharmacy Consult for Heparin Indication: pulmonary embolus and DVT  Allergies  Allergen Reactions  . Tetanus Toxoids Anaphylaxis  . Daptomycin Other (See Comments)    myalgias  . Citrus Diarrhea    All citrus fruits  . Milk-Related Compounds Diarrhea    Milk and milk products  . Oxycodone   . Asa [Aspirin] Nausea Only and Rash  . Penicillins Rash  . Prednisone (Pak) [Prednisone] Other (See Comments)    Hyperactivity WITH ORAL, CAN TAKE SHOTS    Patient Measurements: Height: 5\' 8"  (172.7 cm) Weight: 282 lb 9.6 oz (128.187 kg) IBW/kg (Calculated) : 68.4 Heparin Dosing Weight: ~97kg  Vital Signs: Temp: 98.4 F (36.9 C) (05/14 0430) Temp Source: Oral (05/14 0430) BP: 107/57 mmHg (05/14 0430) Pulse Rate: 62 (05/14 0430)  Labs:  Recent Labs  05/06/15 1900 05/06/15 1907  05/07/15 0245 05/07/15 0601 05/07/15 0615 05/07/15 1052 05/07/15 1439 05/07/15 1624 05/08/15 0340  HGB 14.9  --   --  13.5  --  13.1  --   --   --  12.5*  HCT 42.5  --   --  38.0*  --  37.9*  --   --   --  37.2*  PLT 112*  --   --  104*  --  94*  --   --   --  99*  APTT 31  --   --   --   --   --   --   --   --   --   LABPROT 13.8  --   --   --   --   --   --   --   --   --   INR 1.06  --   --   --   --   --   --   --   --   --   HEPARINUNFRC  --   --   < > 0.24*  --   --  0.48  --  0.36 0.33  CREATININE 1.04  --   --   --   --  1.15  --   --   --  1.20  TROPONINI  --  <0.03  --   --  <0.03  --   --  <0.03  --   --   < > = values in this interval not displayed.  Estimated Creatinine Clearance: 90.8 mL/min (by C-G formula based on Cr of 1.2).   Medications:  Heparin @ 1750 units/hr  Assessment: 53yom continues on heparin for bilateral PE (per CT 5/12) and LLE DVT (diagnosed at [facility]). Heparin level is therapeutic on the lower end of goal. Platelets are low at 99 (112 on admit) but improved from yesterday. No bleeding reported.  Goal of  Therapy:  Heparin level 0.3-0.7 units/ml Monitor platelets by anticoagulation protocol: Yes   Plan:  1) Increase heparin slightly to 1800 units/hr to keep therapeutic 2) Follow up heparin level and CBC in AM  Deboraha Sprang 05/08/2015,7:46 AM

## 2015-05-09 LAB — BASIC METABOLIC PANEL
Anion gap: 8 (ref 5–15)
BUN: 10 mg/dL (ref 6–20)
CALCIUM: 8.6 mg/dL — AB (ref 8.9–10.3)
CO2: 27 mmol/L (ref 22–32)
CREATININE: 1.18 mg/dL (ref 0.61–1.24)
Chloride: 105 mmol/L (ref 101–111)
GFR calc Af Amer: >60 mL/min (ref >60)
GFR calc non Af Amer: >60 mL/min (ref >60)
Glucose, Bld: 132 mg/dL — ABNORMAL HIGH (ref 65–99)
POTASSIUM: 4.3 mmol/L (ref 3.5–5.1)
SODIUM: 140 mmol/L (ref 135–145)

## 2015-05-09 LAB — CBC
HCT: 38.3 % — ABNORMAL LOW (ref 39.0–52.0)
HEMOGLOBIN: 12.9 g/dL — AB (ref 13.0–17.0)
MCH: 31.2 pg (ref 26.0–34.0)
MCHC: 33.7 g/dL (ref 30.0–36.0)
MCV: 92.5 fL (ref 78.0–100.0)
Platelets: 106 10*3/uL — ABNORMAL LOW (ref 150–400)
RBC: 4.14 MIL/uL — AB (ref 4.22–5.81)
RDW: 13.2 % (ref 11.5–15.5)
WBC: 6.2 10*3/uL (ref 4.0–10.5)

## 2015-05-09 LAB — GLUCOSE, CAPILLARY
Glucose-Capillary: 205 mg/dL — ABNORMAL HIGH (ref 65–99)
Glucose-Capillary: 133 mg/dL — ABNORMAL HIGH (ref 65–99)
Glucose-Capillary: 158 mg/dL — ABNORMAL HIGH (ref 65–99)
Glucose-Capillary: 147 mg/dL — ABNORMAL HIGH (ref 65–99)
Glucose-Capillary: 173 mg/dL — ABNORMAL HIGH (ref 65–99)

## 2015-05-09 LAB — PROTEIN S ACTIVITY: PROTEIN S ACTIVITY: 89 % (ref 60–145)

## 2015-05-09 LAB — CARDIOLIPIN ANTIBODIES, IGG, IGM, IGA: Anticardiolipin IgM: <9 MPL U/mL (ref 0–12)

## 2015-05-09 LAB — PROTEIN C, TOTAL: PROTEIN C, TOTAL: 62 % — AB (ref 70–140)

## 2015-05-09 LAB — HEPARIN LEVEL (UNFRACTIONATED): HEPARIN UNFRACTIONATED: 0.33 [IU]/mL (ref 0.30–0.70)

## 2015-05-09 LAB — HOMOCYSTEINE: HOMOCYSTEINE-NORM: 12.2 umol/L (ref 0.0–15.0)

## 2015-05-09 LAB — PROTEIN C ACTIVITY: PROTEIN C ACTIVITY: 80 % (ref 74–151)

## 2015-05-09 LAB — PROTEIN S, TOTAL: Protein S Ag, Total: 92 % (ref 58–150)

## 2015-05-09 MED ORDER — BISACODYL 10 MG RE SUPP: 10.0000 mg | Freq: Every day | RECTAL | Status: DC | PRN

## 2015-05-09 MED ORDER — POLYETHYLENE GLYCOL 3350 17 G PO PACK
17.0000 g | PACK | Freq: Every day | ORAL | Status: DC
  Filled 2015-05-09: qty 1

## 2015-05-09 MED ORDER — DOCUSATE SODIUM 100 MG PO CAPS
100.0000 mg | ORAL_CAPSULE | Freq: Two times a day (BID) | ORAL | Status: DC
  Administered 2015-05-09 – 2015-05-10 (×3): 100 mg via ORAL
  Filled 2015-05-09 (×3): qty 1

## 2015-05-09 NOTE — Progress Notes (Signed)
[facility] PROGRESS NOTE  [name] CHE:[id number] DOB: 10/22/1961 DOA: 05/06/2015 PCP: Leeanne Rio, PA-C  Assessment/Plan: 1. Pulmonary embolism. -Patient presented with complaints of shortness of breath, poor tolerance to physical exertion, with CT scan of lungs showing large volume pulmonary embolus bilaterally. CT scan showing evidence of right heart strain. -Transthoracic echocardiogram is pending -Patient is being treated with intravenous heparin -He remains hemodynamically stable, blood pressures are intact. Will continue monitoring under telemetry. -On transthoracic echocardiogram right ventricle not well seen however report suggested there was no definite significant dilatation and that's RV function was intact. -I discussed case with Dr. Tamala Julian of pulmonary critical care medicine who recommended patient remained on IV heparin over the weekend and consider transitioning to oral anticoagulation on Monday given significant clot burden. -Remains hemodynamically stable  2. Left lower extremity DVT. -Patient presented with complaints of left calf pain, erythema, swelling. Venous Doppler showing acute deep venous thrombosis in the left mid thigh to the upper calf. -Continue intravenous heparin -As mentioned above we plan to transition to oral anticoagulation tomorrow morning.   3. Chronic back pain. -Status post lumbar laminectomy  4.  Diet-controlled diabetes mellitus.  -Blood sugars controlled  5.  History of hypertension. -Patient presenting with large PE. Blood pressures are stable having last BP of 117/64 off of antihypertensive agents.  Code Status: Full code Family Communication: I spoke to his wife was present at bedside Disposition Plan: Anticipate discharge home in the next 24-48 hours if he remains stable   Consultants:  PCCM   HPI/Subjective: Patient is a pleasant 54 year old Ian [name] with a past medical history of dyslipidemia,.-controlled  diabetes mellitus, undergoing laminectomy in January 2016, presented to the emergency department on 05/07/2015 with complaints of shortness of breath associated with left calf pain. He reports back surgery limiting activity. Workup in the emergency department included a CT scan with IV contrast that showed acute pulmonary embolism with CT evidence of right heart strain. He was started on IV heparin.   Objective: Filed Vitals:   05/09/15 0417  BP: 120/66  Pulse: 63  Temp: 98.5 F (36.9 C)  Resp: 16    Intake/Output Summary (Last 24 hours) at 05/09/15 1234 Last data filed at 05/09/15 1200  Gross per 24 hour  Intake  536.5 ml  Output   2451 ml  Net -1914.5 ml   Filed Weights   05/07/15 0146 05/08/15 0430 05/09/15 0417  Weight: 125.238 kg (276 lb 1.6 oz) 128.187 kg (282 lb 9.6 oz) 124.739 kg (275 lb)    Exam:   General:  Patient is in no acute distress, sitting comfortably, currently denies shortness of breath or chest pain.  Cardiovascular: Regular rate and rhythm normal S1-S2  Respiratory: Lungs overall clear to auscultation bilaterally no wheezing rhonchi or rales  Abdomen: Soft nontender nondistended positive bowel sounds  Musculoskeletal: Patient having pain and induration over his left calf region  Data Reviewed: Basic Metabolic Panel:  Recent Labs Lab 05/06/15 1900 05/07/15 0615 05/08/15 0340 05/09/15 0400  NA 138 138 136 140  K 3.9 3.8 4.2 4.3  CL 105 107 103 105  CO2 Ian 22 26 27   GLUCOSE 127* 144* 129* 132*  BUN 18 14 13 10   CREATININE 1.04 1.15 1.20 1.18  CALCIUM 8.9 8.3* 8.4* 8.6*   Liver Function Tests:  Recent Labs Lab 05/07/15 0615  AST 23  ALT 18  ALKPHOS 37*  BILITOT 0.6  PROT 5.1*  ALBUMIN 2.8*   No results for input(s): LIPASE,  AMYLASE in the last 168 hours. No results for input(s): AMMONIA in the last 168 hours. CBC:  Recent Labs Lab 05/06/15 1900 05/07/15 0245 05/07/15 0615 05/08/15 0340 05/09/15 0400  WBC 8.1 8.5 9.0 6.8  6.2  NEUTROABS 4.9  --   --   --   --   HGB 14.9 13.5 13.1 12.5* 12.9*  HCT 42.5 38.0* 37.9* 37.2* 38.3*  MCV 93.0 91.8 92.7 93.0 92.5  PLT 112* 104* 94* 99* 106*   Cardiac Enzymes:  Recent Labs Lab 05/06/15 1907 05/07/15 0601 05/07/15 1439  TROPONINI <0.03 <0.03 <0.03   BNP (last 3 results) No results for input(s): BNP in the last 8760 hours.  ProBNP (last 3 results) No results for input(s): PROBNP in the last 8760 hours.  CBG:  Recent Labs Lab 05/08/15 1107 05/08/15 1757 05/08/15 2055 05/09/15 0718 05/09/15 1146  GLUCAP 146* 205* 145* 133* 158*    Recent Results (from the past 240 hour(s))  MRSA PCR Screening     Status: Abnormal   Collection Time: 05/07/15  1:19 AM  Result Value Ref Range Status   MRSA by PCR POSITIVE (A) NEGATIVE Final    Comment:        The GeneXpert MRSA Assay (FDA approved for NASAL specimens only), is one component of a comprehensive MRSA colonization surveillance program. It is not intended to diagnose MRSA infection nor to guide or monitor treatment for MRSA infections. RESULT CALLED TO, READ BACK BY AND VERIFIED WITH: RN,BRITTANY BUCKNER [phone] @0335  THANEY      Studies: No results found.  Scheduled Meds: . Chlorhexidine Gluconate Cloth  6 each Topical Q0600  . cholecalciferol  5,000 Units Oral Daily  . docusate sodium  100 mg Oral BID  . insulin aspart  0-9 Units Subcutaneous TID WC  . ipratropium  1 spray Each Nare BID  . mupirocin ointment  1 application Nasal BID  . pantoprazole  40 mg Oral Daily  . polycarbophil  625 mg Oral BID WC  . polyethylene glycol  17 g Oral Daily  . pravastatin  80 mg Oral q1800  . sodium chloride  3 mL Intravenous Q12H   Continuous Infusions: . heparin 1,800 Units/hr (05/09/15 0600)    Principal Problem:   Pulmonary embolism Active Problems:   Hyperlipidemia   Testicular hypofunction   GERD (gastroesophageal reflux disease)   Essential hypertension, benign   Thrombocytopenia    Type 2 diabetes mellitus without complication   Obesity   Spinal stenosis of lumbar region   Spinal stenosis of lumbar region at multiple levels   Lower leg DVT (deep venous thromboembolism), acute    Time spent: 20 minutes    Kelvin Cellar  [facility] Pager [phone]. If 7PM-7AM, please contact night-coverage at [email or web address], password [facility] 05/09/2015, 12:34 PM  LOS: 3 days

## 2015-05-09 NOTE — Progress Notes (Signed)
ANTICOAGULATION CONSULT NOTE - Follow Up Consult  Pharmacy Consult for Heparin Indication: pulmonary embolus and DVT  Allergies  Allergen Reactions  . Tetanus Toxoids Anaphylaxis  . Daptomycin Other (See Comments)    myalgias  . Citrus Diarrhea    All citrus fruits  . Milk-Related Compounds Diarrhea    Milk and milk products  . Oxycodone   . Asa [Aspirin] Nausea Only and Rash  . Penicillins Rash  . Prednisone (Pak) [Prednisone] Other (See Comments)    Hyperactivity WITH ORAL, CAN TAKE SHOTS    Patient Measurements: Height: 5\' 8"  (172.7 cm) Weight: 275 lb (124.739 kg) IBW/kg (Calculated) : 68.4 Heparin Dosing Weight: ~97kg  Vital Signs: Temp: 98.5 F (36.9 C) (05/15 0417) Temp Source: Oral (05/15 0417) BP: 120/66 mmHg (05/15 0417) Pulse Rate: 63 (05/15 0417)  Labs:  Recent Labs  05/06/15 1900 05/06/15 1907  05/07/15 0601 05/07/15 0615  05/07/15 1439 05/07/15 1624 05/08/15 0340 05/09/15 0400  HGB 14.9  --   < >  --  13.1  --   --   --  12.5* 12.9*  HCT 42.5  --   < >  --  37.9*  --   --   --  37.2* 38.3*  PLT 112*  --   < >  --  94*  --   --   --  99* 106*  APTT 31  --   --   --   --   --   --   --   --   --   LABPROT 13.8  --   --   --   --   --   --   --   --   --   INR 1.06  --   --   --   --   --   --   --   --   --   HEPARINUNFRC  --   --   < >  --   --   < >  --  0.36 0.33 0.33  CREATININE 1.04  --   --   --  1.15  --   --   --  1.20 1.18  TROPONINI  --  <0.03  --  <0.03  --   --  <0.03  --   --   --   < > = values in this interval not displayed.  Estimated Creatinine Clearance: 90.9 mL/min (by C-G formula based on Cr of 1.18).   Medications:  Heparin @ 1800 units/hr  Assessment: Ian [name] continues on heparin for bilateral PE (per CT 5/12) and LLE DVT (diagnosed at [facility]). Heparin level is therapeutic. Platelets are low but improving 94>99>106 (112 on admit) - will watch closely. No bleeding reported.  Goal of Therapy:  Heparin level 0.3-0.7  units/ml Monitor platelets by anticoagulation protocol: Yes   Plan:  1) Continue heparin at 1800 units/hr 2) Daily heparin level and CBC  Deboraha Sprang 05/09/2015,7:33 AM

## 2015-05-10 DIAGNOSIS — K219 Gastro-esophageal reflux disease without esophagitis: Secondary | ICD-10-CM

## 2015-05-10 DIAGNOSIS — I2699 Other pulmonary embolism without acute cor pulmonale: Secondary | ICD-10-CM | POA: Insufficient documentation

## 2015-05-10 LAB — GLUCOSE, CAPILLARY
GLUCOSE-CAPILLARY: 107 mg/dL — AB (ref 65–99)
GLUCOSE-CAPILLARY: 125 mg/dL — AB (ref 65–99)
Glucose-Capillary: 110 mg/dL — ABNORMAL HIGH (ref 65–99)

## 2015-05-10 LAB — HEPARIN LEVEL (UNFRACTIONATED): Heparin Unfractionated: 0.41 IU/mL (ref 0.30–0.70)

## 2015-05-10 LAB — PTT-LA INCUB MIX: PTT-LA INCUB MIX: 51.8 s — AB (ref 0.0–50.0)

## 2015-05-10 LAB — CBC
HEMATOCRIT: 38.2 % — AB (ref 39.0–52.0)
HEMOGLOBIN: 13.2 g/dL (ref 13.0–17.0)
MCH: 31.7 pg (ref 26.0–34.0)
MCHC: 34.6 g/dL (ref 30.0–36.0)
MCV: 91.8 fL (ref 78.0–100.0)
Platelets: 136 10*3/uL — ABNORMAL LOW (ref 150–400)
RBC: 4.16 MIL/uL — ABNORMAL LOW (ref 4.22–5.81)
RDW: 13.2 % (ref 11.5–15.5)
WBC: 6.4 10*3/uL (ref 4.0–10.5)

## 2015-05-10 LAB — LUPUS ANTICOAGULANT PANEL
DRVVT: 44.9 s (ref 0.0–55.1)
PTT LA: 57.9 s — AB (ref 0.0–50.0)

## 2015-05-10 LAB — HEXAGONAL PHASE PHOSPHOLIPID: Hexagonal Phase Phospholipid: <1 s (ref 0.0–8.0)

## 2015-05-10 LAB — PTT-LA MIX: PTT-LA MIX: 45.9 s (ref 0.0–50.0)

## 2015-05-10 MED ORDER — RIVAROXABAN 15 MG PO TABS
15.0000 mg | ORAL_TABLET | Freq: Two times a day (BID) | ORAL | Status: DC
  Administered 2015-05-10: 15 mg via ORAL
  Filled 2015-05-10: qty 1

## 2015-05-10 MED ORDER — RIVAROXABAN 15 MG PO TABS: ORAL_TABLET | ORAL | Status: DC

## 2015-05-10 MED ORDER — RIVAROXABAN 20 MG PO TABS: 20.0000 mg | ORAL_TABLET | Freq: Every day | ORAL | Status: DC

## 2015-05-10 NOTE — Discharge Instructions (Signed)
Information on my medicine - XARELTO (rivaroxaban)  This medication education was reviewed with me or my healthcare representative as part of my discharge preparation.  The pharmacist that spoke with me during my hospital stay was:  Wayland Salinas, [place]? Xarelto was prescribed to treat blood clots that may have been found in the veins of your legs (deep vein thrombosis) or in your lungs (pulmonary embolism) and to reduce the risk of them occurring again.  What do you need to know about Xarelto? The starting dose is one 15 mg tablet taken TWICE daily with food for the FIRST 21 DAYS then on (enter date)  05/31/15  the dose is changed to one 20 mg tablet taken ONCE A DAY with your evening meal.  DO NOT stop taking Xarelto without talking to the health care provider who prescribed the medication.  Refill your prescription for 20 mg tablets before you run out.  After discharge, you should have regular check-up appointments with your healthcare provider that is prescribing your Xarelto.  In the future your dose may need to be changed if your kidney function changes by a significant amount.  What do you do if you miss a dose? If you are taking Xarelto TWICE DAILY and you miss a dose, take it as soon as you remember. You may take two 15 mg tablets (total 30 mg) at the same time then resume your regularly scheduled 15 mg twice daily the next day.  If you are taking Xarelto ONCE DAILY and you miss a dose, take it as soon as you remember on the same day then continue your regularly scheduled once daily regimen the next day. Do not take two doses of Xarelto at the same time.   Important Safety Information Xarelto is a blood thinner medicine that can cause bleeding. You should call your healthcare provider right away if you experience any of the following: ? Bleeding from an injury or your nose that does not stop. ? Unusual colored urine (red or dark  brown) or unusual colored stools (red or black). ? Unusual bruising for unknown reasons. ? A serious fall or if you hit your head (even if there is no bleeding).  Some medicines may interact with Xarelto and might increase your risk of bleeding while on Xarelto. To help avoid this, consult your healthcare provider or pharmacist prior to using any new prescription or non-prescription medications, including herbals, vitamins, non-steroidal anti-inflammatory drugs (NSAIDs) and supplements.  This website has more information on Xarelto: [email or web address]

## 2015-05-10 NOTE — Care Management Note (Signed)
Case Management Note  Patient Details  Name: Ian [name] MRN: [id number] Date of Birth: 1961-10-27  Subjective/Objective:  Benefits check was in process for xarelto: Per insurance- [employer] is covered. No Prior Authorization required. Patient will have no co-payment for this medication. Available at [employer]. CM did call [facility] on [address] in [place] and medication is available. CM will provide pt with the 30 day free xarelto card along with the year supply card at no cost. Pt will need Rx for 30 day free.                   Action/Plan: No further needs from CM at this time.    Expected Discharge Date:                  Expected Discharge Plan:     In-House Referral:     Discharge planning Services     Post Acute Care Choice:    Choice offered to:     DME Arranged:    DME Agency:     HH Arranged:    [place] Agency:     Status of Service:     Medicare Important Message Given:    Date Medicare IM Given:    Medicare IM give by:    Date Additional Medicare IM Given:    Additional Medicare Important Message give by:     If discussed at [place] of Stay Meetings, dates discussed:    Additional Comments:  Bethena Roys, RN 05/10/2015, 2:50 PM

## 2015-05-10 NOTE — Progress Notes (Signed)
ANTICOAGULATION CONSULT NOTE - Follow Up Consult  Pharmacy Consult for Heparin Indication: DVT and PE  Allergies  Allergen Reactions  . Tetanus Toxoids Anaphylaxis  . Daptomycin Other (See Comments)    myalgias  . Citrus Diarrhea    All citrus fruits  . Milk-Related Compounds Diarrhea    Milk and milk products  . Oxycodone   . Asa [Aspirin] Nausea Only and Rash  . Penicillins Rash  . Prednisone (Pak) [Prednisone] Other (See Comments)    Hyperactivity WITH ORAL, CAN TAKE SHOTS    Patient Measurements: Height: 5\' 8"  (172.7 cm) Weight: 272 lb 9.6 oz (123.651 kg) IBW/kg (Calculated) : 68.4 Heparin Dosing Weight: 97 kg  Vital Signs: Temp: 98.4 F (36.9 C) (05/16 0730) Temp Source: Oral (05/16 0730) BP: 132/71 mmHg (05/16 0730) Pulse Rate: 72 (05/16 0730)  Labs:  Recent Labs  05/07/15 1439  05/08/15 0340 05/09/15 0400 05/10/15 0342  HGB  --   < > 12.5* 12.9* 13.2  HCT  --   --  37.2* 38.3* 38.2*  PLT  --   --  99* 106* 136*  HEPARINUNFRC  --   < > 0.33 0.33 0.41  CREATININE  --   --  1.20 1.18  --   TROPONINI <0.03  --   --   --   --   < > = values in this interval not displayed.  Estimated Creatinine Clearance: 90.5 mL/min (by C-G formula based on Cr of 1.18).   Assessment: Ian [name] admitted with lower back and leg pain, erythema, swelling, cough, SOB - found to have PE and DVT.  PMH: HTN, HLD, GERD, pre-diabetes  Anticoagulation: IV heparin for bilateral PE w/ right heart strain (per CT 5/12) and LLE DVT (dx'ed at [facility]), transition to oral AC on Monday. CBC stable. Heparin level 0.41 in goal  Cardiovascular: HTN, HLD,VSS - lisinopril/hctz just dc'ed due to cough, on pravastatin  Endocrinology: Pre-DM.   Gastrointestinal / Nutrition: GERD on PPI, Vit D, Fibercon, Miralax, docusate  Neurology:   Nephrology: sCr stable 1.18. Lytes ok  Pulmonary: RA. Atrovent nasal spray  Hematology / Oncology: hgb stable, plts 94>99>104>136 (112 on admit)   PTA  Medication Issues  Best Practices: IV heparin   Goal of Therapy:  Heparin level 0.3-0.7 units/ml Monitor platelets by anticoagulation protocol: Yes   Plan:  - continue heparin at 1800 units/hr  - daily heparin level and CBC - f/u transition to PO AC    Jamont Mellin S. Alford Highland, PharmD, BCPS Clinical Staff Pharmacist Pager [phone]  Eilene Ghazi Stillinger 05/10/2015,9:22 AM

## 2015-05-10 NOTE — Discharge Summary (Addendum)
Physician Discharge Summary  SHILO PAUWELS ZOX:[id number] DOB: 1961/07/18 DOA: 05/06/2015  PCP: Leeanne Rio, PA-C  Admit date: 05/06/2015 Discharge date: 05/10/2015  Time spent: 35 minutes  Recommendations for Outpatient Follow-up:  1. Patient treated for PE/DVT discharged on Xarelto 2. Follow up on BMP and CBC on hospital follow up visit.   Discharge Diagnoses:  Principal Problem:   Pulmonary embolism Active Problems:   Hyperlipidemia   Testicular hypofunction   GERD (gastroesophageal reflux disease)   Essential hypertension, benign   Thrombocytopenia   Type 2 diabetes mellitus without complication   Obesity   Spinal stenosis of lumbar region   Spinal stenosis of lumbar region at multiple levels   Lower leg DVT (deep venous thromboembolism), acute   Discharge Condition: Stable  Diet recommendation: Heart Healthy  Filed Weights   05/08/15 0430 05/09/15 0417 05/10/15 0355  Weight: 128.187 kg (282 lb 9.6 oz) 124.739 kg (275 lb) 123.651 kg (272 lb 9.6 oz)    History of present illness:  Ian [name] is a 54 y.o. male with PMH of hyperlipidemia, diet-controled diabetes mellitus, GERD, history of C. difficile colitis, history of allergies, history of testicular dysfunction, who presents with cough, shortness of breath, and swelling over left calf.   Patient reports that he had laminectomy by Dr. Tonita Cong at the end of January. After that, he still has some level of back pain which limited his physical activity. He states that he has intermittently cough and mild SOB on exertion since March which he attributes to his sinus issues and allergies. No chest pain. He has tenderness over left calf area for 2 weeks, and did not pay attention. Today he noticed that his left leg and calf are swelling. He also reports having pain over right posterior thigh which he attributes to radiation from back pain .  Currently patient denies fever, chills, running nose, ear pain, headaches,  abdominal pain, diarrhea, constipation, dysuria, urgency, frequency, hematuria, skin rashes. No unilateral weakness, numbness or tingling sensations. No vision change or hearing loss.  In ED, patient was found to have acute DVT in left leg and bilateral saddle pulmonary embolism with right heart strain by CTA. Negative troponin, and INR 1.06, PTT 31, electrolytes okay, temperature 99.7. Patient is admitted to inpatient for further evaluation and treatment.  Hospital Course:  Patient is a pleasant 54 year old gentleman with a past medical history of dyslipidemia,.-controlled diabetes mellitus, undergoing laminectomy in January 2016, presented to the emergency department on 05/07/2015 with complaints of shortness of breath associated with left calf pain. He reports back surgery limiting activity. Workup in the emergency department included a CT scan with IV contrast that showed acute pulmonary embolism with CT evidence of right heart strain. He was started on IV heparin.   1. Pulmonary embolism. -Patient presented with complaints of shortness of breath, poor tolerance to physical exertion, with CT scan of lungs showing large volume pulmonary embolus bilaterally. CT scan showing evidence of right heart strain. -Patient was treated with intravenous heparin, remained hemodynamically during this hospitalization -On transthoracic echocardiogram right ventricle not well seen however report suggested there was no definite significant dilatation and that's RV function was intact. -On 05/10/2015 he was transitioned to oral anticoagulation with Xarelto -He had been on Clomiphene which carries the risk of thromboembolism. Also had reported minimal activity since recent back surgery which may have contributed as well.   2. Left lower extremity DVT. -Patient presented with complaints of left calf pain, erythema, swelling. Venous Doppler showing  acute deep venous thrombosis in the left mid thigh to the upper  calf. -Discharged on Xarelto  3. Chronic back pain. -Status post lumbar laminectomy  4. Diet-controlled diabetes mellitus.  -Blood sugars controlled  Procedures:  Transthoracic echocardiogram Impression: Right ventricle: The RV is not seen well. I do not see any definite significant dilitation. TAPSE valvue suggests that RV function is OK.   Discharge Exam: Filed Vitals:   05/10/15 1303  BP: 124/62  Pulse: 70  Temp: 97.5 F (36.4 C)  Resp: 18    General: Patient reporting feeling much better on day of discharge, was able to ambulate down the hallway and back  Cardiovascular: Regular rate and rhythm normal S1-S2 no murmurs or gallops  Respiratory: Normal respiratory, lungs are clear to auscultation bilaterally no wheezing rhonchi rales Abdomen: Soft nontender nondistended positive bowel sounds  Discharge Instructions   Discharge Instructions    Call MD for:  difficulty breathing, headache or visual disturbances    Complete by:  As directed      Call MD for:  extreme fatigue    Complete by:  As directed      Call MD for:  hives    Complete by:  As directed      Call MD for:  persistant dizziness or light-headedness    Complete by:  As directed      Call MD for:  persistant nausea and vomiting    Complete by:  As directed      Call MD for:  redness, tenderness, or signs of infection (pain, swelling, redness, odor or green/yellow discharge around incision site)    Complete by:  As directed      Call MD for:  severe uncontrolled pain    Complete by:  As directed      Call MD for:  temperature >100.4    Complete by:  As directed      Diet - low sodium heart healthy    Complete by:  As directed      Increase activity slowly    Complete by:  As directed           Current Discharge Medication List    START taking these medications   Details  Rivaroxaban (XARELTO) 15 MG TABS tablet Take 15 mg PO BID for 21 days followed by 20 mg PO q daily thereafter Qty  sufficient for 1 month Qty: 30 tablet, Refills: 3      CONTINUE these medications which have NOT CHANGED   Details  Blood Glucose Monitoring Suppl (RELION CONFIRM GLUCOSE MONITOR) W/DEVICE KIT USE AS DIRECTED TO TEST BLOOD GLUCOSE DAILY DX: 250.00 Qty: 1 kit, Refills: 0    Cholecalciferol (VITAMIN D-3) 5000 UNITS TABS Take 1 tablet by mouth daily.    docusate sodium (COLACE) 100 MG capsule Take 1 capsule (100 mg total) by mouth 2 (two) times daily as needed for mild constipation. Qty: 20 capsule, Refills: 1    EPIPEN 2-PAK 0.3 MG/0.3ML SOAJ injection Inject 0.3 mg into the skin as needed (allergies).     fenofibrate micronized (LOFIBRA) 134 MG capsule TAKE 1 CAPSULE (134 MG TOTAL) BY MOUTH DAILY BEFORE BREAKFAST. Qty: 30 capsule, Refills: 5    glucose blood test strip RELION BLOOD GLUCOSE TEST STRIP Use as instructed to test daily Dx: 250.00 Qty: 50 each, Refills: 11    HYDROcodone-acetaminophen (NORCO) 10-325 MG per tablet Take 1-2 tablets by mouth every 6 (six) hours as needed (Pain). Qty: 60 tablet, Refills: 0  ipratropium (ATROVENT) 0.06 % nasal spray Place 1 spray into both nostrils 2 (two) times daily.    LIVALO 4 MG TABS TAKE 1 TABLET (4 MG TOTAL) BY MOUTH DAILY. Qty: 30 tablet, Refills: 3    omega-3 acid ethyl esters (LOVAZA) 1 G capsule Take 2 capsules by mouth daily. Refills: 1    omeprazole (PRILOSEC) 20 MG capsule Take 1 capsule (20 mg total) by mouth daily. Qty: 90 capsule, Refills: 3   Associated Diagnoses: Gastroesophageal reflux disease without esophagitis      STOP taking these medications     ALLERGIST [name]      clomiPHENE (CLOMID) 50 MG tablet      HYDROcodone-homatropine (HYCODAN) 5-1.5 MG/5ML syrup      ibuprofen (ADVIL,MOTRIN) 200 MG tablet      potassium gluconate 595 MG TABS tablet      Pseudoeph-Bromphen-DM (ROBITUSSIN ALLERGY/COUGH PO)      Wheat Dextrin (EQ FIBER POWDER PO)        Allergies   Allergen Reactions  . Tetanus Toxoids Anaphylaxis  . Daptomycin Other (See Comments)    myalgias  . Citrus Diarrhea    All citrus fruits  . Milk-Related Compounds Diarrhea    Milk and milk products  . Oxycodone   . Asa [Aspirin] Nausea Only and Rash  . Penicillins Rash  . Prednisone (Pak) [Prednisone] Other (See Comments)    Hyperactivity WITH ORAL, CAN TAKE SHOTS   Follow-up Information    Follow up with Leeanne Rio, PA-C In 1 week.   Specialty:  Physician Assistant   Contact information:   [address] [place] [address] [phone]        The results of significant diagnostics from this hospitalization (including imaging, microbiology, ancillary and laboratory) are listed below for reference.    Significant Diagnostic Studies: Ct Angio Chest Pe W/cm &/or Wo Cm  05/06/2015   CLINICAL DATA:  Cough for several months. Dyspnea. Lower extremity deep venous thrombosis.  EXAM: CT ANGIOGRAPHY CHEST WITH CONTRAST  TECHNIQUE: Multidetector CT imaging of the chest was performed using the standard protocol during bolus administration of intravenous contrast. Multiplanar CT image reconstructions and MIPs were obtained to evaluate the vascular anatomy.  CONTRAST:  166m OMNIPAQUE IOHEXOL 350 MG/ML SOLN  COMPARISON:  Radiographs 01/14/2015.  FINDINGS: There is a large volume of pulmonary embolus bilaterally. This includes saddle emboli across the right and left pulmonary arteries although no emboli are evident in the main pulmonary artery. The pulmonary emboli continued into the upper lobes and lower lobes bilaterally, somewhat less in the left upper lobe. There is CT evidence of right heart strain, with RV to LV ratio of 1.09.  The lungs are clear. Airways are patent. There are no pleural effusions. No significant abnormality is evident in the upper abdomen.  Review of the MIP images confirms the above findings.  IMPRESSION: Positive for acute PE with CT evidence of  right heart strain (RV/LV Ratio = 1.09) consistent with at least submassive (intermediate risk) PE. The presence of right heart strain has been associated with an increased risk of morbidity and mortality. Please activate Code PE by paging [phone]  These results were called by telephone at the time of interpretation on 05/06/2015 at 8:21 pm to PA. NICOLE PISCIOTTA , who verbally acknowledged these results.   Electronically Signed   By: DAndreas NewportM.D.   On: 05/06/2015 20:21   [place]ous Img  Lower Unilateral Left  05/06/2015   ADDENDUM REPORT: 05/06/2015 19:49  CLINICAL DATA:  Swollen left calf, onset this evening. Recent pain in both posterior thighs.   Electronically Signed   By: Andreas Newport M.D.   On: 05/06/2015 19:49   05/06/2015   CLINICAL DATA:  Swallow left calf, onset this evening. Recent pain in both posterior thighs.  EXAM: Left LOWER EXTREMITY VENOUS DOPPLER ULTRASOUND  TECHNIQUE: Gray-scale sonography with graded compression, as well as color Doppler and duplex ultrasound were performed to evaluate the lower extremity deep venous systems from the level of the common femoral vein and including the common femoral, femoral, profunda femoral, popliteal and calf veins including the posterior tibial, peroneal and gastrocnemius veins when visible. The superficial great saphenous vein was also interrogated. Spectral Doppler was utilized to evaluate flow at rest and with distal augmentation maneuvers in the common femoral, femoral and popliteal veins.  COMPARISON:  None.  FINDINGS: Contralateral Common Femoral Vein: Respiratory phasicity is normal and symmetric with the symptomatic side. No evidence of thrombus. Normal compressibility.  Common Femoral Vein: No evidence of thrombus. Normal compressibility, respiratory phasicity and response to augmentation.  Saphenofemoral Junction: No evidence of thrombus. Normal compressibility and flow on color Doppler imaging.  Profunda Femoral Vein: No  evidence of thrombus. Normal compressibility and flow on color Doppler imaging.  Femoral Vein: Beginning in the midportion of the superficial femoral vein, there is distention of the deep venous system with echogenic thrombus, continuing caudally into the upper calf. This is noncompressible and exhibits typical absence of flow on Doppler imaging. This is typical of acute deep venous thrombosis.  Popliteal Vein: Distended with echogenic thrombus, typical of acute deep venous thrombosis.  Calf Veins: Positive for deep venous thrombosis.  Superficial Great Saphenous Vein: No evidence of thrombus. Normal compressibility and flow on color Doppler imaging.  Venous Reflux:  None.  Other Findings:  None.  IMPRESSION: There is acute deep venous thrombosis from the left midthigh to the upper calf. These results will be called to the ordering clinician or representative by the Radiologist Assistant, and communication documented in the PACS or zVision Dashboard.  Electronically Signed: By: Andreas Newport M.D. On: 05/06/2015 18:45    Microbiology: Recent Results (from the past 240 hour(s))  MRSA PCR Screening     Status: Abnormal   Collection Time: 05/07/15  1:19 AM  Result Value Ref Range Status   MRSA by PCR POSITIVE (A) NEGATIVE Final    Comment:        The GeneXpert MRSA Assay (FDA approved for NASAL specimens only), is one component of a comprehensive MRSA colonization surveillance program. It is not intended to diagnose MRSA infection nor to guide or monitor treatment for MRSA infections. RESULT CALLED TO, READ BACK BY AND VERIFIED WITH: RN,BRITTANY BUCKNER [id number] @[id number]  THANEY      Labs: Basic Metabolic Panel:  Recent Labs Lab 05/06/15 1900 05/07/15 0615 05/08/15 0340 05/09/15 0400  NA 138 138 136 140  K 3.9 3.8 4.2 4.3  CL 105 107 103 105  CO2 25 22 26 27   GLUCOSE 127* 144* 129* 132*  BUN 18 14 13 10   CREATININE 1.04 1.15 1.20 1.18  CALCIUM 8.9 8.3* 8.4* 8.6*   Liver Function  Tests:  Recent Labs Lab 05/07/15 0615  AST 23  ALT 18  ALKPHOS 37*  BILITOT 0.6  PROT 5.1*  ALBUMIN 2.8*   No results for input(s): LIPASE, AMYLASE in the last 168 hours. No results for input(s):  AMMONIA in the last 168 hours. CBC:  Recent Labs Lab 05/06/15 1900 05/07/15 0245 05/07/15 0615 05/08/15 0340 05/09/15 0400 05/10/15 0342  WBC 8.1 8.5 9.0 6.8 6.2 6.4  NEUTROABS 4.9  --   --   --   --   --   HGB 14.9 13.5 13.1 12.5* 12.9* 13.2  HCT 42.5 38.0* 37.9* 37.2* 38.3* 38.2*  MCV 93.0 91.8 92.7 93.0 92.5 91.8  PLT 112* 104* 94* 99* 106* 136*   Cardiac Enzymes:  Recent Labs Lab 05/06/15 1907 05/07/15 0601 05/07/15 1439  TROPONINI <0.03 <0.03 <0.03   BNP: BNP (last 3 results) No results for input(s): BNP in the last 8760 hours.  ProBNP (last 3 results) No results for input(s): PROBNP in the last 8760 hours.  CBG:  Recent Labs Lab 05/09/15 1146 05/09/15 1706 05/09/15 2114 05/10/15 0727 05/10/15 1142  GLUCAP 158* 147* 173* 107* 125*       Signed:  Nivaan Dicenzo  [facility] 05/10/2015, 3:21 PM

## 2015-05-11 ENCOUNTER — Telehealth: Payer: Self-pay | Admitting: *Deleted

## 2015-05-11 LAB — PROTHROMBIN GENE MUTATION

## 2015-05-11 NOTE — Telephone Encounter (Signed)
Spoke with patient for hospital follow-up and clarified medications.

## 2015-05-11 NOTE — Telephone Encounter (Signed)
Transition Care Management Follow-up Telephone Call  How have you been since you were released from the hospital? "I'm doing the best I can do, I can't move around very much"    Do you understand why you were in the hospital? YES    Do you understand the discharge instrcutions? YES  Items Reviewed:  Medications reviewed: YES  Allergies reviewed: YES   Dietary changes reviewed: YES   Referrals reviewed: YES   Functional Questionnaire:   Activities of Daily Living (ADLs):   He states they are independent in the following: eating, bathing, dressing States they require assistance with the following: driving, ambulating longer distances, cooking, cleaning    Any transportation issues/concerns?: NO- granddaughter will be with him for 2 weeks    Any patient concerns? NO    Confirmed importance and date/time of follow-up visits scheduled: YES- scheduled with Elyn Aquas, PA on 05/17/15   Confirmed with patient if condition begins to worsen call PCP or go to the ER.  Patient was given the Call-a-Nurse line [phone]: YES

## 2015-05-12 LAB — FACTOR 5 LEIDEN

## 2015-05-13 ENCOUNTER — Other Ambulatory Visit: Payer: Self-pay | Admitting: Physician Assistant

## 2015-05-13 NOTE — Telephone Encounter (Signed)
Spoke with the pt regarding refill request for the Prinzide.  Asked the pt to see if he was taking the Prinzide.  Pt stated that he was taking off the med because it was thought that he was having a reaction to the med.  Informed the pt that I will not be able to fill the med without Einar Pheasant approving it,and he is out of the office.  Pt stated that he has an appt with Einar Pheasant on Monday (05-17-15 @ 11:30am),and he will talk with him about the med then.  Pt stated for me to refuse the med.  Rx denied.//AB/CMA

## 2015-05-14 ENCOUNTER — Encounter: Payer: Self-pay | Admitting: Physician Assistant

## 2015-05-14 ENCOUNTER — Ambulatory Visit (INDEPENDENT_AMBULATORY_CARE_PROVIDER_SITE_OTHER): Payer: BLUE CROSS/BLUE SHIELD | Admitting: Physician Assistant

## 2015-05-14 VITALS — BP 140/82 | HR 80 | Temp 98.0°F | Resp 16 | Wt 263.5 lb

## 2015-05-14 DIAGNOSIS — I2699 Other pulmonary embolism without acute cor pulmonale: Secondary | ICD-10-CM

## 2015-05-14 MED ORDER — HYDROCODONE-ACETAMINOPHEN 10-325 MG PO TABS: 1.0000 | ORAL_TABLET | Freq: Four times a day (QID) | ORAL | Status: DC | PRN

## 2015-05-14 NOTE — Progress Notes (Signed)
Pre visit review using our clinic review tool, if applicable. No additional management support is needed unless otherwise documented below in the visit note. 

## 2015-05-14 NOTE — Patient Instructions (Signed)
Please take  1-2 tablets every 6 hours as needed for severe pain.  As pain improves, can switch to plain tylenol.  Avoid anti-inflammatories medication since you are on Xarelto as this significantly increases risk for bleeding.  Follow-up moving exercises as directed by Hospitalist.  You will be contacted by Hematology for an appointment.  Follow-up in 2 weeks.   If you notice any significant chest pain or SOB, call 911.

## 2015-05-15 ENCOUNTER — Other Ambulatory Visit: Payer: Self-pay | Admitting: Physician Assistant

## 2015-05-16 NOTE — Assessment & Plan Note (Signed)
Patient remains hemodynamically stable. Is taking Xarelto as directed.  Referral placed to Hematology giving findings on hypercoagulation panel.  Pain medications refilled with new dosing instructions.  Discussed supportive measures and alarm signs/symptoms.  Follow-up 2 weeks.

## 2015-05-16 NOTE — Progress Notes (Signed)
Patient presents to clinic today for hospital follow-up.  Patient presented to ER on 05/06/2015 with complaints of cough, SOB and chest discomfort associated with left leg swelling and calf pain.  Workup revealed DVT in LLE and Bilateral PE with saddle emboli noted. Was admitted to hospital and placed on IV heparin. Once stabilized was transitioned to PO Xarelto and discharged with close follow-up with PCP, Pulmonary and Hematology referral was recommended.  Since discharge patient endorses doing well overall.  Denies chest pain or SOB.  Endorses residual calf tenderness but it is improving.  Is taking Xarelto 15 mg BID.  Will transition to 20 mg after 21 days.  Denies new or worsening symptom.  Past Medical History  Diagnosis Date  . Internal bleeding hemorrhoids 2012    "might bleed once/month; only when I strain"  . Hyperlipidemia   . Clostridium difficile infection 2014  . Testicular hypofunction   . Biceps tendon rupture     bilateral  . GERD (gastroesophageal reflux disease)   . Colon polyps   . Allergy   . Arthritis   . Hypertension   . Diabetes     Borderline/Pre-diabetes  . Leg cramps   . Internal prolapsed hemorrhoids     Current Outpatient Prescriptions on File Prior to Visit  Medication Sig Dispense Refill  . Blood Glucose Monitoring Suppl (RELION CONFIRM GLUCOSE MONITOR) W/DEVICE KIT USE AS DIRECTED TO TEST BLOOD GLUCOSE DAILY DX: 250.00 1 kit 0  . Cholecalciferol (VITAMIN D-3) 5000 UNITS TABS Take 1 tablet by mouth daily.    Marland Kitchen EPIPEN 2-PAK 0.3 MG/0.3ML SOAJ injection Inject 0.3 mg into the skin as needed (allergies).     . fenofibrate micronized (LOFIBRA) 134 MG capsule TAKE 1 CAPSULE (134 MG TOTAL) BY MOUTH DAILY BEFORE BREAKFAST. 30 capsule 5  . glucose blood test strip RELION BLOOD GLUCOSE TEST STRIP Use as instructed to test daily Dx: 250.00 50 each 11  . ipratropium (ATROVENT) 0.06 % nasal spray Place 1 spray into both nostrils 2 (two) times daily.    Marland Kitchen LIVALO 4  MG TABS TAKE 1 TABLET (4 MG TOTAL) BY MOUTH DAILY. 30 tablet 3  . omega-3 acid ethyl esters (LOVAZA) 1 G capsule Take 2 capsules by mouth daily.  1  . omeprazole (PRILOSEC) 40 MG capsule Take 1 capsule by mouth daily.    . Rivaroxaban (XARELTO) 15 MG TABS tablet Take 15 mg PO BID for 21 days followed by 20 mg PO q daily thereafter Qty sufficient for 1 month 30 tablet 3   No current facility-administered medications on file prior to visit.    Allergies  Allergen Reactions  . Tetanus Toxoids Anaphylaxis  . Daptomycin Other (See Comments)    myalgias  . Citrus Diarrhea    All citrus fruits  . Milk-Related Compounds Diarrhea    Milk and milk products  . Oxycodone   . Asa [Aspirin] Nausea Only and Rash  . Penicillins Rash  . Prednisone (Pak) [Prednisone] Other (See Comments)    Hyperactivity WITH ORAL, CAN TAKE SHOTS    Family History  Problem Relation Age of Onset  . Heart disease Mother 75    Deceased  . Hyperlipidemia Mother   . Hypertension Mother   . Heart disease Father 55    Deceased  . CVA Father   . Hypertension Maternal Grandmother   . Hyperlipidemia Maternal Grandmother   . Cancer Maternal Grandmother     Colon Cancer  . Heart disease Maternal Grandfather   .  Hypertension Maternal Grandfather   . Hyperlipidemia Maternal Grandfather   . Cancer Maternal Grandfather     Lung Cancer  . Heart attack Maternal Grandfather   . Heart disease Paternal Grandfather     History   Social History  . Marital Status: Married    Spouse Name: N/A  . Number of Children: N/A  . Years of Education: N/A   Social History Main Topics  . Smoking status: Never Smoker   . Smokeless tobacco: Never Used  . Alcohol Use: No  . Drug Use: No  . Sexual Activity: Not Currently   Other Topics Concern  . None   Social History Narrative   Marital Status: Married Pamala Hurry)    Children:  Step Daughter Sharyn Lull)    Pets: None    Living Situation: Lives with Pamala Hurry    Occupation:  [employer]    Education: Associate's Degree in [employer]    Tobacco Use/Exposure:  None    Alcohol Use:  Rarely    Drug Use:  None   Diet:  Regular   Exercise: Limited     Hobbies:  Hunting, Fishing             Review of Systems - See HPI.  All other ROS are negative.  BP 140/82 mmHg  Pulse 80  Temp(Src) 98 F (36.7 C) (Oral)  Resp 16  Wt 263 lb 8 oz (119.523 kg)  SpO2 97%  Physical Exam  Constitutional: He is oriented to person, place, and time and well-developed, well-nourished, and in no distress.  HENT:  Head: Normocephalic and atraumatic.  Eyes: Conjunctivae are normal. Pupils are equal, round, and reactive to light.  Neck: Neck supple.  Cardiovascular: Normal rate, regular rhythm, normal heart sounds and intact distal pulses.   Mild swelling of Left calf noted on exam with palpable tenderness, consistent with patient's DVT.  RLE within normal limits.  Pulmonary/Chest: Effort normal and breath sounds normal. No respiratory distress. He has no wheezes. He has no rales. He exhibits no tenderness.  Neurological: He is alert and oriented to person, place, and time.  Skin: Skin is warm and dry. No rash noted.  Psychiatric: Affect normal.  Vitals reviewed.   Recent Results (from the past 2160 hour(s))  Comp Met (CMET)     Status: Abnormal   Collection Time: 03/08/15 11:03 AM  Result Value Ref Range   Sodium 134 (L) 135 - 145 mEq/L   Potassium 3.3 (L) 3.5 - 5.1 mEq/L   Chloride 100 96 - 112 mEq/L   CO2 28 19 - 32 mEq/L   Glucose, Bld 99 70 - 99 mg/dL   BUN 16 6 - 23 mg/dL   Creatinine, Ser 1.02 0.40 - 1.50 mg/dL   Total Bilirubin 0.7 0.2 - 1.2 mg/dL   Alkaline Phosphatase 36 (L) 39 - 117 U/L   AST 38 (H) 0 - 37 U/L   ALT 27 0 - 53 U/L   Total Protein 6.6 6.0 - 8.3 g/dL   Albumin 3.8 3.5 - 5.2 g/dL   Calcium 8.9 8.4 - 10.5 mg/dL   GFR 80.45 >60.00 mL/min  Lipid panel     Status: Abnormal   Collection Time: 03/08/15 11:03 AM  Result Value  Ref Range   Cholesterol 181 0 - 200 mg/dL    Comment: ATP III Classification       Desirable:  < 200 mg/dL  Borderline High:  200 - 239 mg/dL          High:  > = 240 mg/dL   Triglycerides (H) 0.0 - 149.0 mg/dL    514.0 Triglyceride is over 400; calculations on Lipids are invalid.    Comment: Normal:  <150 mg/dLBorderline High:  150 - 199 mg/dL   HDL 23.30 (L) >39.00 mg/dL   Total CHOL/HDL Ratio 8     Comment:                Men          Women1/2 Average Risk     3.4          3.3Average Risk          5.0          4.42X Average Risk          9.6          7.13X Average Risk          15.0          11.0                      LDL cholesterol, direct     Status: None   Collection Time: 03/08/15 11:03 AM  Result Value Ref Range   Direct LDL 43.0 mg/dL    Comment: Optimal:  <100 mg/dLNear or Above Optimal:  100-129 mg/dLBorderline High:  130-159 mg/dLHigh:  160-189 mg/dLVery High:  >190 mg/dL  POCT Influenza A/B     Status: Normal   Collection Time: 03/08/15 11:06 AM  Result Value Ref Range   Influenza A, POC Negative    Influenza B, POC Negative   CBC with Differential     Status: Abnormal   Collection Time: 05/06/15  7:00 PM  Result Value Ref Range   WBC 8.1 4.0 - 10.5 K/uL   RBC 4.57 4.22 - 5.81 MIL/uL   Hemoglobin 14.9 13.0 - 17.0 g/dL   HCT 42.5 39.0 - 52.0 %   MCV 93.0 78.0 - 100.0 fL   MCH 32.6 26.0 - 34.0 pg   MCHC 35.1 30.0 - 36.0 g/dL   RDW 13.1 11.5 - 15.5 %   Platelets 112 (L) 150 - 400 K/uL    Comment: SPECIMEN CHECKED FOR CLOTS PLATELET COUNT CONFIRMED BY SMEAR    Neutrophils Relative % 61 43 - 77 %   Neutro Abs 4.9 1.7 - 7.7 K/uL   Lymphocytes Relative 24 12 - 46 %   Lymphs Abs 1.9 0.7 - 4.0 K/uL   Monocytes Relative 9 3 - 12 %   Monocytes Absolute 0.8 0.1 - 1.0 K/uL   Eosinophils Relative 5 0 - 5 %   Eosinophils Absolute 0.4 0.0 - 0.7 K/uL   Basophils Relative 1 0 - 1 %   Basophils Absolute 0.1 0.0 - 0.1 K/uL  Basic metabolic panel     Status: Abnormal     Collection Time: 05/06/15  7:00 PM  Result Value Ref Range   Sodium 138 135 - 145 mmol/L   Potassium 3.9 3.5 - 5.1 mmol/L   Chloride 105 101 - 111 mmol/L   CO2 25 22 - 32 mmol/L   Glucose, Bld 127 (H) 65 - 99 mg/dL   BUN 18 6 - 20 mg/dL   Creatinine, Ser 1.04 0.61 - 1.24 mg/dL   Calcium 8.9 8.9 - 10.3 mg/dL   GFR calc non Af Amer >60 >60 mL/min   GFR calc Af  Amer >60 >60 mL/min    Comment: (NOTE) The eGFR has been calculated using the CKD EPI equation. This calculation has not been validated in all clinical situations. eGFR's persistently <60 mL/min signify possible Chronic Kidney Disease.    Anion gap 8 5 - 15  APTT     Status: None   Collection Time: 05/06/15  7:00 PM  Result Value Ref Range   aPTT 31 24 - 37 seconds  Protime-INR     Status: None   Collection Time: 05/06/15  7:00 PM  Result Value Ref Range   Prothrombin Time 13.8 11.6 - 15.2 seconds   INR 1.06 0.00 - 1.49  Troponin I     Status: None   Collection Time: 05/06/15  7:07 PM  Result Value Ref Range   Troponin I <0.03 <0.031 ng/mL    Comment:        NO INDICATION OF MYOCARDIAL INJURY.   CBG monitoring, ED     Status: Abnormal   Collection Time: 05/06/15 11:37 PM  Result Value Ref Range   Glucose-Capillary 121 (H) 65 - 99 mg/dL   Comment 1 Notify RN    Comment 2 Document in Chart   MRSA PCR Screening     Status: Abnormal   Collection Time: 05/07/15  1:19 AM  Result Value Ref Range   MRSA by PCR POSITIVE (A) NEGATIVE    Comment:        The GeneXpert MRSA Assay (FDA approved for NASAL specimens only), is one component of a comprehensive MRSA colonization surveillance program. It is not intended to diagnose MRSA infection nor to guide or monitor treatment for MRSA infections. RESULT CALLED TO, READ BACK BY AND VERIFIED WITH: RN,BRITTANY BUCKNER [id number] @[id number]  THANEY   Heparin level (unfractionated)     Status: Abnormal   Collection Time: 05/07/15  2:45 AM  Result Value Ref Range   Heparin  Unfractionated 0.24 (L) 0.30 - 0.70 IU/mL    Comment:        IF HEPARIN RESULTS ARE BELOW EXPECTED VALUES, AND PATIENT DOSAGE HAS BEEN CONFIRMED, SUGGEST FOLLOW UP TESTING OF ANTITHROMBIN III LEVELS.   CBC     Status: Abnormal   Collection Time: 05/07/15  2:45 AM  Result Value Ref Range   WBC 8.5 4.0 - 10.5 K/uL   RBC 4.14 (L) 4.22 - 5.81 MIL/uL   Hemoglobin 13.5 13.0 - 17.0 g/dL   HCT 38.0 (L) 39.0 - 52.0 %   MCV 91.8 78.0 - 100.0 fL   MCH 32.6 26.0 - 34.0 pg   MCHC 35.5 30.0 - 36.0 g/dL   RDW 13.4 11.5 - 15.5 %   Platelets 104 (L) 150 - 400 K/uL    Comment: PLATELET COUNT CONFIRMED BY SMEAR  Antithrombin III     Status: Abnormal   Collection Time: 05/07/15  2:47 AM  Result Value Ref Range   AntiThromb III Func 60 (L) 75 - 120 %  Protein C activity     Status: None   Collection Time: 05/07/15  2:47 AM  Result Value Ref Range   Protein C Activity 80 74 - 151 %    Comment: (NOTE) Performed At: [facility] [address] [place], [place] [id number] Lindon Romp MD TJ:[id number]   Protein C, total     Status: Abnormal   Collection Time: 05/07/15  2:47 AM  Result Value Ref Range   Protein C, Total 62 (L) 70 - 140 %    Comment: (NOTE) A deficiency  of protein C (PC), either congenital or acquired, increases the risk of thromboembolism. Congenital deficiencies of PC are very rare; acquired PC deficiency is much more common. PC levels can be transiently diminished after an acute thrombotic event. Oral anticoagulant therapy with warfarin will lower PC levels as well as vitamin K deficiency. Acquired deficiency can also occur in individuals with disseminated intravascular coagulation (DIC), sepsis, severe liver disease, nephrotic syndrome, and in inflammatory bowel disease. Levels may be spuriously decreased in individuals with Factor V Leiden. Drug therapy with L-asparaginse, fluorouracil, methotrexate, cyclophosphamide or tamoxifen can also reduce PC levels. It  has been suggested that repeat blood sampling and testing after ruling out acquired causes of deficiency should be performed before the patient is diagnosed with congenital Protein C deficiency. Performed At: [facility] [place], [place] [id number] Lindon Romp MD [id number]   Protein S activity     Status: None   Collection Time: 05/07/15  2:47 AM  Result Value Ref Range   Protein S Activity 89 60 - 145 %    Comment: (NOTE) Performed At: [facility] [place], [place] [id number] Lindon Romp MD [id number]   Protein S, total     Status: None   Collection Time: 05/07/15  2:47 AM  Result Value Ref Range   Protein S Ag, Total 92 58 - 150 %    Comment: (NOTE) Performed At: [facility] [place], [place] [id number] Lindon Romp MD AN:[id number]   Lupus anticoagulant panel     Status: Abnormal   Collection Time: 05/07/15  2:47 AM  Result Value Ref Range   PTT Lupus Anticoagulant 57.9 (H) 0.0 - 50.0 sec    Comment: (NOTE) Additional testing confirms the presence of heparin in the test sample. Results obtained after heparin neutralization.    DRVVT 44.9 0.0 - 55.1 sec   Lupus Anticoag Interp Comment:     Comment: (NOTE) No lupus anticoagulant was detected. Results are consistent with the presence of a specific inhibitor of one or more intrinsic pathway factors. These factors include fibrinogen, II, X, V, VIII, IX, XI, XII and the contact factors. It should be noted that mixing studies performed on samples with minimally extended aPTT results can be equivocal.  Normal plasma can overcome weak inhibitors, also resulting in a correction of the mixing study. Performed At: [facility] [place], [place] [id number] Lindon Romp MD [id number]   Beta-2-glycoprotein i abs, IgG/M/A     Status: None   Collection Time: 05/07/15  2:47 AM  Result Value Ref Range   Beta-2  Glyco I IgG <9 0 - 20 GPI IgG units    Comment: (NOTE) The reference interval reflects a 3SD or 99th percentile interval, which is thought to represent a potentially clinically significant result in accordance with the International Consensus Statement on the classification criteria for definitive antiphospholipid syndrome (APS). J Thromb Haem 2006;4:295-306.    Beta-2-Glycoprotein I IgM <9 0 - 32 GPI IgM units    Comment: (NOTE) The reference interval reflects a 3SD or 99th percentile interval, which is thought to represent a potentially clinically significant result in accordance with the International Consensus Statement on the classification criteria for definitive antiphospholipid syndrome (APS). J Thromb Haem 2006;4:295-306. Performed At: [facility] [place], [place] [id number] Lindon Romp MD [id number]    Beta-2-Glycoprotein I IgA <9 0 - 25 GPI IgA units    Comment: (NOTE)  The reference interval reflects a 3SD or 99th percentile interval, which is thought to represent a potentially clinically significant result in accordance with the International Consensus Statement on the classification criteria for definitive antiphospholipid syndrome (APS). J Thromb Haem 2006;4:295-306.   Factor 5 leiden     Status: None   Collection Time: 05/07/15  2:47 AM  Result Value Ref Range   Recommendations-F5LEID: Comment     Comment: (NOTE) Result:  Negative (no mutation found) Factor V Leiden is a specific mutation (R506Q) in the factor V gene that is associated with an increased risk of venous thrombosis. Factor V Leiden is more resistant to inactivation by activated protein C.  As a result, factor V persists in the circulation leading to a mild hyper- coagulable state.  The Leiden mutation accounts for 90% - 95% of APC resistance.  Factor V Leiden has been reported in patients with deep vein thrombosis, pulmonary embolus, central retinal vein occlusion,  cerebral sinus thrombosis and hepatic vein thrombosis. Other risk factors to be considered in the workup for venous thrombosis include the G20210A mutation in the factor II (prothrombin) gene, protein S and C deficiency, and antithrombin deficiencies. Anticardiolipin antibody and lupus anticoagulant analysis may be appropriate for certain patients, as well as homocysteine levels. Contact your local [facility] for information on how to order additi onal testing if desired.    Comment Comment     Comment: (NOTE) **Genetic counselors are available for health care providers to**  discuss results at [phone]). Methodology: DNA analysis of the Factor V gene was performed by allele-specific PCR. The diagnostic sensitivity and specificity is >99% for both. Molecular-based testing is highly accurate, but as in any laboratory test, diagnostic errors may occur. All test results must be combined with clinical information for the most accurate interpretation. References: Voelkerding K (1996).  Clin Lab Med [id number]. Allison Quarry, PhD Berenice Primas, PhD Jens Som, PhD Broadus John, PhD Alfredo Bach, PhD Norva Riffle, PhD Performed At: [facility] [address] [place], [place] [id number] Nechama Guard MD [id number]:[id number]   Prothrombin gene mutation     Status: None   Collection Time: 05/07/15  2:47 AM  Result Value Ref Range   Recommendations-PTGENE: Comment     Comment: (NOTE) NEGATIVE No mutation identified. Comment: A point mutation (G20210A) in the factor II (prothrombin) gene is the second most common cause of inherited thrombophilia. The incidence of this mutation in the [place] Caucasian population is about 2% and in the [place] American population it is approximately 0.5%. This mutation is rare in the [place] and Native American population. Being heterozygous for a prothrombin mutation increases the risk for developing venous thrombosis about 2  to 3 times above the general population risk. Being homozygous for the prothrombin gene mutation increases the relative risk for venous thrombosis further, although it is not yet known how much further the risk is increased. In women heterozygous for the prothrombin gene mutation, the use of estrogen containing oral contraceptives increases the relative risk of venous thrombosis about 16 times and the risk of developing cerebral thrombosis is also significantly increased. In pregnancy the pr othrombin gene mutation increases risk for venous thrombosis and may increase risk for stillbirth, placental abruption, pre-eclampsia and fetal growth restriction. If the patient possesses two or more congenital or acquired thrombophilic risk factors, the risk for thrombosis may rise to more than the sum of the risk ratios for the individual mutations. This assay detects only the prothrombin G20210A mutation and  does not measure genetic abnormalities elsewhere in the genome. Other thrombotic risk factors may be pursued through systematic clinical laboratory analysis. These factors include the R506Q (Leiden) mutation in the Factor V gene, plasma homocysteine levels, as well as testing for deficiencies of antithrombin III, protein C and protein S.    Additional Information Comment     Comment: (NOTE) Genetic Counselors are available for health care providers to discuss results at [phone]). Methodology: DNA analysis of the Factor II gene was performed by PCR amplification followed by restriction analysis. The diagnostic sensitivity is >99% for both. All the tests must be combined with clinical information for the most accurate interpretation. Molecular-based testing is highly accurate, but as in any laboratory test, diagnostic errors may occur. Poort SR, et al. Blood. 1996; 24:2353-6144. Varga EA. Circulation. 2004; 315:Q00-Q67. Mervin Hack, et [place]; 19:700-703. Allison Quarry, PhD Berenice Primas, PhD Jens Som, PhD Broadus John, PhD Alfredo Bach, PhD Norva Riffle, PhD Performed At: [facility] [address] [place], [place] [id number] Nechama Guard MD ZT:[id number]   Cardiolipin antibodies, IgG, IgM, IgA     Status: None   Collection Time: 05/07/15  2:47 AM  Result Value Ref Range   Anticardiolipin IgG <9 0 - 14 GPL U/mL    Comment: (NOTE)                          Negative:              <15                          Indeterminate:     15 - 20                          Low-Med Positive: >20 - 80                          High Positive:         >80    Anticardiolipin IgM <9 0 - 12 MPL U/mL    Comment: (NOTE)                          Negative:              <13                          Indeterminate:     13 - 20                          Low-Med Positive: >20 - 80                          High Positive:         >80    Anticardiolipin IgA <9 0 - 11 APL U/mL    Comment: (NOTE)                          Negative:              <12  Indeterminate:     12 - 20                          Low-Med Positive: >20 - 80                          High Positive:         >80 Performed At: [facility] [place], [place] [id number] Lindon Romp MD [id number]   PTT-LA Mix     Status: None   Collection Time: 05/07/15  2:47 AM  Result Value Ref Range   PTT-LA Mix 45.9 0.0 - 50.0 sec    Comment: (NOTE) Performed At: [facility] [place], [place] [id number] Lindon Romp MD [id number]   PTT-LA Incub Mix     Status: Abnormal   Collection Time: 05/07/15  2:47 AM  Result Value Ref Range   PTT-LA INCUB MIX 51.8 (H) 0.0 - 50.0 sec    Comment: (NOTE) Performed At: [facility] [place], [place] [id number] Lindon Romp MD [id number]   Hexagonal Phase Phospholipid     Status: None   Collection Time: 05/07/15   2:47 AM  Result Value Ref Range   Hexagonal Phase Phospholipid <1.0 0.0 - 8.0 sec    Comment: (NOTE) Performed At: [facility] [address] [place], [place] [id number] Lindon Romp MD [id number]   Homocysteine, serum     Status: None   Collection Time: 05/07/15  6:01 AM  Result Value Ref Range   Homocysteine 12.2 0.0 - 15.0 umol/L    Comment: (NOTE) Performed At: [facility] [place], [place] [id number] Lindon Romp MD [id number]   Troponin I     Status: None   Collection Time: 05/07/15  6:01 AM  Result Value Ref Range   Troponin I <0.03 <0.031 ng/mL    Comment:        NO INDICATION OF MYOCARDIAL INJURY.   Comprehensive metabolic panel     Status: Abnormal   Collection Time: 05/07/15  6:15 AM  Result Value Ref Range   Sodium 138 135 - 145 mmol/L   Potassium 3.8 3.5 - 5.1 mmol/L   Chloride 107 101 - 111 mmol/L   CO2 22 22 - 32 mmol/L   Glucose, Bld 144 (H) 65 - 99 mg/dL   BUN 14 6 - 20 mg/dL   Creatinine, Ser 1.15 0.61 - 1.24 mg/dL   Calcium 8.3 (L) 8.9 - 10.3 mg/dL   Total Protein 5.1 (L) 6.5 - 8.1 g/dL   Albumin 2.8 (L) 3.5 - 5.0 g/dL   AST 23 15 - 41 U/L   ALT 18 17 - 63 U/L   Alkaline Phosphatase 37 (L) 38 - 126 U/L   Total Bilirubin 0.6 0.3 - 1.2 mg/dL   GFR calc non Af Amer >60 >60 mL/min   GFR calc Af Amer >60 >60 mL/min    Comment: (NOTE) The eGFR has been calculated using the CKD EPI equation. This calculation has not been validated in all clinical situations. eGFR's persistently <60 mL/min signify possible Chronic Kidney Disease.    Anion gap 9 5 - 15  CBC     Status: Abnormal   Collection Time: 05/07/15  6:15 AM  Result Value Ref Range   WBC 9.0 4.0 - 10.5 K/uL   RBC 4.09 (L) 4.22 - 5.81 MIL/uL  Hemoglobin 13.1 13.0 - 17.0 g/dL   HCT 37.9 (L) 39.0 - 52.0 %   MCV 92.7 78.0 - 100.0 fL   MCH 32.0 26.0 - 34.0 pg   MCHC 34.6 30.0 - 36.0 g/dL   RDW 13.5 11.5 - 15.5 %   Platelets 94 (L) 150 - 400 K/uL      Comment: CONSISTENT WITH PREVIOUS RESULT  Heparin level (unfractionated)     Status: None   Collection Time: 05/07/15 10:52 AM  Result Value Ref Range   Heparin Unfractionated 0.48 0.30 - 0.70 IU/mL    Comment:        IF HEPARIN RESULTS ARE BELOW EXPECTED VALUES, AND PATIENT DOSAGE HAS BEEN CONFIRMED, SUGGEST FOLLOW UP TESTING OF ANTITHROMBIN III LEVELS.   Glucose, capillary     Status: Abnormal   Collection Time: 05/07/15 11:59 AM  Result Value Ref Range   Glucose-Capillary 124 (H) 65 - 99 mg/dL  Troponin I (q 6hr x 3)     Status: None   Collection Time: 05/07/15  2:39 PM  Result Value Ref Range   Troponin I <0.03 <0.031 ng/mL    Comment:        NO INDICATION OF MYOCARDIAL INJURY.   Heparin level (unfractionated)     Status: None   Collection Time: 05/07/15  4:24 PM  Result Value Ref Range   Heparin Unfractionated 0.36 0.30 - 0.70 IU/mL    Comment:        IF HEPARIN RESULTS ARE BELOW EXPECTED VALUES, AND PATIENT DOSAGE HAS BEEN CONFIRMED, SUGGEST FOLLOW UP TESTING OF ANTITHROMBIN III LEVELS.   Glucose, capillary     Status: Abnormal   Collection Time: 05/07/15  5:09 PM  Result Value Ref Range   Glucose-Capillary 195 (H) 65 - 99 mg/dL   Comment 1 Notify RN   Glucose, capillary     Status: Abnormal   Collection Time: 05/07/15  9:03 PM  Result Value Ref Range   Glucose-Capillary 146 (H) 65 - 99 mg/dL  Heparin level (unfractionated)     Status: None   Collection Time: 05/08/15  3:40 AM  Result Value Ref Range   Heparin Unfractionated 0.33 0.30 - 0.70 IU/mL    Comment:        IF HEPARIN RESULTS ARE BELOW EXPECTED VALUES, AND PATIENT DOSAGE HAS BEEN CONFIRMED, SUGGEST FOLLOW UP TESTING OF ANTITHROMBIN III LEVELS.   CBC     Status: Abnormal   Collection Time: 05/08/15  3:40 AM  Result Value Ref Range   WBC 6.8 4.0 - 10.5 K/uL   RBC 4.00 (L) 4.22 - 5.81 MIL/uL   Hemoglobin 12.5 (L) 13.0 - 17.0 g/dL   HCT 37.2 (L) 39.0 - 52.0 %   MCV 93.0 78.0 - 100.0 fL    MCH 31.3 26.0 - 34.0 pg   MCHC 33.6 30.0 - 36.0 g/dL   RDW 13.4 11.5 - 15.5 %   Platelets 99 (L) 150 - 400 K/uL    Comment: CONSISTENT WITH PREVIOUS RESULT  Basic metabolic panel     Status: Abnormal   Collection Time: 05/08/15  3:40 AM  Result Value Ref Range   Sodium 136 135 - 145 mmol/L   Potassium 4.2 3.5 - 5.1 mmol/L   Chloride 103 101 - 111 mmol/L   CO2 26 22 - 32 mmol/L   Glucose, Bld 129 (H) 65 - 99 mg/dL   BUN 13 6 - 20 mg/dL   Creatinine, Ser 1.20 0.61 - 1.24 mg/dL   Calcium  8.4 (L) 8.9 - 10.3 mg/dL   GFR calc non Af Amer >60 >60 mL/min   GFR calc Af Amer >60 >60 mL/min    Comment: (NOTE) The eGFR has been calculated using the CKD EPI equation. This calculation has not been validated in all clinical situations. eGFR's persistently <60 mL/min signify possible Chronic Kidney Disease.    Anion gap 7 5 - 15  Glucose, capillary     Status: Abnormal   Collection Time: 05/08/15  7:41 AM  Result Value Ref Range   Glucose-Capillary 136 (H) 65 - 99 mg/dL  Glucose, capillary     Status: Abnormal   Collection Time: 05/08/15 11:07 AM  Result Value Ref Range   Glucose-Capillary 146 (H) 65 - 99 mg/dL  Glucose, capillary     Status: Abnormal   Collection Time: 05/08/15  5:57 PM  Result Value Ref Range   Glucose-Capillary 205 (H) 65 - 99 mg/dL  Glucose, capillary     Status: Abnormal   Collection Time: 05/08/15  8:55 PM  Result Value Ref Range   Glucose-Capillary 145 (H) 65 - 99 mg/dL  Heparin level (unfractionated)     Status: None   Collection Time: 05/09/15  4:00 AM  Result Value Ref Range   Heparin Unfractionated 0.33 0.30 - 0.70 IU/mL    Comment:        IF HEPARIN RESULTS ARE BELOW EXPECTED VALUES, AND PATIENT DOSAGE HAS BEEN CONFIRMED, SUGGEST FOLLOW UP TESTING OF ANTITHROMBIN III LEVELS.   CBC     Status: Abnormal   Collection Time: 05/09/15  4:00 AM  Result Value Ref Range   WBC 6.2 4.0 - 10.5 K/uL   RBC 4.14 (L) 4.22 - 5.81 MIL/uL   Hemoglobin 12.9 (L) 13.0  - 17.0 g/dL   HCT 38.3 (L) 39.0 - 52.0 %   MCV 92.5 78.0 - 100.0 fL   MCH 31.2 26.0 - 34.0 pg   MCHC 33.7 30.0 - 36.0 g/dL   RDW 13.2 11.5 - 15.5 %   Platelets 106 (L) 150 - 400 K/uL    Comment: CONSISTENT WITH PREVIOUS RESULT  Basic metabolic panel     Status: Abnormal   Collection Time: 05/09/15  4:00 AM  Result Value Ref Range   Sodium 140 135 - 145 mmol/L   Potassium 4.3 3.5 - 5.1 mmol/L   Chloride 105 101 - 111 mmol/L   CO2 27 22 - 32 mmol/L   Glucose, Bld 132 (H) 65 - 99 mg/dL   BUN 10 6 - 20 mg/dL   Creatinine, Ser 1.18 0.61 - 1.24 mg/dL   Calcium 8.6 (L) 8.9 - 10.3 mg/dL   GFR calc non Af Amer >60 >60 mL/min   GFR calc Af Amer >60 >60 mL/min    Comment: (NOTE) The eGFR has been calculated using the CKD EPI equation. This calculation has not been validated in all clinical situations. eGFR's persistently <60 mL/min signify possible Chronic Kidney Disease.    Anion gap 8 5 - 15  Glucose, capillary     Status: Abnormal   Collection Time: 05/09/15  7:18 AM  Result Value Ref Range   Glucose-Capillary 133 (H) 65 - 99 mg/dL  Glucose, capillary     Status: Abnormal   Collection Time: 05/09/15 11:46 AM  Result Value Ref Range   Glucose-Capillary 158 (H) 65 - 99 mg/dL  Glucose, capillary     Status: Abnormal   Collection Time: 05/09/15  5:06 PM  Result Value Ref Range  Glucose-Capillary 147 (H) 65 - 99 mg/dL  Glucose, capillary     Status: Abnormal   Collection Time: 05/09/15  9:14 PM  Result Value Ref Range   Glucose-Capillary 173 (H) 65 - 99 mg/dL  Heparin level (unfractionated)     Status: None   Collection Time: 05/10/15  3:42 AM  Result Value Ref Range   Heparin Unfractionated 0.41 0.30 - 0.70 IU/mL    Comment:        IF HEPARIN RESULTS ARE BELOW EXPECTED VALUES, AND PATIENT DOSAGE HAS BEEN CONFIRMED, SUGGEST FOLLOW UP TESTING OF ANTITHROMBIN III LEVELS.   CBC     Status: Abnormal   Collection Time: 05/10/15  3:42 AM  Result Value Ref Range   WBC 6.4 4.0 -  10.5 K/uL   RBC 4.16 (L) 4.22 - 5.81 MIL/uL   Hemoglobin 13.2 13.0 - 17.0 g/dL   HCT 38.2 (L) 39.0 - 52.0 %   MCV 91.8 78.0 - 100.0 fL   MCH 31.7 26.0 - 34.0 pg   MCHC 34.6 30.0 - 36.0 g/dL   RDW 13.2 11.5 - 15.5 %   Platelets 136 (L) 150 - 400 K/uL  Glucose, capillary     Status: Abnormal   Collection Time: 05/10/15  7:27 AM  Result Value Ref Range   Glucose-Capillary 107 (H) 65 - 99 mg/dL   Comment 1 Notify RN   Glucose, capillary     Status: Abnormal   Collection Time: 05/10/15 11:42 AM  Result Value Ref Range   Glucose-Capillary 125 (H) 65 - 99 mg/dL   Comment 1 Notify RN   Glucose, capillary     Status: Abnormal   Collection Time: 05/10/15  4:16 PM  Result Value Ref Range   Glucose-Capillary 110 (H) 65 - 99 mg/dL   Comment 1 Notify RN     Assessment/Plan: Pulmonary embolism, bilateral Patient remains hemodynamically stable. Is taking Xarelto as directed.  Referral placed to Hematology giving findings on hypercoagulation panel.  Pain medications refilled with new dosing instructions.  Discussed supportive measures and alarm signs/symptoms.  Follow-up 2 weeks.

## 2015-05-17 ENCOUNTER — Ambulatory Visit: Payer: BLUE CROSS/BLUE SHIELD | Admitting: Physician Assistant

## 2015-05-18 ENCOUNTER — Telehealth: Payer: Self-pay | Admitting: Physician Assistant

## 2015-05-18 MED ORDER — HYDROCHLOROTHIAZIDE 25 MG PO TABS: 25.0000 mg | ORAL_TABLET | Freq: Every day | ORAL | Status: DC

## 2015-05-18 NOTE — Telephone Encounter (Signed)
Caller name:Caleel Relationship to patient:self Can be reached:[phone] Pharmacy:[facility] [address]  Reason for call:had a cough.  He went in hospital.  He went off the lisinopril.  Cough cleared up.  Started the lisinopril again on Tuesday and now he has a cough again.  He needs to use something other than the lisinopril

## 2015-05-18 NOTE — Telephone Encounter (Signed)
Rx hydrochlorothiazide 25 mg sent to his pharmacy.

## 2015-05-19 ENCOUNTER — Telehealth: Payer: Self-pay | Admitting: Physician Assistant

## 2015-05-19 ENCOUNTER — Other Ambulatory Visit: Payer: Self-pay | Admitting: Physician Assistant

## 2015-05-19 DIAGNOSIS — I2699 Other pulmonary embolism without acute cor pulmonale: Secondary | ICD-10-CM

## 2015-05-19 NOTE — Telephone Encounter (Signed)
Caller name: [place] Relation to pt: self Call back number: [phone] Pharmacy:  Reason for call:   Patient thought that Einar Pheasant was going to refer him to Dr. Marin Olp but states that he has not received a call as of yet

## 2015-05-19 NOTE — Telephone Encounter (Addendum)
Spoke with the pt and informed him that the referral has been entered and he should be hearing soon regarding the appt..  Pt agreed.//AB/CMA

## 2015-05-21 ENCOUNTER — Telehealth: Payer: Self-pay | Admitting: Hematology & Oncology

## 2015-05-21 NOTE — Telephone Encounter (Signed)
I spoke w NEW PATIENT today to remind them of their appointment with Dr. Ennever. Also, advised them to bring all medication bottles and insurance card information. ° °

## 2015-05-27 ENCOUNTER — Other Ambulatory Visit: Payer: Self-pay | Admitting: Physician Assistant

## 2015-05-28 ENCOUNTER — Ambulatory Visit (INDEPENDENT_AMBULATORY_CARE_PROVIDER_SITE_OTHER): Payer: BLUE CROSS/BLUE SHIELD | Admitting: Physician Assistant

## 2015-05-28 ENCOUNTER — Encounter: Payer: Self-pay | Admitting: Physician Assistant

## 2015-05-28 VITALS — BP 134/80 | HR 85 | Temp 98.1°F | Ht 68.0 in | Wt 268.6 lb

## 2015-05-28 DIAGNOSIS — I2699 Other pulmonary embolism without acute cor pulmonale: Secondary | ICD-10-CM

## 2015-05-28 NOTE — Progress Notes (Signed)
Pre visit review using our clinic review tool, if applicable. No additional management support is needed unless otherwise documented below in the visit note. 

## 2015-05-28 NOTE — Assessment & Plan Note (Signed)
Doing well overall.  Leg pain has resolved. Continue current regimen.  Will transition to 20 mg Xarelto QD next week after completing 21 days of 15 mg BID.  Follow-up with Hematology as directed.

## 2015-05-28 NOTE — Progress Notes (Signed)
Patient presents to clinic today for 2 week follow after hospital follow-up for acute DVT and bilateral saddle PE.  Endorses pain has mostly resolved although has both good and bad days.  Has upcoming appointent with Dr. Marin Olp (Hematology).  Has one week left of 15 mg BID Xarelto before transitioning to 20 mg QD.  Past Medical History  Diagnosis Date  . Internal bleeding hemorrhoids 2012    "might bleed once/month; only when I strain"  . Hyperlipidemia   . Clostridium difficile infection 2014  . Testicular hypofunction   . Biceps tendon rupture     bilateral  . GERD (gastroesophageal reflux disease)   . Colon polyps   . Allergy   . Arthritis   . Hypertension   . Diabetes     Borderline/Pre-diabetes  . Leg cramps   . Internal prolapsed hemorrhoids     Current Outpatient Prescriptions on File Prior to Visit  Medication Sig Dispense Refill  . Blood Glucose Monitoring Suppl (RELION CONFIRM GLUCOSE MONITOR) W/DEVICE KIT USE AS DIRECTED TO TEST BLOOD GLUCOSE DAILY DX: 250.00 1 kit 0  . Cholecalciferol (VITAMIN D-3) 5000 UNITS TABS Take 1 tablet by mouth daily.    Marland Kitchen [name] Take 25 mg by mouth daily.    Marland Kitchen docusate sodium (COLACE) 100 MG capsule Take 100 mg by mouth. 1 tablet in the morning and 2 tablets in the evening.    Marland Kitchen EPIPEN 2-PAK 0.3 MG/0.3ML SOAJ injection Inject 0.3 mg into the skin as needed (allergies).     . fenofibrate micronized (LOFIBRA) 134 MG capsule TAKE 1 CAPSULE (134 MG TOTAL) BY MOUTH DAILY BEFORE BREAKFAST. 90 capsule 1  . glucose blood test strip RELION BLOOD GLUCOSE TEST STRIP Use as instructed to test daily Dx: 250.00 50 each 11  . hydrochlorothiazide (HYDRODIURIL) 25 MG tablet Take 1 tablet (25 mg total) by mouth daily. 90 tablet 3  . HYDROcodone-acetaminophen (NORCO) 10-325 MG per tablet Take 1-2 tablets by mouth every 6 (six) hours as needed for moderate pain or severe pain (Pain). 240 tablet 0  . ipratropium (ATROVENT) 0.06 % nasal spray  Place 1 spray into both nostrils 2 (two) times daily.    Marland Kitchen LIVALO 4 MG TABS TAKE 1 TABLET (4 MG TOTAL) BY MOUTH DAILY. 30 tablet 5  . omega-3 acid ethyl esters (LOVAZA) 1 G capsule Take 2 capsules by mouth daily.  1  . omeprazole (PRILOSEC) 40 MG capsule Take 1 capsule by mouth daily.    . potassium gluconate 595 MG TABS tablet Take 595 mg by mouth daily.    . Rivaroxaban (XARELTO) 15 MG TABS tablet Take 15 mg PO BID for 21 days followed by 20 mg PO q daily thereafter Qty sufficient for 1 month 30 tablet 3   No current facility-administered medications on file prior to visit.    Allergies  Allergen Reactions  . Tetanus Toxoids Anaphylaxis  . Daptomycin Other (See Comments)    myalgias  . Citrus Diarrhea    All citrus fruits  . Milk-Related Compounds Diarrhea    Milk and milk products  . Oxycodone   . Asa [Aspirin] Nausea Only and Rash  . Penicillins Rash  . Prednisone (Pak) [Prednisone] Other (See Comments)    Hyperactivity WITH ORAL, CAN TAKE SHOTS    Family History  Problem Relation Age of Onset  . Heart disease Mother 21    Deceased  . Hyperlipidemia Mother   . Hypertension Mother   . Heart disease Father  67    Deceased  . CVA Father   . Hypertension Maternal Grandmother   . Hyperlipidemia Maternal Grandmother   . Cancer Maternal Grandmother     Colon Cancer  . Heart disease Maternal Grandfather   . Hypertension Maternal Grandfather   . Hyperlipidemia Maternal Grandfather   . Cancer Maternal Grandfather     Lung Cancer  . Heart attack Maternal Grandfather   . Heart disease Paternal Grandfather     History   Social History  . Marital Status: Married    Spouse Name: N/A  . Number of Children: N/A  . Years of Education: N/A   Social History Main Topics  . Smoking status: Never Smoker   . Smokeless tobacco: Never Used  . Alcohol Use: No  . Drug Use: No  . Sexual Activity: Not Currently   Other Topics Concern  . None   Social History Narrative    Marital Status: Married Pamala Hurry)    Children:  Step Daughter Sharyn Lull)    Pets: None    Living Situation: Lives with Pamala Hurry    Occupation: [employer] ([place]    Education: Associate's Degree in [employer]    Tobacco Use/Exposure:  None    Alcohol Use:  Rarely    Drug Use:  None   Diet:  Regular   Exercise: Limited     Hobbies:  Hunting, Fishing             Review of Systems - See HPI.  All other ROS are negative.  BP 134/80 mmHg  Pulse 85  Temp(Src) 98.1 F (36.7 C) (Oral)  Ht 5' 8"  (1.727 m)  Wt 268 lb 9.6 oz (121.836 kg)  BMI 40.85 kg/m2  SpO2 96%  Physical Exam  Constitutional: He is oriented to person, place, and time and well-developed, well-nourished, and in no distress.  HENT:  Head: Normocephalic and atraumatic.  Eyes: Conjunctivae are normal.  Cardiovascular: Normal rate, regular rhythm, normal heart sounds and intact distal pulses.   Pulmonary/Chest: Effort normal and breath sounds normal. No respiratory distress. He has no wheezes. He has no rales. He exhibits no tenderness.  Neurological: He is alert and oriented to person, place, and time.  Skin: Skin is warm and dry. No rash noted.  Psychiatric: Affect normal.  Vitals reviewed.   Recent Results (from the past 2160 hour(s))  Comp Met (CMET)     Status: Abnormal   Collection Time: 03/08/15 11:03 AM  Result Value Ref Range   Sodium 134 (L) 135 - 145 mEq/L   Potassium 3.3 (L) 3.5 - 5.1 mEq/L   Chloride 100 96 - 112 mEq/L   CO2 28 19 - 32 mEq/L   Glucose, Bld 99 70 - 99 mg/dL   BUN 16 6 - 23 mg/dL   Creatinine, Ser 1.02 0.40 - 1.50 mg/dL   Total Bilirubin 0.7 0.2 - 1.2 mg/dL   Alkaline Phosphatase 36 (L) 39 - 117 U/L   AST 38 (H) 0 - 37 U/L   ALT 27 0 - 53 U/L   Total Protein 6.6 6.0 - 8.3 g/dL   Albumin 3.8 3.5 - 5.2 g/dL   Calcium 8.9 8.4 - 10.5 mg/dL   GFR 80.45 >60.00 mL/min  Lipid panel     Status: Abnormal   Collection Time: 03/08/15 11:03 AM  Result Value Ref Range    Cholesterol 181 0 - 200 mg/dL    Comment: ATP III Classification       Desirable:  <  200 mg/dL               Borderline High:  200 - 239 mg/dL          High:  > = 240 mg/dL   Triglycerides (H) 0.0 - 149.0 mg/dL    514.0 Triglyceride is over 400; calculations on Lipids are invalid.    Comment: Normal:  <150 mg/dLBorderline High:  150 - 199 mg/dL   HDL 23.30 (L) >39.00 mg/dL   Total CHOL/HDL Ratio 8     Comment:                Men          Women1/2 Average Risk     3.4          3.3Average Risk          5.0          4.42X Average Risk          9.6          7.13X Average Risk          15.0          11.0                      LDL cholesterol, direct     Status: None   Collection Time: 03/08/15 11:03 AM  Result Value Ref Range   Direct LDL 43.0 mg/dL    Comment: Optimal:  <100 mg/dLNear or Above Optimal:  100-129 mg/dLBorderline High:  130-159 mg/dLHigh:  160-189 mg/dLVery High:  >190 mg/dL  POCT Influenza A/B     Status: Normal   Collection Time: 03/08/15 11:06 AM  Result Value Ref Range   Influenza A, POC Negative    Influenza B, POC Negative   CBC with Differential     Status: Abnormal   Collection Time: 05/06/15  7:00 PM  Result Value Ref Range   WBC 8.1 4.0 - 10.5 K/uL   RBC 4.57 4.22 - 5.81 MIL/uL   Hemoglobin 14.9 13.0 - 17.0 g/dL   HCT 42.5 39.0 - 52.0 %   MCV 93.0 78.0 - 100.0 fL   MCH 32.6 26.0 - 34.0 pg   MCHC 35.1 30.0 - 36.0 g/dL   RDW 13.1 11.5 - 15.5 %   Platelets 112 (L) 150 - 400 K/uL    Comment: SPECIMEN CHECKED FOR CLOTS PLATELET COUNT CONFIRMED BY SMEAR    Neutrophils Relative % 61 43 - 77 %   Neutro Abs 4.9 1.7 - 7.7 K/uL   Lymphocytes Relative 24 12 - 46 %   Lymphs Abs 1.9 0.7 - 4.0 K/uL   Monocytes Relative 9 3 - 12 %   Monocytes Absolute 0.8 0.1 - 1.0 K/uL   Eosinophils Relative 5 0 - 5 %   Eosinophils Absolute 0.4 0.0 - 0.7 K/uL   Basophils Relative 1 0 - 1 %   Basophils Absolute 0.1 0.0 - 0.1 K/uL  Basic metabolic panel     Status: Abnormal   Collection  Time: 05/06/15  7:00 PM  Result Value Ref Range   Sodium 138 135 - 145 mmol/L   Potassium 3.9 3.5 - 5.1 mmol/L   Chloride 105 101 - 111 mmol/L   CO2 25 22 - 32 mmol/L   Glucose, Bld 127 (H) 65 - 99 mg/dL   BUN 18 6 - 20 mg/dL   Creatinine, Ser 1.04 0.61 - 1.24 mg/dL   Calcium 8.9 8.9 - 10.3 mg/dL  GFR calc non Af Amer >60 >60 mL/min   GFR calc Af Amer >60 >60 mL/min    Comment: (NOTE) The eGFR has been calculated using the CKD EPI equation. This calculation has not been validated in all clinical situations. eGFR's persistently <60 mL/min signify possible Chronic Kidney Disease.    Anion gap 8 5 - 15  APTT     Status: None   Collection Time: 05/06/15  7:00 PM  Result Value Ref Range   aPTT 31 24 - 37 seconds  Protime-INR     Status: None   Collection Time: 05/06/15  7:00 PM  Result Value Ref Range   Prothrombin Time 13.8 11.6 - 15.2 seconds   INR 1.06 0.00 - 1.49  Troponin I     Status: None   Collection Time: 05/06/15  7:07 PM  Result Value Ref Range   Troponin I <0.03 <0.031 ng/mL    Comment:        NO INDICATION OF MYOCARDIAL INJURY.   CBG monitoring, ED     Status: Abnormal   Collection Time: 05/06/15 11:37 PM  Result Value Ref Range   Glucose-Capillary 121 (H) 65 - 99 mg/dL   Comment 1 Notify RN    Comment 2 Document in Chart   MRSA PCR Screening     Status: Abnormal   Collection Time: 05/07/15  1:19 AM  Result Value Ref Range   MRSA by PCR POSITIVE (A) NEGATIVE    Comment:        The GeneXpert MRSA Assay (FDA approved for NASAL specimens only), is one component of a comprehensive MRSA colonization surveillance program. It is not intended to diagnose MRSA infection nor to guide or monitor treatment for MRSA infections. RESULT CALLED TO, READ BACK BY AND VERIFIED WITH: RN,BRITTANY BUCKNER [id number] @[id number]  THANEY   Heparin level (unfractionated)     Status: Abnormal   Collection Time: 05/07/15  2:45 AM  Result Value Ref Range   Heparin Unfractionated 0.24  (L) 0.30 - 0.70 IU/mL    Comment:        IF HEPARIN RESULTS ARE BELOW EXPECTED VALUES, AND PATIENT DOSAGE HAS BEEN CONFIRMED, SUGGEST FOLLOW UP TESTING OF ANTITHROMBIN III LEVELS.   CBC     Status: Abnormal   Collection Time: 05/07/15  2:45 AM  Result Value Ref Range   WBC 8.5 4.0 - 10.5 K/uL   RBC 4.14 (L) 4.22 - 5.81 MIL/uL   Hemoglobin 13.5 13.0 - 17.0 g/dL   HCT 38.0 (L) 39.0 - 52.0 %   MCV 91.8 78.0 - 100.0 fL   MCH 32.6 26.0 - 34.0 pg   MCHC 35.5 30.0 - 36.0 g/dL   RDW 13.4 11.5 - 15.5 %   Platelets 104 (L) 150 - 400 K/uL    Comment: PLATELET COUNT CONFIRMED BY SMEAR  Antithrombin III     Status: Abnormal   Collection Time: 05/07/15  2:47 AM  Result Value Ref Range   AntiThromb III Func 60 (L) 75 - 120 %  Protein C activity     Status: None   Collection Time: 05/07/15  2:47 AM  Result Value Ref Range   Protein C Activity 80 74 - 151 %    Comment: (NOTE) Performed At: [facility] [address] [place], [place] [id number] Lindon Romp MD [id number]   Protein C, total     Status: Abnormal   Collection Time: 05/07/15  2:47 AM  Result Value Ref Range   Protein C, Total  62 (L) 70 - 140 %    Comment: (NOTE) A deficiency of protein C (PC), either congenital or acquired, increases the risk of thromboembolism. Congenital deficiencies of PC are very rare; acquired PC deficiency is much more common. PC levels can be transiently diminished after an acute thrombotic event. Oral anticoagulant therapy with warfarin will lower PC levels as well as vitamin K deficiency. Acquired deficiency can also occur in individuals with disseminated intravascular coagulation (DIC), sepsis, severe liver disease, nephrotic syndrome, and in inflammatory bowel disease. Levels may be spuriously decreased in individuals with Factor V Leiden. Drug therapy with L-asparaginse, fluorouracil, methotrexate, cyclophosphamide or tamoxifen can also reduce PC levels. It has been suggested  that repeat blood sampling and testing after ruling out acquired causes of deficiency should be performed before the patient is diagnosed with congenital Protein C deficiency. Performed At: [facility] [place], [place] [id number] Lindon Romp MD QA:[id number]   Protein S activity     Status: None   Collection Time: 05/07/15  2:47 AM  Result Value Ref Range   Protein S Activity 89 60 - 145 %    Comment: (NOTE) Performed At: [facility] [place], [place] [id number] Lindon Romp MD [id number]   Protein S, total     Status: None   Collection Time: 05/07/15  2:47 AM  Result Value Ref Range   Protein S Ag, Total 92 58 - 150 %    Comment: (NOTE) Performed At: [facility] [place], [place] [id number] Lindon Romp MD VZ:[id number]   Lupus anticoagulant panel     Status: Abnormal   Collection Time: 05/07/15  2:47 AM  Result Value Ref Range   PTT Lupus Anticoagulant 57.9 (H) 0.0 - 50.0 sec    Comment: (NOTE) Additional testing confirms the presence of heparin in the test sample. Results obtained after heparin neutralization.    DRVVT 44.9 0.0 - 55.1 sec   Lupus Anticoag Interp Comment:     Comment: (NOTE) No lupus anticoagulant was detected. Results are consistent with the presence of a specific inhibitor of one or more intrinsic pathway factors. These factors include fibrinogen, II, X, V, VIII, IX, XI, XII and the contact factors. It should be noted that mixing studies performed on samples with minimally extended aPTT results can be equivocal.  Normal plasma can overcome weak inhibitors, also resulting in a correction of the mixing study. Performed At: [facility] [place], [place] [id number] Lindon Romp MD [id number]   Beta-2-glycoprotein i abs, IgG/M/A     Status: None   Collection Time: 05/07/15  2:47 AM  Result Value Ref Range   Beta-2 Glyco I IgG <9 0 -  20 GPI IgG units    Comment: (NOTE) The reference interval reflects a 3SD or 99th percentile interval, which is thought to represent a potentially clinically significant result in accordance with the International Consensus Statement on the classification criteria for definitive antiphospholipid syndrome (APS). J Thromb Haem 2006;4:295-306.    Beta-2-Glycoprotein I IgM <9 0 - 32 GPI IgM units    Comment: (NOTE) The reference interval reflects a 3SD or 99th percentile interval, which is thought to represent a potentially clinically significant result in accordance with the International Consensus Statement on the classification criteria for definitive antiphospholipid syndrome (APS). J Thromb Haem 2006;4:295-306. Performed At: [facility] [place], [place] [id number] Lindon Romp MD [id number]    Beta-2-Glycoprotein I  IgA <9 0 - 25 GPI IgA units    Comment: (NOTE) The reference interval reflects a 3SD or 99th percentile interval, which is thought to represent a potentially clinically significant result in accordance with the International Consensus Statement on the classification criteria for definitive antiphospholipid syndrome (APS). J Thromb Haem 2006;4:295-306.   Factor 5 leiden     Status: None   Collection Time: 05/07/15  2:47 AM  Result Value Ref Range   Recommendations-F5LEID: Comment     Comment: (NOTE) Result:  Negative (no mutation found) Factor V Leiden is a specific mutation (R506Q) in the factor V gene that is associated with an increased risk of venous thrombosis. Factor V Leiden is more resistant to inactivation by activated protein C.  As a result, factor V persists in the circulation leading to a mild hyper- coagulable state.  The Leiden mutation accounts for 90% - 95% of APC resistance.  Factor V Leiden has been reported in patients with deep vein thrombosis, pulmonary embolus, central retinal vein occlusion, cerebral sinus  thrombosis and hepatic vein thrombosis. Other risk factors to be considered in the workup for venous thrombosis include the G20210A mutation in the factor II (prothrombin) gene, protein S and C deficiency, and antithrombin deficiencies. Anticardiolipin antibody and lupus anticoagulant analysis may be appropriate for certain patients, as well as homocysteine levels. Contact your local [facility] for information on how to order additi onal testing if desired.    Comment Comment     Comment: (NOTE) **Genetic counselors are available for health care providers to**  discuss results at [phone]). Methodology: DNA analysis of the Factor V gene was performed by allele-specific PCR. The diagnostic sensitivity and specificity is >99% for both. Molecular-based testing is highly accurate, but as in any laboratory test, diagnostic errors may occur. All test results must be combined with clinical information for the most accurate interpretation. References: Voelkerding K (1996).  Clin Lab Med [phone]. Allison Quarry, PhD Berenice Primas, PhD Jens Som, PhD Broadus John, PhD Alfredo Bach, PhD Norva Riffle, PhD Performed At: [facility] [address] [place], [place] [id number] Nechama Guard MD PZ:[id number]   Prothrombin gene mutation     Status: None   Collection Time: 05/07/15  2:47 AM  Result Value Ref Range   Recommendations-PTGENE: Comment     Comment: (NOTE) NEGATIVE No mutation identified. Comment: A point mutation (G20210A) in the factor II (prothrombin) gene is the second most common cause of inherited thrombophilia. The incidence of this mutation in the [place] Caucasian population is about 2% and in the [place] American population it is approximately 0.5%. This mutation is rare in the [place] and Native American population. Being heterozygous for a prothrombin mutation increases the risk for developing venous thrombosis about 2 to 3 times  above the general population risk. Being homozygous for the prothrombin gene mutation increases the relative risk for venous thrombosis further, although it is not yet known how much further the risk is increased. In women heterozygous for the prothrombin gene mutation, the use of estrogen containing oral contraceptives increases the relative risk of venous thrombosis about 16 times and the risk of developing cerebral thrombosis is also significantly increased. In pregnancy the pr othrombin gene mutation increases risk for venous thrombosis and may increase risk for stillbirth, placental abruption, pre-eclampsia and fetal growth restriction. If the patient possesses two or more congenital or acquired thrombophilic risk factors, the risk for thrombosis may rise to more than the sum of the risk ratios  for the individual mutations. This assay detects only the prothrombin G20210A mutation and does not measure genetic abnormalities elsewhere in the genome. Other thrombotic risk factors may be pursued through systematic clinical laboratory analysis. These factors include the R506Q (Leiden) mutation in the Factor V gene, plasma homocysteine levels, as well as testing for deficiencies of antithrombin III, protein C and protein S.    Additional Information Comment     Comment: (NOTE) Genetic Counselors are available for health care providers to discuss results at [phone]). Methodology: DNA analysis of the Factor II gene was performed by PCR amplification followed by restriction analysis. The diagnostic sensitivity is >99% for both. All the tests must be combined with clinical information for the most accurate interpretation. Molecular-based testing is highly accurate, but as in any laboratory test, diagnostic errors may occur. Poort SR, et al. Blood. 1996; 41:9622-2979. Varga EA. Circulation. 2004; 892:J19-E17. Mervin Hack, et [place]; 19:700-703. Allison Quarry, PhD Berenice Primas, PhD Jens Som, PhD Broadus John, PhD Alfredo Bach, PhD Norva Riffle, PhD Performed At: [facility] [address] [place], [place] [id number] Nechama Guard MD [id number]   Cardiolipin antibodies, IgG, IgM, IgA     Status: None   Collection Time: 05/07/15  2:47 AM  Result Value Ref Range   Anticardiolipin IgG <9 0 - 14 GPL U/mL    Comment: (NOTE)                          Negative:              <15                          Indeterminate:     15 - 20                          Low-Med Positive: >20 - 80                          High Positive:         >80    Anticardiolipin IgM <9 0 - 12 MPL U/mL    Comment: (NOTE)                          Negative:              <13                          Indeterminate:     13 - 20                          Low-Med Positive: >20 - 80                          High Positive:         >80    Anticardiolipin IgA <9 0 - 11 APL U/mL    Comment: (NOTE)                          Negative:              <12  Indeterminate:     12 - 20                          Low-Med Positive: >20 - 80                          High Positive:         >80 Performed At: [facility] [place], [place] [id number] Lindon Romp MD [id number]   PTT-LA Mix     Status: None   Collection Time: 05/07/15  2:47 AM  Result Value Ref Range   PTT-LA Mix 45.9 0.0 - 50.0 sec    Comment: (NOTE) Performed At: [facility] [place], [place] [id number] Lindon Romp MD [id number]   PTT-LA Incub Mix     Status: Abnormal   Collection Time: 05/07/15  2:47 AM  Result Value Ref Range   PTT-LA INCUB MIX 51.8 (H) 0.0 - 50.0 sec    Comment: (NOTE) Performed At: [facility] [place], [place] [id number] Lindon Romp MD EZ:[id number]   Hexagonal Phase Phospholipid     Status: None   Collection Time: 05/07/15   2:47 AM  Result Value Ref Range   Hexagonal Phase Phospholipid <1.0 0.0 - 8.0 sec    Comment: (NOTE) Performed At: [facility] [address] [place], [place] [id number] Lindon Romp MD [id number]   Homocysteine, serum     Status: None   Collection Time: 05/07/15  6:01 AM  Result Value Ref Range   Homocysteine 12.2 0.0 - 15.0 umol/L    Comment: (NOTE) Performed At: [facility] [place], [place] [id number] Lindon Romp MD [id number]   Troponin I     Status: None   Collection Time: 05/07/15  6:01 AM  Result Value Ref Range   Troponin I <0.03 <0.031 ng/mL    Comment:        NO INDICATION OF MYOCARDIAL INJURY.   Comprehensive metabolic panel     Status: Abnormal   Collection Time: 05/07/15  6:15 AM  Result Value Ref Range   Sodium 138 135 - 145 mmol/L   Potassium 3.8 3.5 - 5.1 mmol/L   Chloride 107 101 - 111 mmol/L   CO2 22 22 - 32 mmol/L   Glucose, Bld 144 (H) 65 - 99 mg/dL   BUN 14 6 - 20 mg/dL   Creatinine, Ser 1.15 0.61 - 1.24 mg/dL   Calcium 8.3 (L) 8.9 - 10.3 mg/dL   Total Protein 5.1 (L) 6.5 - 8.1 g/dL   Albumin 2.8 (L) 3.5 - 5.0 g/dL   AST 23 15 - 41 U/L   ALT 18 17 - 63 U/L   Alkaline Phosphatase 37 (L) 38 - 126 U/L   Total Bilirubin 0.6 0.3 - 1.2 mg/dL   GFR calc non Af Amer >60 >60 mL/min   GFR calc Af Amer >60 >60 mL/min    Comment: (NOTE) The eGFR has been calculated using the CKD EPI equation. This calculation has not been validated in all clinical situations. eGFR's persistently <60 mL/min signify possible Chronic Kidney Disease.    Anion gap 9 5 - 15  CBC     Status: Abnormal   Collection Time: 05/07/15  6:15 AM  Result Value Ref Range   WBC 9.0 4.0 - 10.5 K/uL   RBC 4.09 (L) 4.22 - 5.81 MIL/uL  Hemoglobin 13.1 13.0 - 17.0 g/dL   HCT 37.9 (L) 39.0 - 52.0 %   MCV 92.7 78.0 - 100.0 fL   MCH 32.0 26.0 - 34.0 pg   MCHC 34.6 30.0 - 36.0 g/dL   RDW 13.5 11.5 - 15.5 %   Platelets 94 (L) 150 - 400 K/uL     Comment: CONSISTENT WITH PREVIOUS RESULT  Heparin level (unfractionated)     Status: None   Collection Time: 05/07/15 10:52 AM  Result Value Ref Range   Heparin Unfractionated 0.48 0.30 - 0.70 IU/mL    Comment:        IF HEPARIN RESULTS ARE BELOW EXPECTED VALUES, AND PATIENT DOSAGE HAS BEEN CONFIRMED, SUGGEST FOLLOW UP TESTING OF ANTITHROMBIN III LEVELS.   Glucose, capillary     Status: Abnormal   Collection Time: 05/07/15 11:59 AM  Result Value Ref Range   Glucose-Capillary 124 (H) 65 - 99 mg/dL  Troponin I (q 6hr x 3)     Status: None   Collection Time: 05/07/15  2:39 PM  Result Value Ref Range   Troponin I <0.03 <0.031 ng/mL    Comment:        NO INDICATION OF MYOCARDIAL INJURY.   Heparin level (unfractionated)     Status: None   Collection Time: 05/07/15  4:24 PM  Result Value Ref Range   Heparin Unfractionated 0.36 0.30 - 0.70 IU/mL    Comment:        IF HEPARIN RESULTS ARE BELOW EXPECTED VALUES, AND PATIENT DOSAGE HAS BEEN CONFIRMED, SUGGEST FOLLOW UP TESTING OF ANTITHROMBIN III LEVELS.   Glucose, capillary     Status: Abnormal   Collection Time: 05/07/15  5:09 PM  Result Value Ref Range   Glucose-Capillary 195 (H) 65 - 99 mg/dL   Comment 1 Notify RN   Glucose, capillary     Status: Abnormal   Collection Time: 05/07/15  9:03 PM  Result Value Ref Range   Glucose-Capillary 146 (H) 65 - 99 mg/dL  Heparin level (unfractionated)     Status: None   Collection Time: 05/08/15  3:40 AM  Result Value Ref Range   Heparin Unfractionated 0.33 0.30 - 0.70 IU/mL    Comment:        IF HEPARIN RESULTS ARE BELOW EXPECTED VALUES, AND PATIENT DOSAGE HAS BEEN CONFIRMED, SUGGEST FOLLOW UP TESTING OF ANTITHROMBIN III LEVELS.   CBC     Status: Abnormal   Collection Time: 05/08/15  3:40 AM  Result Value Ref Range   WBC 6.8 4.0 - 10.5 K/uL   RBC 4.00 (L) 4.22 - 5.81 MIL/uL   Hemoglobin 12.5 (L) 13.0 - 17.0 g/dL   HCT 37.2 (L) 39.0 - 52.0 %   MCV 93.0 78.0 - 100.0 fL    MCH 31.3 26.0 - 34.0 pg   MCHC 33.6 30.0 - 36.0 g/dL   RDW 13.4 11.5 - 15.5 %   Platelets 99 (L) 150 - 400 K/uL    Comment: CONSISTENT WITH PREVIOUS RESULT  Basic metabolic panel     Status: Abnormal   Collection Time: 05/08/15  3:40 AM  Result Value Ref Range   Sodium 136 135 - 145 mmol/L   Potassium 4.2 3.5 - 5.1 mmol/L   Chloride 103 101 - 111 mmol/L   CO2 26 22 - 32 mmol/L   Glucose, Bld 129 (H) 65 - 99 mg/dL   BUN 13 6 - 20 mg/dL   Creatinine, Ser 1.20 0.61 - 1.24 mg/dL   Calcium 8.4 (  L) 8.9 - 10.3 mg/dL   GFR calc non Af Amer >60 >60 mL/min   GFR calc Af Amer >60 >60 mL/min    Comment: (NOTE) The eGFR has been calculated using the CKD EPI equation. This calculation has not been validated in all clinical situations. eGFR's persistently <60 mL/min signify possible Chronic Kidney Disease.    Anion gap 7 5 - 15  Glucose, capillary     Status: Abnormal   Collection Time: 05/08/15  7:41 AM  Result Value Ref Range   Glucose-Capillary 136 (H) 65 - 99 mg/dL  Glucose, capillary     Status: Abnormal   Collection Time: 05/08/15 11:07 AM  Result Value Ref Range   Glucose-Capillary 146 (H) 65 - 99 mg/dL  Glucose, capillary     Status: Abnormal   Collection Time: 05/08/15  5:57 PM  Result Value Ref Range   Glucose-Capillary 205 (H) 65 - 99 mg/dL  Glucose, capillary     Status: Abnormal   Collection Time: 05/08/15  8:55 PM  Result Value Ref Range   Glucose-Capillary 145 (H) 65 - 99 mg/dL  Heparin level (unfractionated)     Status: None   Collection Time: 05/09/15  4:00 AM  Result Value Ref Range   Heparin Unfractionated 0.33 0.30 - 0.70 IU/mL    Comment:        IF HEPARIN RESULTS ARE BELOW EXPECTED VALUES, AND PATIENT DOSAGE HAS BEEN CONFIRMED, SUGGEST FOLLOW UP TESTING OF ANTITHROMBIN III LEVELS.   CBC     Status: Abnormal   Collection Time: 05/09/15  4:00 AM  Result Value Ref Range   WBC 6.2 4.0 - 10.5 K/uL   RBC 4.14 (L) 4.22 - 5.81 MIL/uL   Hemoglobin 12.9 (L) 13.0  - 17.0 g/dL   HCT 38.3 (L) 39.0 - 52.0 %   MCV 92.5 78.0 - 100.0 fL   MCH 31.2 26.0 - 34.0 pg   MCHC 33.7 30.0 - 36.0 g/dL   RDW 13.2 11.5 - 15.5 %   Platelets 106 (L) 150 - 400 K/uL    Comment: CONSISTENT WITH PREVIOUS RESULT  Basic metabolic panel     Status: Abnormal   Collection Time: 05/09/15  4:00 AM  Result Value Ref Range   Sodium 140 135 - 145 mmol/L   Potassium 4.3 3.5 - 5.1 mmol/L   Chloride 105 101 - 111 mmol/L   CO2 27 22 - 32 mmol/L   Glucose, Bld 132 (H) 65 - 99 mg/dL   BUN 10 6 - 20 mg/dL   Creatinine, Ser 1.18 0.61 - 1.24 mg/dL   Calcium 8.6 (L) 8.9 - 10.3 mg/dL   GFR calc non Af Amer >60 >60 mL/min   GFR calc Af Amer >60 >60 mL/min    Comment: (NOTE) The eGFR has been calculated using the CKD EPI equation. This calculation has not been validated in all clinical situations. eGFR's persistently <60 mL/min signify possible Chronic Kidney Disease.    Anion gap 8 5 - 15  Glucose, capillary     Status: Abnormal   Collection Time: 05/09/15  7:18 AM  Result Value Ref Range   Glucose-Capillary 133 (H) 65 - 99 mg/dL  Glucose, capillary     Status: Abnormal   Collection Time: 05/09/15 11:46 AM  Result Value Ref Range   Glucose-Capillary 158 (H) 65 - 99 mg/dL  Glucose, capillary     Status: Abnormal   Collection Time: 05/09/15  5:06 PM  Result Value Ref Range   Glucose-Capillary  147 (H) 65 - 99 mg/dL  Glucose, capillary     Status: Abnormal   Collection Time: 05/09/15  9:14 PM  Result Value Ref Range   Glucose-Capillary 173 (H) 65 - 99 mg/dL  Heparin level (unfractionated)     Status: None   Collection Time: 05/10/15  3:42 AM  Result Value Ref Range   Heparin Unfractionated 0.41 0.30 - 0.70 IU/mL    Comment:        IF HEPARIN RESULTS ARE BELOW EXPECTED VALUES, AND PATIENT DOSAGE HAS BEEN CONFIRMED, SUGGEST FOLLOW UP TESTING OF ANTITHROMBIN III LEVELS.   CBC     Status: Abnormal   Collection Time: 05/10/15  3:42 AM  Result Value Ref Range   WBC 6.4 4.0 -  10.5 K/uL   RBC 4.16 (L) 4.22 - 5.81 MIL/uL   Hemoglobin 13.2 13.0 - 17.0 g/dL   HCT 38.2 (L) 39.0 - 52.0 %   MCV 91.8 78.0 - 100.0 fL   MCH 31.7 26.0 - 34.0 pg   MCHC 34.6 30.0 - 36.0 g/dL   RDW 13.2 11.5 - 15.5 %   Platelets 136 (L) 150 - 400 K/uL  Glucose, capillary     Status: Abnormal   Collection Time: 05/10/15  7:27 AM  Result Value Ref Range   Glucose-Capillary 107 (H) 65 - 99 mg/dL   Comment 1 Notify RN   Glucose, capillary     Status: Abnormal   Collection Time: 05/10/15 11:42 AM  Result Value Ref Range   Glucose-Capillary 125 (H) 65 - 99 mg/dL   Comment 1 Notify RN   Glucose, capillary     Status: Abnormal   Collection Time: 05/10/15  4:16 PM  Result Value Ref Range   Glucose-Capillary 110 (H) 65 - 99 mg/dL   Comment 1 Notify RN     Assessment/Plan: Pulmonary embolism, bilateral Doing well overall.  Leg pain has resolved. Continue current regimen.  Will transition to 20 mg Xarelto QD next week after completing 21 days of 15 mg BID.  Follow-up with Hematology as directed.

## 2015-05-28 NOTE — Patient Instructions (Signed)
Please finish your last week of the Xarelto 15 mg twice daily then switch to 20 mg tablets once daily. Continue other medications as directed. Follow-up with Dr. Marin Olp as scheduled. If you notice any chest pain or shortness of breath, please call 911.

## 2015-06-04 ENCOUNTER — Telehealth: Payer: Self-pay | Admitting: Physician Assistant

## 2015-06-04 NOTE — Telephone Encounter (Signed)
Caller name: Kennett Relation to DP:BAQV Call back number: [phone] Pharmacy:  Reason for call: PT is having issues with blood sugar level. Pt is needing advise in how to get it to the right level. Please advise.

## 2015-06-04 NOTE — Telephone Encounter (Signed)
Patient informed, understood & agreed/SLS  

## 2015-06-04 NOTE — Telephone Encounter (Signed)
Suspect will go down.  His body has been under tremendous stress with recent PE.  Have him continue medication regimen and minimize carbs. If not decreasing, he is to follow-up next week.

## 2015-06-04 NOTE — Telephone Encounter (Signed)
Spoke with patient and he reports that his CBG this Am was 150; yesterday his Am was 115 and his pm was 251; on 06.08.16 he reports numbers were in range from 1402-150s/SLS Please Advise/SLS

## 2015-06-10 ENCOUNTER — Telehealth: Payer: Self-pay | Admitting: Physician Assistant

## 2015-06-10 NOTE — Telephone Encounter (Signed)
Called and spoke with the pt and informed him of the note below.    Pt verbalized understanding and agreed.  Appt made for the pt.//AB/CMA

## 2015-06-10 NOTE — Telephone Encounter (Signed)
Have him schedule follow-up with me in office.

## 2015-06-10 NOTE — Telephone Encounter (Signed)
Caller name: Xylon Relation to pt: self Call back number: [phone] Pharmacy:  Reason for call:   Patient wants to give [facility] his blood sugar readings:  6/10 4.30pm 233 6/11 6.00am 135 6/12 9.00am 134 6/13 6.00am 138 6/14 6.15am 176 6/15 5.30am 160, 1.30pm 293 6/16 7.00am 135

## 2015-06-11 ENCOUNTER — Ambulatory Visit (INDEPENDENT_AMBULATORY_CARE_PROVIDER_SITE_OTHER): Payer: BLUE CROSS/BLUE SHIELD | Admitting: Physician Assistant

## 2015-06-11 ENCOUNTER — Encounter: Payer: Self-pay | Admitting: Physician Assistant

## 2015-06-11 VITALS — BP 128/86 | HR 72 | Temp 98.4°F | Resp 16 | Ht 68.0 in | Wt 267.2 lb

## 2015-06-11 DIAGNOSIS — E119 Type 2 diabetes mellitus without complications: Secondary | ICD-10-CM | POA: Diagnosis not present

## 2015-06-11 LAB — HEMOGLOBIN A1C: Hgb A1c MFr Bld: 6.9 % — ABNORMAL HIGH (ref 4.6–6.5)

## 2015-06-11 NOTE — Progress Notes (Signed)
Patient presents to clinic today c/o elevated glucose readings at home averaging (fasting) 130s-200.  Denies polydipsia, polyphagia or polyuria.  Diabetes previously well-controlled with diet and exercise.  Past Medical History  Diagnosis Date  . Internal bleeding hemorrhoids 2012    "might bleed once/month; only when I strain"  . Hyperlipidemia   . Clostridium difficile infection 2014  . Testicular hypofunction   . Biceps tendon rupture     bilateral  . GERD (gastroesophageal reflux disease)   . Colon polyps   . Allergy   . Arthritis   . Hypertension   . Diabetes     Borderline/Pre-diabetes  . Leg cramps   . Internal prolapsed hemorrhoids     Current Outpatient Prescriptions on File Prior to Visit  Medication Sig Dispense Refill  . Blood Glucose Monitoring Suppl (RELION CONFIRM GLUCOSE MONITOR) W/DEVICE KIT USE AS DIRECTED TO TEST BLOOD GLUCOSE DAILY DX: 250.00 1 kit 0  . Cholecalciferol (VITAMIN D-3) 5000 UNITS TABS Take 1 tablet by mouth daily.    Marland Kitchen [name] Take 25 mg by mouth daily.    Marland Kitchen docusate sodium (COLACE) 100 MG capsule Take 100 mg by mouth. 1 tablet in the morning and 2 tablets in the evening.    Marland Kitchen EPIPEN 2-PAK 0.3 MG/0.3ML SOAJ injection Inject 0.3 mg into the skin as needed (allergies).     . fenofibrate micronized (LOFIBRA) 134 MG capsule TAKE 1 CAPSULE (134 MG TOTAL) BY MOUTH DAILY BEFORE BREAKFAST. 90 capsule 1  . glucose blood test strip RELION BLOOD GLUCOSE TEST STRIP Use as instructed to test daily Dx: 250.00 50 each 11  . hydrochlorothiazide (HYDRODIURIL) 25 MG tablet Take 1 tablet (25 mg total) by mouth daily. 90 tablet 3  . ipratropium (ATROVENT) 0.06 % nasal spray Place 1 spray into both nostrils 2 (two) times daily.    Marland Kitchen LIVALO 4 MG TABS TAKE 1 TABLET (4 MG TOTAL) BY MOUTH DAILY. 30 tablet 5  . omega-3 acid ethyl esters (LOVAZA) 1 G capsule Take 2 capsules by mouth daily.  1  . potassium gluconate 595 MG TABS tablet Take 595 mg by mouth  daily.     No current facility-administered medications on file prior to visit.    Allergies  Allergen Reactions  . Tetanus Toxoids Anaphylaxis  . Daptomycin Other (See Comments)    myalgias  . Citrus Diarrhea    All citrus fruits  . Milk-Related Compounds Diarrhea    Milk and milk products  . Oxycodone   . Asa [Aspirin] Nausea Only and Rash  . Penicillins Rash  . Prednisone (Pak) [Prednisone] Other (See Comments)    Hyperactivity WITH ORAL, CAN TAKE SHOTS-Cannot do Dose pack    Family History  Problem Relation Age of Onset  . Heart disease Mother 29    Deceased  . Hyperlipidemia Mother   . Hypertension Mother   . Heart disease Father 44    Deceased  . CVA Father   . Hypertension Maternal Grandmother   . Hyperlipidemia Maternal Grandmother   . Cancer Maternal Grandmother     Colon Cancer  . Heart disease Maternal Grandfather   . Hypertension Maternal Grandfather   . Hyperlipidemia Maternal Grandfather   . Cancer Maternal Grandfather     Lung Cancer  . Heart attack Maternal Grandfather   . Heart disease Paternal Grandfather     History   Social History  . Marital Status: Married    Spouse Name: N/A  . Number of Children:  N/A  . Years of Education: N/A   Social History Main Topics  . Smoking status: Never Smoker   . Smokeless tobacco: Never Used  . Alcohol Use: No  . Drug Use: No  . Sexual Activity: Not Currently   Other Topics Concern  . None   Social History Narrative   Marital Status: Married Pamala Hurry)    Children:  Step Daughter Sharyn Lull)    Pets: None    Living Situation: Lives with Pamala Hurry    Occupation: [employer] ([place]    Education: Associate's Degree in [employer]    Tobacco Use/Exposure:  None    Alcohol Use:  Rarely    Drug Use:  None   Diet:  Regular   Exercise: Limited     Hobbies:  Hunting, Fishing              Review of Systems - See HPI.  All other ROS are negative.  BP 128/86 mmHg  Pulse 72  Temp(Src)  98.4 F (36.9 C) (Oral)  Resp 16  Ht 5' 8"  (1.727 m)  Wt 267 lb 4 oz (121.224 kg)  BMI 40.64 kg/m2  SpO2 97%  Physical Exam  Constitutional: He is oriented to person, place, and time and well-developed, well-nourished, and in no distress.  HENT:  Head: Normocephalic and atraumatic.  Cardiovascular: Normal rate, regular rhythm, normal heart sounds and intact distal pulses.   Pulmonary/Chest: Effort normal and breath sounds normal. No respiratory distress. He has no wheezes. He has no rales. He exhibits no tenderness.  Neurological: He is alert and oriented to person, place, and time.  Skin: Skin is warm and dry. No rash noted.  Psychiatric: Affect normal.  Vitals reviewed.   Recent Results (from the past 2160 hour(s))  CBC with Differential     Status: Abnormal   Collection Time: 05/06/15  7:00 PM  Result Value Ref Range   WBC 8.1 4.0 - 10.5 K/uL   RBC 4.57 4.22 - 5.81 MIL/uL   Hemoglobin 14.9 13.0 - 17.0 g/dL   HCT 42.5 39.0 - 52.0 %   MCV 93.0 78.0 - 100.0 fL   MCH 32.6 26.0 - 34.0 pg   MCHC 35.1 30.0 - 36.0 g/dL   RDW 13.1 11.5 - 15.5 %   Platelets 112 (L) 150 - 400 K/uL    Comment: SPECIMEN CHECKED FOR CLOTS PLATELET COUNT CONFIRMED BY SMEAR    Neutrophils Relative % 61 43 - 77 %   Neutro Abs 4.9 1.7 - 7.7 K/uL   Lymphocytes Relative 24 12 - 46 %   Lymphs Abs 1.9 0.7 - 4.0 K/uL   Monocytes Relative 9 3 - 12 %   Monocytes Absolute 0.8 0.1 - 1.0 K/uL   Eosinophils Relative 5 0 - 5 %   Eosinophils Absolute 0.4 0.0 - 0.7 K/uL   Basophils Relative 1 0 - 1 %   Basophils Absolute 0.1 0.0 - 0.1 K/uL  Basic metabolic panel     Status: Abnormal   Collection Time: 05/06/15  7:00 PM  Result Value Ref Range   Sodium 138 135 - 145 mmol/L   Potassium 3.9 3.5 - 5.1 mmol/L   Chloride 105 101 - 111 mmol/L   CO2 25 22 - 32 mmol/L   Glucose, Bld 127 (H) 65 - 99 mg/dL   BUN 18 6 - 20 mg/dL   Creatinine, Ser 1.04 0.61 - 1.24 mg/dL   Calcium 8.9 8.9 - 10.3 mg/dL   GFR calc non  Af  Amer >60 >60 mL/min   GFR calc Af Amer >60 >60 mL/min    Comment: (NOTE) The eGFR has been calculated using the CKD EPI equation. This calculation has not been validated in all clinical situations. eGFR's persistently <60 mL/min signify possible Chronic Kidney Disease.    Anion gap 8 5 - 15  APTT     Status: None   Collection Time: 05/06/15  7:00 PM  Result Value Ref Range   aPTT 31 24 - 37 seconds  Protime-INR     Status: None   Collection Time: 05/06/15  7:00 PM  Result Value Ref Range   Prothrombin Time 13.8 11.6 - 15.2 seconds   INR 1.06 0.00 - 1.49  Troponin I     Status: None   Collection Time: 05/06/15  7:07 PM  Result Value Ref Range   Troponin I <0.03 <0.031 ng/mL    Comment:        NO INDICATION OF MYOCARDIAL INJURY.   CBG monitoring, ED     Status: Abnormal   Collection Time: 05/06/15 11:37 PM  Result Value Ref Range   Glucose-Capillary 121 (H) 65 - 99 mg/dL   Comment 1 Notify RN    Comment 2 Document in Chart   MRSA PCR Screening     Status: Abnormal   Collection Time: 05/07/15  1:19 AM  Result Value Ref Range   MRSA by PCR POSITIVE (A) NEGATIVE    Comment:        The GeneXpert MRSA Assay (FDA approved for NASAL specimens only), is one component of a comprehensive MRSA colonization surveillance program. It is not intended to diagnose MRSA infection nor to guide or monitor treatment for MRSA infections. RESULT CALLED TO, READ BACK BY AND VERIFIED WITH: RN,BRITTANY BUCKNER [id number] @0335  THANEY   Heparin level (unfractionated)     Status: Abnormal   Collection Time: 05/07/15  2:45 AM  Result Value Ref Range   Heparin Unfractionated 0.24 (L) 0.30 - 0.70 IU/mL    Comment:        IF HEPARIN RESULTS ARE BELOW EXPECTED VALUES, AND PATIENT DOSAGE HAS BEEN CONFIRMED, SUGGEST FOLLOW UP TESTING OF ANTITHROMBIN III LEVELS.   CBC     Status: Abnormal   Collection Time: 05/07/15  2:45 AM  Result Value Ref Range   WBC 8.5 4.0 - 10.5 K/uL   RBC 4.14 (L) 4.22  - 5.81 MIL/uL   Hemoglobin 13.5 13.0 - 17.0 g/dL   HCT 38.0 (L) 39.0 - 52.0 %   MCV 91.8 78.0 - 100.0 fL   MCH 32.6 26.0 - 34.0 pg   MCHC 35.5 30.0 - 36.0 g/dL   RDW 13.4 11.5 - 15.5 %   Platelets 104 (L) 150 - 400 K/uL    Comment: PLATELET COUNT CONFIRMED BY SMEAR  Antithrombin III     Status: Abnormal   Collection Time: 05/07/15  2:47 AM  Result Value Ref Range   AntiThromb III Func 60 (L) 75 - 120 %  Protein C activity     Status: None   Collection Time: 05/07/15  2:47 AM  Result Value Ref Range   Protein C Activity 80 74 - 151 %    Comment: (NOTE) Performed At: [facility] [address] [place], [place] [id number] Lindon Romp MD OZ:[id number]   Protein C, total     Status: Abnormal   Collection Time: 05/07/15  2:47 AM  Result Value Ref Range   Protein C, Total 62 (L)  70 - 140 %    Comment: (NOTE) A deficiency of protein C (PC), either congenital or acquired, increases the risk of thromboembolism. Congenital deficiencies of PC are very rare; acquired PC deficiency is much more common. PC levels can be transiently diminished after an acute thrombotic event. Oral anticoagulant therapy with warfarin will lower PC levels as well as vitamin K deficiency. Acquired deficiency can also occur in individuals with disseminated intravascular coagulation (DIC), sepsis, severe liver disease, nephrotic syndrome, and in inflammatory bowel disease. Levels may be spuriously decreased in individuals with Factor V Leiden. Drug therapy with L-asparaginse, fluorouracil, methotrexate, cyclophosphamide or tamoxifen can also reduce PC levels. It has been suggested that repeat blood sampling and testing after ruling out acquired causes of deficiency should be performed before the patient is diagnosed with congenital Protein C deficiency. Performed At: [facility] [place], [place] [id number] Lindon Romp MD BH:[id number]   Protein S activity      Status: None   Collection Time: 05/07/15  2:47 AM  Result Value Ref Range   Protein S Activity 89 60 - 145 %    Comment: (NOTE) Performed At: [facility] [place], [place] [id number] Lindon Romp MD EQ:[id number]   Protein S, total     Status: None   Collection Time: 05/07/15  2:47 AM  Result Value Ref Range   Protein S Ag, Total 92 58 - 150 %    Comment: (NOTE) Performed At: [facility] [place], [place] [id number] Lindon Romp MD ER:[id number]   Lupus anticoagulant panel     Status: Abnormal   Collection Time: 05/07/15  2:47 AM  Result Value Ref Range   PTT Lupus Anticoagulant 57.9 (H) 0.0 - 50.0 sec    Comment: (NOTE) Additional testing confirms the presence of heparin in the test sample. Results obtained after heparin neutralization.    DRVVT 44.9 0.0 - 55.1 sec   Lupus Anticoag Interp Comment:     Comment: (NOTE) No lupus anticoagulant was detected. Results are consistent with the presence of a specific inhibitor of one or more intrinsic pathway factors. These factors include fibrinogen, II, X, V, VIII, IX, XI, XII and the contact factors. It should be noted that mixing studies performed on samples with minimally extended aPTT results can be equivocal.  Normal plasma can overcome weak inhibitors, also resulting in a correction of the mixing study. Performed At: [facility] [place], [place] [id number] Lindon Romp MD OV:[id number]   Beta-2-glycoprotein i abs, IgG/M/A     Status: None   Collection Time: 05/07/15  2:47 AM  Result Value Ref Range   Beta-2 Glyco I IgG <9 0 - 20 GPI IgG units    Comment: (NOTE) The reference interval reflects a 3SD or 99th percentile interval, which is thought to represent a potentially clinically significant result in accordance with the International Consensus Statement on the classification criteria for definitive antiphospholipid syndrome (APS). J  Thromb Haem 2006;4:295-306.    Beta-2-Glycoprotein I IgM <9 0 - 32 GPI IgM units    Comment: (NOTE) The reference interval reflects a 3SD or 99th percentile interval, which is thought to represent a potentially clinically significant result in accordance with the International Consensus Statement on the classification criteria for definitive antiphospholipid syndrome (APS). J Thromb Haem 2006;4:295-306. Performed At: [facility] [place], [place] [id number] Lindon Romp MD HM:[id number]    Beta-2-Glycoprotein I IgA <9  0 - 25 GPI IgA units    Comment: (NOTE) The reference interval reflects a 3SD or 99th percentile interval, which is thought to represent a potentially clinically significant result in accordance with the International Consensus Statement on the classification criteria for definitive antiphospholipid syndrome (APS). J Thromb Haem 2006;4:295-306.   Factor 5 leiden     Status: None   Collection Time: 05/07/15  2:47 AM  Result Value Ref Range   Recommendations-F5LEID: Comment     Comment: (NOTE) Result:  Negative (no mutation found) Factor V Leiden is a specific mutation (R506Q) in the factor V gene that is associated with an increased risk of venous thrombosis. Factor V Leiden is more resistant to inactivation by activated protein C.  As a result, factor V persists in the circulation leading to a mild hyper- coagulable state.  The Leiden mutation accounts for 90% - 95% of APC resistance.  Factor V Leiden has been reported in patients with deep vein thrombosis, pulmonary embolus, central retinal vein occlusion, cerebral sinus thrombosis and hepatic vein thrombosis. Other risk factors to be considered in the workup for venous thrombosis include the G20210A mutation in the factor II (prothrombin) gene, protein S and C deficiency, and antithrombin deficiencies. Anticardiolipin antibody and lupus anticoagulant analysis may be appropriate for  certain patients, as well as homocysteine levels. Contact your local [facility] for information on how to order additi onal testing if desired.    Comment Comment     Comment: (NOTE) **Genetic counselors are available for health care providers to**  discuss results at [phone]). Methodology: DNA analysis of the Factor V gene was performed by allele-specific PCR. The diagnostic sensitivity and specificity is >99% for both. Molecular-based testing is highly accurate, but as in any laboratory test, diagnostic errors may occur. All test results must be combined with clinical information for the most accurate interpretation. References: Voelkerding K (1996).  Clin Lab Med [phone]. Allison Quarry, PhD Berenice Primas, PhD Jens Som, PhD Broadus John, PhD Alfredo Bach, PhD Norva Riffle, PhD Performed At: [facility] [address] [place], [place] [id number] Nechama Guard MD IP:[id number]   Prothrombin gene mutation     Status: None   Collection Time: 05/07/15  2:47 AM  Result Value Ref Range   Recommendations-PTGENE: Comment     Comment: (NOTE) NEGATIVE No mutation identified. Comment: A point mutation (G20210A) in the factor II (prothrombin) gene is the second most common cause of inherited thrombophilia. The incidence of this mutation in the [place] Caucasian population is about 2% and in the [place] American population it is approximately 0.5%. This mutation is rare in the [place] and Native American population. Being heterozygous for a prothrombin mutation increases the risk for developing venous thrombosis about 2 to 3 times above the general population risk. Being homozygous for the prothrombin gene mutation increases the relative risk for venous thrombosis further, although it is not yet known how much further the risk is increased. In women heterozygous for the prothrombin gene mutation, the use of estrogen containing oral contraceptives  increases the relative risk of venous thrombosis about 16 times and the risk of developing cerebral thrombosis is also significantly increased. In pregnancy the pr othrombin gene mutation increases risk for venous thrombosis and may increase risk for stillbirth, placental abruption, pre-eclampsia and fetal growth restriction. If the patient possesses two or more congenital or acquired thrombophilic risk factors, the risk for thrombosis may rise to more than the sum of the risk ratios for the  individual mutations. This assay detects only the prothrombin G20210A mutation and does not measure genetic abnormalities elsewhere in the genome. Other thrombotic risk factors may be pursued through systematic clinical laboratory analysis. These factors include the R506Q (Leiden) mutation in the Factor V gene, plasma homocysteine levels, as well as testing for deficiencies of antithrombin III, protein C and protein S.    Additional Information Comment     Comment: (NOTE) Genetic Counselors are available for health care providers to discuss results at [phone]). Methodology: DNA analysis of the Factor II gene was performed by PCR amplification followed by restriction analysis. The diagnostic sensitivity is >99% for both. All the tests must be combined with clinical information for the most accurate interpretation. Molecular-based testing is highly accurate, but as in any laboratory test, diagnostic errors may occur. Poort SR, et al. Blood. 1996; 46:5035-4656. Varga EA. Circulation. 2004; 812:X51-Z00. Mervin Hack, et [place]; 19:700-703. Allison Quarry, PhD Berenice Primas, PhD Jens Som, PhD Broadus John, PhD Alfredo Bach, PhD Norva Riffle, PhD Performed At: [facility] [address] [place], [place] [id number] Nechama Guard MD RF:[id number]   Cardiolipin antibodies, IgG, IgM, IgA     Status: None   Collection Time:  05/07/15  2:47 AM  Result Value Ref Range   Anticardiolipin IgG <9 0 - 14 GPL U/mL    Comment: (NOTE)                          Negative:              <15                          Indeterminate:     15 - 20                          Low-Med Positive: >20 - 80                          High Positive:         >80    Anticardiolipin IgM <9 0 - 12 MPL U/mL    Comment: (NOTE)                          Negative:              <13                          Indeterminate:     13 - 20                          Low-Med Positive: >20 - 80                          High Positive:         >80    Anticardiolipin IgA <9 0 - 11 APL U/mL    Comment: (NOTE)                          Negative:              <12  Indeterminate:     12 - 20                          Low-Med Positive: >20 - 80                          High Positive:         >80 Performed At: [facility] [place], [place] [id number] Lindon Romp MD [id number]   PTT-LA Mix     Status: None   Collection Time: 05/07/15  2:47 AM  Result Value Ref Range   PTT-LA Mix 45.9 0.0 - 50.0 sec    Comment: (NOTE) Performed At: [facility] [place], [place] [id number] Lindon Romp MD CX:[id number]   PTT-LA Incub Mix     Status: Abnormal   Collection Time: 05/07/15  2:47 AM  Result Value Ref Range   PTT-LA INCUB MIX 51.8 (H) 0.0 - 50.0 sec    Comment: (NOTE) Performed At: [facility] [place], [place] [id number] Lindon Romp MD YI:[id number]   Hexagonal Phase Phospholipid     Status: None   Collection Time: 05/07/15  2:47 AM  Result Value Ref Range   Hexagonal Phase Phospholipid <1.0 0.0 - 8.0 sec    Comment: (NOTE) Performed At: [facility] [address] [place], [place] [id number] Lindon Romp MD [id number]   Homocysteine, serum     Status: None   Collection Time: 05/07/15  6:01 AM  Result Value Ref Range    Homocysteine 12.2 0.0 - 15.0 umol/L    Comment: (NOTE) Performed At: [facility] [place], [place] [id number] Lindon Romp MD [id number]   Troponin I     Status: None   Collection Time: 05/07/15  6:01 AM  Result Value Ref Range   Troponin I <0.03 <0.031 ng/mL    Comment:        NO INDICATION OF MYOCARDIAL INJURY.   Comprehensive metabolic panel     Status: Abnormal   Collection Time: 05/07/15  6:15 AM  Result Value Ref Range   Sodium 138 135 - 145 mmol/L   Potassium 3.8 3.5 - 5.1 mmol/L   Chloride 107 101 - 111 mmol/L   CO2 22 22 - 32 mmol/L   Glucose, Bld 144 (H) 65 - 99 mg/dL   BUN 14 6 - 20 mg/dL   Creatinine, Ser 1.15 0.61 - 1.24 mg/dL   Calcium 8.3 (L) 8.9 - 10.3 mg/dL   Total Protein 5.1 (L) 6.5 - 8.1 g/dL   Albumin 2.8 (L) 3.5 - 5.0 g/dL   AST 23 15 - 41 U/L   ALT 18 17 - 63 U/L   Alkaline Phosphatase 37 (L) 38 - 126 U/L   Total Bilirubin 0.6 0.3 - 1.2 mg/dL   GFR calc non Af Amer >60 >60 mL/min   GFR calc Af Amer >60 >60 mL/min    Comment: (NOTE) The eGFR has been calculated using the CKD EPI equation. This calculation has not been validated in all clinical situations. eGFR's persistently <60 mL/min signify possible Chronic Kidney Disease.    Anion gap 9 5 - 15  CBC     Status: Abnormal   Collection Time: 05/07/15  6:15 AM  Result Value Ref Range   WBC 9.0 4.0 - 10.5 K/uL   RBC 4.09 (L) 4.22 - 5.81 MIL/uL  Hemoglobin 13.1 13.0 - 17.0 g/dL   HCT 37.9 (L) 39.0 - 52.0 %   MCV 92.7 78.0 - 100.0 fL   MCH 32.0 26.0 - 34.0 pg   MCHC 34.6 30.0 - 36.0 g/dL   RDW 13.5 11.5 - 15.5 %   Platelets 94 (L) 150 - 400 K/uL    Comment: CONSISTENT WITH PREVIOUS RESULT  Heparin level (unfractionated)     Status: None   Collection Time: 05/07/15 10:52 AM  Result Value Ref Range   Heparin Unfractionated 0.48 0.30 - 0.70 IU/mL    Comment:        IF HEPARIN RESULTS ARE BELOW EXPECTED VALUES, AND PATIENT DOSAGE HAS BEEN CONFIRMED, SUGGEST  FOLLOW UP TESTING OF ANTITHROMBIN III LEVELS.   Glucose, capillary     Status: Abnormal   Collection Time: 05/07/15 11:59 AM  Result Value Ref Range   Glucose-Capillary 124 (H) 65 - 99 mg/dL  Troponin I (q 6hr x 3)     Status: None   Collection Time: 05/07/15  2:39 PM  Result Value Ref Range   Troponin I <0.03 <0.031 ng/mL    Comment:        NO INDICATION OF MYOCARDIAL INJURY.   Heparin level (unfractionated)     Status: None   Collection Time: 05/07/15  4:24 PM  Result Value Ref Range   Heparin Unfractionated 0.36 0.30 - 0.70 IU/mL    Comment:        IF HEPARIN RESULTS ARE BELOW EXPECTED VALUES, AND PATIENT DOSAGE HAS BEEN CONFIRMED, SUGGEST FOLLOW UP TESTING OF ANTITHROMBIN III LEVELS.   Glucose, capillary     Status: Abnormal   Collection Time: 05/07/15  5:09 PM  Result Value Ref Range   Glucose-Capillary 195 (H) 65 - 99 mg/dL   Comment 1 Notify RN   Glucose, capillary     Status: Abnormal   Collection Time: 05/07/15  9:03 PM  Result Value Ref Range   Glucose-Capillary 146 (H) 65 - 99 mg/dL  Heparin level (unfractionated)     Status: None   Collection Time: 05/08/15  3:40 AM  Result Value Ref Range   Heparin Unfractionated 0.33 0.30 - 0.70 IU/mL    Comment:        IF HEPARIN RESULTS ARE BELOW EXPECTED VALUES, AND PATIENT DOSAGE HAS BEEN CONFIRMED, SUGGEST FOLLOW UP TESTING OF ANTITHROMBIN III LEVELS.   CBC     Status: Abnormal   Collection Time: 05/08/15  3:40 AM  Result Value Ref Range   WBC 6.8 4.0 - 10.5 K/uL   RBC 4.00 (L) 4.22 - 5.81 MIL/uL   Hemoglobin 12.5 (L) 13.0 - 17.0 g/dL   HCT 37.2 (L) 39.0 - 52.0 %   MCV 93.0 78.0 - 100.0 fL   MCH 31.3 26.0 - 34.0 pg   MCHC 33.6 30.0 - 36.0 g/dL   RDW 13.4 11.5 - 15.5 %   Platelets 99 (L) 150 - 400 K/uL    Comment: CONSISTENT WITH PREVIOUS RESULT  Basic metabolic panel     Status: Abnormal   Collection Time: 05/08/15  3:40 AM  Result Value Ref Range   Sodium 136 135 - 145 mmol/L   Potassium 4.2 3.5 - 5.1  mmol/L   Chloride 103 101 - 111 mmol/L   CO2 26 22 - 32 mmol/L   Glucose, Bld 129 (H) 65 - 99 mg/dL   BUN 13 6 - 20 mg/dL   Creatinine, Ser 1.20 0.61 - 1.24 mg/dL   Calcium 8.4 (  L) 8.9 - 10.3 mg/dL   GFR calc non Af Amer >60 >60 mL/min   GFR calc Af Amer >60 >60 mL/min    Comment: (NOTE) The eGFR has been calculated using the CKD EPI equation. This calculation has not been validated in all clinical situations. eGFR's persistently <60 mL/min signify possible Chronic Kidney Disease.    Anion gap 7 5 - 15  Glucose, capillary     Status: Abnormal   Collection Time: 05/08/15  7:41 AM  Result Value Ref Range   Glucose-Capillary 136 (H) 65 - 99 mg/dL  Glucose, capillary     Status: Abnormal   Collection Time: 05/08/15 11:07 AM  Result Value Ref Range   Glucose-Capillary 146 (H) 65 - 99 mg/dL  Glucose, capillary     Status: Abnormal   Collection Time: 05/08/15  5:57 PM  Result Value Ref Range   Glucose-Capillary 205 (H) 65 - 99 mg/dL  Glucose, capillary     Status: Abnormal   Collection Time: 05/08/15  8:55 PM  Result Value Ref Range   Glucose-Capillary 145 (H) 65 - 99 mg/dL  Heparin level (unfractionated)     Status: None   Collection Time: 05/09/15  4:00 AM  Result Value Ref Range   Heparin Unfractionated 0.33 0.30 - 0.70 IU/mL    Comment:        IF HEPARIN RESULTS ARE BELOW EXPECTED VALUES, AND PATIENT DOSAGE HAS BEEN CONFIRMED, SUGGEST FOLLOW UP TESTING OF ANTITHROMBIN III LEVELS.   CBC     Status: Abnormal   Collection Time: 05/09/15  4:00 AM  Result Value Ref Range   WBC 6.2 4.0 - 10.5 K/uL   RBC 4.14 (L) 4.22 - 5.81 MIL/uL   Hemoglobin 12.9 (L) 13.0 - 17.0 g/dL   HCT 38.3 (L) 39.0 - 52.0 %   MCV 92.5 78.0 - 100.0 fL   MCH 31.2 26.0 - 34.0 pg   MCHC 33.7 30.0 - 36.0 g/dL   RDW 13.2 11.5 - 15.5 %   Platelets 106 (L) 150 - 400 K/uL    Comment: CONSISTENT WITH PREVIOUS RESULT  Basic metabolic panel     Status: Abnormal   Collection Time: 05/09/15  4:00 AM  Result  Value Ref Range   Sodium 140 135 - 145 mmol/L   Potassium 4.3 3.5 - 5.1 mmol/L   Chloride 105 101 - 111 mmol/L   CO2 27 22 - 32 mmol/L   Glucose, Bld 132 (H) 65 - 99 mg/dL   BUN 10 6 - 20 mg/dL   Creatinine, Ser 1.18 0.61 - 1.24 mg/dL   Calcium 8.6 (L) 8.9 - 10.3 mg/dL   GFR calc non Af Amer >60 >60 mL/min   GFR calc Af Amer >60 >60 mL/min    Comment: (NOTE) The eGFR has been calculated using the CKD EPI equation. This calculation has not been validated in all clinical situations. eGFR's persistently <60 mL/min signify possible Chronic Kidney Disease.    Anion gap 8 5 - 15  Glucose, capillary     Status: Abnormal   Collection Time: 05/09/15  7:18 AM  Result Value Ref Range   Glucose-Capillary 133 (H) 65 - 99 mg/dL  Glucose, capillary     Status: Abnormal   Collection Time: 05/09/15 11:46 AM  Result Value Ref Range   Glucose-Capillary 158 (H) 65 - 99 mg/dL  Glucose, capillary     Status: Abnormal   Collection Time: 05/09/15  5:06 PM  Result Value Ref Range   Glucose-Capillary  147 (H) 65 - 99 mg/dL  Glucose, capillary     Status: Abnormal   Collection Time: 05/09/15  9:14 PM  Result Value Ref Range   Glucose-Capillary 173 (H) 65 - 99 mg/dL  Heparin level (unfractionated)     Status: None   Collection Time: 05/10/15  3:42 AM  Result Value Ref Range   Heparin Unfractionated 0.41 0.30 - 0.70 IU/mL    Comment:        IF HEPARIN RESULTS ARE BELOW EXPECTED VALUES, AND PATIENT DOSAGE HAS BEEN CONFIRMED, SUGGEST FOLLOW UP TESTING OF ANTITHROMBIN III LEVELS.   CBC     Status: Abnormal   Collection Time: 05/10/15  3:42 AM  Result Value Ref Range   WBC 6.4 4.0 - 10.5 K/uL   RBC 4.16 (L) 4.22 - 5.81 MIL/uL   Hemoglobin 13.2 13.0 - 17.0 g/dL   HCT 38.2 (L) 39.0 - 52.0 %   MCV 91.8 78.0 - 100.0 fL   MCH 31.7 26.0 - 34.0 pg   MCHC 34.6 30.0 - 36.0 g/dL   RDW 13.2 11.5 - 15.5 %   Platelets 136 (L) 150 - 400 K/uL  Glucose, capillary     Status: Abnormal   Collection Time:  05/10/15  7:27 AM  Result Value Ref Range   Glucose-Capillary 107 (H) 65 - 99 mg/dL   Comment 1 Notify RN   Glucose, capillary     Status: Abnormal   Collection Time: 05/10/15 11:42 AM  Result Value Ref Range   Glucose-Capillary 125 (H) 65 - 99 mg/dL   Comment 1 Notify RN   Glucose, capillary     Status: Abnormal   Collection Time: 05/10/15  4:16 PM  Result Value Ref Range   Glucose-Capillary 110 (H) 65 - 99 mg/dL   Comment 1 Notify RN    Assessment/Plan: Type 2 diabetes mellitus without complication Previously well-controlled with diet and exercise. Endorses fasting sugars 140-200.Denies polydipsia, polyuria or polyphagia. Endorses felling "off" when non-fasting sugars are elevated to 260s-300. Will repeat A1C today.  Dietary measures reviewed with patient. Handout given to patient. Will likely start Metformin giving elevations reported in clinic.

## 2015-06-11 NOTE — Assessment & Plan Note (Signed)
Previously well-controlled with diet and exercise. Endorses fasting sugars 140-200.Denies polydipsia, polyuria or polyphagia. Endorses felling "off" when non-fasting sugars are elevated to 260s-300. Will repeat A1C today.  Dietary measures reviewed with patient. Handout given to patient. Will likely start Metformin giving elevations reported in clinic.

## 2015-06-11 NOTE — Progress Notes (Signed)
Pre visit review using our clinic review tool, if applicable. No additional management support is needed unless otherwise documented below in the visit note/SLS  

## 2015-06-11 NOTE — Patient Instructions (Signed)
Please go to the lab for blood work. Follow the dietary handout given today. Remember to minimize those carbohydrates! I will call you with your results and we will start Metformin based on your A1C results.

## 2015-06-14 ENCOUNTER — Telehealth: Payer: Self-pay | Admitting: *Deleted

## 2015-06-14 MED ORDER — METFORMIN HCL 500 MG PO TABS: 500.0000 mg | ORAL_TABLET | Freq: Every day | ORAL | Status: DC

## 2015-06-14 NOTE — Telephone Encounter (Signed)
-----   Message from Brunetta Jeans, PA-C sent at 06/11/2015  2:56 PM EDT ----- A1C still at 6.9 but giving recent elevations in sugars, think we should start Metformin 500 mg once daily. Ok to send in Rx.

## 2015-06-14 NOTE — Telephone Encounter (Signed)
Patient informed, understood & agreed; new Rx to pharmacy/[facility]  

## 2015-06-18 ENCOUNTER — Telehealth: Payer: Self-pay | Admitting: *Deleted

## 2015-06-18 NOTE — Telephone Encounter (Signed)
Prior authorization for omeprazole initiated. Awaiting determination. JG//CMA

## 2015-06-21 ENCOUNTER — Telehealth: Payer: Self-pay | Admitting: Hematology & Oncology

## 2015-06-21 NOTE — Telephone Encounter (Signed)
Left vm w NEW PATIENT today to remind them of their appointment with Dr. Ennever. Also, advised them to bring all medication bottles and insurance card information. ° °

## 2015-06-21 NOTE — Telephone Encounter (Signed)
Caller name: Raquel Sarna with [employer] Can be reached: [phone]  Reason for call: Calling for more info on Prior Auth for meds. Only required if brand name is needed.

## 2015-06-22 ENCOUNTER — Encounter: Payer: Self-pay | Admitting: Hematology & Oncology

## 2015-06-22 ENCOUNTER — Other Ambulatory Visit (HOSPITAL_BASED_OUTPATIENT_CLINIC_OR_DEPARTMENT_OTHER): Payer: BLUE CROSS/BLUE SHIELD

## 2015-06-22 ENCOUNTER — Ambulatory Visit: Payer: BLUE CROSS/BLUE SHIELD

## 2015-06-22 ENCOUNTER — Ambulatory Visit (HOSPITAL_BASED_OUTPATIENT_CLINIC_OR_DEPARTMENT_OTHER): Payer: BLUE CROSS/BLUE SHIELD | Admitting: Hematology & Oncology

## 2015-06-22 VITALS — BP 143/85 | HR 79 | Temp 98.4°F | Resp 20 | Ht 68.0 in | Wt 272.0 lb

## 2015-06-22 DIAGNOSIS — I2699 Other pulmonary embolism without acute cor pulmonale: Secondary | ICD-10-CM

## 2015-06-22 LAB — CBC WITH DIFFERENTIAL (CANCER CENTER ONLY)
BASO#: 0.1 10*3/uL (ref 0.0–0.2)
BASO%: 2.2 % — AB (ref 0.0–2.0)
EOS%: 5.2 % (ref 0.0–7.0)
Eosinophils Absolute: 0.2 10*3/uL (ref 0.0–0.5)
HEMATOCRIT: 36.7 % — AB (ref 38.7–49.9)
HGB: 13.1 g/dL (ref 13.0–17.1)
LYMPH#: 1.1 10*3/uL (ref 0.9–3.3)
LYMPH%: 29.9 % (ref 14.0–48.0)
MCH: 33.4 pg (ref 28.0–33.4)
MCV: 94 fL (ref 82–98)
MONO#: 0.3 10*3/uL (ref 0.1–0.9)
MONO%: 8.7 % (ref 0.0–13.0)
NEUT#: 2 10*3/uL (ref 1.5–6.5)
NEUT%: 54 % (ref 40.0–80.0)
PLATELETS: 144 10*3/uL — AB (ref 145–400)
RBC: 3.92 10*6/uL — ABNORMAL LOW (ref 4.20–5.70)
RDW: 13.5 % (ref 11.1–15.7)
WBC: 3.7 10*3/uL — ABNORMAL LOW (ref 4.0–10.0)

## 2015-06-23 NOTE — Progress Notes (Signed)
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  Chief Complaint  Patient presents with  . NEW PATIENT    ADM 5-20- TO [facility] FOR DVT AND PE  : I have a blood clot in my lungs.  HPI: [name] is a very nice 54 year old white gentleman. 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 He did very well with this.  He works as Freight forwarder. He's been back to work since his surgery.  He began to have some shortness of breath area and this was in May. He had no cough. There is no hemoptysis. He had some chest wall discomfort.  He also noted some slight swelling in the left leg.  At the point where he had a go to the emergency room. He had a CT angiogram done. This showed bilateral point emboli. He had Dopplers done of his legs. The Doppler so that he had a thrombus in his left leg. This extended from the mid thigh to the upper calf.   He is on clomiphene for testosterone production. He spoke to his urologist about this. His urologist said that this would not be implicated with blood clots.  He was started on  Anticoagulation with Xarelto.  You had a hypercoagulable study done. This showed the possibility of a mildly depressed protein C level. He also had a positive lupus anticoagulant. This was not confirmed.  He is coming referred to the [facility]. He is feeling better. His left leg is not as swollen. He is doing some landscaping. He does not have a compression stocking for the left leg.  He's had no bleeding.  He does not smoke. He has diabetes. There is no family history of thromboembolic disease. He is active. He is obese.Marland Kitchen   Past Medical History  Diagnosis Date  . Internal bleeding hemorrhoids 2012    "might bleed once/month; only when I strain"  . Hyperlipidemia   . Clostridium difficile infection 2014  . Testicular hypofunction   . Biceps tendon rupture     bilateral  . GERD  (gastroesophageal reflux disease)   . Colon polyps   . Allergy   . Arthritis   . Hypertension   . Diabetes     Borderline/Pre-diabetes  . Leg cramps   . Internal prolapsed hemorrhoids   :  Past Surgical History  Procedure Laterality Date  . Shoulder arthroscopy w/ rotator cuff repair  07/09/2012    left  . Colonoscopy w/ biopsies and polypectomy  2012  . Shoulder arthroscopy  08/22/2012    Procedure: ARTHROSCOPY SHOULDER;  Surgeon: Marin Shutter, MD;  Location: [place];  Service: Orthopedics;  Laterality: Left;  Left shoulder arthroscopic lavage and debridement  . Shoulder arthroscopy  09/02/2012    Procedure: ARTHROSCOPY SHOULDER;  Surgeon: Marin Shutter, MD;  Location: [place];  Service: Orthopedics;  Laterality: Left;  Left shoulder arthroscopy irrigation and debridement  . Fissurectomy    . Excisional hemorrhoidectomy    . Internal sphincterotomy  06-24-2013  . Hemorrhoid surgery  06/24/2013  . Anal fissure repair  06/24/2013  . Wisdom tooth extraction  YRS AGO  . Skin lesion excision      Benign  . Appendectomy  2011  . Colonscopy  2015  . Lumbar laminectomy/decompression microdiscectomy N/A 01/20/2015    Procedure: MICRO LUMBAR DECOMPRESSION L4-5/L3-4/L2-3;  Surgeon: Johnn Hai, MD;  Location: [facility];  Service: Orthopedics;  Laterality: N/A;  :  Current outpatient prescriptions:  .  Blood Glucose Monitoring Suppl (RELION CONFIRM GLUCOSE MONITOR) W/DEVICE KIT, USE AS DIRECTED TO TEST BLOOD GLUCOSE DAILY DX: 250.00, Disp: 1 kit, Rfl: 0 .  Cholecalciferol (VITAMIN D-3) 5000 UNITS TABS, Take 1 tablet by mouth daily., Disp: , Rfl:  .  [name], Take 25 mg by mouth daily., Disp: , Rfl:  .  docusate sodium (COLACE) 100 MG capsule, Take 100 mg by mouth. 1 tablet in the morning and 2 tablets in the evening., Disp: , Rfl:  .  fenofibrate micronized (LOFIBRA) 134 MG capsule, TAKE 1 CAPSULE (134 MG TOTAL) BY MOUTH DAILY BEFORE BREAKFAST., Disp: 90 capsule, Rfl: 1 .   HYDROcodone-acetaminophen (NORCO) 10-325 MG per tablet, , Disp: , Rfl:  .  LIVALO 4 MG TABS, TAKE 1 TABLET (4 MG TOTAL) BY MOUTH DAILY., Disp: 30 tablet, Rfl: 5 .  methocarbamol (ROBAXIN) 500 MG tablet, Take 500 mg by mouth every 8 (eight) hours as needed for muscle spasms., Disp: , Rfl:  .  omega-3 acid ethyl esters (LOVAZA) 1 G capsule, Take 2 capsules by mouth daily., Disp: , Rfl: 1 .  potassium gluconate 595 MG TABS tablet, Take 595 mg by mouth daily., Disp: , Rfl:  .  rivaroxaban (XARELTO) 20 MG TABS tablet, Take 20 mg by mouth daily., Disp: , Rfl:  .  EPIPEN 2-PAK 0.3 MG/0.3ML SOAJ injection, Inject 0.3 mg into the skin as needed (allergies). , Disp: , Rfl:  .  omeprazole (PRILOSEC) 20 MG capsule, Take 20 mg by mouth daily., Disp: , Rfl: :  :  Allergies  Allergen Reactions  . Tetanus Toxoids Anaphylaxis  . Daptomycin Other (See Comments)    myalgias  . Citrus Diarrhea    All citrus fruits  . Milk-Related Compounds Diarrhea    Milk and milk products  . Oxycodone   . Asa [Aspirin] Nausea Only and Rash  . Penicillins Rash  . Prednisone (Pak) [Prednisone] Other (See Comments)    Hyperactivity WITH ORAL, CAN TAKE SHOTS-Cannot do Dose pack  :  Family History  Problem Relation Age of Onset  . Heart disease Mother 6    Deceased  . Hyperlipidemia Mother   . Hypertension Mother   . Heart disease Father 30    Deceased  . CVA Father   . Hypertension Maternal Grandmother   . Hyperlipidemia Maternal Grandmother   . Cancer Maternal Grandmother     Colon Cancer  . Heart disease Maternal Grandfather   . Hypertension Maternal Grandfather   . Hyperlipidemia Maternal Grandfather   . Cancer Maternal Grandfather     Lung Cancer  . Heart attack Maternal Grandfather   . Heart disease Paternal Grandfather   :  History   Social History  . Marital Status: Married    Spouse Name: N/A  . Number of Children: N/A  . Years of Education: N/A   Occupational History  . Not on file.    Social History Main Topics  . Smoking status: Never Smoker   . Smokeless tobacco: Never Used     Comment: NEVER USED TOBACCO  . Alcohol Use: No  . Drug Use: No  . Sexual Activity: Not Currently   Other Topics Concern  . Not on file   Social History Narrative   Marital Status: Married Ian [name])    Children:  Step Daughter Ian [name])    Pets: None    Living Situation: Lives with Ian [name]    Occupation: [employer])  Education: Associate's Degree in [employer]    Tobacco Use/Exposure:  None    Alcohol Use:  Rarely    Drug Use:  None   Diet:  Regular   Exercise: Limited     Hobbies:  Hunting, Fishing            :  Pertinent items are noted in HPI.  Exam: _0 @ Obese white gentleman in no obvious distress. Vital signs temperature of 98 4. Pulse 79. Blood pressure 143/85 para weight is 232 pounds. Head and neck exam shows no ocular or oral lesions. He has no adenopathy in the neck. Lungs are clear.  Cardiac exam regular rate and rhythm with no murmurs, rubs or bruits. Abdomen is soft. He has good bowel sounds. He is obese. There is no palpable liver or spleen tip. Back exam shows no tenderness over the spine, ribs or hips. Strength is shows some mild nonpitting edema of the left leg. He has a negative Homans sign in the left leg. He has no obvious venous cord. Right leg is unremarkable. Skin exam shows no rashes, ecchymoses or petechia. Neurological exam is nonfocal.   Recent Labs  06/22/15 1023  WBC 3.7*  HGB 13.1  HCT 36.7*  PLT 144*   No results for input(s): NA, K, CL, CO2, GLUCOSE, BUN, CREATININE, CALCIUM in the last 72 hours.  Blood smear review: none  Pathology:none    Assessment and Plan: Mr. Hillmer is a 54 year old white gentleman. He has a pulmonary embolus and a left lower extremity thrombus.  I'm not sure if the low protein C is clinically significant. It is just very mildly depressed. I would think that the lupus  anticoagulant is an acute phase reactant. We will have to repeat these in the future.  I'm not sure if clomiphene could be related to the thromboembolic disease. I will have to ask our pharmacy about this.  He will clearly need at least 1 year of anticoagulation in my opinion. He may need 2 years.  I want to repeat his Doppler and his CT angiogram was see him back. I want to see him back in 2 months.  I spent about 56  Minutes with he and his wife. The both a very nice.  I gave him a prescription for a measured compression stocking for his left leg. I told him to wear this while he is working. 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 He cannot bend and to carry any heavy object. He must drink a lot of water.  I will plan to see him back in 2 months. I answered all their questions.

## 2015-06-24 LAB — D-DIMER, QUANTITATIVE: D-Dimer, Quant: 0.51 ug/mL-FEU — ABNORMAL HIGH (ref 0.00–0.48)

## 2015-06-24 LAB — PROTEIN C, TOTAL: Protein C Antigen: 90 % (ref 70–140)

## 2015-06-24 LAB — PROTEIN C ACTIVITY: Protein C Activity: 168 % — ABNORMAL HIGH (ref 75–133)

## 2015-07-14 ENCOUNTER — Encounter: Payer: Self-pay | Admitting: Physician Assistant

## 2015-07-14 ENCOUNTER — Ambulatory Visit (INDEPENDENT_AMBULATORY_CARE_PROVIDER_SITE_OTHER): Payer: BLUE CROSS/BLUE SHIELD | Admitting: Physician Assistant

## 2015-07-14 VITALS — BP 122/88 | HR 85 | Temp 98.6°F | Ht 68.0 in | Wt 270.1 lb

## 2015-07-14 DIAGNOSIS — E119 Type 2 diabetes mellitus without complications: Secondary | ICD-10-CM | POA: Diagnosis not present

## 2015-07-14 MED ORDER — ZOLPIDEM TARTRATE 5 MG PO TABS: 5.0000 mg | ORAL_TABLET | Freq: Every evening | ORAL | Status: DC | PRN

## 2015-07-14 NOTE — Patient Instructions (Signed)
Please start new dose of Metformin extended release taking as directed. Continue blood glucose checks each morning. Stay well hydrated and eat a healthy snack/meal every 4 hours for metabolism.  Take the Ambien at bedtime. Only take when you can devote 7-8 hours to sleep. Follow-up via phone in 2 weeks to let me know how sleep and sugars are doing.

## 2015-07-14 NOTE — Progress Notes (Signed)
Patient presents to clinic today for follow-up regarding diabetes. Endorses taking Metformin 500 mg once daily as directed. Endorses fasting CBGs averaging 150s. Denies polyuria, polydipsia, polyphagia.  Past Medical History  Diagnosis Date  . Internal bleeding hemorrhoids 2012    "might bleed once/month; only when I strain"  . Hyperlipidemia   . Clostridium difficile infection 2014  . Testicular hypofunction   . Biceps tendon rupture     bilateral  . GERD (gastroesophageal reflux disease)   . Colon polyps   . Allergy   . Arthritis   . Hypertension   . Diabetes     Borderline/Pre-diabetes  . Leg cramps   . Internal prolapsed hemorrhoids     Current Outpatient Prescriptions on File Prior to Visit  Medication Sig Dispense Refill  . Blood Glucose Monitoring Suppl (RELION CONFIRM GLUCOSE MONITOR) W/DEVICE KIT USE AS DIRECTED TO TEST BLOOD GLUCOSE DAILY DX: 250.00 1 kit 0  . Cholecalciferol (VITAMIN D-3) 5000 UNITS TABS Take 1 tablet by mouth daily.    Marland Kitchen [name] Take 25 mg by mouth daily.    Marland Kitchen docusate sodium (COLACE) 100 MG capsule Take 100 mg by mouth. 1 tablet in the morning and 2 tablets in the evening.    Marland Kitchen EPIPEN 2-PAK 0.3 MG/0.3ML SOAJ injection Inject 0.3 mg into the skin as needed (allergies).     . fenofibrate micronized (LOFIBRA) 134 MG capsule TAKE 1 CAPSULE (134 MG TOTAL) BY MOUTH DAILY BEFORE BREAKFAST. 90 capsule 1  . HYDROcodone-acetaminophen (NORCO) 10-325 MG per tablet     . LIVALO 4 MG TABS TAKE 1 TABLET (4 MG TOTAL) BY MOUTH DAILY. 30 tablet 5  . methocarbamol (ROBAXIN) 500 MG tablet Take 500 mg by mouth every 8 (eight) hours as needed for muscle spasms.    Marland Kitchen omega-3 acid ethyl esters (LOVAZA) 1 G capsule Take 2 capsules by mouth daily.  1  . omeprazole (PRILOSEC) 20 MG capsule Take 20 mg by mouth daily.    . potassium gluconate 595 MG TABS tablet Take 595 mg by mouth daily.    . rivaroxaban (XARELTO) 20 MG TABS tablet Take 20 mg by mouth daily.       No current facility-administered medications on file prior to visit.    Allergies  Allergen Reactions  . Tetanus Toxoids Anaphylaxis  . Daptomycin Other (See Comments)    myalgias  . Citrus Diarrhea    All citrus fruits  . Milk-Related Compounds Diarrhea    Milk and milk products  . Oxycodone   . Asa [Aspirin] Nausea Only and Rash  . Penicillins Rash  . Prednisone (Pak) [Prednisone] Other (See Comments)    Hyperactivity WITH ORAL, CAN TAKE SHOTS-Cannot do Dose pack    Family History  Problem Relation Age of Onset  . Heart disease Mother 34    Deceased  . Hyperlipidemia Mother   . Hypertension Mother   . Heart disease Father 109    Deceased  . CVA Father   . Hypertension Maternal Grandmother   . Hyperlipidemia Maternal Grandmother   . Cancer Maternal Grandmother     Colon Cancer  . Heart disease Maternal Grandfather   . Hypertension Maternal Grandfather   . Hyperlipidemia Maternal Grandfather   . Cancer Maternal Grandfather     Lung Cancer  . Heart attack Maternal Grandfather   . Heart disease Paternal Grandfather     History   Social History  . Marital Status: Married    Spouse Name: N/A  .  Number of Children: N/A  . Years of Education: N/A   Social History Main Topics  . Smoking status: Never Smoker   . Smokeless tobacco: Never Used     Comment: NEVER USED TOBACCO  . Alcohol Use: No  . Drug Use: No  . Sexual Activity: Not Currently   Other Topics Concern  . None   Social History Narrative   Marital Status: Married Pamala Hurry)    Children:  Step Daughter Sharyn Lull)    Pets: None    Living Situation: Lives with Pamala Hurry    Occupation: [employer] ([place])    Education: Associate's Degree in [employer]    Tobacco Use/Exposure:  None    Alcohol Use:  Rarely    Drug Use:  None   Diet:  Regular   Exercise: Limited     Hobbies:  Hunting, Fishing             Review of Systems - See HPI.  All other ROS are negative.  BP 122/88 mmHg   Pulse 85  Temp(Src) 98.6 F (37 C) (Other (Comment))  Ht _0  (1.727 m)  Wt 270 lb 2 oz (122.528 kg)  BMI 41.08 kg/m2  SpO2 93%  Physical Exam  Constitutional: He is well-developed, well-nourished, and in no distress.  HENT:  Head: Normocephalic and atraumatic.  Eyes: Conjunctivae are normal.  Cardiovascular: Normal rate, regular rhythm, normal heart sounds and intact distal pulses.   Pulmonary/Chest: Effort normal and breath sounds normal. No respiratory distress. He has no wheezes. He has no rales. He exhibits no tenderness.  Skin: Skin is warm and dry. No rash noted.  Psychiatric: Affect normal.  Vitals reviewed.   Recent Results (from the past 2160 hour(s))  CBC with Differential     Status: Abnormal   Collection Time: 05/06/15  7:00 PM  Result Value Ref Range   WBC 8.1 4.0 - 10.5 K/uL   RBC 4.57 4.22 - 5.81 MIL/uL   Hemoglobin 14.9 13.0 - 17.0 g/dL   HCT 42.5 39.0 - 52.0 %   MCV 93.0 78.0 - 100.0 fL   MCH 32.6 26.0 - 34.0 pg   MCHC 35.1 30.0 - 36.0 g/dL   RDW 13.1 11.5 - 15.5 %   Platelets 112 (L) 150 - 400 K/uL    Comment: SPECIMEN CHECKED FOR CLOTS PLATELET COUNT CONFIRMED BY SMEAR    Neutrophils Relative % 61 43 - 77 %   Neutro Abs 4.9 1.7 - 7.7 K/uL   Lymphocytes Relative 24 12 - 46 %   Lymphs Abs 1.9 0.7 - 4.0 K/uL   Monocytes Relative 9 3 - 12 %   Monocytes Absolute 0.8 0.1 - 1.0 K/uL   Eosinophils Relative 5 0 - 5 %   Eosinophils Absolute 0.4 0.0 - 0.7 K/uL   Basophils Relative 1 0 - 1 %   Basophils Absolute 0.1 0.0 - 0.1 K/uL  Basic metabolic panel     Status: Abnormal   Collection Time: 05/06/15  7:00 PM  Result Value Ref Range   Sodium 138 135 - 145 mmol/L   Potassium 3.9 3.5 - 5.1 mmol/L   Chloride 105 101 - 111 mmol/L   CO2 25 22 - 32 mmol/L   Glucose, Bld 127 (H) 65 - 99 mg/dL   BUN 18 6 - 20 mg/dL   Creatinine, Ser 1.04 0.61 - 1.24 mg/dL   Calcium 8.9 8.9 - 10.3 mg/dL   GFR calc non Af Amer >60 >60 mL/min  GFR calc Af Amer >60 >60 mL/min      Comment: (NOTE) The eGFR has been calculated using the CKD EPI equation. This calculation has not been validated in all clinical situations. eGFR's persistently <60 mL/min signify possible Chronic Kidney Disease.    Anion gap 8 5 - 15  APTT     Status: None   Collection Time: 05/06/15  7:00 PM  Result Value Ref Range   aPTT 31 24 - 37 seconds  Protime-INR     Status: None   Collection Time: 05/06/15  7:00 PM  Result Value Ref Range   Prothrombin Time 13.8 11.6 - 15.2 seconds   INR 1.06 0.00 - 1.49  Troponin I     Status: None   Collection Time: 05/06/15  7:07 PM  Result Value Ref Range   Troponin I <0.03 <0.031 ng/mL    Comment:        NO INDICATION OF MYOCARDIAL INJURY.   CBG monitoring, ED     Status: Abnormal   Collection Time: 05/06/15 11:37 PM  Result Value Ref Range   Glucose-Capillary 121 (H) 65 - 99 mg/dL   Comment 1 Notify RN    Comment 2 Document in Chart   MRSA PCR Screening     Status: Abnormal   Collection Time: 05/07/15  1:19 AM  Result Value Ref Range   MRSA by PCR POSITIVE (A) NEGATIVE    Comment:        The GeneXpert MRSA Assay (FDA approved for NASAL specimens only), is one component of a comprehensive MRSA colonization surveillance program. It is not intended to diagnose MRSA infection nor to guide or monitor treatment for MRSA infections. RESULT CALLED TO, READ BACK BY AND VERIFIED WITH: RN,BRITTANY BUCKNER [id number] _[id number]  THANEY   Heparin level (unfractionated)     Status: Abnormal   Collection Time: 05/07/15  2:45 AM  Result Value Ref Range   Heparin Unfractionated 0.24 (L) 0.30 - 0.70 IU/mL    Comment:        IF HEPARIN RESULTS ARE BELOW EXPECTED VALUES, AND PATIENT DOSAGE HAS BEEN CONFIRMED, SUGGEST FOLLOW UP TESTING OF ANTITHROMBIN III LEVELS.   CBC     Status: Abnormal   Collection Time: 05/07/15  2:45 AM  Result Value Ref Range   WBC 8.5 4.0 - 10.5 K/uL   RBC 4.14 (L) 4.22 - 5.81 MIL/uL   Hemoglobin 13.5 13.0 - 17.0 g/dL    HCT 38.0 (L) 39.0 - 52.0 %   MCV 91.8 78.0 - 100.0 fL   MCH 32.6 26.0 - 34.0 pg   MCHC 35.5 30.0 - 36.0 g/dL   RDW 13.4 11.5 - 15.5 %   Platelets 104 (L) 150 - 400 K/uL    Comment: PLATELET COUNT CONFIRMED BY SMEAR  Antithrombin III     Status: Abnormal   Collection Time: 05/07/15  2:47 AM  Result Value Ref Range   AntiThromb III Func 60 (L) 75 - 120 %  Protein C activity     Status: None   Collection Time: 05/07/15  2:47 AM  Result Value Ref Range   Protein C Activity 80 74 - 151 %    Comment: (NOTE) Performed At: [facility] [address] [place], [place] [id number] Lindon Romp MD ER:[id number]   Protein C, total     Status: Abnormal   Collection Time: 05/07/15  2:47 AM  Result Value Ref Range   Protein C, Total 62 (L) 70 - 140 %  Comment: (NOTE) A deficiency of protein C (PC), either congenital or acquired, increases the risk of thromboembolism. Congenital deficiencies of PC are very rare; acquired PC deficiency is much more common. PC levels can be transiently diminished after an acute thrombotic event. Oral anticoagulant therapy with warfarin will lower PC levels as well as vitamin K deficiency. Acquired deficiency can also occur in individuals with disseminated intravascular coagulation (DIC), sepsis, severe liver disease, nephrotic syndrome, and in inflammatory bowel disease. Levels may be spuriously decreased in individuals with Factor V Leiden. Drug therapy with L-asparaginse, fluorouracil, methotrexate, cyclophosphamide or tamoxifen can also reduce PC levels. It has been suggested that repeat blood sampling and testing after ruling out acquired causes of deficiency should be performed before the patient is diagnosed with congenital Protein C deficiency. Performed At: [facility] [place], [place] [id number] Lindon Romp MD DV:[id number]   Protein S activity     Status: None   Collection Time: 05/07/15  2:47 AM   Result Value Ref Range   Protein S Activity 89 60 - 145 %    Comment: (NOTE) Performed At: [facility] [place], [place] [id number] Lindon Romp MD EV:[id number]   Protein S, total     Status: None   Collection Time: 05/07/15  2:47 AM  Result Value Ref Range   Protein S Ag, Total 92 58 - 150 %    Comment: (NOTE) Performed At: [facility] [place], [place] [id number] Lindon Romp MD VE:[id number]   Lupus anticoagulant panel     Status: Abnormal   Collection Time: 05/07/15  2:47 AM  Result Value Ref Range   PTT Lupus Anticoagulant 57.9 (H) 0.0 - 50.0 sec    Comment: (NOTE) Additional testing confirms the presence of heparin in the test sample. Results obtained after heparin neutralization.    DRVVT 44.9 0.0 - 55.1 sec   Lupus Anticoag Interp Comment:     Comment: (NOTE) No lupus anticoagulant was detected. Results are consistent with the presence of a specific inhibitor of one or more intrinsic pathway factors. These factors include fibrinogen, II, X, V, VIII, IX, XI, XII and the contact factors. It should be noted that mixing studies performed on samples with minimally extended aPTT results can be equivocal.  Normal plasma can overcome weak inhibitors, also resulting in a correction of the mixing study. Performed At: [facility] [place], [place] [id number] Lindon Romp MD [id number]   Beta-2-glycoprotein i abs, IgG/M/A     Status: None   Collection Time: 05/07/15  2:47 AM  Result Value Ref Range   Beta-2 Glyco I IgG <9 0 - 20 GPI IgG units    Comment: (NOTE) The reference interval reflects a 3SD or 99th percentile interval, which is thought to represent a potentially clinically significant result in accordance with the International Consensus Statement on the classification criteria for definitive antiphospholipid syndrome (APS). J Thromb Haem 2006;4:295-306.    Beta-2-Glycoprotein I  IgM <9 0 - 32 GPI IgM units    Comment: (NOTE) The reference interval reflects a 3SD or 99th percentile interval, which is thought to represent a potentially clinically significant result in accordance with the International Consensus Statement on the classification criteria for definitive antiphospholipid syndrome (APS). J Thromb Haem 2006;4:295-306. Performed At: [facility] [place], [place] [id number] Lindon Romp MD JK:[id number]    Beta-2-Glycoprotein I IgA <9 0 - 25 GPI IgA units  Comment: (NOTE) The reference interval reflects a 3SD or 99th percentile interval, which is thought to represent a potentially clinically significant result in accordance with the International Consensus Statement on the classification criteria for definitive antiphospholipid syndrome (APS). J Thromb Haem 2006;4:295-306.   Factor 5 leiden     Status: None   Collection Time: 05/07/15  2:47 AM  Result Value Ref Range   Recommendations-F5LEID: Comment     Comment: (NOTE) Result:  Negative (no mutation found) Factor V Leiden is a specific mutation (R506Q) in the factor V gene that is associated with an increased risk of venous thrombosis. Factor V Leiden is more resistant to inactivation by activated protein C.  As a result, factor V persists in the circulation leading to a mild hyper- coagulable state.  The Leiden mutation accounts for 90% - 95% of APC resistance.  Factor V Leiden has been reported in patients with deep vein thrombosis, pulmonary embolus, central retinal vein occlusion, cerebral sinus thrombosis and hepatic vein thrombosis. Other risk factors to be considered in the workup for venous thrombosis include the G20210A mutation in the factor II (prothrombin) gene, protein S and C deficiency, and antithrombin deficiencies. Anticardiolipin antibody and lupus anticoagulant analysis may be appropriate for certain patients, as well as homocysteine  levels. Contact your local [facility] for information on how to order additi onal testing if desired.    Comment Comment     Comment: (NOTE) **Genetic counselors are available for health care providers to**  discuss results at [phone]). Methodology: DNA analysis of the Factor V gene was performed by allele-specific PCR. The diagnostic sensitivity and specificity is >99% for both. Molecular-based testing is highly accurate, but as in any laboratory test, diagnostic errors may occur. All test results must be combined with clinical information for the most accurate interpretation. References: Voelkerding K (1996).  Clin Lab Med [id number]. Allison Quarry, PhD Berenice Primas, PhD Jens Som, PhD Broadus John, PhD Alfredo Bach, PhD Norva Riffle, PhD Performed At: [facility] [address] [place], [place] [id number] Nechama Guard MD CB:[id number]   Prothrombin gene mutation     Status: None   Collection Time: 05/07/15  2:47 AM  Result Value Ref Range   Recommendations-PTGENE: Comment     Comment: (NOTE) NEGATIVE No mutation identified. Comment: A point mutation (G20210A) in the factor II (prothrombin) gene is the second most common cause of inherited thrombophilia. The incidence of this mutation in the [place] Caucasian population is about 2% and in the [place] American population it is approximately 0.5%. This mutation is rare in the [place] and Native American population. Being heterozygous for a prothrombin mutation increases the risk for developing venous thrombosis about 2 to 3 times above the general population risk. Being homozygous for the prothrombin gene mutation increases the relative risk for venous thrombosis further, although it is not yet known how much further the risk is increased. In women heterozygous for the prothrombin gene mutation, the use of estrogen containing oral contraceptives increases the relative risk of  venous thrombosis about 16 times and the risk of developing cerebral thrombosis is also significantly increased. In pregnancy the pr othrombin gene mutation increases risk for venous thrombosis and may increase risk for stillbirth, placental abruption, pre-eclampsia and fetal growth restriction. If the patient possesses two or more congenital or acquired thrombophilic risk factors, the risk for thrombosis may rise to more than the sum of the risk ratios for the individual mutations. This assay detects only the prothrombin G20210A  mutation and does not measure genetic abnormalities elsewhere in the genome. Other thrombotic risk factors may be pursued through systematic clinical laboratory analysis. These factors include the R506Q (Leiden) mutation in the Factor V gene, plasma homocysteine levels, as well as testing for deficiencies of antithrombin III, protein C and protein S.    Additional Information Comment     Comment: (NOTE) Genetic Counselors are available for health care providers to discuss results at [phone]). Methodology: DNA analysis of the Factor II gene was performed by PCR amplification followed by restriction analysis. The diagnostic sensitivity is >99% for both. All the tests must be combined with clinical information for the most accurate interpretation. Molecular-based testing is highly accurate, but as in any laboratory test, diagnostic errors may occur. Poort SR, et al. Blood. 1996; 67:2094-7096. Varga EA. Circulation. 2004; 283:M62-H47. Mervin Hack, et [place]; 19:700-703. Allison Quarry, PhD Berenice Primas, PhD Jens Som, PhD Broadus John, PhD Alfredo Bach, PhD Norva Riffle, PhD Performed At: [facility] [address] [place], [place] [id number] Nechama Guard MD [id number]   Cardiolipin antibodies, IgG, IgM, IgA     Status: None   Collection Time: 05/07/15  2:47 AM  Result Value  Ref Range   Anticardiolipin IgG <9 0 - 14 GPL U/mL    Comment: (NOTE)                          Negative:              <15                          Indeterminate:     15 - 20                          Low-Med Positive: >20 - 80                          High Positive:         >80    Anticardiolipin IgM <9 0 - 12 MPL U/mL    Comment: (NOTE)                          Negative:              <13                          Indeterminate:     13 - 20                          Low-Med Positive: >20 - 80                          High Positive:         >80    Anticardiolipin IgA <9 0 - 11 APL U/mL    Comment: (NOTE)                          Negative:              <12  Indeterminate:     12 - 20                          Low-Med Positive: >20 - 80                          High Positive:         >80 Performed At: [facility] [place], [place] [id number] Lindon Romp MD ER:[id number]   PTT-LA Mix     Status: None   Collection Time: 05/07/15  2:47 AM  Result Value Ref Range   PTT-LA Mix 45.9 0.0 - 50.0 sec    Comment: (NOTE) Performed At: [facility] [place], [place] [id number] Lindon Romp MD IW:[id number]   PTT-LA Incub Mix     Status: Abnormal   Collection Time: 05/07/15  2:47 AM  Result Value Ref Range   PTT-LA INCUB MIX 51.8 (H) 0.0 - 50.0 sec    Comment: (NOTE) Performed At: [facility] [place], [place] [id number] Lindon Romp MD AL:[id number]   Hexagonal Phase Phospholipid     Status: None   Collection Time: 05/07/15  2:47 AM  Result Value Ref Range   Hexagonal Phase Phospholipid <1.0 0.0 - 8.0 sec    Comment: (NOTE) Performed At: [facility] [address] [place], [place] [id number] Lindon Romp MD AS:[id number]   Homocysteine, serum     Status: None   Collection Time: 05/07/15  6:01 AM  Result Value Ref Range   Homocysteine 12.2 0.0 - 15.0 umol/L     Comment: (NOTE) Performed At: [facility] [place], [place] [id number] Lindon Romp MD [id number]   Troponin I     Status: None   Collection Time: 05/07/15  6:01 AM  Result Value Ref Range   Troponin I <0.03 <0.031 ng/mL    Comment:        NO INDICATION OF MYOCARDIAL INJURY.   Comprehensive metabolic panel     Status: Abnormal   Collection Time: 05/07/15  6:15 AM  Result Value Ref Range   Sodium 138 135 - 145 mmol/L   Potassium 3.8 3.5 - 5.1 mmol/L   Chloride 107 101 - 111 mmol/L   CO2 22 22 - 32 mmol/L   Glucose, Bld 144 (H) 65 - 99 mg/dL   BUN 14 6 - 20 mg/dL   Creatinine, Ser 1.15 0.61 - 1.24 mg/dL   Calcium 8.3 (L) 8.9 - 10.3 mg/dL   Total Protein 5.1 (L) 6.5 - 8.1 g/dL   Albumin 2.8 (L) 3.5 - 5.0 g/dL   AST 23 15 - 41 U/L   ALT 18 17 - 63 U/L   Alkaline Phosphatase 37 (L) 38 - 126 U/L   Total Bilirubin 0.6 0.3 - 1.2 mg/dL   GFR calc non Af Amer >60 >60 mL/min   GFR calc Af Amer >60 >60 mL/min    Comment: (NOTE) The eGFR has been calculated using the CKD EPI equation. This calculation has not been validated in all clinical situations. eGFR's persistently <60 mL/min signify possible Chronic Kidney Disease.    Anion gap 9 5 - 15  CBC     Status: Abnormal   Collection Time: 05/07/15  6:15 AM  Result Value Ref Range   WBC 9.0 4.0 - 10.5 K/uL   RBC 4.09 (L) 4.22 - 5.81 MIL/uL  Hemoglobin 13.1 13.0 - 17.0 g/dL   HCT 37.9 (L) 39.0 - 52.0 %   MCV 92.7 78.0 - 100.0 fL   MCH 32.0 26.0 - 34.0 pg   MCHC 34.6 30.0 - 36.0 g/dL   RDW 13.5 11.5 - 15.5 %   Platelets 94 (L) 150 - 400 K/uL    Comment: CONSISTENT WITH PREVIOUS RESULT  Heparin level (unfractionated)     Status: None   Collection Time: 05/07/15 10:52 AM  Result Value Ref Range   Heparin Unfractionated 0.48 0.30 - 0.70 IU/mL    Comment:        IF HEPARIN RESULTS ARE BELOW EXPECTED VALUES, AND PATIENT DOSAGE HAS BEEN CONFIRMED, SUGGEST FOLLOW UP TESTING OF ANTITHROMBIN III  LEVELS.   Glucose, capillary     Status: Abnormal   Collection Time: 05/07/15 11:59 AM  Result Value Ref Range   Glucose-Capillary 124 (H) 65 - 99 mg/dL  Troponin I (q 6hr x 3)     Status: None   Collection Time: 05/07/15  2:39 PM  Result Value Ref Range   Troponin I <0.03 <0.031 ng/mL    Comment:        NO INDICATION OF MYOCARDIAL INJURY.   Heparin level (unfractionated)     Status: None   Collection Time: 05/07/15  4:24 PM  Result Value Ref Range   Heparin Unfractionated 0.36 0.30 - 0.70 IU/mL    Comment:        IF HEPARIN RESULTS ARE BELOW EXPECTED VALUES, AND PATIENT DOSAGE HAS BEEN CONFIRMED, SUGGEST FOLLOW UP TESTING OF ANTITHROMBIN III LEVELS.   Glucose, capillary     Status: Abnormal   Collection Time: 05/07/15  5:09 PM  Result Value Ref Range   Glucose-Capillary 195 (H) 65 - 99 mg/dL   Comment 1 Notify RN   Glucose, capillary     Status: Abnormal   Collection Time: 05/07/15  9:03 PM  Result Value Ref Range   Glucose-Capillary 146 (H) 65 - 99 mg/dL  Heparin level (unfractionated)     Status: None   Collection Time: 05/08/15  3:40 AM  Result Value Ref Range   Heparin Unfractionated 0.33 0.30 - 0.70 IU/mL    Comment:        IF HEPARIN RESULTS ARE BELOW EXPECTED VALUES, AND PATIENT DOSAGE HAS BEEN CONFIRMED, SUGGEST FOLLOW UP TESTING OF ANTITHROMBIN III LEVELS.   CBC     Status: Abnormal   Collection Time: 05/08/15  3:40 AM  Result Value Ref Range   WBC 6.8 4.0 - 10.5 K/uL   RBC 4.00 (L) 4.22 - 5.81 MIL/uL   Hemoglobin 12.5 (L) 13.0 - 17.0 g/dL   HCT 37.2 (L) 39.0 - 52.0 %   MCV 93.0 78.0 - 100.0 fL   MCH 31.3 26.0 - 34.0 pg   MCHC 33.6 30.0 - 36.0 g/dL   RDW 13.4 11.5 - 15.5 %   Platelets 99 (L) 150 - 400 K/uL    Comment: CONSISTENT WITH PREVIOUS RESULT  Basic metabolic panel     Status: Abnormal   Collection Time: 05/08/15  3:40 AM  Result Value Ref Range   Sodium 136 135 - 145 mmol/L   Potassium 4.2 3.5 - 5.1 mmol/L   Chloride 103 101 - 111  mmol/L   CO2 26 22 - 32 mmol/L   Glucose, Bld 129 (H) 65 - 99 mg/dL   BUN 13 6 - 20 mg/dL   Creatinine, Ser 1.20 0.61 - 1.24 mg/dL   Calcium 8.4 (  L) 8.9 - 10.3 mg/dL   GFR calc non Af Amer >60 >60 mL/min   GFR calc Af Amer >60 >60 mL/min    Comment: (NOTE) The eGFR has been calculated using the CKD EPI equation. This calculation has not been validated in all clinical situations. eGFR's persistently <60 mL/min signify possible Chronic Kidney Disease.    Anion gap 7 5 - 15  Glucose, capillary     Status: Abnormal   Collection Time: 05/08/15  7:41 AM  Result Value Ref Range   Glucose-Capillary 136 (H) 65 - 99 mg/dL  Glucose, capillary     Status: Abnormal   Collection Time: 05/08/15 11:07 AM  Result Value Ref Range   Glucose-Capillary 146 (H) 65 - 99 mg/dL  Glucose, capillary     Status: Abnormal   Collection Time: 05/08/15  5:57 PM  Result Value Ref Range   Glucose-Capillary 205 (H) 65 - 99 mg/dL  Glucose, capillary     Status: Abnormal   Collection Time: 05/08/15  8:55 PM  Result Value Ref Range   Glucose-Capillary 145 (H) 65 - 99 mg/dL  Heparin level (unfractionated)     Status: None   Collection Time: 05/09/15  4:00 AM  Result Value Ref Range   Heparin Unfractionated 0.33 0.30 - 0.70 IU/mL    Comment:        IF HEPARIN RESULTS ARE BELOW EXPECTED VALUES, AND PATIENT DOSAGE HAS BEEN CONFIRMED, SUGGEST FOLLOW UP TESTING OF ANTITHROMBIN III LEVELS.   CBC     Status: Abnormal   Collection Time: 05/09/15  4:00 AM  Result Value Ref Range   WBC 6.2 4.0 - 10.5 K/uL   RBC 4.14 (L) 4.22 - 5.81 MIL/uL   Hemoglobin 12.9 (L) 13.0 - 17.0 g/dL   HCT 38.3 (L) 39.0 - 52.0 %   MCV 92.5 78.0 - 100.0 fL   MCH 31.2 26.0 - 34.0 pg   MCHC 33.7 30.0 - 36.0 g/dL   RDW 13.2 11.5 - 15.5 %   Platelets 106 (L) 150 - 400 K/uL    Comment: CONSISTENT WITH PREVIOUS RESULT  Basic metabolic panel     Status: Abnormal   Collection Time: 05/09/15  4:00 AM  Result Value Ref Range   Sodium 140 135  - 145 mmol/L   Potassium 4.3 3.5 - 5.1 mmol/L   Chloride 105 101 - 111 mmol/L   CO2 27 22 - 32 mmol/L   Glucose, Bld 132 (H) 65 - 99 mg/dL   BUN 10 6 - 20 mg/dL   Creatinine, Ser 1.18 0.61 - 1.24 mg/dL   Calcium 8.6 (L) 8.9 - 10.3 mg/dL   GFR calc non Af Amer >60 >60 mL/min   GFR calc Af Amer >60 >60 mL/min    Comment: (NOTE) The eGFR has been calculated using the CKD EPI equation. This calculation has not been validated in all clinical situations. eGFR's persistently <60 mL/min signify possible Chronic Kidney Disease.    Anion gap 8 5 - 15  Glucose, capillary     Status: Abnormal   Collection Time: 05/09/15  7:18 AM  Result Value Ref Range   Glucose-Capillary 133 (H) 65 - 99 mg/dL  Glucose, capillary     Status: Abnormal   Collection Time: 05/09/15 11:46 AM  Result Value Ref Range   Glucose-Capillary 158 (H) 65 - 99 mg/dL  Glucose, capillary     Status: Abnormal   Collection Time: 05/09/15  5:06 PM  Result Value Ref Range   Glucose-Capillary  147 (H) 65 - 99 mg/dL  Glucose, capillary     Status: Abnormal   Collection Time: 05/09/15  9:14 PM  Result Value Ref Range   Glucose-Capillary 173 (H) 65 - 99 mg/dL  Heparin level (unfractionated)     Status: None   Collection Time: 05/10/15  3:42 AM  Result Value Ref Range   Heparin Unfractionated 0.41 0.30 - 0.70 IU/mL    Comment:        IF HEPARIN RESULTS ARE BELOW EXPECTED VALUES, AND PATIENT DOSAGE HAS BEEN CONFIRMED, SUGGEST FOLLOW UP TESTING OF ANTITHROMBIN III LEVELS.   CBC     Status: Abnormal   Collection Time: 05/10/15  3:42 AM  Result Value Ref Range   WBC 6.4 4.0 - 10.5 K/uL   RBC 4.16 (L) 4.22 - 5.81 MIL/uL   Hemoglobin 13.2 13.0 - 17.0 g/dL   HCT 38.2 (L) 39.0 - 52.0 %   MCV 91.8 78.0 - 100.0 fL   MCH 31.7 26.0 - 34.0 pg   MCHC 34.6 30.0 - 36.0 g/dL   RDW 13.2 11.5 - 15.5 %   Platelets 136 (L) 150 - 400 K/uL  Glucose, capillary     Status: Abnormal   Collection Time: 05/10/15  7:27 AM  Result Value Ref  Range   Glucose-Capillary 107 (H) 65 - 99 mg/dL   Comment 1 Notify RN   Glucose, capillary     Status: Abnormal   Collection Time: 05/10/15 11:42 AM  Result Value Ref Range   Glucose-Capillary 125 (H) 65 - 99 mg/dL   Comment 1 Notify RN   Glucose, capillary     Status: Abnormal   Collection Time: 05/10/15  4:16 PM  Result Value Ref Range   Glucose-Capillary 110 (H) 65 - 99 mg/dL   Comment 1 Notify RN   Hemoglobin A1c     Status: Abnormal   Collection Time: 06/11/15 10:32 AM  Result Value Ref Range   Hgb A1c MFr Bld 6.9 (H) 4.6 - 6.5 %    Comment: Glycemic Control Guidelines for People with Diabetes:Non Diabetic:  <6%Goal of Therapy: <7%Additional Action Suggested:  >8%   CBC with Differential ([facility])     Status: Abnormal   Collection Time: 06/22/15 10:23 AM  Result Value Ref Range   WBC 3.7 (L) 4.0 - 10.0 10e3/uL   RBC 3.92 (L) 4.20 - 5.70 10e6/uL   HGB 13.1 13.0 - 17.1 g/dL   HCT 36.7 (L) 38.7 - 49.9 %   MCV 94 82 - 98 fL   MCH 33.4 28.0 - 33.4 pg   MCHC 35.7 Results corrected for Lipemia 32.0 - 35.9 g/dL   RDW 13.5 11.1 - 15.7 %   Platelets 144 (L) 145 - 400 10e3/uL   NEUT# 2.0 1.5 - 6.5 10e3/uL   LYMPH# 1.1 0.9 - 3.3 10e3/uL   MONO# 0.3 0.1 - 0.9 10e3/uL   Eosinophils Absolute 0.2 0.0 - 0.5 10e3/uL   BASO# 0.1 0.0 - 0.2 10e3/uL   NEUT% 54.0 40.0 - 80.0 %   LYMPH% 29.9 14.0 - 48.0 %   MONO% 8.7 0.0 - 13.0 %   EOS% 5.2 0.0 - 7.0 %   BASO% 2.2 (H) 0.0 - 2.0 %  D-dimer, quantitative (not at [facility])     Status: Abnormal   Collection Time: 06/22/15 10:24 AM  Result Value Ref Range   D-Dimer, Quant 0.51 (H) 0.00 - 0.48 ug/mL-FEU    Comment: At the inhouse established cutoff value of 0.48 ug/mL  FEU, thismethology has been documented in the literature to have a sensitivityand negative predictive value of at least 98-99%.  The test resultshould be correlated with an assessment of the clinical  probability ofDVT/VTE.   Protein C activity     Status: Abnormal    Collection Time: 06/22/15 10:24 AM  Result Value Ref Range   Protein C Activity 168 (H) 75 - 133 %  Protein C, total     Status: None   Collection Time: 06/22/15 10:24 AM  Result Value Ref Range   Protein C Antigen 90 70 - 140 %    Comment: Units: % of normal    Assessment/Plan: Type 2 diabetes mellitus without complication Will stop regular Glucophage. Rx Glucophage XR 1000 mg once daily. Continue glucose monitoring. Will follow-up via phone in 2 weeks to assess medication tolerance and fasting CBGs.

## 2015-07-15 NOTE — Assessment & Plan Note (Signed)
Will stop regular Glucophage. Rx Glucophage XR 1000 mg once daily. Continue glucose monitoring. Will follow-up via phone in 2 weeks to assess medication tolerance and fasting CBGs.

## 2015-08-02 ENCOUNTER — Telehealth: Payer: Self-pay | Admitting: Physician Assistant

## 2015-08-02 NOTE — Telephone Encounter (Signed)
Patient Name: Ian [name] DOB: Aug 11, 1961 Initial Comment Caller states he cut his upper lip shaving 30 mins ago and cannot stop bleeding, on Xarelto Nurse Assessment Nurse: Koleen Nimrod, RN, [facility] Date/Time (Eastern Time): 08/02/2015 8:59:26 AM Confirm and document reason for call. If symptomatic, describe symptoms. ---pot nicked himself shaving and it wont stop bleeding. is on xolerto. Has the patient traveled out of the country within the last 30 days? ---No Does the patient require triage? ---Yes Related visit to physician within the last 2 weeks? ---No Does the PT have any chronic conditions? (i.e. diabetes, asthma, etc.) ---Yes List chronic conditions. ---back problems, hypertension, high cholesterol, diabetes. sx blood clots in lungs Guidelines Guideline Title Affirmed Question Affirmed Notes Cuts and Lacerations [1] Bleeding AND [2] won't stop after 10 minutes of direct pressure (using correct technique) Final Disposition User Go to ED Now Koleen Nimrod, RN, [place] [employer] Referrals [facility] - ED [place] - ED Disagree/Comply: Comply

## 2015-08-02 NOTE — Telephone Encounter (Signed)
Patient states he did not go to [facility] [place] [facility] because he was able to get bleeding to stop.  He states it stopped as soon as he hung up with triage nurse.  No concerns. Patient will call back if bleeding begins again or he feels lightheaded or other symptoms arise.

## 2015-08-13 ENCOUNTER — Ambulatory Visit: Payer: BLUE CROSS/BLUE SHIELD | Admitting: Physician Assistant

## 2015-08-16 ENCOUNTER — Encounter: Payer: Self-pay | Admitting: Physician Assistant

## 2015-08-16 ENCOUNTER — Ambulatory Visit (INDEPENDENT_AMBULATORY_CARE_PROVIDER_SITE_OTHER): Payer: BLUE CROSS/BLUE SHIELD | Admitting: Physician Assistant

## 2015-08-16 VITALS — BP 140/80 | HR 85 | Temp 98.4°F | Ht 68.0 in | Wt 271.5 lb

## 2015-08-16 DIAGNOSIS — E119 Type 2 diabetes mellitus without complications: Secondary | ICD-10-CM

## 2015-08-16 DIAGNOSIS — W57XXXA Bitten or stung by nonvenomous insect and other nonvenomous arthropods, initial encounter: Secondary | ICD-10-CM

## 2015-08-16 DIAGNOSIS — T148 Other injury of unspecified body region: Secondary | ICD-10-CM

## 2015-08-16 MED ORDER — CLOBETASOL PROPIONATE 0.05 % EX OINT: 1.0000 "application " | TOPICAL_OINTMENT | Freq: Two times a day (BID) | CUTANEOUS | Status: DC

## 2015-08-16 NOTE — Progress Notes (Signed)
Pre visit review using our clinic review tool, if applicable. No additional management support is needed unless otherwise documented below in the visit note. 

## 2015-08-16 NOTE — Assessment & Plan Note (Signed)
Stable presently. Fasting sugars mostly in range of 100-130. Some elevated fasting CBGs due to inconsistent late-night snacking. Will continue current regimen. Will Follow-up 5 months and repeat A1C. Increase exercise.

## 2015-08-16 NOTE — Patient Instructions (Addendum)
Please continue the Metformin as directed. Continue checking fasting blood sugars. Really watch your nighttime snacking as that is the reason for inconsistent blood sugar levels. Stay active!  For the bites, please apply the clobetasol ointment twice daily. Continue Sarna lotion. Add on a daily Claritin and Benadryl at bedtime. Apply cold compresses to the area. Wash bed linens and clothing. Follow-up if symptoms are not resolving.  Consider using Unisom for sleep at night in lieu of the Ambien.  Follow-up 5 months for diabetes.

## 2015-08-16 NOTE — Progress Notes (Signed)
Patient presents to clinic today for follow-up of Diabetes Mellitus II, previously controlled. Is taking Metformin as directed. Has blood sugar log with him today. Fasting sugars averaging 100-130s with a few outliers in 160s. Patient endorses inconsistency with diet and exercise.   Patient also complains of bites on his thigh that are pruritic. Denies sick contact with similar bites. Denies recent travel.  Past Medical History  Diagnosis Date  . Internal bleeding hemorrhoids 2012    "might bleed once/month; only when I strain"  . Hyperlipidemia   . Clostridium difficile infection 2014  . Testicular hypofunction   . Biceps tendon rupture     bilateral  . GERD (gastroesophageal reflux disease)   . Colon polyps   . Allergy   . Arthritis   . Hypertension   . Diabetes     Borderline/Pre-diabetes  . Leg cramps   . Internal prolapsed hemorrhoids     Current Outpatient Prescriptions on File Prior to Visit  Medication Sig Dispense Refill  . Blood Glucose Monitoring Suppl (RELION CONFIRM GLUCOSE MONITOR) W/DEVICE KIT USE AS DIRECTED TO TEST BLOOD GLUCOSE DAILY DX: 250.00 1 kit 0  . Cholecalciferol (VITAMIN D-3) 5000 UNITS TABS Take 1 tablet by mouth daily.    Marland Kitchen [name] Take 25 mg by mouth daily.    Marland Kitchen docusate sodium (COLACE) 100 MG capsule Take 100 mg by mouth. 1 tablet in the morning and 2 tablets in the evening.    Marland Kitchen EPIPEN 2-PAK 0.3 MG/0.3ML SOAJ injection Inject 0.3 mg into the skin as needed (allergies).     . fenofibrate micronized (LOFIBRA) 134 MG capsule TAKE 1 CAPSULE (134 MG TOTAL) BY MOUTH DAILY BEFORE BREAKFAST. 90 capsule 1  . HYDROcodone-acetaminophen (NORCO) 10-325 MG per tablet     . LIVALO 4 MG TABS TAKE 1 TABLET (4 MG TOTAL) BY MOUTH DAILY. 30 tablet 5  . methocarbamol (ROBAXIN) 500 MG tablet Take 500 mg by mouth every 8 (eight) hours as needed for muscle spasms.    Marland Kitchen omega-3 acid ethyl esters (LOVAZA) 1 G capsule Take 2 capsules by mouth daily.  1  .  omeprazole (PRILOSEC) 20 MG capsule Take 20 mg by mouth daily.    . potassium gluconate 595 MG TABS tablet Take 595 mg by mouth daily.    . rivaroxaban (XARELTO) 20 MG TABS tablet Take 20 mg by mouth daily.    Marland Kitchen zolpidem (AMBIEN) 5 MG tablet Take 1 tablet (5 mg total) by mouth at bedtime as needed for sleep. 30 tablet 0   No current facility-administered medications on file prior to visit.    Allergies  Allergen Reactions  . Tetanus Toxoids Anaphylaxis  . Daptomycin Other (See Comments)    myalgias  . Citrus Diarrhea    All citrus fruits  . Milk-Related Compounds Diarrhea    Milk and milk products  . Oxycodone   . Asa [Aspirin] Nausea Only and Rash  . Penicillins Rash  . Prednisone (Pak) [Prednisone] Other (See Comments)    Hyperactivity WITH ORAL, CAN TAKE SHOTS-Cannot do Dose pack    Family History  Problem Relation Age of Onset  . Heart disease Mother 36    Deceased  . Hyperlipidemia Mother   . Hypertension Mother   . Heart disease Father 77    Deceased  . CVA Father   . Hypertension Maternal Grandmother   . Hyperlipidemia Maternal Grandmother   . Cancer Maternal Grandmother     Colon Cancer  . Heart  disease Maternal Grandfather   . Hypertension Maternal Grandfather   . Hyperlipidemia Maternal Grandfather   . Cancer Maternal Grandfather     Lung Cancer  . Heart attack Maternal Grandfather   . Heart disease Paternal Grandfather     Social History   Social History  . Marital Status: Married    Spouse Name: N/A  . Number of Children: N/A  . Years of Education: N/A   Social History Main Topics  . Smoking status: Never Smoker   . Smokeless tobacco: Never Used     Comment: NEVER USED TOBACCO  . Alcohol Use: No  . Drug Use: No  . Sexual Activity: Not Currently   Other Topics Concern  . None   Social History Narrative   Marital Status: Married Pamala Hurry)    Children:  Step Daughter Sharyn Lull)    Pets: None    Living Situation: Lives with Pamala Hurry     Occupation: [employer]    Education: Associate's Degree in [employer]    Tobacco Use/Exposure:  None    Alcohol Use:  Rarely    Drug Use:  None   Diet:  Regular   Exercise: Limited     Hobbies:  Hunting, Fishing             Review of Systems - See HPI.  All other ROS are negative.  BP 140/80 mmHg  Pulse 85  Temp(Src) 98.4 F (36.9 C) (Oral)  Ht _0  (1.727 m)  Wt 271 lb 8 oz (123.152 kg)  BMI 41.29 kg/m2  SpO2 92%  Physical Exam  Constitutional: He is oriented to person, place, and time and well-developed, well-nourished, and in no distress.  HENT:  Head: Normocephalic and atraumatic.  Eyes: Conjunctivae are normal.  Neck: Neck supple.  Cardiovascular: Normal rate, regular rhythm, normal heart sounds and intact distal pulses.   Pulmonary/Chest: Effort normal and breath sounds normal. No respiratory distress. He has no wheezes. He has no rales. He exhibits no tenderness.  Neurological: He is alert and oriented to person, place, and time.  Skin: Skin is warm and dry. No rash noted.  Vitals reviewed.   Recent Results (from the past 2160 hour(s))  Hemoglobin A1c     Status: Abnormal   Collection Time: 06/11/15 10:32 AM  Result Value Ref Range   Hgb A1c MFr Bld 6.9 (H) 4.6 - 6.5 %    Comment: Glycemic Control Guidelines for People with Diabetes:Non Diabetic:  <6%Goal of Therapy: <7%Additional Action Suggested:  >8%   CBC with Differential [facility])     Status: Abnormal   Collection Time: 06/22/15 10:23 AM  Result Value Ref Range   WBC 3.7 (L) 4.0 - 10.0 10e3/uL   RBC 3.92 (L) 4.20 - 5.70 10e6/uL   HGB 13.1 13.0 - 17.1 g/dL   HCT 36.7 (L) 38.7 - 49.9 %   MCV 94 82 - 98 fL   MCH 33.4 28.0 - 33.4 pg   MCHC 35.7 Results corrected for Lipemia 32.0 - 35.9 g/dL   RDW 13.5 11.1 - 15.7 %   Platelets 144 (L) 145 - 400 10e3/uL   NEUT# 2.0 1.5 - 6.5 10e3/uL   LYMPH# 1.1 0.9 - 3.3 10e3/uL   MONO# 0.3 0.1 - 0.9 10e3/uL   Eosinophils Absolute 0.2 0.0  - 0.5 10e3/uL   BASO# 0.1 0.0 - 0.2 10e3/uL   NEUT% 54.0 40.0 - 80.0 %   LYMPH% 29.9 14.0 - 48.0 %   MONO% 8.7  0.0 - 13.0 %   EOS% 5.2 0.0 - 7.0 %   BASO% 2.2 (H) 0.0 - 2.0 %  D-dimer, quantitative (not at [facility])     Status: Abnormal   Collection Time: 06/22/15 10:24 AM  Result Value Ref Range   D-Dimer, Quant 0.51 (H) 0.00 - 0.48 ug/mL-FEU    Comment: At the inhouse established cutoff value of 0.48 ug/mL FEU, thismethology has been documented in the literature to have a sensitivityand negative predictive value of at least 98-99%.  The test resultshould be correlated with an assessment of the clinical  probability ofDVT/VTE.   Protein C activity     Status: Abnormal   Collection Time: 06/22/15 10:24 AM  Result Value Ref Range   Protein C Activity 168 (H) 75 - 133 %  Protein C, total     Status: None   Collection Time: 06/22/15 10:24 AM  Result Value Ref Range   Protein C Antigen 90 70 - 140 %    Comment: Units: % of normal    Assessment/Plan: Type 2 diabetes mellitus without complication Stable presently. Fasting sugars mostly in range of 100-130. Some elevated fasting CBGs due to inconsistent late-night snacking. Will continue current regimen. Will Follow-up 5 months and repeat A1C. Increase exercise.  Bug bites With mild reaction. Hygiene measures discussed. Rx Clobetasol ointment. Sarna lotion. Claritin AM and Benadryl PM.

## 2015-08-16 NOTE — Assessment & Plan Note (Signed)
With mild reaction. Hygiene measures discussed. Rx Clobetasol ointment. Sarna lotion. Claritin AM and Benadryl PM.

## 2015-08-23 ENCOUNTER — Other Ambulatory Visit (HOSPITAL_BASED_OUTPATIENT_CLINIC_OR_DEPARTMENT_OTHER): Payer: BLUE CROSS/BLUE SHIELD

## 2015-08-23 ENCOUNTER — Ambulatory Visit (HOSPITAL_BASED_OUTPATIENT_CLINIC_OR_DEPARTMENT_OTHER)
Admission: RE | Admit: 2015-08-23 | Discharge: 2015-08-23 | Disposition: A | Discharge status: Home / Self Care | Payer: BLUE CROSS/BLUE SHIELD | Source: Ambulatory Visit | Attending: Hematology & Oncology | Admitting: Hematology & Oncology

## 2015-08-23 ENCOUNTER — Encounter (HOSPITAL_BASED_OUTPATIENT_CLINIC_OR_DEPARTMENT_OTHER): Payer: Self-pay

## 2015-08-23 ENCOUNTER — Ambulatory Visit (HOSPITAL_BASED_OUTPATIENT_CLINIC_OR_DEPARTMENT_OTHER): Payer: BLUE CROSS/BLUE SHIELD | Admitting: Hematology & Oncology

## 2015-08-23 ENCOUNTER — Encounter: Payer: Self-pay | Admitting: Hematology & Oncology

## 2015-08-23 VITALS — BP 150/98 | HR 80 | Temp 97.5°F | Resp 18 | Ht 68.0 in | Wt 272.0 lb

## 2015-08-23 DIAGNOSIS — I2699 Other pulmonary embolism without acute cor pulmonale: Secondary | ICD-10-CM

## 2015-08-23 DIAGNOSIS — I82432 Acute embolism and thrombosis of left popliteal vein: Secondary | ICD-10-CM | POA: Diagnosis not present

## 2015-08-23 DIAGNOSIS — I824Z2 Acute embolism and thrombosis of unspecified deep veins of left distal lower extremity: Secondary | ICD-10-CM

## 2015-08-23 DIAGNOSIS — I82402 Acute embolism and thrombosis of unspecified deep veins of left lower extremity: Secondary | ICD-10-CM | POA: Diagnosis present

## 2015-08-23 LAB — CBC WITH DIFFERENTIAL (CANCER CENTER ONLY)
BASO#: 0.1 10*3/uL (ref 0.0–0.2)
BASO%: 1 % (ref 0.0–2.0)
EOS ABS: 0.4 10*3/uL (ref 0.0–0.5)
EOS%: 6.9 % (ref 0.0–7.0)
HCT: 42.7 % (ref 38.7–49.9)
HGB: 15.6 g/dL (ref 13.0–17.1)
LYMPH#: 1.7 10*3/uL (ref 0.9–3.3)
LYMPH%: 28.1 % (ref 14.0–48.0)
MCH: 33.8 pg — AB (ref 28.0–33.4)
MCHC: 36.5 g/dL — ABNORMAL HIGH (ref 32.0–35.9)
MCV: 93 fL (ref 82–98)
MONO#: 0.7 10*3/uL (ref 0.1–0.9)
MONO%: 11.7 % (ref 0.0–13.0)
NEUT#: 3.1 10*3/uL (ref 1.5–6.5)
NEUT%: 52.3 % (ref 40.0–80.0)
PLATELETS: 132 10*3/uL — AB (ref 145–400)
RBC: 4.61 10*6/uL (ref 4.20–5.70)
RDW: 13.5 % (ref 11.1–15.7)
WBC: 6 10*3/uL (ref 4.0–10.0)

## 2015-08-23 LAB — CMP (CANCER CENTER ONLY)
ALT(SGPT): 35 U/L (ref 10–47)
AST: 39 U/L — ABNORMAL HIGH (ref 11–38)
Albumin: 3.3 g/dL (ref 3.3–5.5)
Alkaline Phosphatase: 68 U/L (ref 26–84)
BUN, Bld: 11 mg/dL (ref 7–22)
CHLORIDE: 101 meq/L (ref 98–108)
CO2: 27 mEq/L (ref 18–33)
Calcium: 9.7 mg/dL (ref 8.0–10.3)
Creat: 1.3 mg/dl — ABNORMAL HIGH (ref 0.6–1.2)
GLUCOSE: 150 mg/dL — AB (ref 73–118)
POTASSIUM: 3.4 meq/L (ref 3.3–4.7)
Sodium: 135 mEq/L (ref 128–145)
Total Bilirubin: 1 mg/dl (ref 0.20–1.60)
Total Protein: 6.6 g/dL (ref 6.4–8.1)

## 2015-08-23 MED ORDER — IOHEXOL 350 MG/ML SOLN
100.0000 mL | Freq: Once | INTRAVENOUS | Status: AC | PRN
  Administered 2015-08-23: 100 mL via INTRAVENOUS

## 2015-08-23 NOTE — Discharge Instructions (Signed)
° ° °  Outpatient Metformin Instructions (Glucophage, Glucovance, Fortamet, Riomet, Metaglip, Glumetza, Actoplus met  Avandamet, Janumet)   Patient: Ian [name]                                                08/23/2015:    Radiology Exam:     As part of your exam today in the Radiology Department, you were given a radiographic contrast material or x-ray dye.  Because you have had this contrast material and you are taking a Metformin drug (Glucophage, Glucovance, Avandamet, Fortamet, Riomet, Metaglip, Glumetza, Actoplus met, Actoplus Met XR, Prandimet or Janumet), please observe the following instructions:   DO NOT  Take this medication for 48 hours after your exam.  Because you have normal renal function and have no comorbidities, you may restart your medication in 48 hours with no need for a renal function test or consultation with your physician.  You have normal renal function but have some comorbidities.  Comorbidities include liver disease, alcohol overuse, heart failure, myocardial or muscular ischemia, sepsis, or other severe infection.  Therefore you should consult your physician before restarting your medication.  You have impaired renal function.  You should consult your physician before restarting your medication and you are advised to get a renal function test before restarting your medication.  Please discuss this with your physician.   Call your doctor before you start taking this medication again.  Your doctor may want to check your kidney function before you start taking this medication again.  I understand these instructions and have had an opportunity to discuss them with Radiology Department personnel.

## 2015-08-23 NOTE — Progress Notes (Signed)
Hematology and Oncology Follow Up Visit  YACOB WILKERSON [id number] 1961-03-13 54 y.o. 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 Coller is back for follow-up. This is a office visit. We first saw him back in June of this year. Is found to have a embolus and is long and clot in hi in late May.  We have him on Xarelto. He is doing well with Xarelto.  We did a hypercoagulable panel on him. This was all normal. We cannot find any hereditary issue.  We went ahead and repeated his CT angiogram today. This was negative for any pulmonary embolism.  We did a Doppler of his left leg. He still had an extensive thrombus in the left leg from the femoral vein down to the gastrocnemius vein.  His left leg is feeling better however. He's not clear of any pain in the left leg. He has a little bit of neuropathic issues with this.  He's had no bleeding. He's had no cough. There's been no nausea or vomiting. He's had no rashes.  Overall, his performance status is ECOG 1.   Medications:  Current outpatient prescriptions:  .  ALLERGIST [name], See admin instructions., Disp: , Rfl: 6 .  Blood Glucose Monitoring Suppl (RELION CONFIRM GLUCOSE MONITOR) W/DEVICE KIT, USE AS DIRECTED TO TEST BLOOD GLUCOSE DAILY DX: 250.00, Disp: 1 kit, Rfl: 0 .  Cholecalciferol (VITAMIN D-3) 5000 UNITS TABS, Take 1 tablet by mouth daily., Disp: , Rfl:  .  clobetasol ointment (TEMOVATE) 7.82 %, Apply 1 application topically 2 (two) times daily., Disp: 30 g, Rfl: 0 .  [name], Take 25 mg by mouth daily., Disp: , Rfl:  .  docusate sodium (COLACE) 100 MG capsule, Take 100 mg by mouth. 1 tablet in the morning and 2 tablets in the evening., Disp: , Rfl:  .  EPIPEN 2-PAK 0.3 MG/0.3ML SOAJ injection, Inject 0.3 mg into the  skin as needed (allergies). , Disp: , Rfl:  .  fenofibrate micronized (LOFIBRA) 134 MG capsule, TAKE 1 CAPSULE (134 MG TOTAL) BY MOUTH DAILY BEFORE BREAKFAST., Disp: 90 capsule, Rfl: 1 .  gabapentin (NEURONTIN) 300 MG capsule, Take 300 mg by mouth 3 (three) times daily., Disp: , Rfl:  .  HYDROcodone-acetaminophen (NORCO) 10-325 MG per tablet, , Disp: , Rfl:  .  LIVALO 4 MG TABS, TAKE 1 TABLET (4 MG TOTAL) BY MOUTH DAILY., Disp: 30 tablet, Rfl: 5 .  metformin (FORTAMET) 1000 MG (OSM) 24 hr tablet, Take 1,000 mg by mouth daily with breakfast., Disp: , Rfl:  .  methocarbamol (ROBAXIN) 500 MG tablet, Take 500 mg by mouth every 8 (eight) hours as needed for muscle spasms., Disp: , Rfl:  .  omega-3 acid ethyl esters (LOVAZA) 1 G capsule, Take 2 capsules by mouth daily., Disp: , Rfl: 1 .  omeprazole (PRILOSEC) 20 MG capsule, Take 20 mg by mouth daily., Disp: , Rfl:  .  potassium gluconate 595 MG TABS tablet, Take 595 mg by mouth daily., Disp: , Rfl:  .  rivaroxaban (XARELTO) 20 MG TABS tablet, Take 20 mg by mouth daily., Disp: , Rfl:  .  zolpidem (AMBIEN) 5 MG tablet, Take 1 tablet (5 mg total) by mouth at bedtime as needed for sleep., Disp: 30 tablet, Rfl: 0  Allergies:  Allergies  Allergen Reactions  . Tetanus Toxoids Anaphylaxis  . Daptomycin Other (See Comments)    myalgias  . Citrus Diarrhea    All citrus fruits  . Milk-Related Compounds Diarrhea    Milk and milk products  . Oxycodone   . Asa [Aspirin] Nausea Only and Rash  . Penicillins Rash  . Prednisone (Pak) [Prednisone] Other (See Comments)    Hyperactivity WITH ORAL, CAN TAKE SHOTS-Cannot do Dose pack    Past Medical History, Surgical history, Social history, and Family History were reviewed and updated.  Review of Systems: As above  Physical Exam:  height is 5' 8"  (1.727 m) and weight is 272 lb (123.378 kg). His oral temperature is 97.5 F (36.4 C). His blood pressure is 150/98 and his pulse is 80. His respiration is 18.    Wt Readings from Last 3 Encounters:  08/23/15 272 lb (123.378 kg)  08/16/15 271 lb 8 oz (123.152 kg)  07/14/15 270 lb 2 oz (122.528 kg)     Obese white gentleman in no obvious distress. Head and neck exam shows no ocular or oral lesions. There are no palpable cervical or supraclavicular lymph nodes. Lungs are clear. Cardiac exam regular rate and rhythm with no murmurs, rubs or bruits. Abdomen is soft. Has good bowel sounds. There is no fluid wave. There is no palpable liver or spleen tip. Back exam shows no tenderness over the spine, ribs or hips. Extremities shows no clubbing, cyanosis or edema. No venous cords noted in the left leg  and has a negative Homans sign with the left leg. Neurological exam is nonfocal. Skin exam shows no rashes, ecchymoses or petechia.  Lab Results  Component Value Date   WBC 6.0 08/23/2015   HGB 15.6 08/23/2015   HCT 42.7 08/23/2015   MCV 93 08/23/2015   PLT 132* 08/23/2015     Chemistry      Component Value Date/Time   NA 135 08/23/2015 0758   NA 140 05/09/2015 0400   K 3.4 08/23/2015 0758   K 4.3 05/09/2015 0400   CL 101 08/23/2015 0758   CL 105 05/09/2015 0400   CO2 27 08/23/2015 0758   CO2 27 05/09/2015 0400   BUN 11 08/23/2015 0758   BUN 10 05/09/2015 0400   CREATININE 1.3* 08/23/2015 0758   CREATININE 1.18 05/09/2015 0400      Component Value Date/Time   CALCIUM 9.7 08/23/2015 0758   CALCIUM 8.6* 05/09/2015 0400   ALKPHOS 68 08/23/2015 0758   ALKPHOS 37* 05/07/2015 0615   AST 39* 08/23/2015 0758   AST 23 05/07/2015 0615   ALT 35 08/23/2015 0758   ALT 18 05/07/2015 0615   BILITOT 1.00 08/23/2015 0758   BILITOT 0.6 05/07/2015 0615         Impression and Plan: Mr. Wafer is a 54 year old white male. He is obese. He still has the thrombus in the left leg.  I'm unsure if the clomiphenethat he is on might be a risk factor. For now, he will stay on this until we can get some more information.  I will see him back in another 3  months. I will do another Doppler of his left leg. I do not have to do a CT angiogram.  He does have a compression stocking that he does wear. He did not wear today because of his examination Doppler study.  He is still very motivated. He wants to get off the blood thinner as soon as possible. I told  him that at least 1 years but we will needed.  We will do a Doppler was see him back.         Volanda Napoleon, MD 8/29/201611:39 AM

## 2015-08-27 ENCOUNTER — Telehealth: Payer: Self-pay | Admitting: Physician Assistant

## 2015-08-27 NOTE — Telephone Encounter (Signed)
Dr. Marin Olp is prescribing this medication as the Hematologist involved in his care. He is determining length of treatment. Patient needs to call his office.

## 2015-08-27 NOTE — Telephone Encounter (Signed)
Pharmacy: [facility] on [address]  Reason for call: Pt needing refill on rivaroxaban (XARELTO) 20 MG TABS tablet. Pt has 6 left. Takes 1/day.

## 2015-08-30 LAB — HM DIABETES EYE EXAM

## 2015-08-31 ENCOUNTER — Ambulatory Visit: Payer: BLUE CROSS/BLUE SHIELD

## 2015-08-31 ENCOUNTER — Other Ambulatory Visit: Payer: Self-pay | Admitting: Physician Assistant

## 2015-08-31 DIAGNOSIS — Z23 Encounter for immunization: Secondary | ICD-10-CM

## 2015-08-31 MED ORDER — INFLUENZA VAC SPLIT QUAD 0.5 ML IM SUSY
0.5000 mL | PREFILLED_SYRINGE | INTRAMUSCULAR | Status: AC
  Administered 2015-08-31: 0.5 mL via INTRAMUSCULAR

## 2015-08-31 NOTE — Progress Notes (Signed)
Pre visit review using our clinic review tool, if applicable. No additional management support is needed unless otherwise documented below in the visit note.  Patient in for Flu immunization. Given Right deltoid,tolerated well.

## 2015-09-01 LAB — PROTEIN C ACTIVITY

## 2015-09-01 LAB — PROTEIN C, TOTAL: PROTEIN C ANTIGEN: 109 % (ref 70–140)

## 2015-09-02 ENCOUNTER — Ambulatory Visit (INDEPENDENT_AMBULATORY_CARE_PROVIDER_SITE_OTHER): Payer: BLUE CROSS/BLUE SHIELD | Admitting: Family Medicine

## 2015-09-02 ENCOUNTER — Encounter: Payer: Self-pay | Admitting: Family Medicine

## 2015-09-02 VITALS — BP 140/82 | HR 88 | Temp 98.2°F | Resp 16 | Wt 266.1 lb

## 2015-09-02 DIAGNOSIS — E119 Type 2 diabetes mellitus without complications: Secondary | ICD-10-CM | POA: Diagnosis not present

## 2015-09-02 NOTE — Progress Notes (Signed)
   Subjective:    Patient ID: Ian [name], male    DOB: 1961/05/09, 54 y.o.   MRN: [id number]  HPI Nausea and diarrhea- pt reports since changing to the Metformin XR 1000mg  'it has really tore up my stomach'.  + cramping, diarrhea, nausea.  sxs directly correlate to medication use.  Pt has not taken Metformin yet today and after loose stools this AM, has held down lunch w/o difficulty.  Pt's last A1C 6.9   Review of Systems For ROS see HPI     Objective:   Physical Exam  Constitutional: He is oriented to person, place, and time. He appears well-developed and well-nourished. No distress.  HENT:  Head: Normocephalic and atraumatic.  Cardiovascular: Normal rate, regular rhythm, normal heart sounds and intact distal pulses.   Pulmonary/Chest: Effort normal and breath sounds normal. No respiratory distress. He has no wheezes. He has no rales.  Abdominal: Bowel sounds are normal. He exhibits no distension. There is no tenderness. There is no rebound and no guarding.  Neurological: He is alert and oriented to person, place, and time. No cranial nerve deficit.  Skin: Skin is warm and dry.  Psychiatric: He has a normal mood and affect. His behavior is normal.  Vitals reviewed.         Assessment & Plan:

## 2015-09-02 NOTE — Patient Instructions (Signed)
Follow up in 3-4 weeks to recheck diabetes w/ Einar Pheasant STOP the Metformin Make sure you limit your carb intake- increase fruits and veggies, lean protein Try and get regular activity as you are able Call with any questions or concerns Hang in there! Happy Early Rudene Anda!!!

## 2015-09-02 NOTE — Assessment & Plan Note (Signed)
Pt is not able to tolerate Metformin.  GI sxs worsened w/ increased dose of Metformin XR.  sxs improved today w/ holding medication.  D/C metformin and f/u w/ PCP in 3-4 weeks to repeat A1C.  Reviewed importance of low carb diet, portion control, and regular activity.  No meds at this time.  PCP to introduce new med depending on A1C level.  Pt expressed understanding and is in agreement w/ plan.

## 2015-09-02 NOTE — Progress Notes (Signed)
Pre visit review using our clinic review tool, if applicable. No additional management support is needed unless otherwise documented below in the visit note. 

## 2015-09-18 ENCOUNTER — Other Ambulatory Visit: Payer: Self-pay | Admitting: Physician Assistant

## 2015-10-01 ENCOUNTER — Ambulatory Visit: Payer: BLUE CROSS/BLUE SHIELD | Admitting: Skilled Nursing Facility1

## 2015-10-11 ENCOUNTER — Telehealth: Payer: Self-pay | Admitting: *Deleted

## 2015-10-11 ENCOUNTER — Ambulatory Visit (INDEPENDENT_AMBULATORY_CARE_PROVIDER_SITE_OTHER): Payer: BLUE CROSS/BLUE SHIELD | Admitting: Physician Assistant

## 2015-10-11 ENCOUNTER — Encounter: Payer: Self-pay | Admitting: Physician Assistant

## 2015-10-11 VITALS — BP 132/88 | HR 69 | Temp 98.0°F | Resp 16 | Ht 68.0 in | Wt 267.2 lb

## 2015-10-11 DIAGNOSIS — E119 Type 2 diabetes mellitus without complications: Secondary | ICD-10-CM | POA: Diagnosis not present

## 2015-10-11 DIAGNOSIS — K219 Gastro-esophageal reflux disease without esophagitis: Secondary | ICD-10-CM | POA: Diagnosis not present

## 2015-10-11 NOTE — Progress Notes (Signed)
Patient presents to clinic today for follow-up of DM II, previously controlled without complication. Patient had to be taken off of his extended release Metformin due to GI side effects. Is watching diet and exercise. Is checking fasting CBGs and are averaging 116-140. Is taking all other medications as directed. Is due for foot exam. Endorses last eye exam 1 month ago with [facility].  Past Medical History  Diagnosis Date  . Internal bleeding hemorrhoids 2012    "might bleed once/month; only when I strain"  . Hyperlipidemia   . Clostridium difficile infection 2014  . Testicular hypofunction   . Biceps tendon rupture     bilateral  . GERD (gastroesophageal reflux disease)   . Colon polyps   . Allergy   . Arthritis   . Hypertension   . Diabetes ([place])     Borderline/Pre-diabetes  . Leg cramps   . Internal prolapsed hemorrhoids     Current Outpatient Prescriptions on File Prior to Visit  Medication Sig Dispense Refill  . ALLERGIST [name] See admin instructions.  6  . Blood Glucose Monitoring Suppl (RELION CONFIRM GLUCOSE MONITOR) W/DEVICE KIT USE AS DIRECTED TO TEST BLOOD GLUCOSE DAILY DX: 250.00 1 kit 0  . Cholecalciferol (VITAMIN D-3) 5000 UNITS TABS Take 1 tablet by mouth daily.    . clobetasol ointment (TEMOVATE) 2.29 % Apply 1 application topically 2 (two) times daily. 30 g 0  . [name] Take 25 mg by mouth daily.    Marland Kitchen docusate sodium (COLACE) 100 MG capsule Take 100 mg by mouth. 1 tablet in the morning and 2 tablets in the evening.    Marland Kitchen EPIPEN 2-PAK 0.3 MG/0.3ML SOAJ injection Inject 0.3 mg into the skin as needed (allergies).     . fenofibrate micronized (LOFIBRA) 134 MG capsule TAKE 1 CAPSULE (134 MG TOTAL) BY MOUTH DAILY BEFORE BREAKFAST. 90 capsule 1  . HYDROcodone-acetaminophen (NORCO) 10-325 MG per tablet     . LIVALO 4 MG TABS TAKE 1 TABLET (4 MG TOTAL) BY MOUTH DAILY. 30 tablet 5  . methocarbamol (ROBAXIN) 500  MG tablet Take 500 mg by mouth every 8 (eight) hours as needed for muscle spasms.    Marland Kitchen omega-3 acid ethyl esters (LOVAZA) 1 G capsule Take 2 capsules by mouth daily.  1  . omega-3 acid ethyl esters (LOVAZA) 1 G capsule TAKE 2 CAPSULES (2 G TOTAL) BY MOUTH 2 (TWO) TIMES DAILY. 120 capsule 1  . omeprazole (PRILOSEC) 20 MG capsule Take 20 mg by mouth daily.    . [name] Take 1 tablet by mouth daily.    Alveda Reasons 20 MG TABS tablet TAKE 1 TABLET BY MOUTH DAILY 30 tablet 2  . zolpidem (AMBIEN) 5 MG tablet Take 1 tablet (5 mg total) by mouth at bedtime as needed for sleep. 30 tablet 0   No current facility-administered medications on file prior to visit.    Allergies  Allergen Reactions  . Tetanus Toxoids Anaphylaxis  . Daptomycin Other (See Comments)    myalgias  . Citrus Diarrhea    All citrus fruits  . Milk-Related Compounds Diarrhea    Milk and milk products  . Oxycodone   . Asa [Aspirin] Nausea Only and Rash  . Penicillins Rash  . Prednisone (Pak) [Prednisone] Other (See Comments)    Hyperactivity WITH ORAL, CAN TAKE SHOTS-Cannot do Dose pack    Family History  Problem Relation Age of Onset  .  Heart disease Mother 33    Deceased  . Hyperlipidemia Mother   . Hypertension Mother   . Heart disease Father 49    Deceased  . CVA Father   . Hypertension Maternal Grandmother   . Hyperlipidemia Maternal Grandmother   . Cancer Maternal Grandmother     Colon Cancer  . Heart disease Maternal Grandfather   . Hypertension Maternal Grandfather   . Hyperlipidemia Maternal Grandfather   . Cancer Maternal Grandfather     Lung Cancer  . Heart attack Maternal Grandfather   . Heart disease Paternal Grandfather     Social History   Social History  . Marital Status: Married    Spouse Name: N/A  . Number of Children: N/A  . Years of Education: N/A   Social History Main Topics  . Smoking status: Never Smoker   . Smokeless tobacco: Never Used     Comment: NEVER USED  TOBACCO  . Alcohol Use: No  . Drug Use: No  . Sexual Activity: Not Currently   Other Topics Concern  . None   Social History Narrative   Marital Status: Married Pamala Hurry)    Children:  Step Daughter Sharyn Lull)    Pets: None    Living Situation: Lives with Pamala Hurry    Occupation: [employer] ([place])    Education: Associate's Degree in [employer]    Tobacco Use/Exposure:  None    Alcohol Use:  Rarely    Drug Use:  None   Diet:  Regular   Exercise: Limited     Hobbies:  Hunting, Fishing             Review of Systems - See HPI.  All other ROS are negative.  BP 132/88 mmHg  Pulse 69  Temp(Src) 98 F (36.7 C) (Oral)  Resp 16  Ht 5' 8"  (1.727 m)  Wt 267 lb 4 oz (121.224 kg)  BMI 40.64 kg/m2  SpO2 98%  Physical Exam  Constitutional: He is oriented to person, place, and time and well-developed, well-nourished, and in no distress.  HENT:  Head: Normocephalic and atraumatic.  Eyes: Conjunctivae are normal.  Neck: Neck supple.  Cardiovascular: Normal rate, regular rhythm, normal heart sounds and intact distal pulses.   Pulmonary/Chest: Effort normal and breath sounds normal. No respiratory distress. He has no wheezes. He has no rales. He exhibits no tenderness.  Neurological: He is alert and oriented to person, place, and time.  Skin: Skin is warm and dry. No rash noted.  Vitals reviewed.  Diabetic Foot Form - Detailed   Diabetic Foot Exam - detailed  Diabetic Foot exam was performed with the following findings:  Yes 10/11/2015  4:12 PM  Visual Foot Exam completed.:  Yes  Is there a history of foot ulcer?:  No  Can the patient see the bottom of their feet?:  Yes  Are the shoes appropriate in style and fit?:  Yes  Is there swelling or and abnormal foot shape?:  No  Are the toenails long?:  No  Are the toenails thick?:  No  Do you have pain in calf while walking?:  No  Is there a claw toe deformity?:  No  Is there elevated skin temparature?:  No  Is there  limited skin dorsiflexion?:  No  Is there foot or ankle muscle weakness?:  No  Are the toenails ingrown?:  No  Normal Range of Motion:  No    Pulse Foot Exam completed.:  Yes  Right posterior Tibialias:  Present Left posterior Tibialias:  Present  Right Dorsalis Pedis:  Diminished, Present Left Dorsalis Pedis:  Present  Sensory Foot Exam Completed.:  Yes  Swelling:  No  Semmes-Weinstein Monofilament Test  R Foot Test Control:  Neg L Foot Test Control:  Neg  R Site 1-Great Toe:  Neg L Site 1-Great Toe:  Neg  R Site 4:  Neg L Site 4:  Neg  R Site 5:  Neg L Site 5:  Neg         Recent Results (from the past 2160 hour(s))  Protein C activity     Status: None   Collection Time: 08/23/15  7:57 AM  Result Value Ref Range   Protein C Activity TNP     Comment: PER CHY: SPECIMEN IS TOO LIPEMIC  Protein C, total     Status: None   Collection Time: 08/23/15  7:57 AM  Result Value Ref Range   Protein C Antigen 109 70 - 140 %    Comment: Specimen moderately lipemicUnits: % of normal  CBC with Differential ([facility])     Status: Abnormal   Collection Time: 08/23/15  7:57 AM  Result Value Ref Range   WBC 6.0 4.0 - 10.0 10e3/uL   RBC 4.61 4.20 - 5.70 10e6/uL   HGB 15.6 13.0 - 17.1 g/dL   HCT 42.7 38.7 - 49.9 %   MCV 93 82 - 98 fL   MCH 33.8 (H) 28.0 - 33.4 pg   MCHC 36.5 (H) 32.0 - 35.9 g/dL   RDW 13.5 11.1 - 15.7 %   Platelets 132 (L) 145 - 400 10e3/uL   NEUT# 3.1 1.5 - 6.5 10e3/uL   LYMPH# 1.7 0.9 - 3.3 10e3/uL   MONO# 0.7 0.1 - 0.9 10e3/uL   Eosinophils Absolute 0.4 0.0 - 0.5 10e3/uL   BASO# 0.1 0.0 - 0.2 10e3/uL   NEUT% 52.3 40.0 - 80.0 %   LYMPH% 28.1 14.0 - 48.0 %   MONO% 11.7 0.0 - 13.0 %   EOS% 6.9 0.0 - 7.0 %   BASO% 1.0 0.0 - 2.0 %  COMPREHENSIVE METABOLIC PANEL ([facility] REFLEX ONLY)     Status: Abnormal   Collection Time: 08/23/15  7:58 AM  Result Value Ref Range   Sodium 135 128 - 145 mEq/L   Potassium 3.4 3.3 - 4.7 mEq/L   Chloride 101 98 - 108 mEq/L   CO2  27 18 - 33 mEq/L   Glucose, Bld 150 (H) 73 - 118 mg/dL   BUN, Bld 11 7 - 22 mg/dL   Creat 1.3 (H) 0.6 - 1.2 mg/dl   Total Bilirubin 1.00 0.20 - 1.60 mg/dl   Alkaline Phosphatase 68 26 - 84 U/L   AST 39 (H) 11 - 38 U/L   ALT(SGPT) 35 10 - 47 U/L   Total Protein 6.6 6.4 - 8.1 g/dL   Albumin 3.3 3.3 - 5.5 g/dL   Calcium 9.7 8.0 - 10.3 mg/dL    Assessment/Plan: Type 2 diabetes mellitus without complication Will repeat BMP, A1C and urine microalbumin. Continue glucose checks, diet and exercise. Will begin Invokana based on A1C results. Foot exam updated with no abnormal findings.

## 2015-10-11 NOTE — Patient Instructions (Signed)
Please go to the lab for blood work. I will call with your results. Based on the results we will be starting Invokana. Please take all other medications as directed.  Follow-up 3 months.

## 2015-10-11 NOTE — Telephone Encounter (Signed)
Called patient to clarify HCTZ 25 mg that was on printed list given by pt, but not in patient's EMR, pt does take and has been added to medication list. Patient forgot while in the office today to request refill on Zolpidem 5 mg/SLS Please Advise on refills.

## 2015-10-11 NOTE — Progress Notes (Signed)
Pre visit review using our clinic review tool, if applicable. No additional management support is needed unless otherwise documented below in the visit note/SLS  

## 2015-10-11 NOTE — Assessment & Plan Note (Signed)
Will repeat BMP, A1C and urine microalbumin. Continue glucose checks, diet and exercise. Will begin Invokana based on A1C results. Foot exam updated with no abnormal findings.

## 2015-10-11 NOTE — Addendum Note (Signed)
Addended by: Rockwell Germany on: 10/11/2015 05:50 PM   Modules accepted: Medications

## 2015-10-12 LAB — MICROALBUMIN / CREATININE URINE RATIO
Creatinine,U: 198.6 mg/dL
MICROALB/CREAT RATIO: 0.4 mg/g (ref 0.0–30.0)

## 2015-10-12 LAB — BASIC METABOLIC PANEL
BUN: 17 mg/dL (ref 6–23)
CALCIUM: 9.9 mg/dL (ref 8.4–10.5)
CO2: 32 mEq/L (ref 19–32)
CREATININE: 1.26 mg/dL (ref 0.40–1.50)
Chloride: 103 mEq/L (ref 96–112)
GFR: 62.9 mL/min (ref >60.00)
Glucose, Bld: 159 mg/dL — ABNORMAL HIGH (ref 70–99)
Potassium: 3.9 mEq/L (ref 3.5–5.1)
SODIUM: 141 meq/L (ref 135–145)

## 2015-10-12 LAB — H. PYLORI BREATH TEST: H. PYLORI BREATH TEST: NOT DETECTED

## 2015-10-12 LAB — HEMOGLOBIN A1C: HEMOGLOBIN A1C: 6.8 % — AB (ref 4.6–6.5)

## 2015-10-12 MED ORDER — ZOLPIDEM TARTRATE 5 MG PO TABS
5.0000 mg | ORAL_TABLET | Freq: Every evening | ORAL | Status: DC | PRN
Start: 2015-10-12 — End: 2016-06-20

## 2015-10-12 NOTE — Telephone Encounter (Addendum)
Rx request faxed to [facility] pharmacy; Patient informed/SLS

## 2015-10-12 NOTE — Telephone Encounter (Signed)
Refill granted. Rx printed and faxed.

## 2015-11-20 ENCOUNTER — Other Ambulatory Visit: Payer: Self-pay | Admitting: Physician Assistant

## 2015-11-22 ENCOUNTER — Encounter: Payer: Self-pay | Admitting: Physician Assistant

## 2015-11-22 ENCOUNTER — Ambulatory Visit (INDEPENDENT_AMBULATORY_CARE_PROVIDER_SITE_OTHER): Payer: BLUE CROSS/BLUE SHIELD | Admitting: Physician Assistant

## 2015-11-22 VITALS — BP 124/85 | HR 80 | Temp 98.1°F | Resp 16 | Ht 68.0 in | Wt 268.4 lb

## 2015-11-22 DIAGNOSIS — J0110 Acute frontal sinusitis, unspecified: Secondary | ICD-10-CM

## 2015-11-22 MED ORDER — DOXYCYCLINE MONOHYDRATE 100 MG PO CAPS: 100.0000 mg | ORAL_CAPSULE | Freq: Two times a day (BID) | ORAL | Status: DC

## 2015-11-22 MED ORDER — OMEPRAZOLE 20 MG PO CPDR
20.0000 mg | DELAYED_RELEASE_CAPSULE | Freq: Every day | ORAL | Status: DC
Start: 2015-11-22 — End: 2016-08-25

## 2015-11-22 NOTE — Progress Notes (Signed)
  Subjective:     Ian [name] is a 54 y.o. male who presents for evaluation of possible sinusitis. Symptoms include achiness, congestion, facial pain, low grade fever, nasal congestion, post nasal drip, productive cough with  yellow colored sputum, sinus pressure, sore throat and wheezing. Onset of symptoms was 1 weeks ago, and has been gradually worsening since that time. Treatment to date: antibiotics -- was given Cefuroxime from ENT. Has been taking as directed.  The following portions of the patient's history were reviewed and updated as appropriate: allergies, current medications, past family history, past medical history, past social history, past surgical history and problem list.  Review of Systems Pertinent items are noted in HPI.   Objective:    BP 124/85 mmHg  Pulse 80  Temp(Src) 98.1 F (36.7 C) (Oral)  Resp 16  Ht 5\' 8"  (1.727 m)  Wt 268 lb 6 oz (121.734 kg)  BMI 40.82 kg/m2  SpO2 96% General appearance: alert, cooperative, appears stated age and no distress Head: Normocephalic, without obvious abnormality, atraumatic Eyes: conjunctivae/corneas clear. PERRL, EOM's intact. Fundi benign. Ears: normal TM's and external ear canals both ears Nose: turbinates red, swollen, sinus tenderness bilateral Throat: lips, mucosa, and tongue normal; teeth and gums normal Lungs: clear to auscultation bilaterally Heart: regular rate and rhythm, S1, S2 normal, no murmur, click, rub or gallop   Assessment:    sinusitis   Plan:    Discussed the diagnosis and treatment of sinusitis. Suggested symptomatic OTC remedies. Nasal saline spray for congestion. Doxycycline per orders. Follow up as needed.

## 2015-11-22 NOTE — Patient Instructions (Signed)
Please take antibiotic as directed.  Increase fluid intake.  Use Saline nasal spray.  Take a daily multivitamin. Continue allergy medications.  Place a humidifier in the bedroom.  Please call or return clinic if symptoms are not improving.  Sinusitis Sinusitis is redness, soreness, and swelling (inflammation) of the paranasal sinuses. Paranasal sinuses are air pockets within the bones of your face (beneath the eyes, the middle of the forehead, or above the eyes). In healthy paranasal sinuses, mucus is able to drain out, and air is able to circulate through them by way of your nose. However, when your paranasal sinuses are inflamed, mucus and air can become trapped. This can allow bacteria and other germs to grow and cause infection. Sinusitis can develop quickly and last only a short time (acute) or continue over a long period (chronic). Sinusitis that lasts for more than 12 weeks is considered chronic.  CAUSES  Causes of sinusitis include:  Allergies.  Structural abnormalities, such as displacement of the cartilage that separates your nostrils (deviated septum), which can decrease the air flow through your nose and sinuses and affect sinus drainage.  Functional abnormalities, such as when the small hairs (cilia) that line your sinuses and help remove mucus do not work properly or are not present. SYMPTOMS  Symptoms of acute and chronic sinusitis are the same. The primary symptoms are pain and pressure around the affected sinuses. Other symptoms include:  Upper toothache.  Earache.  Headache.  Bad breath.  Decreased sense of smell and taste.  A cough, which worsens when you are lying flat.  Fatigue.  Fever.  Thick drainage from your nose, which often is green and may contain pus (purulent).  Swelling and warmth over the affected sinuses. DIAGNOSIS  Your caregiver will perform a physical exam. During the exam, your caregiver may:  Look in your nose for signs of abnormal growths  in your nostrils (nasal polyps).  Tap over the affected sinus to check for signs of infection.  View the inside of your sinuses (endoscopy) with a special imaging device with a light attached (endoscope), which is inserted into your sinuses. If your caregiver suspects that you have chronic sinusitis, one or more of the following tests may be recommended:  Allergy tests.  Nasal culture A sample of mucus is taken from your nose and sent to a lab and screened for bacteria.  Nasal cytology A sample of mucus is taken from your nose and examined by your caregiver to determine if your sinusitis is related to an allergy. TREATMENT  Most cases of acute sinusitis are related to a viral infection and will resolve on their own within 10 days. Sometimes medicines are prescribed to help relieve symptoms (pain medicine, decongestants, nasal steroid sprays, or saline sprays).  However, for sinusitis related to a bacterial infection, your caregiver will prescribe antibiotic medicines. These are medicines that will help kill the bacteria causing the infection.  Rarely, sinusitis is caused by a fungal infection. In theses cases, your caregiver will prescribe antifungal medicine. For some cases of chronic sinusitis, surgery is needed. Generally, these are cases in which sinusitis recurs more than 3 times per year, despite other treatments. HOME CARE INSTRUCTIONS   Drink plenty of water. Water helps thin the mucus so your sinuses can drain more easily.  Use a humidifier.  Inhale steam 3 to 4 times a day (for example, sit in the bathroom with the shower running).  Apply a warm, moist washcloth to your face 3 to 4   times a day, or as directed by your caregiver.  Use saline nasal sprays to help moisten and clean your sinuses.  Take over-the-counter or prescription medicines for pain, discomfort, or fever only as directed by your caregiver. SEEK IMMEDIATE MEDICAL CARE IF:  You have increasing pain or severe  headaches.  You have nausea, vomiting, or drowsiness.  You have swelling around your face.  You have vision problems.  You have a stiff neck.  You have difficulty breathing. MAKE SURE YOU:   Understand these instructions.  Will watch your condition.  Will get help right away if you are not doing well or get worse. Document Released: 12/11/2005 Document Revised: 03/04/2012 Document Reviewed: 12/26/2011 [employer] Patient Information 2014 [employer].   

## 2015-11-22 NOTE — Progress Notes (Signed)
Pre visit review using our clinic review tool, if applicable. No additional management support is needed unless otherwise documented below in the visit note/SLS  

## 2015-12-01 ENCOUNTER — Ambulatory Visit (HOSPITAL_BASED_OUTPATIENT_CLINIC_OR_DEPARTMENT_OTHER): Payer: BLUE CROSS/BLUE SHIELD | Admitting: Family

## 2015-12-01 ENCOUNTER — Ambulatory Visit (HOSPITAL_BASED_OUTPATIENT_CLINIC_OR_DEPARTMENT_OTHER)
Admission: RE | Admit: 2015-12-01 | Discharge: 2015-12-01 | Disposition: A | Discharge status: Home / Self Care | Payer: BLUE CROSS/BLUE SHIELD | Source: Ambulatory Visit | Attending: Hematology & Oncology | Admitting: Hematology & Oncology

## 2015-12-01 ENCOUNTER — Encounter: Payer: Self-pay | Admitting: Family

## 2015-12-01 ENCOUNTER — Other Ambulatory Visit (HOSPITAL_BASED_OUTPATIENT_CLINIC_OR_DEPARTMENT_OTHER): Payer: BLUE CROSS/BLUE SHIELD

## 2015-12-01 VITALS — BP 134/87 | HR 70 | Temp 98.2°F | Resp 16 | Ht 68.0 in | Wt 271.0 lb

## 2015-12-01 DIAGNOSIS — I824Z2 Acute embolism and thrombosis of unspecified deep veins of left distal lower extremity: Secondary | ICD-10-CM | POA: Diagnosis not present

## 2015-12-01 DIAGNOSIS — I82432 Acute embolism and thrombosis of left popliteal vein: Secondary | ICD-10-CM

## 2015-12-01 DIAGNOSIS — I82412 Acute embolism and thrombosis of left femoral vein: Secondary | ICD-10-CM | POA: Diagnosis not present

## 2015-12-01 DIAGNOSIS — Z86718 Personal history of other venous thrombosis and embolism: Secondary | ICD-10-CM | POA: Insufficient documentation

## 2015-12-01 DIAGNOSIS — Z86711 Personal history of pulmonary embolism: Secondary | ICD-10-CM

## 2015-12-01 DIAGNOSIS — M7989 Other specified soft tissue disorders: Secondary | ICD-10-CM | POA: Diagnosis not present

## 2015-12-01 LAB — COMPREHENSIVE METABOLIC PANEL
ALK PHOS: 63 U/L (ref 40–150)
ALT: 24 U/L (ref 0–55)
ANION GAP: 20 meq/L — AB (ref 3–11)
AST: 30 U/L (ref 5–34)
Albumin: 3.4 g/dL — ABNORMAL LOW (ref 3.5–5.0)
BILIRUBIN TOTAL: 0.62 mg/dL (ref 0.20–1.20)
BUN: 13.5 mg/dL (ref 7.0–26.0)
CALCIUM: 9.7 mg/dL (ref 8.4–10.4)
CO2: 15 mEq/L — ABNORMAL LOW (ref 22–29)
CREATININE: 1.1 mg/dL (ref 0.7–1.3)
Chloride: 101 mEq/L (ref 98–109)
EGFR: 71 mL/min/{1.73_m2} — AB (ref >90)
Glucose: 163 mg/dl — ABNORMAL HIGH (ref 70–140)
Potassium: 3.8 mEq/L (ref 3.5–5.1)
Sodium: 136 mEq/L (ref 136–145)
TOTAL PROTEIN: 8.4 g/dL — AB (ref 6.4–8.3)

## 2015-12-01 LAB — CBC WITH DIFFERENTIAL (CANCER CENTER ONLY)
BASO#: 0.1 10*3/uL (ref 0.0–0.2)
BASO%: 1 % (ref 0.0–2.0)
EOS ABS: 0.3 10*3/uL (ref 0.0–0.5)
EOS%: 6 % (ref 0.0–7.0)
HEMATOCRIT: 44 % (ref 38.7–49.9)
HGB: 16.3 g/dL (ref 13.0–17.1)
LYMPH#: 1.2 10*3/uL (ref 0.9–3.3)
LYMPH%: 24 % (ref 14.0–48.0)
MCH: 34.1 pg — AB (ref 28.0–33.4)
MCHC: 37 g/dL — ABNORMAL HIGH (ref 32.0–35.9)
MCV: 92 fL (ref 82–98)
MONO#: 1.2 10*3/uL — AB (ref 0.1–0.9)
MONO%: 23 % — ABNORMAL HIGH (ref 0.0–13.0)
NEUT#: 2.3 10*3/uL (ref 1.5–6.5)
NEUT%: 46 % (ref 40.0–80.0)
PLATELETS: 127 10*3/uL — AB (ref 145–400)
RBC: 4.78 10*6/uL (ref 4.20–5.70)
RDW: 13.4 % (ref 11.1–15.7)
WBC: 5 10*3/uL (ref 4.0–10.0)

## 2015-12-01 NOTE — Progress Notes (Signed)
Hematology and Oncology Follow Up Visit  Ian [name] [id number] 1961/07/26 54 y.o. 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 He is doing well and has no complaints at this time. He did have an US of the left leg which did show a residual thrombus in the femoral and popliteal vein. He will continue on Xarelto 20 mg daily.  CT angio in August showed complete resolution of PEs.  He has had no episodes of bleeding while on Xarelto. He has had a few bruises that resolved quickly.  He has had leg cramps "for years" and has noticed that sometimes they can be worse in the left leg. We did discuss postphlebitic syndrome and the painful effect it can have when he is on his feet for an extended period of time.  He is still working and staying busy at home. He did have a sinus infection a few weeks ago and was treated with antibiotics. During that time he did experience some SOB and a productive cough. These symptoms have since resolved with treatment.  He has had no fever, chills, n/v, rash, dizziness, chest pain, palpitations, abdominal pain or changes in bowel or bladder habits.  He has no swelling, numbness or tingling in his extremities.  He has a fungus on the left great toe nail. His PCP had him start soaking his foot in vinegar and this appears to be improving.  He has a healthy appetite and is making sure to stay well hydrated.   Medications:    Medication List       This list is accurate as of: 12/01/15 10:36 AM.  Always use your most recent med list.               ALLERGIST [name]  Generic drug:  Tuberculin-Allergy Syringes  See admin instructions.     clobetasol ointment 0.05 %  Commonly known as:  TEMOVATE    Apply 1 application topically 2 (two) times daily.     [name]  Take 25 mg by mouth daily.     docusate sodium 100 MG capsule  Commonly known as:  COLACE  Take 100 mg by mouth. 1 tablet in the morning and 2 tablets in the evening.     doxycycline 100 MG capsule  Commonly known as:  MONODOX  Take 1 capsule (100 mg total) by mouth 2 (two) times daily.     [name]  Generic drug:  EPINEPHrine  Inject 0.3 mg into the skin as needed (allergies).     fenofibrate micronized 134 MG capsule  Commonly known as:  LOFIBRA  TAKE 1 CAPSULE (134 MG TOTAL) BY MOUTH DAILY BEFORE BREAKFAST.     hydrochlorothiazide 25 MG tablet  Commonly known as:  HYDRODIURIL  Take 25 mg by mouth daily.     HYDROcodone-acetaminophen 10-325 MG tablet  Commonly known as:  NORCO  Take 1-2 tablets by mouth every 6 (six) hours as needed.     LIVALO 4 MG Tabs  Generic drug:  Pitavastatin Calcium  TAKE 1 TABLET (4 MG TOTAL) BY MOUTH DAILY.     methocarbamol 500 MG tablet  Commonly known as:  ROBAXIN  Take 500 mg by mouth every 8 (eight) hours as needed for muscle spasms.     omega-3 acid ethyl esters 1 G capsule  Commonly known as:  LOVAZA  Take 2 capsules by mouth daily.     omeprazole 20 MG capsule  Commonly known as:  PRILOSEC  Take 1 capsule (20 mg total) by mouth daily.     [name]  Take 1 tablet by mouth daily.     [name]  USE AS DIRECTED TO TEST BLOOD GLUCOSE DAILY DX: 250.00     [name]  Take 1 tablet by mouth daily.     XARELTO 20 MG Tabs tablet  Generic drug:  rivaroxaban  TAKE 1 TABLET BY MOUTH DAILY     zolpidem 5 MG tablet  Commonly known as:  AMBIEN  Take 1 tablet (5 mg total) by mouth at bedtime as needed for sleep.        Allergies:  Allergies  Allergen Reactions  . Tetanus Toxoids Anaphylaxis  . Daptomycin Other (See Comments)    myalgias  . Citrus  Diarrhea    All citrus fruits  . Milk-Related Compounds Diarrhea    Milk and milk products  . Oxycodone   . Asa [Aspirin] Nausea Only and Rash  . Penicillins Rash  . Prednisone (Pak) [Prednisone] Other (See Comments)    Hyperactivity WITH ORAL, CAN TAKE SHOTS-Cannot do Dose pack    Past Medical History, Surgical history, Social history, and Family History were reviewed and updated.  Review of Systems: All other 10 point review of systems is negative.   Physical Exam:  vitals were not taken for this visit.  Wt Readings from Last 3 Encounters:  11/22/15 268 lb 6 oz (121.734 kg)  10/11/15 267 lb 4 oz (121.224 kg)  09/02/15 266 lb 2 oz (120.714 kg)    Ocular: Sclerae unicteric, pupils equal, round and reactive to light Ear-nose-throat: Oropharynx clear, dentition fair Lymphatic: No cervical supraclavicular or axillary adenopathy Lungs no rales or rhonchi, good excursion bilaterally Heart regular rate and rhythm, no murmur appreciated Abd soft, nontender, positive bowel sounds MSK no focal spinal tenderness, no joint edema Neuro: non-focal, well-oriented, appropriate affect Breasts: Deferred  Lab Results  Component Value Date   WBC 6.0 08/23/2015   HGB 15.6 08/23/2015   HCT 42.7 08/23/2015   MCV 93 08/23/2015   PLT 132* 08/23/2015   No results found for: FERRITIN, IRON, TIBC, UIBC, IRONPCTSAT Lab Results  Component Value Date   RBC 4.61 08/23/2015   No results found for: KPAFRELGTCHN, LAMBDASER, KAPLAMBRATIO No results found for: IGGSERUM, IGA, IGMSERUM No results found for: Odetta Pink, SPEI   Chemistry      Component Value Date/Time   NA 141 10/11/2015 1622   NA 135 08/23/2015 0758   K 3.9 10/11/2015 1622   K 3.4 08/23/2015 0758   CL 103 10/11/2015 1622   CL 101 08/23/2015 0758   CO2 32 10/11/2015 1622   CO2 27 08/23/2015 0758   BUN 17 10/11/2015 1622   BUN 11 08/23/2015 0758   CREATININE 1.26  10/11/2015 1622   CREATININE 1.3* 08/23/2015 0758      Component Value Date/Time   CALCIUM 9.9 10/11/2015 1622   CALCIUM 9.7 08/23/2015 0758   ALKPHOS 68 08/23/2015 0758   ALKPHOS 37* 05/07/2015 0615   AST 39* 08/23/2015 0758   AST 23 05/07/2015 0615   ALT  35 08/23/2015 0758   ALT 18 05/07/2015 0615   BILITOT 1.00 08/23/2015 0758   BILITOT 0.6 05/07/2015 0615     Impression and Plan: Ian [name] is a 54 yo white [name] with thrombus in the left leg. His PEs have resolved. [place] today showed residual thrombus in the left femoral and popliteal veins. He will continue on Xarelto 20 mg daily for a full year finishing in May.  He wears his compression stocking as needed. He does have occasional pain and cramping his left leg. There is no swelling or tenderness at this time.  We will plan to see him back in May and do another doppler study of the left leg at that time.  He will contact us with any questions or concerns. We can certainly see him sooner if need be.   Eliezer Bottom, NP 12/7/201610:36 AM

## 2015-12-21 ENCOUNTER — Ambulatory Visit: Payer: BLUE CROSS/BLUE SHIELD | Admitting: Physician Assistant

## 2016-01-11 ENCOUNTER — Ambulatory Visit (INDEPENDENT_AMBULATORY_CARE_PROVIDER_SITE_OTHER): Payer: BLUE CROSS/BLUE SHIELD | Admitting: Physician Assistant

## 2016-01-11 ENCOUNTER — Ambulatory Visit: Payer: BLUE CROSS/BLUE SHIELD | Admitting: Physician Assistant

## 2016-01-11 ENCOUNTER — Encounter: Payer: Self-pay | Admitting: Physician Assistant

## 2016-01-11 VITALS — BP 130/80 | HR 85 | Temp 98.0°F | Ht 68.0 in | Wt 268.0 lb

## 2016-01-11 DIAGNOSIS — I10 Essential (primary) hypertension: Secondary | ICD-10-CM

## 2016-01-11 DIAGNOSIS — E119 Type 2 diabetes mellitus without complications: Secondary | ICD-10-CM

## 2016-01-11 LAB — BASIC METABOLIC PANEL
BUN: 21 mg/dL (ref 6–23)
CALCIUM: 9.4 mg/dL (ref 8.4–10.5)
CO2: 26 meq/L (ref 19–32)
CREATININE: 1.02 mg/dL (ref 0.40–1.50)
Chloride: 101 mEq/L (ref 96–112)
GFR: 80.2 mL/min (ref >60.00)
GLUCOSE: 145 mg/dL — AB (ref 70–99)
Potassium: 3.5 mEq/L (ref 3.5–5.1)
SODIUM: 136 meq/L (ref 135–145)

## 2016-01-11 LAB — HEMOGLOBIN A1C: Hgb A1c MFr Bld: 7.3 % — ABNORMAL HIGH (ref 4.6–6.5)

## 2016-01-11 NOTE — Patient Instructions (Signed)
Please go to the lab for blood work. I will call you with your results. Please continue medications as directed.  We will alter your regimen based on results.  Stay active and follow that diet! Follow-up will be based on your results.  DASH Eating Plan DASH stands for "Dietary Approaches to Stop Hypertension." The DASH eating plan is a healthy eating plan that has been shown to reduce high blood pressure (hypertension). Additional health benefits may include reducing the risk of type 2 diabetes mellitus, heart disease, and stroke. The DASH eating plan may also help with weight loss. WHAT DO I NEED TO KNOW ABOUT THE DASH EATING PLAN? For the DASH eating plan, you will follow these general guidelines:  Choose foods with a percent daily value for sodium of less than 5% (as listed on the food label).  Use salt-free seasonings or herbs instead of table salt or sea salt.  Check with your health care provider or pharmacist before using salt substitutes.  Eat lower-sodium products, often labeled as "lower sodium" or "no salt added."  Eat fresh foods.  Eat more vegetables, fruits, and low-fat dairy products.  Choose whole grains. Look for the word "whole" as the first word in the ingredient list.  Choose fish and skinless chicken or [place] more often than red meat. Limit fish, poultry, and meat to 6 oz (170 g) each day.  Limit sweets, desserts, sugars, and sugary drinks.  Choose heart-healthy fats.  Limit cheese to 1 oz (28 g) per day.  Eat more home-cooked food and less restaurant, buffet, and fast food.  Limit fried foods.  Cook foods using methods other than frying.  Limit canned vegetables. If you do use them, rinse them well to decrease the sodium.  When eating at a restaurant, ask that your food be prepared with less salt, or no salt if possible. WHAT FOODS CAN I EAT? Seek help from a dietitian for individual calorie needs. Grains Whole grain or whole wheat bread. Brown  rice. Whole grain or whole wheat pasta. Quinoa, bulgur, and whole grain cereals. Low-sodium cereals. Corn or whole wheat flour tortillas. Whole grain cornbread. Whole grain crackers. Low-sodium crackers. Vegetables Fresh or frozen vegetables (raw, steamed, roasted, or grilled). Low-sodium or reduced-sodium tomato and vegetable juices. Low-sodium or reduced-sodium tomato sauce and paste. Low-sodium or reduced-sodium canned vegetables.  Fruits All fresh, canned (in natural juice), or frozen fruits. Meat and Other Protein Products Ground beef (85% or leaner), grass-fed beef, or beef trimmed of fat. Skinless chicken or [place]. Ground chicken or [place]. Pork trimmed of fat. All fish and seafood. Eggs. Dried beans, peas, or lentils. Unsalted nuts and seeds. Unsalted canned beans. Dairy Low-fat dairy products, such as skim or 1% milk, 2% or reduced-fat cheeses, low-fat ricotta or cottage cheese, or plain low-fat yogurt. Low-sodium or reduced-sodium cheeses. Fats and Oils Tub margarines without trans fats. Light or reduced-fat mayonnaise and salad dressings (reduced sodium). Avocado. Safflower, olive, or canola oils. Natural peanut or almond butter. Other Unsalted popcorn and pretzels. The items listed above may not be a complete list of recommended foods or beverages. Contact your dietitian for more options. WHAT FOODS ARE NOT RECOMMENDED? Grains White bread. White pasta. White rice. Refined cornbread. Bagels and croissants. Crackers that contain trans fat. Vegetables Creamed or fried vegetables. Vegetables in a cheese sauce. Regular canned vegetables. Regular canned tomato sauce and paste. Regular tomato and vegetable juices. Fruits Dried fruits. Canned fruit in light or heavy syrup. Fruit juice. Meat and Other Protein  Products Fatty cuts of meat. Ribs, chicken wings, bacon, sausage, bologna, salami, chitterlings, fatback, hot dogs, bratwurst, and packaged luncheon meats. Salted nuts and seeds.  Canned beans with salt. Dairy Whole or 2% milk, cream, half-and-half, and cream cheese. Whole-fat or sweetened yogurt. Full-fat cheeses or blue cheese. Nondairy creamers and whipped toppings. Processed cheese, cheese spreads, or cheese curds. Condiments Onion and garlic salt, seasoned salt, table salt, and sea salt. Canned and packaged gravies. Worcestershire sauce. Tartar sauce. Barbecue sauce. Teriyaki sauce. Soy sauce, including reduced sodium. Steak sauce. Fish sauce. Oyster sauce. Cocktail sauce. Horseradish. Ketchup and mustard. Meat flavorings and tenderizers. Bouillon cubes. Hot sauce. Tabasco sauce. Marinades. Taco seasonings. Relishes. Fats and Oils Butter, stick margarine, lard, shortening, ghee, and bacon fat. Coconut, palm kernel, or palm oils. Regular salad dressings. Other Pickles and olives. Salted popcorn and pretzels. The items listed above may not be a complete list of foods and beverages to avoid. Contact your dietitian for more information. WHERE CAN I FIND MORE INFORMATION? National Heart, Lung, and Blood Institute: travelstabloid.com   This information is not intended to replace advice given to you by your health care provider. Make sure you discuss any questions you have with your health care provider.   Document Released: 11/30/2011 Document Revised: 01/01/2015 Document Reviewed: 10/15/2013 Elsevier Interactive Patient Education [employer].

## 2016-01-11 NOTE — Assessment & Plan Note (Signed)
Will check BMP and A1C today. Continue diet and exercise regimen. Suspect mild elevations in fasting sugars recently secondary to mild viral infection that is resolving. Will continue to follow.

## 2016-01-11 NOTE — Progress Notes (Signed)
Patient presents to clinic today for follow-up of diabetes mellitus II without complication. Last A1C at 6.8 in 09/2015. Patient's diabetes currently controlled with diet and exercise regimen. Is up-to-date on immunizations, eye examination and foot examination.   Is on HCTZ for hypertension. BP has previously been well-controlled with this regimen. Patient has not taken medications this morning. Patient denies chest pain, palpitations, lightheadedness, dizziness, vision changes or frequent headaches.  BP Readings from Last 3 Encounters:  01/11/16 130/80  12/01/15 134/87  11/22/15 124/85    Past Medical History  Diagnosis Date  . Internal bleeding hemorrhoids 2012    "might bleed once/month; only when I strain"  . Hyperlipidemia   . Clostridium difficile infection 2014  . Testicular hypofunction   . Biceps tendon rupture     bilateral  . GERD (gastroesophageal reflux disease)   . Colon polyps   . Allergy   . Arthritis   . Hypertension   . Diabetes ([place])     Borderline/Pre-diabetes  . Leg cramps   . Internal prolapsed hemorrhoids     Current Outpatient Prescriptions on File Prior to Visit  Medication Sig Dispense Refill  . ALLERGIST [name] See admin instructions.  6  . Blood Glucose Monitoring Suppl (RELION CONFIRM GLUCOSE MONITOR) W/DEVICE KIT USE AS DIRECTED TO TEST BLOOD GLUCOSE DAILY DX: 250.00 1 kit 0  . Cholecalciferol (VITAMIN D-3) 5000 UNITS TABS Take 1 tablet by mouth daily.    Marland Kitchen [name] Take 25 mg by mouth daily.    Marland Kitchen docusate sodium (COLACE) 100 MG capsule Take 100 mg by mouth. 1 tablet in the morning and 2 tablets in the evening.    Marland Kitchen EPIPEN 2-PAK 0.3 MG/0.3ML SOAJ injection Inject 0.3 mg into the skin as needed (allergies).     . fenofibrate micronized (LOFIBRA) 134 MG capsule TAKE 1 CAPSULE (134 MG TOTAL) BY MOUTH DAILY BEFORE BREAKFAST. 90 capsule 1  . hydrochlorothiazide (HYDRODIURIL) 25 MG tablet Take 25 mg by  mouth daily.    Marland Kitchen LIVALO 4 MG TABS TAKE 1 TABLET (4 MG TOTAL) BY MOUTH DAILY. 30 tablet 5  . methocarbamol (ROBAXIN) 500 MG tablet Take 500 mg by mouth every 8 (eight) hours as needed for muscle spasms.    Marland Kitchen omeprazole (PRILOSEC) 20 MG capsule Take 1 capsule (20 mg total) by mouth daily. 90 capsule 1  . XARELTO 20 MG TABS tablet TAKE 1 TABLET BY MOUTH DAILY 30 tablet 2  . zolpidem (AMBIEN) 5 MG tablet Take 1 tablet (5 mg total) by mouth at bedtime as needed for sleep. 30 tablet 3  . [name] Take 1 tablet by mouth daily. Reported on 01/11/2016     No current facility-administered medications on file prior to visit.    Allergies  Allergen Reactions  . Tetanus Toxoids Anaphylaxis  . Daptomycin Other (See Comments)    myalgias  . Citrus Diarrhea    All citrus fruits  . Milk-Related Compounds Diarrhea    Milk and milk products  . Oxycodone   . Asa [Aspirin] Nausea Only and Rash  . Penicillins Rash  . Prednisone (Pak) [Prednisone] Other (See Comments)    Hyperactivity WITH ORAL, CAN TAKE SHOTS-Cannot do Dose pack    Family History  Problem Relation Age of Onset  . Heart disease Mother 36    Deceased  . Hyperlipidemia Mother   . Hypertension Mother   . Heart disease Father 33  Deceased  . CVA Father   . Hypertension Maternal Grandmother   . Hyperlipidemia Maternal Grandmother   . Cancer Maternal Grandmother     Colon Cancer  . Heart disease Maternal Grandfather   . Hypertension Maternal Grandfather   . Hyperlipidemia Maternal Grandfather   . Cancer Maternal Grandfather     Lung Cancer  . Heart attack Maternal Grandfather   . Heart disease Paternal Grandfather     Social History   Social History  . Marital Status: Married    Spouse Name: N/A  . Number of Children: N/A  . Years of Education: N/A   Social History Main Topics  . Smoking status: Never Smoker   . Smokeless tobacco: Never Used     Comment: NEVER USED TOBACCO  . Alcohol Use: No  . Drug  Use: No  . Sexual Activity: Not Currently   Other Topics Concern  . None   Social History Narrative   Marital Status: Married Pamala Hurry)    Children:  Step Daughter Sharyn Lull)    Pets: None    Living Situation: Lives with Pamala Hurry    Occupation: [employer]    Education: Associate's Degree in [employer]    Tobacco Use/Exposure:  None    Alcohol Use:  Rarely    Drug Use:  None   Diet:  Regular   Exercise: Limited     Hobbies:  Hunting, Fishing             Review of Systems - See HPI.  All other ROS are negative.  BP 130/80 mmHg  Pulse 85  Temp(Src) 98 F (36.7 C) (Oral)  Ht 5' 8"  (1.727 m)  Wt 268 lb (121.564 kg)  BMI 40.76 kg/m2  SpO2 96%  Physical Exam  Constitutional: He is oriented to person, place, and time and well-developed, well-nourished, and in no distress.  HENT:  Head: Normocephalic and atraumatic.  Cardiovascular: Normal rate, regular rhythm, normal heart sounds and intact distal pulses.   Pulmonary/Chest: Effort normal and breath sounds normal. No respiratory distress. He has no wheezes. He has no rales. He exhibits no tenderness.  Neurological: He is alert and oriented to person, place, and time.  Vitals reviewed.  Recent Results (from the past 2160 hour(s))  CBC with Differential [facility])     Status: Abnormal   Collection Time: 12/01/15 10:21 AM  Result Value Ref Range   WBC 5.0 4.0 - 10.0 10e3/uL   RBC 4.78 4.20 - 5.70 10e6/uL   HGB 16.3 13.0 - 17.1 g/dL   HCT 44.0 38.7 - 49.9 %   MCV 92 82 - 98 fL   MCH 34.1 (H) 28.0 - 33.4 pg   MCHC 37.0 (H) 32.0 - 35.9 g/dL   RDW 13.4 11.1 - 15.7 %   Platelets 127 (L) 145 - 400 10e3/uL   NEUT# 2.3 1.5 - 6.5 10e3/uL   LYMPH# 1.2 0.9 - 3.3 10e3/uL   MONO# 1.2 (H) 0.1 - 0.9 10e3/uL   Eosinophils Absolute 0.3 0.0 - 0.5 10e3/uL   BASO# 0.1 0.0 - 0.2 10e3/uL   NEUT% 46.0 40.0 - 80.0 %   LYMPH% 24.0 14.0 - 48.0 %   MONO% 23.0 (H) 0.0 - 13.0 %   EOS% 6.0 0.0 - 7.0 %   BASO% 1.0 0.0  - 2.0 %  Comprehensive metabolic panel     Status: Abnormal   Collection Time: 12/01/15 10:21 AM  Result Value Ref Range   Sodium 136 136 -  145 mEq/L   Potassium 3.8 3.5 - 5.1 mEq/L   Chloride 101 98 - 109 mEq/L   CO2 15 (L) 22 - 29 mEq/L   Glucose 163 (H) 70 - 140 mg/dl    Comment: Glucose reference range is for nonfasting patients. Fasting glucose reference range is 70- 100.   BUN 13.5 7.0 - 26.0 mg/dL   Creatinine 1.1 0.7 - 1.3 mg/dL   Total Bilirubin 0.62 0.20 - 1.20 mg/dL   Alkaline Phosphatase 63 40 - 150 U/L   AST 30 5 - 34 U/L   ALT 24 0 - 55 U/L   Total Protein 8.4 (H) 6.4 - 8.3 g/dL   Albumin 3.4 (L) 3.5 - 5.0 g/dL   Calcium 9.7 8.4 - 10.4 mg/dL   Anion Gap 20 (H) 3 - 11 mEq/L   EGFR 71 (L) >90 ml/min/1.73 m2    Comment: eGFR is calculated using the CKD-EPI Creatinine Equation (2009)    Assessment/Plan: Essential hypertension, benign Has not taken BP medication this morning due to rushing to appointment. Previously well-controlled. Repeat BP at 130/80. DASH diet again encouraged. Continue current medication regimen. Follow-up 6 months.  Type 2 diabetes mellitus without complication Will check BMP and A1C today. Continue diet and exercise regimen. Suspect mild elevations in fasting sugars recently secondary to mild viral infection that is resolving. Will continue to follow.

## 2016-01-11 NOTE — Assessment & Plan Note (Addendum)
Has not taken BP medication this morning due to rushing to appointment. Previously well-controlled. Repeat BP at 130/80. DASH diet again encouraged. Continue current medication regimen. Follow-up 6 months.

## 2016-01-11 NOTE — Progress Notes (Signed)
Pre visit review using our clinic review tool, if applicable. No additional management support is needed unless otherwise documented below in the visit note. 

## 2016-02-14 ENCOUNTER — Other Ambulatory Visit: Payer: Self-pay | Admitting: Physician Assistant

## 2016-04-11 ENCOUNTER — Encounter: Payer: Self-pay | Admitting: Physician Assistant

## 2016-04-11 ENCOUNTER — Ambulatory Visit (INDEPENDENT_AMBULATORY_CARE_PROVIDER_SITE_OTHER): Payer: BLUE CROSS/BLUE SHIELD | Admitting: Physician Assistant

## 2016-04-11 ENCOUNTER — Other Ambulatory Visit: Payer: Self-pay

## 2016-04-11 VITALS — BP 140/80 | HR 87 | Temp 98.3°F | Ht 68.0 in | Wt 270.6 lb

## 2016-04-11 DIAGNOSIS — K5900 Constipation, unspecified: Secondary | ICD-10-CM | POA: Diagnosis not present

## 2016-04-11 DIAGNOSIS — E119 Type 2 diabetes mellitus without complications: Secondary | ICD-10-CM | POA: Diagnosis not present

## 2016-04-11 DIAGNOSIS — R109 Unspecified abdominal pain: Secondary | ICD-10-CM

## 2016-04-11 DIAGNOSIS — I2601 Septic pulmonary embolism with acute cor pulmonale: Secondary | ICD-10-CM

## 2016-04-11 DIAGNOSIS — I2782 Chronic pulmonary embolism: Secondary | ICD-10-CM

## 2016-04-11 LAB — COMPREHENSIVE METABOLIC PANEL
ALBUMIN: 3.9 g/dL (ref 3.5–5.2)
ALK PHOS: 50 U/L (ref 39–117)
ALT: 26 U/L (ref 0–53)
AST: 33 U/L (ref 0–37)
BUN: 17 mg/dL (ref 6–23)
CO2: 28 mEq/L (ref 19–32)
Calcium: 9.9 mg/dL (ref 8.4–10.5)
Chloride: 101 mEq/L (ref 96–112)
Creatinine, Ser: 1.02 mg/dL (ref 0.40–1.50)
GFR: 80.13 mL/min (ref >60.00)
Glucose, Bld: 145 mg/dL — ABNORMAL HIGH (ref 70–99)
Potassium: 3.5 mEq/L (ref 3.5–5.1)
SODIUM: 136 meq/L (ref 135–145)
TOTAL PROTEIN: 6.7 g/dL (ref 6.0–8.3)
Total Bilirubin: 0.8 mg/dL (ref 0.2–1.2)

## 2016-04-11 LAB — LIPID PANEL
Cholesterol: 289 mg/dL — ABNORMAL HIGH (ref 0–200)
HDL: 21.6 mg/dL — ABNORMAL LOW (ref >39.00)
Total CHOL/HDL Ratio: 13
Triglycerides: 1702 mg/dL — ABNORMAL HIGH (ref 0.0–149.0)

## 2016-04-11 LAB — CBC WITH DIFFERENTIAL/PLATELET
Basophils Absolute: 0.1 10*3/uL (ref 0.0–0.1)
Basophils Relative: 1 % (ref 0.0–3.0)
EOS ABS: 0.3 10*3/uL (ref 0.0–0.7)
EOS PCT: 5.6 % — AB (ref 0.0–5.0)
HEMATOCRIT: 46.4 % (ref 39.0–52.0)
HEMOGLOBIN: 16.4 g/dL (ref 13.0–17.0)
LYMPHS PCT: 28.5 % (ref 12.0–46.0)
Lymphs Abs: 1.7 10*3/uL (ref 0.7–4.0)
MCHC: 35.2 g/dL (ref 30.0–36.0)
MCV: 94.5 fl (ref 78.0–100.0)
MONO ABS: 0.6 10*3/uL (ref 0.1–1.0)
Monocytes Relative: 10.7 % (ref 3.0–12.0)
Neutro Abs: 3.1 10*3/uL (ref 1.4–7.7)
Neutrophils Relative %: 54.2 % (ref 43.0–77.0)
Platelets: 144 10*3/uL — ABNORMAL LOW (ref 150.0–400.0)
RBC: 4.91 Mil/uL (ref 4.22–5.81)
RDW: 13.5 % (ref 11.5–15.5)
WBC: 5.8 10*3/uL (ref 4.0–10.5)

## 2016-04-11 LAB — URINALYSIS, ROUTINE W REFLEX MICROSCOPIC
BILIRUBIN URINE: NEGATIVE
Hgb urine dipstick: NEGATIVE
Ketones, ur: NEGATIVE
LEUKOCYTES UA: NEGATIVE
NITRITE: NEGATIVE
RBC / HPF: NONE SEEN (ref 0–?)
Specific Gravity, Urine: 1.02 (ref 1.000–1.030)
Total Protein, Urine: NEGATIVE
Urine Glucose: 250 — AB
Urobilinogen, UA: 0.2 (ref 0.0–1.0)
pH: 6 (ref 5.0–8.0)

## 2016-04-11 LAB — MICROALBUMIN / CREATININE URINE RATIO
CREATININE, U: 176.6 mg/dL
MICROALB UR: 0.7 mg/dL (ref 0.0–1.9)
Microalb Creat Ratio: 0.4 mg/g (ref 0.0–30.0)

## 2016-04-11 LAB — HEMOGLOBIN A1C: HEMOGLOBIN A1C: 7.9 % — AB (ref 4.6–6.5)

## 2016-04-11 LAB — LDL CHOLESTEROL, DIRECT: LDL DIRECT: 36 mg/dL

## 2016-04-11 MED ORDER — METHOCARBAMOL 500 MG PO TABS: 500.0000 mg | ORAL_TABLET | Freq: Three times a day (TID) | ORAL | Status: DC | PRN

## 2016-04-11 NOTE — Progress Notes (Signed)
History of Present Illness: Patient is a 55 y.o. male who presents to clinic today for follow-up of Diabetes Mellitus II, previously controlled.  P.  Endorses taking medications as directed. Endorses sugars have been doing well overall.  Denies polyuria, polydipsia or urinary frequency. Is checking blood glucose as directed. Averaging 100-130 overall but has noticed fasting sugars averaging 180-200 over the past couple of days despite no change in medications or diet. 100-194  Patient also endorses intermittent episodes over the past 2 months where after dinner, he notes feeling nauseated with abdominal cramping and mild headache. Denies tenesmus, melena or hematochezia. Denies diarrhea. Endorses constipation with 1 BM every 3-4 days. Stools are hard per patient.   Latest Maintenance: A1C -- Due for repeat giving increased  Lab Results  Component Value Date   HGBA1C 7.3* 01/11/2016   Diabetic Eye Exam -- 09/09/15 - no retinopathy Urine Microalbumin -- 10/21/2015 Foot Exam -- 10/11/2015 - no concerning findings at that time.   Past Medical History  Diagnosis Date  . Internal bleeding hemorrhoids 2012    "might bleed once/month; only when I strain"  . Hyperlipidemia   . Clostridium difficile infection 2014  . Testicular hypofunction   . Biceps tendon rupture     bilateral  . GERD (gastroesophageal reflux disease)   . Colon polyps   . Allergy   . Arthritis   . Hypertension   . Diabetes ([place])     Borderline/Pre-diabetes  . Leg cramps   . Internal prolapsed hemorrhoids     Current Outpatient Prescriptions on File Prior to Visit  Medication Sig Dispense Refill  . ALLERGIST [name] See admin instructions.  6  . Blood Glucose Monitoring Suppl (RELION CONFIRM GLUCOSE MONITOR) W/DEVICE KIT USE AS DIRECTED TO TEST BLOOD GLUCOSE DAILY DX: 250.00 1 kit 0  . Cholecalciferol (VITAMIN D-3) 5000 UNITS TABS Take 1 tablet by mouth daily.    Marland Kitchen CLOMIPHENE  CITRATE PO Take 25 mg by mouth daily.    Marland Kitchen docusate sodium (COLACE) 100 MG capsule Take 100 mg by mouth. 1 tablet in the morning and 2 tablets in the evening.    Marland Kitchen EPIPEN 2-PAK 0.3 MG/0.3ML SOAJ injection Inject 0.3 mg into the skin as needed (allergies).     . fenofibrate micronized (LOFIBRA) 134 MG capsule TAKE 1 CAPSULE (134 MG TOTAL) BY MOUTH DAILY BEFORE BREAKFAST. 90 capsule 1  . hydrochlorothiazide (HYDRODIURIL) 25 MG tablet Take 25 mg by mouth daily.    Marland Kitchen LIVALO 4 MG TABS TAKE 1 TABLET (4 MG TOTAL) BY MOUTH DAILY. 30 tablet 5  . methocarbamol (ROBAXIN) 500 MG tablet Take 500 mg by mouth every 8 (eight) hours as needed for muscle spasms.    Marland Kitchen omeprazole (PRILOSEC) 20 MG capsule Take 1 capsule (20 mg total) by mouth daily. 90 capsule 1  . [name] Take 1 tablet by mouth daily. Reported on 01/11/2016    . XARELTO 20 MG TABS tablet TAKE 1 TABLET BY MOUTH DAILY 30 tablet 2  . zolpidem (AMBIEN) 5 MG tablet Take 1 tablet (5 mg total) by mouth at bedtime as needed for sleep. 30 tablet 3   No current facility-administered medications on file prior to visit.    Allergies  Allergen Reactions  . Tetanus Toxoids Anaphylaxis  . Daptomycin Other (See Comments)    myalgias  . Citrus Diarrhea    All citrus fruits  . Milk-Related Compounds Diarrhea  Milk and milk products  . Oxycodone   . Asa [Aspirin] Nausea Only and Rash  . Penicillins Rash  . Prednisone (Pak) [Prednisone] Other (See Comments)    Hyperactivity WITH ORAL, CAN TAKE SHOTS-Cannot do Dose pack    Family History  Problem Relation Age of Onset  . Heart disease Mother 56    Deceased  . Hyperlipidemia Mother   . Hypertension Mother   . Heart disease Father 28    Deceased  . CVA Father   . Hypertension Maternal Grandmother   . Hyperlipidemia Maternal Grandmother   . Cancer Maternal Grandmother     Colon Cancer  . Heart disease Maternal Grandfather   . Hypertension Maternal Grandfather   . Hyperlipidemia  Maternal Grandfather   . Cancer Maternal Grandfather     Lung Cancer  . Heart attack Maternal Grandfather   . Heart disease Paternal Grandfather     Social History   Social History  . Marital Status: Married    Spouse Name: N/A  . Number of Children: N/A  . Years of Education: N/A   Social History Main Topics  . Smoking status: Never Smoker   . Smokeless tobacco: Never Used     Comment: NEVER USED TOBACCO  . Alcohol Use: No  . Drug Use: No  . Sexual Activity: Not Currently   Other Topics Concern  . None   Social History Narrative   Marital Status: Married Environmental consultant)    Children:  Step Daughter Sharyn Lull)    Pets: None    Living Situation: Lives with Pamala Hurry    Occupation: [employer] ([place])    Education: Associate's Degree in [employer]    Tobacco Use/Exposure:  None    Alcohol Use:  Rarely    Drug Use:  None   Diet:  Regular   Exercise: Limited     Hobbies:  Hunting, Fishing             Review of Systems: Pertinent ROS are listed in HPI  Physical Examination: BP 140/80 mmHg  Pulse 87  Temp(Src) 98.3 F (36.8 C) (Oral)  Ht _0  (1.727 m)  Wt 270 lb 9.6 oz (122.743 kg)  BMI 41.15 kg/m2  SpO2 97% General appearance: alert, cooperative, appears stated age and no distress Head: Normocephalic, without obvious abnormality, atraumatic Eyes: conjunctivae/corneas clear. PERRL, EOM's intact. Fundi benign. Ears: normal TM's and external ear canals both ears Nose: Nares normal. Septum midline. Mucosa normal. No drainage or sinus tenderness. Throat: lips, mucosa, and tongue normal; teeth and gums normal Lungs: clear to auscultation bilaterally Heart: regular rate and rhythm, S1, S2 normal, no murmur, click, rub or gallop Abdomen: soft, non-tender; bowel sounds normal; no masses,  no organomegaly Extremities: extremities normal, atraumatic, no cyanosis or edema Neurologic: Alert and oriented X 3, normal strength and tone. Normal symmetric reflexes.  Normal coordination and gait  Assessment/Plan: 1. Type 2 diabetes mellitus without complication, without long-term current use of insulin ([place]) Will update labs today. Discussed potential need to restart medication. Cannot tolerate Metformin. Will consider addition of Invokana if labs look good. Voucher for free trial given to use once medication is sent in. - Comprehensive metabolic panel - Hemoglobin A1c - Lipid panel - Urinalysis, Routine w reflex microscopic (not at [facility]) - Microalbumin / creatinine urine ratio  2. Constipation, unspecified constipation type Reviewed bowel regimen. Will check labs today. - CBC with Differential/Platelet - Comprehensive metabolic panel - Urinalysis, Routine w reflex microscopic (not at [facility])  3. Abdominal discomfort Mild. Suspect due to constipation but need assessment of gallbladder and liver function. Begin daily probiotic. Will treat based on labs. Follow-up scheduled.  - CBC with Differential/Platelet - Comprehensive metabolic panel - Urinalysis, Routine w reflex microscopic (not at [facility])

## 2016-04-11 NOTE — Progress Notes (Signed)
Pre visit review using our clinic tool,if applicable. No additional management support is needed unless otherwise documented below in the visit note.  

## 2016-04-11 NOTE — Patient Instructions (Signed)
Please go to the lab for blood work. I will call you with your results.  Stay hydrated and follow the bowel regimen below.  I encourage you to increase hydration and the amount of fiber in your diet.  Start a daily probiotic (Align, Culturelle, Digestive Advantage, etc.). If no bowel movement within 24 hours, take 2 Tbs of Milk of Magnesia in a 4 oz glass of warmed prune juice every 2-3 days to help promote bowel movement. If no results within 24 hours, then repeat above regimen, adding a Dulcolax stool softener to regimen. If this does not promote a bowel movement, please call the office.  If renal function and urine look good, I will send in a low dose of Invokana to start daily.  Please use the voucher given for a 30-day free trial.  Follow-up 1 week.

## 2016-04-11 NOTE — Progress Notes (Deleted)
Patient presents to clinic today c/o ***.   Past Medical History  Diagnosis Date  . Internal bleeding hemorrhoids 2012    "might bleed once/month; only when I strain"  . Hyperlipidemia   . Clostridium difficile infection 2014  . Testicular hypofunction   . Biceps tendon rupture     bilateral  . GERD (gastroesophageal reflux disease)   . Colon polyps   . Allergy   . Arthritis   . Hypertension   . Diabetes ([place])     Borderline/Pre-diabetes  . Leg cramps   . Internal prolapsed hemorrhoids     Current Outpatient Prescriptions on File Prior to Visit  Medication Sig Dispense Refill  . ALLERGIST [name] See admin instructions.  6  . Blood Glucose Monitoring Suppl (RELION CONFIRM GLUCOSE MONITOR) W/DEVICE KIT USE AS DIRECTED TO TEST BLOOD GLUCOSE DAILY DX: 250.00 1 kit 0  . Cholecalciferol (VITAMIN D-3) 5000 UNITS TABS Take 1 tablet by mouth daily.    Marland Kitchen [name] Take 25 mg by mouth daily.    Marland Kitchen docusate sodium (COLACE) 100 MG capsule Take 100 mg by mouth. 1 tablet in the morning and 2 tablets in the evening.    Marland Kitchen EPIPEN 2-PAK 0.3 MG/0.3ML SOAJ injection Inject 0.3 mg into the skin as needed (allergies).     . fenofibrate micronized (LOFIBRA) 134 MG capsule TAKE 1 CAPSULE (134 MG TOTAL) BY MOUTH DAILY BEFORE BREAKFAST. 90 capsule 1  . hydrochlorothiazide (HYDRODIURIL) 25 MG tablet Take 25 mg by mouth daily.    Marland Kitchen LIVALO 4 MG TABS TAKE 1 TABLET (4 MG TOTAL) BY MOUTH DAILY. 30 tablet 5  . methocarbamol (ROBAXIN) 500 MG tablet Take 500 mg by mouth every 8 (eight) hours as needed for muscle spasms.    Marland Kitchen omeprazole (PRILOSEC) 20 MG capsule Take 1 capsule (20 mg total) by mouth daily. 90 capsule 1  . [name] Take 1 tablet by mouth daily. Reported on 01/11/2016    . XARELTO 20 MG TABS tablet TAKE 1 TABLET BY MOUTH DAILY 30 tablet 2  . zolpidem (AMBIEN) 5 MG tablet Take 1 tablet (5 mg total) by mouth at bedtime as needed for sleep. 30  tablet 3   No current facility-administered medications on file prior to visit.    Allergies  Allergen Reactions  . Tetanus Toxoids Anaphylaxis  . Daptomycin Other (See Comments)    myalgias  . Citrus Diarrhea    All citrus fruits  . Milk-Related Compounds Diarrhea    Milk and milk products  . Oxycodone   . Asa [Aspirin] Nausea Only and Rash  . Penicillins Rash  . Prednisone (Pak) [Prednisone] Other (See Comments)    Hyperactivity WITH ORAL, CAN TAKE SHOTS-Cannot do Dose pack    Family History  Problem Relation Age of Onset  . Heart disease Mother 71    Deceased  . Hyperlipidemia Mother   . Hypertension Mother   . Heart disease Father 75    Deceased  . CVA Father   . Hypertension Maternal Grandmother   . Hyperlipidemia Maternal Grandmother   . Cancer Maternal Grandmother     Colon Cancer  . Heart disease Maternal Grandfather   . Hypertension Maternal Grandfather   . Hyperlipidemia Maternal Grandfather   . Cancer Maternal Grandfather     Lung Cancer  . Heart attack Maternal Grandfather   . Heart disease Paternal Grandfather     Social History  Social History  . Marital Status: Married    Spouse Name: N/A  . Number of Children: N/A  . Years of Education: N/A   Social History Main Topics  . Smoking status: Never Smoker   . Smokeless tobacco: Never Used     Comment: NEVER USED TOBACCO  . Alcohol Use: No  . Drug Use: No  . Sexual Activity: Not Currently   Other Topics Concern  . None   Social History Narrative   Marital Status: Married Pamala Hurry)    Children:  Step Daughter Sharyn Lull)    Pets: None    Living Situation: Lives with Pamala Hurry    Occupation: [employer] ([place])    Education: Associate's Degree in [employer]    Tobacco Use/Exposure:  None    Alcohol Use:  Rarely    Drug Use:  None   Diet:  Regular   Exercise: Limited     Hobbies:  Hunting, Fishing              Review of Systems - See HPI.  All other ROS are  negative.  BP 140/80 mmHg  Pulse 87  Temp(Src) 98.3 F (36.8 C) (Oral)  Ht 5' 8"  (1.727 m)  Wt 270 lb 9.6 oz (122.743 kg)  BMI 41.15 kg/m2  SpO2 97%  Physical Exam  No results found for this or any previous visit (from the past 2160 hour(s)).  Assessment/Plan: No problem-specific assessment & plan notes found for this encounter.

## 2016-04-12 ENCOUNTER — Telehealth: Payer: Self-pay | Admitting: *Deleted

## 2016-04-12 DIAGNOSIS — E119 Type 2 diabetes mellitus without complications: Secondary | ICD-10-CM

## 2016-04-12 MED ORDER — CANAGLIFLOZIN 100 MG PO TABS: 100.0000 mg | ORAL_TABLET | Freq: Every day | ORAL | Status: DC

## 2016-04-12 NOTE — Telephone Encounter (Signed)
Called and spoke with the pt and informed him of recent lab results and note.  Pt verbalized understanding.  Pt agreed to start the Invokana 100mg  daily, and a new prescription was sent to the pharmacy.  Pt stated that he can not go to the Lipid clinic because of money reasons,but he agreed to take the [place].  Pt also wanted to know when should he come back for a follow-up.//AB/CMA

## 2016-04-12 NOTE — Telephone Encounter (Signed)
-----   Message from Brunetta Jeans, PA-C sent at 04/12/2016 12:30 PM EDT ----- A1C has risen to 7.9. Please send in Rx invokana 100 mg for him to use daily. I gave him voucher at appointment. TGL are through the rough, partially from elevated sugars. Make sure he is taking Livalo and fenofibrate as directed. Since he cannot tolerate higher doses of fenofibrate or other statin medications, we need to add on another medication (Vascepa) and get him in with the lipid clinic. I will order med and place referral if he is willing. If he has any epigastric pain or severe nausea/vomiting, I want him to go to the ER as he is at higher risk of pancreatitis giving high TGL levels.

## 2016-04-14 MED ORDER — ICOSAPENT ETHYL 1 G PO CAPS: 2.0000 g | ORAL_CAPSULE | Freq: Two times a day (BID) | ORAL | Status: DC

## 2016-04-14 NOTE — Telephone Encounter (Signed)
Vascepa sent in. FU 3-4 weeks.

## 2016-04-14 NOTE — Telephone Encounter (Signed)
Called and spoke with the pt and informed him of the note below.  Pt verbalized understanding and agreed and stated that he will call back on Monday to schedule the follow-up appt.//AB/CMA

## 2016-04-17 ENCOUNTER — Other Ambulatory Visit: Payer: Self-pay | Admitting: Physician Assistant

## 2016-04-24 ENCOUNTER — Encounter: Payer: Self-pay | Admitting: Physician Assistant

## 2016-04-24 ENCOUNTER — Ambulatory Visit (INDEPENDENT_AMBULATORY_CARE_PROVIDER_SITE_OTHER): Payer: BLUE CROSS/BLUE SHIELD | Admitting: Physician Assistant

## 2016-04-24 VITALS — BP 118/88 | HR 79 | Temp 98.0°F | Resp 16 | Ht 68.0 in | Wt 263.2 lb

## 2016-04-24 DIAGNOSIS — L609 Nail disorder, unspecified: Secondary | ICD-10-CM

## 2016-04-24 MED ORDER — MUPIROCIN CALCIUM 2 % EX CREA: 1.0000 "application " | TOPICAL_CREAM | Freq: Two times a day (BID) | CUTANEOUS | Status: DC

## 2016-04-24 NOTE — Progress Notes (Signed)
Pre visit review using our clinic review tool, if applicable. No additional management support is needed unless otherwise documented below in the visit note/SLS  

## 2016-04-24 NOTE — Patient Instructions (Addendum)
Please keep skin and dry.  Wear supportive foot wear. Do a hydrogen peroxide soak for 10 minutes -- 1-2 x day. Apply mupirocin as directed. Follow-up if symptoms are not continuing to improve.

## 2016-04-24 NOTE — Progress Notes (Signed)
Patient presents to clinic today c/o pain in R great toe, lateral after chipping the nail 2 days ago. Denies pus or redness. Endorses some bleeding at time of break. Nothing since that time. Patient with history of diabetes and wanted to make sure to get his foot checked out. Denies swelling, decreased ROM, numbness or tingling. Tenderness is much improved today.   Past Medical History  Diagnosis Date  . Internal bleeding hemorrhoids 2012    "might bleed once/month; only when I strain"  . Hyperlipidemia   . Clostridium difficile infection 2014  . Testicular hypofunction   . Biceps tendon rupture     bilateral  . GERD (gastroesophageal reflux disease)   . Colon polyps   . Allergy   . Arthritis   . Hypertension   . Diabetes ([place])     Borderline/Pre-diabetes  . Leg cramps   . Internal prolapsed hemorrhoids     Current Outpatient Prescriptions on File Prior to Visit  Medication Sig Dispense Refill  . ALLERGIST [name] See admin instructions.  6  . Blood Glucose Monitoring Suppl (RELION CONFIRM GLUCOSE MONITOR) W/DEVICE KIT USE AS DIRECTED TO TEST BLOOD GLUCOSE DAILY DX: 250.00 1 kit 0  . canagliflozin (INVOKANA) 100 MG TABS tablet Take 1 tablet (100 mg total) by mouth daily before breakfast. 30 tablet 1  . Cholecalciferol (VITAMIN D-3) 5000 UNITS TABS Take 1 tablet by mouth daily.    Marland Kitchen [name] Take 25 mg by mouth daily.    Marland Kitchen docusate sodium (COLACE) 100 MG capsule Take 100 mg by mouth. 1 tablet in the morning and 2 tablets in the evening.    Marland Kitchen EPIPEN 2-PAK 0.3 MG/0.3ML SOAJ injection Inject 0.3 mg into the skin as needed (allergies).     . fenofibrate micronized (LOFIBRA) 134 MG capsule TAKE 1 CAPSULE (134 MG TOTAL) BY MOUTH DAILY BEFORE BREAKFAST. 90 capsule 1  . hydrochlorothiazide (HYDRODIURIL) 25 MG tablet Take 25 mg by mouth daily.    Vanessa Kick Ethyl 1 g CAPS Take 2 g by mouth 2 (two) times daily. 120 capsule 3  . LIVALO 4 MG  TABS TAKE 1 TABLET (4 MG TOTAL) BY MOUTH DAILY. 30 tablet 5  . methocarbamol (ROBAXIN) 500 MG tablet Take 1 tablet (500 mg total) by mouth every 8 (eight) hours as needed for muscle spasms. 90 tablet 1  . omeprazole (PRILOSEC) 20 MG capsule Take 1 capsule (20 mg total) by mouth daily. 90 capsule 1  . [name] Take 1 tablet by mouth daily. Reported on 01/11/2016    . XARELTO 20 MG TABS tablet TAKE 1 TABLET BY MOUTH DAILY 30 tablet 2  . zolpidem (AMBIEN) 5 MG tablet Take 1 tablet (5 mg total) by mouth at bedtime as needed for sleep. 30 tablet 3   No current facility-administered medications on file prior to visit.    Allergies  Allergen Reactions  . Tetanus Toxoids Anaphylaxis  . Daptomycin Other (See Comments)    myalgias  . Citrus Diarrhea    All citrus fruits  . Milk-Related Compounds Diarrhea    Milk and milk products  . Oxycodone   . Asa [Aspirin] Nausea Only and Rash  . Penicillins Rash  . Prednisone (Pak) [Prednisone] Other (See Comments)    Hyperactivity WITH ORAL, CAN TAKE SHOTS-Cannot do Dose pack    Family History  Problem Relation Age of Onset  . Heart disease Mother 46  Deceased  . Hyperlipidemia Mother   . Hypertension Mother   . Heart disease Father 24    Deceased  . CVA Father   . Hypertension Maternal Grandmother   . Hyperlipidemia Maternal Grandmother   . Cancer Maternal Grandmother     Colon Cancer  . Heart disease Maternal Grandfather   . Hypertension Maternal Grandfather   . Hyperlipidemia Maternal Grandfather   . Cancer Maternal Grandfather     Lung Cancer  . Heart attack Maternal Grandfather   . Heart disease Paternal Grandfather     Social History   Social History  . Marital Status: Married    Spouse Name: N/A  . Number of Children: N/A  . Years of Education: N/A   Social History Main Topics  . Smoking status: Never Smoker   . Smokeless tobacco: Never Used     Comment: NEVER USED TOBACCO  . Alcohol Use: No  . Drug Use: No   . Sexual Activity: Not Currently   Other Topics Concern  . None   Social History Narrative   Marital Status: Married Pamala Hurry)    Children:  Step Daughter Sharyn Lull)    Pets: None    Living Situation: Lives with Pamala Hurry    Occupation: [place] ([place])    Education: Associate's Degree in [employer]    Tobacco Use/Exposure:  None    Alcohol Use:  Rarely    Drug Use:  None   Diet:  Regular   Exercise: Limited     Hobbies:  Hunting, Fishing             Review of Systems - See HPI.  All other ROS are negative.  BP 122/90 mmHg  Pulse 79  Temp(Src) 98 F (36.7 C) (Oral)  Resp 16  Ht 5' 8"  (1.727 m)  Wt 263 lb 4 oz (119.409 kg)  BMI 40.04 kg/m2  SpO2 97%  Physical Exam  Constitutional: He is oriented to person, place, and time and well-developed, well-nourished, and in no distress.  HENT:  Head: Normocephalic and atraumatic.  Eyes: Conjunctivae are normal.  Cardiovascular: Normal rate, regular rhythm, normal heart sounds and intact distal pulses.   Pulmonary/Chest: Effort normal.  Neurological: He is alert and oriented to person, place, and time.  Skin:     Psychiatric: Affect normal.  Vitals reviewed.   Recent Results (from the past 2160 hour(s))  CBC with Differential/Platelet     Status: Abnormal   Collection Time: 04/11/16 10:02 AM  Result Value Ref Range   WBC 5.8 4.0 - 10.5 K/uL   RBC 4.91 4.22 - 5.81 Mil/uL   Hemoglobin 16.4 13.0 - 17.0 g/dL   HCT 46.4 39.0 - 52.0 %   MCV 94.5 78.0 - 100.0 fl   MCHC 35.2 30.0 - 36.0 g/dL   RDW 13.5 11.5 - 15.5 %   Platelets 144.0 (L) 150.0 - 400.0 K/uL   Neutrophils Relative % 54.2 43.0 - 77.0 %   Lymphocytes Relative 28.5 12.0 - 46.0 %   Monocytes Relative 10.7 3.0 - 12.0 %   Eosinophils Relative 5.6 (H) 0.0 - 5.0 %   Basophils Relative 1.0 0.0 - 3.0 %   Neutro Abs 3.1 1.4 - 7.7 K/uL   Lymphs Abs 1.7 0.7 - 4.0 K/uL   Monocytes Absolute 0.6 0.1 - 1.0 K/uL   Eosinophils Absolute 0.3 0.0 - 0.7 K/uL     Basophils Absolute 0.1 0.0 - 0.1 K/uL  Comprehensive metabolic panel     Status:  Abnormal   Collection Time: 04/11/16 10:02 AM  Result Value Ref Range   Sodium 136 135 - 145 mEq/L   Potassium 3.5 3.5 - 5.1 mEq/L   Chloride 101 96 - 112 mEq/L   CO2 28 19 - 32 mEq/L   Glucose, Bld 145 (H) 70 - 99 mg/dL   BUN 17 6 - 23 mg/dL   Creatinine, Ser 1.02 0.40 - 1.50 mg/dL   Total Bilirubin 0.8 0.2 - 1.2 mg/dL   Alkaline Phosphatase 50 39 - 117 U/L   AST 33 0 - 37 U/L   ALT 26 0 - 53 U/L   Total Protein 6.7 6.0 - 8.3 g/dL   Albumin 3.9 3.5 - 5.2 g/dL   Calcium 9.9 8.4 - 10.5 mg/dL   GFR 80.13 >60.00 mL/min  Hemoglobin A1c     Status: Abnormal   Collection Time: 04/11/16 10:02 AM  Result Value Ref Range   Hgb A1c MFr Bld 7.9 (H) 4.6 - 6.5 %    Comment: Glycemic Control Guidelines for People with Diabetes:Non Diabetic:  <6%Goal of Therapy: <7%Additional Action Suggested:  >8%   Lipid panel     Status: Abnormal   Collection Time: 04/11/16 10:02 AM  Result Value Ref Range   Cholesterol 289 (H) 0 - 200 mg/dL    Comment: ATP III Classification       Desirable:  < 200 mg/dL               Borderline High:  200 - 239 mg/dL          High:  > = 240 mg/dL   Triglycerides (H) 0.0 - 149.0 mg/dL    1702.0 Triglyceride is over 400; calculations on Lipids are invalid.    Comment: Normal:  <150 mg/dLBorderline High:  150 - 199 mg/dL   HDL 21.60 (L) >39.00 mg/dL   Total CHOL/HDL Ratio 13     Comment:                Men          Women1/2 Average Risk     3.4          3.3Average Risk          5.0          4.42X Average Risk          9.6          7.13X Average Risk          15.0          11.0                      Urinalysis, Routine w reflex microscopic (not at [facility])     Status: Abnormal   Collection Time: 04/11/16 10:02 AM  Result Value Ref Range   Color, Urine YELLOW Yellow;Lt. Yellow   APPearance CLEAR Clear   Specific Gravity, Urine 1.020 1.000-1.030   pH 6.0 5.0 - 8.0   Total Protein, Urine NEGATIVE  Negative   Urine Glucose 250 (A) Negative   Ketones, ur NEGATIVE Negative   Bilirubin Urine NEGATIVE Negative   Hgb urine dipstick NEGATIVE Negative   Urobilinogen, UA 0.2 0.0 - 1.0   Leukocytes, UA NEGATIVE Negative   Nitrite NEGATIVE Negative   WBC, UA 0-2/hpf 0-2/hpf   RBC / HPF none seen 0-2/hpf   Squamous Epithelial / LPF Rare(0-4/hpf) Rare(0-4/hpf)  Microalbumin / creatinine urine ratio     Status: None   Collection  Time: 04/11/16 10:02 AM  Result Value Ref Range   Microalb, Ur 0.7 0.0 - 1.9 mg/dL   Creatinine,U 176.6 mg/dL   Microalb Creat Ratio 0.4 0.0 - 30.0 mg/g  LDL cholesterol, direct     Status: None   Collection Time: 04/11/16 10:02 AM  Result Value Ref Range   Direct LDL 36.0 mg/dL    Comment: Optimal:  <100 mg/dLNear or Above Optimal:  100-129 mg/dLBorderline High:  130-159 mg/dLHigh:  160-189 mg/dLVery High:  >190 mg/dL   Assessment/Plan: 1. Nail abnormality In diabetic. No active infection noted. Discussed proper nail care. Peroxide soaks recommended. Rx Bactroban to apply twice daily to help prevent infection. FU PRN if symptoms are not continuing to resolve. - mupirocin cream (BACTROBAN) 2 %; Apply 1 application topically 2 (two) times daily.  Dispense: 15 g; Refill: 0

## 2016-05-01 ENCOUNTER — Ambulatory Visit (HOSPITAL_BASED_OUTPATIENT_CLINIC_OR_DEPARTMENT_OTHER)
Admission: RE | Admit: 2016-05-01 | Discharge: 2016-05-01 | Disposition: A | Discharge status: Home / Self Care | Payer: BLUE CROSS/BLUE SHIELD | Source: Ambulatory Visit | Attending: Family | Admitting: Family

## 2016-05-01 ENCOUNTER — Ambulatory Visit (HOSPITAL_BASED_OUTPATIENT_CLINIC_OR_DEPARTMENT_OTHER): Payer: BLUE CROSS/BLUE SHIELD | Admitting: Family

## 2016-05-01 ENCOUNTER — Other Ambulatory Visit (HOSPITAL_BASED_OUTPATIENT_CLINIC_OR_DEPARTMENT_OTHER): Payer: BLUE CROSS/BLUE SHIELD

## 2016-05-01 ENCOUNTER — Encounter: Payer: Self-pay | Admitting: Family

## 2016-05-01 VITALS — BP 149/84 | HR 70 | Temp 97.4°F | Resp 16 | Ht 68.0 in | Wt 267.0 lb

## 2016-05-01 DIAGNOSIS — I82512 Chronic embolism and thrombosis of left femoral vein: Secondary | ICD-10-CM | POA: Diagnosis not present

## 2016-05-01 DIAGNOSIS — I824Z2 Acute embolism and thrombosis of unspecified deep veins of left distal lower extremity: Secondary | ICD-10-CM | POA: Diagnosis present

## 2016-05-01 DIAGNOSIS — I2699 Other pulmonary embolism without acute cor pulmonale: Secondary | ICD-10-CM

## 2016-05-01 DIAGNOSIS — Z86711 Personal history of pulmonary embolism: Secondary | ICD-10-CM | POA: Diagnosis not present

## 2016-05-01 LAB — CBC WITH DIFFERENTIAL (CANCER CENTER ONLY)
BASO#: 0 10*3/uL (ref 0.0–0.2)
BASO%: 0.7 % (ref 0.0–2.0)
EOS ABS: 0.5 10*3/uL (ref 0.0–0.5)
EOS%: 7.6 % — AB (ref 0.0–7.0)
HCT: 47.3 % (ref 38.7–49.9)
HEMOGLOBIN: 17.2 g/dL — AB (ref 13.0–17.1)
LYMPH#: 1.8 10*3/uL (ref 0.9–3.3)
LYMPH%: 30.5 % (ref 14.0–48.0)
MCH: 33.3 pg (ref 28.0–33.4)
MCHC: 36.4 g/dL — ABNORMAL HIGH (ref 32.0–35.9)
MCV: 92 fL (ref 82–98)
MONO#: 0.7 10*3/uL (ref 0.1–0.9)
MONO%: 11 % (ref 0.0–13.0)
NEUT%: 50.2 % (ref 40.0–80.0)
NEUTROS ABS: 3 10*3/uL (ref 1.5–6.5)
PLATELETS: 142 10*3/uL — AB (ref 145–400)
RBC: 5.17 10*6/uL (ref 4.20–5.70)
RDW: 14 % (ref 11.1–15.7)
WBC: 5.9 10*3/uL (ref 4.0–10.0)

## 2016-05-01 LAB — COMPREHENSIVE METABOLIC PANEL
ALBUMIN: 3.8 g/dL (ref 3.5–5.0)
ALK PHOS: 70 U/L (ref 40–150)
ALT: 39 U/L (ref 0–55)
AST: 34 U/L (ref 5–34)
Anion Gap: 15 mEq/L — ABNORMAL HIGH (ref 3–11)
BILIRUBIN TOTAL: 0.59 mg/dL (ref 0.20–1.20)
BUN: 12.9 mg/dL (ref 7.0–26.0)
CO2: 18 meq/L — AB (ref 22–29)
Calcium: 10 mg/dL (ref 8.4–10.4)
Chloride: 105 mEq/L (ref 98–109)
Creatinine: 1.1 mg/dL (ref 0.7–1.3)
EGFR: 73 mL/min/{1.73_m2} — AB (ref >90)
GLUCOSE: 134 mg/dL (ref 70–140)
Potassium: 3.6 mEq/L (ref 3.5–5.1)
SODIUM: 138 meq/L (ref 136–145)
TOTAL PROTEIN: 7.9 g/dL (ref 6.4–8.3)

## 2016-05-01 NOTE — Progress Notes (Signed)
Hematology and Oncology Follow Up Visit  Ian [name] [id number] 09/26/1961 55 y.o. 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 He is doing well and has no complaints at this time. His [place] today showed no evidence of an acute DVT in the left lower extremity. 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  He has had no episodes of bleeding or bruising.  No fever, chills, n/v, cough, rash, dizziness, SOB, chest pain, palpitations, abdominal pain or changes in bowel or bladder habits.  He has no swelling or tenderness in his extremities. 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  He has maintained a good appetite and staying well hydrated. Her weight is stable.   Medications:    Medication List       This list is accurate as of: 05/01/16 12:39 PM.  Always use your most recent med list.               ALLERGIST [name]  Generic drug:  Tuberculin-Allergy Syringes  See admin instructions.     canagliflozin 100 MG Tabs tablet  Commonly known as:  INVOKANA  Take 1 tablet (100 mg total) by mouth daily before breakfast.     [name]  Take 25 mg by mouth daily.     docusate sodium 100 MG capsule  Commonly known as:  COLACE  Take 100 mg by mouth. 1 tablet in the morning and 2 tablets in the evening.     [name]  Generic drug:  EPINEPHrine  Inject 0.3 mg into the skin as needed (allergies).     fenofibrate micronized 134 MG capsule  Commonly known as:  LOFIBRA  TAKE 1 CAPSULE (134 MG TOTAL) BY MOUTH DAILY BEFORE BREAKFAST.     hydrochlorothiazide 25 MG tablet  Commonly known as:  HYDRODIURIL  Take 25 mg by mouth daily.     Icosapent Ethyl 1 g Caps  Take 2 g by mouth 2 (two) times daily.     LIVALO 4 MG Tabs  Generic drug:  Pitavastatin Calcium  TAKE 1 TABLET (4 MG TOTAL) BY MOUTH DAILY.     methocarbamol 500 MG tablet  Commonly known as:  ROBAXIN  Take 1 tablet (500 mg total) by mouth every 8 (eight) hours as needed for muscle spasms.     mupirocin cream 2 %  Commonly known as:  BACTROBAN  Apply 1 application topically 2 (two) times daily.     omeprazole 20 MG capsule  Commonly known as:  PRILOSEC  Take 1 capsule (20 mg total) by mouth daily.     [name]  Take 1 tablet by mouth daily. Reported on 01/11/2016     [name]  USE AS DIRECTED TO TEST BLOOD GLUCOSE DAILY DX: 250.00     [name]  Take 1 tablet by mouth daily.     XARELTO 20 MG Tabs tablet  Generic drug:  rivaroxaban  TAKE 1 TABLET BY MOUTH DAILY  zolpidem 5 MG tablet  Commonly known as:  AMBIEN  Take 1 tablet (5 mg total) by mouth at bedtime as needed for sleep.        Allergies:  Allergies  Allergen Reactions  . Tetanus Toxoids Anaphylaxis  . Daptomycin Other (See Comments)    myalgias  . Citrus Diarrhea    All citrus fruits  . Milk-Related Compounds Diarrhea    Milk and milk products  . Oxycodone   . Asa [Aspirin] Nausea Only and Rash  . Penicillins Rash  . Prednisone (Pak) [Prednisone] Other (See Comments)    Hyperactivity WITH ORAL, CAN TAKE SHOTS-Cannot do Dose pack    Past Medical History, Surgical history, Social history, and Family History were reviewed and updated.  Review of Systems: All other 10 point review of systems is negative.   Physical Exam:  height is 5' 8"  (1.727 m) and weight is 267 lb (121.11 kg). His oral temperature is 97.4 F (36.3 C). His blood pressure is 149/84 and his pulse is 70. His respiration is 16.   Wt  Readings from Last 3 Encounters:  05/01/16 267 lb (121.11 kg)  04/24/16 263 lb 4 oz (119.409 kg)  04/11/16 270 lb 9.6 oz (122.743 kg)    Ocular: Sclerae unicteric, pupils equal, round and reactive to light Ear-nose-throat: Oropharynx clear, dentition fair Lymphatic: No cervical supraclavicular or axillary adenopathy Lungs no rales or rhonchi, good excursion bilaterally Heart regular rate and rhythm, no murmur appreciated Abd soft, nontender, positive bowel sounds, no liver or spleen tip palpated on exam, no fluid wave MSK no focal spinal tenderness, no joint edema Neuro: non-focal, well-oriented, appropriate affect Breasts: Deferred  Lab Results  Component Value Date   WBC 5.9 05/01/2016   HGB 17.2* 05/01/2016   HCT 47.3 05/01/2016   MCV 92 05/01/2016   PLT 142* 05/01/2016   No results found for: FERRITIN, IRON, TIBC, UIBC, IRONPCTSAT Lab Results  Component Value Date   RBC 5.17 05/01/2016   No results found for: KPAFRELGTCHN, LAMBDASER, KAPLAMBRATIO No results found for: Kandis Cocking, IGMSERUM No results found for: Kathrynn Ducking, MSPIKE, SPEI   Chemistry      Component Value Date/Time   NA 136 04/11/2016 1002   NA 136 12/01/2015 1021   NA 135 08/23/2015 0758   K 3.5 04/11/2016 1002   K 3.8 12/01/2015 1021   K 3.4 08/23/2015 0758   CL 101 04/11/2016 1002   CL 101 08/23/2015 0758   CO2 28 04/11/2016 1002   CO2 15* 12/01/2015 1021   CO2 27 08/23/2015 0758   BUN 17 04/11/2016 1002   BUN 13.5 12/01/2015 1021   BUN 11 08/23/2015 0758   CREATININE 1.02 04/11/2016 1002   CREATININE 1.1 12/01/2015 1021   CREATININE 1.3* 08/23/2015 0758      Component Value Date/Time   CALCIUM 9.9 04/11/2016 1002   CALCIUM 9.7 12/01/2015 1021   CALCIUM 9.7 08/23/2015 0758   ALKPHOS 50 04/11/2016 1002   ALKPHOS 63 12/01/2015 1021   ALKPHOS 68 08/23/2015 0758   AST 33 04/11/2016 1002   AST 30 12/01/2015 1021   AST 39* 08/23/2015 0758    ALT 26 04/11/2016 1002   ALT 24 12/01/2015 1021   ALT 35 08/23/2015 0758   BILITOT 0.8 04/11/2016 1002   BILITOT 0.62 12/01/2015 1021   BILITOT 1.00 08/23/2015 0758     Impression and Plan: Ian [name] is a 55 yo white male with thrombus in the left  leg. [place] today showed no acute thrombus of the left leg but a slight improvement in the chronic nonocclusive DVT within the popliteal and distal aspects of the femoral vein. Now that he has completed a full year of anticoagulation we will take him down to 10 mg of Xarelto daily for one full year. Studies have shown this to be much more effective than aspirin (which the patient is allergic to) in preventing recurrent thrombus.  We will plan to see him back in 6 months for lab work and follow-up.  He will contact us with any questions or concerns. We can certainly see him sooner if need be.   Eliezer Bottom, NP 5/8/201712:39 PM

## 2016-05-06 ENCOUNTER — Other Ambulatory Visit: Payer: Self-pay | Admitting: Physician Assistant

## 2016-05-11 ENCOUNTER — Other Ambulatory Visit: Payer: Self-pay | Admitting: Physician Assistant

## 2016-05-12 ENCOUNTER — Other Ambulatory Visit: Payer: Self-pay | Admitting: *Deleted

## 2016-05-12 MED ORDER — HYDROCHLOROTHIAZIDE 25 MG PO TABS: 25.0000 mg | ORAL_TABLET | Freq: Every day | ORAL | Status: DC

## 2016-05-12 NOTE — Telephone Encounter (Signed)
Rx sent to the pharmacy by e-script.//AB/CMA 

## 2016-05-12 NOTE — Telephone Encounter (Signed)
Rx denied.   Rx has already been refilled.//AB/CMA

## 2016-05-23 ENCOUNTER — Encounter: Payer: Self-pay | Admitting: Physician Assistant

## 2016-05-23 ENCOUNTER — Ambulatory Visit (INDEPENDENT_AMBULATORY_CARE_PROVIDER_SITE_OTHER): Payer: BLUE CROSS/BLUE SHIELD | Admitting: Physician Assistant

## 2016-05-23 VITALS — BP 118/74 | HR 69 | Temp 98.3°F | Resp 16 | Ht 68.0 in | Wt 258.2 lb

## 2016-05-23 DIAGNOSIS — E119 Type 2 diabetes mellitus without complications: Secondary | ICD-10-CM | POA: Diagnosis not present

## 2016-05-23 DIAGNOSIS — E781 Pure hyperglyceridemia: Secondary | ICD-10-CM | POA: Diagnosis not present

## 2016-05-23 DIAGNOSIS — I10 Essential (primary) hypertension: Secondary | ICD-10-CM | POA: Diagnosis not present

## 2016-05-23 LAB — LIPID PANEL
CHOLESTEROL: 168 mg/dL (ref 0–200)
HDL: 25.9 mg/dL — AB (ref >39.00)
Total CHOL/HDL Ratio: 6

## 2016-05-23 LAB — LDL CHOLESTEROL, DIRECT: Direct LDL: 26 mg/dL

## 2016-05-23 NOTE — Patient Instructions (Signed)
Please increase your Invokana to 200 mg (2 tablets) daily. Continue all other medications as directed. Limit any refined sugars or alcohol to keep TGL down. You will be contacted by the [facility] for further management giving the complexity of your case (inability to tolerate most medications).  Call me in a couple of weeks to let me know how sugars are running on the new dose of medication.  FU in office in 3 month.

## 2016-05-23 NOTE — Progress Notes (Signed)
Patient presents to clinic today for follow-up of hypertension, HLD and DM II.  Hypertension -- Is currently on regimen of hydrochlorothiazide 25 mg daily. Patient denies chest pain, palpitations, lightheadedness, dizziness, vision changes or frequent headaches.  BP Readings from Last 3 Encounters:  05/23/16 118/74  05/01/16 149/84  04/24/16 118/88   Hyperlipidemia -- Has been uncontrolled, mainly triglyceride levels despite polypharmacy with Livalo 4 mg and Fenofibrate 134 mcg daily. Patient unable to tolerate other statins or higher doses of fibrates due to constipation. Is working on diet and exercise. Last TGL at 1702 on 04/11/16. This elevation despite consistent use of medication. Vascepa was added. Patient has been taking as directed but is causing nausea.  Referral to Lipid clinic recommended but has not been started as waiting on patient approval.  Diabetes Mellitus II -- Endorses taking medications as directed. Is currently on a regimen of Invokana 100 mg, . Fasting sugars averaging around 130-150. Is working on weight loss. Has lost 12 pounds after changing diet and exercising more. Last A1C at 7.9.   Past Medical History  Diagnosis Date  . Internal bleeding hemorrhoids 2012    "might bleed once/month; only when I strain"  . Hyperlipidemia   . Clostridium difficile infection 2014  . Testicular hypofunction   . Biceps tendon rupture     bilateral  . GERD (gastroesophageal reflux disease)   . Colon polyps   . Allergy   . Arthritis   . Hypertension   . Diabetes ([place])     Borderline/Pre-diabetes  . Leg cramps   . Internal prolapsed hemorrhoids     Current Outpatient Prescriptions on File Prior to Visit  Medication Sig Dispense Refill  . ALLERGIST [name] See admin instructions.  6  . Blood Glucose Monitoring Suppl (RELION CONFIRM GLUCOSE MONITOR) W/DEVICE KIT USE AS DIRECTED TO TEST BLOOD GLUCOSE DAILY DX: 250.00 1 kit 0  .  Cholecalciferol (VITAMIN D-3) 5000 UNITS TABS Take 1 tablet by mouth daily.    Marland Kitchen [name] Take 25 mg by mouth daily.    Marland Kitchen docusate sodium (COLACE) 100 MG capsule Take 100 mg by mouth daily.     Marland Kitchen EPIPEN 2-PAK 0.3 MG/0.3ML SOAJ injection Inject 0.3 mg into the skin as needed (allergies).     . fenofibrate micronized (LOFIBRA) 134 MG capsule TAKE 1 CAPSULE (134 MG TOTAL) BY MOUTH DAILY BEFORE BREAKFAST. 90 capsule 1  . hydrochlorothiazide (HYDRODIURIL) 25 MG tablet Take 1 tablet (25 mg total) by mouth daily. 90 tablet 1  . Icosapent Ethyl 1 g CAPS Take 2 g by mouth 2 (two) times daily. (Patient taking differently: Take 2 g by mouth daily. ) 120 capsule 3  . LIVALO 4 MG TABS TAKE 1 TABLET (4 MG TOTAL) BY MOUTH DAILY. 30 tablet 5  . methocarbamol (ROBAXIN) 500 MG tablet Take 1 tablet (500 mg total) by mouth every 8 (eight) hours as needed for muscle spasms. 90 tablet 1  . mupirocin cream (BACTROBAN) 2 % Apply 1 application topically 2 (two) times daily. 15 g 0  . omeprazole (PRILOSEC) 20 MG capsule Take 1 capsule (20 mg total) by mouth daily. 90 capsule 1  . [name] Take 1 tablet by mouth daily. Reported on 01/11/2016    . XARELTO 20 MG TABS tablet TAKE 1 TABLET BY MOUTH DAILY (Patient taking differently: TAKE 1/2 (10 mg) TABLET BY MOUTH DAILY) 30 tablet 2  . zolpidem (AMBIEN) 5  MG tablet Take 1 tablet (5 mg total) by mouth at bedtime as needed for sleep. 30 tablet 3   No current facility-administered medications on file prior to visit.    Allergies  Allergen Reactions  . Tetanus Toxoids Anaphylaxis  . Daptomycin Other (See Comments)    myalgias  . Citrus Diarrhea    All citrus fruits  . Milk-Related Compounds Diarrhea    Milk and milk products  . Oxycodone   . Asa [Aspirin] Nausea Only and Rash  . Penicillins Rash  . Prednisone (Pak) [Prednisone] Other (See Comments)    Hyperactivity WITH ORAL, CAN TAKE SHOTS-Cannot do Dose pack    Family History  Problem  Relation Age of Onset  . Heart disease Mother 60    Deceased  . Hyperlipidemia Mother   . Hypertension Mother   . Heart disease Father 45    Deceased  . CVA Father   . Hypertension Maternal Grandmother   . Hyperlipidemia Maternal Grandmother   . Cancer Maternal Grandmother     Colon Cancer  . Heart disease Maternal Grandfather   . Hypertension Maternal Grandfather   . Hyperlipidemia Maternal Grandfather   . Cancer Maternal Grandfather     Lung Cancer  . Heart attack Maternal Grandfather   . Heart disease Paternal Grandfather     Social History   Social History  . Marital Status: Married    Spouse Name: N/A  . Number of Children: N/A  . Years of Education: N/A   Social History Main Topics  . Smoking status: Never Smoker   . Smokeless tobacco: Never Used     Comment: NEVER USED TOBACCO  . Alcohol Use: No  . Drug Use: No  . Sexual Activity: Not Currently   Other Topics Concern  . None   Social History Narrative   Marital Status: Married Pamala Hurry)    Children:  Step Daughter Sharyn Lull)    Pets: None    Living Situation: Lives with Pamala Hurry    Occupation: [employer]    Education: Associate's Degree in [employer]    Tobacco Use/Exposure:  None    Alcohol Use:  Rarely    Drug Use:  None   Diet:  Regular   Exercise: Limited     Hobbies:  Hunting, Fishing              Review of Systems - See HPI.  All other ROS are negative.  BP 118/74 mmHg  Pulse 69  Temp(Src) 98.3 F (36.8 C) (Oral)  Resp 16  Ht 5' 8" (1.727 m)  Wt 258 lb 4 oz (117.141 kg)  BMI 39.28 kg/m2  SpO2 98%  Physical Exam  Constitutional: He is oriented to person, place, and time and well-developed, well-nourished, and in no distress.  HENT:  Head: Normocephalic and atraumatic.  Eyes: Conjunctivae are normal.  Neck: Neck supple.  Cardiovascular: Normal rate, regular rhythm, normal heart sounds and intact distal pulses.   Pulmonary/Chest: Effort normal and breath  sounds normal. No respiratory distress. He has no wheezes. He has no rales. He exhibits no tenderness.  Neurological: He is alert and oriented to person, place, and time.  Skin: Skin is warm and dry. No rash noted.  Psychiatric: Affect normal.  Vitals reviewed.   Recent Results (from the past 2160 hour(s))  CBC with Differential/Platelet     Status: Abnormal   Collection Time: 04/11/16 10:02 AM  Result Value Ref Range   WBC 5.8 4.0 - 10.5 K/uL  RBC 4.91 4.22 - 5.81 Mil/uL   Hemoglobin 16.4 13.0 - 17.0 g/dL   HCT 46.4 39.0 - 52.0 %   MCV 94.5 78.0 - 100.0 fl   MCHC 35.2 30.0 - 36.0 g/dL   RDW 13.5 11.5 - 15.5 %   Platelets 144.0 (L) 150.0 - 400.0 K/uL   Neutrophils Relative % 54.2 43.0 - 77.0 %   Lymphocytes Relative 28.5 12.0 - 46.0 %   Monocytes Relative 10.7 3.0 - 12.0 %   Eosinophils Relative 5.6 (H) 0.0 - 5.0 %   Basophils Relative 1.0 0.0 - 3.0 %   Neutro Abs 3.1 1.4 - 7.7 K/uL   Lymphs Abs 1.7 0.7 - 4.0 K/uL   Monocytes Absolute 0.6 0.1 - 1.0 K/uL   Eosinophils Absolute 0.3 0.0 - 0.7 K/uL   Basophils Absolute 0.1 0.0 - 0.1 K/uL  Comprehensive metabolic panel     Status: Abnormal   Collection Time: 04/11/16 10:02 AM  Result Value Ref Range   Sodium 136 135 - 145 mEq/L   Potassium 3.5 3.5 - 5.1 mEq/L   Chloride 101 96 - 112 mEq/L   CO2 28 19 - 32 mEq/L   Glucose, Bld 145 (H) 70 - 99 mg/dL   BUN 17 6 - 23 mg/dL   Creatinine, Ser 1.02 0.40 - 1.50 mg/dL   Total Bilirubin 0.8 0.2 - 1.2 mg/dL   Alkaline Phosphatase 50 39 - 117 U/L   AST 33 0 - 37 U/L   ALT 26 0 - 53 U/L   Total Protein 6.7 6.0 - 8.3 g/dL   Albumin 3.9 3.5 - 5.2 g/dL   Calcium 9.9 8.4 - 10.5 mg/dL   GFR 80.13 >60.00 mL/min  Hemoglobin A1c     Status: Abnormal   Collection Time: 04/11/16 10:02 AM  Result Value Ref Range   Hgb A1c MFr Bld 7.9 (H) 4.6 - 6.5 %    Comment: Glycemic Control Guidelines for People with Diabetes:Non Diabetic:  <6%Goal of Therapy: <7%Additional Action Suggested:  >8%   Lipid  panel     Status: Abnormal   Collection Time: 04/11/16 10:02 AM  Result Value Ref Range   Cholesterol 289 (H) 0 - 200 mg/dL    Comment: ATP III Classification       Desirable:  < 200 mg/dL               Borderline High:  200 - 239 mg/dL          High:  > = 240 mg/dL   Triglycerides (H) 0.0 - 149.0 mg/dL    1702.0 Triglyceride is over 400; calculations on Lipids are invalid.    Comment: Normal:  <150 mg/dLBorderline High:  150 - 199 mg/dL   HDL 21.60 (L) >39.00 mg/dL   Total CHOL/HDL Ratio 13     Comment:                Men          Women1/2 Average Risk     3.4          3.3Average Risk          5.0          4.42X Average Risk          9.6          7.13X Average Risk          15.0          11.0  Urinalysis, Routine w reflex microscopic (not at [facility])     Status: Abnormal   Collection Time: 04/11/16 10:02 AM  Result Value Ref Range   Color, Urine YELLOW Yellow;Lt. Yellow   APPearance CLEAR Clear   Specific Gravity, Urine 1.020 1.000-1.030   pH 6.0 5.0 - 8.0   Total Protein, Urine NEGATIVE Negative   Urine Glucose 250 (A) Negative   Ketones, ur NEGATIVE Negative   Bilirubin Urine NEGATIVE Negative   Hgb urine dipstick NEGATIVE Negative   Urobilinogen, UA 0.2 0.0 - 1.0   Leukocytes, UA NEGATIVE Negative   Nitrite NEGATIVE Negative   WBC, UA 0-2/hpf 0-2/hpf   RBC / HPF none seen 0-2/hpf   Squamous Epithelial / LPF Rare(0-4/hpf) Rare(0-4/hpf)  Microalbumin / creatinine urine ratio     Status: None   Collection Time: 04/11/16 10:02 AM  Result Value Ref Range   Microalb, Ur 0.7 0.0 - 1.9 mg/dL   Creatinine,U 176.6 mg/dL   Microalb Creat Ratio 0.4 0.0 - 30.0 mg/g  LDL cholesterol, direct     Status: None   Collection Time: 04/11/16 10:02 AM  Result Value Ref Range   Direct LDL 36.0 mg/dL    Comment: Optimal:  <100 mg/dLNear or Above Optimal:  100-129 mg/dLBorderline High:  130-159 mg/dLHigh:  160-189 mg/dLVery High:  >190 mg/dL  CBC w/Diff     Status: Abnormal    Collection Time: 05/01/16 10:31 AM  Result Value Ref Range   WBC 5.9 4.0 - 10.0 10e3/uL   RBC 5.17 4.20 - 5.70 10e6/uL   HGB 17.2 (H) 13.0 - 17.1 g/dL   HCT 47.3 38.7 - 49.9 %   MCV 92 82 - 98 fL   MCH 33.3 28.0 - 33.4 pg   MCHC 36.4 Corrected for Lipemia (H) 32.0 - 35.9 g/dL   RDW 14.0 11.1 - 15.7 %   Platelets 142 (L) 145 - 400 10e3/uL   NEUT# 3.0 1.5 - 6.5 10e3/uL   LYMPH# 1.8 0.9 - 3.3 10e3/uL   MONO# 0.7 0.1 - 0.9 10e3/uL   Eosinophils Absolute 0.5 0.0 - 0.5 10e3/uL   BASO# 0.0 0.0 - 0.2 10e3/uL   NEUT% 50.2 40.0 - 80.0 %   LYMPH% 30.5 14.0 - 48.0 %   MONO% 11.0 0.0 - 13.0 %   EOS% 7.6 (H) 0.0 - 7.0 %   BASO% 0.7 0.0 - 2.0 %  CMP     Status: Abnormal   Collection Time: 05/01/16 10:31 AM  Result Value Ref Range   Sodium 138 136 - 145 mEq/L   Potassium 3.6 3.5 - 5.1 mEq/L   Chloride 105 98 - 109 mEq/L   CO2 18 (L) 22 - 29 mEq/L   Glucose 134 70 - 140 mg/dl    Comment: Glucose reference range is for nonfasting patients. Fasting glucose reference range is 70- 100.   BUN 12.9 7.0 - 26.0 mg/dL   Creatinine 1.1 0.7 - 1.3 mg/dL   Total Bilirubin 0.59 0.20 - 1.20 mg/dL   Alkaline Phosphatase 70 40 - 150 U/L   AST 34 5 - 34 U/L   ALT 39 0 - 55 U/L   Total Protein 7.9 6.4 - 8.3 g/dL   Albumin 3.8 3.5 - 5.0 g/dL   Calcium 10.0 8.4 - 10.4 mg/dL   Anion Gap 15 (H) 3 - 11 mEq/L   EGFR 73 (L) >90 ml/min/1.73 m2    Comment: eGFR is calculated using the CKD-EPI Creatinine Equation (2009)  Lipid panel  Status: Abnormal   Collection Time: 05/23/16  9:55 AM  Result Value Ref Range   Cholesterol 168 0 - 200 mg/dL    Comment: ATP III Classification       Desirable:  < 200 mg/dL               Borderline High:  200 - 239 mg/dL          High:  > = 240 mg/dL   Triglycerides (H) 0.0 - 149.0 mg/dL    617.0 Triglyceride is over 400; calculations on Lipids are invalid.    Comment: Normal:  <150 mg/dLBorderline High:  150 - 199 mg/dL   HDL 25.90 (L) >39.00 mg/dL   Total CHOL/HDL Ratio 6       Comment:                Men          Women1/2 Average Risk     3.4          3.3Average Risk          5.0          4.42X Average Risk          9.6          7.13X Average Risk          15.0          11.0                      LDL cholesterol, direct     Status: None   Collection Time: 05/23/16  9:55 AM  Result Value Ref Range   Direct LDL 26.0 mg/dL    Comment: Optimal:  <100 mg/dLNear or Above Optimal:  100-129 mg/dLBorderline High:  130-159 mg/dLHigh:  160-189 mg/dLVery High:  >190 mg/dL    Assessment/Plan: Type 2 diabetes mellitus without complication Discussed diet and exercise for sugar control. Continue Invokana but will increase to 200 mg daily. Labs as ordered.  Hypertriglyceridemia Referral to lipid clinic place due to inability to get under control due to multiple factors -- patient unable to tolerate more potent statins. Is unable to tolerate higher doses of Vascepa and fenofibrate due to side effects. He is to continue regimen for now. Increase exercise. No sweets for now.   Essential hypertension, benign Controlled. Continue current regimen.

## 2016-05-23 NOTE — Progress Notes (Signed)
Pre visit review using our clinic review tool, if applicable. No additional management support is needed unless otherwise documented below in the visit note/SLS  

## 2016-05-24 ENCOUNTER — Telehealth: Payer: Self-pay | Admitting: *Deleted

## 2016-05-24 MED ORDER — CANAGLIFLOZIN 100 MG PO TABS: 100.0000 mg | ORAL_TABLET | Freq: Two times a day (BID) | ORAL | Status: DC

## 2016-05-24 NOTE — Telephone Encounter (Signed)
-----   Message from Brunetta Jeans, PA-C sent at 05/24/2016  7:50 AM EDT ----- Triglycerides have come down from 1700 to 600 which is great. Is still too high but again we are limited on medication options due to previous side effects and intolerance to higher doses of current medications. Continue meds as directed as the Vascepa has made a big difference. FU with Lipid Clinic (referral has been placed).

## 2016-05-24 NOTE — Telephone Encounter (Signed)
Called and spoke with the pt and informed him of recent lab results and note.  Pt verbalized understanding and agreed.  Pt stated that he made his appt with the Lipid clinic today.  Appt:(July 17 @ 8:45am).  Pt asked if a new prescription for the Invokana would be sent to the pharmacy with the new dosage.  New prescription sent to the pharmacy by e-script.//AB/CMA

## 2016-05-28 ENCOUNTER — Other Ambulatory Visit: Payer: Self-pay | Admitting: Physician Assistant

## 2016-05-28 NOTE — Assessment & Plan Note (Addendum)
Discussed diet and exercise for sugar control. Continue Invokana but will increase to 200 mg daily. Labs as ordered.

## 2016-05-28 NOTE — Assessment & Plan Note (Signed)
Referral to lipid clinic place due to inability to get under control due to multiple factors -- patient unable to tolerate more potent statins. Is unable to tolerate higher doses of Vascepa and fenofibrate due to side effects. He is to continue regimen for now. Increase exercise. No sweets for now.

## 2016-05-28 NOTE — Assessment & Plan Note (Signed)
Controlled. Continue current regimen. 

## 2016-06-05 ENCOUNTER — Telehealth: Payer: Self-pay | Admitting: *Deleted

## 2016-06-05 NOTE — Telephone Encounter (Signed)
Patient wants to know if he can take ibuprofen 400mg  daily while on Xarelto 10mg  daily.  Spoke with Dr Marin Olp who states it's okay for patient to take ibuprofen 400mg  daily as long as he takes it with food.  Reviewed with patient. He will take the ibuprofen with breakfast daily.

## 2016-06-12 ENCOUNTER — Other Ambulatory Visit: Payer: Self-pay | Admitting: *Deleted

## 2016-06-12 MED ORDER — CANAGLIFLOZIN 100 MG PO TABS: 100.0000 mg | ORAL_TABLET | Freq: Two times a day (BID) | ORAL | Status: DC

## 2016-06-12 NOTE — Telephone Encounter (Signed)
Pt presented to clinic to inform PCP that his rx for Invokana needs to be written for 31 day supply/62 tablets in order for copay to be covered through his savings plan. Medication filled to pharmacy as requested and PCP made aware.

## 2016-06-13 ENCOUNTER — Telehealth: Payer: Self-pay | Admitting: Physician Assistant

## 2016-06-13 NOTE — Telephone Encounter (Signed)
Pt dropped off [place] and Living Will for PCP to see and have copy on pt's chart.

## 2016-06-20 ENCOUNTER — Other Ambulatory Visit: Payer: Self-pay | Admitting: Family Medicine

## 2016-06-20 NOTE — Telephone Encounter (Signed)
Faxed hardcopy for zolpidem to [facility] in [place]

## 2016-07-10 ENCOUNTER — Encounter: Payer: Self-pay | Admitting: Cardiovascular Disease

## 2016-07-10 ENCOUNTER — Ambulatory Visit (INDEPENDENT_AMBULATORY_CARE_PROVIDER_SITE_OTHER): Payer: BLUE CROSS/BLUE SHIELD | Admitting: Cardiovascular Disease

## 2016-07-10 VITALS — BP 140/92 | HR 78 | Ht 68.0 in | Wt 257.1 lb

## 2016-07-10 DIAGNOSIS — I10 Essential (primary) hypertension: Secondary | ICD-10-CM | POA: Diagnosis not present

## 2016-07-10 DIAGNOSIS — E781 Pure hyperglyceridemia: Secondary | ICD-10-CM

## 2016-07-10 DIAGNOSIS — I2699 Other pulmonary embolism without acute cor pulmonale: Secondary | ICD-10-CM

## 2016-07-10 NOTE — Progress Notes (Signed)
Cardiology Office Note   Date:  07/10/2016   ID:  Ian [name], DOB 1961-03-25, MRN [id number]  PCP:  Leeanne Rio, PA-C  Cardiologist:   Mertie Moores, MD   Chief Complaint  Patient presents with  . Follow-up    HTN  Father and Problem list 1. Hypertension 2. Hypertriglyceridemia  3. Diabetes mellitus 4. DVT / Pulmonary emboli - following back surgery    History of Present Illness: Ian [name] is a 55 y.o. male who presents for further evaluation of his hyperlipidemia / hypertriglyceridemia .   Hx of pulmonary embolus following back surgery .  Is on low dose Xarelto 10 mg a day   Trigs have been as high as 1700 in the past.   They  are around 600 now  Eats lots of grilled chicken.   Beef once a week.   has tried Lipitor and crestor but did not tolerate either one of these. He's currently on Livalo.     Has tried Fenofibrate ( has some constipation )   No CP or dyspnea. HbA1C was 7.9.   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       Past Medical History  Diagnosis Date  . Internal bleeding hemorrhoids 2012    "might bleed once/month; only when I strain"  . Hyperlipidemia   . Clostridium difficile infection 2014  . Testicular hypofunction   . Biceps tendon rupture     bilateral  . GERD (gastroesophageal reflux disease)   . Colon polyps   . Allergy   . Arthritis   . Hypertension   . Diabetes ([place])     Borderline/Pre-diabetes  . Leg cramps   . Internal prolapsed hemorrhoids     Past Surgical History  Procedure Laterality Date  . Shoulder arthroscopy w/ rotator cuff repair  07/09/2012    left  . Colonoscopy w/ biopsies and polypectomy  2012  . Shoulder arthroscopy  08/22/2012    Procedure: ARTHROSCOPY SHOULDER;  Surgeon: Marin Shutter, MD;  Location: [facility];  Service: Orthopedics;  Laterality: Left;  Left shoulder arthroscopic lavage and debridement  . Shoulder arthroscopy  09/02/2012    Procedure: ARTHROSCOPY  SHOULDER;  Surgeon: Marin Shutter, MD;  Location: [facility];  Service: Orthopedics;  Laterality: Left;  Left shoulder arthroscopy irrigation and debridement  . Fissurectomy    . Excisional hemorrhoidectomy    . Internal sphincterotomy  06-24-2013  . Hemorrhoid surgery  06/24/2013  . Anal fissure repair  06/24/2013  . Wisdom tooth extraction  YRS AGO  . Skin lesion excision      Benign  . Appendectomy  2011  . Colonscopy  2015  . Lumbar laminectomy/decompression microdiscectomy N/A 01/20/2015    Procedure: MICRO LUMBAR DECOMPRESSION L4-5/L3-4/L2-3;  Surgeon: Johnn Hai, MD;  Location: [facility];  Service: Orthopedics;  Laterality: N/A;     Current Outpatient Prescriptions  Medication Sig Dispense Refill  . ALLERGIST [name] See admin instructions.  6  . Blood Glucose Monitoring Suppl (RELION CONFIRM GLUCOSE MONITOR) W/DEVICE KIT USE AS DIRECTED TO TEST BLOOD GLUCOSE DAILY DX: 250.00 1 kit 0  . canagliflozin (INVOKANA) 100 MG TABS tablet Take 1 tablet (100 mg total) by mouth 2 (two) times daily. 62 tablet 1  . Cholecalciferol (VITAMIN D-3) 5000 UNITS TABS Take 1 tablet by mouth daily.    Marland Kitchen [name] Take 25 mg by mouth daily.    Marland Kitchen  docusate sodium (COLACE) 100 MG capsule Take 100 mg by mouth daily.     Marland Kitchen EPIPEN 2-PAK 0.3 MG/0.3ML SOAJ injection Inject 0.3 mg into the skin as needed (allergies).     . fenofibrate micronized (LOFIBRA) 134 MG capsule TAKE 1 CAPSULE (134 MG TOTAL) BY MOUTH DAILY BEFORE BREAKFAST. 90 capsule 1  . hydrochlorothiazide (HYDRODIURIL) 25 MG tablet Take 1 tablet (25 mg total) by mouth daily. 90 tablet 1  . Icosapent Ethyl 1 g CAPS Take 2 g by mouth 2 (two) times daily. (Patient taking differently: Take 2 g by mouth daily. ) 120 capsule 3  . LIVALO 4 MG TABS TAKE 1 TABLET (4 MG TOTAL) BY MOUTH DAILY. 30 tablet 5  . methocarbamol (ROBAXIN) 500 MG tablet Take 1 tablet (500 mg total) by mouth every 8 (eight) hours as needed for  muscle spasms. 90 tablet 1  . mupirocin cream (BACTROBAN) 2 % Apply 1 application topically 2 (two) times daily. 15 g 0  . omeprazole (PRILOSEC) 20 MG capsule Take 1 capsule (20 mg total) by mouth daily. 90 capsule 1  . [name] Take 1 tablet by mouth daily. Reported on 01/11/2016    . XARELTO 20 MG TABS tablet TAKE 1 TABLET BY MOUTH DAILY 30 tablet 5  . zolpidem (AMBIEN) 5 MG tablet TAKE 1 TABLET BY MOUTH AT BEDTIME AS NEEDED FOR SLEEP 30 tablet 0   No current facility-administered medications for this visit.    Allergies:   Tetanus toxoids; Daptomycin; Citrus; Milk-related compounds; Oxycodone; Asa; Penicillins; and Prednisone (pak)    Social History:  The patient  reports that he has never smoked. He has never used smokeless tobacco. He reports that he does not drink alcohol or use illicit drugs.   Family History:  The patient's family history includes CVA in his father; Cancer in his maternal grandfather and maternal grandmother; Heart attack in his maternal grandfather; Heart disease in his maternal grandfather and paternal grandfather; Heart disease (age of onset: 64) in his mother; Heart disease (age of onset: 5) in his father; Hyperlipidemia in his maternal grandfather, maternal grandmother, and mother; Hypertension in his maternal grandfather, maternal grandmother, and mother.    ROS:  Please see the history of present illness.    Review of Systems: Constitutional:  denies fever, chills, diaphoresis, appetite change and fatigue.  HEENT: denies photophobia, eye pain, redness, hearing loss, ear pain, congestion, sore throat, rhinorrhea, sneezing, neck pain, neck stiffness and tinnitus.  Respiratory: denies SOB, DOE, cough, chest tightness, and wheezing.  Cardiovascular: denies chest pain, palpitations and leg swelling.  Gastrointestinal: denies nausea, vomiting, abdominal pain, diarrhea, constipation, blood in stool.  Genitourinary: denies dysuria, urgency, frequency,  hematuria, flank pain and difficulty urinating.  Musculoskeletal: admits to  myalgias, back pain, joint swelling, arthralgias and gait problem.   Skin: denies pallor, rash and wound.  Neurological: denies dizziness, seizures, syncope, weakness, light-headedness, numbness and headaches.   Hematological: denies adenopathy, easy bruising, personal or family bleeding history.  Psychiatric/ Behavioral: admits to suicidal ideation, mood changes, confusion, nervousness, sleep disturbance and agitation.       All other systems are reviewed and negative.    PHYSICAL EXAM: VS:  BP 140/92 mmHg  Pulse 78  Ht 5' 8"  (1.727 m)  Wt 257 lb 1.9 oz (116.629 kg)  BMI 39.10 kg/m2 , BMI Body mass index is 39.1 kg/(m^2). GEN: Well nourished, well developed, in no acute distress HEENT: normal Neck: no JVD, carotid bruits, or masses Cardiac:  RRR; no murmurs, rubs, or gallops,no edema  Respiratory:  clear to auscultation bilaterally, normal work of breathing GI: soft, nontender, nondistended, + BS MS: no deformity or atrophy Skin: warm and dry, no rash Neuro:  Strength and sensation are intact Psych: normal   EKG:  EKG is ordered today. The ekg ordered today demonstrates  NSR at 78.     Poor R wave progression    Recent Labs: 05/01/2016: ALT 39; BUN 12.9; Creatinine 1.1; HGB 17.2*; Platelets 142*; Potassium 3.6; Sodium 138    Lipid Panel    Component Value Date/Time   CHOL 168 05/23/2016 0955   TRIG * 05/23/2016 0955    617.0 Triglyceride is over 400; calculations on Lipids are invalid.   HDL 25.90* 05/23/2016 0955   CHOLHDL 6 05/23/2016 0955   CHOLHDL 6.0 09/18/2014 1649   VLDL 30.0 12/22/2014 0756   LDLCALC 103* 12/22/2014 0756   LDLDIRECT 26.0 05/23/2016 0955      Wt Readings from Last 3 Encounters:  07/10/16 257 lb 1.9 oz (116.629 kg)  05/23/16 258 lb 4 oz (117.141 kg)  05/01/16 267 lb (121.11 kg)      Other studies Reviewed: Additional studies/ records that were reviewed today  include: . Review of the above records demonstrates:    ASSESSMENT AND PLAN:  1.   Hypertriglyceridemia:   Trigs are very elevate.  Has tried several statins - only tolerates Livalo On fenofibrate   He needs to go to lipid clinic and be considered for Repatha  We discussed improving his diet. He still eats bread every night for dinner. We discussed that he should probably cut back on his been intact. He gets some exercise but perhaps not as much as he should   I've recommended that he try a statiionary bike  2.   Essential HTN:   BP is stable   3. Hx of pulmonary embolus :  Continue xarelto 10 mg a day    I'll see him in 1 year    Current medicines are reviewed at length with the patient today.  The patient does not have concerns regarding medicines.  Labs/ tests ordered today include:  No orders of the defined types were placed in this encounter.     Disposition:   FU with me in 1 year      Mertie Moores, MD  07/10/2016 9:12 AM    [facility] [place], [place], [place]  [address] Phone: [phone]; Fax: [phone]   [facility]  [address] [place], [place]  [address] [phone]   Fax [phone]

## 2016-07-10 NOTE — Patient Instructions (Signed)
Medication Instructions:  Your physician recommends that you continue on your current medications as directed. Please refer to the Current Medication list given to you today.   Labwork: None Ordered   Testing/Procedures: None Ordered   Follow-Up: You have been referred to Lipid Clinic for high triglycerides   Your physician wants you to follow-up in: 1 year with Dr. Acie Fredrickson.  You will receive a reminder letter in the mail two months in advance. If you don't receive a letter, please call our office to schedule the follow-up appointment.   If you need a refill on your cardiac medications before your next appointment, please call your pharmacy.   Thank you for choosing [facility]! Christen Bame, RN [phone]

## 2016-07-17 ENCOUNTER — Ambulatory Visit: Payer: BLUE CROSS/BLUE SHIELD | Admitting: Pharmacist

## 2016-07-17 ENCOUNTER — Ambulatory Visit (INDEPENDENT_AMBULATORY_CARE_PROVIDER_SITE_OTHER): Payer: BLUE CROSS/BLUE SHIELD | Admitting: Pharmacist

## 2016-07-17 DIAGNOSIS — E785 Hyperlipidemia, unspecified: Secondary | ICD-10-CM | POA: Diagnosis not present

## 2016-07-17 DIAGNOSIS — E781 Pure hyperglyceridemia: Secondary | ICD-10-CM

## 2016-07-17 MED ORDER — FENOFIBRATE 145 MG PO TABS: 145.0000 mg | ORAL_TABLET | Freq: Every day | ORAL | 11 refills | Status: DC

## 2016-07-17 NOTE — Patient Instructions (Addendum)
Stop taking your fenofibrate 134mg  capsules and start taking the 145mg  tablets - you may tolerate them better. Take one daily with food.  Continue taking your Vascepa and Livalo.   Recheck your cholesterol in 3 months and follow up in lipid clinic the following day.

## 2016-07-17 NOTE — Progress Notes (Signed)
Patient ID: Ian [name]                 DOB: [birth year]                    MRN: [id number]     HPI: Ian [name] is a pleasant 55 y.o. male patient referred to lipid clinic by Dr. Acie Fredrickson for elevated triglycerides. PMH is significant for HLD, elevated TG, DM, HTN, and history of PE following back surgery.   TG have been as high as 1700 previously, dropped to ~600 6 weeks later. Pt reported he ate less beef during this time and that is the only thing he did differently. He is intolerant to atorvastatin and rosuvastatin, currently tolerating pitavastatin. He takes fenofibrate but it causes constipation (~2% incidence). He has rechallenged and symptoms presented again. Does take hydrocodone as well but reports that he had symptoms when he was on the fenofibrate and not hydrocodone. Reports that he takes ~50% of his doses of fenofibrate. Intolerant to gemfibrozil and niacin. Does not drink alcohol and avoids sugary foods. Watches his carbs since he is diabetic.  Current Medications: pitavastatin 58m daily, fenofibrate 1371mdaily, Vascepa 4g daily Intolerances: atorvastatin 10 and 2069maily, rosuvastatin - muscle aches with both, gemfibrozil 600m78mD, Zetia 10mg65mly, niacin - flushing, Welchol 625mg 45mbs BID TG goal: as close to 150 as possible  Diet: Eats greens, beef - 3x a week and has since cut out. Doesn't drink alcohol. Diabetic - knows carbohydrate sources well. Doesn't eat sweets. Focusing more on lean chicken. Eats 2 eggs most days a week.  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   Family History: Maternal grandfather with heart disease and MI, mother was a smoker with CAD, dad died from gallbladder complications.  Social History: Denies alcohol use, cigarette use, or illicit drug use.  Labs: 05/23/16: TC 168, TG 617, HDL 25.9, LDL-D 26 (Livalo 4mg, V5mepa 4g, fenofibrate 134mg bu13mports eating less meat) 04/11/16: TC 289, TG 1702, HDL 21.6, LDL-D 36 (Livalo 4mg,  Va29mpa 4g, fenofibrate 134mg). A133m9%  Past Medical History:  Diagnosis Date  . Allergy   . Arthritis   . Biceps tendon rupture    bilateral  . Clostridium difficile infection 2014  . Colon polyps   . Diabetes ([facility])    BoTidiouterline/Pre-diabetes  . GERD (gastroesophageal reflux disease)   . Hyperlipidemia   . Hypertension   . Internal bleeding hemorrhoids 2012   "might bleed once/month; only when I strain"  . Internal prolapsed hemorrhoids   . Leg cramps   . Testicular hypofunction     Current Outpatient Prescriptions on File Prior to Visit  Medication Sig Dispense Refill  . ALLERGIST [name] See admin instructions.  6  . Blood Glucose Monitoring Suppl (RELION CONFIRM GLUCOSE MONITOR) W/DEVICE KIT USE AS DIRECTED TO TEST BLOOD GLUCOSE DAILY DX: 250.00 1 kit 0  . canagliflozin (INVOKANA) 100 MG TABS tablet Take 1 tablet (100 mg total) by mouth 2 (two) times daily. 62 tablet 1  . Cholecalciferol (VITAMIN D-3) 5000 UNITS TABS Take 1 tablet by mouth daily.    . CLOMIPHEMarland KitchenE CITRATE PO Take 25 mg by mouth daily.    . docusateMarland Kitchensodium (COLACE) 100 MG capsule Take 100 mg by mouth daily.     . EPIPEN 2Marland KitchenPAK 0.3 MG/0.3ML SOAJ injection Inject 0.3 mg into the skin as needed (allergies).     .Marland Kitchen  fenofibrate micronized (LOFIBRA) 134 MG capsule TAKE 1 CAPSULE (134 MG TOTAL) BY MOUTH DAILY BEFORE BREAKFAST. 90 capsule 1  . hydrochlorothiazide (HYDRODIURIL) 25 MG tablet Take 1 tablet (25 mg total) by mouth daily. 90 tablet 1  . Icosapent Ethyl 1 g CAPS Take 2 g by mouth 2 (two) times daily. (Patient taking differently: Take 2 g by mouth daily. ) 120 capsule 3  . LIVALO 4 MG TABS TAKE 1 TABLET (4 MG TOTAL) BY MOUTH DAILY. 30 tablet 5  . methocarbamol (ROBAXIN) 500 MG tablet Take 1 tablet (500 mg total) by mouth every 8 (eight) hours as needed for muscle spasms. 90 tablet 1  . mupirocin cream (BACTROBAN) 2 % Apply 1 application topically 2 (two) times daily. 15 g 0  .  omeprazole (PRILOSEC) 20 MG capsule Take 1 capsule (20 mg total) by mouth daily. 90 capsule 1  . [name] Take 1 tablet by mouth daily. Reported on 01/11/2016    . rivaroxaban (XARELTO) 10 MG TABS tablet Take 10 mg by mouth daily.    Marland Kitchen zolpidem (AMBIEN) 5 MG tablet TAKE 1 TABLET BY MOUTH AT BEDTIME AS NEEDED FOR SLEEP 30 tablet 0   No current facility-administered medications on file prior to visit.     Allergies  Allergen Reactions  . Tetanus Toxoids Anaphylaxis  . Daptomycin Other (See Comments)    myalgias  . Citrus Diarrhea    All citrus fruits  . Milk-Related Compounds Diarrhea    Milk and milk products  . Oxycodone   . Asa [Aspirin] Nausea Only and Rash  . Penicillins Rash  . Prednisone (Pak) [Prednisone] Other (See Comments)    Hyperactivity WITH ORAL, CAN TAKE SHOTS-Cannot do Dose pack    Assessment/Plan:  1. Hypertriglyceridemia - TG improved from 1700 to 600 in 6 weeks with dietary changes. Pt already taking max dose Vascepa and Livalo. Intolerant to Crestor, Lipitor, Zetia, Welchol, niacin, and gemfibrozil so therapy options are limited. PCSK9i not indicated for hypertriglyceridemia. Pt is only taking ~50% of his fenofibrate 151m capsule doses d/t constipation. Will try switching to 1421mtablets to see if he tolerates better. Advised him to take daily with food. Patient will also work to limit egg yolks and red meat. F/u with lipid panel and office visit in 3 months.    Haelee Bolen E. Meshawn Oconnor, PharmD, [id number]. [id number]ypress[place] [id number]: [phone]: [phone]/24/2017 4:09 PM

## 2016-07-18 ENCOUNTER — Ambulatory Visit: Payer: BLUE CROSS/BLUE SHIELD

## 2016-08-05 ENCOUNTER — Other Ambulatory Visit: Payer: Self-pay | Admitting: Physician Assistant

## 2016-08-07 NOTE — Telephone Encounter (Signed)
Per provider instructions, called and spoke with patient RE: Rx refill request [wanting dispense and directions for [3] times daily; per last OV directions are to take [2] tablets qd; pt needs F/U appt to discuss. Patient informed, understood & agreed; new Rx to pharmacy with note/SLS 08/14

## 2016-08-21 ENCOUNTER — Encounter: Payer: Self-pay | Admitting: Physician Assistant

## 2016-08-21 ENCOUNTER — Ambulatory Visit (INDEPENDENT_AMBULATORY_CARE_PROVIDER_SITE_OTHER): Payer: BLUE CROSS/BLUE SHIELD | Admitting: Physician Assistant

## 2016-08-21 VITALS — BP 128/86 | HR 73 | Temp 97.4°F | Resp 16 | Ht 68.0 in | Wt 248.0 lb

## 2016-08-21 DIAGNOSIS — E119 Type 2 diabetes mellitus without complications: Secondary | ICD-10-CM

## 2016-08-21 DIAGNOSIS — E781 Pure hyperglyceridemia: Secondary | ICD-10-CM

## 2016-08-21 DIAGNOSIS — I10 Essential (primary) hypertension: Secondary | ICD-10-CM | POA: Diagnosis not present

## 2016-08-21 DIAGNOSIS — Z23 Encounter for immunization: Secondary | ICD-10-CM

## 2016-08-21 LAB — LIPID PANEL
CHOL/HDL RATIO: 6
CHOLESTEROL: 163 mg/dL (ref 0–200)
HDL: 27.3 mg/dL — ABNORMAL LOW (ref >39.00)
Triglycerides: 545 mg/dL — ABNORMAL HIGH (ref 0.0–149.0)

## 2016-08-21 LAB — COMPREHENSIVE METABOLIC PANEL
ALBUMIN: 4.1 g/dL (ref 3.5–5.2)
ALT: 31 U/L (ref 0–53)
AST: 33 U/L (ref 0–37)
Alkaline Phosphatase: 35 U/L — ABNORMAL LOW (ref 39–117)
BUN: 19 mg/dL (ref 6–23)
CHLORIDE: 99 meq/L (ref 96–112)
CO2: 29 meq/L (ref 19–32)
CREATININE: 1.13 mg/dL (ref 0.40–1.50)
Calcium: 9.5 mg/dL (ref 8.4–10.5)
GFR: 71.1 mL/min (ref >60.00)
Glucose, Bld: 96 mg/dL (ref 70–99)
POTASSIUM: 3.3 meq/L — AB (ref 3.5–5.1)
SODIUM: 137 meq/L (ref 135–145)
Total Bilirubin: 0.9 mg/dL (ref 0.2–1.2)
Total Protein: 6.8 g/dL (ref 6.0–8.3)

## 2016-08-21 LAB — MICROALBUMIN / CREATININE URINE RATIO
CREATININE, U: 104.2 mg/dL
Microalb Creat Ratio: 0.7 mg/g (ref 0.0–30.0)

## 2016-08-21 LAB — LDL CHOLESTEROL, DIRECT: LDL DIRECT: 35 mg/dL

## 2016-08-21 LAB — HEMOGLOBIN A1C: HEMOGLOBIN A1C: 6.6 % — AB (ref 4.6–6.5)

## 2016-08-21 NOTE — Progress Notes (Signed)
Patient presents to clinic today for follow-up.  Hypertension -- endorses taking blood pressure medications daily as directed. Patient denies chest pain, palpitations, lightheadedness, dizziness, vision changes or frequent headaches.  BP Readings from Last 3 Encounters:  08/21/16 128/86  07/10/16 (!) 140/92  05/23/16 118/74   Hyperlipidemia -- Followed by lipid clinic. Is on combination of fenofibrate, lavage and the Cipro. Cannot tolerate statins. Is fasting for repeat lipids today.   Diabetes Mellitus II without complication -- Is currently taking Elavil, 200 mg daily. Tolerating medication well. Fasting blood sugars are averaging 90-110. Patient without history of retinopathy or nephropathy. Foot exam and eye exam up-to-date. Patient is due for repeat A1c, being met and urine microalbumin. We'll be ordering today.  Past Medical History:  Diagnosis Date  . Allergy   . Arthritis   . Biceps tendon rupture    bilateral  . Clostridium difficile infection 2014  . Colon polyps   . Diabetes ([place])    Borderline/Pre-diabetes  . GERD (gastroesophageal reflux disease)   . Hyperlipidemia   . Hypertension   . Internal bleeding hemorrhoids 2012   "might bleed once/month; only when I strain"  . Internal prolapsed hemorrhoids   . Leg cramps   . Testicular hypofunction     Current Outpatient Prescriptions on File Prior to Visit  Medication Sig Dispense Refill  . ALLERGIST [name] See admin instructions.  6  . Blood Glucose Monitoring Suppl (RELION CONFIRM GLUCOSE MONITOR) W/DEVICE KIT USE AS DIRECTED TO TEST BLOOD GLUCOSE DAILY DX: 250.00 1 kit 0  . canagliflozin (INVOKANA) 100 MG TABS tablet Take 2 tablets (200 mg total) by mouth daily before breakfast. 62 tablet 1  . Cholecalciferol (VITAMIN D-3) 5000 UNITS TABS Take 1 tablet by mouth daily.    Marland Kitchen [name] Take 25 mg by mouth daily.    Marland Kitchen docusate sodium (COLACE) 100 MG capsule Take 100 mg  by mouth daily.     Marland Kitchen EPIPEN 2-PAK 0.3 MG/0.3ML SOAJ injection Inject 0.3 mg into the skin as needed (allergies).     . fenofibrate (TRICOR) 145 MG tablet Take 1 tablet (145 mg total) by mouth daily. 30 tablet 11  . hydrochlorothiazide (HYDRODIURIL) 25 MG tablet Take 1 tablet (25 mg total) by mouth daily. 90 tablet 1  . HYDROcodone-acetaminophen (NORCO/VICODIN) 5-325 MG tablet Take 1 tablet by mouth as needed for pain.    Vanessa Kick Ethyl 1 g CAPS Take 2 g by mouth 2 (two) times daily. 120 capsule 3  . LIVALO 4 MG TABS TAKE 1 TABLET (4 MG TOTAL) BY MOUTH DAILY. 30 tablet 5  . methocarbamol (ROBAXIN) 500 MG tablet Take 1 tablet (500 mg total) by mouth every 8 (eight) hours as needed for muscle spasms. 90 tablet 1  . omeprazole (PRILOSEC) 20 MG capsule Take 1 capsule (20 mg total) by mouth daily. 90 capsule 1  . [name] Take 1 tablet by mouth daily. Reported on 01/11/2016    . rivaroxaban (XARELTO) 10 MG TABS tablet Take 10 mg by mouth daily.    Marland Kitchen zolpidem (AMBIEN) 5 MG tablet TAKE 1 TABLET BY MOUTH AT BEDTIME AS NEEDED FOR SLEEP 30 tablet 0   No current facility-administered medications on file prior to visit.     Allergies  Allergen Reactions  . Tetanus Toxoids Anaphylaxis  . Daptomycin Other (See Comments)    myalgias  . Citrus Diarrhea    All citrus fruits  .  Milk-Related Compounds Diarrhea    Milk and milk products  . Oxycodone   . Asa [Aspirin] Nausea Only and Rash  . Penicillins Rash  . Prednisone (Pak) [Prednisone] Other (See Comments)    Hyperactivity WITH ORAL, CAN TAKE SHOTS-Cannot do Dose pack    Family History  Problem Relation Age of Onset  . Heart disease Mother 59    Deceased  . Hyperlipidemia Mother   . Hypertension Mother   . Heart disease Father 68    Deceased  . CVA Father   . Hypertension Maternal Grandmother   . Hyperlipidemia Maternal Grandmother   . Cancer Maternal Grandmother     Colon Cancer  . Heart disease Maternal Grandfather   .  Hypertension Maternal Grandfather   . Hyperlipidemia Maternal Grandfather   . Cancer Maternal Grandfather     Lung Cancer  . Heart attack Maternal Grandfather   . Heart disease Paternal Grandfather     Social History   Social History  . Marital status: Married    Spouse name: N/A  . Number of children: N/A  . Years of education: N/A   Social History Main Topics  . Smoking status: Never Smoker  . Smokeless tobacco: Never Used     Comment: NEVER USED TOBACCO  . Alcohol use No  . Drug use: No  . Sexual activity: Not Currently   Other Topics Concern  . None   Social History Narrative   Marital Status: Married Pamala Hurry)    Children:  Step Daughter Sharyn Lull)    Pets: None    Living Situation: Lives with Pamala Hurry    Occupation: [employer] ([place])    Education: Associate's Degree in [employer]    Tobacco Use/Exposure:  None    Alcohol Use:  Rarely    Drug Use:  None   Diet:  Regular   Exercise: Limited     Hobbies:  Hunting, Fishing             Review of Systems - See HPI.  All other ROS are negative.  BP 128/86 (BP Location: Left Arm, Patient Position: Sitting, Cuff Size: Large)   Pulse 73   Temp 97.4 F (36.3 C) (Oral)   Resp 16   Ht _0  (1.727 m)   Wt 248 lb (112.5 kg)   SpO2 100%   BMI 37.71 kg/m   Physical Exam  Constitutional: He is oriented to person, place, and time and well-developed, well-nourished, and in no distress.  HENT:  Head: Normocephalic and atraumatic.  Cardiovascular: Normal rate, regular rhythm, normal heart sounds and intact distal pulses.   Pulmonary/Chest: Effort normal and breath sounds normal. No respiratory distress. He has no wheezes. He has no rales. He exhibits no tenderness.  Neurological: He is alert and oriented to person, place, and time.  Skin: Skin is warm and dry. No rash noted.  Psychiatric: Affect normal.  Vitals reviewed.   Assessment/Plan: Type 2 diabetes mellitus without complication Will check  BMP, A1c and urine microalbumin. Continue current regimen for now. Dietary and exercise recommendations discussed. Patient to continue checking fasting sugars daily. We'll alter regimen based on results.  Hypertriglyceridemia Continue current medication regimen. Will obtain fasting lipid panel today.  Essential hypertension, benign BP well-controlled. Asymptomatic. We'll check urine microalbumin today.    Leeanne Rio, PA-C

## 2016-08-21 NOTE — Assessment & Plan Note (Signed)
Will check BMP, A1c and urine microalbumin. Continue current regimen for now. Dietary and exercise recommendations discussed. Patient to continue checking fasting sugars daily. We'll alter regimen based on results.

## 2016-08-21 NOTE — Patient Instructions (Signed)
Please go to the left of blood work. We will call you with your results. Continue medications as directed. Continue checking fasting sugars. Goal is between 90 and 110. Follow-up will be scheduled based off of your lab results.   Diabetes and Exercise Exercising regularly is important. It is not just about losing weight. It has many health benefits, such as:  Improving your overall fitness, flexibility, and endurance.  Increasing your bone density.  Helping with weight control.  Decreasing your body fat.  Increasing your muscle strength.  Reducing stress and tension.  Improving your overall health. People with diabetes who exercise gain additional benefits because exercise:  Reduces appetite.  Improves the body's use of blood sugar (glucose).  Helps lower or control blood glucose.  Decreases blood pressure.  Helps control blood lipids (such as cholesterol and triglycerides).  Improves the body's use of the hormone insulin by:  Increasing the body's insulin sensitivity.  Reducing the body's insulin needs.  Decreases the risk for heart disease because exercising:  Lowers cholesterol and triglycerides levels.  Increases the levels of good cholesterol (such as high-density lipoproteins [HDL]) in the body.  Lowers blood glucose levels. YOUR ACTIVITY PLAN  Choose an activity that you enjoy, and set realistic goals. To exercise safely, you should begin practicing any new physical activity slowly, and gradually increase the intensity of the exercise over time. Your health care provider or diabetes educator can help create an activity plan that works for you. General recommendations include:  Encouraging children to engage in at least 60 minutes of physical activity each day.  Stretching and performing strength training exercises, such as yoga or weight lifting, at least 2 times per week.  Performing a total of at least 150 minutes of moderate-intensity exercise each  week, such as brisk walking or water aerobics.  Exercising at least 3 days per week, making sure you allow no more than 2 consecutive days to pass without exercising.  Avoiding long periods of inactivity (90 minutes or more). When you have to spend an extended period of time sitting down, take frequent breaks to walk or stretch. RECOMMENDATIONS FOR EXERCISING WITH TYPE 1 OR TYPE 2 DIABETES   Check your blood glucose before exercising. If blood glucose levels are greater than 240 mg/dL, check for urine ketones. Do not exercise if ketones are present.  Avoid injecting insulin into areas of the body that are going to be exercised. For example, avoid injecting insulin into:  The arms when playing tennis.  The legs when jogging.  Keep a record of:  Food intake before and after you exercise.  Expected peak times of insulin action.  Blood glucose levels before and after you exercise.  The type and amount of exercise you have done.  Review your records with your health care provider. Your health care provider will help you to develop guidelines for adjusting food intake and insulin amounts before and after exercising.  If you take insulin or oral hypoglycemic agents, watch for signs and symptoms of hypoglycemia. They include:  Dizziness.  Shaking.  Sweating.  Chills.  Confusion.  Drink plenty of water while you exercise to prevent dehydration or heat stroke. Body water is lost during exercise and must be replaced.  Talk to your health care provider before starting an exercise program to make sure it is safe for you. Remember, almost any type of activity is better than none.   This information is not intended to replace advice given to you by your health  care provider. Make sure you discuss any questions you have with your health care provider.   Document Released: 03/02/2004 Document Revised: 04/27/2015 Document Reviewed: 05/20/2013 Elsevier Interactive Patient Education [employer]

## 2016-08-21 NOTE — Assessment & Plan Note (Signed)
BP well-controlled. Asymptomatic. We'll check urine microalbumin today.

## 2016-08-21 NOTE — Addendum Note (Signed)
Addended by: Rockwell Germany on: 08/21/2016 02:10 PM   Modules accepted: Orders

## 2016-08-21 NOTE — Assessment & Plan Note (Signed)
Continue current medication regimen. Will obtain fasting lipid panel today.

## 2016-08-25 ENCOUNTER — Other Ambulatory Visit: Payer: Self-pay | Admitting: Physician Assistant

## 2016-09-15 ENCOUNTER — Emergency Department (HOSPITAL_BASED_OUTPATIENT_CLINIC_OR_DEPARTMENT_OTHER): Payer: BLUE CROSS/BLUE SHIELD

## 2016-09-15 ENCOUNTER — Encounter (HOSPITAL_BASED_OUTPATIENT_CLINIC_OR_DEPARTMENT_OTHER): Payer: Self-pay

## 2016-09-15 ENCOUNTER — Emergency Department (HOSPITAL_BASED_OUTPATIENT_CLINIC_OR_DEPARTMENT_OTHER)
Admission: EM | Admit: 2016-09-15 | Discharge: 2016-09-15 | Disposition: A | Discharge status: Home / Self Care | Payer: BLUE CROSS/BLUE SHIELD | Attending: Emergency Medicine | Admitting: Emergency Medicine

## 2016-09-15 DIAGNOSIS — X58XXXA Exposure to other specified factors, initial encounter: Secondary | ICD-10-CM | POA: Insufficient documentation

## 2016-09-15 DIAGNOSIS — Y929 Unspecified place or not applicable: Secondary | ICD-10-CM | POA: Insufficient documentation

## 2016-09-15 DIAGNOSIS — I10 Essential (primary) hypertension: Secondary | ICD-10-CM | POA: Diagnosis not present

## 2016-09-15 DIAGNOSIS — Y939 Activity, unspecified: Secondary | ICD-10-CM | POA: Insufficient documentation

## 2016-09-15 DIAGNOSIS — Z7984 Long term (current) use of oral hypoglycemic drugs: Secondary | ICD-10-CM | POA: Diagnosis not present

## 2016-09-15 DIAGNOSIS — S8011XA Contusion of right lower leg, initial encounter: Secondary | ICD-10-CM | POA: Insufficient documentation

## 2016-09-15 DIAGNOSIS — E119 Type 2 diabetes mellitus without complications: Secondary | ICD-10-CM | POA: Insufficient documentation

## 2016-09-15 DIAGNOSIS — T148XXA Other injury of unspecified body region, initial encounter: Secondary | ICD-10-CM

## 2016-09-15 DIAGNOSIS — Y999 Unspecified external cause status: Secondary | ICD-10-CM | POA: Diagnosis not present

## 2016-09-15 NOTE — ED Provider Notes (Signed)
[facility] DEPT [facility] Provider Note   CSN: [id number] Arrival date & time: 09/15/16  1554  By signing my name below, I, Ian [name], attest that this documentation has been prepared under the direction and in the presence of Montine Circle, PA-C Electronically Signed: [place], ED Scribe. 09/15/16. 5:00 PM.   History   Chief Complaint Chief Complaint  Patient presents with  . Leg Swelling    HPI Ian [name] is a 55 y.o. male with a PMHx of DVT, PE, HTN, hyperlipidemia, leg cramps, DM, who presents to the Emergency Department complaining of dent to right shin onset 1 hour ago. Pt notes that he noticed an indentation to his right shin that is not typically there. 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 Pt notes that he attempted to be seen by his PCP today but there weren't any available appointments. Pt is having associated symptoms of sores to right shin and mild bruising to right shin. He notes that he has not tried any medications for the relief of his symptoms. He denies swelling, gait problem, and any other symptoms.    The history is provided by the patient and the spouse. No language interpreter was used.    Past Medical History:  Diagnosis Date  . Allergy   . Arthritis   . Biceps tendon rupture    bilateral  . Clostridium difficile infection 2014  . Colon polyps   . Diabetes ([place])    Borderline/Pre-diabetes  . GERD (gastroesophageal reflux disease)   . Hyperlipidemia   . Hypertension   . Internal bleeding hemorrhoids 2012   "might bleed once/month; only when I strain"  . Internal prolapsed hemorrhoids   . Leg cramps   . Testicular hypofunction     Patient Active Problem List   Diagnosis Date Noted  . Pulmonary embolism, bilateral ([place])   . Lower leg DVT (deep venous thromboembolism), acute ([place])   . DVT (deep vein thrombosis) in pregnancy 05/06/2015  . Spinal stenosis of lumbar region at multiple levels 01/20/2015  . Screening,  ischemic heart disease 01/03/2015  . Hypertriglyceridemia 08/17/2014  . Obesity 08/17/2014  . Type 2 diabetes mellitus without complication ([place]) 08/65/7846  . Essential hypertension, benign 09/13/2013  . Thrombocytopenia ([place]) 09/13/2013  . Hyperlipidemia   . Testicular hypofunction   . Biceps tendon rupture   . GERD (gastroesophageal reflux disease)   . Colon polyps     Past Surgical History:  Procedure Laterality Date  . ANAL FISSURE REPAIR  06/24/2013  . APPENDECTOMY  2011  . COLONOSCOPY W/ BIOPSIES AND POLYPECTOMY  2012  . COLONSCOPY  2015  . EXCISIONAL HEMORRHOIDECTOMY    . FISSURECTOMY    . HEMORRHOID SURGERY  06/24/2013  . internal sphincterotomy  06-24-2013  . LUMBAR LAMINECTOMY/DECOMPRESSION MICRODISCECTOMY N/A 01/20/2015   Procedure: MICRO LUMBAR DECOMPRESSION L4-5/L3-4/L2-3;  Surgeon: Johnn Hai, MD;  Location: [facility];  Service: Orthopedics;  Laterality: N/A;  . SHOULDER ARTHROSCOPY  08/22/2012   Procedure: ARTHROSCOPY SHOULDER;  Surgeon: Marin Shutter, MD;  Location: [facility];  Service: Orthopedics;  Laterality: Left;  Left shoulder arthroscopic lavage and debridement  . SHOULDER ARTHROSCOPY  09/02/2012   Procedure: ARTHROSCOPY SHOULDER;  Surgeon: Marin Shutter, MD;  Location: [facility];  Service: Orthopedics;  Laterality: Left;  Left shoulder arthroscopy irrigation and debridement  . SHOULDER ARTHROSCOPY W/ ROTATOR CUFF REPAIR  07/09/2012   left  . SKIN LESION EXCISION     Benign  . WISDOM TOOTH  EXTRACTION  YRS AGO       Home Medications    Prior to Admission medications   Medication Sig Start Date End Date Taking? Authorizing Provider  ALLERGIST [name] See admin instructions. 08/16/15   Historical Provider, MD  Blood Glucose Monitoring Suppl (RELION CONFIRM GLUCOSE MONITOR) W/DEVICE KIT USE AS DIRECTED TO TEST BLOOD GLUCOSE DAILY DX: 250.00 08/13/14   Brunetta Jeans, PA-C  canagliflozin [facility]) 100 MG TABS tablet Take 2  tablets (200 mg total) by mouth daily before breakfast. 08/07/16   Brunetta Jeans, PA-C  Cholecalciferol (VITAMIN D-3) 5000 UNITS TABS Take 1 tablet by mouth daily.    Historical Provider, MD  [name] Take 25 mg by mouth daily.    Historical Provider, MD  docusate sodium (COLACE) 100 MG capsule Take 100 mg by mouth daily.     Historical Provider, MD  EPIPEN 2-PAK 0.3 MG/0.3ML SOAJ injection Inject 0.3 mg into the skin as needed (allergies).  04/19/15   Historical Provider, MD  fenofibrate (TRICOR) 145 MG tablet Take 1 tablet (145 mg total) by mouth daily. 07/17/16   Thayer Headings, MD  hydrochlorothiazide (HYDRODIURIL) 25 MG tablet Take 1 tablet (25 mg total) by mouth daily. 05/12/16   Brunetta Jeans, PA-C  HYDROcodone-acetaminophen (NORCO/VICODIN) 5-325 MG tablet Take 1 tablet by mouth as needed for pain. 07/20/13   Historical Provider, MD  Icosapent Ethyl 1 g CAPS Take 2 g by mouth 2 (two) times daily. 04/14/16   Brunetta Jeans, PA-C  LIVALO 4 MG TABS TAKE 1 TABLET (4 MG TOTAL) BY MOUTH DAILY. 05/07/16   Brunetta Jeans, PA-C  methocarbamol (ROBAXIN) 500 MG tablet Take 1 tablet (500 mg total) by mouth every 8 (eight) hours as needed for muscle spasms. 04/11/16   Brunetta Jeans, PA-C  omeprazole (PRILOSEC) 20 MG capsule TAKE 1 CAPSULE (20 MG TOTAL) BY MOUTH DAILY. 08/25/16   Brunetta Jeans, PA-C  [name] Take 1 tablet by mouth daily. Reported on 01/11/2016    Historical Provider, MD  rivaroxaban (XARELTO) 10 MG TABS tablet Take 10 mg by mouth daily.    Brunetta Jeans, PA-C  zolpidem (AMBIEN) 5 MG tablet TAKE 1 TABLET BY MOUTH AT BEDTIME AS NEEDED FOR SLEEP 06/20/16   Mosie Lukes, MD    Family History Family History  Problem Relation Age of Onset  . Heart disease Mother 28    Deceased  . Hyperlipidemia Mother   . Hypertension Mother   . Heart disease Father 15    Deceased  . CVA Father   . Hypertension Maternal Grandmother   . Hyperlipidemia Maternal  Grandmother   . Cancer Maternal Grandmother     Colon Cancer  . Heart disease Maternal Grandfather   . Hypertension Maternal Grandfather   . Hyperlipidemia Maternal Grandfather   . Cancer Maternal Grandfather     Lung Cancer  . Heart attack Maternal Grandfather   . Heart disease Paternal Grandfather     Social History Social History  Substance Use Topics  . Smoking status: Never Smoker  . Smokeless tobacco: Never Used     Comment: NEVER USED TOBACCO  . Alcohol use No     Allergies   Tetanus toxoids; Daptomycin; Citrus; Milk-related compounds; Oxycodone; Asa [aspirin]; Penicillins; and Prednisone (pak) [prednisone]   Review of Systems Review of Systems  All other systems reviewed and are negative.    Physical Exam  Updated Vital Signs BP 132/97 (BP Location: Left Arm)   Pulse 79   Temp 98.5 F (36.9 C) (Oral)   Resp 18   Ht 5' 8"  (1.727 m)   Wt 247 lb (112 kg)   SpO2 96%   BMI 37.56 kg/m   Physical Exam   Physical Exam  Constitutional: Pt appears well-developed and well-nourished. No distress.  HENT:  Head: Normocephalic and atraumatic.  Eyes: Conjunctivae are normal.  Neck: Normal range of motion.  Cardiovascular: Normal rate, regular rhythm and intact distal pulses.   Capillary refill < 3 sec  Pulmonary/Chest: Effort normal and breath sounds normal.  Musculoskeletal: Right shin: Pt exhibits no tenderness, but has mild contusion and indentation of skin at right distal tibia. Pt exhibits no edema.  ROM: 5/5  Neurological: Pt  is alert. Coordination normal.  Sensation 5/5 Strength 5/5  Skin: Skin is warm and dry. Pt is not diaphoretic.  No tenting of the skin  Psychiatric: Pt has a normal mood and affect.  Nursing note and vitals reviewed.    ED Treatments / Results  DIAGNOSTIC STUDIES: Oxygen Saturation is 96% on RA, nl by my interpretation.    COORDINATION OF CARE: 4:44 PM Discussed treatment plan with pt at bedside which includes right tib/fib  xray, and pt agreed to plan.   Radiology Dg Tibia/fibula Right  Result Date: 09/15/2016 CLINICAL DATA:  Contusion to anterior aspect of right tibia. No known injury. EXAM: RIGHT TIBIA AND FIBULA - 2 VIEW COMPARISON:  02/25/2013 FINDINGS: There is no evidence of fracture or other acute bone lesions. Calcaneal spur partially visualized. Small marginal spurs from the patella and about the lateral compartment of the knee. Soft tissues are unremarkable. IMPRESSION: 1. Negative for fracture or other acute bone abnormality. 2. Patellofemoral and lateral compartment degenerative spurring 3. Calcaneal spur Electronically Signed   By: Lucrezia Europe M.D.   On: 09/15/2016 17:20    Procedures Procedures (including critical care time)  Medications Ordered in ED Medications - No data to display   Initial Impression / Assessment and Plan / ED Course  I have reviewed the triage vital signs and the nursing notes.  Pertinent imaging results that were available during my care of the patient were reviewed by me and considered in my medical decision making (see chart for details).  Clinical Course    Patient X-Ray negative for obvious fracture or dislocation or other acute abnormality.  Pt advised to follow up with orthopedics/PCP. Conservative therapy recommended and discussed. Patient will be discharged home & is agreeable with above plan. Returns precautions discussed. Pt appears safe for discharge.   Final Clinical Impressions(s) / ED Diagnoses   Final diagnoses:  Contusion    New Prescriptions New Prescriptions   No medications on file   I personally performed the services described in this documentation, which was scribed in my presence. The recorded information has been reviewed and is accurate.      Montine Circle, PA-C 09/15/16 [place], MD 09/16/16 [phone]

## 2016-09-15 NOTE — ED Triage Notes (Signed)
Denies pain in leg but reports there is a dent in shin.  There are sores around that area that appears to be healing.  Has hx of DVT

## 2016-09-15 NOTE — ED Notes (Signed)
Patient transported to X-ray 

## 2016-09-22 ENCOUNTER — Encounter: Payer: Self-pay | Admitting: Family Medicine

## 2016-09-22 ENCOUNTER — Ambulatory Visit (INDEPENDENT_AMBULATORY_CARE_PROVIDER_SITE_OTHER): Payer: BLUE CROSS/BLUE SHIELD | Admitting: Family Medicine

## 2016-09-22 VITALS — BP 106/68 | HR 71 | Temp 98.2°F | Ht 68.0 in | Wt 245.2 lb

## 2016-09-22 DIAGNOSIS — R197 Diarrhea, unspecified: Secondary | ICD-10-CM

## 2016-09-22 NOTE — Patient Instructions (Addendum)
Stay well hydrated. Try to keep eating. Sports drinks/Pedialyte are ideal if you can tolerate it.

## 2016-09-22 NOTE — Progress Notes (Signed)
Chief Complaint  Patient presents with  . stomach issues    diarrhea and loose stools-started on Sat    Ian [name] is here for diarrhea and loose stools.  Duration: 6 days- happens in the AM and then subsides for the rest of the day; increased frequency and volume Bleeding? No Associated symptoms: nausea, not bad Denies: fever, vomiting and inability to keep down fluids Treatment to date: Immodium   ROS: Constitutional: No fevers GI: No N/V/C, no bleeding or pain  Past Medical History:  Diagnosis Date  . Allergy   . Arthritis   . Biceps tendon rupture    bilateral  . Clostridium difficile infection 2014  . Colon polyps   . Diabetes ([place])    Borderline/Pre-diabetes  . GERD (gastroesophageal reflux disease)   . Hyperlipidemia   . Hypertension   . Internal bleeding hemorrhoids 2012   "might bleed once/month; only when I strain"  . Internal prolapsed hemorrhoids   . Leg cramps   . Testicular hypofunction    Family History  Problem Relation Age of Onset  . Heart disease Mother 35    Deceased  . Hyperlipidemia Mother   . Hypertension Mother   . Heart disease Father 89    Deceased  . CVA Father   . Hypertension Maternal Grandmother   . Hyperlipidemia Maternal Grandmother   . Cancer Maternal Grandmother     Colon Cancer  . Heart disease Maternal Grandfather   . Hypertension Maternal Grandfather   . Hyperlipidemia Maternal Grandfather   . Cancer Maternal Grandfather     Lung Cancer  . Heart attack Maternal Grandfather   . Heart disease Paternal Grandfather    Past Surgical History:  Procedure Laterality Date  . ANAL FISSURE REPAIR  06/24/2013  . APPENDECTOMY  2011  . COLONOSCOPY W/ BIOPSIES AND POLYPECTOMY  2012  . COLONSCOPY  2015  . EXCISIONAL HEMORRHOIDECTOMY    . FISSURECTOMY    . HEMORRHOID SURGERY  06/24/2013  . internal sphincterotomy  06-24-2013  . LUMBAR LAMINECTOMY/DECOMPRESSION MICRODISCECTOMY N/A 01/20/2015   Procedure: MICRO LUMBAR  DECOMPRESSION L4-5/L3-4/L2-3;  Surgeon: Johnn Hai, MD;  Location: [facility];  Service: Orthopedics;  Laterality: N/A;  . SHOULDER ARTHROSCOPY  08/22/2012   Procedure: ARTHROSCOPY SHOULDER;  Surgeon: Marin Shutter, MD;  Location: [place] [facility];  Service: Orthopedics;  Laterality: Left;  Left shoulder arthroscopic lavage and debridement  . SHOULDER ARTHROSCOPY  09/02/2012   Procedure: ARTHROSCOPY SHOULDER;  Surgeon: Marin Shutter, MD;  Location: [place];  Service: Orthopedics;  Laterality: Left;  Left shoulder arthroscopy irrigation and debridement  . SHOULDER ARTHROSCOPY W/ ROTATOR CUFF REPAIR  07/09/2012   left  . SKIN LESION EXCISION     Benign  . WISDOM TOOTH EXTRACTION  YRS AGO    BP 106/68 (BP Location: Left Arm, Patient Position: Sitting, Cuff Size: Large)   Pulse 71   Temp 98.2 F (36.8 C) (Oral)   Ht 5\' 8"  (1.727 m)   Wt 245 lb 3.2 oz (111.2 kg)   SpO2 98%   BMI 37.28 kg/m  Gen.: Awake, alert, appears stated age 55: His membranes moist without mucosal lesions Heart: Regular rate and rhythm without murmurs Lungs: Clear auscultation bilaterally, no rales or wheezing, normal effort without accessory muscle use. Abdomen: Bowel sounds are present. Abdomen is soft, nontender, nondistended, no masses or organomegaly. Negative Murphy's, Rovsing's, McBurney's, and Carnett's sign. Psych: Age appropriate judgment and insight. Normal mood and affect.  Diarrhea, unspecified type - Plan: Ova  and parasite examination, Stool Culture  Orders as above. Encouraged him to stay well-hydrated. F/u Pending the above. Pt voiced understanding and agreement to the plan.  [place], DO 09/22/16 2:52 PM

## 2016-09-22 NOTE — Progress Notes (Signed)
Pre visit review using our clinic review tool, if applicable. No additional management support is needed unless otherwise documented below in the visit note. 

## 2016-09-25 LAB — HM DIABETES EYE EXAM

## 2016-10-01 ENCOUNTER — Other Ambulatory Visit: Payer: Self-pay | Admitting: Physician Assistant

## 2016-10-02 NOTE — Telephone Encounter (Signed)
Rx request to pharmacy/SLS  

## 2016-10-05 ENCOUNTER — Other Ambulatory Visit: Payer: Self-pay | Admitting: Physician Assistant

## 2016-10-10 ENCOUNTER — Other Ambulatory Visit: Payer: Self-pay | Admitting: Physician Assistant

## 2016-10-14 ENCOUNTER — Other Ambulatory Visit: Payer: Self-pay | Admitting: Physician Assistant

## 2016-10-16 ENCOUNTER — Other Ambulatory Visit: Payer: BLUE CROSS/BLUE SHIELD

## 2016-10-16 NOTE — Telephone Encounter (Signed)
eScribe request from [facility] for refill on Xarelto Last filled - Rx is Historical Only in EMR - Px by Thayer Headings, MD on 7/17/201 Last AEX - 08/21/16 [09/22/16 Acute Only] Please Advise on refills/SLS 10/23

## 2016-10-17 ENCOUNTER — Ambulatory Visit: Payer: BLUE CROSS/BLUE SHIELD

## 2016-10-23 ENCOUNTER — Ambulatory Visit (INDEPENDENT_AMBULATORY_CARE_PROVIDER_SITE_OTHER): Payer: BLUE CROSS/BLUE SHIELD | Admitting: Physician Assistant

## 2016-10-23 ENCOUNTER — Ambulatory Visit: Payer: BLUE CROSS/BLUE SHIELD | Admitting: Physician Assistant

## 2016-10-23 ENCOUNTER — Telehealth: Payer: Self-pay | Admitting: Physician Assistant

## 2016-10-23 ENCOUNTER — Encounter: Payer: Self-pay | Admitting: Physician Assistant

## 2016-10-23 DIAGNOSIS — E119 Type 2 diabetes mellitus without complications: Secondary | ICD-10-CM

## 2016-10-23 MED ORDER — ZOLPIDEM TARTRATE 5 MG PO TABS: 5.0000 mg | ORAL_TABLET | Freq: Every evening | ORAL | 0 refills | Status: DC | PRN

## 2016-10-23 NOTE — Telephone Encounter (Signed)
I don't rx Ambien chronically. OK to establish as long he he knows and is OK with this. Thanks.

## 2016-10-23 NOTE — Progress Notes (Signed)
Patient presents to clinic today for follow-up and to discuss transition of care once I leave the [facility] location. Patient will be staying at [facility] location as the new office is unfortunately too far of a drive for him. Is wanting to discuss other providers he can establish with.  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 Patient is currently on a regimen of Invokana 200 mg daily. Is taking as directed without side effect. Is checking fasting sugars at home. Averaging 90-110 most days. Occasionally 120s. Is working hard on exercise. Is consistent with diet. Eye exam and immunizations are up to date.  Past Medical History:  Diagnosis Date  . Allergy   . Arthritis   . Biceps tendon rupture    bilateral  . Clostridium difficile infection 2014  . Colon polyps   . Diabetes ([place])    Borderline/Pre-diabetes  . GERD (gastroesophageal reflux disease)   . Hyperlipidemia   . Hypertension   . Internal bleeding hemorrhoids 2012   "might bleed once/month; only when I strain"  . Internal prolapsed hemorrhoids   . Leg cramps   . Testicular hypofunction     Current Outpatient Prescriptions on File Prior to Visit  Medication Sig Dispense Refill  . ALLERGIST [name] See admin instructions.  6  . Blood Glucose Monitoring Suppl (RELION CONFIRM GLUCOSE MONITOR) W/DEVICE KIT USE AS DIRECTED TO TEST BLOOD GLUCOSE DAILY DX: 250.00 1 kit 0  . Cholecalciferol (VITAMIN D-3) 5000 UNITS TABS Take 1 tablet by mouth daily.    Marland Kitchen [name] Take 25 mg by mouth daily.    Marland Kitchen docusate sodium (COLACE) 100 MG capsule Take 100 mg by mouth daily.     Marland Kitchen EPIPEN 2-PAK 0.3 MG/0.3ML SOAJ injection Inject 0.3 mg into the skin as needed (allergies).     . fenofibrate (TRICOR) 145 MG tablet Take 1 tablet (145 mg total) by mouth daily. 30 tablet 11  . hydrochlorothiazide (HYDRODIURIL) 25 MG tablet Take 1 tablet (25 mg total) by mouth daily. 90 tablet 1  .  HYDROcodone-acetaminophen (NORCO/VICODIN) 5-325 MG tablet Take 1 tablet by mouth as needed for pain.    . [name] TAKE 2 TABLETS (200 MG TOTAL) BY MOUTH DAILY BEFORE BREAKFAST. 60 tablet 1  . LIVALO 4 MG TABS TAKE 1 TABLET (4 MG TOTAL) BY MOUTH DAILY. 30 tablet 5  . methocarbamol (ROBAXIN) 500 MG tablet Take 1 tablet (500 mg total) by mouth every 8 (eight) hours as needed for muscle spasms. 90 tablet 1  . omeprazole (PRILOSEC) 20 MG capsule TAKE 1 CAPSULE (20 MG TOTAL) BY MOUTH DAILY. 90 capsule 1  . [name] Take 1 tablet by mouth daily. Reported on 01/11/2016    . rivaroxaban (XARELTO) 10 MG TABS tablet Take 10 mg by mouth daily.    Marland Kitchen VASCEPA 1 g CAPS TAKE 2 CAPSULES BY MOUTH TWICE A DAY AS DIRECTED 120 capsule 3   No current facility-administered medications on file prior to visit.     Allergies  Allergen Reactions  . Tetanus Toxoids Anaphylaxis  . Daptomycin Other (See Comments)    myalgias  . Citrus Diarrhea    All citrus fruits  . Milk-Related Compounds Diarrhea    Milk and milk products  . Oxycodone   . Asa [Aspirin] Nausea Only and Rash  . Penicillins Rash  . Prednisone (Pak) [Prednisone] Other (See Comments)    Hyperactivity WITH ORAL,  CAN TAKE SHOTS-Cannot do Dose pack    Family History  Problem Relation Age of Onset  . Heart disease Mother 30    Deceased  . Hyperlipidemia Mother   . Hypertension Mother   . Heart disease Father 57    Deceased  . CVA Father   . Hypertension Maternal Grandmother   . Hyperlipidemia Maternal Grandmother   . Cancer Maternal Grandmother     Colon Cancer  . Heart disease Maternal Grandfather   . Hypertension Maternal Grandfather   . Hyperlipidemia Maternal Grandfather   . Cancer Maternal Grandfather     Lung Cancer  . Heart attack Maternal Grandfather   . Heart disease Paternal Grandfather     Social History   Social History  . Marital status: Married    Spouse name: N/A  . Number of children:  N/A  . Years of education: N/A   Social History Main Topics  . Smoking status: Never Smoker  . Smokeless tobacco: Never Used     Comment: NEVER USED TOBACCO  . Alcohol use No  . Drug use: No  . Sexual activity: Not Currently   Other Topics Concern  . None   Social History Narrative   Marital Status: Married Pamala Hurry)    Children:  Step Daughter Sharyn Lull)    Pets: None    Living Situation: Lives with Pamala Hurry    Occupation: [employer] ([place])    Education: Associate's Degree in [employer]    Tobacco Use/Exposure:  None    Alcohol Use:  Rarely    Drug Use:  None   Diet:  Regular   Exercise: Limited     Hobbies:  Hunting, Fishing              Review of Systems - See HPI.  All other ROS are negative.  BP 116/84 (BP Location: Left Arm, Patient Position: Sitting, Cuff Size: Large)   Pulse 75   Temp 98.3 F (36.8 C) (Oral)   Resp 16   Ht 5' 8"  (1.727 m)   Wt 239 lb 2 oz (108.5 kg)   SpO2 98%   BMI 36.36 kg/m   Physical Exam  Constitutional: He is oriented to person, place, and time and well-developed, well-nourished, and in no distress.  HENT:  Head: Normocephalic and atraumatic.  Eyes: Conjunctivae are normal.  Cardiovascular: Normal rate, regular rhythm, normal heart sounds and intact distal pulses.   Pulmonary/Chest: Effort normal.  Neurological: He is alert and oriented to person, place, and time.  Skin: Skin is warm and dry. No rash noted.  Psychiatric: Affect normal.  Vitals reviewed.   Recent Results (from the past 2160 hour(s))  Hemoglobin A1c     Status: Abnormal   Collection Time: 08/21/16  2:00 PM  Result Value Ref Range   Hgb A1c MFr Bld 6.6 (H) 4.6 - 6.5 %    Comment: Glycemic Control Guidelines for People with Diabetes:Non Diabetic:  <6%Goal of Therapy: <7%Additional Action Suggested:  >8%   Comp Met (CMET)     Status: Abnormal   Collection Time: 08/21/16  2:00 PM  Result Value Ref Range   Sodium 137 135 - 145 mEq/L   Potassium  3.3 (L) 3.5 - 5.1 mEq/L   Chloride 99 96 - 112 mEq/L   CO2 29 19 - 32 mEq/L   Glucose, Bld 96 70 - 99 mg/dL   BUN 19 6 - 23 mg/dL   Creatinine, Ser 1.13 0.40 - 1.50 mg/dL   Total  Bilirubin 0.9 0.2 - 1.2 mg/dL   Alkaline Phosphatase 35 (L) 39 - 117 U/L   AST 33 0 - 37 U/L   ALT 31 0 - 53 U/L   Total Protein 6.8 6.0 - 8.3 g/dL   Albumin 4.1 3.5 - 5.2 g/dL   Calcium 9.5 8.4 - 10.5 mg/dL   GFR 71.10 >60.00 mL/min  Lipid Profile     Status: Abnormal   Collection Time: 08/21/16  2:00 PM  Result Value Ref Range   Cholesterol 163 0 - 200 mg/dL    Comment: ATP III Classification       Desirable:  < 200 mg/dL               Borderline High:  200 - 239 mg/dL          High:  > = 240 mg/dL   Triglycerides (H) 0.0 - 149.0 mg/dL    545.0 Triglyceride is over 400; calculations on Lipids are invalid.    Comment: Normal:  <150 mg/dLBorderline High:  150 - 199 mg/dL   HDL 27.30 (L) >39.00 mg/dL   Total CHOL/HDL Ratio 6     Comment:                Men          Women1/2 Average Risk     3.4          3.3Average Risk          5.0          4.42X Average Risk          9.6          7.13X Average Risk          15.0          11.0                      Urine Microalbumin w/creat. ratio     Status: None   Collection Time: 08/21/16  2:00 PM  Result Value Ref Range   Microalb, Ur <0.7 0.0 - 1.9 mg/dL   Creatinine,U 104.2 mg/dL   Microalb Creat Ratio 0.7 0.0 - 30.0 mg/g  LDL cholesterol, direct     Status: None   Collection Time: 08/21/16  2:00 PM  Result Value Ref Range   Direct LDL 35.0 mg/dL    Comment: Optimal:  <100 mg/dLNear or Above Optimal:  100-129 mg/dLBorderline High:  130-159 mg/dLHigh:  160-189 mg/dLVery High:  >190 mg/dL  HM DIABETES EYE EXAM     Status: None   Collection Time: 09/25/16 12:00 AM  Result Value Ref Range   HM Diabetic Eye Exam No Retinopathy No Retinopathy    Assessment/Plan: Type 2 diabetes mellitus without complication Fasting sugars are looking great. At current goal.   Lab  Results  Component Value Date   HGBA1C 6.6 (H) 08/21/2016   Due for repeat A1C in 01/2017. Continue current regimen.  Continue diet and exercise regimen.  FU scheduled. Patient has elected to see Dr. Nani Ravens.    Leeanne Rio, PA-C

## 2016-10-23 NOTE — Assessment & Plan Note (Signed)
Fasting sugars are looking great. At current goal.   Lab Results  Component Value Date   HGBA1C 6.6 (H) 08/21/2016   Due for repeat A1C in 01/2017. Continue current regimen.  Continue diet and exercise regimen.  FU scheduled. Patient has elected to see Dr. Nani Ravens.

## 2016-10-23 NOTE — Progress Notes (Signed)
Pre visit review using our clinic review tool, if applicable. No additional management support is needed unless otherwise documented below in the visit note/SLS  

## 2016-10-23 NOTE — Telephone Encounter (Signed)
Ok with me 

## 2016-10-23 NOTE — Patient Instructions (Signed)
Please continue all medications as directed. Your fasting sugars look great.  You are due for follow-up and repeat labs in February.  Return sooner if needed.  It has been a pleasure taking care of you these years. Dr. Nani Ravens will take wonderful care of you.

## 2016-10-23 NOTE — Telephone Encounter (Signed)
Patient would like to transfer to Dr. Nani Ravens from [place], please advise

## 2016-10-24 NOTE — Telephone Encounter (Signed)
Aware and will alert Dr. Nani Ravens.

## 2016-10-24 NOTE — Telephone Encounter (Signed)
Patient understands message and states that that is okay and he would like to continue care with Dr. Nani Ravens.

## 2016-10-24 NOTE — Telephone Encounter (Signed)
lvm informing patient to call back regarding message below

## 2016-10-30 ENCOUNTER — Ambulatory Visit (INDEPENDENT_AMBULATORY_CARE_PROVIDER_SITE_OTHER): Payer: BLUE CROSS/BLUE SHIELD | Admitting: Pharmacist

## 2016-10-30 ENCOUNTER — Other Ambulatory Visit (HOSPITAL_BASED_OUTPATIENT_CLINIC_OR_DEPARTMENT_OTHER): Payer: BLUE CROSS/BLUE SHIELD

## 2016-10-30 ENCOUNTER — Ambulatory Visit (HOSPITAL_BASED_OUTPATIENT_CLINIC_OR_DEPARTMENT_OTHER): Payer: BLUE CROSS/BLUE SHIELD | Admitting: Family

## 2016-10-30 VITALS — BP 124/77 | HR 64 | Temp 98.2°F | Resp 20 | Wt 240.8 lb

## 2016-10-30 DIAGNOSIS — Z7901 Long term (current) use of anticoagulants: Secondary | ICD-10-CM | POA: Diagnosis not present

## 2016-10-30 DIAGNOSIS — I2699 Other pulmonary embolism without acute cor pulmonale: Secondary | ICD-10-CM

## 2016-10-30 DIAGNOSIS — E785 Hyperlipidemia, unspecified: Secondary | ICD-10-CM

## 2016-10-30 DIAGNOSIS — I82512 Chronic embolism and thrombosis of left femoral vein: Secondary | ICD-10-CM | POA: Diagnosis not present

## 2016-10-30 DIAGNOSIS — Z86711 Personal history of pulmonary embolism: Secondary | ICD-10-CM | POA: Diagnosis not present

## 2016-10-30 DIAGNOSIS — I824Z2 Acute embolism and thrombosis of unspecified deep veins of left distal lower extremity: Secondary | ICD-10-CM

## 2016-10-30 LAB — CBC WITH DIFFERENTIAL (CANCER CENTER ONLY)
BASO#: 0.1 10*3/uL (ref 0.0–0.2)
BASO%: 0.9 % (ref 0.0–2.0)
EOS%: 3.9 % (ref 0.0–7.0)
Eosinophils Absolute: 0.2 10*3/uL (ref 0.0–0.5)
HCT: 46.1 % (ref 38.7–49.9)
HGB: 16.9 g/dL (ref 13.0–17.1)
LYMPH#: 1.8 10*3/uL (ref 0.9–3.3)
LYMPH%: 33.8 % (ref 14.0–48.0)
MCH: 32.6 pg (ref 28.0–33.4)
MCHC: 36.7 g/dL — ABNORMAL HIGH (ref 32.0–35.9)
MCV: 89 fL (ref 82–98)
MONO#: 0.7 10*3/uL (ref 0.1–0.9)
MONO%: 13.1 % — AB (ref 0.0–13.0)
NEUT#: 2.6 10*3/uL (ref 1.5–6.5)
NEUT%: 48.3 % (ref 40.0–80.0)
PLATELETS: 124 10*3/uL — AB (ref 145–400)
RBC: 5.19 10*6/uL (ref 4.20–5.70)
RDW: 13.9 % (ref 11.1–15.7)
WBC: 5.4 10*3/uL (ref 4.0–10.0)

## 2016-10-30 LAB — COMPREHENSIVE METABOLIC PANEL
ALT: 24 U/L (ref 0–55)
ANION GAP: 11 meq/L (ref 3–11)
AST: 29 U/L (ref 5–34)
Albumin: 3.6 g/dL (ref 3.5–5.0)
Alkaline Phosphatase: 64 U/L (ref 40–150)
BUN: 20 mg/dL (ref 7.0–26.0)
CHLORIDE: 107 meq/L (ref 98–109)
CO2: 23 meq/L (ref 22–29)
Calcium: 9.5 mg/dL (ref 8.4–10.4)
Creatinine: 1.1 mg/dL (ref 0.7–1.3)
EGFR: 77 mL/min/{1.73_m2} — AB (ref >90)
Glucose: 109 mg/dl (ref 70–140)
Potassium: 3.9 mEq/L (ref 3.5–5.1)
Sodium: 141 mEq/L (ref 136–145)
Total Bilirubin: 0.53 mg/dL (ref 0.20–1.20)
Total Protein: 7.3 g/dL (ref 6.4–8.3)

## 2016-10-30 NOTE — Progress Notes (Signed)
Hematology and Oncology Follow Up Visit  Ian [name] [id number] [birth year] 55 y.o. 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 He is doing quite well. He has mae some healthy life choices with diet and exercise and has now lost 27 lbs. He is very happy with his progress and is feeling much better. He is staying well hydrated.  He has had no episodes of bleeding or bruising while on Xarelto.  No lymphadenopathy found on exam.  No fever, chills, n/v, cough, rash, dizziness, SOB, chest pain, palpitations, abdominal pain or changes in bowel or bladder habits.  He has no swelling or tenderness in his extremities. The neuropathy in several of his toes in the left foot is unchanged. He is still taking Ian B6.   Medications:    Medication List       Accurate as of 10/30/16 11:20 AM. Always use your most recent med list.          ALLERGIST Ian [name] Generic drug:  Tuberculin-Allergy Syringes See admin instructions.   Ian [name] Take 25 mg by mouth daily.   Ian [name] Commonly known as:  COLACE Take 100 mg by mouth daily.   Ian [name] Generic drug:  EPINEPHrine Inject 0.3 mg into the skin as needed (allergies).   Ian [name] Commonly known as:  TRICOR Take 1 [name] (145 mg total) by mouth daily.   Ian [name] Commonly known as:  HYDRODIURIL Take 1 [name] (25 mg total) by mouth daily.   Ian [name] Commonly known as:  NORCO/VICODIN Take 1 [name] by mouth as needed for pain.   Ian [name] Generic drug:  canagliflozin TAKE 2 TABLETS (200 MG TOTAL) BY MOUTH DAILY BEFORE BREAKFAST.   Ian [name] Generic drug:  Pitavastatin Calcium TAKE 1 [name] (4 MG TOTAL) BY MOUTH DAILY.   Ian [name] Commonly known as:  ROBAXIN Take 1 [name] (500 mg total) by mouth every 8 (eight) hours as needed for muscle spasms.   Ian [name] Commonly known as:  PRILOSEC TAKE 1 [name] (20 MG TOTAL) BY MOUTH DAILY.   Ian [name] Take 1 [name] by mouth daily. Reported on 01/11/2016   Ian [name] USE AS DIRECTED TO TEST BLOOD GLUCOSE DAILY DX: 250.00   Ian [name] Commonly known as:  XARELTO Take 10 mg by mouth daily.   Ian [name] Generic drug:  Icosapent Ethyl TAKE 2 CAPSULES BY MOUTH TWICE A DAY AS DIRECTED   Ian [name] Take 1 [name] by mouth daily.   Ian [name] Commonly known as:  AMBIEN Take 1 [name] (5 mg total) by mouth at bedtime as needed. for sleep       Allergies:  Allergies  Allergen Reactions  . Tetanus Toxoids Anaphylaxis  . Daptomycin Other (See Comments)    myalgias  . Citrus Diarrhea    All citrus fruits  . Milk-Related Compounds Diarrhea    Milk and milk products  . Oxycodone   . Asa [Aspirin] Nausea Only and Rash  . Penicillins Rash  .  Prednisone (Pak) [Prednisone] Other (See Comments)    Hyperactivity WITH ORAL, CAN TAKE SHOTS-Cannot do Dose pack    Past Medical History, Surgical history, Social history, and Family History were reviewed and updated.  Review of Systems: All other 10 point review of systems is negative.   Physical Exam:  weight is 240 lb 12.8 oz (109.2 kg). His oral temperature is 98.2 F (36.8 C). His blood pressure is 124/77 and his pulse is 64. His respiration is 20 and oxygen saturation is 99%.   Wt Readings from Last 3 Encounters:  10/30/16 240 lb 12.8 oz (109.2 kg)  10/23/16 239 lb 2 oz (108.5 kg)  09/22/16 245 lb 3.2 oz (111.2 kg)    Ocular: Sclerae unicteric, pupils equal, round and reactive  to light Ear-nose-throat: Oropharynx clear, dentition fair Lymphatic: No cervical supraclavicular or axillary adenopathy Lungs no rales or rhonchi, good excursion bilaterally Heart regular rate and rhythm, no murmur appreciated Abd soft, nontender, positive bowel sounds, no liver or spleen tip palpated on exam, no fluid wave MSK no focal spinal tenderness, no joint edema Neuro: non-focal, well-oriented, appropriate affect Breasts: Deferred  Lab Results  Component Value Date   WBC 5.4 10/30/2016   HGB 16.9 10/30/2016   HCT 46.1 10/30/2016   MCV 89 10/30/2016   PLT 124 (L) 10/30/2016   No results found for: FERRITIN, IRON, TIBC, UIBC, IRONPCTSAT Lab Results  Component Value Date   RBC 5.19 10/30/2016   No results found for: KPAFRELGTCHN, LAMBDASER, KAPLAMBRATIO No results found for: IGGSERUM, IGA, IGMSERUM No results found for: Ian [name], SPEI   Chemistry      Component Value Date/Time   NA 137 08/21/2016 1400   NA 138 05/01/2016 1031   K 3.3 (L) 08/21/2016 1400   K 3.6 05/01/2016 1031   CL 99 08/21/2016 1400   CL 101 08/23/2015 0758   CO2 29 08/21/2016 1400   CO2 18 (L) 05/01/2016 1031   BUN 19 08/21/2016 1400   BUN 12.9 05/01/2016 1031   CREATININE 1.13 08/21/2016 1400   CREATININE 1.1 05/01/2016 1031      Component Value Date/Time   CALCIUM 9.5 08/21/2016 1400   CALCIUM 10.0 05/01/2016 1031   ALKPHOS 35 (L) 08/21/2016 1400   ALKPHOS 70 05/01/2016 1031   AST 33 08/21/2016 1400   AST 34 05/01/2016 1031   ALT 31 08/21/2016 1400   ALT 39 05/01/2016 1031   BILITOT 0.9 08/21/2016 1400   BILITOT 0.59 05/01/2016 1031     Impression and Plan: Ian [name] is a 55 yo white male with thrombus in the left leg and bilateral PE's. PE's had resolved on CT angio in August 2016. 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 He developed his thrombus after having surgery and being immobile for an extended period of time.  He has had a nice response on Xarelto and is now on low dose at 10 mg [name] daily. He is allergic to aspirin so we can hopefully transition him off of Xarelto in May 2018 which will mark 2 years or treatment with anticoagulation.  We will continue to follow along with him and plan to see him back in 6 months for repeat lab work and follow-up.  He will contact us with any questions or concerns. We can certainly see him  sooner if need be.   Eliezer Bottom, NP 11/6/201711:20 AM

## 2016-10-30 NOTE — Progress Notes (Signed)
Patient ID: Ian [name]                 DOB: 06/23/1961                    MRN: [id number]     HPI: Ian [name] is a 55 y.o. male patient of Dr. Acie Fredrickson with [place] [facility] that presents today for lipid follow up.  PMH: HLD, elevated TG, DM, HTN, and history of PE following back surgery.    TG have been as high as 1700 previously, dropped to ~600 6 weeks later. Pt reported he ate less beef during this time and that is the only thing he did differently. He is intolerant to atorvastatin and rosuvastatin, currently tolerating pitavastatin. He takes fenofibrate but it causes constipation (~2% incidence). He has rechallenged and symptoms presented again. Does take hydrocodone as well but reports that he had symptoms when he was on the fenofibrate and not hydrocodone. Reports that he takes ~50% of his doses of fenofibrate. Intolerant to gemfibrozil and niacin. Does not drink alcohol and avoids sugary foods. Watches his carbs since he is diabetic.  In clinic today, the patient endorses compliance to fenofibrate and reports that constipation has resolved. He's also tolerating Vascepa 4g daily.   Current Medications: pitavastatin 7m daily, fenofibrate 1458mdaily, Vascepa 4g daily Intolerances: atorvastatin 10 and 2035maily, rosuvastatin - muscle aches with both, gemfibrozil 600m45mD, Zetia 10mg29mly, niacin - flushing, Welchol 625mg 54mbs BID TG goal: as close to 150 as possible  Diet: Eats greens, beef - 3x a week and has since cut out. Doesn't drink alcohol. Diabetic - knows carbohydrate sources well. Doesn't eat sweets. Focusing more on lean chicken. Eats 2 eggs most days a week.   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   Family History: Maternal grandfather with heart disease and MI, mother was a smoker with CAD, dad died from gallbladder complications.  Social History: Denies alcohol use, cigarette use, or illicit drug use.  Labs: 08/21/16  TC 163, TG 545, HDL 27.3, LDL- D  35, A1C 6.6% (Livalo 4mg, V46mepa 4g, fenofibrate 145mg) 562m17: TC 168, TG 617, HDL 25.9, LDL-D 26 (Livalo 4mg, Vas69ma 4g, fenofibrate 134mg but 4mrts eating less meat) 04/11/16: TC 289, TG 1702, HDL 21.6, LDL-D 36 (Livalo 4mg, Vasce37m4g, fenofibrate 134mg). A1c 53m  Past Medical History:  Diagnosis Date  . Allergy   . Arthritis   . Biceps tendon rupture    bilateral  . Clostridium difficile infection 2014  . Colon polyps   . Diabetes ([facility])    [place]/Pre-diabetes  . GERD (gastroesophageal reflux disease)   . Hyperlipidemia   . Hypertension   . Internal bleeding hemorrhoids 2012   "might bleed once/month; only when I strain"  . Internal prolapsed hemorrhoids   . Leg cramps   . Testicular hypofunction     Current Outpatient Prescriptions on File Prior to Visit  Medication Sig Dispense Refill  . ALLERGIST [name] See admin instructions.  6  . Blood Glucose Monitoring Suppl (RELION CONFIRM GLUCOSE MONITOR) W/DEVICE KIT USE AS DIRECTED TO TEST BLOOD GLUCOSE DAILY DX: 250.00 1 kit 0  . Cholecalciferol (VITAMIN D-3) 5000 UNITS TABS Take 1 tablet by mouth daily.    . CLOMIPHENEMarland KitchenCITRATE PO Take 25 mg by mouth daily.    . docusate sMarland Kitchendium (COLACE) 100 MG capsule Take 100 mg by mouth daily.     .Marland Kitchen  EPIPEN 2-PAK 0.3 MG/0.3ML SOAJ injection Inject 0.3 mg into the skin as needed (allergies).     . fenofibrate (TRICOR) 145 MG tablet Take 1 tablet (145 mg total) by mouth daily. 30 tablet 11  . hydrochlorothiazide (HYDRODIURIL) 25 MG tablet Take 1 tablet (25 mg total) by mouth daily. 90 tablet 1  . HYDROcodone-acetaminophen (NORCO/VICODIN) 5-325 MG tablet Take 1 tablet by mouth as needed for pain.    . [name] TAKE 2 TABLETS (200 MG TOTAL) BY MOUTH DAILY BEFORE BREAKFAST. 60 tablet 1  . LIVALO 4 MG TABS TAKE 1 TABLET (4 MG TOTAL) BY MOUTH DAILY. 30 tablet 5  . methocarbamol (ROBAXIN) 500 MG tablet Take 1 tablet (500 mg total) by mouth  every 8 (eight) hours as needed for muscle spasms. 90 tablet 1  . omeprazole (PRILOSEC) 20 MG capsule TAKE 1 CAPSULE (20 MG TOTAL) BY MOUTH DAILY. 90 capsule 1  . [name] Take 1 tablet by mouth daily. Reported on 01/11/2016    . rivaroxaban (XARELTO) 10 MG TABS tablet Take 10 mg by mouth daily.    Marland Kitchen VASCEPA 1 g CAPS TAKE 2 CAPSULES BY MOUTH TWICE A DAY AS DIRECTED 120 capsule 3  . zolpidem (AMBIEN) 5 MG tablet Take 1 tablet (5 mg total) by mouth at bedtime as needed. for sleep 30 tablet 0   No current facility-administered medications on file prior to visit.     Allergies  Allergen Reactions  . Tetanus Toxoids Anaphylaxis  . Daptomycin Other (See Comments)    myalgias  . Citrus Diarrhea    All citrus fruits  . Milk-Related Compounds Diarrhea    Milk and milk products  . Oxycodone   . Asa [Aspirin] Nausea Only and Rash  . Penicillins Rash  . Prednisone (Pak) [Prednisone] Other (See Comments)    Hyperactivity WITH ORAL, CAN TAKE SHOTS-Cannot do Dose pack    Assessment/Plan:  1. Hyperlipidemia: The patient is currently maximized on fenofibrate and Vascepa therapy to which he is tolerating well. He has lost 33 lbs with diet and exercise. Recommend that he continue with his current regimen and encouraged that he continue with his diet and exercise regimen. A follow-up lipid panel has been scheduled for 12/11/2016 and a follow-up clinic visit for 01/01/2017.  Thank you,  Lelan Pons. Patterson Hammersmith, [facility]  10/30/2016 7:54 AM

## 2016-10-30 NOTE — Patient Instructions (Signed)

## 2016-11-01 ENCOUNTER — Other Ambulatory Visit: Payer: Self-pay | Admitting: Physician Assistant

## 2016-11-06 DIAGNOSIS — Z9109 Other allergy status, other than to drugs and biological substances: Secondary | ICD-10-CM | POA: Insufficient documentation

## 2016-11-14 ENCOUNTER — Encounter: Payer: Self-pay | Admitting: Medical

## 2016-11-14 ENCOUNTER — Telehealth: Payer: Self-pay

## 2016-11-14 ENCOUNTER — Ambulatory Visit (INDEPENDENT_AMBULATORY_CARE_PROVIDER_SITE_OTHER): Payer: BLUE CROSS/BLUE SHIELD | Admitting: Medical

## 2016-11-14 VITALS — BP 124/70 | HR 75 | Temp 98.0°F | Ht 68.0 in | Wt 236.0 lb

## 2016-11-14 DIAGNOSIS — L089 Local infection of the skin and subcutaneous tissue, unspecified: Secondary | ICD-10-CM | POA: Diagnosis not present

## 2016-11-14 MED ORDER — DOXYCYCLINE HYCLATE 100 MG PO TABS: 100.0000 mg | ORAL_TABLET | Freq: Two times a day (BID) | ORAL | 0 refills | Status: DC

## 2016-11-14 MED FILL — DOXYCYCLINE HYCLATE 100 MG: 100 | 10 days supply | Qty: 20 | Fill #0

## 2016-11-14 NOTE — Progress Notes (Signed)
Pre visit review using our clinic review tool, if applicable. No additional management support is needed unless otherwise documented below in the visit note. 

## 2016-11-14 NOTE — Patient Instructions (Signed)
For your skin infection navel area can do warm salt water soaks twice daily.  Start doxycycline antibiotic. The wound culture is pending.  If area worsens or changes then ED evaluation over holidays.  Follow up in 5 days or as needed

## 2016-11-14 NOTE — Progress Notes (Signed)
Subjective:    Patient ID: Ian [name], male    DOB: 10-06-1961, 55 y.o.   MRN: [id number]  HPI  Pt had one week or more tender navel area. Pt states in past he has had infected naval area in the past years ago. He states smelt bad odor and then cleared area with qtip. He saw little blood tinged.  Pt has no fever, no chills or sweats.   Pt does have history of mrsa. Positive swab from his nose.  Review of Systems  Constitutional: Negative for chills and fatigue.  Respiratory: Negative for cough, chest tightness, shortness of breath and wheezing.   Cardiovascular: Negative for chest pain and palpitations.  Gastrointestinal: Negative for abdominal pain.  Musculoskeletal: Negative for back pain.  Skin:       Umbilical area pain. Pt has been using mupirocin.  Neurological: Negative for dizziness, light-headedness and headaches.  Hematological: Negative for adenopathy. Does not bruise/bleed easily.  Psychiatric/Behavioral: Negative for agitation and behavioral problems.    Past Medical History:  Diagnosis Date  . Allergy   . Arthritis   . Biceps tendon rupture    bilateral  . Clostridium difficile infection 2014  . Colon polyps   . Diabetes ([place])    Borderline/Pre-diabetes  . GERD (gastroesophageal reflux disease)   . Hyperlipidemia   . Hypertension   . Internal bleeding hemorrhoids 2012   "might bleed once/month; only when I strain"  . Internal prolapsed hemorrhoids   . Leg cramps   . Testicular hypofunction      Social History   Social History  . Marital status: Married    Spouse name: N/A  . Number of children: N/A  . Years of education: N/A   Occupational History  . Not on file.   Social History Main Topics  . Smoking status: Never Smoker  . Smokeless tobacco: Never Used     Comment: NEVER USED TOBACCO  . Alcohol use No  . Drug use: No  . Sexual activity: Not Currently   Other Topics Concern  . Not on file   Social History Narrative   Marital  Status: Married Pamala Hurry)    Children:  Step Daughter Sharyn Lull)    Pets: None    Living Situation: Lives with Pamala Hurry    Occupation: [employer] ([place])    Education: Associate's Degree in [employer]    Tobacco Use/Exposure:  None    Alcohol Use:  Rarely    Drug Use:  None   Diet:  Regular   Exercise: Limited     Hobbies:  Hunting, Fishing              Past Surgical History:  Procedure Laterality Date  . ANAL FISSURE REPAIR  06/24/2013  . APPENDECTOMY  2011  . COLONOSCOPY W/ BIOPSIES AND POLYPECTOMY  2012  . COLONSCOPY  2015  . EXCISIONAL HEMORRHOIDECTOMY    . FISSURECTOMY    . HEMORRHOID SURGERY  06/24/2013  . internal sphincterotomy  06-24-2013  . LUMBAR LAMINECTOMY/DECOMPRESSION MICRODISCECTOMY N/A 01/20/2015   Procedure: MICRO LUMBAR DECOMPRESSION L4-5/L3-4/L2-3;  Surgeon: Johnn Hai, MD;  Location: [facility];  Service: Orthopedics;  Laterality: N/A;  . SHOULDER ARTHROSCOPY  08/22/2012   Procedure: ARTHROSCOPY SHOULDER;  Surgeon: Marin Shutter, MD;  Location: [facility];  Service: Orthopedics;  Laterality: Left;  Left shoulder arthroscopic lavage and debridement  . SHOULDER ARTHROSCOPY  09/02/2012   Procedure: ARTHROSCOPY SHOULDER;  Surgeon: Marin Shutter, MD;  Location: [facility];  Service: Orthopedics;  Laterality: Left;  Left shoulder arthroscopy irrigation and debridement  . SHOULDER ARTHROSCOPY W/ ROTATOR CUFF REPAIR  07/09/2012   left  . SKIN LESION EXCISION     Benign  . WISDOM TOOTH EXTRACTION  YRS AGO    Family History  Problem Relation Age of Onset  . Heart disease Mother 79    Deceased  . Hyperlipidemia Mother   . Hypertension Mother   . Heart disease Father 84    Deceased  . CVA Father   . Hypertension Maternal Grandmother   . Hyperlipidemia Maternal Grandmother   . Cancer Maternal Grandmother     Colon Cancer  . Heart disease Maternal Grandfather   . Hypertension Maternal Grandfather   . Hyperlipidemia Maternal Grandfather   . Cancer Maternal  Grandfather     Lung Cancer  . Heart attack Maternal Grandfather   . Heart disease Paternal Grandfather     Allergies  Allergen Reactions  . Tetanus Toxoids Anaphylaxis  . Daptomycin Other (See Comments)    myalgias  . Asa [Aspirin] Nausea Only and Rash  . Citrus Diarrhea    All citrus fruits  . Milk-Related Compounds Diarrhea    Milk and milk products  . Oxycodone   . Penicillins Rash  . Prednisone (Pak) [Prednisone] Other (See Comments)    Hyperactivity WITH ORAL, CAN TAKE SHOTS-Cannot do Dose pack    Current Outpatient Prescriptions on File Prior to Visit  Medication Sig Dispense Refill  . ALLERGIST [name] See admin instructions.  6  . Blood Glucose Monitoring Suppl (RELION CONFIRM GLUCOSE MONITOR) W/DEVICE KIT USE AS DIRECTED TO TEST BLOOD GLUCOSE DAILY DX: 250.00 1 kit 0  . Cholecalciferol (VITAMIN D-3) 5000 UNITS TABS Take 1 tablet by mouth daily.    Marland Kitchen [name] Take 25 mg by mouth daily.    Marland Kitchen docusate sodium (COLACE) 100 MG capsule Take 100 mg by mouth daily.     Marland Kitchen EPIPEN 2-PAK 0.3 MG/0.3ML SOAJ injection Inject 0.3 mg into the skin as needed (allergies).     . fenofibrate (TRICOR) 145 MG tablet Take 1 tablet (145 mg total) by mouth daily. 30 tablet 11  . hydrochlorothiazide (HYDRODIURIL) 25 MG tablet TAKE 1 TABLET (25 MG TOTAL) BY MOUTH DAILY. 90 tablet 1  . HYDROcodone-acetaminophen (NORCO/VICODIN) 5-325 MG tablet Take 1 tablet by mouth as needed for pain.    . [name] TAKE 2 TABLETS (200 MG TOTAL) BY MOUTH DAILY BEFORE BREAKFAST. 60 tablet 1  . LIVALO 4 MG TABS TAKE 1 TABLET (4 MG TOTAL) BY MOUTH DAILY. 30 tablet 5  . methocarbamol (ROBAXIN) 500 MG tablet Take 1 tablet (500 mg total) by mouth every 8 (eight) hours as needed for muscle spasms. 90 tablet 1  . omeprazole (PRILOSEC) 20 MG capsule TAKE 1 CAPSULE (20 MG TOTAL) BY MOUTH DAILY. 90 capsule 1  . [name] Take 1 tablet by mouth daily.  Reported on 01/11/2016    . rivaroxaban (XARELTO) 10 MG TABS tablet Take 10 mg by mouth daily.    Marland Kitchen VASCEPA 1 g CAPS TAKE 2 CAPSULES BY MOUTH TWICE A DAY AS DIRECTED 120 capsule 3  . zolpidem (AMBIEN) 5 MG tablet Take 1 tablet (5 mg total) by mouth at bedtime as needed. for sleep 30 tablet 0   No current facility-administered medications on file prior to visit.     BP 124/70 (BP Location: Left Arm,  Patient Position: Sitting, Cuff Size: Normal)   Pulse 75   Temp 98 F (36.7 C) (Oral)   Ht 5' 8" (1.727 m)   Wt 236 lb (107 kg)   SpO2 95%   BMI 35.88 kg/m      Objective:   Physical Exam   General Appearance- Not in acute distress.   Chest and Lung Exam Auscultation: Breath sounds:-Normal. CTA. Adventitious sounds:- No Adventitious sounds.  Cardiovascular Auscultation:Rythm - Regular. Heart Sounds -Normal heart sounds.  Abdomen Inspection:-Inspection Normal.  Palpation/Perucssion: Palpation and Percussion of the abdomen reveal-  Mild pink/ redness of umbilical  area/faint Tenderness(mild dry blood discharge/mild foul smell), No Rebound tenderness, No rigidity(Guarding) and No Palpable abdominal masses.(no abscess) Liver:-Normal.  Spleen:- Normal.   On straight leg lift and palpation he has possible very small defect in abdomen. ,maybe tiny small umbilical hernia.  Back-no cva pain.  .     Assessment & Plan:  For your skin infection navel area can do warm salt water soaks twice daily.  Start doxycycline antibiotic. The wound culture is pending.  If area worsens or changes then ED evaluation over holidays.  Follow up in 5 days or as needed

## 2016-11-14 NOTE — Telephone Encounter (Signed)
Rx for Doxycycline 100mg  cancelled at [facility] on [address],

## 2016-11-14 NOTE — Telephone Encounter (Signed)
Pt wanted to rx sent down stairs so will you cancel the [facility].

## 2016-11-15 ENCOUNTER — Encounter: Payer: Self-pay | Admitting: *Deleted

## 2016-11-15 NOTE — Telephone Encounter (Signed)
This encounter was created in error - please disregard.

## 2016-11-16 LAB — WOUND CULTURE
Gram Stain: NONE SEEN
Gram Stain: NONE SEEN
Organism ID, Bacteria: NORMAL

## 2016-11-20 ENCOUNTER — Ambulatory Visit (INDEPENDENT_AMBULATORY_CARE_PROVIDER_SITE_OTHER): Payer: BLUE CROSS/BLUE SHIELD | Admitting: Medical

## 2016-11-20 ENCOUNTER — Encounter: Payer: Self-pay | Admitting: Medical

## 2016-11-20 VITALS — BP 134/74 | HR 75 | Temp 98.1°F | Resp 18 | Ht 68.0 in | Wt 237.4 lb

## 2016-11-20 DIAGNOSIS — L089 Local infection of the skin and subcutaneous tissue, unspecified: Secondary | ICD-10-CM | POA: Diagnosis not present

## 2016-11-20 NOTE — Progress Notes (Signed)
Pre visit review using our clinic review tool, if applicable. No additional management support is needed unless otherwise documented below in the visit note. 

## 2016-11-20 NOTE — Progress Notes (Addendum)
Subjective:    Patient ID: Ian [name], male    DOB: 1961/01/11, 55 y.o.   MRN: [id number]  HPI  Pt in states no umbilical area pain, no yellow dc, no tenderness and no bloody discharge. No fever, no chills and no sweats.  Pt is mid way through the doxycycline. Pt 6 days on antibiotic. Tolerating the med well.   Review of Systems  Constitutional: Negative for chills, fatigue and fever.  Respiratory: Negative for cough, chest tightness, shortness of breath and wheezing.   Cardiovascular: Negative for chest pain and palpitations.  Skin:       See hpi. Skin umbilical area feels better.   Past Medical History:  Diagnosis Date  . Allergy   . Arthritis   . Biceps tendon rupture    bilateral  . Clostridium difficile infection 2014  . Colon polyps   . Diabetes ([place])    Borderline/Pre-diabetes  . GERD (gastroesophageal reflux disease)   . Hyperlipidemia   . Hypertension   . Internal bleeding hemorrhoids 2012   "might bleed once/month; only when I strain"  . Internal prolapsed hemorrhoids   . Leg cramps   . Testicular hypofunction      Social History   Social History  . Marital status: Married    Spouse name: N/A  . Number of children: N/A  . Years of education: N/A   Occupational History  . Not on file.   Social History Main Topics  . Smoking status: Never Smoker  . Smokeless tobacco: Never Used     Comment: NEVER USED TOBACCO  . Alcohol use No  . Drug use: No  . Sexual activity: Not Currently   Other Topics Concern  . Not on file   Social History Narrative   Marital Status: Married Pamala Hurry)    Children:  Step Daughter Sharyn Lull)    Pets: None    Living Situation: Lives with Pamala Hurry    Occupation: [employer] ([place])    Education: Associate's Degree in [employer]    Tobacco Use/Exposure:  None    Alcohol Use:  Rarely    Drug Use:  None   Diet:  Regular   Exercise: Limited     Hobbies:  Hunting, Fishing              Past Surgical  History:  Procedure Laterality Date  . ANAL FISSURE REPAIR  06/24/2013  . APPENDECTOMY  2011  . COLONOSCOPY W/ BIOPSIES AND POLYPECTOMY  2012  . COLONSCOPY  2015  . EXCISIONAL HEMORRHOIDECTOMY    . FISSURECTOMY    . HEMORRHOID SURGERY  06/24/2013  . internal sphincterotomy  06-24-2013  . LUMBAR LAMINECTOMY/DECOMPRESSION MICRODISCECTOMY N/A 01/20/2015   Procedure: MICRO LUMBAR DECOMPRESSION L4-5/L3-4/L2-3;  Surgeon: Johnn Hai, MD;  [facility];  Service: Orthopedics;  Laterality: N/A;  . SHOULDER ARTHROSCOPY  08/22/2012   Procedure: ARTHROSCOPY SHOULDER;  Surgeon: Marin Shutter, MD;  Location: [facility];  Service: Orthopedics;  Laterality: Left;  Left shoulder arthroscopic lavage and debridement  . SHOULDER ARTHROSCOPY  09/02/2012   Procedure: ARTHROSCOPY SHOULDER;  Surgeon: Marin Shutter, MD;  Location: [facility];  Service: Orthopedics;  Laterality: Left;  Left shoulder arthroscopy irrigation and debridement  . SHOULDER ARTHROSCOPY W/ ROTATOR CUFF REPAIR  07/09/2012   left  . SKIN LESION EXCISION     Benign  . WISDOM TOOTH EXTRACTION  YRS AGO    Family History  Problem Relation Age of Onset  . Heart disease  Mother 37    Deceased  . Hyperlipidemia Mother   . Hypertension Mother   . Heart disease Father 17    Deceased  . CVA Father   . Hypertension Maternal Grandmother   . Hyperlipidemia Maternal Grandmother   . Cancer Maternal Grandmother     Colon Cancer  . Heart disease Maternal Grandfather   . Hypertension Maternal Grandfather   . Hyperlipidemia Maternal Grandfather   . Cancer Maternal Grandfather     Lung Cancer  . Heart attack Maternal Grandfather   . Heart disease Paternal Grandfather     Allergies  Allergen Reactions  . Tetanus Toxoids Anaphylaxis  . Daptomycin Other (See Comments)    myalgias  . Asa [Aspirin] Nausea Only and Rash  . Citrus Diarrhea    All citrus fruits  . Milk-Related Compounds Diarrhea    Milk and milk products  . Oxycodone   .  Penicillins Rash  . Prednisone (Pak) [Prednisone] Other (See Comments)    Hyperactivity WITH ORAL, CAN TAKE SHOTS-Cannot do Dose pack    Current Outpatient Prescriptions on File Prior to Visit  Medication Sig Dispense Refill  . ALLERGIST [name] See admin instructions.  6  . Blood Glucose Monitoring Suppl (RELION CONFIRM GLUCOSE MONITOR) W/DEVICE KIT USE AS DIRECTED TO TEST BLOOD GLUCOSE DAILY DX: 250.00 1 kit 0  . Cholecalciferol (VITAMIN D-3) 5000 UNITS TABS Take 1 tablet by mouth daily.    Marland Kitchen [name] Take 25 mg by mouth daily.    Marland Kitchen docusate sodium (COLACE) 100 MG capsule Take 100 mg by mouth daily.     Marland Kitchen doxycycline (VIBRA-TABS) 100 MG tablet Take 1 tablet (100 mg total) by mouth 2 (two) times daily. 20 tablet 0  . EPIPEN 2-PAK 0.3 MG/0.3ML SOAJ injection Inject 0.3 mg into the skin as needed (allergies).     . fenofibrate (TRICOR) 145 MG tablet Take 1 tablet (145 mg total) by mouth daily. 30 tablet 11  . hydrochlorothiazide (HYDRODIURIL) 25 MG tablet TAKE 1 TABLET (25 MG TOTAL) BY MOUTH DAILY. 90 tablet 1  . HYDROcodone-acetaminophen (NORCO/VICODIN) 5-325 MG tablet Take 1 tablet by mouth as needed for pain.    . [name] TAKE 2 TABLETS (200 MG TOTAL) BY MOUTH DAILY BEFORE BREAKFAST. 60 tablet 1  . LIVALO 4 MG TABS TAKE 1 TABLET (4 MG TOTAL) BY MOUTH DAILY. 30 tablet 5  . methocarbamol (ROBAXIN) 500 MG tablet Take 1 tablet (500 mg total) by mouth every 8 (eight) hours as needed for muscle spasms. 90 tablet 1  . omeprazole (PRILOSEC) 20 MG capsule TAKE 1 CAPSULE (20 MG TOTAL) BY MOUTH DAILY. 90 capsule 1  . [name] Take 1 tablet by mouth daily. Reported on 01/11/2016    . rivaroxaban (XARELTO) 10 MG TABS tablet Take 10 mg by mouth daily.    Marland Kitchen VASCEPA 1 g CAPS TAKE 2 CAPSULES BY MOUTH TWICE A DAY AS DIRECTED 120 capsule 3  . zolpidem (AMBIEN) 5 MG tablet Take 1 tablet (5 mg total) by mouth at bedtime as needed. for  sleep 30 tablet 0   No current facility-administered medications on file prior to visit.     BP 134/74 (BP Location: Right Arm, Patient Position: Sitting, Cuff Size: Large)   Pulse 75   Temp 98.1 F (36.7 C) (Oral)   Resp 18   Ht _0  (1.727 m)   Wt 237 lb 6.4 oz (  107.7 kg)   SpO2 97% Comment: RA  BMI 36.10 kg/m       Objective:   Physical Exam   General Appearance- Not in acute distress.   Chest and Lung Exam Auscultation: Breath sounds:-Normal. CTA. Adventitious sounds:- No Adventitious sounds.  Cardiovascular Auscultation:Rythm - Regular. Heart Sounds -Normal heart sounds.  Abdomen Inspection:-Inspection Normal.  Palpation/Perucssion: Palpation and Percussion of the abdomen reveal non tender presently, on palpation no induration or fluctuance. Using q-tip and inspecting area no yellow dc,(only faint dry blood presently on q-tip) , No rigidity(Guarding) and No Palpable abdominal masses.(no abscess) Liver:-Normal.  Spleen:- Normal.   Back-no cva pain.      Assessment & Plan:  For your skin infection in the umbilical area, I do think the area looks a lot better. I want you to continue the doxycycline antibiotic and the warm salt water soaks.   Follow up as needed if after 5 days the area worsens or reoccurs.  Marley Pakula, Percell Miller, PA-C

## 2016-11-20 NOTE — Patient Instructions (Signed)
For your skin infection in the umbilical area, I do think the area looks a lot better. I want you to continue the doxycycline antibiotic and the warm salt water soaks.   Follow up as needed if after 5 days the area worsens or reoccurs.

## 2016-12-02 ENCOUNTER — Other Ambulatory Visit: Payer: Self-pay | Admitting: Physician Assistant

## 2016-12-11 ENCOUNTER — Other Ambulatory Visit: Payer: BLUE CROSS/BLUE SHIELD | Admitting: *Deleted

## 2016-12-11 DIAGNOSIS — E785 Hyperlipidemia, unspecified: Secondary | ICD-10-CM

## 2016-12-11 LAB — HEPATIC FUNCTION PANEL
ALBUMIN: 3.9 g/dL (ref 3.6–5.1)
ALK PHOS: 39 U/L — AB (ref 40–115)
ALT: 26 U/L (ref 9–46)
AST: 30 U/L (ref 10–35)
Bilirubin, Direct: 0.2 mg/dL (ref <=0.2)
Indirect Bilirubin: 0.5 mg/dL (ref 0.2–1.2)
TOTAL PROTEIN: 6.4 g/dL (ref 6.1–8.1)
Total Bilirubin: 0.7 mg/dL (ref 0.2–1.2)

## 2016-12-11 LAB — LIPID PANEL
Cholesterol: 121 mg/dL (ref <200)
HDL: 26 mg/dL — AB (ref >40)
LDL CALC: 53 mg/dL (ref <100)
TRIGLYCERIDES: 212 mg/dL — AB (ref <150)
Total CHOL/HDL Ratio: 4.7 Ratio (ref <5.0)
VLDL: 42 mg/dL — AB (ref <30)

## 2016-12-23 ENCOUNTER — Other Ambulatory Visit: Payer: Self-pay | Admitting: Physician Assistant

## 2017-01-01 ENCOUNTER — Ambulatory Visit (INDEPENDENT_AMBULATORY_CARE_PROVIDER_SITE_OTHER): Payer: BLUE CROSS/BLUE SHIELD | Admitting: Pharmacist

## 2017-01-01 DIAGNOSIS — E781 Pure hyperglyceridemia: Secondary | ICD-10-CM | POA: Diagnosis not present

## 2017-01-01 NOTE — Patient Instructions (Addendum)
Your cholesterol looked excellent - continue with current medications.  Recheck lipid panel in 3 months - Monday, April 9th any time after 7:30am for fasting lipids.  Follow up in lipid clinic on Monday, April 23rd at 2pm.  Call [phone] in April to schedule your annual visit in July.

## 2017-01-01 NOTE — Progress Notes (Signed)
Patient ID: Ian [name]                 DOB: Jul 09, 1961                    MRN: [id number]    New PCP: Riki Sheer DO with [facility]  HPI: Ian [name] is a pleasant 56 y.o. male patient referred to lipid clinic by Dr. Acie Fredrickson for elevated triglycerides who presents today for follow up. PMH is significant for HLD, elevated TG, DM, HTN, and history of PE following back surgery. At last visit, pt was switched from fenofibrate capsules to tablets to see if this would help with pt's constipation which was affecting adherence. Pt reports tolerating tablets better.  TG have been as high as 1700 previously, dropped to ~600 6 weeks later. Pt reported he ate less beef during this time and that is the only thing he did differently. He is intolerant to atorvastatin and rosuvastatin, currently tolerating pitavastatin. Tolerating fenofibrate tablets better than capsules with no further complaints of constipation. Does not drink alcohol and avoids sugary foods. Watches his carbs since he is diabetic.  TG much improved from baseline of 1700 earlier this year to 200 currently. Pt has lost 33 lbs and has been focusing on dietary improvements. He is eating a lot of lean chicken, no alcohol. Walking 3 miles a day.   Current Medications: pitavastatin 87m daily, fenofibrate 1437mtablet daily, Vascepa 4g daily Intolerances: atorvastatin 10 and 2088maily, rosuvastatin - muscle aches with both, gemfibrozil 600m82mD, Zetia 10mg62mly, niacin - flushing, Welchol 625mg 77mbs BID, fenofibrate 134mg c69mles - constipation TG goal:  <150, LDL goal < 100 for primary prevention with risk factors (DM and HTN)  Diet: Eats greens, beef - 3x a week and has since cut out. Doesn't drink alcohol. Diabetic - knows carbohydrate sources well. Doesn't eat sweets. Focusing more on lean chicken. Eats 2 eggs most days a week.  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   Family History:  Maternal grandfather with heart disease and MI, mother was a smoker with CAD, dad died from gallbladder complications.  Social History: Denies alcohol use, cigarette use, or illicit drug use.  Labs: 12/11/16: TC 121, TG 212, HDL 26, LDL 53 (Livalo 4mg, Va23mpa 4mg, fen57mbrate 145mg) 08/28m7  TC 163, TG 545, HDL 27.3, LDL- D 35, A1C 6.6% (Livalo 4mg, Vasce2m4g, fenofibrate 145mg) 5/30/55mTC 168, TG 617, HDL 25.9, LDL-D 26 (Livalo 4mg, Vascepa60m, fenofibrate 134mg but repo52meating less meat) 04/11/16: TC 289, TG 1702, HDL 21.6, LDL-D 36 (Livalo 4mg, Vascepa 426mfenofibrate 134mg). A1c 7.9%1mst Medical History:  Diagnosis Date  . Allergy   . Arthritis   . Biceps tendon rupture    bilateral  . Clostridium difficile infection 2014  . Colon polyps   . [place] ([facility])    [place]  . GERD (gastroesophageal reflux disease)   . Hyperlipidemia   . Hypertension   . Internal bleeding hemorrhoids 2012   "might bleed once/month; only when I strain"  . Internal prolapsed hemorrhoids   . Leg cramps   . Testicular hypofunction     Current Outpatient Prescriptions on File Prior to Visit  Medication Sig Dispense Refill  . ALLERGIST [name] See admin instructions.  6  . Blood Glucose Monitoring Suppl (RELION CONFIRM GLUCOSE MONITOR) W/DEVICE KIT USE AS DIRECTED TO TEST BLOOD  GLUCOSE DAILY DX: 250.00 1 kit 0  . Cholecalciferol (VITAMIN D-3) 5000 UNITS TABS Take 1 tablet by mouth daily.    Marland Kitchen [name] Take 25 mg by mouth daily.    Marland Kitchen docusate sodium (COLACE) 100 MG capsule Take 100 mg by mouth daily.     Marland Kitchen doxycycline (VIBRA-TABS) 100 MG tablet Take 1 tablet (100 mg total) by mouth 2 (two) times daily. 20 tablet 0  . EPIPEN 2-PAK 0.3 MG/0.3ML SOAJ injection Inject 0.3 mg into the skin as needed (allergies).     . fenofibrate (TRICOR) 145 MG tablet Take 1 tablet (145 mg total) by mouth daily. 30 tablet 11  . hydrochlorothiazide  (HYDRODIURIL) 25 MG tablet TAKE 1 TABLET (25 MG TOTAL) BY MOUTH DAILY. 90 tablet 1  . HYDROcodone-acetaminophen (NORCO/VICODIN) 5-325 MG tablet Take 1 tablet by mouth as needed for pain.    . [name] TAKE 2 TABLETS (200 MG TOTAL) BY MOUTH DAILY BEFORE BREAKFAST. 62 tablet 1  . LIVALO 4 MG TABS TAKE 1 TABLET (4 MG TOTAL) BY MOUTH DAILY. 30 tablet 3  . methocarbamol (ROBAXIN) 500 MG tablet Take 1 tablet (500 mg total) by mouth every 8 (eight) hours as needed for muscle spasms. 90 tablet 1  . omeprazole (PRILOSEC) 20 MG capsule TAKE 1 CAPSULE (20 MG TOTAL) BY MOUTH DAILY. 90 capsule 1  . [name] Take 1 tablet by mouth daily. Reported on 01/11/2016    . rivaroxaban (XARELTO) 10 MG TABS tablet Take 10 mg by mouth daily.    Marland Kitchen VASCEPA 1 g CAPS TAKE 2 CAPSULES BY MOUTH TWICE A DAY AS DIRECTED 120 capsule 3  . zolpidem (AMBIEN) 5 MG tablet Take 1 tablet (5 mg total) by mouth at bedtime as needed. for sleep 30 tablet 0   No current facility-administered medications on file prior to visit.     Allergies  Allergen Reactions  . Tetanus Toxoids Anaphylaxis  . Daptomycin Other (See Comments)    myalgias  . Asa [Aspirin] Nausea Only and Rash  . Citrus Diarrhea    All citrus fruits  . Milk-Related Compounds Diarrhea    Milk and milk products  . Oxycodone   . Penicillins Rash  . Prednisone (Pak) [Prednisone] Other (See Comments)    Hyperactivity WITH ORAL, CAN TAKE SHOTS-Cannot do Dose pack    Assessment/Plan:  1. Hypertriglyceridemia - TG much improved from baseline of 1700 down to 200, LDL at goal < 100. Pt has made many improvements with lifestyle modifications including weight loss of > 30 pounds and cutting out excess carbs/sugar. Will continue Livalo, fenofibrate, and fish oil. F/u with lipid panel and office visit in 3 months per patient request.   Megan E. Supple, PharmD, CPP, [place] [address], [place],   [address] Phone: [phone]; Fax: [phone] 01/01/2017 3:05 PM

## 2017-01-16 ENCOUNTER — Other Ambulatory Visit: Payer: Self-pay | Admitting: Physician Assistant

## 2017-01-18 ENCOUNTER — Encounter: Payer: Self-pay | Admitting: Family Medicine

## 2017-01-18 ENCOUNTER — Ambulatory Visit (INDEPENDENT_AMBULATORY_CARE_PROVIDER_SITE_OTHER): Payer: BLUE CROSS/BLUE SHIELD | Admitting: Family Medicine

## 2017-01-18 VITALS — BP 108/72 | HR 70 | Temp 98.6°F | Ht 68.0 in | Wt 230.2 lb

## 2017-01-18 DIAGNOSIS — A084 Viral intestinal infection, unspecified: Secondary | ICD-10-CM | POA: Diagnosis not present

## 2017-01-18 NOTE — Telephone Encounter (Signed)
Patient has appt. Today 01/18/2017 and can discuss request.

## 2017-01-18 NOTE — Progress Notes (Signed)
Pre visit review using our clinic review tool, if applicable. No additional management support is needed unless otherwise documented below in the visit note. 

## 2017-01-18 NOTE — Patient Instructions (Signed)
Prevention: Best thing you can do to prevent things like this in the future is washing your hands with soap and water and try to practice good hand hygiene.

## 2017-01-18 NOTE — Telephone Encounter (Signed)
What is this for? It hasn't been rx'd in a while. TY.

## 2017-01-18 NOTE — Progress Notes (Signed)
Chief Complaint  Patient presents with  . Diarrhea  . Chills    Ian [name] is here for abdominal pain.  Duration: 2 days Was having pain, N/V/D, cramping pain, poor appetite, rigors. He is not having any of that today. Denies fevers, bleeding. Starting to feel better. Treatment to date: None  ROS: Constitutional: No current fevers GI: as noted in HPI  Past Medical History:  Diagnosis Date  . Allergy   . Arthritis   . Biceps tendon rupture    bilateral  . Clostridium difficile infection 2014  . Colon polyps   . Diabetes ([place])    Borderline/Pre-diabetes  . GERD (gastroesophageal reflux disease)   . Hyperlipidemia   . Hypertension   . Internal bleeding hemorrhoids 2012   "might bleed once/month; only when I strain"  . Internal prolapsed hemorrhoids   . Leg cramps   . Testicular hypofunction    Family History  Problem Relation Age of Onset  . Heart disease Mother 39    Deceased  . Hyperlipidemia Mother   . Hypertension Mother   . Heart disease Father 48    Deceased  . CVA Father   . Hypertension Maternal Grandmother   . Hyperlipidemia Maternal Grandmother   . Cancer Maternal Grandmother     Colon Cancer  . Heart disease Maternal Grandfather   . Hypertension Maternal Grandfather   . Hyperlipidemia Maternal Grandfather   . Cancer Maternal Grandfather     Lung Cancer  . Heart attack Maternal Grandfather   . Heart disease Paternal Grandfather    Past Surgical History:  Procedure Laterality Date  . ANAL FISSURE REPAIR  06/24/2013  . APPENDECTOMY  2011  . COLONOSCOPY W/ BIOPSIES AND POLYPECTOMY  2012  . COLONSCOPY  2015  . EXCISIONAL HEMORRHOIDECTOMY    . FISSURECTOMY    . HEMORRHOID SURGERY  06/24/2013  . internal sphincterotomy  06-24-2013  . LUMBAR LAMINECTOMY/DECOMPRESSION MICRODISCECTOMY N/A 01/20/2015   Procedure: MICRO LUMBAR DECOMPRESSION L4-5/L3-4/L2-3;  Surgeon: Johnn Hai, MD;  Location: [facility];  Service: Orthopedics;  Laterality: N/A;  .  SHOULDER ARTHROSCOPY  08/22/2012   Procedure: ARTHROSCOPY SHOULDER;  Surgeon: Marin Shutter, MD;  Location: [place];  Service: Orthopedics;  Laterality: Left;  Left shoulder arthroscopic lavage and debridement  . SHOULDER ARTHROSCOPY  09/02/2012   Procedure: ARTHROSCOPY SHOULDER;  Surgeon: Marin Shutter, MD;  Location: [place];  Service: Orthopedics;  Laterality: Left;  Left shoulder arthroscopy irrigation and debridement  . SHOULDER ARTHROSCOPY W/ ROTATOR CUFF REPAIR  07/09/2012   left  . SKIN LESION EXCISION     Benign  . WISDOM TOOTH EXTRACTION  YRS AGO    BP 108/72 (BP Location: Left Arm, Patient Position: Sitting, Cuff Size: Large)   Pulse 70   Temp 98.6 F (37 C) (Oral)   Ht 5\' 8"  (1.727 m)   Wt 230 lb 4 oz (104.4 kg)   SpO2 97%   BMI 35.01 kg/m  Gen.: Awake, alert, appears stated age 56: Mucous membranes moist without mucosal lesions Heart: Regular rate and rhythm without murmurs Lungs: Clear auscultation bilaterally, no rales or wheezing, normal effort without accessory muscle use. Abdomen: Bowel sounds are present. Abdomen is soft, nontender, nondistended, no masses or organomegaly. Negative Murphy's, Rovsing's, McBurney's, and Carnett's sign. Psych: Age appropriate judgment and insight. Normal mood and affect.  Viral gastroenteritis  Appears to be resolving, counseled on how to best prevent this in future (good hand hygiene and avoid eating at institutions of questionable  sanitation). Let us know if things change. I will see him in a few weeks for a previously scheduled DM visit. Pt voiced understanding and agreement to the plan.  [place], DO 01/18/17 9:59 AM

## 2017-01-29 ENCOUNTER — Ambulatory Visit (INDEPENDENT_AMBULATORY_CARE_PROVIDER_SITE_OTHER): Payer: BLUE CROSS/BLUE SHIELD | Admitting: Family Medicine

## 2017-01-29 ENCOUNTER — Encounter: Payer: Self-pay | Admitting: Family Medicine

## 2017-01-29 VITALS — BP 110/70 | HR 78 | Temp 98.2°F | Ht 68.0 in | Wt 227.6 lb

## 2017-01-29 DIAGNOSIS — E785 Hyperlipidemia, unspecified: Secondary | ICD-10-CM

## 2017-01-29 DIAGNOSIS — E119 Type 2 diabetes mellitus without complications: Secondary | ICD-10-CM

## 2017-01-29 DIAGNOSIS — I10 Essential (primary) hypertension: Secondary | ICD-10-CM

## 2017-01-29 LAB — HEMOGLOBIN A1C: Hgb A1c MFr Bld: 6.1 % (ref 4.6–6.5)

## 2017-01-29 NOTE — Patient Instructions (Signed)
Healthy Eating Plan Many factors influence your heart health, including eating and exercise habits. Heart (coronary) risk increases with abnormal blood fat (lipid) levels. Heart-healthy meal planning includes limiting unhealthy fats, increasing healthy fats, and making other small dietary changes. This includes maintaining a healthy body weight to help keep lipid levels within a normal range.  WHAT IS MY PLAN?  Your health care provider recommends that you:  Drink a glass of water before meals to help with satiety.  Eat slowly.  An alternative to the water is to add Metamucil. This will help with satiety as well. It does contain calories, unlike water.  WHAT TYPES OF FAT SHOULD I CHOOSE?  Choose healthy fats more often. Choose monounsaturated and polyunsaturated fats, such as olive oil and canola oil, flaxseeds, walnuts, almonds, and seeds.  Eat more omega-3 fats. Good choices include salmon, mackerel, sardines, tuna, flaxseed oil, and ground flaxseeds. Aim to eat fish at least two times each week.  Avoid foods with partially hydrogenated oils in them. These contain trans fats. Examples of foods that contain trans fats are stick margarine, some tub margarines, cookies, crackers, and other baked goods. If you are going to avoid a fat, this is the one to avoid!  WHAT GENERAL GUIDELINES DO I NEED TO FOLLOW?  Check food labels carefully to identify foods with trans fats. Avoid these types of options when possible.  Fill one half of your plate with vegetables and green salads. Eat 4-5 servings of vegetables per day. A serving of vegetables equals 1 cup of raw leafy vegetables,  cup of raw or cooked cut-up vegetables, or  cup of vegetable juice.  Fill one fourth of your plate with whole grains. Look for the word "whole" as the first word in the ingredient list.  Fill one fourth of your plate with lean protein foods.  Eat 4-5 servings of fruit per day. A serving of fruit equals one medium  whole fruit,  cup of dried fruit,  cup of fresh, frozen, or canned fruit. Try to avoid fruits in cups/syrups as the sugar content can be high.  Eat more foods that contain soluble fiber. Examples of foods that contain this type of fiber are apples, broccoli, carrots, beans, peas, and barley. Aim to get 20-30 g of fiber per day.  Eat more home-cooked food and less restaurant, buffet, and fast food.  Limit or avoid alcohol.  Limit foods that are high in starch and sugar.  Avoid fried foods when able.  Cook foods by using methods other than frying. Baking, boiling, grilling, and broiling are all great options. Other fat-reducing suggestions include: ? Removing the skin from poultry. ? Removing all visible fats from meats. ? Skimming the fat off of stews, soups, and gravies before serving them. ? Steaming vegetables in water or broth.  Lose weight if you are overweight. Losing just 5-10% of your initial body weight can help your overall health and prevent diseases such as diabetes and heart disease.  Increase your consumption of nuts, legumes, and seeds to 4-5 servings per week. One serving of dried beans or legumes equals  cup after being cooked, one serving of nuts equals 1 ounces, and one serving of seeds equals  ounce or 1 tablespoon.  WHAT ARE GOOD FOODS CAN I EAT? Grains Grainy breads (try to find bread that is 3 g of fiber per slice or greater), oatmeal, light popcorn. Whole-grain cereals. Rice and pasta, including brown rice and those that are made with whole wheat.   Edamame pasta is a great alternative to grain pasta. It has a higher protein content. Try to avoid significant consumption of white bread, sugary cereals, or pastries/baked goods.  Vegetables All vegetables. Cooked white potatoes do not count as vegetables.  Fruits All fruits, but limit pineapple and bananas as these fruits have a higher sugar content.  Meats and Other Protein Sources Lean, well-trimmed beef,  veal, pork, and lamb. Chicken and turkey without skin. All fish and shellfish. Wild duck, rabbit, pheasant, and venison. Egg whites or low-cholesterol egg substitutes. Dried beans, peas, lentils, and tofu.Seeds and most nuts.  Dairy Low-fat or nonfat cheeses, including ricotta, string, and mozzarella. Skim or 1% milk that is liquid, powdered, or evaporated. Buttermilk that is made with low-fat milk. Nonfat or low-fat yogurt. Soy/Almond milk are good alternatives if you cannot handle dairy.  Beverages Water is the best for you. Sports drinks with less sugar are more desirable unless you are a highly active athlete.  Sweets and Desserts Sherbets and fruit ices. Honey, jam, marmalade, jelly, and syrups. Dark chocolate.  Eat all sweets and desserts in moderation.  Fats and Oils Nonhydrogenated (trans-free) margarines. Vegetable oils, including soybean, sesame, sunflower, olive, peanut, safflower, corn, canola, and cottonseed. Salad dressings or mayonnaise that are made with a vegetable oil. Limit added fats and oils that you use for cooking, baking, salads, and as spreads.  Other Cocoa powder. Coffee and tea. Most condiments.  The items listed above may not be a complete list of recommended foods or beverages. Contact your dietitian for more options.   

## 2017-01-29 NOTE — Progress Notes (Signed)
Subjective:   Chief Complaint  Patient presents with  . Follow-up    3 months-DM    Ian [name] is a 56 y.o. male here for follow-up of diabetes.   Ian [name]'s self monitored glucose range is high 90's, low 100's. Patient denies hypoglycemic reactions outside of an episode when he was sick. He checks his glucose levels 1-2times per day. Patient does not require insulin.   Medications include: Invokana 200 mg daily; failed metformin due to side effects Patient exercises 5 days per week on average.   He does take an aspirin daily. Statin? Yes ACEi/ARB? Yes  Hypertension Patient presents for hypertension follow up. He does monitor home blood pressures. Blood pressures ranging on average from 110's/70's. He is compliant with medications. Patient has these side effects of medication: none He is adhering to a healthy diet overall. Exercise: walking  Dyslipidemia Patient presents for dyslipidemia follow up. Compliance with treatment thus far has been good. He denies myalgias. He is having some constipation issues on Fenofibrate. He was placed on this by the lipid clinic. He is adhering to a low sodium and low fat diet. Exercise: walking. The patient is not known to have coexisting coronary artery disease.   Past Medical History:  Diagnosis Date  . Allergy   . Arthritis   . Biceps tendon rupture    bilateral  . Clostridium difficile infection 2014  . Colon polyps   . Diabetes ([place])    Borderline/Pre-diabetes  . GERD (gastroesophageal reflux disease)   . Hyperlipidemia   . Hypertension   . Internal bleeding hemorrhoids 2012   "might bleed once/month; only when I strain"  . Internal prolapsed hemorrhoids   . Leg cramps   . Testicular hypofunction     Past Surgical History:  Procedure Laterality Date  . ANAL FISSURE REPAIR  06/24/2013  . APPENDECTOMY  2011  . COLONOSCOPY W/ BIOPSIES AND POLYPECTOMY  2012  . COLONSCOPY  2015  . EXCISIONAL HEMORRHOIDECTOMY    .  FISSURECTOMY    . HEMORRHOID SURGERY  06/24/2013  . internal sphincterotomy  06-24-2013  . LUMBAR LAMINECTOMY/DECOMPRESSION MICRODISCECTOMY N/A 01/20/2015   Procedure: MICRO LUMBAR DECOMPRESSION L4-5/L3-4/L2-3;  Surgeon: Johnn Hai, MD;  [facility];  Service: Orthopedics;  Laterality: N/A;  . SHOULDER ARTHROSCOPY  08/22/2012   Procedure: ARTHROSCOPY SHOULDER;  Surgeon: Marin Shutter, MD;  Location: [facility];  Service: Orthopedics;  Laterality: Left;  Left shoulder arthroscopic lavage and debridement  . SHOULDER ARTHROSCOPY  09/02/2012   Procedure: ARTHROSCOPY SHOULDER;  Surgeon: Marin Shutter, MD;  Location: [facility];  Service: Orthopedics;  Laterality: Left;  Left shoulder arthroscopy irrigation and debridement  . SHOULDER ARTHROSCOPY W/ ROTATOR CUFF REPAIR  07/09/2012   left  . SKIN LESION EXCISION     Benign  . WISDOM TOOTH EXTRACTION  YRS AGO    Social History   Social History  . Marital status: Married   Social History Main Topics  . Smoking status: Never Smoker  . Smokeless tobacco: Never Used     Comment: NEVER USED TOBACCO  . Alcohol use No  . Drug use: No  . Sexual activity: Not Currently   Social History Narrative   Marital Status: Married Pamala Hurry)    Children:  Step Daughter Sharyn Lull)    Pets: None    Living Situation: Lives with Pamala Hurry    Occupation: [place] ([place]    Education: Associate's Degree in [employer]    Tobacco Use/Exposure:  None  Alcohol Use:  Rarely    Drug Use:  None   Diet:  Regular   Exercise: Limited     Hobbies:  Hunting, Fishing              Current Outpatient Prescriptions on File Prior to Visit  Medication Sig Dispense Refill  . ALLERGIST [name] See admin instructions.  6  . Blood Glucose Monitoring Suppl (RELION CONFIRM GLUCOSE MONITOR) W/DEVICE KIT USE AS DIRECTED TO TEST BLOOD GLUCOSE DAILY DX: 250.00 1 kit 0  . Cholecalciferol (VITAMIN D-3) 5000 UNITS TABS Take 1 tablet by  mouth daily.    Marland Kitchen [name] Take 25 mg by mouth daily.    Marland Kitchen docusate sodium (COLACE) 100 MG capsule Take 200 mg by mouth daily.     Marland Kitchen EPIPEN 2-PAK 0.3 MG/0.3ML SOAJ injection Inject 0.3 mg into the skin as needed (allergies).     . fenofibrate (TRICOR) 145 MG tablet Take 1 tablet (145 mg total) by mouth daily. 30 tablet 11  . gabapentin (NEURONTIN) 300 MG capsule Take 300 mg by mouth at bedtime.    . hydrochlorothiazide (HYDRODIURIL) 25 MG tablet TAKE 1 TABLET (25 MG TOTAL) BY MOUTH DAILY. 90 tablet 1  . LIVALO 4 MG TABS TAKE 1 TABLET (4 MG TOTAL) BY MOUTH DAILY. 30 tablet 3  . magnesium oxide (MAG-OX) 400 MG tablet Take 800 mg by mouth daily.    . methocarbamol (ROBAXIN) 500 MG tablet Take 1 tablet (500 mg total) by mouth every 8 (eight) hours as needed for muscle spasms. 90 tablet 1  . omeprazole (PRILOSEC) 20 MG capsule TAKE 1 CAPSULE (20 MG TOTAL) BY MOUTH DAILY. 90 capsule 1  . [name] Take 1 tablet by mouth daily. Reported on 01/11/2016    . pyridoxine (B-6) 200 MG tablet Take 200 mg by mouth daily.    . rivaroxaban (XARELTO) 10 MG TABS tablet Take 10 mg by mouth daily.    Marland Kitchen VASCEPA 1 g CAPS TAKE 2 CAPSULES BY MOUTH TWICE A DAY AS DIRECTED 120 capsule 3    Related testing: Foot exam(monofilament and inspection):done Retinal exam:done Pneumovax: done Flu Shot: done  Review of Systems: Eye:  No recent significant change in vision Pulmonary:  No SOB Cardiovascular:  No chest pain, no palpitations Skin/Integumentary ROS:  No abnormal skin lesions reported Neurologic:  No numbness, tingling  Objective:  BP 110/70 (BP Location: Left Arm, Patient Position: Sitting, Cuff Size: Large)   Pulse 78   Temp 98.2 F (36.8 C) (Oral)   Ht _0  (1.727 m)   Wt 227 lb 9.6 oz (103.2 kg)   SpO2 97%   BMI 34.61 kg/m  General:  Well developed, well nourished, in no apparent distress Skin:  Warm, no pallor or diaphoresis Head:  Normocephalic, atraumatic Eyes:  Pupils  equal and round, sclera anicteric without injection  Nose:  External nares without trauma, no discharge Throat/Pharynx:  Lips and gingiva without lesion Neck: Neck supple.  No obvious thyromegaly or masses.  No bruits Lungs:  clear to auscultation, breath sounds equal bilaterally, no wheezes, rales, or stridor Cardio:  regular rate and rhythm without murmurs, no bruits, no LE edema Musculoskeletal:  Symmetrical muscle groups noted without atrophy or deformity Neuro:  Sensation intact to pinprick on feet Psych: Age appropriate judgment and insight  Assessment:   Type 2 diabetes mellitus without complication, without long-term current use of insulin ([place]) - Plan: canagliflozin (  INVOKANA) 100 MG TABS tablet, Hemoglobin A1c, HM Diabetes Foot Exam  Essential hypertension, benign  Hyperlipidemia, unspecified hyperlipidemia type   Plan:   Orders as above. Controlled right now, decrease in level, from 200 mg to 100 mg daily. If his A1c comes back higher than expected, will inform the patient. Counseled on diet and exercise. F/u in 6 mo. The patient voiced understanding and agreement to the plan.  [place], DO 01/29/17 1:17 PM

## 2017-01-31 ENCOUNTER — Other Ambulatory Visit: Payer: Self-pay | Admitting: Physician Assistant

## 2017-01-31 NOTE — Telephone Encounter (Signed)
OK to refill. Change rx to 1 tab (100 mg) daily. TY.

## 2017-02-05 ENCOUNTER — Encounter: Payer: Self-pay | Admitting: Family Medicine

## 2017-02-05 ENCOUNTER — Ambulatory Visit (HOSPITAL_BASED_OUTPATIENT_CLINIC_OR_DEPARTMENT_OTHER)
Admission: RE | Admit: 2017-02-05 | Discharge: 2017-02-05 | Disposition: A | Discharge status: Home / Self Care | Payer: BLUE CROSS/BLUE SHIELD | Source: Ambulatory Visit | Attending: Family Medicine | Admitting: Family Medicine

## 2017-02-05 ENCOUNTER — Ambulatory Visit (INDEPENDENT_AMBULATORY_CARE_PROVIDER_SITE_OTHER): Payer: BLUE CROSS/BLUE SHIELD | Admitting: Family Medicine

## 2017-02-05 VITALS — BP 120/60 | HR 75 | Temp 98.1°F | Ht 68.0 in | Wt 230.0 lb

## 2017-02-05 DIAGNOSIS — X58XXXA Exposure to other specified factors, initial encounter: Secondary | ICD-10-CM | POA: Insufficient documentation

## 2017-02-05 DIAGNOSIS — S92511A Displaced fracture of proximal phalanx of right lesser toe(s), initial encounter for closed fracture: Secondary | ICD-10-CM | POA: Insufficient documentation

## 2017-02-05 DIAGNOSIS — S99921A Unspecified injury of right foot, initial encounter: Secondary | ICD-10-CM | POA: Diagnosis present

## 2017-02-05 NOTE — Patient Instructions (Addendum)
Ok to use ice in the first 48-72 hours.   Tylenol and ibuprofen for pain.  If you are going in public, wear the flat shoe.  If anything changes with the interpretation of your X-ray, we will call you with any accompanying changes.  Let us know if you have any questions.  Go in to get an X-ray prior to your next appointment.

## 2017-02-05 NOTE — Progress Notes (Signed)
Musculoskeletal Exam  Patient: Ian [name] DOB: 1961-10-04  DOS: 02/05/2017  SUBJECTIVE:  Chief Complaint:   Chief Complaint  Patient presents with  . Stomped toe    (R) 4th toe-last night    Ian [name] is a 56 y.o.  male for evaluation and treatment of R 4th toe pain.   Onset:  1 day ago. Stubbed toe on dresser.  Location: Prox R 4th toe, hurts only when he walks Character:  sharp  Progression of issue:  is unchanged Associated symptoms: Bruising, some swelling Treatment: to date has been none.   Neurovascular symptoms: no  ROS: Musculoskeletal/Extremities: +R toe pain Neurologic: no numbness, tingling no weakness   Past Medical History:  Diagnosis Date  . Allergy   . Arthritis   . Biceps tendon rupture    bilateral  . Clostridium difficile infection 2014  . Colon polyps   . Diabetes ([place])    Borderline/Pre-diabetes  . GERD (gastroesophageal reflux disease)   . Hyperlipidemia   . Hypertension   . Internal bleeding hemorrhoids 2012   "might bleed once/month; only when I strain"  . Internal prolapsed hemorrhoids   . Leg cramps   . Testicular hypofunction    Past Surgical History:  Procedure Laterality Date  . ANAL FISSURE REPAIR  06/24/2013  . APPENDECTOMY  2011  . COLONOSCOPY W/ BIOPSIES AND POLYPECTOMY  2012  . COLONSCOPY  2015  . EXCISIONAL HEMORRHOIDECTOMY    . FISSURECTOMY    . HEMORRHOID SURGERY  06/24/2013  . internal sphincterotomy  06-24-2013  . LUMBAR LAMINECTOMY/DECOMPRESSION MICRODISCECTOMY N/A 01/20/2015   Procedure: MICRO LUMBAR DECOMPRESSION L4-5/L3-4/L2-3;  Surgeon: Johnn Hai, MD;  [facility];  Service: Orthopedics;  Laterality: N/A;  . SHOULDER ARTHROSCOPY  08/22/2012   Procedure: ARTHROSCOPY SHOULDER;  Surgeon: Marin Shutter, MD;  Location: [facility];  Service: Orthopedics;  Laterality: Left;  Left shoulder arthroscopic lavage and debridement  . SHOULDER ARTHROSCOPY  09/02/2012   Procedure: ARTHROSCOPY SHOULDER;  Surgeon: Marin Shutter, MD;  Location: [place];  Service: Orthopedics;  Laterality: Left;  Left shoulder arthroscopy irrigation and debridement  . SHOULDER ARTHROSCOPY W/ ROTATOR CUFF REPAIR  07/09/2012   left  . SKIN LESION EXCISION     Benign  . WISDOM TOOTH EXTRACTION  YRS AGO   Family History  Problem Relation Age of Onset  . Heart disease Mother 54    Deceased  . Hyperlipidemia Mother   . Hypertension Mother   . Heart disease Father 37    Deceased  . CVA Father   . Hypertension Maternal Grandmother   . Hyperlipidemia Maternal Grandmother   . Cancer Maternal Grandmother     Colon Cancer  . Heart disease Maternal Grandfather   . Hypertension Maternal Grandfather   . Hyperlipidemia Maternal Grandfather   . Cancer Maternal Grandfather     Lung Cancer  . Heart attack Maternal Grandfather   . Heart disease Paternal Grandfather    Current Outpatient Prescriptions  Medication Sig Dispense Refill  . ALLERGIST [name] See admin instructions.  6  . Blood Glucose Monitoring Suppl (RELION CONFIRM GLUCOSE MONITOR) W/DEVICE KIT USE AS DIRECTED TO TEST BLOOD GLUCOSE DAILY DX: 250.00 1 kit 0  . canagliflozin (INVOKANA) 100 MG TABS tablet Take 1 tablet (100 mg total) by mouth daily before breakfast. 62 tablet 1  . Cholecalciferol (VITAMIN D-3) 5000 UNITS TABS Take 1 tablet by mouth daily.    Marland Kitchen CLOMIPHENE  CITRATE PO Take 25 mg by mouth daily.    Marland Kitchen docusate sodium (COLACE) 100 MG capsule Take 200 mg by mouth daily.     Marland Kitchen EPIPEN 2-PAK 0.3 MG/0.3ML SOAJ injection Inject 0.3 mg into the skin as needed (allergies).     . fenofibrate (TRICOR) 145 MG tablet Take 1 tablet (145 mg total) by mouth daily. 30 tablet 11  . gabapentin (NEURONTIN) 300 MG capsule Take 300 mg by mouth at bedtime.    . hydrochlorothiazide (HYDRODIURIL) 25 MG tablet TAKE 1 TABLET (25 MG TOTAL) BY MOUTH DAILY. 90 tablet 1  . LIVALO 4 MG TABS TAKE 1 TABLET (4 MG TOTAL) BY MOUTH DAILY. 30 tablet 3  . magnesium  oxide (MAG-OX) 400 MG tablet Take 800 mg by mouth daily.    . methocarbamol (ROBAXIN) 500 MG tablet Take 1 tablet (500 mg total) by mouth every 8 (eight) hours as needed for muscle spasms. 90 tablet 1  . omeprazole (PRILOSEC) 20 MG capsule TAKE 1 CAPSULE (20 MG TOTAL) BY MOUTH DAILY. 90 capsule 1  . [name] Take 1 tablet by mouth daily. Reported on 01/11/2016    . pyridoxine (B-6) 200 MG tablet Take 200 mg by mouth daily.    . rivaroxaban (XARELTO) 10 MG TABS tablet Take 10 mg by mouth daily.    Marland Kitchen VASCEPA 1 g CAPS TAKE 2 CAPSULES BY MOUTH TWICE A DAY AS DIRECTED 120 capsule 3  . zolpidem (AMBIEN) 5 MG tablet Take 1 tablet (5 mg total) by mouth at bedtime as needed. for sleep 30 tablet 0   Allergies  Allergen Reactions  . Tetanus Toxoids Anaphylaxis  . Daptomycin Other (See Comments)    myalgias  . Asa [Aspirin] Nausea Only and Rash  . Citrus Diarrhea    All citrus fruits  . Milk-Related Compounds Diarrhea    Milk and milk products  . Oxycodone   . Penicillins Rash  . Prednisone (Pak) [Prednisone] Other (See Comments)    Hyperactivity WITH ORAL, CAN TAKE SHOTS-Cannot do Dose pack   Social History   Social History  . Marital status: Married   Social History Main Topics  . Smoking status: Never Smoker  . Smokeless tobacco: Never Used     Comment: NEVER USED TOBACCO  . Alcohol use No  . Drug use: No   Social History Narrative   Marital Status: Married Environmental consultant)    Children:  Step Daughter Sharyn Lull)    Pets: None    Living Situation: Lives with Pamala Hurry    Occupation: [employer] ([place])    Education: Associate's Degree in [employer]    Tobacco Use/Exposure:  None    Alcohol Use:  Rarely    Drug Use:  None   Diet:  Regular   Exercise: Limited     Hobbies:  Hunting, Fishing              Objective: VITAL SIGNS: BP 120/60 (BP Location: Left Arm, Patient Position: Sitting, Cuff Size: Normal)   Pulse 75   Temp 98.1 F (36.7 C) (Oral)   Ht 5'  8" (1.727 m)   Wt 230 lb (104.3 kg)   SpO2 97%   BMI 34.97 kg/m  Constitutional: Well formed, well developed. No acute distress. Cardiovascular: Brisk cap refill Thorax & Lungs: No accessory muscle use Skin: Warm. Dry. No erythema. No rash.  Musculoskeletal: R 4th toe.   Normal active range of motion: yes.   Normal passive range of motion:  yes Tenderness to palpation: yes- over the prox phalanx Deformity: no Ecchymosis: yes Neurologic: Normal sensory function. Marland Kitchen Psychiatric: Normal mood. Age appropriate judgment and insight. Alert & oriented x 3.    Assessment:  Injury of toe on right foot, initial encounter - Plan: DG Toe 4th Right, DG Toe 4th Right  Plan: Orders as above. Xr unofficially shows a nondisplaced fx of prox phalanx. I don't believe it is part of joint, but if it is, will refer to ortho. [place] if fx is in joint. Flat shoe given. Ibuprofen, ice, acetaminophen. F/u in 3 weeks. The patient voiced understanding and agreement to the plan.   [place], DO 02/05/17  4:19 PM

## 2017-02-05 NOTE — Progress Notes (Signed)
Pre visit review using our clinic review tool, if applicable. No additional management support is needed unless otherwise documented below in the visit note. 

## 2017-02-06 ENCOUNTER — Other Ambulatory Visit: Payer: Self-pay | Admitting: Family Medicine

## 2017-02-07 ENCOUNTER — Other Ambulatory Visit: Payer: Self-pay | Admitting: Family Medicine

## 2017-02-07 DIAGNOSIS — S92501A Displaced unspecified fracture of right lesser toe(s), initial encounter for closed fracture: Secondary | ICD-10-CM

## 2017-02-09 ENCOUNTER — Other Ambulatory Visit: Payer: Self-pay | Admitting: Physician Assistant

## 2017-02-17 ENCOUNTER — Other Ambulatory Visit: Payer: Self-pay | Admitting: Physician Assistant

## 2017-02-26 ENCOUNTER — Ambulatory Visit: Payer: BLUE CROSS/BLUE SHIELD | Admitting: Family Medicine

## 2017-04-02 ENCOUNTER — Other Ambulatory Visit: Payer: BLUE CROSS/BLUE SHIELD

## 2017-04-15 ENCOUNTER — Other Ambulatory Visit: Payer: Self-pay | Admitting: Physician Assistant

## 2017-04-16 ENCOUNTER — Ambulatory Visit: Payer: BLUE CROSS/BLUE SHIELD

## 2017-04-19 IMAGING — CR DG TOE 4TH 2+V*R*
3 series · 3 of 3 positions shown · non-contrast
Comparison: None.

CLINICAL DATA: Hit toe on something, with bruising at the right
fourth toe. Initial encounter.

EXAM:
RIGHT FOURTH TOE

[t toes ap right]
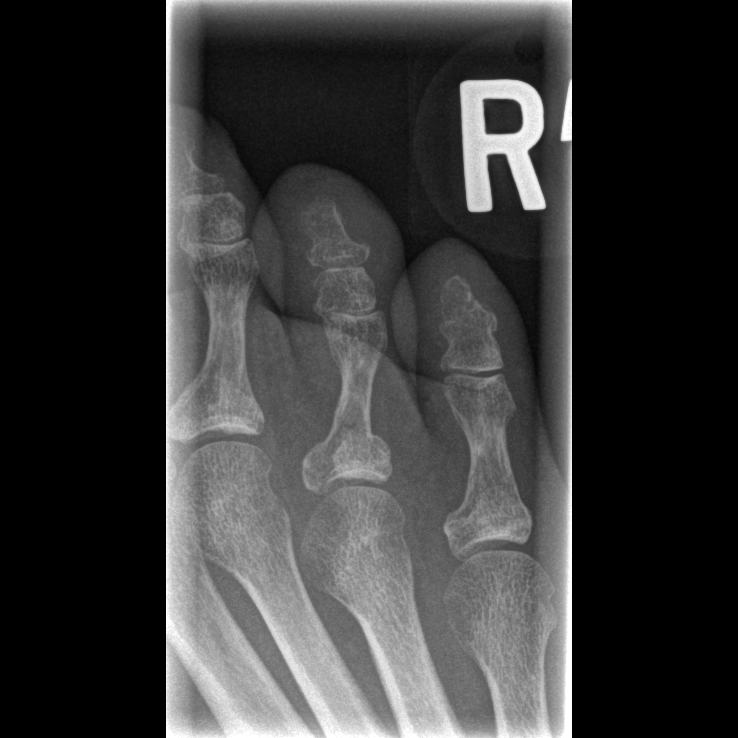

[t toes oblique right]
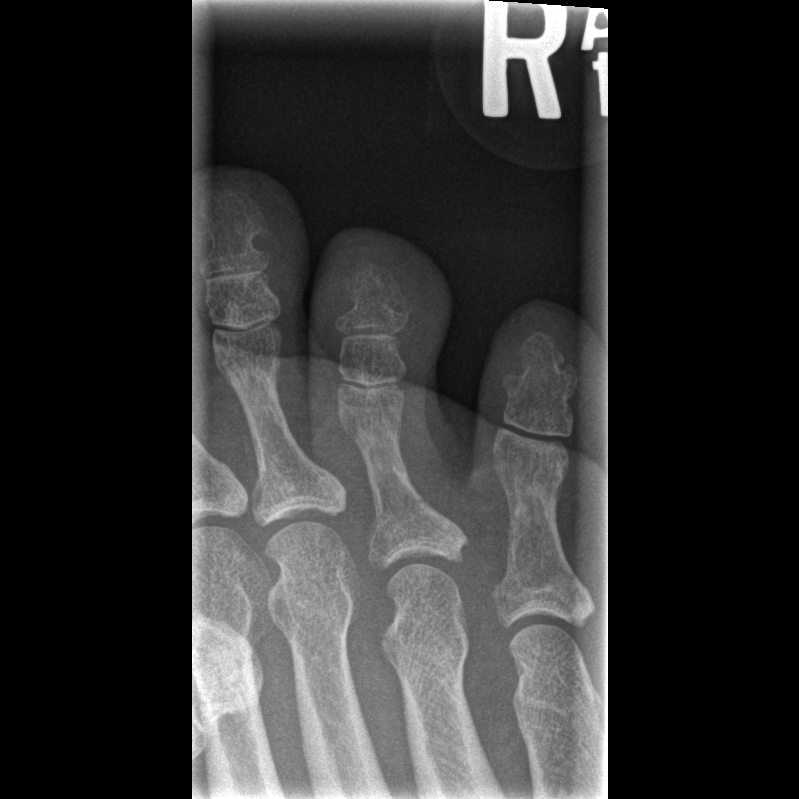

[t toes lateral right]
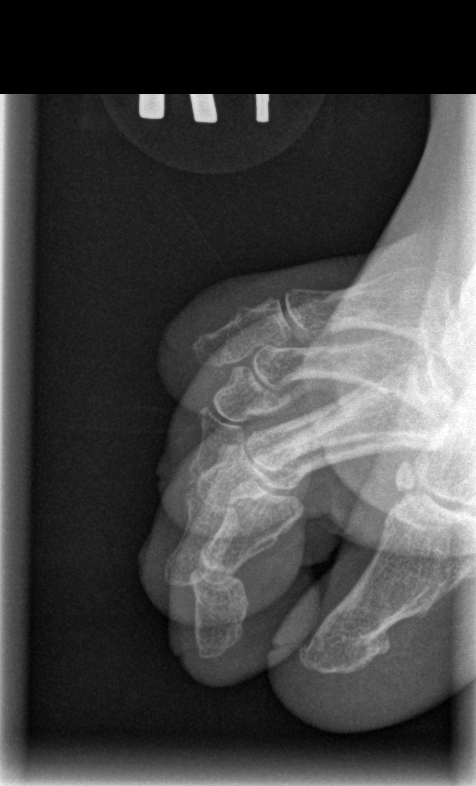

[3 of 3 positions shown; findings below may reference images not displayed]

FINDINGS: There is a mildly displaced fracture at the proximal aspect of the
fourth proximal phalanx. Apparent intra-articular extension is
noted. No additional fractures are seen. No definite soft tissue
abnormalities are characterized on radiograph.
IMPRESSION: Mildly displaced fracture at the proximal aspect of the fourth
proximal phalanx.

## 2017-04-25 ENCOUNTER — Other Ambulatory Visit: Payer: Self-pay | Admitting: Physician Assistant

## 2017-04-30 ENCOUNTER — Other Ambulatory Visit (HOSPITAL_BASED_OUTPATIENT_CLINIC_OR_DEPARTMENT_OTHER): Payer: BLUE CROSS/BLUE SHIELD

## 2017-04-30 ENCOUNTER — Ambulatory Visit (HOSPITAL_BASED_OUTPATIENT_CLINIC_OR_DEPARTMENT_OTHER): Payer: BLUE CROSS/BLUE SHIELD | Admitting: Hematology & Oncology

## 2017-04-30 VITALS — BP 128/79 | HR 67 | Temp 98.5°F | Resp 18 | Wt 222.8 lb

## 2017-04-30 DIAGNOSIS — I2699 Other pulmonary embolism without acute cor pulmonale: Secondary | ICD-10-CM

## 2017-04-30 DIAGNOSIS — I824Z2 Acute embolism and thrombosis of unspecified deep veins of left distal lower extremity: Secondary | ICD-10-CM

## 2017-04-30 DIAGNOSIS — I82512 Chronic embolism and thrombosis of left femoral vein: Secondary | ICD-10-CM

## 2017-04-30 DIAGNOSIS — Z7901 Long term (current) use of anticoagulants: Secondary | ICD-10-CM | POA: Diagnosis not present

## 2017-04-30 DIAGNOSIS — Z86711 Personal history of pulmonary embolism: Secondary | ICD-10-CM | POA: Diagnosis not present

## 2017-04-30 LAB — CBC WITH DIFFERENTIAL (CANCER CENTER ONLY)
BASO#: 0 10*3/uL (ref 0.0–0.2)
BASO%: 0.3 % (ref 0.0–2.0)
EOS%: 3.7 % (ref 0.0–7.0)
Eosinophils Absolute: 0.2 10*3/uL (ref 0.0–0.5)
HEMATOCRIT: 48.2 % (ref 38.7–49.9)
HEMOGLOBIN: 17.1 g/dL (ref 13.0–17.1)
LYMPH#: 1.7 10*3/uL (ref 0.9–3.3)
LYMPH%: 27 % (ref 14.0–48.0)
MCH: 32.3 pg (ref 28.0–33.4)
MCHC: 35.5 g/dL (ref 32.0–35.9)
MCV: 91 fL (ref 82–98)
MONO#: 0.7 10*3/uL (ref 0.1–0.9)
MONO%: 11.3 % (ref 0.0–13.0)
NEUT%: 57.7 % (ref 40.0–80.0)
NEUTROS ABS: 3.6 10*3/uL (ref 1.5–6.5)
Platelets: 114 10*3/uL — ABNORMAL LOW (ref 145–400)
RBC: 5.29 10*6/uL (ref 4.20–5.70)
RDW: 13.8 % (ref 11.1–15.7)
WBC: 6.3 10*3/uL (ref 4.0–10.0)

## 2017-04-30 LAB — COMPREHENSIVE METABOLIC PANEL
ALT: 28 U/L (ref 0–55)
AST: 26 U/L (ref 5–34)
Albumin: 3.9 g/dL (ref 3.5–5.0)
Alkaline Phosphatase: 50 U/L (ref 40–150)
Anion Gap: 9 mEq/L (ref 3–11)
BUN: 14.8 mg/dL (ref 7.0–26.0)
CALCIUM: 9.6 mg/dL (ref 8.4–10.4)
CHLORIDE: 105 meq/L (ref 98–109)
CO2: 30 mEq/L — ABNORMAL HIGH (ref 22–29)
Creatinine: 1.1 mg/dL (ref 0.7–1.3)
EGFR: 75 mL/min/{1.73_m2} — ABNORMAL LOW (ref >90)
GLUCOSE: 94 mg/dL (ref 70–140)
POTASSIUM: 3.9 meq/L (ref 3.5–5.1)
Sodium: 144 mEq/L (ref 136–145)
Total Bilirubin: 1.06 mg/dL (ref 0.20–1.20)
Total Protein: 6.8 g/dL (ref 6.4–8.3)

## 2017-04-30 NOTE — Progress Notes (Signed)
Hematology and Oncology Follow Up Visit  Ian [name] [id number] 08-Mar-1961 56 y.o. 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    Interim History:  Ian [name] is here today for a follow-up. He is doing okay. He has had no problems with the Xarelto.  Of note, he is on clomiphene for his testosterone. This makes him feel a lot better. He'll be on lifelong clomiphene.  Because he is on clomiphene, I think that his risk for another thrombus goes up significantly. Since he had a significant pulmonary emboli and lower extremity thrombus, I do not want to see this happen again. As such, I believe that he needs to be on lifelong low-dose Xarelto.  I talked to him about this. I explained why I thought Xarelto would be a good choice for him. He agrees.  He is having some issues with his diabetes. He is trying to manage this aggressively.  He has occasional pain in the left lower leg. He's had some occasional swelling. He does have neuropathy, that being from his diabetes.  He's had no bleeding.  Overall, his performance status is ECOG 1.  Medications:  Allergies as of 04/30/2017      Reactions   Tetanus Toxoids Anaphylaxis   Daptomycin Other (See Comments)   myalgias   Asa [aspirin] Nausea Only, Rash   Citrus Diarrhea   All citrus fruits   Milk-related Compounds Diarrhea   Milk and milk products   Oxycodone    Penicillins Rash   Prednisone (pak) [prednisone] Other (See Comments)   Hyperactivity WITH ORAL, CAN TAKE SHOTS-Cannot do Dose pack      Medication List       Accurate as of 04/30/17 11:12 AM. Always use your most recent med list.          ALLERGIST TRAY 1/2CC 27GX1/2" 27G X 1/2" 0.5 ML Kit Generic drug:  Tuberculin-Allergy Syringes USE AS DIRECTED WEEKLY FOR ALLERGY INJECTIONS   canagliflozin 100 MG Tabs  tablet Commonly known as:  INVOKANA Take 1 tablet (100 mg total) by mouth daily before breakfast.   clomiPHENE 50 MG tablet Commonly known as:  CLOMID Take 25 mg by mouth daily.   [name] Take 200 mg by mouth daily.   [name] Generic drug:  EPINEPHrine Inject 0.3 mg into the skin as needed (allergies).   [name] Take 1 tablet (145 mg total) by mouth daily.   [name] TAKE 1 TABLET (25 MG TOTAL) BY MOUTH DAILY.   [name] TAKE 1 TABLET (4 MG TOTAL) BY MOUTH DAILY.   magnesium oxide 400 MG tablet Commonly known as:  MAG-OX Take 800 mg by mouth daily.   [name] Take 1 tablet (500 mg total) by mouth every 8 (eight) hours as needed for muscle spasms.   [name] TAKE 1 CAPSULE (20 MG TOTAL) BY MOUTH DAILY.   [name] Take 1 tablet by mouth daily. Reported on 01/11/2016   pyridoxine 200 MG tablet Commonly known as:  B-6 Take 200 mg by mouth daily.  [name] USE AS DIRECTED TO TEST BLOOD GLUCOSE DAILY DX: 250.00   [name] Take 10 mg by mouth daily.   [name] TAKE 2 CAPSULES BY MOUTH TWICE A DAY AS DIRECTED   [name] Take 1 tablet by mouth daily.   [name] Take 1 tablet (5 mg total) by mouth at bedtime as needed. for sleep       Allergies:  Allergies  Allergen Reactions  . Tetanus Toxoids Anaphylaxis  . Daptomycin Other (See Comments)    myalgias  . Asa [Aspirin] Nausea Only and Rash  . Citrus Diarrhea    All citrus fruits  . Milk-Related  Compounds Diarrhea    Milk and milk products  . Oxycodone   . Penicillins Rash  . Prednisone (Pak) [Prednisone] Other (See Comments)    Hyperactivity WITH ORAL, CAN TAKE SHOTS-Cannot do Dose pack    Past Medical History, Surgical history, Social history, and Family History were reviewed and updated.  Review of Systems: All other 10 point review of systems is negative.   Physical Exam:  weight is 222 lb 12.8 oz (101.1 kg). His oral temperature is 98.5 F (36.9 C). His blood pressure is 128/79 and his pulse is 67. His respiration is 18 and oxygen saturation is 99%.   Wt Readings from Last 3 Encounters:  04/30/17 222 lb 12.8 oz (101.1 kg)  02/05/17 230 lb (104.3 kg)  01/29/17 227 lb 9.6 oz (103.2 kg)    Head and neck exam shows no ocular or oral lesions. There are no palpable cervical or supraclavicular lymph nodes. Lungs are clear. Cardiac exam regular rate and rhythm with no murmurs, rubs or bruits. Abdomen is soft. Has good bowel sounds. There is no fluid wave. There is no palpable liver or spleen tip. Back exam shows no tenderness over the spine, ribs or hips. Extremities shows no clubbing, cyanosis or edema. No venous cords noted in the left leg  and has a negative Homans sign with the left leg. Neurological exam is nonfocal. Skin exam shows no rashes, ecchymoses or petechia.  Lab Results  Component Value Date   WBC 6.3 04/30/2017   HGB 17.1 04/30/2017   HCT 48.2 04/30/2017   MCV 91 04/30/2017   PLT 114 (L) 04/30/2017   No results found for: FERRITIN, IRON, TIBC, UIBC, IRONPCTSAT Lab Results  Component Value Date   RBC 5.29 04/30/2017   No results found for: KPAFRELGTCHN, LAMBDASER, KAPLAMBRATIO No results found for: IGGSERUM, IGA, IGMSERUM No results found for: Odetta Pink, SPEI   Chemistry      Component Value Date/Time   NA 141 10/30/2016 1052   K 3.9 10/30/2016 1052   CL 99 08/21/2016 1400   CL 101 08/23/2015  0758   CO2 23 10/30/2016 1052   BUN 20.0 10/30/2016 1052   CREATININE 1.1 10/30/2016 1052      Component Value Date/Time   CALCIUM 9.5 10/30/2016 1052   ALKPHOS 39 (L) 12/11/2016 1108   ALKPHOS 64 10/30/2016 1052   AST 30 12/11/2016 1108   AST 29 10/30/2016 1052   ALT 26 12/11/2016 1108   ALT 24 10/30/2016 1052   BILITOT 0.7 12/11/2016 1108   BILITOT 0.53 10/30/2016 1052     Impression and Plan: Ian [name] is a 56 yo white  male with thrombus in the left leg and bilateral PE's. PE's had resolved on CT angio in August 2016. 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 He developed his thrombus after having surgery and being immobile for an extended period of time.   He has had a nice response on Xarelto and is now on low dose at 10 mg PO daily.   Again, I think that given the fact that he is on clomiphene to help his testosterone level, I really think that he needs to be on some form of low-dose maintenance anticoagulation. I think Xarelto would be a very good idea for him. He's done well with this so far.  We will have him come back in 6 months. I think if all is skin 6 months, then we can go yearly.   Volanda Napoleon, MD 5/7/201811:12 AM

## 2017-05-07 ENCOUNTER — Other Ambulatory Visit: Payer: BLUE CROSS/BLUE SHIELD | Admitting: *Deleted

## 2017-05-07 DIAGNOSIS — E781 Pure hyperglyceridemia: Secondary | ICD-10-CM

## 2017-05-07 LAB — LIPID PANEL
Chol/HDL Ratio: 5.2 ratio — ABNORMAL HIGH (ref 0.0–5.0)
Cholesterol, Total: 130 mg/dL (ref 100–199)
HDL: 25 mg/dL — AB (ref >39)
LDL Calculated: 35 mg/dL (ref 0–99)
TRIGLYCERIDES: 350 mg/dL — AB (ref 0–149)
VLDL CHOLESTEROL CAL: 70 mg/dL — AB (ref 5–40)

## 2017-05-07 LAB — LDL CHOLESTEROL, DIRECT: LDL Direct: 58 mg/dL (ref 0–99)

## 2017-05-14 ENCOUNTER — Other Ambulatory Visit: Payer: Self-pay | Admitting: Physician Assistant

## 2017-05-14 ENCOUNTER — Ambulatory Visit (INDEPENDENT_AMBULATORY_CARE_PROVIDER_SITE_OTHER): Payer: BLUE CROSS/BLUE SHIELD | Admitting: Pharmacist

## 2017-05-14 DIAGNOSIS — E781 Pure hyperglyceridemia: Secondary | ICD-10-CM | POA: Diagnosis not present

## 2017-05-14 NOTE — Progress Notes (Signed)
Patient ID: [name]                 DOB: 1961/09/01                    MRN: [id number]     HPI: Ian [name] a pleasant 56 y.o.malepatient referred to lipid clinic by Dr. Acie Fredrickson for elevated triglycerides who presents today for follow up. PMH is significant for HLD, elevated TG, DM, HTN, and history of PE following back surgery. At last visit, pt was switched from fenofibrate capsules to tablets to see if this would help with pt's constipation which was affecting adherence. Pt reports tolerating tablets better.  TG have been as high as 1700 previously, dropped to ~600 6 weeks later. Pt reported he ate less beef during this time and that is the only thing he did differently. He is intolerant to atorvastatin and rosuvastatin, currently tolerating pitavastatin. Tolerating fenofibrate tablets better than capsules, however he is still having some constipation. He will use natural laxatives which helps. He stopped his fenofibrate for 1 week and his symptoms resolved, however he did ultimately restart his fenofibrate for his TG. He does not drink alcohol and avoids sugary foods. Watches his carbs since he is diabetic. Most recent A1c from 3 months ago is 6.1%, pt reports fasting CBGs in the 90s.   Pt has lost close to 50 lbs and is focusing on his dietary improvements. He is eating a lot of lean chicken and is walking 3 miles a day. He did have 2 illnesses recently and admits to dietary indiscretion during this time. States he has had more comfort food and beef than normal.  Current Medications: Livalo 59m daily, fenofibrate 1434mtablet daily, Vascepa 4g daily Intolerances: atorvastatin 10 and 2031maily, rosuvastatin - muscle aches with both, gemfibrozil 600m69mD, Zetia 10mg32mly, niacin - flushing, Welchol 625mg 52mbs BID, fenofibrate 134mg c62mles - constipation TG goal: <150, LDL goal < 100 for primary prevention with risk factors (DM and HTN)  Diet: Eats greens, beef - 3x a week  and has since cut out. Doesn't drink alcohol. Diabetic - knows carbohydrate sources well. Doesn't eat sweets. Focusing more on lean chicken. Eats 2 eggs most days a week.  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   Family History: Maternal grandfather with heart disease and MI, mother was a smoker with CAD, dad died from gallbladder complications.  Social History: Denies alcohol use, cigarette use, or illicit drug use.  Labs: 05/07/17: TC 130, TG 350, HDL 25, LDL-D 58 (Livalo 4mg dai55m Vascepa 4g, fenofibrate 145mg dai73m2/5/18: Hgb A1c 6.1% 12/11/16: TC 121, TG 212, HDL 26, LDL 53 (Livalo 4mg, Vasc61m 4g, fenofibrate 145mg) 08/221m TC 163, TG 545, HDL 27.3, LDL- D 35, A1C 6.6% (Livalo 4mg, Vascep18mg, fenofibrate 145mg) 5/30/151mC 168, TG 617, HDL 25.9, LDL-D 26 (Livalo 4mg, Vascepa 18m fenofibrate 134mg but repor44mating less meat) 04/11/16: TC 289, TG 1702, HDL 21.6, LDL-D 36 (Livalo 4mg, Vascepa 4g45menofibrate 134mg). A1c 7.9% 54mt Medical History:  Diagnosis Date  . Allergy   . Arthritis   . Biceps tendon rupture    bilateral  . Clostridium difficile infection 2014  . Colon polyps   . Diabetes (HCC)    [place] [facility] [place]  . GERD (gastroesophageal reflux disease)   . Hyperlipidemia   . Hypertension   . Internal bleeding hemorrhoids 2012   "might bleed once/month; only when I strain"  .  Internal prolapsed hemorrhoids   . Leg cramps   . Testicular hypofunction     Current Outpatient Prescriptions on File Prior to Visit  Medication Sig Dispense Refill  . ALLERGIST TRAY 1/2CC 27GX1/2" 27G X 1/2" 0.5 ML KIT USE AS DIRECTED WEEKLY FOR ALLERGY INJECTIONS  6  . Blood Glucose Monitoring Suppl (RELION CONFIRM GLUCOSE MONITOR) W/DEVICE KIT USE AS DIRECTED TO TEST BLOOD GLUCOSE DAILY DX: 250.00 1 kit 0  . canagliflozin (INVOKANA) 100 MG TABS tablet Take 1 tablet (100 mg total) by mouth daily before breakfast. 30 tablet 5  . Cholecalciferol (VITAMIN  D-3) 5000 UNITS TABS Take 1 tablet by mouth daily.    . clomiPHENE (CLOMID) 50 MG tablet Take 25 mg by mouth daily.  11  . docusate sodium (COLACE) 100 MG capsule Take 200 mg by mouth daily.     Marland Kitchen EPIPEN 2-PAK 0.3 MG/0.3ML SOAJ injection Inject 0.3 mg into the skin as needed (allergies).     . fenofibrate (TRICOR) 145 MG tablet Take 1 tablet (145 mg total) by mouth daily. 30 tablet 11  . hydrochlorothiazide (HYDRODIURIL) 25 MG tablet TAKE 1 TABLET (25 MG TOTAL) BY MOUTH DAILY. 90 tablet 1  . LIVALO 4 MG TABS TAKE 1 TABLET (4 MG TOTAL) BY MOUTH DAILY. 30 tablet 11  . magnesium oxide (MAG-OX) 400 MG tablet Take 800 mg by mouth daily.    . methocarbamol (ROBAXIN) 500 MG tablet Take 1 tablet (500 mg total) by mouth every 8 (eight) hours as needed for muscle spasms. 90 tablet 1  . omeprazole (PRILOSEC) 20 MG capsule TAKE 1 CAPSULE (20 MG TOTAL) BY MOUTH DAILY. 90 capsule 1  . [name] Take 1 tablet by mouth daily. Reported on 01/11/2016    . pyridoxine (B-6) 200 MG tablet Take 200 mg by mouth daily.    . rivaroxaban (XARELTO) 10 MG TABS tablet Take 10 mg by mouth daily.    Marland Kitchen VASCEPA 1 g CAPS TAKE 2 CAPSULES BY MOUTH TWICE A DAY AS DIRECTED 120 capsule 3  . zolpidem (AMBIEN) 5 MG tablet Take 1 tablet (5 mg total) by mouth at bedtime as needed. for sleep 30 tablet 0   No current facility-administered medications on file prior to visit.     Allergies  Allergen Reactions  . Tetanus Toxoids Anaphylaxis  . Daptomycin Other (See Comments)    myalgias  . Asa [Aspirin] Nausea Only and Rash  . Citrus Diarrhea    All citrus fruits  . Milk-Related Compounds Diarrhea    Milk and milk products  . Oxycodone   . Penicillins Rash  . Prednisone (Pak) [Prednisone] Other (See Comments)    Hyperactivity WITH ORAL, CAN TAKE SHOTS-Cannot do Dose pack    Assessment/Plan:  1. Hyperlipidemia - LDL remains at goal however TG elevated at 287m/dL. Pt is taking max dose of all appropriate TG lowering  medications including Livalo 473mdaily, fenofibrate 14587maily, and Vascepa 4g daily. Spent much of visit today focusing on lifestyle - pt admits to dietary indiscretion while he was sick a few times this year. Unfortunately all medications are optimized for TG lowering. Will see if pt qualifies for TG lowering study and follow up with pt.  Pt also mentions that he prefers to see a male cardiologist. Will send message to Dr NahAcie Fredrickson see if he is ok with this.  Neita Landrigan E. Supple, PharmD, CPP, [id number] [place] [address] [id number]: [phone]; Fax: [phone] 05/14/2017 7:52 AM   Addendum: Damaris Schooner with [facility] who is enrolling patients in the Prominent Trial. Patient qualifies for study due to fasting TG between 200-500, HDL < 40, T2DM, and on max tolerated statin. He will need a 6 week washout of his fenofibrate to qualify. This is fine since pt has been complaining of constipation with his fenofibrate. Pt is aware and will await call.

## 2017-05-14 NOTE — Patient Instructions (Addendum)
Continue taking your Livalo, Vascepa, and fenofibrate for your cholesterol  I'll make a referral for a cholesterol study to see if you qualify

## 2017-05-15 ENCOUNTER — Telehealth: Payer: Self-pay | Admitting: Pharmacist

## 2017-05-15 NOTE — Telephone Encounter (Signed)
Will route to scheduling - pt is currently scheduled for annual follow up with Dr Acie Fredrickson at the end of August, he would like this appt switched to Dr Meda Coffee if possible.

## 2017-05-15 NOTE — Telephone Encounter (Signed)
-----   Message from Thayer Headings, MD sent at 05/14/2017  5:04 PM EDT ----- OK with me  ----- Message ----- From: Dorothy Spark, MD Sent: 05/14/2017   3:49 PM To: Thayer Headings, MD, Leeroy Bock, [place]  I will be happy to see him if ok with Abbe [place].  ----- Message ----- From: Leeroy Bock, [facility] Sent: 05/14/2017   3:27 PM To: Dorothy Spark, MD, Thayer Headings, MD  I have seen Mr Swails in lipid clinic a few times. He stated today that he prefers to see a male cardiologist. I wanted to reach out to you both to see if you are ok with pt switching providers. If not, I can let him know.  Thanks, [employer]

## 2017-05-15 NOTE — Telephone Encounter (Addendum)
Contacted Ian [name] with [facility] who is enrolling patients in triglyceride studies. Pt looks to be a candidate for the Prominent Trial studying patients with fasting TG between 200-500, HDL < 40, DM, age > 63, on max tolerated statin. He would need a 6 week washout of his fenofibrate which is fine because pt was complaining of constipation associated with his fenofibrate at his office visit yesterday. He can continue his Livalo 4mg  daily and Vascepa 2g BID.   Will forward clinic note and lipids to Ian [name], fax [phone], pt is aware.

## 2017-05-16 ENCOUNTER — Telehealth: Payer: Self-pay | Admitting: Cardiovascular Disease

## 2017-05-16 NOTE — Telephone Encounter (Signed)
Ok with me 

## 2017-05-16 NOTE — Telephone Encounter (Signed)
New message     Onnie Graham, Harlon Flor [facility] Yesterday (7:15 AM)      Will route to scheduling - pt is currently scheduled for annual follow up with Dr Acie Fredrickson at the end of August, he would like this appt switched to Dr Meda Coffee if possible.

## 2017-05-17 ENCOUNTER — Telehealth: Payer: Self-pay | Admitting: Family Medicine

## 2017-05-17 NOTE — Telephone Encounter (Signed)
Both providers have already agreed to switch - please see original phone note from 5/22 that was routed to scheduling, thanks.

## 2017-05-17 NOTE — Telephone Encounter (Signed)
RF request came from [facility] stating pt requesting new RF for Xarelto 20 mg.  Please advise.

## 2017-05-18 NOTE — Telephone Encounter (Signed)
Can we find out exactly why he is taking this? I've never filled it for him and don't see a valid dx in the hx. TY.

## 2017-05-22 ENCOUNTER — Encounter: Payer: Self-pay | Admitting: Emergency Medicine

## 2017-05-22 ENCOUNTER — Other Ambulatory Visit: Payer: Self-pay | Admitting: Emergency Medicine

## 2017-05-22 MED ORDER — RIVAROXABAN 10 MG PO TABS: 10.0000 mg | ORAL_TABLET | Freq: Every day | ORAL | 0 refills | Status: DC

## 2017-05-22 NOTE — Telephone Encounter (Signed)
Refilled. Please disregard previous message. I see the hx of PE and DVT in chart. History portion updated. TY.

## 2017-05-23 ENCOUNTER — Other Ambulatory Visit: Payer: Self-pay | Admitting: *Deleted

## 2017-05-23 MED ORDER — RIVAROXABAN 10 MG PO TABS: 10.0000 mg | ORAL_TABLET | Freq: Every day | ORAL | 6 refills | Status: DC

## 2017-05-23 NOTE — Telephone Encounter (Signed)
Called and spoke with the pt regarding the refill request for the Xarelto.  Pt stated that Dr. Marin Olp prescribed the Xarelto when he had a blood clot, and after he saw Dr. Marin Olp on (04/30/17) he informed him that would like to keep him on the Xarelto 10mg .  Because he unable to take a baby aspirin.  Pt stated that he thought that the pharmacy would send the refill request to Dr. Marin Olp.  Pt asked if I would send the refill request to Dr. Marin Olp.  Called Dr. Antonieta Pert office and spoke with Thayer Headings, and she stated that she will refill the prescription.  Informed her that the refill request was for the 20mg , but the pt said he was told to take 10mg .  She stated that she will send in 10mg .//AB/CMA

## 2017-05-23 NOTE — Telephone Encounter (Signed)
Called and Ian [name] @ 9:06am @ [phone]) asking the pt to RTC regarding medication refill request.//AB/CMA

## 2017-05-23 NOTE — Telephone Encounter (Signed)
Rx denied.  Rx will be refilled by Dr. Antonieta Pert office.//AB/CMA

## 2017-05-23 NOTE — Telephone Encounter (Signed)
Patient returning call.

## 2017-06-03 ENCOUNTER — Other Ambulatory Visit: Payer: Self-pay | Admitting: Cardiovascular Disease

## 2017-06-25 ENCOUNTER — Ambulatory Visit (INDEPENDENT_AMBULATORY_CARE_PROVIDER_SITE_OTHER): Payer: BLUE CROSS/BLUE SHIELD | Admitting: Family Medicine

## 2017-06-25 VITALS — BP 118/78 | HR 74 | Temp 98.8°F | Ht 68.0 in | Wt 222.0 lb

## 2017-06-25 DIAGNOSIS — E785 Hyperlipidemia, unspecified: Secondary | ICD-10-CM | POA: Diagnosis not present

## 2017-06-25 DIAGNOSIS — E119 Type 2 diabetes mellitus without complications: Secondary | ICD-10-CM

## 2017-06-25 DIAGNOSIS — I10 Essential (primary) hypertension: Secondary | ICD-10-CM | POA: Diagnosis not present

## 2017-06-25 LAB — HEMOGLOBIN A1C: HEMOGLOBIN A1C: 6.1 % (ref 4.6–6.5)

## 2017-06-25 MED ORDER — ZOLPIDEM TARTRATE 5 MG PO TABS: 5.0000 mg | ORAL_TABLET | Freq: Every evening | ORAL | 0 refills | Status: DC | PRN

## 2017-06-25 NOTE — Patient Instructions (Addendum)
If you change your mind about seeing an obesity specialist, let us know and we will get it set up.   If you ever decide to go of of your diabetes medicine, let us know. With your control, we can make this decision at any time.   You don't have to check your sugars daily if you wish either.

## 2017-06-25 NOTE — Progress Notes (Signed)
Subjective:   Chief Complaint  Patient presents with  . Follow-up    6 month    [name] is a 56 y.o. male here for follow-up of diabetes.   [name]'s self monitored glucose range is 80-100's. Patient denies hypoglycemic reactions. He checks his glucose levels 1 time per day. Patient does not require insulin.   Medications include: Invokana 100 mg daily (recently decreased from 200 mg daily), failed Metformin due to AE's Patient exercises 0 days per week on average.   He does not take an aspirin daily. Statin? Yes ACEi/ARB? Yes  Hypertension Patient presents for hypertension follow up. He does monitor home blood pressures. Blood pressures ranging on average from 110's/70's. He is compliant with medications. Patient has these side effects of medication: none He is adhering to a healthy diet overall. Exercise: active at work, no scheduled exercise  Dyslipidemia Patient presents for dyslipidemia follow up. He follows with the lipid clinic. Started on a trial for new med under fibrate class with fewer side effects. Compliance with treatment thus far has been good. He denies myalgias. He is adhering to a low sodium and low fat diet. The patient exercises 0x/week The patient is not known to have coexisting coronary artery disease.  Past Medical History:  Diagnosis Date  . Allergy   . Arthritis   . Biceps tendon rupture    bilateral  . Clostridium difficile infection 2014  . Colon polyps   . Diabetes ([name])    Borderline/Pre-diabetes  . GERD (gastroesophageal reflux disease)   . History of DVT (deep vein thrombosis)   . History of pulmonary embolism   . Hyperlipidemia   . Hypertension   . Internal bleeding hemorrhoids 2012   "might bleed once/month; only when I strain"  . Internal prolapsed hemorrhoids   . Leg cramps   . Testicular hypofunction     Past Surgical History:  Procedure Laterality Date  . ANAL FISSURE REPAIR  06/24/2013  . APPENDECTOMY  2011  .  COLONOSCOPY W/ BIOPSIES AND POLYPECTOMY  2012  . COLONSCOPY  2015  . EXCISIONAL HEMORRHOIDECTOMY    . FISSURECTOMY    . HEMORRHOID SURGERY  06/24/2013  . internal sphincterotomy  06-24-2013  . LUMBAR LAMINECTOMY/DECOMPRESSION MICRODISCECTOMY N/A 01/20/2015   Procedure: MICRO LUMBAR DECOMPRESSION L4-5/L3-4/L2-3;  Surgeon: Johnn Hai, MD;  Location: [name];  Service: Orthopedics;  Laterality: N/A;  . SHOULDER ARTHROSCOPY  08/22/2012   Procedure: ARTHROSCOPY SHOULDER;  Surgeon: Marin Shutter, MD;  Location: [place];  Service: Orthopedics;  Laterality: Left;  Left shoulder arthroscopic lavage and debridement  . SHOULDER ARTHROSCOPY  09/02/2012   Procedure: ARTHROSCOPY SHOULDER;  Surgeon: Marin Shutter, MD;  Location: [facility];  Service: Orthopedics;  Laterality: Left;  Left shoulder arthroscopy irrigation and debridement  . SHOULDER ARTHROSCOPY W/ ROTATOR CUFF REPAIR  07/09/2012   left  . SKIN LESION EXCISION     Benign  . WISDOM TOOTH EXTRACTION  YRS AGO    Social History   Social History  . Marital status: Married   Social History Main Topics  . Smoking status: Never Smoker  . Smokeless tobacco: Never Used     Comment: NEVER USED TOBACCO  . Alcohol use No  . Drug use: No  . Sexual activity: Not Currently   Social History Narrative   Marital Status: Married [name])    Children:  Step Daughter [name])    Pets: None    Living Situation: Lives with [name]    Occupation:  [name] ([name])    Education: Associate's Degree in [name]    Tobacco Use/Exposure:  None    Alcohol Use:  Rarely    Drug Use:  None   Diet:  Regular   Exercise: Limited     Hobbies:  Hunting, Fishing              Current Outpatient Prescriptions on File Prior to Visit  Medication Sig Dispense Refill  . ALLERGIST TRAY 1/2CC 27GX1/2" 27G X 1/2" 0.5 ML KIT USE AS DIRECTED WEEKLY FOR ALLERGY INJECTIONS  6  . Blood Glucose Monitoring Suppl (RELION CONFIRM GLUCOSE MONITOR) W/DEVICE KIT USE  AS DIRECTED TO TEST BLOOD GLUCOSE DAILY DX: 250.00 1 kit 0  . canagliflozin (INVOKANA) 100 MG TABS tablet Take 1 tablet (100 mg total) by mouth daily before breakfast. 30 tablet 5  . Cholecalciferol (VITAMIN D-3) 5000 UNITS TABS Take 1 tablet by mouth daily.    . clomiPHENE (CLOMID) 50 MG tablet Take 25 mg by mouth daily.  11  . docusate sodium (COLACE) 100 MG capsule Take 200 mg by mouth daily.     Marland Kitchen EPIPEN 2-PAK 0.3 MG/0.3ML SOAJ injection Inject 0.3 mg into the skin as needed (allergies).     . hydrochlorothiazide (HYDRODIURIL) 25 MG tablet TAKE 1 TABLET (25 MG TOTAL) BY MOUTH DAILY. 90 tablet 1  . LIVALO 4 MG TABS TAKE 1 TABLET (4 MG TOTAL) BY MOUTH DAILY. 30 tablet 11  . magnesium oxide (MAG-OX) 400 MG tablet Take 800 mg by mouth daily.    . methocarbamol (ROBAXIN) 500 MG tablet Take 1 tablet (500 mg total) by mouth every 8 (eight) hours as needed for muscle spasms. 90 tablet 1  . omeprazole (PRILOSEC) 20 MG capsule TAKE 1 CAPSULE (20 MG TOTAL) BY MOUTH DAILY. 90 capsule 1  . [name] Take 1 tablet by mouth daily. Reported on 01/11/2016    . pyridoxine (B-6) 200 MG tablet Take 200 mg by mouth daily.    . rivaroxaban (XARELTO) 10 MG TABS tablet Take 1 tablet (10 mg total) by mouth daily. 30 tablet 6  . VASCEPA 1 g CAPS TAKE 2 CAPSULES BY MOUTH TWICE A DAY AS DIRECTED 120 capsule 3  . zolpidem (AMBIEN) 5 MG tablet Take 1 tablet (5 mg total) by mouth at bedtime as needed. for sleep 30 tablet 0   Related testing: Foot exam(monofilament and inspection):done Retinal exam:done Date of retinal exam: 08/2016  Done by:  Dr. Bing Plume Pneumovax: done Flu Shot: done  Review of Systems: Eye:  No recent significant change in vision Pulmonary:  No SOB Cardiovascular:  No chest pain, no palpitations Skin/Integumentary ROS:  No abnormal skin lesions reported  Objective:  BP 118/78 (BP Location: Left Arm, Patient Position: Sitting, Cuff Size: Normal)   Pulse 74   Temp 98.8 F (37.1 C)  (Oral)   Ht _0  (1.727 m)   Wt 222 lb (100.7 kg)   SpO2 96%   BMI 33.75 kg/m  General:  Well developed, well nourished, in no apparent distress Skin:  Warm, no pallor or diaphoresis Head:  Normocephalic, atraumatic Eyes:  Pupils equal and round, sclera anicteric without injection  Nose:  External nares without trauma, no discharge Throat/Pharynx:  Lips and gingiva without lesion Neck: Neck supple.  No obvious thyromegaly or masses.  No bruits Lungs:  clear to auscultation, breath sounds equal bilaterally, no wheezes, rales, or stridor Cardio:  regular rate and rhythm without murmurs, no  bruits, no LE edema Musculoskeletal:  Symmetrical muscle groups noted without atrophy or deformity Neuro:  Sensation intact to pinprick on feet Psych: Age appropriate judgment and insight  Assessment:   Type 2 diabetes mellitus without complication, without long-term current use of insulin (HCC) - Plan: Hemoglobin A1c, HM Diabetes Foot Exam  Essential hypertension, benign  Hyperlipidemia, unspecified hyperlipidemia type   Plan:   Orders as above. Discussed coming off of Inovkana 100 mg altogether, however the patient receives peace of mind with being on it. Okay to stay on it resolves his labs today in good form. I told him he can check her sugars as much as he wants or as little as he wants now. He prefers to continue checking it daily. Blood pressure is under good control. Eager to see how new trial works for him. Stay on the [place]. F/u in 6 mo or prn. The patient voiced understanding and agreement to the plan.  [place], DO 06/25/17 12:17 PM

## 2017-06-25 NOTE — Progress Notes (Signed)
Pre visit review using our clinic review tool, if applicable. No additional management support is needed unless otherwise documented below in the visit note. 

## 2017-07-08 ENCOUNTER — Other Ambulatory Visit: Payer: Self-pay | Admitting: Family Medicine

## 2017-07-09 NOTE — Telephone Encounter (Signed)
Rx denied.  Pt needs to contact the Lipid clinic.//AB/CMA

## 2017-07-16 ENCOUNTER — Other Ambulatory Visit: Payer: Self-pay | Admitting: *Deleted

## 2017-07-16 ENCOUNTER — Other Ambulatory Visit: Payer: Self-pay | Admitting: Family Medicine

## 2017-07-16 MED ORDER — ICOSAPENT ETHYL 1 G PO CAPS: ORAL_CAPSULE | ORAL | 3 refills | Status: DC

## 2017-07-16 NOTE — Telephone Encounter (Signed)
Rx approved and sent to the pharmacy by e-script.//AB/CMA 

## 2017-08-20 ENCOUNTER — Ambulatory Visit: Payer: BLUE CROSS/BLUE SHIELD | Admitting: Cardiovascular Disease

## 2017-08-27 ENCOUNTER — Emergency Department (INDEPENDENT_AMBULATORY_CARE_PROVIDER_SITE_OTHER)
Admission: EM | Admit: 2017-08-27 | Discharge: 2017-08-27 | Disposition: A | Discharge status: Home / Self Care | Payer: BLUE CROSS/BLUE SHIELD | Source: Home / Self Care | Attending: Family Medicine | Admitting: Family Medicine

## 2017-08-27 ENCOUNTER — Encounter: Payer: Self-pay | Admitting: Emergency Medicine

## 2017-08-27 DIAGNOSIS — R05 Cough: Secondary | ICD-10-CM

## 2017-08-27 DIAGNOSIS — J069 Acute upper respiratory infection, unspecified: Secondary | ICD-10-CM

## 2017-08-27 DIAGNOSIS — R053 Chronic cough: Secondary | ICD-10-CM

## 2017-08-27 MED ORDER — BENZONATATE 100 MG PO CAPS: 100.0000 mg | ORAL_CAPSULE | Freq: Three times a day (TID) | ORAL | 0 refills | Status: DC

## 2017-08-27 MED ORDER — DOXYCYCLINE HYCLATE 100 MG PO CAPS: 100.0000 mg | ORAL_CAPSULE | Freq: Two times a day (BID) | ORAL | 0 refills | Status: DC

## 2017-08-27 NOTE — ED Provider Notes (Signed)
[name]    CSN: [id number] Arrival date & time: 08/27/17  1400     History   Chief Complaint Chief Complaint  Patient presents with  . Cough    HPI HURIEL MATT is a 56 y.o. male.   HPI  MONTA POLICE is a 56 y.o. male presenting to UC with c/o 3 weeks of gradually worsening productive cough with yellow sputum.  He has taken Robitussin with temporary relief.  He is concerned he may have a sinus infection but denies HA or facial pain/pressure.  Denies fever, chills, n/v/d. No known sick contacts.  He is allergic to Penicillin- rash, but has had azithromycin and doxycycline before. Pt states Z-packs "never work."    Past Medical History:  Diagnosis Date  . Allergy   . Arthritis   . Biceps tendon rupture    bilateral  . Clostridium difficile infection 2014  . Colon polyps   . Diabetes ([place])    Borderline/Pre-diabetes  . GERD (gastroesophageal reflux disease)   . History of DVT (deep vein thrombosis)   . History of pulmonary embolism   . Hyperlipidemia   . Hypertension   . Internal bleeding hemorrhoids 2012   "might bleed once/month; only when I strain"  . Internal prolapsed hemorrhoids   . Leg cramps   . Testicular hypofunction     Patient Active Problem List   Diagnosis Date Noted  . Pulmonary embolism, bilateral ([place])   . Lower leg DVT (deep venous thromboembolism), acute ([place])   . Spinal stenosis of lumbar region at multiple levels 01/20/2015  . Screening, ischemic heart disease 01/03/2015  . Hypertriglyceridemia 08/17/2014  . Obesity 08/17/2014  . Type 2 diabetes mellitus without complication ([place]) 06/19/9484  . Essential hypertension, benign 09/13/2013  . Thrombocytopenia ([place]) 09/13/2013  . Hyperlipidemia   . Testicular hypofunction   . Biceps tendon rupture   . GERD (gastroesophageal reflux disease)   . Colon polyps     Past Surgical History:  Procedure Laterality Date  . ANAL FISSURE REPAIR  06/24/2013  . APPENDECTOMY  2011  .  COLONOSCOPY W/ BIOPSIES AND POLYPECTOMY  2012  . COLONSCOPY  2015  . EXCISIONAL HEMORRHOIDECTOMY    . FISSURECTOMY    . HEMORRHOID SURGERY  06/24/2013  . internal sphincterotomy  06-24-2013  . LUMBAR LAMINECTOMY/DECOMPRESSION MICRODISCECTOMY N/A 01/20/2015   Procedure: MICRO LUMBAR DECOMPRESSION L4-5/L3-4/L2-3;  Surgeon: Johnn Hai, MD;  Location: [facility];  Service: Orthopedics;  Laterality: N/A;  . SHOULDER ARTHROSCOPY  08/22/2012   Procedure: ARTHROSCOPY SHOULDER;  Surgeon: Marin Shutter, MD;  Location: [facility];  Service: Orthopedics;  Laterality: Left;  Left shoulder arthroscopic lavage and debridement  . SHOULDER ARTHROSCOPY  09/02/2012   Procedure: ARTHROSCOPY SHOULDER;  Surgeon: Marin Shutter, MD;  Location: [facility];  Service: Orthopedics;  Laterality: Left;  Left shoulder arthroscopy irrigation and debridement  . SHOULDER ARTHROSCOPY W/ ROTATOR CUFF REPAIR  07/09/2012   left  . SKIN LESION EXCISION     Benign  . WISDOM TOOTH EXTRACTION  YRS AGO       Home Medications    Prior to Admission medications   Medication Sig Start Date End Date Taking? Authorizing Provider  ALLERGIST TRAY 1/2CC 27GX1/2" 27G X 1/2" 0.5 ML KIT USE AS DIRECTED WEEKLY FOR ALLERGY INJECTIONS 04/20/17   [provider]  benzonatate (TESSALON) 100 MG capsule Take 1-2 capsules (100-200 mg total) by mouth every 8 (eight) hours. 08/27/17   Noe Gens, PA-C  Blood Glucose Monitoring Suppl (RELION CONFIRM GLUCOSE MONITOR) W/DEVICE KIT USE AS DIRECTED TO TEST BLOOD GLUCOSE DAILY DX: 250.00 08/13/14   Brunetta Jeans, PA-C  Cholecalciferol (VITAMIN D-3) 5000 UNITS TABS Take 1 tablet by mouth daily.    [provider]  clomiPHENE (CLOMID) 50 MG tablet Take 25 mg by mouth daily. 04/02/17   [provider]  docusate sodium (COLACE) 100 MG capsule Take 200 mg by mouth daily.     [provider]  doxycycline (VIBRAMYCIN) 100 MG capsule Take 1 capsule (100 mg total) by mouth 2 (two) times  daily. One po bid x 7 days 08/27/17   Noe Gens, PA-C  EPIPEN 2-PAK 0.3 MG/0.3ML SOAJ injection Inject 0.3 mg into the skin as needed (allergies).  04/19/15   [provider]  hydrochlorothiazide (HYDRODIURIL) 25 MG tablet TAKE 1 TABLET (25 MG TOTAL) BY MOUTH DAILY. 04/25/17   Wendling, Crosby Oyster, DO  Icosapent Ethyl (VASCEPA) 1 g CAPS TAKE 2 CAPSULES BY MOUTH TWICE A DAY AS DIRECTED 07/16/17   Wendling, Crosby Oyster, DO  [name] TAKE 1 TABLET (100 MG TOTAL) BY MOUTH DAILY BEFORE BREAKFAST. 07/16/17   Wendling, Crosby Oyster, DO  LIVALO 4 MG TABS TAKE 1 TABLET (4 MG TOTAL) BY MOUTH DAILY. 04/16/17   Shelda Pal, DO  magnesium oxide (MAG-OX) 400 MG tablet Take 800 mg by mouth daily.    [provider]  methocarbamol (ROBAXIN) 500 MG tablet Take 1 tablet (500 mg total) by mouth every 8 (eight) hours as needed for muscle spasms. 04/11/16   Brunetta Jeans, PA-C  omeprazole (PRILOSEC) 20 MG capsule TAKE 1 CAPSULE (20 MG TOTAL) BY MOUTH DAILY. 02/19/17   Shelda Pal, DO  [name] Take 1 tablet by mouth daily. Reported on 01/11/2016    [provider]  pyridoxine (B-6) 200 MG tablet Take 200 mg by mouth daily.    [provider]  rivaroxaban (XARELTO) 10 MG TABS tablet Take 1 tablet (10 mg total) by mouth daily. 05/23/17   Volanda Napoleon, MD  zolpidem (AMBIEN) 5 MG tablet Take 1 tablet (5 mg total) by mouth at bedtime as needed for sleep. 06/25/17   Shelda Pal, DO    Family History Family History  Problem Relation Age of Onset  . Heart disease Mother 34       Deceased  . Hyperlipidemia Mother   . Hypertension Mother   . Heart disease Father 47       Deceased  . CVA Father   . Hypertension Maternal Grandmother   . Hyperlipidemia Maternal Grandmother   . Cancer Maternal Grandmother        Colon Cancer  . Heart disease Maternal Grandfather   . Hypertension Maternal Grandfather   .  Hyperlipidemia Maternal Grandfather   . Cancer Maternal Grandfather        Lung Cancer  . Heart attack Maternal Grandfather   . Heart disease Paternal Grandfather     Social History Social History  Substance Use Topics  . Smoking status: Never Smoker  . Smokeless tobacco: Never Used     Comment: NEVER USED TOBACCO  . Alcohol use No     Allergies   Tetanus toxoids; Daptomycin; Asa [aspirin]; Citrus; Milk-related compounds; Oxycodone; Penicillins; and Prednisone (pak) [prednisone]   Review of Systems Review of Systems  Constitutional: Negative for chills and fever.  HENT: Positive for congestion and postnasal drip. Negative for ear  pain, sore throat, trouble swallowing and voice change.   Respiratory: Positive for cough. Negative for shortness of breath.   Cardiovascular: Negative for chest pain and palpitations.  Gastrointestinal: Negative for abdominal pain, diarrhea, nausea and vomiting.  Musculoskeletal: Negative for arthralgias, back pain and myalgias.  Skin: Negative for rash.     Physical Exam Triage Vital Signs ED Triage Vitals  Enc Vitals Group     BP 08/27/17 1439 117/83     Pulse Rate 08/27/17 1439 78     Resp 08/27/17 1439 18     Temp 08/27/17 1439 98.2 F (36.8 C)     Temp Source 08/27/17 1439 Oral     SpO2 08/27/17 1439 98 %     Weight 08/27/17 1440 222 lb 8 oz (100.9 kg)     Height 08/27/17 1440 5' 8"  (1.727 m)     Head Circumference --      Peak Flow --      Pain Score --      Pain Loc --      Pain Edu? --      Excl. in [place]? --    No data found.   Updated Vital Signs BP 117/83 (BP Location: Left Arm)   Pulse 78   Temp 98.2 F (36.8 C) (Oral)   Resp 18   Ht 5' 8"  (1.727 m)   Wt 222 lb 8 oz (100.9 kg)   SpO2 98%   BMI 33.83 kg/m      Physical Exam  Constitutional: He is oriented to person, place, and time. He appears well-developed and well-nourished. No distress.  HENT:  Head: Normocephalic and atraumatic.  Right Ear: Tympanic  membrane normal.  Left Ear: Tympanic membrane normal.  Nose: Nose normal.  Mouth/Throat: Uvula is midline, oropharynx is clear and moist and mucous membranes are normal.  Eyes: EOM are normal.  Neck: Normal range of motion. Neck supple.  Cardiovascular: Normal rate and regular rhythm.   Pulmonary/Chest: Effort normal and breath sounds normal. No stridor. No respiratory distress. He has no wheezes. He has no rales.  Faint diffuse rhonchi  Musculoskeletal: Normal range of motion.  Lymphadenopathy:    He has no cervical adenopathy.  Neurological: He is alert and oriented to person, place, and time.  Skin: Skin is warm and dry. He is not diaphoretic.  Psychiatric: He has a normal mood and affect. His behavior is normal.  Nursing note and vitals reviewed.    UC Treatments / Results  Labs (all labs ordered are listed, but only abnormal results are displayed) Labs Reviewed - No data to display  EKG  EKG Interpretation None       Radiology No results found.  Procedures Procedures (including critical care time)  Medications Ordered in UC Medications - No data to display   Initial Impression / Assessment and Plan / UC Course  I have reviewed the triage vital signs and the nursing notes.  Pertinent labs & imaging results that were available during my care of the patient were reviewed by me and considered in my medical decision making (see chart for details).       Final Clinical Impressions(s) / UC Diagnoses   Final diagnoses:  Upper respiratory tract infection, unspecified type  Persistent cough for 3 weeks or longer   Due to duration of symptoms, will cover for bacterial cause.  Home care instructions provided. F/u with PCP in 7-10 days if not improving.   New Prescriptions Discharge Medication List as of 08/27/2017  2:47 PM    START taking these medications   Details  benzonatate (TESSALON) 100 MG capsule Take 1-2 capsules (100-200 mg total) by mouth every 8  (eight) hours., Starting Mon 08/27/2017, Normal    doxycycline (VIBRAMYCIN) 100 MG capsule Take 1 capsule (100 mg total) by mouth 2 (two) times daily. One po bid x 7 days, Starting Mon 08/27/2017, Normal         Controlled Substance Prescriptions [place] Controlled Substance Registry consulted? Not Applicable   Tyrell Antonio 08/27/17 1524

## 2017-08-27 NOTE — ED Triage Notes (Signed)
Patient presents to [facility] with C/O cough times three weeks. Occasional production with yellow sputums, Denies pain. Or other symptoms, Patient feels he may have a sinus infection.

## 2017-09-12 NOTE — Progress Notes (Addendum)
Patient ID: Ian [name]                 DOB: 10-14-1961                    MRN: [id number]     HPI: MAL ASHER a pleasant 56 y.o.malepatient referred to lipid clinic by Dr. Nahser/Dr Meda Coffee for elevated triglycerides who presents today for follow up. PMH is significant for HLD, elevated TG, DM, HTN, and history of PE following back surgery. Pt was optimized on lipid lowering regimen of Livalo 87m daily, Vascepa 4g daily, and fenofibrate 1445mdaily, but TG remained elevated at 350. He has multiple statin intolerances and was previously switched from fenofibrate capsules to tablets due to GI issues (did tolerate tablets better). At last visit, pt was referred to a TG clinical trial.  Pt reports feeling well overall. He is tolerating his Livalo 59m58mnd Vascepa 4g daily. He stopped his fenofibrate after his last visit because it was an exclusion criteria for the clinical trial. His constipation improved within 1 week of stopping his fenofibrate. However, he states he has not been enrolled in the clinical trial yet.  Pt reports eating well overall. He focuses on eating chicken, [place] fish. He did have cake last week for his birthday. He avoids alcohol but does like bread. Overall he watches his carbs since he is diabetic. His A1c has been well controlled at 6.1%.   Current Medications: Livalo 59mg31mily, Vascepa 4g daily Intolerances: atorvastatin 10 and 20mg12mly, rosuvastatin - muscle aches with both, gemfibrozil 600mg 86m Zetia 10mg d59m, niacin - flushing, Welchol 625mg 2 359m BID, fenofibrate 1359mg cap37ms - constipation TG goal:<150, LDL goal < 100 for primary prevention with risk factors (DM and HTN)  Diet: Eats greens, beef - 3x a week and has since cut out. Doesn't drink alcohol. Diabetic - knows carbohydrate sources well. Doesn't eat sweets. Focusing more on lean chicken. Eats 2 eggs most days a week.  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    Family History: Maternal grandfather with heart disease and MI, mother was a smoker with CAD, dad died from gallbladder complications.  Social History: Denies alcohol use, cigarette use, or illicit drug use.  Labs: 06/25/17: Hgb A1c: 6.1% 05/07/17: TC 130, TG 350, HDL 25, LDL-D 58 (Livalo 59mg daily71mascepa 4g, fenofibrate 145mg daily36m5/18: Hgb A1c 6.1% 12/11/16: TC 121, TG 212, HDL 26, LDL 53 (Livalo 59mg, Vascep30mg, fenofibrate 145mg) 08/28/97mC 163, TG 545, HDL 27.3, LDL- D 35, A1C 6.6% (Livalo 59mg, Vascepa 6m fenofibrate 145mg) 05/23/16:659m168, TG 617, HDL 25.9, LDL-D 26 (Livalo 59mg, Vascepa 4g72menofibrate 1359mg but reports39ming less meat) 04/11/16: TC 289, TG 1702, HDL 21.6, LDL-D 36 (Livalo 59mg, Vascepa 4g, 75mofibrate 1359mg). A1c 7.9%  P36mMedical History:  Diagnosis Date  . Allergy   . Arthritis   . Biceps tendon rupture    bilateral  . Clostridium difficile infection 2014  . Colon polyps   . Diabetes (HCC)    Borderline/[place]-diabetes  . GERD (gastroesophageal reflux disease)   . History of DVT (deep vein thrombosis)   . History of pulmonary embolism   . Hyperlipidemia   . Hypertension   . Internal bleeding hemorrhoids 2012   "might bleed once/month; only when I strain"  . Internal prolapsed hemorrhoids   . Leg cramps   . Testicular hypofunction     Current Outpatient Prescriptions on  File Prior to Visit  Medication Sig Dispense Refill  . ALLERGIST TRAY 1/2CC 27GX1/2" 27G X 1/2" 0.5 ML KIT USE AS DIRECTED WEEKLY FOR ALLERGY INJECTIONS  6  . benzonatate (TESSALON) 100 MG capsule Take 1-2 capsules (100-200 mg total) by mouth every 8 (eight) hours. 21 capsule 0  . Blood Glucose Monitoring Suppl (RELION CONFIRM GLUCOSE MONITOR) W/DEVICE KIT USE AS DIRECTED TO TEST BLOOD GLUCOSE DAILY DX: 250.00 1 kit 0  . Cholecalciferol (VITAMIN D-3) 5000 UNITS TABS Take 1 tablet by mouth daily.    . clomiPHENE (CLOMID) 50 MG tablet Take 25 mg by mouth daily.  11  . docusate  sodium (COLACE) 100 MG capsule Take 200 mg by mouth daily.     Marland Kitchen doxycycline (VIBRAMYCIN) 100 MG capsule Take 1 capsule (100 mg total) by mouth 2 (two) times daily. One po bid x 7 days 14 capsule 0  . EPIPEN 2-PAK 0.3 MG/0.3ML SOAJ injection Inject 0.3 mg into the skin as needed (allergies).     . hydrochlorothiazide (HYDRODIURIL) 25 MG tablet TAKE 1 TABLET (25 MG TOTAL) BY MOUTH DAILY. 90 tablet 1  . Icosapent Ethyl (VASCEPA) 1 g CAPS TAKE 2 CAPSULES BY MOUTH TWICE A DAY AS DIRECTED 120 capsule 3  . [name] TAKE 1 TABLET (100 MG TOTAL) BY MOUTH DAILY BEFORE BREAKFAST. 30 tablet 5  . LIVALO 4 MG TABS TAKE 1 TABLET (4 MG TOTAL) BY MOUTH DAILY. 30 tablet 11  . magnesium oxide (MAG-OX) 400 MG tablet Take 800 mg by mouth daily.    . methocarbamol (ROBAXIN) 500 MG tablet Take 1 tablet (500 mg total) by mouth every 8 (eight) hours as needed for muscle spasms. 90 tablet 1  . omeprazole (PRILOSEC) 20 MG capsule TAKE 1 CAPSULE (20 MG TOTAL) BY MOUTH DAILY. 90 capsule 1  . [name] Take 1 tablet by mouth daily. Reported on 01/11/2016    . pyridoxine (B-6) 200 MG tablet Take 200 mg by mouth daily.    . rivaroxaban (XARELTO) 10 MG TABS tablet Take 1 tablet (10 mg total) by mouth daily. 30 tablet 6  . zolpidem (AMBIEN) 5 MG tablet Take 1 tablet (5 mg total) by mouth at bedtime as needed for sleep. 30 tablet 0   No current facility-administered medications on file prior to visit.     Allergies  Allergen Reactions  . Tetanus Toxoids Anaphylaxis  . Daptomycin Other (See Comments)    myalgias  . Asa [Aspirin] Nausea Only and Rash  . Citrus Diarrhea    All citrus fruits  . Milk-Related Compounds Diarrhea    Milk and milk products  . Oxycodone   . Penicillins Rash  . Prednisone (Pak) [Prednisone] Other (See Comments)    Hyperactivity WITH ORAL, CAN TAKE SHOTS-Cannot do Dose pack    Assessment/Plan:  1. Hypertriglyceridemia -  Reached out to Reinaldo Meeker who coordinates  TG study to see why pt has not been enrolled yet. He has completed his fenofibrate washout in anticipation of enrollment and has continued on his Vascepa 4g and Livalo 58m daily. Will f/u with pt by telephone when I have heard back regarding the clinical trial. If he does not qualify, he is ok with resuming his fenofibrate 1439mdaily. If he does, he will need to remain off of the fenofibrate for the duration of the study.  Pt will continue to focus on lifestyle improvements. He stays active and limits his sugar intake. He does not drink  alcohol and will try to moderate his carbohydrate intake.  Will schedule f/u lipid panel pending enrollment in clinical trial.  Megan E. Supple, PharmD, CPP, [facility] [address] [place], [place] [address] Phone: [phone]; Fax: [phone] 09/13/2017 9:37 AM  Addendum: Pt is going to be rescreened for TG study, he was incorrectly screen-failed.

## 2017-09-13 ENCOUNTER — Encounter: Payer: Self-pay | Admitting: Cardiology

## 2017-09-13 ENCOUNTER — Ambulatory Visit (INDEPENDENT_AMBULATORY_CARE_PROVIDER_SITE_OTHER): Payer: BLUE CROSS/BLUE SHIELD | Admitting: Cardiology

## 2017-09-13 ENCOUNTER — Ambulatory Visit (INDEPENDENT_AMBULATORY_CARE_PROVIDER_SITE_OTHER): Payer: BLUE CROSS/BLUE SHIELD | Admitting: Pharmacist

## 2017-09-13 VITALS — BP 124/82 | HR 67 | Ht 68.0 in | Wt 220.0 lb

## 2017-09-13 DIAGNOSIS — E785 Hyperlipidemia, unspecified: Secondary | ICD-10-CM | POA: Diagnosis not present

## 2017-09-13 DIAGNOSIS — E781 Pure hyperglyceridemia: Secondary | ICD-10-CM

## 2017-09-13 DIAGNOSIS — I739 Peripheral vascular disease, unspecified: Secondary | ICD-10-CM | POA: Diagnosis not present

## 2017-09-13 DIAGNOSIS — I2699 Other pulmonary embolism without acute cor pulmonale: Secondary | ICD-10-CM

## 2017-09-13 DIAGNOSIS — Z789 Other specified health status: Secondary | ICD-10-CM

## 2017-09-13 DIAGNOSIS — I2510 Atherosclerotic heart disease of native coronary artery without angina pectoris: Secondary | ICD-10-CM | POA: Diagnosis not present

## 2017-09-13 DIAGNOSIS — E782 Mixed hyperlipidemia: Secondary | ICD-10-CM

## 2017-09-13 NOTE — Patient Instructions (Signed)
Medication Instructions:   Your physician recommends that you continue on your current medications as directed. Please refer to the Current Medication list given to you today.    Testing/Procedures:  Your physician has requested that you have a lower extremity arterial duplex. This test is an ultrasound of the arteries in the legs or arms. It looks at arterial blood flow in the legs and arms. Allow one hour for Lower and Upper Arterial scans. There are no restrictions or special instructions    Follow-Up:  Your physician wants you to follow-up in: [place] will receive a reminder letter in the mail two months in advance. If you don't receive a letter, please call our office to schedule the follow-up appointment.       If you need a refill on your cardiac medications before your next appointment, please call your pharmacy.

## 2017-09-13 NOTE — Progress Notes (Signed)
Patient Name: Ian [name] Date of Encounter: 09/13/2017  Primary Care Provider:  Shelda Pal, DO Primary Cardiologist:  Dr Acie Fredrickson -> Dr Meda Coffee  Problem List   Past Medical History:  Diagnosis Date  . Allergy   . Arthritis   . Biceps tendon rupture    bilateral  . Clostridium difficile infection 2014  . Colon polyps   . Diabetes ([place])    Borderline/Pre-diabetes  . GERD (gastroesophageal reflux disease)   . History of DVT (deep vein thrombosis)   . History of pulmonary embolism   . Hyperlipidemia   . Hypertension   . Internal bleeding hemorrhoids 2012   "might bleed once/month; only when I strain"  . Internal prolapsed hemorrhoids   . Leg cramps   . Testicular hypofunction    Past Surgical History:  Procedure Laterality Date  . ANAL FISSURE REPAIR  06/24/2013  . APPENDECTOMY  2011  . COLONOSCOPY W/ BIOPSIES AND POLYPECTOMY  2012  . COLONSCOPY  2015  . EXCISIONAL HEMORRHOIDECTOMY    . FISSURECTOMY    . HEMORRHOID SURGERY  06/24/2013  . internal sphincterotomy  06-24-2013  . LUMBAR LAMINECTOMY/DECOMPRESSION MICRODISCECTOMY N/A 01/20/2015   Procedure: MICRO LUMBAR DECOMPRESSION L4-5/L3-4/L2-3;  Surgeon: Johnn Hai, MD;  Location: [facility];  Service: Orthopedics;  Laterality: N/A;  . SHOULDER ARTHROSCOPY  08/22/2012   Procedure: ARTHROSCOPY SHOULDER;  Surgeon: Marin Shutter, MD;  Location: [facility];  Service: Orthopedics;  Laterality: Left;  Left shoulder arthroscopic lavage and debridement  . SHOULDER ARTHROSCOPY  09/02/2012   Procedure: ARTHROSCOPY SHOULDER;  Surgeon: Marin Shutter, MD;  Location: [facility];  Service: Orthopedics;  Laterality: Left;  Left shoulder arthroscopy irrigation and debridement  . SHOULDER ARTHROSCOPY W/ ROTATOR CUFF REPAIR  07/09/2012   left  . SKIN LESION EXCISION     Benign  . WISDOM TOOTH EXTRACTION  YRS AGO    Allergies  Allergies  Allergen Reactions  . Tetanus Toxoids Anaphylaxis  . Daptomycin Other (See Comments)   myalgias  . Asa [Aspirin] Nausea Only and Rash  . Citrus Diarrhea    All citrus fruits  . Milk-Related Compounds Diarrhea    Milk and milk products  . Oxycodone   . Penicillins Rash  . Prednisone (Pak) [Prednisone] Other (See Comments)    Hyperactivity WITH ORAL, CAN TAKE SHOTS-Cannot do Dose pack    HPI 56 y.o.malepatient previously followed by Dr Acie Fredrickson and our lipid clinic, PMH is significant for HLD, elevated TG, DM, HTN, and history of PE following back surgery. At last visit, pt was switched from fenofibrate capsules to tablets to see if this would help with pt's constipation which was affecting adherence. Pt reports tolerating tablets better.  TG have been as high as 1700 previously, dropped to ~600 6 weeks later. Pt reported he ate less beef during this time and that is the only thing he did differently. He is intolerant to atorvastatin and rosuvastatin, currently tolerating pitavastatin. Tolerating fenofibrate tablets better than capsules, however he is still having some constipation. He will use natural laxatives which helps. He stopped his fenofibrate for 1 week and his symptoms resolved, however he did ultimately restart his fenofibrate for his TG. He does not drink alcohol and avoids sugary foods. Watches his carbs since he is diabetic. Most recent A1c from 3 months ago is 6.1%, pt reports fasting CBGs in the 90s.   Pt has lost close to 50 lbs and is focusing on his dietary improvements. He is eating  a lot of lean chicken and is walking 3 miles a day. He did have 2 illnesses recently and admits to dietary indiscretion during this time. States he has had more comfort food and beef than normal.  09/13/17 - the patient transitioned to me, he has been active, works outside and denies any chest pain or SOB, no dizziness. He has been experiencing LLE claudications After walking moderate distances. He doesn't have any pain at rest. He has been compliant with his medications, he was  supposed to be enrolled in a research study and was asked to discontinue fenofibrate in May. It has been 3 months, for him personally it has been great because he is experiencing constipation with fenofibrate however the study group didn't follow with him and he has been enrolled yet.  Current Medications: Livalo 19m daily, fenofibrate 1459mtablet daily, Vascepa 4g daily Intolerances: atorvastatin 10 and 2032maily, rosuvastatin - muscle aches with both, gemfibrozil 600m65mD, Zetia 10mg19mly, niacin - flushing, Welchol 625mg 10mbs BID, fenofibrate 134mg c73mles - constipation TG goal: <150, LDL goal < 100 for primary prevention with risk factors (DM and HTN)  Diet: Eats greens, beef - 3x a week and has since cut out. Doesn't drink alcohol. Diabetic - knows carbohydrate sources well. Doesn't eat sweets. Focusing more on lean chicken. Eats 2 eggs most days a week.  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   Family History: Maternal grandfather with heart disease and MI, mother was a smoker with CAD, dad died from gallbladder complications.  Social History: Denies alcohol use, cigarette use, or illicit drug use.  Home Medications  Prior to Admission medications   Medication Sig Start Date End Date Taking? Authorizing Provider  ALLERGIST TRAY 1/2CC 27GX1/2" 27G X 1/2" 0.5 ML KIT USE AS DIRECTED WEEKLY FOR ALLERGY INJECTIONS 04/20/17   [provider]  benzonatate (TESSALON) 100 MG capsule Take 1-2 capsules (100-200 mg total) by mouth every 8 (eight) hours. 08/27/17   Phelps,Noe Gens Blood Glucose Monitoring Suppl (RELION CONFIRM GLUCOSE MONITOR) W/DEVICE KIT USE AS DIRECTED TO TEST BLOOD GLUCOSE DAILY DX: 250.00 08/13/14   Martin,Brunetta Jeans Cholecalciferol (VITAMIN D-3) 5000 UNITS TABS Take 1 tablet by mouth daily.    [provider]  clomiPHENE (CLOMID) 50 MG tablet Take 25 mg by mouth daily. 04/02/17   [provider]  docusate  sodium (COLACE) 100 MG capsule Take 200 mg by mouth daily.     [provider]  doxycycline (VIBRAMYCIN) 100 MG capsule Take 1 capsule (100 mg total) by mouth 2 (two) times daily. One po bid x 7 days 08/27/17   Phelps,Noe Gens EPIPEN 2-PAK 0.3 MG/0.3ML SOAJ injection Inject 0.3 mg into the skin as needed (allergies).  04/19/15   [provider]  hydrochlorothiazide (HYDRODIURIL) 25 MG tablet TAKE 1 TABLET (25 MG TOTAL) BY MOUTH DAILY. 04/25/17   Wendling, NicholaCrosby Oystercosapent Ethyl (VASCEPA) 1 g CAPS TAKE 2 CAPSULES BY MOUTH TWICE A DAY AS DIRECTED 07/16/17   Wendling, NicholaCrosby OysterNVOKANA 100 MG TABS tablet TAKE 1 TABLET (100 MG TOTAL) BY MOUTH DAILY BEFORE BREAKFAST. 07/16/17   Wendling, NicholaCrosby OysterIVALO 4 MG TABS TAKE 1 TABLET (4 MG TOTAL) BY MOUTH DAILY. 04/16/17   WendlinShelda Palagnesium oxide (MAG-OX) 400 MG tablet Take 800 mg by mouth daily.    [provider]  methocarbamol (  ROBAXIN) 500 MG tablet Take 1 tablet (500 mg total) by mouth every 8 (eight) hours as needed for muscle spasms. 04/11/16   Brunetta Jeans, PA-C  omeprazole (PRILOSEC) 20 MG capsule TAKE 1 CAPSULE (20 MG TOTAL) BY MOUTH DAILY. 02/19/17   Shelda Pal, DO  [name] Take 1 tablet by mouth daily. Reported on 01/11/2016    [provider]  pyridoxine (B-6) 200 MG tablet Take 200 mg by mouth daily.    [provider]  rivaroxaban (XARELTO) 10 MG TABS tablet Take 1 tablet (10 mg total) by mouth daily. 05/23/17   Volanda Napoleon, MD  zolpidem (AMBIEN) 5 MG tablet Take 1 tablet (5 mg total) by mouth at bedtime as needed for sleep. 06/25/17   Shelda Pal, DO    Family History  Family History  Problem Relation Age of Onset  . Heart disease Mother 85       Deceased  . Hyperlipidemia Mother   . Hypertension Mother   . Heart disease Father 49       Deceased  . CVA Father   . Hypertension Maternal Grandmother     . Hyperlipidemia Maternal Grandmother   . Cancer Maternal Grandmother        Colon Cancer  . Heart disease Maternal Grandfather   . Hypertension Maternal Grandfather   . Hyperlipidemia Maternal Grandfather   . Cancer Maternal Grandfather        Lung Cancer  . Heart attack Maternal Grandfather   . Heart disease Paternal Grandfather     Social History  Social History   Social History  . Marital status: Married    Spouse name: N/A  . Number of children: N/A  . Years of education: N/A   Occupational History  . Not on file.   Social History Main Topics  . Smoking status: Never Smoker  . Smokeless tobacco: Never Used     Comment: NEVER USED TOBACCO  . Alcohol use No  . Drug use: No  . Sexual activity: Not Currently   Other Topics Concern  . Not on file   Social History Narrative   Marital Status: Married Pamala Hurry)    Children:  Step Daughter Sharyn Lull)    Pets: None    Living Situation: Lives with Pamala Hurry    Occupation: [employer] ([place])    Education: Associate's Degree in [employer]    Tobacco Use/Exposure:  None    Alcohol Use:  Rarely    Drug Use:  None   Diet:  Regular   Exercise: Limited     Hobbies:  Hunting, Fishing               Review of Systems, as per HPI, otherwise negative General:  No chills, fever, night sweats or weight changes.  Cardiovascular:  No chest pain, dyspnea on exertion, edema, orthopnea, palpitations, paroxysmal nocturnal dyspnea. Dermatological: No rash, lesions/masses Respiratory: No cough, dyspnea Urologic: No hematuria, dysuria Abdominal:   No nausea, vomiting, diarrhea, bright red blood per rectum, melena, or hematemesis Neurologic:  No visual changes, wkns, changes in mental status. All other systems reviewed and are otherwise negative except as noted above.  Physical Exam  Blood pressure 124/82, pulse 67, height 5' 8" (1.727 m), weight 220 lb (99.8 kg).  General: Pleasant, NAD Psych: Normal  affect. Neuro: Alert and oriented X 3. Moves all extremities spontaneously. HEENT: Normal  Neck: Supple without bruits or JVD. Lungs:  Resp regular and unlabored,  CTA. Heart: RRR no s3, s4, or murmurs. Abdomen: Soft, non-tender, non-distended, BS + x 4.  Extremities: No clubbing, cyanosis or edema. DP/PT/Radials 2+ and equal bilaterally.  Labs:  No results for input(s): CKTOTAL, CKMB, TROPONINI in the last 72 hours. Lab Results  Component Value Date   WBC 6.3 04/30/2017   HGB 17.1 04/30/2017   HCT 48.2 04/30/2017   MCV 91 04/30/2017   PLT 114 (L) 04/30/2017    Lab Results  Component Value Date   DDIMER 0.51 (H) 06/22/2015   Invalid input(s): POCBNP    Component Value Date/Time   NA 144 04/30/2017 1001   K 3.9 04/30/2017 1001   CL 99 08/21/2016 1400   CL 101 08/23/2015 0758   CO2 30 (H) 04/30/2017 1001   GLUCOSE 94 04/30/2017 1001   GLUCOSE 150 (H) 08/23/2015 0758   BUN 14.8 04/30/2017 1001   CREATININE 1.1 04/30/2017 1001   CALCIUM 9.6 04/30/2017 1001   PROT 6.8 04/30/2017 1001   ALBUMIN 3.9 04/30/2017 1001   AST 26 04/30/2017 1001   ALT 28 04/30/2017 1001   ALKPHOS 50 04/30/2017 1001   BILITOT 1.06 04/30/2017 1001   GFRNONAA >60 05/09/2015 0400   GFRNONAA 68 07/29/2014 1546   GFRAA >60 05/09/2015 0400   GFRAA 79 07/29/2014 1546   Lab Results  Component Value Date   CHOL 130 05/07/2017   HDL 25 (L) 05/07/2017   LDLCALC 35 05/07/2017   TRIG 350 (H) 05/07/2017    Accessory Clinical Findings  Echocardiogram - 05/08/2015  - Left ventricle: Technically limited study. Contrast is used.   Overal LV functions is good. Regional wall motion abnormalities   cannot be excluded. - Left atrium: The atrium was mildly dilated. - Right ventricle: The RV is not seen well. I do not see any   definite significant dilitation. TAPSE valvue suggests that RV   function is OK.  ECG -Performed today 09/13/2017 shows normal sinus rhythm with sinus arrhythmia left axis  deviation otherwise normal and unchanged from prior.    Assessment & Plan  1.   Hypertriglyceridemia:   Trigs are very elevate.  Has tried several statins - only tolerates Livalo, off fenofibrate since May as instructed by Research officer, political party, research group contacted again to enroll the patient. He is also on Vascepa that he tolerates very well. He would ideally prefer not to begin fenofibrate as these is causing significant constipation and requires use of laxatives every other day. His diet has been great and he exercises a lot. I have reviewed his chest CT performed in August 2016 and are clear calcifications in all 3 coronary arteries however he is asymptomatic, no ischemic workup is needed at this time.  2.   Essential HTN:   BP is stable   3. Hx of pulmonary embolus :  Continue xarelto 10 mg a day   4. Claudications - in the left lower extremity, I will order bilateral lower extremity arterial ultrasound.  Ena Dawley, MD, [facility] 09/13/2017, 8:36 AM

## 2017-09-20 ENCOUNTER — Other Ambulatory Visit: Payer: Self-pay | Admitting: Cardiology

## 2017-09-20 DIAGNOSIS — I739 Peripheral vascular disease, unspecified: Secondary | ICD-10-CM

## 2017-10-01 ENCOUNTER — Ambulatory Visit (HOSPITAL_COMMUNITY)
Admission: RE | Admit: 2017-10-01 | Discharge: 2017-10-01 | Disposition: A | Discharge status: Home / Self Care | Payer: BLUE CROSS/BLUE SHIELD | Source: Ambulatory Visit | Attending: Cardiology | Admitting: Cardiology

## 2017-10-01 DIAGNOSIS — I739 Peripheral vascular disease, unspecified: Secondary | ICD-10-CM | POA: Diagnosis present

## 2017-10-19 ENCOUNTER — Other Ambulatory Visit: Payer: Self-pay | Admitting: Family Medicine

## 2017-10-29 ENCOUNTER — Ambulatory Visit (HOSPITAL_BASED_OUTPATIENT_CLINIC_OR_DEPARTMENT_OTHER): Payer: BLUE CROSS/BLUE SHIELD | Admitting: Hematology & Oncology

## 2017-10-29 ENCOUNTER — Other Ambulatory Visit (HOSPITAL_BASED_OUTPATIENT_CLINIC_OR_DEPARTMENT_OTHER): Payer: BLUE CROSS/BLUE SHIELD

## 2017-10-29 VITALS — BP 140/81 | HR 72 | Temp 98.4°F | Resp 20 | Wt 229.0 lb

## 2017-10-29 DIAGNOSIS — I82532 Chronic embolism and thrombosis of left popliteal vein: Secondary | ICD-10-CM | POA: Diagnosis not present

## 2017-10-29 DIAGNOSIS — I82512 Chronic embolism and thrombosis of left femoral vein: Secondary | ICD-10-CM

## 2017-10-29 DIAGNOSIS — Z7901 Long term (current) use of anticoagulants: Secondary | ICD-10-CM

## 2017-10-29 DIAGNOSIS — I2699 Other pulmonary embolism without acute cor pulmonale: Secondary | ICD-10-CM

## 2017-10-29 DIAGNOSIS — I824Z2 Acute embolism and thrombosis of unspecified deep veins of left distal lower extremity: Secondary | ICD-10-CM

## 2017-10-29 DIAGNOSIS — Z86711 Personal history of pulmonary embolism: Secondary | ICD-10-CM

## 2017-10-29 LAB — CMP (CANCER CENTER ONLY)
ALK PHOS: 39 U/L (ref 26–84)
ALT: 29 U/L (ref 10–47)
AST: 32 U/L (ref 11–38)
Albumin: 3.8 g/dL (ref 3.3–5.5)
BILIRUBIN TOTAL: 1.2 mg/dL (ref 0.20–1.60)
BUN: 16 mg/dL (ref 7–22)
CALCIUM: 9.5 mg/dL (ref 8.0–10.3)
CO2: 31 mEq/L (ref 18–33)
Chloride: 102 mEq/L (ref 98–108)
Creat: 1.3 mg/dl — ABNORMAL HIGH (ref 0.6–1.2)
Glucose, Bld: 97 mg/dL (ref 73–118)
POTASSIUM: 3.8 meq/L (ref 3.3–4.7)
Sodium: 142 mEq/L (ref 128–145)
TOTAL PROTEIN: 6.6 g/dL (ref 6.4–8.1)

## 2017-10-29 LAB — CBC WITH DIFFERENTIAL (CANCER CENTER ONLY)
BASO#: 0 10*3/uL (ref 0.0–0.2)
BASO%: 0.6 % (ref 0.0–2.0)
EOS%: 3 % (ref 0.0–7.0)
Eosinophils Absolute: 0.2 10*3/uL (ref 0.0–0.5)
HEMATOCRIT: 46.9 % (ref 38.7–49.9)
HGB: 16.7 g/dL (ref 13.0–17.1)
LYMPH#: 2 10*3/uL (ref 0.9–3.3)
LYMPH%: 30.8 % (ref 14.0–48.0)
MCH: 32.6 pg (ref 28.0–33.4)
MCHC: 35.6 g/dL (ref 32.0–35.9)
MCV: 91 fL (ref 82–98)
MONO#: 0.7 10*3/uL (ref 0.1–0.9)
MONO%: 11.7 % (ref 0.0–13.0)
NEUT%: 53.9 % (ref 40.0–80.0)
NEUTROS ABS: 3.4 10*3/uL (ref 1.5–6.5)
Platelets: 125 10*3/uL — ABNORMAL LOW (ref 145–400)
RBC: 5.13 10*6/uL (ref 4.20–5.70)
RDW: 14.2 % (ref 11.1–15.7)
WBC: 6.3 10*3/uL (ref 4.0–10.0)

## 2017-10-29 NOTE — Progress Notes (Signed)
Hematology and Oncology Follow Up Visit  Ian [name] [id number] 07/16/1961 56 y.o. 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    Interim History:  Ian [name] is here today for a follow-up. He is doing okay. He has had no problems with the Xarelto.  He is doing well.  He has had no real problems since we last saw him.  He has had no bleeding.  He is still working.  He does yard mowing.  He does quite a few yards.  He has been a tough summer for him.  He has had no issues with leg swelling.  He has had no leg pain.  There is been no chest wall pain.  He has had no nausea or vomiting.  He has had no change in bowel or bladder habits.  He has had no fever.  It sounds like he is going need surgery for his right shoulder.  I told him to make sure he lets me know when he will have this so we can coordinate with orthopedic surgery about the Xarelto dosing.  Overall, his performance status is ECOG 1.  Medications:  Allergies as of 10/29/2017      Reactions   Tetanus Toxoids Anaphylaxis   Daptomycin Other (See Comments)   myalgias   Asa [aspirin] Nausea Only, Rash   Citrus Diarrhea   All citrus fruits   Milk-related Compounds Diarrhea   Milk and milk products   Oxycodone    Penicillins Rash   Prednisone (pak) [prednisone] Other (See Comments)   Hyperactivity WITH ORAL, CAN TAKE SHOTS-Cannot do Dose pack      Medication List        Accurate as of 10/29/17 11:52 AM. Always use your most recent med list.          ALLERGIST TRAY 1/2CC 27GX1/2" 27G X 1/2" 0.5 ML Kit Generic drug:  Tuberculin-Allergy Syringes USE AS DIRECTED WEEKLY FOR ALLERGY INJECTIONS   clomiPHENE 50 MG tablet Commonly known as:  CLOMID Take 25 mg by mouth daily.   [name] Take 200 mg by mouth  daily.   [name] Generic drug:  EPINEPHrine Inject 0.3 mg into the skin as needed (allergies).   [name] TAKE 1 TABLET BY MOUTH EVERY DAY   Icosapent Ethyl 1 g Caps Commonly known as:  VASCEPA TAKE 2 CAPSULES BY MOUTH TWICE A DAY AS DIRECTED   [name] Generic drug:  canagliflozin TAKE 1 TABLET (100 MG TOTAL) BY MOUTH DAILY BEFORE BREAKFAST.   [name] TAKE 1 TABLET (4 MG TOTAL) BY MOUTH DAILY.   magnesium oxide 400 MG tablet Commonly known as:  MAG-OX Take 800 mg by mouth daily.   [name] TAKE 1 CAPSULE (20 MG TOTAL) BY MOUTH DAILY.   [name] Take 1 tablet by mouth daily. Reported on 01/11/2016   pyridoxine 200 MG tablet Commonly known as:  B-6 Take 200 mg by mouth daily.   [name] USE AS DIRECTED TO TEST BLOOD GLUCOSE DAILY DX: 250.00   [name] Take  1 tablet (10 mg total) by mouth daily.   [name] Take 1 tablet by mouth daily.   [name] Take 1 tablet (5 mg total) by mouth at bedtime as needed for sleep.       Allergies:  Allergies  Allergen Reactions  . Tetanus Toxoids Anaphylaxis  . Daptomycin Other (See Comments)    myalgias  . Asa [Aspirin] Nausea Only and Rash  . Citrus Diarrhea    All citrus fruits  . Milk-Related Compounds Diarrhea    Milk and milk products  . Oxycodone   . Penicillins Rash  . Prednisone (Pak) [Prednisone] Other (See Comments)    Hyperactivity WITH ORAL, CAN TAKE SHOTS-Cannot do Dose pack    Past Medical History, Surgical history, Social history, and Family History were reviewed and updated.  Review of Systems: As stated in the interim history  Physical Exam:   weight is 229 lb (103.9 kg). His oral temperature is 98.4 F (36.9 C). His blood pressure is 140/81 and his pulse is 72. His respiration is 20 and oxygen saturation is 97%.   Wt Readings from Last 3 Encounters:  10/29/17 229 lb (103.9 kg)  09/13/17 220 lb (99.8 kg)  08/27/17 222 lb 8 oz (100.9 kg)    Physical Exam  Constitutional: He is oriented to person, place, and time.  HENT:  Head: Normocephalic and atraumatic.  Mouth/Throat: Oropharynx is clear and moist.  Eyes: EOM are normal. Pupils are equal, round, and reactive to light.  Neck: Normal range of motion.  Cardiovascular: Normal rate, regular rhythm and normal heart sounds.  Pulmonary/Chest: Effort normal and breath sounds normal.  Abdominal: Soft. Bowel sounds are normal.  Musculoskeletal: Normal range of motion. He exhibits no edema, tenderness or deformity.  Lymphadenopathy:    He has no cervical adenopathy.  Neurological: He is alert and oriented to person, place, and time.  Skin: Skin is warm and dry. No rash noted. No erythema.  Psychiatric: He has a normal mood and affect. His behavior is normal. Judgment and thought content normal.  Vitals reviewed.    Lab Results  Component Value Date   WBC 6.3 10/29/2017   HGB 16.7 10/29/2017   HCT 46.9 10/29/2017   MCV 91 10/29/2017   PLT 125 (L) 10/29/2017   No results found for: FERRITIN, IRON, TIBC, UIBC, IRONPCTSAT Lab Results  Component Value Date   RBC 5.13 10/29/2017   No results found for: KPAFRELGTCHN, LAMBDASER, KAPLAMBRATIO No results found for: IGGSERUM, IGA, IGMSERUM No results found for: Odetta Pink, SPEI   Chemistry      Component Value Date/Time   NA 142 10/29/2017 1027   NA 144 04/30/2017 1001   K 3.8 10/29/2017 1027   K 3.9 04/30/2017 1001   CL 102 10/29/2017 1027   CO2 31 10/29/2017 1027   CO2 30 (H) 04/30/2017 1001   BUN 16 10/29/2017 1027   BUN 14.8 04/30/2017 1001   CREATININE 1.3 (H)  10/29/2017 1027   CREATININE 1.1 04/30/2017 1001      Component Value Date/Time   CALCIUM 9.5 10/29/2017 1027   CALCIUM 9.6 04/30/2017 1001   ALKPHOS 39 10/29/2017 1027   ALKPHOS 50 04/30/2017 1001   AST 32 10/29/2017 1027   AST 26 04/30/2017 1001   ALT 29 10/29/2017 1027   ALT 28 04/30/2017 1001   BILITOT 1.20 10/29/2017 1027   BILITOT 1.06 04/30/2017 1001  Impression and Plan: Mr. Ambrocio is a 56 yo white male with thrombus in the left leg and bilateral PE's. PE's had resolved on CT angio in August 2016. 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 He developed his thrombus after having surgery and being immobile for an extended period of time.   He is doing well with the Xarelto.  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  Again, he will let me know about his shoulder surgery.  Typically, I told him that the orthopedic surgeons will send [place] a form for [place] to fill out to tell them how to manage the anticoagulation.  I think we can get him back in 6 months.  He can always come back sooner if there are any problems.    Volanda Napoleon, MD 11/5/201811:52 AM

## 2017-10-30 LAB — D-DIMER, QUANTITATIVE: D-DIMER: 0.3 mg/L FEU (ref 0.00–0.49)

## 2017-11-08 ENCOUNTER — Other Ambulatory Visit: Payer: Self-pay | Admitting: Family Medicine

## 2017-11-13 ENCOUNTER — Telehealth: Payer: Self-pay | Admitting: Family Medicine

## 2017-11-13 NOTE — Telephone Encounter (Signed)
Copied from [place] [id number]. Topic: Quick Communication - See Telephone Encounter >> Nov 13, 2017 12:47 PM Rosalin Hawking wrote: CRM for notification. See Telephone encounter for:  11/13/17.   Pt dropped of document to be filled out by Provider ( 1 page clearance form for Surgery  [facility]) Pt would like document to be faxed to [facility] at [phone] and to call pt when document faxed. Document put at front office tray.

## 2017-11-14 NOTE — Telephone Encounter (Signed)
Received Medical/Surgical Clearance Form from [place], attached last OV note of PCP & last EKG [Cardiology]; forwarded to provider/SLS 11/21

## 2017-11-19 ENCOUNTER — Telehealth: Payer: Self-pay | Admitting: Hematology & Oncology

## 2017-11-19 NOTE — Telephone Encounter (Signed)
I assume the surgery will be scheduled pending my approval. I need to see him for an appt and there are typically labs I order prior to surgery that need to be done within 30 days of the procedure. TY.

## 2017-11-19 NOTE — Telephone Encounter (Signed)
Patient called checking on the status of forms mentioned below. Informed patient regarding PCP recommendation. Patient scheduled for 30 minute appointment for Thursday 11/22/17 at 11am with Dr. Nani Ravens.

## 2017-11-19 NOTE — Telephone Encounter (Signed)
Ian [name] Self [phone]  Thurmon called to check on forms he dropped off on Tuesday 11/13/2017 for surgery clearence. He stated he wanted to get this as soon as possible, he is planning on having surgery in December since he has already met deductible for the year, needs this form faxed so he may set date. Please call when ready.

## 2017-11-22 ENCOUNTER — Encounter: Payer: Self-pay | Admitting: Family Medicine

## 2017-11-22 ENCOUNTER — Ambulatory Visit (INDEPENDENT_AMBULATORY_CARE_PROVIDER_SITE_OTHER): Payer: BLUE CROSS/BLUE SHIELD | Admitting: Family Medicine

## 2017-11-22 VITALS — BP 118/78 | HR 71 | Temp 98.1°F | Ht 68.0 in | Wt 224.2 lb

## 2017-11-22 DIAGNOSIS — E119 Type 2 diabetes mellitus without complications: Secondary | ICD-10-CM | POA: Diagnosis not present

## 2017-11-22 DIAGNOSIS — Z01818 Encounter for other preprocedural examination: Secondary | ICD-10-CM | POA: Diagnosis not present

## 2017-11-22 LAB — CBC
HCT: 51.4 % (ref 39.0–52.0)
Hemoglobin: 17.5 g/dL — ABNORMAL HIGH (ref 13.0–17.0)
MCHC: 34.1 g/dL (ref 30.0–36.0)
MCV: 95.4 fl (ref 78.0–100.0)
PLATELETS: 122 10*3/uL — AB (ref 150.0–400.0)
RBC: 5.39 Mil/uL (ref 4.22–5.81)
RDW: 13.7 % (ref 11.5–15.5)
WBC: 5.7 10*3/uL (ref 4.0–10.5)

## 2017-11-22 LAB — BASIC METABOLIC PANEL
BUN: 21 mg/dL (ref 6–23)
CALCIUM: 9.7 mg/dL (ref 8.4–10.5)
CO2: 29 meq/L (ref 19–32)
CREATININE: 1.13 mg/dL (ref 0.40–1.50)
Chloride: 102 mEq/L (ref 96–112)
GFR: 70.79 mL/min (ref >60.00)
GLUCOSE: 101 mg/dL — AB (ref 70–99)
Potassium: 4.1 mEq/L (ref 3.5–5.1)
Sodium: 138 mEq/L (ref 135–145)

## 2017-11-22 LAB — MICROALBUMIN / CREATININE URINE RATIO
Creatinine,U: 106.6 mg/dL
Microalb Creat Ratio: 0.7 mg/g (ref 0.0–30.0)
Microalb, Ur: <0.7 mg/dL (ref 0.0–1.9)

## 2017-11-22 LAB — HEMOGLOBIN A1C: HEMOGLOBIN A1C: 6.3 % (ref 4.6–6.5)

## 2017-11-22 MED ORDER — OMEPRAZOLE 20 MG PO CPDR: 20.0000 mg | DELAYED_RELEASE_CAPSULE | Freq: Every day | ORAL | 3 refills | Status: DC

## 2017-11-22 NOTE — Progress Notes (Signed)
Subjective:   Chief Complaint  Patient presents with  . Medical Clearance    Ian [name]  is here for a Pre-operative physical at the request of Dr. Susa Day.   He  is having rotator cuff repair surgery on TBD for rotator cuff pathosis.  The patient denies any personal or family history of issues with anesthesia. He denies any cracked, chipped, loose, or missing teeth. He is good range of motion his neck. He is able to walk up 2 flights of stairs without becoming significant short of breath. He denies exertional chest pain or shortness of breath.   Revised Goldman Criteria: High Risk Surgery (intraperitoneal, intrathoracic, aortic): No    Ischemic heart disease (Prior MI, +excercise stress test, angina, nitrate use, Qwave): No  History of heart failure: No  History of cerebrovascular disease: No  History of diabetes: Yes  Insulin therapy for DM: No  Preoperative Cr >2.0: No   Patient Active Problem List   Diagnosis Date Noted  . Claudication ([place]) 09/13/2017  . Allergy to pollen 11/06/2016  . Pulmonary embolism, bilateral ([place])   . Lower leg DVT (deep venous thromboembolism), acute ([place])   . Spinal stenosis of lumbar region at multiple levels 01/20/2015  . Screening, ischemic heart disease 01/03/2015  . Hypertriglyceridemia 08/17/2014  . Obesity 08/17/2014  . Type 2 diabetes mellitus without complication ([place]) 41/28/7867  . Hx of adenomatous colonic polyps 06/03/2014  . Essential hypertension, benign 09/13/2013  . Thrombocytopenia ([place]) 09/13/2013  . HTN (hypertension) 05/13/2013  . Hyperlipidemia   . Testicular hypofunction   . Biceps tendon rupture   . GERD (gastroesophageal reflux disease)   . Colon polyps    Past Medical History:  Diagnosis Date  . Allergy   . Arthritis   . Biceps tendon rupture    bilateral  . Clostridium difficile infection 2014  . Colon polyps   . Diabetes ([place])    Borderline/Pre-diabetes  . GERD (gastroesophageal reflux disease)   .  History of DVT (deep vein thrombosis)   . History of pulmonary embolism   . Hyperlipidemia   . Hypertension   . Internal bleeding hemorrhoids 2012   "might bleed once/month; only when I strain"  . Internal prolapsed hemorrhoids   . Leg cramps   . Testicular hypofunction     Past Surgical History:  Procedure Laterality Date  . ANAL FISSURE REPAIR  06/24/2013  . APPENDECTOMY  2011  . COLONOSCOPY W/ BIOPSIES AND POLYPECTOMY  2012  . COLONSCOPY  2015  . EXCISIONAL HEMORRHOIDECTOMY    . FISSURECTOMY    . HEMORRHOID SURGERY  06/24/2013  . internal sphincterotomy  06-24-2013  . LUMBAR LAMINECTOMY/DECOMPRESSION MICRODISCECTOMY N/A 01/20/2015   Procedure: MICRO LUMBAR DECOMPRESSION L4-5/L3-4/L2-3;  Surgeon: Johnn Hai, MD;  Location: [facility];  Service: Orthopedics;  Laterality: N/A;  . SHOULDER ARTHROSCOPY  08/22/2012   Procedure: ARTHROSCOPY SHOULDER;  Surgeon: Marin Shutter, MD;  Location: [facility];  Service: Orthopedics;  Laterality: Left;  Left shoulder arthroscopic lavage and debridement  . SHOULDER ARTHROSCOPY  09/02/2012   Procedure: ARTHROSCOPY SHOULDER;  Surgeon: Marin Shutter, MD;  Location: [facility];  Service: Orthopedics;  Laterality: Left;  Left shoulder arthroscopy irrigation and debridement  . SHOULDER ARTHROSCOPY W/ ROTATOR CUFF REPAIR  07/09/2012   left  . SKIN LESION EXCISION     Benign  . WISDOM TOOTH EXTRACTION  YRS AGO    Current Outpatient Medications  Medication Sig Dispense Refill  . ALLERGIST TRAY 1/2CC 27GX1/2" 27G X 1/2"  0.5 ML KIT USE AS DIRECTED WEEKLY FOR ALLERGY INJECTIONS  6  . Blood Glucose Monitoring Suppl (RELION CONFIRM GLUCOSE MONITOR) W/DEVICE KIT USE AS DIRECTED TO TEST BLOOD GLUCOSE DAILY DX: 250.00 1 kit 0  . Cholecalciferol (VITAMIN D-3) 5000 UNITS TABS Take 1 tablet by mouth daily.    . clomiPHENE (CLOMID) 50 MG tablet Take 25 mg by mouth daily.  11  . cyclobenzaprine (FLEXERIL) 10 MG tablet Take 10 mg by mouth as needed for muscle spasms.    Marland Kitchen  docusate sodium (COLACE) 100 MG capsule Take 200 mg by mouth daily.     Marland Kitchen EPIPEN 2-PAK 0.3 MG/0.3ML SOAJ injection Inject 0.3 mg into the skin as needed (allergies).     . hydrochlorothiazide (HYDRODIURIL) 25 MG tablet TAKE 1 TABLET BY MOUTH EVERY DAY 90 tablet 1  . HYDROcodone-acetaminophen (NORCO) 10-325 MG tablet Take 1 tablet by mouth every 6 (six) hours as needed.    . [name] TAKE 1 TABLET (100 MG TOTAL) BY MOUTH DAILY BEFORE BREAKFAST. 30 tablet 5  . LIVALO 4 MG TABS TAKE 1 TABLET (4 MG TOTAL) BY MOUTH DAILY. 30 tablet 11  . magnesium oxide (MAG-OX) 400 MG tablet Take 800 mg by mouth daily.    Marland Kitchen omeprazole (PRILOSEC) 20 MG capsule TAKE 1 CAPSULE (20 MG TOTAL) BY MOUTH DAILY. 90 capsule 1  . [name] Take 1 tablet by mouth daily. Reported on 01/11/2016    . pyridoxine (B-6) 200 MG tablet Take 200 mg by mouth daily.    . rivaroxaban (XARELTO) 10 MG TABS tablet Take 1 tablet (10 mg total) by mouth daily. 30 tablet 6  . VASCEPA 1 g CAPS TAKE 2 CAPSULES BY MOUTH TWICE A DAY AS DIRECTED 120 capsule 3  . zolpidem (AMBIEN) 5 MG tablet Take 1 tablet (5 mg total) by mouth at bedtime as needed for sleep. 30 tablet 0   Allergies  Allergen Reactions  . Tetanus Toxoids Anaphylaxis  . Daptomycin Other (See Comments)    myalgias  . Asa [Aspirin] Nausea Only and Rash  . Citrus Diarrhea    All citrus fruits  . Milk-Related Compounds Diarrhea    Milk and milk products  . Oxycodone   . Penicillins Rash  . Prednisone (Pak) [Prednisone] Other (See Comments)    Hyperactivity WITH ORAL, CAN TAKE SHOTS-Cannot do Dose pack    Social History   Socioeconomic History  . Marital status: Married   Family History  Problem Relation Age of Onset  . Heart disease Mother 77       Deceased  . Hyperlipidemia Mother   . Hypertension Mother   . Heart disease Father 3       Deceased  . CVA Father   . Hypertension Maternal Grandmother   . Hyperlipidemia Maternal Grandmother    . Cancer Maternal Grandmother        Colon Cancer  . Heart disease Maternal Grandfather   . Hypertension Maternal Grandfather   . Hyperlipidemia Maternal Grandfather   . Cancer Maternal Grandfather        Lung Cancer  . Heart attack Maternal Grandfather   . Heart disease Paternal Grandfather      Review of Systems:  Constitutional:  no unexpected change in weight, no weakness, no unexplained fevers, sweats, or chills Eye:  no recent significant change in vision Ear:  no hearing loss Nose/Mouth/Throat:  No dental complaints Neck/Thyroid:  no lumps or masses Pulmonary:  no chronic  cough, sputum, or hemoptysis and no shortness of breath Cardiovascular:  no exercise intolerance, no chest pain Gastrointestinal:  no abdominal pain and no change in bowel habits GU:  negative for dysuria, frequency, and incontinence Musculoskeletal/Extremities:  no peripheral edema Skin/Integumentary ROS:  no abnormal skin lesions reported Neurologic:  no numbness, tingling, or tremor   Objective:   Vitals:   11/22/17 1058  BP: 118/78  Pulse: 71  Temp: 98.1 F (36.7 C)  TempSrc: Oral  SpO2: 97%  Weight: 224 lb 4 oz (101.7 kg)  Height: 5' 8" (1.727 m)   Body mass index is 34.1 kg/m.  General:  well developed, well nourished, in no apparent distress Skin:  warm, no pallor or diaphoresis Head:  normocephalic, atraumatic Eyes:  pupils equal and round, sclera anicteric without injection Ears:  canals without lesions, TMs shiny without retraction, no obvious effusion, no erythema Throat/Pharynx:  lips and gingiva without lesion; tongue and uvula midline; non-inflamed pharynx; no exudates or postnasal drainage Neck: neck supple without adenopathy, thyromegaly, or masses, no bruits, no jugular venous distention Lungs:  clear to auscultation, breath sounds equal bilaterally, no respiratory distress Cardio:  regular rate and rhythm without murmurs Abdomen:  abdomen soft, nontender; bowel sounds  normal; no masses, hepatomegaly or splenomegaly Musculoskeletal:  symmetrical muscle groups noted without atrophy or deformity Extremities:  no clubbing, cyanosis, or edema, no deformities, no skin discoloration Neuro:  gait normal; deep tendon reflexes normal and symmetric and alert and oriented to person, place, and time Psych: Age appropriate judgment and insight; normal mood   Assessment:   Pre-op evaluation - Plan: Hemoglobin A1c, CBC, Basic metabolic panel  Type 2 diabetes mellitus without complication, without long-term current use of insulin ([place]) - Plan: Microalbumin / creatinine urine ratio, HM Diabetes Foot Exam   Plan:   Orders as above. EKG - from 08/2017 reviewed; ordered by cardiology team.  1% risk of major cardiac death according to Revised Goldman's.  Labs ordered. Pending the above workup, the patient is deemed low cardiac risk for the proposed procedure. F/u in 6 mo.   The patient voiced understanding and agreement to the plan.  Numa, DO 11/22/17  11:34 AM

## 2017-11-22 NOTE — Progress Notes (Signed)
Pre visit review using our clinic review tool, if applicable. No additional management support is needed unless otherwise documented below in the visit note. 

## 2017-11-22 NOTE — Patient Instructions (Signed)
Let us know if you need anything.  

## 2017-11-30 ENCOUNTER — Ambulatory Visit: Payer: Self-pay | Admitting: Orthopedic Surgery

## 2017-12-07 ENCOUNTER — Ambulatory Visit: Payer: Self-pay | Admitting: Orthopedic Surgery

## 2017-12-07 NOTE — H&P (View-Only) (Signed)
Ian [name] is an 56 y.o. male.   Chief Complaint: R shoulder pain HPI: The patient is a 56 year old male who presents today for follow up of their shoulder. The patient is being followed for their right shoulder pain. They are 1 1/2 month out from flare up. Symptoms reported today include: pain. and report their pain level to be 4 / 10 (but can bve 8/10). Current treatment includes: pain medications and Robaxin. The following medication has been used for pain control: Norco. The patient presents today following MRI.  Past Medical History:  Diagnosis Date  . Allergy   . Arthritis   . Biceps tendon rupture    bilateral  . Clostridium difficile infection 2014  . Colon polyps   . Diabetes ([place])    Borderline/Pre-diabetes  . GERD (gastroesophageal reflux disease)   . History of DVT (deep vein thrombosis)   . History of pulmonary embolism   . Hyperlipidemia   . Hypertension   . Internal bleeding hemorrhoids 2012   "might bleed once/month; only when I strain"  . Internal prolapsed hemorrhoids   . Leg cramps   . Testicular hypofunction     Past Surgical History:  Procedure Laterality Date  . ANAL FISSURE REPAIR  06/24/2013  . APPENDECTOMY  2011  . COLONOSCOPY W/ BIOPSIES AND POLYPECTOMY  2012  . COLONSCOPY  2015  . EXCISIONAL HEMORRHOIDECTOMY    . FISSURECTOMY    . HEMORRHOID SURGERY  06/24/2013  . internal sphincterotomy  06-24-2013  . LUMBAR LAMINECTOMY/DECOMPRESSION MICRODISCECTOMY N/A 01/20/2015   Procedure: MICRO LUMBAR DECOMPRESSION L4-5/L3-4/L2-3;  Surgeon: Johnn Hai, MD;  Location: [facility];  Service: Orthopedics;  Laterality: N/A;  . SHOULDER ARTHROSCOPY  08/22/2012   Procedure: ARTHROSCOPY SHOULDER;  Surgeon: Marin Shutter, MD;  Location: [facility];  Service: Orthopedics;  Laterality: Left;  Left shoulder arthroscopic lavage and debridement  . SHOULDER ARTHROSCOPY  09/02/2012   Procedure: ARTHROSCOPY SHOULDER;  Surgeon: Marin Shutter, MD;  Location: [facility];  Service:  Orthopedics;  Laterality: Left;  Left shoulder arthroscopy irrigation and debridement  . SHOULDER ARTHROSCOPY W/ ROTATOR CUFF REPAIR  07/09/2012   left  . SKIN LESION EXCISION     Benign  . WISDOM TOOTH EXTRACTION  YRS AGO    Family History  Problem Relation Age of Onset  . Heart disease Mother 48       Deceased  . Hyperlipidemia Mother   . Hypertension Mother   . Heart disease Father 24       Deceased  . CVA Father   . Hypertension Maternal Grandmother   . Hyperlipidemia Maternal Grandmother   . Cancer Maternal Grandmother        Colon Cancer  . Heart disease Maternal Grandfather   . Hypertension Maternal Grandfather   . Hyperlipidemia Maternal Grandfather   . Cancer Maternal Grandfather        Lung Cancer  . Heart attack Maternal Grandfather   . Heart disease Paternal Grandfather    Social History:  reports that  has never smoked. he has never used smokeless tobacco. He reports that he does not drink alcohol or use drugs.  Allergies:  Allergies  Allergen Reactions  . Tetanus Toxoids Anaphylaxis  . Daptomycin Other (See Comments)    myalgias  . Asa [Aspirin] Nausea Only and Rash  . Citrus Diarrhea and Other (See Comments)    All citrus fruits  . Milk-Related Compounds Diarrhea and Other (See Comments)    Milk and milk  products  . Oxycodone Other (See Comments)    Unknown  . Penicillins Rash and Other (See Comments)    Has patient had a PCN reaction causing immediate rash, facial/tongue/throat swelling, SOB or lightheadedness with hypotension: Unknown Has patient had a PCN reaction causing severe rash involving mucus membranes or skin necrosis: No Has patient had a PCN reaction that required hospitalization: No Has patient had a PCN reaction occurring within the last 10 years: No If all of the above answers are "NO", then may proceed with Cephalosporin use.   . Prednisone (Pak) [Prednisone] Other (See Comments)    Hyperactivity WITH ORAL, CAN TAKE SHOTS-Cannot do  Dose pack     (Not in a hospital admission)  No results found for this or any previous visit (from the past 48 hour(s)). No results found.  Review of Systems  Constitutional: Negative.   HENT: Negative.   Eyes: Negative.   Respiratory: Negative.   Cardiovascular: Negative.   Gastrointestinal: Negative.   Genitourinary: Negative.   Musculoskeletal: Positive for joint pain.  Skin: Negative.   Neurological: Negative.   Psychiatric/Behavioral: Negative.     There were no vitals taken for this visit. Physical Exam  Constitutional: He is oriented to person, place, and time. He appears well-developed.  HENT:  Head: Normocephalic.  Eyes: Pupils are equal, round, and reactive to light.  Neck: Normal range of motion.  Cardiovascular: Normal rate.  Respiratory: Effort normal.  GI: Soft.  Musculoskeletal:  My distress. Tender into the subacromial positive impingement sign right shoulder weak in external rotation we can abduction cervical spine is unremarkable. Nontender over the before meals. Elbow and wrist exam is unremarkable. Nontender thoracic  Neurological: He is alert and oriented to person, place, and time.    MRI demonstrates full-thickness tear retracted rotator cuff. Mild AC arthrosis.  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 We discussed rotator cuff repair mini open. Risks and benefits including bleeding infection suboptimal range of motion and inability to repair the extended recovery limited range of motion following that.  I had a long discussion with the patient concerning the risks and benefits of a rotator cuff repair, including bleeding, infection, prolonged postoperative recovery, which may require 3 to 5 months until maximum medical improvement. Overight procedure with initiation of early passive range of motion within physical therapy. Avoid any active motion for the first six weeks.  This is all in an effort to avoid recurrent tear of the rotator cuff and adhesive capsulitis. Return to work without use of the arm can be obtained following two weeks. However, driving will be a challenge. We also discussed the possibility of requiring implants including bone anchors,as well as an Allograft patch graft if a massive rotator cuff tear is encountered. Removal of any bones for spurs as well as bursitis will be performed during the procedure and also any associated anesthetic complications as well.  He is on relatively will need a clearance and to come off his Xarelto a day before and resume it the day following that.  We discussed his issue with another surgeon on the left side. He did have a postoperative infection and apparently a reaction to the implanted devices. Perhaps a bioabsorbable screw.  He has preferred not to have those implants. We will investigate that further. A utilize nonreactive implants that are made with PEEK.  If those implants were the same he is asked about just a bony trough repair to.  We certainly could proceed with that. Again preoperative clearance. We discussed the recovery.  Plan mini open RCR Right shoulder  Cecilie Kicks., PA-C for Dr. Tonita Cong 12/07/2017, 11:31 AM

## 2017-12-07 NOTE — Patient Instructions (Addendum)
Ian [name]  12/07/2017   Your procedure is scheduled on: Thursday, Dec. 20, 2018   Report to Ian [name] Main  Entrance   Take [place]  elevators to 3rd floor to  Ian [name] at 8:15 AM.    Call this number if you have problems the morning of surgery [phone]    Remember: ONLY 1 PERSON MAY GO WITH YOU TO SHORT STAY TO GET  READY MORNING OF Ian [name].   Do not eat food or drink liquids :After Midnight.   Take these medicines the morning of surgery with A SIP OF WATER: Omeprazole, Livalo, Clomiphene Citrate   DO NOT TAKE ANY DIABETIC MEDICATIONS DAY OF YOUR SURGERY                               You may not have any metal on your body including  jewelry and body piercings               Do not wear lotions, powders, perfumes, or deodorant                          Men may shave face and neck.   Do not bring valuables to the hospital. Ian [name].   Contacts, dentures or bridgework may not be worn into surgery.   Leave suitcase in the car. After surgery it may be brought to your room.              Please read over the following fact sheets you were given: _____________________________________________________________________ Ian [name] - Preparing for Surgery Before surgery, you can play an important role.  Because skin is not sterile, your skin needs to be as free of germs as possible.  You can reduce the number of germs on your skin by washing with CHG (chlorahexidine gluconate) soap before surgery.  CHG is an antiseptic cleaner which kills germs and bonds with the skin to continue killing germs even after washing. Please DO NOT use if you have an allergy to CHG or antibacterial soaps.  If your skin becomes reddened/irritated stop using the CHG and inform your nurse when you arrive at Short Stay. Do not shave (including legs and underarms) for at least 48 hours prior to the first CHG shower.  You  may shave your face/neck.  Please follow these instructions carefully:  1.  Shower with CHG Soap the night before surgery and the  morning of surgery.  2.  If you choose to wash your hair, wash your hair first as usual with your normal  shampoo.  3.  After you shampoo, rinse your hair and body thoroughly to remove the shampoo.                             4.  Use CHG as you would any other liquid soap.  You can apply chg directly to the skin and wash.  Gently with a scrungie or clean washcloth.  5.  Apply the CHG Soap to your body ONLY FROM THE NECK DOWN.   Do   not use on face/ open  Wound or open sores. Avoid contact with eyes, ears mouth and   genitals (private parts).                       Wash face,  Genitals (private parts) with your normal soap.             6.  Wash thoroughly, paying special attention to the area where your    surgery  will be performed.  7.  Thoroughly rinse your body with warm water from the neck down.  8.  DO NOT shower/wash with your normal soap after using and rinsing off the CHG Soap.                9.  Pat yourself dry with a clean towel.            10.  Wear clean pajamas.            11.  Place clean sheets on your bed the night of your first shower and do not  sleep with pets. Day of Surgery : Do not apply any lotions/deodorants the morning of surgery.  Please wear clean clothes to the hospital/surgery [name].  FAILURE TO FOLLOW THESE INSTRUCTIONS MAY RESULT IN THE CANCELLATION OF YOUR SURGERY  PATIENT SIGNATURE_________________________________  NURSE SIGNATURE__________________________________  ________________________________________________________________________   Ian [name]  An incentive spirometer is a tool that can help keep your lungs clear and active. This tool measures how well you are filling your lungs with each breath. Taking long deep breaths may help reverse or decrease the chance of developing breathing  (pulmonary) problems (especially infection) following:  A long period of time when you are unable to move or be active. BEFORE THE PROCEDURE   If the spirometer includes an indicator to show your best effort, your nurse or respiratory therapist will set it to a desired goal.  If possible, sit up straight or lean slightly forward. Try not to slouch.  Hold the incentive spirometer in an upright position. INSTRUCTIONS FOR USE  1. Sit on the edge of your bed if possible, or sit up as far as you can in bed or on a chair. 2. Hold the incentive spirometer in an upright position. 3. Breathe out normally. 4. Place the mouthpiece in your mouth and seal your lips tightly around it. 5. Breathe in slowly and as deeply as possible, raising the piston or the ball toward the top of the column. 6. Hold your breath for 3-5 seconds or for as long as possible. Allow the piston or ball to fall to the bottom of the column. 7. Remove the mouthpiece from your mouth and breathe out normally. 8. Rest for a few seconds and repeat Steps 1 through 7 at least 10 times every 1-2 hours when you are awake. Take your time and take a few normal breaths between deep breaths. 9. The spirometer may include an indicator to show your best effort. Use the indicator as a goal to work toward during each repetition. 10. After each set of 10 deep breaths, practice coughing to be sure your lungs are clear. If you have an incision (the cut made at the time of surgery), support your incision when coughing by placing a pillow or rolled up towels firmly against it. Once you are able to get out of bed, walk around indoors and cough well. You may stop using the incentive spirometer when instructed by your caregiver.  RISKS AND COMPLICATIONS  Take your time  so you do not get dizzy or light-headed.  If you are in pain, you may need to take or ask for pain medication before doing incentive spirometry. It is harder to take a deep breath if you  are having pain. AFTER USE  Rest and breathe slowly and easily.  It can be helpful to keep track of a log of your progress. Your caregiver can provide you with a simple table to help with this. If you are using the spirometer at home, follow these instructions: [place] IF:   You are having difficultly using the spirometer.  You have trouble using the spirometer as often as instructed.  Your pain medication is not giving enough relief while using the spirometer.  You develop fever of 100.5 F (38.1 C) or higher. SEEK IMMEDIATE MEDICAL CARE IF:   You cough up bloody sputum that had not been present before.  You develop fever of 102 F (38.9 C) or greater.  You develop worsening pain at or near the incision site. MAKE SURE YOU:   Understand these instructions.  Will watch your condition.  Will get help right away if you are not doing well or get worse. Document Released: 04/23/2007 Document Revised: 03/04/2012 Document Reviewed: 06/24/2007 [facility] Patient Information 2014 [place], [place].   ________________________________________________________________________

## 2017-12-07 NOTE — Pre-Procedure Instructions (Signed)
The following are in epic:   Medical clearance in last office note Dr. Nani Ravens  EKG 09/13/17 Hgb A 1 C 6.3 11/22/17  Medical clearance Dr Nani Ravens in chart

## 2017-12-07 NOTE — H&P (Signed)
Ian [name] is an 56 y.o. male.   Chief Complaint: R shoulder pain HPI: The patient is a 56 year old male who presents today for follow up of their shoulder. The patient is being followed for their right shoulder pain. They are 1 1/2 month out from flare up. Symptoms reported today include: pain. and report their pain level to be 4 / 10 (but can bve 8/10). Current treatment includes: pain medications and Robaxin. The following medication has been used for pain control: Norco. The patient presents today following MRI.  Past Medical History:  Diagnosis Date  . Allergy   . Arthritis   . Biceps tendon rupture    bilateral  . Clostridium difficile infection 2014  . Colon polyps   . Diabetes ([place])    Borderline/Pre-diabetes  . GERD (gastroesophageal reflux disease)   . History of DVT (deep vein thrombosis)   . History of pulmonary embolism   . Hyperlipidemia   . Hypertension   . Internal bleeding hemorrhoids 2012   "might bleed once/month; only when I strain"  . Internal prolapsed hemorrhoids   . Leg cramps   . Testicular hypofunction     Past Surgical History:  Procedure Laterality Date  . ANAL FISSURE REPAIR  06/24/2013  . APPENDECTOMY  2011  . COLONOSCOPY W/ BIOPSIES AND POLYPECTOMY  2012  . COLONSCOPY  2015  . EXCISIONAL HEMORRHOIDECTOMY    . FISSURECTOMY    . HEMORRHOID SURGERY  06/24/2013  . internal sphincterotomy  06-24-2013  . LUMBAR LAMINECTOMY/DECOMPRESSION MICRODISCECTOMY N/A 01/20/2015   Procedure: MICRO LUMBAR DECOMPRESSION L4-5/L3-4/L2-3;  Surgeon: Johnn Hai, MD;  Location: [facility];  Service: Orthopedics;  Laterality: N/A;  . SHOULDER ARTHROSCOPY  08/22/2012   Procedure: ARTHROSCOPY SHOULDER;  Surgeon: Marin Shutter, MD;  Location: [facility];  Service: Orthopedics;  Laterality: Left;  Left shoulder arthroscopic lavage and debridement  . SHOULDER ARTHROSCOPY  09/02/2012   Procedure: ARTHROSCOPY SHOULDER;  Surgeon: Marin Shutter, MD;  Location: [facility];  Service:  Orthopedics;  Laterality: Left;  Left shoulder arthroscopy irrigation and debridement  . SHOULDER ARTHROSCOPY W/ ROTATOR CUFF REPAIR  07/09/2012   left  . SKIN LESION EXCISION     Benign  . WISDOM TOOTH EXTRACTION  YRS AGO    Family History  Problem Relation Age of Onset  . Heart disease Mother 29       Deceased  . Hyperlipidemia Mother   . Hypertension Mother   . Heart disease Father 45       Deceased  . CVA Father   . Hypertension Maternal Grandmother   . Hyperlipidemia Maternal Grandmother   . Cancer Maternal Grandmother        Colon Cancer  . Heart disease Maternal Grandfather   . Hypertension Maternal Grandfather   . Hyperlipidemia Maternal Grandfather   . Cancer Maternal Grandfather        Lung Cancer  . Heart attack Maternal Grandfather   . Heart disease Paternal Grandfather    Social History:  reports that  has never smoked. he has never used smokeless tobacco. He reports that he does not drink alcohol or use drugs.  Allergies:  Allergies  Allergen Reactions  . Tetanus Toxoids Anaphylaxis  . Daptomycin Other (See Comments)    myalgias  . Asa [Aspirin] Nausea Only and Rash  . Citrus Diarrhea and Other (See Comments)    All citrus fruits  . Milk-Related Compounds Diarrhea and Other (See Comments)    Milk and milk  products  . Oxycodone Other (See Comments)    Unknown  . Penicillins Rash and Other (See Comments)    Has patient had a PCN reaction causing immediate rash, facial/tongue/throat swelling, SOB or lightheadedness with hypotension: Unknown Has patient had a PCN reaction causing severe rash involving mucus membranes or skin necrosis: No Has patient had a PCN reaction that required hospitalization: No Has patient had a PCN reaction occurring within the last 10 years: No If all of the above answers are "NO", then may proceed with Cephalosporin use.   . Prednisone (Pak) [Prednisone] Other (See Comments)    Hyperactivity WITH ORAL, CAN TAKE SHOTS-Cannot do  Dose pack     (Not in a hospital admission)  No results found for this or any previous visit (from the past 48 hour(s)). No results found.  Review of Systems  Constitutional: Negative.   HENT: Negative.   Eyes: Negative.   Respiratory: Negative.   Cardiovascular: Negative.   Gastrointestinal: Negative.   Genitourinary: Negative.   Musculoskeletal: Positive for joint pain.  Skin: Negative.   Neurological: Negative.   Psychiatric/Behavioral: Negative.     There were no vitals taken for this visit. Physical Exam  Constitutional: He is oriented to person, place, and time. He appears well-developed.  HENT:  Head: Normocephalic.  Eyes: Pupils are equal, round, and reactive to light.  Neck: Normal range of motion.  Cardiovascular: Normal rate.  Respiratory: Effort normal.  GI: Soft.  Musculoskeletal:  My distress. Tender into the subacromial positive impingement sign right shoulder weak in external rotation we can abduction cervical spine is unremarkable. Nontender over the before meals. Elbow and wrist exam is unremarkable. Nontender thoracic  Neurological: He is alert and oriented to person, place, and time.    MRI demonstrates full-thickness tear retracted rotator cuff. Mild AC arthrosis.  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 We discussed rotator cuff repair mini open. Risks and benefits including bleeding infection suboptimal range of motion and inability to repair the extended recovery limited range of motion following that.  I had a long discussion with the patient concerning the risks and benefits of a rotator cuff repair, including bleeding, infection, prolonged postoperative recovery, which may require 3 to 5 months until maximum medical improvement. Overight procedure with initiation of early passive range of motion within physical therapy. Avoid any active motion for the first six weeks.  This is all in an effort to avoid recurrent tear of the rotator cuff and adhesive capsulitis. Return to work without use of the arm can be obtained following two weeks. However, driving will be a challenge. We also discussed the possibility of requiring implants including bone anchors,as well as an Allograft patch graft if a massive rotator cuff tear is encountered. Removal of any bones for spurs as well as bursitis will be performed during the procedure and also any associated anesthetic complications as well.  He is on relatively will need a clearance and to come off his Xarelto a day before and resume it the day following that.  We discussed his issue with another surgeon on the left side. He did have a postoperative infection and apparently a reaction to the implanted devices. Perhaps a bioabsorbable screw.  He has preferred not to have those implants. We will investigate that further. A utilize nonreactive implants that are made with PEEK.  If those implants were the same he is asked about just a bony trough repair to.  We certainly could proceed with that. Again preoperative clearance. We discussed the recovery.  Plan mini open RCR Right shoulder  Cecilie Kicks., PA-C for Dr. Tonita Cong 12/07/2017, 11:31 AM

## 2017-12-10 ENCOUNTER — Encounter (HOSPITAL_COMMUNITY)
Admission: RE | Admit: 2017-12-10 | Discharge: 2017-12-10 | Disposition: A | Discharge status: Home / Self Care | Payer: BLUE CROSS/BLUE SHIELD | Source: Ambulatory Visit | Attending: Specialist | Admitting: Specialist

## 2017-12-10 ENCOUNTER — Encounter (HOSPITAL_COMMUNITY): Payer: Self-pay

## 2017-12-10 ENCOUNTER — Other Ambulatory Visit: Payer: Self-pay

## 2017-12-10 DIAGNOSIS — M75101 Unspecified rotator cuff tear or rupture of right shoulder, not specified as traumatic: Secondary | ICD-10-CM | POA: Insufficient documentation

## 2017-12-10 DIAGNOSIS — Z01812 Encounter for preprocedural laboratory examination: Secondary | ICD-10-CM | POA: Diagnosis present

## 2017-12-10 HISTORY — DX: Viral wart, unspecified: B07.9

## 2017-12-10 LAB — BASIC METABOLIC PANEL
ANION GAP: 6 (ref 5–15)
BUN: 16 mg/dL (ref 6–20)
CALCIUM: 9.4 mg/dL (ref 8.9–10.3)
CO2: 30 mmol/L (ref 22–32)
CREATININE: 1.02 mg/dL (ref 0.61–1.24)
Chloride: 102 mmol/L (ref 101–111)
GFR calc Af Amer: >60 mL/min (ref >60)
GLUCOSE: 126 mg/dL — AB (ref 65–99)
Potassium: 4.2 mmol/L (ref 3.5–5.1)
Sodium: 138 mmol/L (ref 135–145)

## 2017-12-10 LAB — CBC
HCT: 48.2 % (ref 39.0–52.0)
HEMOGLOBIN: 17.2 g/dL — AB (ref 13.0–17.0)
MCH: 32.8 pg (ref 26.0–34.0)
MCHC: 35.7 g/dL (ref 30.0–36.0)
MCV: 91.8 fL (ref 78.0–100.0)
Platelets: 118 10*3/uL — ABNORMAL LOW (ref 150–400)
RBC: 5.25 MIL/uL (ref 4.22–5.81)
RDW: 13.3 % (ref 11.5–15.5)
WBC: 6.5 10*3/uL (ref 4.0–10.5)

## 2017-12-10 LAB — SURGICAL PCR SCREEN
MRSA, PCR: POSITIVE — AB
Staphylococcus aureus: POSITIVE — AB

## 2017-12-10 LAB — GLUCOSE, CAPILLARY: GLUCOSE-CAPILLARY: 128 mg/dL — AB (ref 65–99)

## 2017-12-10 NOTE — Pre-Procedure Instructions (Signed)
PCR, CBC and BMP results faxed to Dr. Tonita Cong via epic.

## 2017-12-11 ENCOUNTER — Ambulatory Visit: Payer: Self-pay | Admitting: Orthopedic Surgery

## 2017-12-12 MED ORDER — VANCOMYCIN HCL 10 G IV SOLR
1500.0000 mg | INTRAVENOUS | Status: AC
  Administered 2017-12-13: 1500 mg via INTRAVENOUS
  Filled 2017-12-12: qty 1500

## 2017-12-13 ENCOUNTER — Ambulatory Visit (HOSPITAL_COMMUNITY): Payer: BLUE CROSS/BLUE SHIELD | Admitting: Anesthesiology

## 2017-12-13 ENCOUNTER — Other Ambulatory Visit: Payer: Self-pay

## 2017-12-13 ENCOUNTER — Ambulatory Visit (HOSPITAL_COMMUNITY)
Admission: RE | Admit: 2017-12-13 | Discharge: 2017-12-14 | Disposition: A | Discharge status: Home / Self Care | Payer: BLUE CROSS/BLUE SHIELD | Source: Ambulatory Visit | Attending: Specialist | Admitting: Specialist

## 2017-12-13 ENCOUNTER — Encounter (HOSPITAL_COMMUNITY)
Admission: RE | Disposition: A | Discharge status: Home / Self Care | Payer: Self-pay | Source: Ambulatory Visit | Attending: Specialist

## 2017-12-13 ENCOUNTER — Encounter (HOSPITAL_COMMUNITY): Payer: Self-pay | Admitting: Emergency Medicine

## 2017-12-13 DIAGNOSIS — E785 Hyperlipidemia, unspecified: Secondary | ICD-10-CM | POA: Insufficient documentation

## 2017-12-13 DIAGNOSIS — Z6834 Body mass index (BMI) 34.0-34.9, adult: Secondary | ICD-10-CM | POA: Insufficient documentation

## 2017-12-13 DIAGNOSIS — Z86718 Personal history of other venous thrombosis and embolism: Secondary | ICD-10-CM | POA: Insufficient documentation

## 2017-12-13 DIAGNOSIS — M75120 Complete rotator cuff tear or rupture of unspecified shoulder, not specified as traumatic: Secondary | ICD-10-CM | POA: Diagnosis present

## 2017-12-13 DIAGNOSIS — K219 Gastro-esophageal reflux disease without esophagitis: Secondary | ICD-10-CM | POA: Diagnosis not present

## 2017-12-13 DIAGNOSIS — Z887 Allergy status to serum and vaccine status: Secondary | ICD-10-CM | POA: Diagnosis not present

## 2017-12-13 DIAGNOSIS — Z86711 Personal history of pulmonary embolism: Secondary | ICD-10-CM | POA: Insufficient documentation

## 2017-12-13 DIAGNOSIS — M75101 Unspecified rotator cuff tear or rupture of right shoulder, not specified as traumatic: Secondary | ICD-10-CM | POA: Insufficient documentation

## 2017-12-13 DIAGNOSIS — Z888 Allergy status to other drugs, medicaments and biological substances status: Secondary | ICD-10-CM | POA: Insufficient documentation

## 2017-12-13 DIAGNOSIS — I10 Essential (primary) hypertension: Secondary | ICD-10-CM | POA: Insufficient documentation

## 2017-12-13 DIAGNOSIS — Z880 Allergy status to penicillin: Secondary | ICD-10-CM | POA: Diagnosis not present

## 2017-12-13 DIAGNOSIS — Z885 Allergy status to narcotic agent status: Secondary | ICD-10-CM | POA: Diagnosis not present

## 2017-12-13 DIAGNOSIS — M1990 Unspecified osteoarthritis, unspecified site: Secondary | ICD-10-CM | POA: Diagnosis not present

## 2017-12-13 HISTORY — PX: SHOULDER OPEN ROTATOR CUFF REPAIR: SHX2407

## 2017-12-13 LAB — GLUCOSE, CAPILLARY
GLUCOSE-CAPILLARY: 112 mg/dL — AB (ref 65–99)
GLUCOSE-CAPILLARY: 163 mg/dL — AB (ref 65–99)
Glucose-Capillary: 103 mg/dL — ABNORMAL HIGH (ref 65–99)
Glucose-Capillary: 123 mg/dL — ABNORMAL HIGH (ref 65–99)
Glucose-Capillary: 207 mg/dL — ABNORMAL HIGH (ref 65–99)

## 2017-12-13 SURGERY — REPAIR, ROTATOR CUFF, OPEN
Anesthesia: General | Site: Shoulder | Laterality: Right

## 2017-12-13 MED ORDER — MIDAZOLAM HCL 2 MG/2ML IJ SOLN
INTRAMUSCULAR | Status: AC
  Administered 2017-12-13: 2 mg via INTRAVENOUS
  Filled 2017-12-13: qty 2

## 2017-12-13 MED ORDER — ONDANSETRON HCL 4 MG/2ML IJ SOLN
INTRAMUSCULAR | Status: DC | PRN
  Administered 2017-12-13: 4 mg via INTRAVENOUS

## 2017-12-13 MED ORDER — DOCUSATE SODIUM 50 MG PO CAPS
250.0000 mg | ORAL_CAPSULE | Freq: Every day | ORAL | Status: DC
  Administered 2017-12-13 – 2017-12-14 (×2): 250 mg via ORAL
  Filled 2017-12-13 (×2): qty 1

## 2017-12-13 MED ORDER — INSULIN ASPART 100 UNIT/ML ~~LOC~~ SOLN: 0.0000 [IU] | Freq: Three times a day (TID) | SUBCUTANEOUS | Status: DC

## 2017-12-13 MED ORDER — DEXAMETHASONE SODIUM PHOSPHATE 10 MG/ML IJ SOLN
INTRAMUSCULAR | Status: AC
Start: 2017-12-13 — End: ?
  Filled 2017-12-13: qty 1

## 2017-12-13 MED ORDER — ALUM & MAG HYDROXIDE-SIMETH 200-200-20 MG/5ML PO SUSP: 30.0000 mL | ORAL | Status: DC | PRN

## 2017-12-13 MED ORDER — FENTANYL CITRATE (PF) 100 MCG/2ML IJ SOLN
INTRAMUSCULAR | Status: AC
  Administered 2017-12-13: 100 ug via INTRAVENOUS
  Filled 2017-12-13: qty 2

## 2017-12-13 MED ORDER — RIVAROXABAN 10 MG PO TABS
10.0000 mg | ORAL_TABLET | Freq: Every day | ORAL | Status: DC
  Administered 2017-12-14: 10 mg via ORAL
  Filled 2017-12-13: qty 1

## 2017-12-13 MED ORDER — FENTANYL CITRATE (PF) 100 MCG/2ML IJ SOLN
INTRAMUSCULAR | Status: AC
  Filled 2017-12-13: qty 2

## 2017-12-13 MED ORDER — HYDROCHLOROTHIAZIDE 25 MG PO TABS
25.0000 mg | ORAL_TABLET | Freq: Every day | ORAL | Status: DC
  Administered 2017-12-14: 25 mg via ORAL
  Filled 2017-12-13: qty 1

## 2017-12-13 MED ORDER — BUPIVACAINE-EPINEPHRINE 0.5% -1:200000 IJ SOLN
INTRAMUSCULAR | Status: DC | PRN
  Administered 2017-12-13: 8 mL

## 2017-12-13 MED ORDER — METOCLOPRAMIDE HCL 5 MG PO TABS: 5.0000 mg | ORAL_TABLET | Freq: Three times a day (TID) | ORAL | Status: DC | PRN

## 2017-12-13 MED ORDER — ACETAMINOPHEN 325 MG PO TABS: 650.0000 mg | ORAL_TABLET | ORAL | Status: DC | PRN

## 2017-12-13 MED ORDER — LIDOCAINE 2% (20 MG/ML) 5 ML SYRINGE
INTRAMUSCULAR | Status: AC
  Filled 2017-12-13: qty 5

## 2017-12-13 MED ORDER — HYDROCODONE-ACETAMINOPHEN 10-325 MG PO TABS: 1.0000 | ORAL_TABLET | ORAL | 0 refills | Status: DC | PRN

## 2017-12-13 MED ORDER — SODIUM CHLORIDE 0.9 % IV SOLN
INTRAVENOUS | Status: AC
  Filled 2017-12-13: qty 500000

## 2017-12-13 MED ORDER — ZOLPIDEM TARTRATE 5 MG PO TABS
5.0000 mg | ORAL_TABLET | Freq: Every evening | ORAL | Status: DC | PRN
Start: 2017-12-13 — End: 2017-12-14
  Administered 2017-12-13: 5 mg via ORAL
  Filled 2017-12-13: qty 1

## 2017-12-13 MED ORDER — HYDROCODONE-ACETAMINOPHEN 7.5-325 MG PO TABS
1.0000 | ORAL_TABLET | ORAL | Status: DC | PRN
  Administered 2017-12-13 – 2017-12-14 (×4): 1 via ORAL
  Filled 2017-12-13 (×4): qty 1

## 2017-12-13 MED ORDER — PROPOFOL 10 MG/ML IV BOLUS
INTRAVENOUS | Status: AC
  Filled 2017-12-13: qty 20

## 2017-12-13 MED ORDER — HYDROCODONE-ACETAMINOPHEN 10-325 MG PO TABS: 2.0000 | ORAL_TABLET | ORAL | Status: DC | PRN

## 2017-12-13 MED ORDER — DEXAMETHASONE SODIUM PHOSPHATE 10 MG/ML IJ SOLN
INTRAMUSCULAR | Status: DC | PRN
  Administered 2017-12-13: 4 mg via INTRAVENOUS

## 2017-12-13 MED ORDER — PROPOFOL 10 MG/ML IV BOLUS
INTRAVENOUS | Status: DC | PRN
Start: 2017-12-13 — End: 2017-12-13
  Administered 2017-12-13: 150 mg via INTRAVENOUS

## 2017-12-13 MED ORDER — MAGNESIUM CITRATE PO SOLN: 1.0000 | Freq: Once | ORAL | Status: DC | PRN

## 2017-12-13 MED ORDER — POLYETHYLENE GLYCOL 3350 17 G PO PACK: 17.0000 g | PACK | Freq: Every day | ORAL | Status: DC | PRN

## 2017-12-13 MED ORDER — METOCLOPRAMIDE HCL 5 MG/ML IJ SOLN: 5.0000 mg | Freq: Three times a day (TID) | INTRAMUSCULAR | Status: DC | PRN

## 2017-12-13 MED ORDER — LIDOCAINE HCL (CARDIAC) 20 MG/ML IV SOLN
INTRAVENOUS | Status: DC | PRN
  Administered 2017-12-13: 60 mg via INTRAVENOUS

## 2017-12-13 MED ORDER — ACETAMINOPHEN 650 MG RE SUPP: 650.0000 mg | RECTAL | Status: DC | PRN

## 2017-12-13 MED ORDER — ROCURONIUM BROMIDE 100 MG/10ML IV SOLN
INTRAVENOUS | Status: DC | PRN
  Administered 2017-12-13: 50 mg via INTRAVENOUS

## 2017-12-13 MED ORDER — CHLORHEXIDINE GLUCONATE 4 % EX LIQD: 60.0000 mL | Freq: Once | CUTANEOUS | Status: DC

## 2017-12-13 MED ORDER — DEXTROSE 5 % IV SOLN
INTRAVENOUS | Status: DC | PRN
  Administered 2017-12-13: 25 ug/min via INTRAVENOUS

## 2017-12-13 MED ORDER — METHOCARBAMOL 500 MG PO TABS: 500.0000 mg | ORAL_TABLET | Freq: Four times a day (QID) | ORAL | 1 refills | Status: DC | PRN

## 2017-12-13 MED ORDER — BISACODYL 5 MG PO TBEC: 5.0000 mg | DELAYED_RELEASE_TABLET | Freq: Every day | ORAL | Status: DC | PRN

## 2017-12-13 MED ORDER — PANTOPRAZOLE SODIUM 40 MG PO TBEC
40.0000 mg | DELAYED_RELEASE_TABLET | Freq: Every day | ORAL | Status: DC
  Administered 2017-12-14: 40 mg via ORAL
  Filled 2017-12-13: qty 1

## 2017-12-13 MED ORDER — VANCOMYCIN HCL IN DEXTROSE 1-5 GM/200ML-% IV SOLN
1000.0000 mg | Freq: Two times a day (BID) | INTRAVENOUS | Status: AC
  Administered 2017-12-13: 23:00:00 1000 mg via INTRAVENOUS
  Filled 2017-12-13: qty 200

## 2017-12-13 MED ORDER — HYDROMORPHONE HCL 1 MG/ML IJ SOLN: 1.0000 mg | INTRAMUSCULAR | Status: DC | PRN

## 2017-12-13 MED ORDER — FENTANYL CITRATE (PF) 100 MCG/2ML IJ SOLN
50.0000 ug | INTRAMUSCULAR | Status: DC
  Administered 2017-12-13: 100 ug via INTRAVENOUS

## 2017-12-13 MED ORDER — DOCUSATE SODIUM 100 MG PO CAPS: 100.0000 mg | ORAL_CAPSULE | Freq: Two times a day (BID) | ORAL | Status: DC

## 2017-12-13 MED ORDER — SUGAMMADEX SODIUM 500 MG/5ML IV SOLN
INTRAVENOUS | Status: DC | PRN
  Administered 2017-12-13: 250 mg via INTRAVENOUS

## 2017-12-13 MED ORDER — ONDANSETRON HCL 4 MG/2ML IJ SOLN
INTRAMUSCULAR | Status: AC
  Filled 2017-12-13: qty 2

## 2017-12-13 MED ORDER — DIPHENHYDRAMINE HCL 12.5 MG/5ML PO ELIX: 12.5000 mg | ORAL_SOLUTION | ORAL | Status: DC | PRN

## 2017-12-13 MED ORDER — FENTANYL CITRATE (PF) 100 MCG/2ML IJ SOLN
INTRAMUSCULAR | Status: DC | PRN
  Administered 2017-12-13 (×2): 50 ug via INTRAVENOUS

## 2017-12-13 MED ORDER — SODIUM CHLORIDE 0.9 % IV SOLN
INTRAVENOUS | Status: DC | PRN
  Administered 2017-12-13: 500 mL

## 2017-12-13 MED ORDER — MENTHOL 3 MG MT LOZG: 1.0000 | LOZENGE | OROMUCOSAL | Status: DC | PRN

## 2017-12-13 MED ORDER — PHENOL 1.4 % MT LIQD: 1.0000 | OROMUCOSAL | Status: DC | PRN

## 2017-12-13 MED ORDER — POTASSIUM 99 MG PO TABS: 99.0000 mg | ORAL_TABLET | Freq: Every day | ORAL | Status: DC

## 2017-12-13 MED ORDER — BUPIVACAINE-EPINEPHRINE (PF) 0.5% -1:200000 IJ SOLN
INTRAMUSCULAR | Status: DC | PRN
  Administered 2017-12-13: 25 mL via PERINEURAL

## 2017-12-13 MED ORDER — SUGAMMADEX SODIUM 500 MG/5ML IV SOLN
INTRAVENOUS | Status: AC
  Filled 2017-12-13: qty 5

## 2017-12-13 MED ORDER — DEXTROSE 5 % IV SOLN
500.0000 mg | Freq: Four times a day (QID) | INTRAVENOUS | Status: DC | PRN
  Filled 2017-12-13: qty 5

## 2017-12-13 MED ORDER — ROCURONIUM BROMIDE 50 MG/5ML IV SOSY
PREFILLED_SYRINGE | INTRAVENOUS | Status: AC
  Filled 2017-12-13: qty 5

## 2017-12-13 MED ORDER — ONDANSETRON HCL 4 MG/2ML IJ SOLN: 4.0000 mg | Freq: Four times a day (QID) | INTRAMUSCULAR | Status: DC | PRN

## 2017-12-13 MED ORDER — MIDAZOLAM HCL 2 MG/2ML IJ SOLN
1.0000 mg | INTRAMUSCULAR | Status: DC
  Administered 2017-12-13: 2 mg via INTRAVENOUS

## 2017-12-13 MED ORDER — CLOMIPHENE CITRATE 50 MG PO TABS
25.0000 mg | ORAL_TABLET | Freq: Every day | ORAL | Status: DC
Start: 2017-12-14 — End: 2017-12-14
  Administered 2017-12-14: 25 mg via ORAL
  Filled 2017-12-13: qty 1

## 2017-12-13 MED ORDER — KCL IN DEXTROSE-NACL 20-5-0.45 MEQ/L-%-% IV SOLN
INTRAVENOUS | Status: DC
  Administered 2017-12-13: 15:00:00 via INTRAVENOUS
  Filled 2017-12-13 (×2): qty 1000

## 2017-12-13 MED ORDER — CEFAZOLIN SODIUM-DEXTROSE 2-4 GM/100ML-% IV SOLN
2.0000 g | INTRAVENOUS | Status: AC
  Administered 2017-12-13: 2 g via INTRAVENOUS
  Filled 2017-12-13: qty 100

## 2017-12-13 MED ORDER — ONDANSETRON HCL 4 MG PO TABS: 4.0000 mg | ORAL_TABLET | Freq: Four times a day (QID) | ORAL | Status: DC | PRN

## 2017-12-13 MED ORDER — METHOCARBAMOL 500 MG PO TABS
500.0000 mg | ORAL_TABLET | Freq: Four times a day (QID) | ORAL | Status: DC | PRN
  Administered 2017-12-13: 500 mg via ORAL
  Filled 2017-12-13: qty 1

## 2017-12-13 MED ORDER — PHENYLEPHRINE HCL 10 MG/ML IJ SOLN
INTRAMUSCULAR | Status: AC
  Filled 2017-12-13: qty 1

## 2017-12-13 MED ORDER — CYCLOBENZAPRINE HCL 10 MG PO TABS: 10.0000 mg | ORAL_TABLET | Freq: Every day | ORAL | Status: DC | PRN

## 2017-12-13 MED ORDER — BUPIVACAINE-EPINEPHRINE (PF) 0.5% -1:200000 IJ SOLN
INTRAMUSCULAR | Status: AC
  Filled 2017-12-13: qty 30

## 2017-12-13 MED ORDER — LACTATED RINGERS IV SOLN
INTRAVENOUS | Status: DC
  Administered 2017-12-13 (×2): via INTRAVENOUS

## 2017-12-13 SURGICAL SUPPLY — 50 items
ANCHOR JUGGERKNOT WTAP NDL 2.9 (Anchor) ×9 IMPLANT
ANCHOR NEEDLE 9/16 CIR SZ 8 (NEEDLE) IMPLANT
BAG ZIPLOCK 12X15 (MISCELLANEOUS) IMPLANT
BIT DRILL JUGRKNT W/NDL BIT2.9 (DRILL) ×1 IMPLANT
BLADE OSCILLATING/SAGITTAL (BLADE)
BLADE SW THK.38XMED LNG THN (BLADE) IMPLANT
CLOSURE WOUND 1/2 X4 (GAUZE/BANDAGES/DRESSINGS) ×1
CLOTH 2% CHLOROHEXIDINE 3PK (PERSONAL CARE ITEMS) ×3 IMPLANT
COVER SURGICAL LIGHT HANDLE (MISCELLANEOUS) ×3 IMPLANT
DRAPE POUCH INSTRU U-SHP 10X18 (DRAPES) ×3 IMPLANT
DRILL JUGGERKNOT W/NDL BIT 2.9 (DRILL) ×3
DRSG AQUACEL AG ADV 3.5X 4 (GAUZE/BANDAGES/DRESSINGS) ×3 IMPLANT
DURAPREP 26ML APPLICATOR (WOUND CARE) ×3 IMPLANT
ELECT NEEDLE TIP 2.8 STRL (NEEDLE) ×3 IMPLANT
ELECT REM PT RETURN 15FT ADLT (MISCELLANEOUS) ×3 IMPLANT
GLOVE BIOGEL PI IND STRL 7.0 (GLOVE) ×1 IMPLANT
GLOVE BIOGEL PI IND STRL 7.5 (GLOVE) ×3 IMPLANT
GLOVE BIOGEL PI IND STRL 8 (GLOVE) ×1 IMPLANT
GLOVE BIOGEL PI INDICATOR 7.0 (GLOVE) ×2
GLOVE BIOGEL PI INDICATOR 7.5 (GLOVE) ×6
GLOVE BIOGEL PI INDICATOR 8 (GLOVE) ×2
GLOVE SURG SS PI 7.0 STRL IVOR (GLOVE) ×3 IMPLANT
GLOVE SURG SS PI 7.5 STRL IVOR (GLOVE) ×6 IMPLANT
GLOVE SURG SS PI 8.0 STRL IVOR (GLOVE) ×3 IMPLANT
GOWN STRL REUS W/ TWL XL LVL3 (GOWN DISPOSABLE) ×2 IMPLANT
GOWN STRL REUS W/TWL XL LVL3 (GOWN DISPOSABLE) ×10 IMPLANT
KIT BASIN OR (CUSTOM PROCEDURE TRAY) ×3 IMPLANT
KIT POSITION SHOULDER SCHLEI (MISCELLANEOUS) ×3 IMPLANT
MANIFOLD NEPTUNE II (INSTRUMENTS) ×3 IMPLANT
MARKER SKIN DUAL TIP RULER LAB (MISCELLANEOUS) ×3 IMPLANT
NEEDLE SCORPION MULTI FIRE (NEEDLE) IMPLANT
PACK SHOULDER (CUSTOM PROCEDURE TRAY) ×3 IMPLANT
PAD ORTHO SHOULDER 7X19 LRG (SOFTGOODS) ×3 IMPLANT
POSITIONER SURGICAL ARM (MISCELLANEOUS) ×3 IMPLANT
SLING ARM IMMOBILIZER LRG (SOFTGOODS) IMPLANT
SLING ULTRA II L (ORTHOPEDIC SUPPLIES) ×3 IMPLANT
SPONGE LAP 4X18 X RAY DECT (DISPOSABLE) ×3 IMPLANT
STRIP CLOSURE SKIN 1/2X4 (GAUZE/BANDAGES/DRESSINGS) ×2 IMPLANT
SUT BONE WAX W31G (SUTURE) IMPLANT
SUT ETHIBOND NAB CT1 #1 30IN (SUTURE) IMPLANT
SUT FIBERWIRE #2 38 T-5 BLUE (SUTURE)
SUT PROLENE 3 0 PS 2 (SUTURE) ×3 IMPLANT
SUT TIGER TAPE 7 IN WHITE (SUTURE) IMPLANT
SUT VIC AB 1-0 CT2 27 (SUTURE) ×3 IMPLANT
SUT VIC AB 2-0 CT2 27 (SUTURE) ×3 IMPLANT
SUTURE FIBERWR #2 38 T-5 BLUE (SUTURE) IMPLANT
TAPE FIBER 2MM 7IN #2 BLUE (SUTURE) IMPLANT
TOWEL OR 17X26 10 PK STRL BLUE (TOWEL DISPOSABLE) ×3 IMPLANT
TOWEL OR NON WOVEN STRL DISP B (DISPOSABLE) ×3 IMPLANT
YANKAUER SUCT BULB TIP NO VENT (SUCTIONS) ×3 IMPLANT

## 2017-12-13 NOTE — Anesthesia Postprocedure Evaluation (Signed)
Anesthesia Post Note  Patient: Ian [name]  Procedure(s) Performed: Right shoulder mini open rotator cuff repair (Right Shoulder)     Patient location during evaluation: PACU Anesthesia Type: General Level of consciousness: awake and alert Pain management: pain level controlled Vital Signs Assessment: post-procedure vital signs reviewed and stable Respiratory status: spontaneous breathing, nonlabored ventilation, respiratory function stable and patient connected to nasal cannula oxygen Cardiovascular status: blood pressure returned to baseline and stable Postop Assessment: no apparent nausea or vomiting Anesthetic complications: no    Last Vitals:  Vitals:   12/13/17 1555 12/13/17 1655  BP: 124/67 110/71  Pulse: 67 74  Resp: 17 16  Temp: 36.7 C 36.7 C  SpO2: 95% 97%    Last Pain:  Vitals:   12/13/17 1655  TempSrc: Oral  PainSc:                  Riccardo Dubin

## 2017-12-13 NOTE — Interval H&P Note (Signed)
History and Physical Interval Note:  12/13/2017 10:15 AM  Ian [name]  has presented today for surgery, with the diagnosis of Rotator cuff tear right shoulder  The various methods of treatment have been discussed with the patient and family. After consideration of risks, benefits and other options for treatment, the patient has consented to  Procedure(s): Right shoulder mini open rotator cuff repair (Right) as a surgical intervention .  The patient's history has been reviewed, patient examined, no change in status, stable for surgery.  I have reviewed the patient's chart and labs.  Questions were answered to the patient's satisfaction.     Marijean Montanye C

## 2017-12-13 NOTE — Anesthesia Procedure Notes (Signed)
Procedure Name: Intubation Date/Time: 12/13/2017 11:18 AM Performed by: Glory Buff, CRNA Pre-anesthesia Checklist: Patient identified, Emergency Drugs available, Suction available and Patient being monitored Patient Re-evaluated:Patient Re-evaluated prior to induction Oxygen Delivery Method: Circle system utilized Preoxygenation: Pre-oxygenation with 100% oxygen Induction Type: IV induction Ventilation: Mask ventilation without difficulty Laryngoscope Size: Miller and 3 Grade View: Grade I Tube type: Oral Tube size: 7.5 mm Number of attempts: 1 Airway Equipment and Method: Stylet and Oral airway Placement Confirmation: ETT inserted through vocal cords under direct vision,  positive ETCO2 and breath sounds checked- equal and bilateral Secured at: 22 cm Tube secured with: Tape Dental Injury: Teeth and Oropharynx as per pre-operative assessment

## 2017-12-13 NOTE — Anesthesia Preprocedure Evaluation (Addendum)
Anesthesia Evaluation  Patient identified by MRN, date of birth, ID band Patient awake    Reviewed: Allergy & Precautions, H&P , NPO status , Patient's Chart, lab work & pertinent test results, reviewed documented beta blocker date and time   Airway Mallampati: II  TM Distance: >3 FB Neck ROM: Full    Dental no notable dental hx.    Pulmonary neg pulmonary ROS,    Pulmonary exam normal breath sounds clear to auscultation       Cardiovascular Exercise Tolerance: Good hypertension, Pt. on medications negative cardio ROS Normal cardiovascular exam Rhythm:Regular Rate:Normal     Neuro/Psych negative neurological ROS  negative psych ROS   GI/Hepatic Neg liver ROS, GERD  Medicated,  Endo/Other  diabetes, Type 2Morbid obesity  Renal/GU negative Renal ROS  negative genitourinary   Musculoskeletal  (+) Arthritis ,   Abdominal (+) + obese,   Peds negative pediatric ROS (+)  Hematology negative hematology ROS (+)   Anesthesia Other Findings   Reproductive/Obstetrics negative OB ROS                             Anesthesia Physical  Anesthesia Plan  ASA: III  Anesthesia Plan: General   Post-op Pain Management:    Induction: Intravenous  PONV Risk Score and Plan: 2 and Dexamethasone, Ondansetron and Treatment may vary due to age or medical condition  Airway Management Planned: Oral ETT  Additional Equipment:   Intra-op Plan:   Post-operative Plan: Extubation in OR  Informed Consent: I have reviewed the patients History and Physical, chart, labs and discussed the procedure including the risks, benefits and alternatives for the proposed anesthesia with the patient or authorized representative who has indicated his/her understanding and acceptance.   Dental advisory given  Plan Discussed with: CRNA  Anesthesia Plan Comments:         Anesthesia Quick Evaluation

## 2017-12-13 NOTE — Discharge Instructions (Signed)

## 2017-12-13 NOTE — Progress Notes (Signed)
Assisted Dr. Lyndle Herrlich with right, ultrasound guided, interscalene  block. Side rails up, monitors on throughout procedure. See vital signs in flow sheet. Tolerated Procedure well.

## 2017-12-13 NOTE — Anesthesia Procedure Notes (Signed)
Anesthesia Regional Block: Interscalene brachial plexus block   Pre-Anesthetic Checklist: ,, timeout performed, Correct Patient, Correct Site, Correct Laterality, Correct Procedure, Correct Position, site marked, Risks and benefits discussed, pre-op evaluation,  At surgeon's request and post-op pain management  Laterality: Right  Prep: chloraprep       Needles:   Needle Type: Echogenic Needle     Needle Length: 9cm  Needle Gauge: 21     Additional Needles:   Procedures:,,,, ultrasound used (permanent image in chart),,,,  Narrative:  Start time: 12/13/2017 9:52 AM End time: 12/13/2017 9:59 AM Injection made incrementally with aspirations every 5 mL. Anesthesiologist: Lyndle Herrlich, MD

## 2017-12-13 NOTE — Transfer of Care (Signed)
Immediate Anesthesia Transfer of Care Note  Patient: NAOL ONTIVEROS  Procedure(s) Performed: Right shoulder mini open rotator cuff repair (Right Shoulder)  Patient Location: PACU  Anesthesia Type:GA combined with regional for post-op pain  Level of Consciousness: awake, alert  and oriented  Airway & Oxygen Therapy: Patient Spontanous Breathing and Patient connected to face mask oxygen  Post-op Assessment: Report given to RN and Post -op Vital signs reviewed and stable  Post vital signs: Reviewed and stable  Last Vitals:  Vitals:   12/13/17 1005 12/13/17 1015  BP: (!) 142/78 132/72  Pulse: 87 83  Resp: 16 17  Temp:    SpO2: 96% 96%    Last Pain:  Vitals:   12/13/17 0851  TempSrc: Oral  PainSc: 0-No pain      Patients Stated Pain Goal: 4 (70/35/00 9381)  Complications: No apparent anesthesia complications

## 2017-12-13 NOTE — Brief Op Note (Signed)
12/13/2017  12:41 PM  PATIENT:  Ian [name]  56 y.o. male  PRE-OPERATIVE DIAGNOSIS:  Rotator cuff tear right shoulder  POST-OPERATIVE DIAGNOSIS:  Rotator cuff tear right shoulder  PROCEDURE:  Procedure(s) with comments: Right shoulder mini open rotator cuff repair (Right) - Interscalene Block  SURGEON:  Surgeon(s) and Role:    Susa Day, MD - Primary  PHYSICIAN ASSISTANT:   ASSISTANTS: [place]   ANESTHESIA:   general  EBL:  Yes minimal   BLOOD ADMINISTERED:none  DRAINS: none   LOCAL MEDICATIONS USED:  MARCAINE     SPECIMEN:  No Specimen  DISPOSITION OF SPECIMEN:  N/A  COUNTS:  YES  TOURNIQUET:  * No tourniquets in log *  DICTATION: .Other Dictation: Dictation Number [id number]  PLAN OF CARE: Admit for overnight observation  PATIENT DISPOSITION:  PACU - hemodynamically stable.   Delay start of Pharmacological VTE agent (>24hrs) due to surgical blood loss or risk of bleeding: yes

## 2017-12-14 ENCOUNTER — Encounter (HOSPITAL_COMMUNITY): Payer: Self-pay | Admitting: Specialist

## 2017-12-14 DIAGNOSIS — M75101 Unspecified rotator cuff tear or rupture of right shoulder, not specified as traumatic: Secondary | ICD-10-CM | POA: Diagnosis not present

## 2017-12-14 LAB — BASIC METABOLIC PANEL
ANION GAP: 5 (ref 5–15)
BUN: 16 mg/dL (ref 6–20)
CALCIUM: 8.6 mg/dL — AB (ref 8.9–10.3)
CO2: 27 mmol/L (ref 22–32)
CREATININE: 0.95 mg/dL (ref 0.61–1.24)
Chloride: 105 mmol/L (ref 101–111)
Glucose, Bld: 148 mg/dL — ABNORMAL HIGH (ref 65–99)
Potassium: 4.4 mmol/L (ref 3.5–5.1)
SODIUM: 137 mmol/L (ref 135–145)

## 2017-12-14 LAB — CBC
HEMATOCRIT: 43.5 % (ref 39.0–52.0)
Hemoglobin: 15.5 g/dL (ref 13.0–17.0)
MCH: 32.6 pg (ref 26.0–34.0)
MCHC: 35.6 g/dL (ref 30.0–36.0)
MCV: 91.6 fL (ref 78.0–100.0)
PLATELETS: 104 10*3/uL — AB (ref 150–400)
RBC: 4.75 MIL/uL (ref 4.22–5.81)
RDW: 13.4 % (ref 11.5–15.5)
WBC: 11.3 10*3/uL — AB (ref 4.0–10.5)

## 2017-12-14 LAB — GLUCOSE, CAPILLARY
GLUCOSE-CAPILLARY: 120 mg/dL — AB (ref 65–99)
GLUCOSE-CAPILLARY: 105 mg/dL — AB (ref 65–99)

## 2017-12-14 NOTE — Progress Notes (Signed)
Subjective: 1 Day Post-Op Procedure(s) (LRB): Right shoulder mini open rotator cuff repair (Right) Patient reports pain as mild.  Reports block still seemed to be in place as of 830am. Starting to get more motion in hand and some feeling returning. Has been trying to stay ahead of it with meds. No other c/o.   Objective: Vital signs in last 24 hours: Temp:  [97.7 F (36.5 C)-98.7 F (37.1 C)] 98.2 F (36.8 C) (12/21 0519) Pulse Rate:  [66-87] 67 (12/21 0519) Resp:  [15-21] 17 (12/21 0519) BP: (110-147)/(60-83) 113/68 (12/21 0519) SpO2:  [93 %-100 %] 93 % (12/21 0519)  Intake/Output from previous day: 12/20 0701 - 12/21 0700 In: 3903 [P.O.:1150; I.V.:2480; IV Piggyback:100] Out: 20 [Blood:20] Intake/Output this shift: Total I/O In: 360 [P.O.:360] Out: -   Recent Labs    12/14/17 0607  HGB 15.5   Recent Labs    12/14/17 0607  WBC 11.3*  RBC 4.75  HCT 43.5  PLT 104*   Recent Labs    12/14/17 0607  NA 137  K 4.4  CL 105  CO2 27  BUN 16  CREATININE 0.95  GLUCOSE 148*  CALCIUM 8.6*   No results for input(s): LABPT, INR in the last 72 hours.  Neurologically intact ABD soft Neurovascular intact Sensation intact distally Intact pulses distally Dorsiflexion/Plantar flexion intact Incision: dressing C/D/I and no drainage No cellulitis present Compartment soft no calf pain or sign of DVT  Assessment/Plan: 1 Day Post-Op Procedure(s) (LRB): Right shoulder mini open rotator cuff repair (Right) Advance diet Up with therapy D/C IV fluids  Discussed D/C instructions, dressing instructions No pendulums or ROM of R shoulder, static only. Remain in abduction pillow sling R arm, may work on ROM elbow and wrist. Discussed with OT. Discussed with Dr. Mliss Fritz, [place]. 12/14/2017, 9:57 AM

## 2017-12-14 NOTE — Op Note (Signed)
NAMEWEBER, MONNIER NO.:  [id number]  MEDICAL RECORD NO.:  [id number]  LOCATION:                                 FACILITY:  PHYSICIAN:  Susa Day, M.D.         DATE OF BIRTH:  DATE OF PROCEDURE:  12/13/2017 DATE OF DISCHARGE:                              OPERATIVE REPORT   PREOPERATIVE DIAGNOSIS:  Rotator cuff tear, retracted, right.  POSTOPERATIVE DIAGNOSIS:  Rotator cuff tear, retracted, right.  PROCEDURE PERFORMED: 1. Mini open rotator cuff repair utilizing JuggerKnot suture anchors     of supraspinatus and infraspinatus. 2. Subacromial decompression. 3. Acromioplasty.  ANESTHESIA:  General with a right upper extremity block.  HISTORY:  A 56 year old, shoulder pain, retracted tear, indicated for repair.  On the other side, he had a repair in the past, suture anchors. He apparently had a reaction to with the subsequent infection and multiple surgeries.  He had requested no permanent implants for the contralateral side.  We discussed, reviewed and decided against any bioabsorbable suture anchor type since we discussed and decided upon the [place].  Basically, a suture anchor, which we have been using. Discussed risks and benefits including bleeding, infection, damage to the neurovascular structures, no change in symptoms, worsening symptoms, DVT, PE, anesthetic complications, etc.  Also, the patient with MRSA, we gave him vancomycin and Kefzol preoperatively.  TECHNIQUE:  With the patient in supine beachchair position, after induction of adequate general anesthesia, vancomycin and Kefzol, the right upper extremity was prepped and draped in usual sterile fashion. Surgical marker was utilized to delineate the acromion, AC joint coracoid, and a 2-cm incision off the anterolateral aspect of the acromion was made in the skin.  Subcutaneous tissue was dissected. Electrocautery was utilized to achieve hemostasis.  The raphe between the anterolateral  heads was identified, divided in line with skin incision, infiltrated with 0.25% Marcaine with epinephrine.  We subperiosteally elevated the deltoid attachment off the anterolateral aspect of the acromion with an AO elevator preserving its attachment.  A small spur was removed with a 3-mm Kerrison.  Self-retaining retractor was placed.  The supraspinatus, infraspinatus retracted tear was noted. Predominantly of the supraspinatus.  I made a trough in the greater tuberosity just lateral to the articular surface, mobilized the cuff on the bursal and articular side.  It was advanced without significant difficulty into the trough.  I placed two JuggerKnot anchors into the bone just lateral to the articular surface after drilling the appropriate pilot hole, there was space by 1.5 cm posteriorly and anteriorly with excellent resistance to pullout.  They were threaded up through the supraspinatus, infraspinatus, three sets of leaflets with a Scorpion suture passer, 2 cm into the tendon.  They were then crossed and brought, and advanced into the bed.  Normally, I had used the PushLock suture anchors, but to avoid those implants by request.  We decided to place a third JuggerKnot in the lateral aspect of the greater tuberosity after drilling it, inserting the JuggerKnot, excellent resistance to pullout.  The front row sutures, we advanced the tendon into the bed, held it and then sutured it in with a slipknot and  then a surgical knot.  We then tied the ends of these sutures to the sutures over the second row from the third JuggerKnot and that the laid down the tendon quite well.  Redundant sutures were removed.  We had full coverage.  Copiously irrigated the wound.  Cautery.  No active bleeding. We closed the deltoid raphe with 1 Vicryl, subcu with 2-0, and skin with Prolene.  Sterile dressing applied.  Placed an abduction pillow, extubated without difficulty and transported to the recovery room  in satisfactory condition.  The patient tolerated the procedure well.  No complications.  Assistant, Lacie Draft, [place], was used throughout the case for patient positioning, holding and retraction.  Minimal blood loss.     Susa Day, M.D.   ______________________________ Susa Day, M.D.    Geralynn Rile  D:  12/13/2017  T:  12/14/2017  Job:  [id number]

## 2017-12-14 NOTE — Discharge Summary (Signed)
Patient ID: Ian [name] MRN: [id number] DOB/AGE: March 18, 1961 56 y.o.  Admit date: 12/13/2017 Discharge date: 12/14/2017  Admission Diagnoses:  Active Problems:   Complete rotator cuff tear   Discharge Diagnoses:  Same  Past Medical History:  Diagnosis Date  . Allergy   . Arthritis   . Biceps tendon rupture    bilateral  . Clostridium difficile infection 2014  . Colon polyps   . Diabetes ([place])    Borderline/Pre-diabetes  . GERD (gastroesophageal reflux disease)   . History of DVT (deep vein thrombosis)   . History of pulmonary embolism   . Hyperlipidemia   . Hypertension   . Internal bleeding hemorrhoids 2012   "might bleed once/month; only when I strain"  . Internal prolapsed hemorrhoids   . Leg cramps   . Testicular hypofunction   . Vertigo   . Wart of face    Right cheek    Surgeries: Procedure(s): Right shoulder mini open rotator cuff repair on 12/13/2017   Consultants:   Discharged Condition: Improved  Hospital Course: THORNE WIRZ is an 56 y.o. male who was admitted 12/13/2017 for operative treatment of<principal problem not specified>. Patient has severe unremitting pain that affects sleep, daily activities, and work/hobbies. After pre-op clearance the patient was taken to the operating room on 12/13/2017 and underwent  Procedure(s): Right shoulder mini open rotator cuff repair.    Patient was given perioperative antibiotics:  Anti-infectives (From admission, onward)   Start     Dose/Rate Route Frequency Ordered Stop   12/13/17 2200  vancomycin (VANCOCIN) IVPB 1000 mg/200 mL premix     1,000 mg 200 mL/hr over 60 Minutes Intravenous Every 12 hours 12/13/17 1402 12/14/17 0134   12/13/17 1137  polymyxin B 500,000 Units, bacitracin 50,000 Units in sodium chloride 0.9 % 500 mL irrigation  Status:  Discontinued       As needed 12/13/17 1138 12/13/17 1256   12/13/17 0830  ceFAZolin (ANCEF) IVPB 2g/100 mL premix     2 g 200 mL/hr over 30 Minutes  Intravenous On call to O.R. 12/13/17 0825 12/13/17 1205   12/13/17 0600  vancomycin (VANCOCIN) 1,500 mg in sodium chloride 0.9 % 500 mL IVPB     1,500 mg 250 mL/hr over 120 Minutes Intravenous On call to O.R. 12/12/17 1118 12/13/17 1137       Patient was given sequential compression devices, early ambulation, and chemoprophylaxis to prevent DVT.  Patient benefited maximally from hospital stay and there were no complications.    Recent vital signs:  Patient Vitals for the past 24 hrs:  BP Temp Temp src Pulse Resp SpO2  12/14/17 0519 113/68 98.2 F (36.8 C) Oral 67 17 93 %  12/14/17 0047 120/66 98.5 F (36.9 C) Oral 67 18 97 %  12/13/17 2109 119/60 98.7 F (37.1 C) Oral 78 15 96 %  12/13/17 1655 110/71 98 F (36.7 C) Oral 74 16 97 %  12/13/17 1555 124/67 98 F (36.7 C) Oral 67 17 95 %  12/13/17 1455 125/68 97.8 F (36.6 C) Oral 69 16 94 %  12/13/17 1355 121/70 97.7 F (36.5 C) - 66 16 97 %  12/13/17 1330 127/73 98 F (36.7 C) - 72 (!) 21 97 %  12/13/17 1315 129/83 98 F (36.7 C) - 82 18 97 %  12/13/17 1300 133/73 97.8 F (36.6 C) - 76 18 100 %  12/13/17 1015 132/72 - - 83 17 96 %  12/13/17 1005 (!) 142/78 - - 87 16 96 %  Recent laboratory studies:  Recent Labs    12/14/17 0607  WBC 11.3*  HGB 15.5  HCT 43.5  PLT 104*  NA 137  K 4.4  CL 105  CO2 27  BUN 16  CREATININE 0.95  GLUCOSE 148*  CALCIUM 8.6*     Discharge Medications:   Allergies as of 12/14/2017      Reactions   Tetanus Toxoids Anaphylaxis   Daptomycin Other (See Comments)   myalgias   Other    Cheese   Asa [aspirin] Nausea Only, Rash   Citrus Diarrhea, Other (See Comments)   All citrus fruits   Milk-related Compounds Diarrhea, Other (See Comments)   Milk and milk products   Oxycodone Other (See Comments)   Does not help pain just makes him high   Penicillins Rash, Other (See Comments)   Has patient had a PCN reaction causing immediate rash, facial/tongue/throat swelling, SOB or  lightheadedness with hypotension: Unknown Has patient had a PCN reaction causing severe rash involving mucus membranes or skin necrosis: No Has patient had a PCN reaction that required hospitalization: No Has patient had a PCN reaction occurring within the last 10 years: No If all of the above answers are "NO", then may proceed with Cephalosporin use.   Prednisone (pak) [prednisone] Other (See Comments)   Hyperactivity WITH ORAL, CAN TAKE SHOTS-Cannot do Dose pack      Medication List    STOP taking these medications   mupirocin ointment 2 % Commonly known as:  BACTROBAN     TAKE these medications   ALLERGIST TRAY 1/2CC 27GX1/2" 27G X 1/2" 0.5 ML Kit Generic drug:  Tuberculin-Allergy Syringes Inject 2 each as directed every Wednesday.   clomiPHENE 50 MG tablet Commonly known as:  CLOMID Take 25 mg by mouth daily.   cyclobenzaprine 10 MG tablet Commonly known as:  FLEXERIL Take 10 mg by mouth daily as needed for muscle spasms.   docusate sodium 250 MG capsule Commonly known as:  COLACE Take 250 mg by mouth daily.   [name] Generic drug:  EPINEPHrine Inject 0.3 mg into the skin once.   [name] TAKE 1 TABLET BY MOUTH EVERY DAY What changed:    how much to take  how to take this  when to take this   HYDROcodone-acetaminophen 10-325 MG tablet Commonly known as:  NORCO Take 1 tablet by mouth every 4 (four) hours as needed for moderate pain. What changed:  Another medication with the same name was added. Make sure you understand how and when to take each.   HYDROcodone-acetaminophen 10-325 MG tablet Commonly known as:  NORCO Take 1-2 tablets by mouth every 4 (four) hours as needed for severe pain. What changed:  You were already taking a medication with the same name, and this prescription was added. Make sure you understand how and when to take each.   INVOKANA 100 MG Tabs  tablet Generic drug:  canagliflozin TAKE 1 TABLET (100 MG TOTAL) BY MOUTH DAILY BEFORE BREAKFAST.   [name] TAKE 1 TABLET (4 MG TOTAL) BY MOUTH DAILY.   [name] Take 1 tablet (500 mg total) by mouth every 6 (six) hours as needed for muscle spasms. What changed:  when to take this   [name] Take 1 capsule (20 mg total) by mouth  daily.   [name] Take 99 mg by mouth daily. Reported on 01/11/2016   pyridoxine 200 MG tablet Commonly known as:  B-6 Take 200 mg by mouth daily.   [name] USE AS DIRECTED TO TEST BLOOD GLUCOSE DAILY DX: 250.00   [name] Take 1 tablet (10 mg total) by mouth daily.   [name] TAKE 2 CAPSULES BY MOUTH TWICE A DAY AS DIRECTED What changed:  See the new instructions.   [name] Take 5,000 Units by mouth daily.   [name] Take 1 tablet (5 mg total) by mouth at bedtime as needed for sleep.       Diagnostic Studies: No results found.  Disposition: 01-Home or Self Care  Discharge Instructions    Call MD / Call 911   Complete by:  As directed    If you experience chest pain or shortness of breath, CALL 911 and be transported to the hospital emergency room.  If you develope a fever above 101 F, pus (white drainage) or increased drainage or redness at the wound, or calf pain, call your surgeon's office.   Constipation Prevention   Complete by:  As directed    Drink plenty of fluids.  Prune juice may be helpful.  You may use a stool softener, such as Colace (over the counter) 100 mg twice a day.  Use MiraLax (over the counter) for constipation as needed.   Diet - low sodium heart healthy   Complete by:  As directed     Increase activity slowly as tolerated   Complete by:  As directed       Follow-up Information    Susa Day, MD Follow up in 2 week(s).   Specialty:  Orthopedic Surgery Contact information: [address] [place] [address] [phone]        Susa Day, MD In 2 weeks.   Specialty:  Orthopedic Surgery Contact information: [address] [place] [address] [phone]            Signed: Cecilie Kicks. 12/14/2017, 10:01 AM

## 2017-12-14 NOTE — Evaluation (Signed)
Occupational Therapy Evaluation Patient Details Name: Ian [name] MRN: [id number] DOB: 03/14/1961 Today's Date: 12/14/2017    History of Present Illness s/p R RCR   Clinical Impression   This 56 year old man was admitted for the above sx. All education was completed. Pt will follow up with Dr Tonita Cong for further therapy    Follow Up Recommendations  Supervision/Assistance - 24 hour    Equipment Recommendations  None recommended by OT    Recommendations for Other Services       Precautions / Restrictions Precautions Precautions: Shoulder Type of Shoulder Precautions: passive protocol. Spoke to [place], [place].  Dr Tonita Cong does not want pendulums.  Pt can come out of sling for bathing/dressing only.  No shoulder movement Restrictions Weight Bearing Restrictions: Yes Other Position/Activity Restrictions: NWB      Mobility Bed Mobility Overal bed mobility: Needs Assistance             General bed mobility comments: supervision with HOB raised. Pt plans to sleep on wedge pillow  Transfers Overall transfer level: Needs assistance Equipment used: None Transfers: Sit to/from Stand Sit to Stand: Min guard         General transfer comment: for safety    Balance                                           ADL either performed or assessed with clinical judgement   ADL Overall ADL's : Needs assistance/impaired Eating/Feeding: Set up   Grooming: Set up;Sitting   Upper Body Bathing: Moderate assistance;Sitting   Lower Body Bathing: Moderate assistance;Sit to/from stand   Upper Body Dressing : Maximal assistance;Sitting   Lower Body Dressing: Maximal assistance;Sit to/from stand   Toilet Transfer: Min guard;Ambulation;RW(chair for safety)             General ADL Comments: worked through [employer] and reviewed protocol with pt and wife.  Wife assisted with donning shirt and sling.  Pt helped support RUE during ADLs.  See education section of chart      Vision         Perception     Praxis      Pertinent Vitals/Pain Pain Assessment: 0-10 Pain Score: 4  Pain Location: R shoulder Pain Descriptors / Indicators: Sore Pain Intervention(s): Limited activity within patient's tolerance;Monitored during session;Premedicated before session;Repositioned;Ice applied     Hand Dominance Right   Extremity/Trunk Assessment Upper Extremity Assessment Upper Extremity Assessment: RUE deficits/detail(immobilized; L wfls for adls)           Communication Communication Communication: No difficulties   Cognition Arousal/Alertness: Awake/alert Behavior During Therapy: WFL for tasks assessed/performed Overall Cognitive Status: Within Functional Limits for tasks assessed                                     General Comments       Exercises     Shoulder Instructions      Home Living Family/patient expects to be discharged to:: Private residence Living Arrangements: Spouse/significant other                               Additional Comments: Pt has a walk in shower and they have a toilet extender they can use. Pt has  a cane and may walk with it. States he has balance problems in the mornings at times      Prior Functioning/Environment Level of Independence: Independent                 OT Problem List:        OT Treatment/Interventions:      OT Goals(Current goals can be found in the care plan section) Acute Rehab OT Goals Patient Stated Goal: have a good recovery OT Goal Formulation: All assessment and education complete, DC therapy  OT Frequency:     Barriers to D/C:            Co-evaluation              AM-PAC PT "6 Clicks" Daily Activity     Outcome Measure Help from another person eating meals?: A Little Help from another person taking care of personal grooming?: A Little Help from another person toileting, which includes using toliet, bedpan, or urinal?: A Little Help  from another person bathing (including washing, rinsing, drying)?: A Lot Help from another person to put on and taking off regular upper body clothing?: A Lot Help from another person to put on and taking off regular lower body clothing?: A Lot 6 Click Score: 15   End of Session    Activity Tolerance: Patient tolerated treatment well Patient left: in chair;with call bell/phone within reach;with family/visitor present  OT Visit Diagnosis: Pain Pain - Right/Left: Right Pain - part of body: Shoulder                Time: 9485-4627 OT Time Calculation (min): 50 min Charges:  OT General Charges $OT Visit: 1 Visit OT Evaluation $OT Eval Low Complexity: 1 Low OT Treatments $Self Care/Home Management : 23-37 mins G-Codes: OT G-codes **NOT FOR INPATIENT CLASS** Functional Assessment Tool Used: AM-PAC 6 Clicks Daily Activity Functional Limitation: Self care Self Care Current Status (O3500): At least 60 percent but less than 80 percent impaired, limited or restricted Self Care Goal Status (X3818): At least 60 percent but less than 80 percent impaired, limited or restricted Self Care Discharge Status [phone]): At least 60 percent but less than 80 percent impaired, limited or restricted   Lesle Chris, OTR/L [phone] 12/14/2017  Ian [name] 12/14/2017, 10:43 AM

## 2017-12-21 ENCOUNTER — Ambulatory Visit: Payer: BLUE CROSS/BLUE SHIELD | Admitting: Family Medicine

## 2017-12-24 ENCOUNTER — Other Ambulatory Visit: Payer: Self-pay | Admitting: Hematology & Oncology

## 2018-01-03 ENCOUNTER — Other Ambulatory Visit: Payer: Self-pay

## 2018-01-03 ENCOUNTER — Ambulatory Visit: Payer: BLUE CROSS/BLUE SHIELD | Attending: Orthopedic Surgery | Admitting: Physical Therapy

## 2018-01-03 DIAGNOSIS — M25511 Pain in right shoulder: Secondary | ICD-10-CM

## 2018-01-03 DIAGNOSIS — M25611 Stiffness of right shoulder, not elsewhere classified: Secondary | ICD-10-CM

## 2018-01-03 DIAGNOSIS — M6281 Muscle weakness (generalized): Secondary | ICD-10-CM | POA: Diagnosis present

## 2018-01-03 DIAGNOSIS — R293 Abnormal posture: Secondary | ICD-10-CM | POA: Diagnosis present

## 2018-01-03 NOTE — Therapy (Signed)
[facility] [address]  [place], [place], [address] Phone: [phone]   Fax:  [phone]  Physical Therapy Evaluation  Patient Details  Name: Ian [name] MRN: [id number] Date of Birth: August 14, 1961 Referring Provider: Lacie Draft, PA-C for Donnald Garre, MD   Encounter Date: 01/03/2018  PT End of Session - 01/03/18 1342    Visit Number  1    Number of Visits  24    Date for PT Re-Evaluation  03/29/18    Authorization Type  BCBS - VL: 85    PT Start Time  2119    PT Stop Time  1430    PT Time Calculation (min)  48 min    Activity Tolerance  Patient tolerated treatment well    Behavior During Therapy  [facility] for tasks assessed/performed       Past Medical History:  Diagnosis Date  . Allergy   . Arthritis   . Biceps tendon rupture    bilateral  . Clostridium difficile infection 2014  . Colon polyps   . Diabetes ([place])    Borderline/Pre-diabetes  . GERD (gastroesophageal reflux disease)   . History of DVT (deep vein thrombosis)   . History of pulmonary embolism   . Hyperlipidemia   . Hypertension   . Internal bleeding hemorrhoids 2012   "might bleed once/month; only when I strain"  . Internal prolapsed hemorrhoids   . Leg cramps   . Testicular hypofunction   . Vertigo   . Wart of face    Right cheek    Past Surgical History:  Procedure Laterality Date  . ANAL FISSURE REPAIR  06/24/2013  . APPENDECTOMY  2011  . COLONOSCOPY W/ BIOPSIES AND POLYPECTOMY  2012  . COLONSCOPY  2015  . EXCISIONAL HEMORRHOIDECTOMY    . FISSURECTOMY    . HEMORRHOID SURGERY  06/24/2013  . internal sphincterotomy  06-24-2013  . LUMBAR LAMINECTOMY/DECOMPRESSION MICRODISCECTOMY N/A 01/20/2015   Procedure: MICRO LUMBAR DECOMPRESSION L4-5/L3-4/L2-3;  Surgeon: Johnn Hai, MD;  Location: [facility];  Service: Orthopedics;  Laterality: N/A;  . SHOULDER ARTHROSCOPY  08/22/2012   Procedure: ARTHROSCOPY SHOULDER;  Surgeon: Marin Shutter, MD;  Location: [place];  Service: Orthopedics;  Laterality: Left;  Left shoulder arthroscopic lavage and debridement  . SHOULDER ARTHROSCOPY  09/02/2012   Procedure: ARTHROSCOPY SHOULDER;  Surgeon: Marin Shutter, MD;  Location: [place];  Service: Orthopedics;  Laterality: Left;  Left shoulder arthroscopy irrigation and debridement  . SHOULDER ARTHROSCOPY W/ ROTATOR CUFF REPAIR  07/09/2012   left  . SHOULDER OPEN ROTATOR CUFF REPAIR Right 12/13/2017   Procedure: Right shoulder mini open rotator cuff repair;  Surgeon: Susa Day, MD;  Location: [facility];  Service: Orthopedics;  Laterality: Right;  Interscalene Block  . SKIN LESION EXCISION     Benign  . WISDOM TOOTH EXTRACTION  YRS AGO    There were no vitals filed for this visit.   Subjective Assessment - 01/03/18 1349    Subjective  Pt remains in R shoulder abduction sling 24 hrs/day. States pain has been well controlled with only occasional need for prescription pain meds. MD has only had him doing distal LE exercises (no pendulums) thus far due to alternative type of anchors (developed infection/rejection of standard anchors following L RCR).    Pertinent History  12/13/17 - R shoulder: Mini open rotator cuff repair utilizing JuggerKnot suture anchors of supraspinatus and infraspinatus; Subacromial decompression; Acromioplasty    Patient Stated  Goals  "to get full use of my arm again"    Currently in Pain?  Yes    Pain Score  1  0-1/10    Pain Location  Shoulder    Pain Orientation  Right    Pain Descriptors / Indicators  Aching    Pain Type  Acute pain;Surgical pain    Pain Onset  1 to 4 weeks ago    Pain Frequency  Intermittent    Aggravating Factors   "move wrong"    Pain Relieving Factors  OTC pain meds (ibuprofen - 1 dose per day due to Xarelto); prescription pain meds    Effect of Pain on Daily Activities  limited more due to surgical precautions at present         [facility] PT Assessment - 01/03/18 1342      Assessment    Medical Diagnosis  R shoulder - Mini open rotator cuff repair utilizing JuggerKnot suture anchors of supraspinatus and infraspinatus; Subacromial decompression; Acromioplasty    Referring Provider  Lacie Draft, PA-C for Donnald Garre, MD    Onset Date/Surgical Date  12/13/17    Next MD Visit  01/24/18    Prior Therapy  none for current shoulder, but PT following L RTC repair      Precautions   Required Braces or Orthoses  Sling R shoulder abduction sling - 24 hr/day      Restrictions   Weight Bearing Restrictions  Yes    RUE Weight Bearing  Non weight bearing      Balance Screen   Has the patient fallen in the past 6 months  Yes    How many times?  1    Has the patient had a decrease in activity level because of a fear of falling?   Yes    Is the patient reluctant to leave their home because of a fear of falling?   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    Leisure  outdoors - hunting & fishing; model railroad; walk 1 mile ~3x/wk      Observation/Other Assessments   Focus on Therapeutic Outcomes (FOTO)   Shoulder 19% (81% limitation); predicted 65% (35% limitation) in 23 visits      Posture/Postural Control   Posture/Postural Control  Postural limitations    Postural Limitations  Forward head;Rounded Shoulders      ROM / Strength   AROM / PROM / Strength  PROM      PROM   PROM Assessment Site  Shoulder    Right/Left Shoulder  Right    Right Shoulder Flexion  92 Degrees    Right Shoulder ABduction  68 Degrees    Right Shoulder Internal Rotation  69 Degrees    Right Shoulder External Rotation  29 Degrees              Objective measurements completed on examination: See above findings.      [facility] Adult PT Treatment/Exercise - 01/03/18 1342  Manual Therapy   Manual Therapy  Joint mobilization;Passive ROM    Joint Mobilization  R shoulder - gentle distraction with small circles    Passive ROM  R shoulder PROM to pt tolerance               PT Short Term Goals - 01/03/18 1450      PT SHORT TERM GOAL #1   Title  independent with initial HEP    Status  New    Target Date  01/31/18      PT SHORT TERM GOAL #2   Title  R shoulder PROM equivalent to L shoulder AROM    Target Date  02/14/18      PT SHORT TERM GOAL #3   Title  Pt will demonstrate neutral shoulder alignment to promote proper glenohumeral kinematics    Status  New    Target Date  02/14/18        PT Long Term Goals - 01/03/18 1452      PT LONG TERM GOAL #1   Title  Independent with ongoing HEP    Status  New    Target Date  03/29/18      PT LONG TERM GOAL #2   Title  R shoulder AROM equivalent to L shoulder w/o pain    Status  New    Target Date  03/29/18      PT LONG TERM GOAL #3   Title  R shoulder strength >/= 4-/5 to 4/5 for improved function    Status  New    Target Date  03/29/18      PT LONG TERM GOAL #4   Title  Pt will report no limitation in performance of ADLs or light household chores due to R shoulder pain, LOM or weakness    Status  New    Target Date  03/29/18             Plan - 01/03/18 1430    Clinical Impression Statement  Guiseppe is a 57 y/o male who presents to OP PT 21 days s/p R shoulder surgery on 12/13/17 for mini open rotator cuff repair utilizing JuggerKnot suture anchors of supraspinatus and infraspinatus, subacromial decompression and acromioplasty. Pt reports pain has been well controlled, typically only 1/10. Pt demonstrates postural abnormalities, decreased R shoulder A/PROM and strength limiting functional use of R UE.  Pt will benefit from skilled PT to  address deficits listed.    History and Personal Factors relevant to plan of care:  L RCR with post-op complications due to infection/rejection of surgical anchors 2013; lumbar laminectomy 2016    Clinical Presentation  Stable    Clinical Presentation due to:  uncomplicated post-op progression thus far    Clinical Decision Making  Low    Rehab Potential  Good    PT Frequency  2x / week pt requesting 2x/wk d/t insurance deductible    PT Duration  12 weeks    PT Treatment/Interventions  Patient/family education;ADLs/Self Care Home Management;Manual techniques;Passive range of motion;Scar mobilization;Therapeutic exercise;Therapeutic activities;Electrical Stimulation;Cryotherapy;Vasopneumatic Device;Moist Heat;Iontophoresis 4mg /ml Dexamethasone;Dry needling;Taping    PT Next Visit Plan  PROM x 6 wks (until MD f/u on 01/24/18)    Consulted and Agree with Plan of Care  Patient       Patient will benefit from skilled therapeutic intervention in order to improve the following deficits and impairments:  Decreased range of motion, Impaired flexibility, Pain, Impaired UE functional use, Postural dysfunction, Increased muscle spasms, Increased fascial restricitons,  Decreased scar mobility, Decreased activity tolerance  Visit Diagnosis: Stiffness of right shoulder, not elsewhere classified  Acute pain of right shoulder  Muscle weakness (generalized)  Abnormal posture     Problem List Patient Active Problem List   Diagnosis Date Noted  . Complete rotator cuff tear 12/13/2017  . Claudication ([place]) 09/13/2017  . Allergy to pollen 11/06/2016  . Pulmonary embolism, bilateral ([place])   . Lower leg DVT (deep venous thromboembolism), acute ([place])   . Spinal stenosis of lumbar region at multiple levels 01/20/2015  . Screening, ischemic heart disease 01/03/2015  . Hypertriglyceridemia 08/17/2014  . Obesity 08/17/2014  . Type 2 diabetes mellitus without complication ([place]) 56/38/9373  . Hx of  adenomatous colonic polyps 06/03/2014  . Essential hypertension, benign 09/13/2013  . Thrombocytopenia ([place]) 09/13/2013  . HTN (hypertension) 05/13/2013  . Hyperlipidemia   . Testicular hypofunction   . Biceps tendon rupture   . GERD (gastroesophageal reflux disease)   . Colon polyps     Percival Spanish, PT, MPT  01/03/2018, 3:14 PM  [facility] [address]  [place], [place], [address] Phone: [phone]   Fax:  [phone]  Name: TABOR DENHAM MRN: [id number] Date of Birth: 03/28/1961

## 2018-01-06 ENCOUNTER — Other Ambulatory Visit: Payer: Self-pay | Admitting: Family Medicine

## 2018-01-07 ENCOUNTER — Ambulatory Visit: Payer: BLUE CROSS/BLUE SHIELD | Admitting: Physical Therapy

## 2018-01-07 ENCOUNTER — Encounter: Payer: Self-pay | Admitting: Physical Therapy

## 2018-01-07 DIAGNOSIS — M6281 Muscle weakness (generalized): Secondary | ICD-10-CM

## 2018-01-07 DIAGNOSIS — R293 Abnormal posture: Secondary | ICD-10-CM

## 2018-01-07 DIAGNOSIS — M25611 Stiffness of right shoulder, not elsewhere classified: Secondary | ICD-10-CM | POA: Diagnosis not present

## 2018-01-07 DIAGNOSIS — M25511 Pain in right shoulder: Secondary | ICD-10-CM

## 2018-01-07 NOTE — Therapy (Signed)
[facility] [address]  [place], [place], [address] Phone: [phone]   Fax:  [phone]  Physical Therapy Treatment  Patient Details  Name: Ian [name] MRN: [id number] Date of Birth: 1961-07-29 Referring Provider: Lacie Draft, PA-C for Donnald Garre, MD   Encounter Date: 01/07/2018  PT End of Session - 01/07/18 1445    Visit Number  2    Number of Visits  24    Date for PT Re-Evaluation  03/29/18    Authorization Type  BCBS - VL: 22    PT Start Time  5400    PT Stop Time  1540    PT Time Calculation (min)  55 min    Activity Tolerance  Patient tolerated treatment well    Behavior During Therapy  [facility] for tasks assessed/performed       Past Medical History:  Diagnosis Date  . Allergy   . Arthritis   . Biceps tendon rupture    bilateral  . Clostridium difficile infection 2014  . Colon polyps   . Diabetes ([place])    Borderline/Pre-diabetes  . GERD (gastroesophageal reflux disease)   . History of DVT (deep vein thrombosis)   . History of pulmonary embolism   . Hyperlipidemia   . Hypertension   . Internal bleeding hemorrhoids 2012   "might bleed once/month; only when I strain"  . Internal prolapsed hemorrhoids   . Leg cramps   . Testicular hypofunction   . Vertigo   . Wart of face    Right cheek    Past Surgical History:  Procedure Laterality Date  . ANAL FISSURE REPAIR  06/24/2013  . APPENDECTOMY  2011  . COLONOSCOPY W/ BIOPSIES AND POLYPECTOMY  2012  . COLONSCOPY  2015  . EXCISIONAL HEMORRHOIDECTOMY    . FISSURECTOMY    . HEMORRHOID SURGERY  06/24/2013  . internal sphincterotomy  06-24-2013  . LUMBAR LAMINECTOMY/DECOMPRESSION MICRODISCECTOMY N/A 01/20/2015   Procedure: MICRO LUMBAR DECOMPRESSION L4-5/L3-4/L2-3;  Surgeon: Johnn Hai, MD;  Location[facility];  Service: Orthopedics;  Laterality: N/A;  . SHOULDER ARTHROSCOPY  08/22/2012   Procedure: ARTHROSCOPY SHOULDER;  Surgeon: Marin Shutter, MD;  Location: [place];  Service: Orthopedics;  Laterality: Left;  Left shoulder arthroscopic lavage and debridement  . SHOULDER ARTHROSCOPY  09/02/2012   Procedure: ARTHROSCOPY SHOULDER;  Surgeon: Marin Shutter, MD;  Location: [facility];  Service: Orthopedics;  Laterality: Left;  Left shoulder arthroscopy irrigation and debridement  . SHOULDER ARTHROSCOPY W/ ROTATOR CUFF REPAIR  07/09/2012   left  . SHOULDER OPEN ROTATOR CUFF REPAIR Right 12/13/2017   Procedure: Right shoulder mini open rotator cuff repair;  Surgeon: Susa Day, MD;  [facility];  Service: Orthopedics;  Laterality: Right;  Interscalene Block  . SKIN LESION EXCISION     Benign  . WISDOM TOOTH EXTRACTION  YRS AGO    There were no vitals filed for this visit.  Subjective Assessment - 01/07/18 1450    Subjective  Pt noting very mild soreness following initial eval but no lasting pain.    Pertinent History  12/13/17 - R shoulder: Mini open rotator cuff repair utilizing JuggerKnot suture anchors of supraspinatus and infraspinatus; Subacromial decompression; Acromioplasty    Patient Stated Goals  "to get full use of my arm again"    Currently in Pain?  Yes    Pain Score  1     Pain Location  Shoulder    Pain Orientation  Right    Pain Descriptors / Indicators  -- twinge    Pain Type  Acute pain;Surgical pain    Pain Onset  1 to 4 weeks ago    Pain Frequency  Intermittent                      [facility] Adult PT Treatment/Exercise - 01/07/18 1445      Exercises   Exercises  Shoulder      Shoulder Exercises: Seated   Flexion  Right;PROM;10 reps    Flexion Limitations  table slides      Shoulder Exercises: ROM/Strengthening   Pendulum  R shoulder - flex/ext, horiz ABD/ADD, CW/CCW x20 each      Modalities   Modalities  Cryotherapy      Cryotherapy   Number Minutes Cryotherapy  10 Minutes    Cryotherapy Location  Shoulder    Type of Cryotherapy  Ice pack      Manual Therapy   Manual Therapy   Joint mobilization;Passive ROM;Soft tissue mobilization;Myofascial release    Manual therapy comments  pt supine    Joint Mobilization  R shoulder - gentle distraction with small circles, grade I-II inferior glides    Soft tissue mobilization  R pecs & posterior capusule; incisional scar tissue massage    Myofascial Release  TPR - R teres groups & lateral pecs    Passive ROM  R shoulder PROM all directions to pt tolerance - ROM progressing t/o visit             PT Education - 01/07/18 1530    Education provided  Yes    Education Details  Initial HEP - pendulum exercise and shoudler flexion PROM table slides    Person(s) Educated  Patient    Methods  Explanation;Demonstration;Handout;Verbal cues    Comprehension  Verbalized understanding;Returned demonstration;Verbal cues required;Need further instruction       PT Short Term Goals - 01/03/18 1450      PT SHORT TERM GOAL #1   Title  independent with initial HEP    Status  New    Target Date  01/31/18      PT SHORT TERM GOAL #2   Title  R shoulder PROM equivalent to L shoulder AROM    Target Date  02/14/18      PT SHORT TERM GOAL #3   Title  Pt will demonstrate neutral shoulder alignment to promote proper glenohumeral kinematics    Status  New    Target Date  02/14/18        PT Long Term Goals - 01/03/18 1452      PT LONG TERM GOAL #1   Title  Independent with ongoing HEP    Status  New    Target Date  03/29/18      PT LONG TERM GOAL #2   Title  R shoulder AROM equivalent to L shoulder w/o pain    Status  New    Target Date  03/29/18      PT LONG TERM GOAL #3   Title  R shoulder strength >/= 4-/5 to 4/5 for improved function    Status  New    Target Date  03/29/18      PT LONG TERM GOAL #4   Title  Pt will report no limitation in performance of ADLs or light household chores due to R shoulder pain, LOM or weakness    Status  New    Target Date  03/29/18  Plan - 01/07/18 1530    Clinical  Impression Statement  Pt demonstrating good tolerance for joint mobs and PROM, with STM/MFR to address increased muscle tension. ROM gradually progressing t/o treatment visit. Provided instruction in pendulum exercises and shoulder flexion PROM table slides, with HEP instructions provided. Treatment concluded with ice pack to R shoulder to reduce post-exercise soreness/inflammation (pt declining vasopnuematic compression).    Rehab Potential  Good    PT Frequency  2x / week pt requesting 2x/wk d/t insurance deductible    PT Duration  12 weeks    PT Treatment/Interventions  Patient/family education;ADLs/Self Care Home Management;Manual techniques;Passive range of motion;Scar mobilization;Therapeutic exercise;Therapeutic activities;Electrical Stimulation;Cryotherapy;Vasopneumatic Device;Moist Heat;Iontophoresis 4mg /ml Dexamethasone;Dry needling;Taping    PT Next Visit Plan  PROM x 6 wks (until MD f/u on 01/24/18)    Consulted and Agree with Plan of Care  Patient       Patient will benefit from skilled therapeutic intervention in order to improve the following deficits and impairments:  Decreased range of motion, Impaired flexibility, Pain, Impaired UE functional use, Postural dysfunction, Increased muscle spasms, Increased fascial restricitons, Decreased scar mobility, Decreased activity tolerance  Visit Diagnosis: Stiffness of right shoulder, not elsewhere classified  Acute pain of right shoulder  Muscle weakness (generalized)  Abnormal posture     Problem List Patient Active Problem List   Diagnosis Date Noted  . Complete rotator cuff tear 12/13/2017  . Claudication ([place]) 09/13/2017  . Allergy to pollen 11/06/2016  . Pulmonary embolism, bilateral ([place])   . Lower leg DVT (deep venous thromboembolism), acute ([place])   . Spinal stenosis of lumbar region at multiple levels 01/20/2015  . Screening, ischemic heart disease 01/03/2015  . Hypertriglyceridemia 08/17/2014  . Obesity 08/17/2014   . Type 2 diabetes mellitus without complication ([place]) 62/83/1517  . Hx of adenomatous colonic polyps 06/03/2014  . Essential hypertension, benign 09/13/2013  . Thrombocytopenia ([place]) 09/13/2013  . HTN (hypertension) 05/13/2013  . Hyperlipidemia   . Testicular hypofunction   . Biceps tendon rupture   . GERD (gastroesophageal reflux disease)   . Colon polyps     Percival Spanish, PT, MPT 01/07/2018, 7:02 PM  [facility] [address]  [place], [place], [address] Phone: [phone]   Fax:  [phone]  Name: Ian [name] MRN: [id number] Date of Birth: 09/27/1961

## 2018-01-10 ENCOUNTER — Ambulatory Visit: Payer: BLUE CROSS/BLUE SHIELD | Admitting: Physical Therapy

## 2018-01-10 DIAGNOSIS — M6281 Muscle weakness (generalized): Secondary | ICD-10-CM

## 2018-01-10 DIAGNOSIS — M25611 Stiffness of right shoulder, not elsewhere classified: Secondary | ICD-10-CM | POA: Diagnosis not present

## 2018-01-10 DIAGNOSIS — M25511 Pain in right shoulder: Secondary | ICD-10-CM

## 2018-01-10 DIAGNOSIS — R293 Abnormal posture: Secondary | ICD-10-CM

## 2018-01-10 NOTE — Therapy (Signed)
[facility] [address]  [place], [place], [address] Phone: [phone]   Fax:  [phone]  Physical Therapy Treatment  Patient Details  Name: Ian [name] MRN: [id number] Date of Birth: 12/20/1961 Referring Provider: Lacie Draft, PA-C for Donnald Garre, MD   Encounter Date: 01/10/2018  PT End of Session - 01/10/18 1445    Visit Number  3    Number of Visits  24    Date for PT Re-Evaluation  03/29/18    Authorization Type  BCBS - VL: 72    PT Start Time  5573    PT Stop Time  1538    PT Time Calculation (min)  53 min    Activity Tolerance  Patient tolerated treatment well    Behavior During Therapy  [facility] for tasks assessed/performed       Past Medical History:  Diagnosis Date  . Allergy   . Arthritis   . Biceps tendon rupture    bilateral  . Clostridium difficile infection 2014  . Colon polyps   . Diabetes ([place])    Borderline/Pre-diabetes  . GERD (gastroesophageal reflux disease)   . History of DVT (deep vein thrombosis)   . History of pulmonary embolism   . Hyperlipidemia   . Hypertension   . Internal bleeding hemorrhoids 2012   "might bleed once/month; only when I strain"  . Internal prolapsed hemorrhoids   . Leg cramps   . Testicular hypofunction   . Vertigo   . Wart of face    Right cheek    Past Surgical History:  Procedure Laterality Date  . ANAL FISSURE REPAIR  06/24/2013  . APPENDECTOMY  2011  . COLONOSCOPY W/ BIOPSIES AND POLYPECTOMY  2012  . COLONSCOPY  2015  . EXCISIONAL HEMORRHOIDECTOMY    . FISSURECTOMY    . HEMORRHOID SURGERY  06/24/2013  . internal sphincterotomy  06-24-2013  . LUMBAR LAMINECTOMY/DECOMPRESSION MICRODISCECTOMY N/A 01/20/2015   Procedure: MICRO LUMBAR DECOMPRESSION L4-5/L3-4/L2-3;  Surgeon: Johnn Hai, MD;  [facility];  Service: Orthopedics;  Laterality: N/A;  . SHOULDER ARTHROSCOPY  08/22/2012   Procedure: ARTHROSCOPY SHOULDER;  Surgeon: Marin Shutter, MD;  Location: [place];  Service: Orthopedics;  Laterality: Left;  Left shoulder arthroscopic lavage and debridement  . SHOULDER ARTHROSCOPY  09/02/2012   Procedure: ARTHROSCOPY SHOULDER;  Surgeon: Marin Shutter, MD;  Location: [place];  Service: Orthopedics;  Laterality: Left;  Left shoulder arthroscopy irrigation and debridement  . SHOULDER ARTHROSCOPY W/ ROTATOR CUFF REPAIR  07/09/2012   left  . SHOULDER OPEN ROTATOR CUFF REPAIR Right 12/13/2017   Procedure: Right shoulder mini open rotator cuff repair;  Surgeon: Susa Day, MD;  [facility];  Service: Orthopedics;  Laterality: Right;  Interscalene Block  . SKIN LESION EXCISION     Benign  . WISDOM TOOTH EXTRACTION  YRS AGO    There were no vitals filed for this visit.  Subjective Assessment - 01/10/18 1442    Subjective  Pt noting some increased stiffness after trying to sleep part of the night w/o his sling and waking up partially lying on his R side with his arm folded across his belly as if in the sling.    Pertinent History  12/13/17 - R shoulder: Mini open rotator cuff repair utilizing JuggerKnot suture anchors of supraspinatus and infraspinatus; Subacromial decompression; Acromioplasty    Patient Stated Goals  "to get full use of my arm again"    Currently  in Pain?  Yes    Pain Score  1     Pain Location  Shoulder    Pain Orientation  Right    Pain Descriptors / Indicators  Discomfort    Pain Type  Acute pain;Surgical pain                      [facility] Adult PT Treatment/Exercise - 01/10/18 1445      Exercises   Exercises  Shoulder      Modalities   Modalities  Cryotherapy      Cryotherapy   Number Minutes Cryotherapy  10 Minutes    Cryotherapy Location  Shoulder    Type of Cryotherapy  Ice pack      Manual Therapy   Manual Therapy  Joint mobilization;Passive ROM;Soft tissue mobilization;Myofascial release    Manual therapy comments  pt supine    Joint Mobilization  R shoulder - gentle  distraction with small circles, grade II-III inferior glides    Soft tissue mobilization  R pecs & posterior capusule; incisional scar tissue massage    Myofascial Release  TPR - R teres groups & lateral pecs    Passive ROM  R shoulder PROM all directions to pt tolerance - ROM progressing t/o visit               PT Short Term Goals - 01/03/18 1450      PT SHORT TERM GOAL #1   Title  independent with initial HEP    Status  New    Target Date  01/31/18      PT SHORT TERM GOAL #2   Title  R shoulder PROM equivalent to L shoulder AROM    Target Date  02/14/18      PT SHORT TERM GOAL #3   Title  Pt will demonstrate neutral shoulder alignment to promote proper glenohumeral kinematics    Status  New    Target Date  02/14/18        PT Long Term Goals - 01/03/18 1452      PT LONG TERM GOAL #1   Title  Independent with ongoing HEP    Status  New    Target Date  03/29/18      PT LONG TERM GOAL #2   Title  R shoulder AROM equivalent to L shoulder w/o pain    Status  New    Target Date  03/29/18      PT LONG TERM GOAL #3   Title  R shoulder strength >/= 4-/5 to 4/5 for improved function    Status  New    Target Date  03/29/18      PT LONG TERM GOAL #4   Title  Pt will report no limitation in performance of ADLs or light household chores due to R shoulder pain, LOM or weakness    Status  New    Target Date  03/29/18            Plan - 01/10/18 1533    Clinical Impression Statement  Pt reporting increased stiffness/soreness after sleeping for a preiod w/o his sling but feels he is back to his normal now. He denies any concerns with the pendulum exercises at home, therefore focused on manual therapy for soft tissue mobility, joint mobs and PROM with good pt tolerance and improving ROM.    Rehab Potential  Good    PT Frequency  -- pt requesting 2x/wk d/t insurance deductible  PT Duration  --    PT Treatment/Interventions  Patient/family education;ADLs/Self Care  Home Management;Manual techniques;Passive range of motion;Scar mobilization;Therapeutic exercise;Therapeutic activities;Electrical Stimulation;Cryotherapy;Vasopneumatic Device;Moist Heat;Iontophoresis 4mg /ml Dexamethasone;Dry needling;Taping    PT Next Visit Plan  PROM x 6 wks (until MD f/u on 01/24/18)    Consulted and Agree with Plan of Care  Patient       Patient will benefit from skilled therapeutic intervention in order to improve the following deficits and impairments:  Decreased range of motion, Impaired flexibility, Pain, Impaired UE functional use, Postural dysfunction, Increased muscle spasms, Increased fascial restricitons, Decreased scar mobility, Decreased activity tolerance  Visit Diagnosis: Stiffness of right shoulder, not elsewhere classified  Acute pain of right shoulder  Muscle weakness (generalized)  Abnormal posture     Problem List Patient Active Problem List   Diagnosis Date Noted  . Complete rotator cuff tear 12/13/2017  . Claudication ([place]) 09/13/2017  . Allergy to pollen 11/06/2016  . Pulmonary embolism, bilateral ([place])   . Lower leg DVT (deep venous thromboembolism), acute ([place])   . Spinal stenosis of lumbar region at multiple levels 01/20/2015  . Screening, ischemic heart disease 01/03/2015  . Hypertriglyceridemia 08/17/2014  . Obesity 08/17/2014  . Type 2 diabetes mellitus without complication ([place]) 38/46/6599  . Hx of adenomatous colonic polyps 06/03/2014  . Essential hypertension, benign 09/13/2013  . Thrombocytopenia ([place]) 09/13/2013  . HTN (hypertension) 05/13/2013  . Hyperlipidemia   . Testicular hypofunction   . Biceps tendon rupture   . GERD (gastroesophageal reflux disease)   . Colon polyps     Percival Spanish, PT, MPT 01/10/2018, 6:11 PM  [facility] [address]  [place], [place], [address] Phone: [phone]   Fax:  [phone]  Name: Ian [name] MRN:  [id number] Date of Birth: 1961/12/03

## 2018-01-14 ENCOUNTER — Ambulatory Visit: Payer: BLUE CROSS/BLUE SHIELD | Admitting: Physical Therapy

## 2018-01-14 DIAGNOSIS — M25611 Stiffness of right shoulder, not elsewhere classified: Secondary | ICD-10-CM | POA: Diagnosis not present

## 2018-01-14 DIAGNOSIS — M6281 Muscle weakness (generalized): Secondary | ICD-10-CM

## 2018-01-14 DIAGNOSIS — M25511 Pain in right shoulder: Secondary | ICD-10-CM

## 2018-01-14 DIAGNOSIS — R293 Abnormal posture: Secondary | ICD-10-CM

## 2018-01-14 NOTE — Therapy (Signed)
[facility] [address]  [place], [place], [address] Phone: [phone]   Fax:  [phone]  Physical Therapy Treatment  Patient Details  Name: Ian [name] MRN: [id number] Date of Birth: 06/02/1961 Referring Provider: Lacie Draft, PA-C for Donnald Garre, MD   Encounter Date: 01/14/2018  PT End of Session - 01/14/18 1445    Visit Number  4    Number of Visits  24    Date for PT Re-Evaluation  03/29/18    Authorization Type  BCBS - VL: 78    PT Start Time  9528    PT Stop Time  1543    PT Time Calculation (min)  58 min    Activity Tolerance  Patient tolerated treatment well    Behavior During Therapy  [facility] for tasks assessed/performed       Past Medical History:  Diagnosis Date  . Allergy   . Arthritis   . Biceps tendon rupture    bilateral  . Clostridium difficile infection 2014  . Colon polyps   . Diabetes ([place])    Borderline/Pre-diabetes  . GERD (gastroesophageal reflux disease)   . History of DVT (deep vein thrombosis)   . History of pulmonary embolism   . Hyperlipidemia   . Hypertension   . Internal bleeding hemorrhoids 2012   "might bleed once/month; only when I strain"  . Internal prolapsed hemorrhoids   . Leg cramps   . Testicular hypofunction   . Vertigo   . Wart of face    Right cheek    Past Surgical History:  Procedure Laterality Date  . ANAL FISSURE REPAIR  06/24/2013  . APPENDECTOMY  2011  . COLONOSCOPY W/ BIOPSIES AND POLYPECTOMY  2012  . COLONSCOPY  2015  . EXCISIONAL HEMORRHOIDECTOMY    . FISSURECTOMY    . HEMORRHOID SURGERY  06/24/2013  . internal sphincterotomy  06-24-2013  . LUMBAR LAMINECTOMY/DECOMPRESSION MICRODISCECTOMY N/A 01/20/2015   Procedure: MICRO LUMBAR DECOMPRESSION L4-5/L3-4/L2-3;  Surgeon: Johnn Hai, MD;  [facility];  Service: Orthopedics;  Laterality: N/A;  . SHOULDER ARTHROSCOPY  08/22/2012   Procedure: ARTHROSCOPY SHOULDER;  Surgeon: Marin Shutter, MD;  Location: [place];  Service: Orthopedics;  Laterality: Left;  Left shoulder arthroscopic lavage and debridement  . SHOULDER ARTHROSCOPY  09/02/2012   Procedure: ARTHROSCOPY SHOULDER;  Surgeon: Marin Shutter, MD;  Location: [place];  Service: Orthopedics;  Laterality: Left;  Left shoulder arthroscopy irrigation and debridement  . SHOULDER ARTHROSCOPY W/ ROTATOR CUFF REPAIR  07/09/2012   left  . SHOULDER OPEN ROTATOR CUFF REPAIR Right 12/13/2017   Procedure: Right shoulder mini open rotator cuff repair;  Surgeon: Susa Day, MD;  [facility];  Service: Orthopedics;  Laterality: Right;  Interscalene Block  . SKIN LESION EXCISION     Benign  . WISDOM TOOTH EXTRACTION  YRS AGO    There were no vitals filed for this visit.  Subjective Assessment - 01/14/18 1449    Subjective  Pt noting some increased stiffness after trying to sleep part of the night w/o his sling and waking up partially lying on his R side with his arm folded across his belly as if in the sling.    Pertinent History  12/13/17 - R shoulder: Mini open rotator cuff repair utilizing JuggerKnot suture anchors of supraspinatus and infraspinatus; Subacromial decompression; Acromioplasty    Patient Stated Goals  "to get full use of my arm again"    Currently  in Pain?  Yes    Pain Score  1     Pain Location  Shoulder    Pain Orientation  Right    Pain Descriptors / Indicators  Discomfort;Tightness    Pain Type  Acute pain;Surgical pain         [facility] PT Assessment - 01/14/18 1445      PROM   Right Shoulder Flexion  145 Degrees    Right Shoulder ABduction  134 Degrees    Right Shoulder Internal Rotation  82 Degrees    Right Shoulder External Rotation  60 Degrees                  [facility] Adult PT Treatment/Exercise - 01/14/18 1445      Exercises   Exercises  Shoulder      Shoulder Exercises: Seated   Retraction  Both;10 reps    Retraction Limitations  5" hold    Flexion  Right;PROM;10 reps     Flexion Limitations  table slides    Abduction  Right;10 reps;PROM    ABduction Limitations  scaption table slides      Modalities   Modalities  Cryotherapy      Cryotherapy   Number Minutes Cryotherapy  10 Minutes    Cryotherapy Location  Shoulder    Type of Cryotherapy  Ice pack      Manual Therapy   Manual Therapy  Joint mobilization;Passive ROM;Soft tissue mobilization;Myofascial release    Manual therapy comments  pt supine    Joint Mobilization  R shoulder - gentle distraction with small circles, grade II-III inferior glides    Soft tissue mobilization  R pecs & posterior capusule; incisional scar tissue massage    Myofascial Release  TPR - R teres groups & lateral pecs    Passive ROM  R shoulder PROM all directions to pt tolerance - ROM progressing t/o visit             PT Education - 01/14/18 1528    Education provided  Yes    Education Details  HEP addition - scaption PROM table slides & scapular retraction    Person(s) Educated  Patient    Methods  Explanation;Demonstration    Comprehension  Verbalized understanding;Returned demonstration       PT Short Term Goals - 01/03/18 1450      PT SHORT TERM GOAL #1   Title  independent with initial HEP    Status  New    Target Date  01/31/18      PT SHORT TERM GOAL #2   Title  R shoulder PROM equivalent to L shoulder AROM    Target Date  02/14/18      PT SHORT TERM GOAL #3   Title  Pt will demonstrate neutral shoulder alignment to promote proper glenohumeral kinematics    Status  New    Target Date  02/14/18        PT Long Term Goals - 01/03/18 1452      PT LONG TERM GOAL #1   Title  Independent with ongoing HEP    Status  New    Target Date  03/29/18      PT LONG TERM GOAL #2   Title  R shoulder AROM equivalent to L shoulder w/o pain    Status  New    Target Date  03/29/18      PT LONG TERM GOAL #3   Title  R shoulder strength >/= 4-/5 to 4/5 for  improved function    Status  New    Target Date   03/29/18      PT LONG TERM GOAL #4   Title  Pt will report no limitation in performance of ADLs or light household chores due to R shoulder pain, LOM or weakness    Status  New    Target Date  03/29/18            Plan - 01/14/18 1537    Clinical Impression Statement  Pain remains well controlled, with pt reporting primarily more tightness/stiffness than anything else. PROM progressing as expected with pt reporting minimal pain. Added scaption table slides and scapular retraction to HEP.    Rehab Potential  Good    PT Frequency  -- pt requesting 2x/wk d/t insurance deductible    PT Treatment/Interventions  Patient/family education;ADLs/Self Care Home Management;Manual techniques;Passive range of motion;Scar mobilization;Therapeutic exercise;Therapeutic activities;Electrical Stimulation;Cryotherapy;Vasopneumatic Device;Moist Heat;Iontophoresis 4mg /ml Dexamethasone;Dry needling;Taping    PT Next Visit Plan  PROM x 6 wks (until MD f/u on 01/24/18)    Consulted and Agree with Plan of Care  Patient       Patient will benefit from skilled therapeutic intervention in order to improve the following deficits and impairments:  Decreased range of motion, Impaired flexibility, Pain, Impaired UE functional use, Postural dysfunction, Increased muscle spasms, Increased fascial restricitons, Decreased scar mobility, Decreased activity tolerance  Visit Diagnosis: Stiffness of right shoulder, not elsewhere classified  Acute pain of right shoulder  Muscle weakness (generalized)  Abnormal posture     Problem List Patient Active Problem List   Diagnosis Date Noted  . Complete rotator cuff tear 12/13/2017  . Claudication ([place]) 09/13/2017  . Allergy to pollen 11/06/2016  . Pulmonary embolism, bilateral ([place])   . Lower leg DVT (deep venous thromboembolism), acute ([place])   . Spinal stenosis of lumbar region at multiple levels 01/20/2015  . Screening, ischemic heart disease 01/03/2015  .  Hypertriglyceridemia 08/17/2014  . Obesity 08/17/2014  . Type 2 diabetes mellitus without complication ([place]) 22/63/3354  . Hx of adenomatous colonic polyps 06/03/2014  . Essential hypertension, benign 09/13/2013  . Thrombocytopenia ([place]) 09/13/2013  . HTN (hypertension) 05/13/2013  . Hyperlipidemia   . Testicular hypofunction   . Biceps tendon rupture   . GERD (gastroesophageal reflux disease)   . Colon polyps     Percival Spanish, PT, MPT 01/14/2018, 3:57 PM  [facility] [address]  [place], [place], [address] Phone: [phone]   Fax:  [phone]  Name: Ian [name] MRN: [id number] Date of Birth: 15-Jun-1961

## 2018-01-17 ENCOUNTER — Encounter: Payer: Self-pay | Admitting: Physical Therapy

## 2018-01-17 ENCOUNTER — Ambulatory Visit: Payer: BLUE CROSS/BLUE SHIELD | Admitting: Physical Therapy

## 2018-01-17 ENCOUNTER — Encounter: Payer: BLUE CROSS/BLUE SHIELD | Admitting: Physical Therapy

## 2018-01-17 DIAGNOSIS — M25611 Stiffness of right shoulder, not elsewhere classified: Secondary | ICD-10-CM | POA: Diagnosis not present

## 2018-01-17 DIAGNOSIS — M6281 Muscle weakness (generalized): Secondary | ICD-10-CM

## 2018-01-17 DIAGNOSIS — M25511 Pain in right shoulder: Secondary | ICD-10-CM

## 2018-01-17 DIAGNOSIS — R293 Abnormal posture: Secondary | ICD-10-CM

## 2018-01-17 NOTE — Therapy (Signed)
[facility] [address]  [place], [place], [address] Phone: [phone]   Fax:  [phone]  Physical Therapy Treatment  Patient Details  Name: Ian [name] MRN: [id number] Date of Birth: 15-Jan-1961 Referring Provider: Lacie Draft, PA-C for Donnald Garre, MD   Encounter Date: 01/17/2018  PT End of Session - 01/17/18 1435    Visit Number  5    Number of Visits  24    Date for PT Re-Evaluation  03/29/18    Authorization Type  BCBS - VL: 67    PT Start Time  0932    PT Stop Time  6712    PT Time Calculation (min)  56 min    Activity Tolerance  Patient tolerated treatment well    Behavior During Therapy  [facility] for tasks assessed/performed       Past Medical History:  Diagnosis Date  . Allergy   . Arthritis   . Biceps tendon rupture    bilateral  . Clostridium difficile infection 2014  . Colon polyps   . Diabetes ([place])    Borderline/Pre-diabetes  . GERD (gastroesophageal reflux disease)   . History of DVT (deep vein thrombosis)   . History of pulmonary embolism   . Hyperlipidemia   . Hypertension   . Internal bleeding hemorrhoids 2012   "might bleed once/month; only when I strain"  . Internal prolapsed hemorrhoids   . Leg cramps   . Testicular hypofunction   . Vertigo   . Wart of face    Right cheek    Past Surgical History:  Procedure Laterality Date  . ANAL FISSURE REPAIR  06/24/2013  . APPENDECTOMY  2011  . COLONOSCOPY W/ BIOPSIES AND POLYPECTOMY  2012  . COLONSCOPY  2015  . EXCISIONAL HEMORRHOIDECTOMY    . FISSURECTOMY    . HEMORRHOID SURGERY  06/24/2013  . internal sphincterotomy  06-24-2013  . LUMBAR LAMINECTOMY/DECOMPRESSION MICRODISCECTOMY N/A 01/20/2015   Procedure: MICRO LUMBAR DECOMPRESSION L4-5/L3-4/L2-3;  Surgeon: Johnn Hai, MD;  [facility];  Service: Orthopedics;  Laterality: N/A;  . SHOULDER ARTHROSCOPY  08/22/2012   Procedure: ARTHROSCOPY SHOULDER;  Surgeon: Marin Shutter, MD;  Location: [place];  Service: Orthopedics;  Laterality: Left;  Left shoulder arthroscopic lavage and debridement  . SHOULDER ARTHROSCOPY  09/02/2012   Procedure: ARTHROSCOPY SHOULDER;  Surgeon: Marin Shutter, MD;  Location: [place];  Service: Orthopedics;  Laterality: Left;  Left shoulder arthroscopy irrigation and debridement  . SHOULDER ARTHROSCOPY W/ ROTATOR CUFF REPAIR  07/09/2012   left  . SHOULDER OPEN ROTATOR CUFF REPAIR Right 12/13/2017   Procedure: Right shoulder mini open rotator cuff repair;  Surgeon: Susa Day, MD;  [facility];  Service: Orthopedics;  Laterality: Right;  Interscalene Block  . SKIN LESION EXCISION     Benign  . WISDOM TOOTH EXTRACTION  YRS AGO    There were no vitals filed for this visit.  Subjective Assessment - 01/17/18 1437    Pertinent History  12/13/17 - R shoulder: Mini open rotator cuff repair utilizing JuggerKnot suture anchors of supraspinatus and infraspinatus; Subacromial decompression; Acromioplasty  (Pended)     Patient Stated Goals  "to get full use of my arm again"  (Pended)     Currently in Pain?  No/denies  (Pended)     Pain Score  0-No pain  (Pended)  [facility] Adult PT Treatment/Exercise - 01/17/18 1435      Exercises   Exercises  Shoulder      Modalities   Modalities  Cryotherapy      Cryotherapy   Number Minutes Cryotherapy  10 Minutes    Cryotherapy Location  Shoulder    Type of Cryotherapy  Ice pack      Manual Therapy   Manual Therapy  Joint mobilization;Passive ROM;Soft tissue mobilization;Myofascial release    Manual therapy comments  pt supine    Joint Mobilization  R shoulder - gentle distraction with small circles, grade II-III A/P & inferior glides    Soft tissue mobilization  R pecs & posterior capusule; incisional scar tissue massage    Myofascial Release  TPR - R teres groups & lateral pecs    Passive ROM  R shoulder PROM all directions to pt tolerance - ROM  progressing t/o visit               PT Short Term Goals - 01/03/18 1450      PT SHORT TERM GOAL #1   Title  independent with initial HEP    Status  New    Target Date  01/31/18      PT SHORT TERM GOAL #2   Title  R shoulder PROM equivalent to L shoulder AROM    Target Date  02/14/18      PT SHORT TERM GOAL #3   Title  Pt will demonstrate neutral shoulder alignment to promote proper glenohumeral kinematics    Status  New    Target Date  02/14/18        PT Long Term Goals - 01/03/18 1452      PT LONG TERM GOAL #1   Title  Independent with ongoing HEP    Status  New    Target Date  03/29/18      PT LONG TERM GOAL #2   Title  R shoulder AROM equivalent to L shoulder w/o pain    Status  New    Target Date  03/29/18      PT LONG TERM GOAL #3   Title  R shoulder strength >/= 4-/5 to 4/5 for improved function    Status  New    Target Date  03/29/18      PT LONG TERM GOAL #4   Title  Pt will report no limitation in performance of ADLs or light household chores due to R shoulder pain, LOM or weakness    Status  New    Target Date  03/29/18            Plan - 01/17/18 1521    Clinical Impression Statement  Pt reporting brief sharp pain at times with initial reps of table slides at home, but pain subsides as muscles relax and flexibility improves. No issues noted with scapular retraction. Pt continues to demonstrate good tolerance for PROM with ROM gradually improving t/o session. Increased muscle tension persists in pecs and posterior capsule with some ttp in teres group, but responds well to STM/TPR.    Rehab Potential  Good    PT Frequency  -- pt requesting 2x/wk d/t insurance deductible    PT Treatment/Interventions  Patient/family education;ADLs/Self Care Home Management;Manual techniques;Passive range of motion;Scar mobilization;Therapeutic exercise;Therapeutic activities;Electrical Stimulation;Cryotherapy;Vasopneumatic Device;Moist Heat;Iontophoresis 4mg /ml  Dexamethasone;Dry needling;Taping    PT Next Visit Plan  PROM x 6 wks (until MD f/u on 01/24/18)    Consulted and Agree with Plan of Care  Patient       Patient will benefit from skilled therapeutic intervention in order to improve the following deficits and impairments:  Decreased range of motion, Impaired flexibility, Pain, Impaired UE functional use, Postural dysfunction, Increased muscle spasms, Increased fascial restricitons, Decreased scar mobility, Decreased activity tolerance  Visit Diagnosis: Stiffness of right shoulder, not elsewhere classified  Acute pain of right shoulder  Muscle weakness (generalized)  Abnormal posture     Problem List Patient Active Problem List   Diagnosis Date Noted  . Complete rotator cuff tear 12/13/2017  . Claudication ([place]) 09/13/2017  . Allergy to pollen 11/06/2016  . Pulmonary embolism, bilateral ([place])   . Lower leg DVT (deep venous thromboembolism), acute ([place])   . Spinal stenosis of lumbar region at multiple levels 01/20/2015  . Screening, ischemic heart disease 01/03/2015  . Hypertriglyceridemia 08/17/2014  . Obesity 08/17/2014  . Type 2 diabetes mellitus without complication ([place]) 87/57/9728  . Hx of adenomatous colonic polyps 06/03/2014  . Essential hypertension, benign 09/13/2013  . Thrombocytopenia ([place]) 09/13/2013  . HTN (hypertension) 05/13/2013  . Hyperlipidemia   . Testicular hypofunction   . Biceps tendon rupture   . GERD (gastroesophageal reflux disease)   . Colon polyps     Percival Spanish, PT, MPT 01/17/2018, 4:18 PM  [facility] [address]  [place], [place], [address] Phone: [phone]   Fax:  [phone]  Name: Ian [name] MRN: [id number] Date of Birth: [birth year]

## 2018-01-21 ENCOUNTER — Ambulatory Visit: Payer: BLUE CROSS/BLUE SHIELD | Admitting: Physical Therapy

## 2018-01-21 DIAGNOSIS — M25611 Stiffness of right shoulder, not elsewhere classified: Secondary | ICD-10-CM

## 2018-01-21 DIAGNOSIS — R293 Abnormal posture: Secondary | ICD-10-CM

## 2018-01-21 DIAGNOSIS — M6281 Muscle weakness (generalized): Secondary | ICD-10-CM

## 2018-01-21 DIAGNOSIS — M25511 Pain in right shoulder: Secondary | ICD-10-CM

## 2018-01-21 NOTE — Therapy (Signed)
[facility] [address]  [place], [place], [address] Phone: [phone]   Fax:  [phone]  Physical Therapy Treatment  Patient Details  Name: Ian [name] MRN: [id number] Date of Birth: [birth year] Referring Provider: Lacie Draft, PA-C for Donnald Garre, MD   Encounter Date: 01/21/2018  PT End of Session - 01/21/18 1446    Visit Number  6    Number of Visits  24    Date for PT Re-Evaluation  03/29/18    Authorization Type  BC[employer] - VL: 16    PT Start Time  1446    PT Stop Time  1543    PT Time Calculation (min)  57 min    Activity Tolerance  Patient tolerated treatment well    Behavior During Therapy  [facility] for tasks assessed/performed       Past Medical History:  Diagnosis Date  . Allergy   . Arthritis   . Biceps tendon rupture    bilateral  . Clostridium difficile infection 2014  . Colon polyps   . Diabetes ([place])    Borderline/Pre-diabetes  . GERD (gastroesophageal reflux disease)   . History of DVT (deep vein thrombosis)   . History of pulmonary embolism   . Hyperlipidemia   . Hypertension   . Internal bleeding hemorrhoids 2012   "might bleed once/month; only when I strain"  . Internal prolapsed hemorrhoids   . Leg cramps   . Testicular hypofunction   . Vertigo   . Wart of face    Right cheek    Past Surgical History:  Procedure Laterality Date  . ANAL FISSURE REPAIR  06/24/2013  . APPENDECTOMY  2011  . COLONOSCOPY W/ BIOPSIES AND POLYPECTOMY  2012  . COLONSCOPY  2015  . EXCISIONAL HEMORRHOIDECTOMY    . FISSURECTOMY    . HEMORRHOID SURGERY  06/24/2013  . internal sphincterotomy  06-24-2013  . LUMBAR LAMINECTOMY/DECOMPRESSION MICRODISCECTOMY N/A 01/20/2015   Procedure: MICRO LUMBAR DECOMPRESSION L4-5/L3-4/L2-3;  Surgeon: Johnn Hai, MD;  Location: [facility];  Service: Orthopedics;  Laterality: N/A;  . SHOULDER ARTHROSCOPY  08/22/2012   Procedure: ARTHROSCOPY SHOULDER;  Surgeon: Marin Shutter, MD;  Location: [place];  Service: Orthopedics;  Laterality: Left;  Left shoulder arthroscopic lavage and debridement  . SHOULDER ARTHROSCOPY  09/02/2012   Procedure: ARTHROSCOPY SHOULDER;  Surgeon: Marin Shutter, MD;  Location: [place];  Service: Orthopedics;  Laterality: Left;  Left shoulder arthroscopy irrigation and debridement  . SHOULDER ARTHROSCOPY W/ ROTATOR CUFF REPAIR  07/09/2012   left  . SHOULDER OPEN ROTATOR CUFF REPAIR Right 12/13/2017   Procedure: Right shoulder mini open rotator cuff repair;  Surgeon: Susa Day, MD;  Location: [facility];  Service: Orthopedics;  Laterality: Right;  Interscalene Block  . SKIN LESION EXCISION     Benign  . WISDOM TOOTH EXTRACTION  YRS AGO    There were no vitals filed for this visit.  Subjective Assessment - 01/21/18 1448    Subjective  Pt noting increased soreness in R anterior shoulder with subtle shoulder movements such as shrugging in/out of clothing since last week.    Pertinent History  12/13/17 - R shoulder: Mini open rotator cuff repair utilizing JuggerKnot suture anchors of supraspinatus and infraspinatus; Subacromial decompression; Acromioplasty    Patient Stated Goals  "to get full use of my arm again"    Currently in Pain?  No/denies    Pain Score  0-No pain  [facility] PT Assessment - 01/21/18 1446      Assessment   Medical Diagnosis  R shoulder - Mini open rotator cuff repair utilizing JuggerKnot suture anchors of supraspinatus and infraspinatus; Subacromial decompression; Acromioplasty    Referring Provider  Lacie Draft, PA-C for Donnald Garre, MD    Onset Date/Surgical Date  12/13/17    Next MD Visit  01/24/18      PROM   Right Shoulder Flexion  145 Degrees    Right Shoulder ABduction  143 Degrees    Right Shoulder Internal Rotation  86 Degrees    Right Shoulder External Rotation  65 Degrees                  [facility] Adult PT Treatment/Exercise - 01/21/18 1446      Exercises   Exercises  Shoulder       Modalities   Modalities  Cryotherapy      Cryotherapy   Number Minutes Cryotherapy  10 Minutes    Cryotherapy Location  Shoulder    Type of Cryotherapy  Ice pack      Manual Therapy   Manual Therapy  Joint mobilization;Passive ROM;Soft tissue mobilization;Myofascial release    Manual therapy comments  pt supine    Joint Mobilization  R shoulder - gentle distraction with small circles, grade II-III A/P & inferior glides    Soft tissue mobilization  R biceps, pecs & posterior capusule; incisional scar tissue massage    Myofascial Release  TPR - R biceps, teres groups & lateral pecs    Passive ROM  R shoulder PROM all directions to pt tolerance               PT Short Term Goals - 01/03/18 1450      PT SHORT TERM GOAL #1   Title  independent with initial HEP    Status  New    Target Date  01/31/18      PT SHORT TERM GOAL #2   Title  R shoulder PROM equivalent to L shoulder AROM    Target Date  02/14/18      PT SHORT TERM GOAL #3   Title  Pt will demonstrate neutral shoulder alignment to promote proper glenohumeral kinematics    Status  New    Target Date  02/14/18        PT Long Term Goals - 01/03/18 1452      PT LONG TERM GOAL #1   Title  Independent with ongoing HEP    Status  New    Target Date  03/29/18      PT LONG TERM GOAL #2   Title  R shoulder AROM equivalent to L shoulder w/o pain    Status  New    Target Date  03/29/18      PT LONG TERM GOAL #3   Title  R shoulder strength >/= 4-/5 to 4/5 for improved function    Status  New    Target Date  03/29/18      PT LONG TERM GOAL #4   Title  Pt will report no limitation in performance of ADLs or light household chores due to R shoulder pain, LOM or weakness    Status  New    Target Date  03/29/18            Plan - 01/21/18 1451    Clinical Impression Statement  Ian [name] progressing well ith PT thus far. Pain remains well controlled with no pain typically  reported at rest, and only mild pain  with movements such as placing arm into sleeve or  expected discomfort during PROM associated with stretch at end range of motion. PROM progressing well with pt approaching full PROM, limited primarily by mucscular tightness in pecs and posterior capsule. Awaitinng MD clearance to progress to AAROM.    Rehab Potential  Good    PT Frequency  -- pt requesting 2x/wk d/t insurance deductible    PT Treatment/Interventions  Patient/family education;ADLs/Self Care Home Management;Manual techniques;Passive range of motion;Scar mobilization;Therapeutic exercise;Therapeutic activities;Electrical Stimulation;Cryotherapy;Vasopneumatic Device;Moist Heat;Iontophoresis 4mg /ml Dexamethasone;Dry needling;Taping    PT Next Visit Plan  PROM x 6 wks (until MD f/u on 01/24/18)    Consulted and Agree with Plan of Care  Patient       Patient will benefit from skilled therapeutic intervention in order to improve the following deficits and impairments:  Decreased range of motion, Impaired flexibility, Pain, Impaired UE functional use, Postural dysfunction, Increased muscle spasms, Increased fascial restricitons, Decreased scar mobility, Decreased activity tolerance  Visit Diagnosis: Stiffness of right shoulder, not elsewhere classified  Acute pain of right shoulder  Muscle weakness (generalized)  Abnormal posture     Problem List Patient Active Problem List   Diagnosis Date Noted  . Complete rotator cuff tear 12/13/2017  . Claudication ([place]) 09/13/2017  . Allergy to pollen 11/06/2016  . Pulmonary embolism, bilateral ([place])   . Lower leg DVT (deep venous thromboembolism), acute ([place])   . Spinal stenosis of lumbar region at multiple levels 01/20/2015  . Screening, ischemic heart disease 01/03/2015  . Hypertriglyceridemia 08/17/2014  . Obesity 08/17/2014  . Type 2 diabetes mellitus without complication ([place]) 42/68/3419  . Hx of adenomatous colonic polyps 06/03/2014  . Essential hypertension, benign 09/13/2013   . Thrombocytopenia ([place]) 09/13/2013  . HTN (hypertension) 05/13/2013  . Hyperlipidemia   . Testicular hypofunction   . Biceps tendon rupture   . GERD (gastroesophageal reflux disease)   . Colon polyps     Percival Spanish, PT, MPT 01/21/2018, 3:52 PM  [facility] [address]  [place], [place], [address] Phone: [phone]   Fax:  [phone]  Name: Ian [name] MRN: [id number] Date of Birth: 1961-11-25

## 2018-01-24 ENCOUNTER — Ambulatory Visit: Payer: BLUE CROSS/BLUE SHIELD | Admitting: Physical Therapy

## 2018-01-25 ENCOUNTER — Encounter: Payer: Self-pay | Admitting: Physical Therapy

## 2018-01-25 ENCOUNTER — Ambulatory Visit: Payer: BLUE CROSS/BLUE SHIELD | Attending: Orthopedic Surgery | Admitting: Physical Therapy

## 2018-01-25 DIAGNOSIS — M6281 Muscle weakness (generalized): Secondary | ICD-10-CM | POA: Insufficient documentation

## 2018-01-25 DIAGNOSIS — M25511 Pain in right shoulder: Secondary | ICD-10-CM | POA: Diagnosis present

## 2018-01-25 DIAGNOSIS — M25611 Stiffness of right shoulder, not elsewhere classified: Secondary | ICD-10-CM | POA: Diagnosis not present

## 2018-01-25 DIAGNOSIS — R293 Abnormal posture: Secondary | ICD-10-CM | POA: Diagnosis present

## 2018-01-25 NOTE — Therapy (Signed)
[facility] [address]  [place], [place], [address] Phone: [phone]   Fax:  [phone]  Physical Therapy Treatment  Patient Details  Name: Ian [name] MRN: [id number] Date of Birth: [birth year] Referring Provider: Lacie Draft, PA-C for Donnald Garre, MD   Encounter Date: 01/25/2018  PT End of Session - 01/25/18 1102    Visit Number  7    Number of Visits  24    Date for PT Re-Evaluation  03/29/18    Authorization Type  BC[employer] - VL: 56    PT Start Time  1102    PT Stop Time  3500    PT Time Calculation (min)  54 min    Activity Tolerance  Patient tolerated treatment well    Behavior During Therapy  [facility] for tasks assessed/performed       Past Medical History:  Diagnosis Date  . Allergy   . Arthritis   . Biceps tendon rupture    bilateral  . Clostridium difficile infection 2014  . Colon polyps   . Diabetes ([place])    Borderline/Pre-diabetes  . GERD (gastroesophageal reflux disease)   . History of DVT (deep vein thrombosis)   . History of pulmonary embolism   . Hyperlipidemia   . Hypertension   . Internal bleeding hemorrhoids 2012   "might bleed once/month; only when I strain"  . Internal prolapsed hemorrhoids   . Leg cramps   . Testicular hypofunction   . Vertigo   . Wart of face    Right cheek    Past Surgical History:  Procedure Laterality Date  . ANAL FISSURE REPAIR  06/24/2013  . APPENDECTOMY  2011  . COLONOSCOPY W/ BIOPSIES AND POLYPECTOMY  2012  . COLONSCOPY  2015  . EXCISIONAL HEMORRHOIDECTOMY    . FISSURECTOMY    . HEMORRHOID SURGERY  06/24/2013  . internal sphincterotomy  06-24-2013  . LUMBAR LAMINECTOMY/DECOMPRESSION MICRODISCECTOMY N/A 01/20/2015   Procedure: MICRO LUMBAR DECOMPRESSION L4-5/L3-4/L2-3;  Surgeon: Johnn Hai, MD;  Location: [facility];  Service: Orthopedics;  Laterality: N/A;  . SHOULDER ARTHROSCOPY  08/22/2012   Procedure: ARTHROSCOPY SHOULDER;  Surgeon: Marin Shutter, MD;  Location: [place];  Service: Orthopedics;  Laterality: Left;  Left shoulder arthroscopic lavage and debridement  . SHOULDER ARTHROSCOPY  09/02/2012   Procedure: ARTHROSCOPY SHOULDER;  Surgeon: Marin Shutter, MD;  Location: [place];  Service: Orthopedics;  Laterality: Left;  Left shoulder arthroscopy irrigation and debridement  . SHOULDER ARTHROSCOPY W/ ROTATOR CUFF REPAIR  07/09/2012   left  . SHOULDER OPEN ROTATOR CUFF REPAIR Right 12/13/2017   Procedure: Right shoulder mini open rotator cuff repair;  Surgeon: Susa Day, MD;  Location: [facility];  Service: Orthopedics;  Laterality: Right;  Interscalene Block  . SKIN LESION EXCISION     Benign  . WISDOM TOOTH EXTRACTION  YRS AGO    There were no vitals filed for this visit.  Subjective Assessment - 01/25/18 1104    Subjective  Pt reporting MD pleased with his progress thus far and cleared him to begin AAROM for the next 4 weeks    Pertinent History  12/13/17 - R shoulder: Mini open rotator cuff repair utilizing JuggerKnot suture anchors of supraspinatus and infraspinatus; Subacromial decompression; Acromioplasty    Patient Stated Goals  "to get full use of my arm again"    Currently in Pain?  No/denies    Pain Score  0-No pain  [facility] PT Assessment - 01/25/18 1102      Assessment   Next MD Visit  ~03/07/18                  [facility] Adult PT Treatment/Exercise - 01/25/18 1102      Exercises   Exercises  Shoulder      Shoulder Exercises: Supine   External Rotation  Right;AAROM;15 reps    External Rotation Limitations  wand at ~50-60 dg ABD    Flexion  Right;AAROM;15 reps    Flexion Limitations  wand      Shoulder Exercises: Standing   Internal Rotation  Right;AAROM;10 reps    Internal Rotation Limitations  wand behind back      Shoulder Exercises: Pulleys   Flexion  2 minutes      Modalities   Modalities  Vasopneumatic      Vasopneumatic   Number Minutes Vasopneumatic   10 minutes     Vasopnuematic Location   Shoulder    Vasopneumatic Pressure  Low    Vasopneumatic Temperature   lowest      Manual Therapy   Manual Therapy  Joint mobilization;Passive ROM;Soft tissue mobilization;Myofascial release    Manual therapy comments  pt supine    Joint Mobilization  R shoulder - gentle distraction with small circles, grade II-III A/P & inferior glides    Soft tissue mobilization  R biceps, pecs & posterior capusule; incisional scar tissue massage    Myofascial Release  TPR - R biceps & teres groups    Passive ROM  R shoulder PROM all directions to pt tolerance             PT Education - 01/25/18 1154    Education provided  Yes    Education Details  HEP update - wand exercises    Person(s) Educated  Patient    Methods  Explanation;Demonstration;Handout    Comprehension  Verbalized understanding;Returned demonstration       PT Short Term Goals - 01/03/18 1450      PT SHORT TERM GOAL #1   Title  independent with initial HEP    Status  New    Target Date  01/31/18      PT SHORT TERM GOAL #2   Title  R shoulder PROM equivalent to L shoulder AROM    Target Date  02/14/18      PT SHORT TERM GOAL #3   Title  Pt will demonstrate neutral shoulder alignment to promote proper glenohumeral kinematics    Status  New    Target Date  02/14/18        PT Long Term Goals - 01/03/18 1452      PT LONG TERM GOAL #1   Title  Independent with ongoing HEP    Status  New    Target Date  03/29/18      PT LONG TERM GOAL #2   Title  R shoulder AROM equivalent to L shoulder w/o pain    Status  New    Target Date  03/29/18      PT LONG TERM GOAL #3   Title  R shoulder strength >/= 4-/5 to 4/5 for improved function    Status  New    Target Date  03/29/18      PT LONG TERM GOAL #4   Title  Pt will report no limitation in performance of ADLs or light household chores due to R shoulder pain, LOM or weakness    Status  New    Target Date  03/29/18            Plan -  01/25/18 1110    Clinical Impression Statement  Ian [name] reporting MD pleased with his progress, clearing him to initiate AAROM and resume driving. Initiated flexion pulleys and provided instruction in wand AAROM exercises, but limited tolerance for abduction with wand, therefore deferred this from HEP. Treatment concluded with vasopneumatic compression to minimize post-exercise soreness and inflammation.    Rehab Potential  Good    PT Frequency  -- pt requesting 2x/wk d/t insurance deductible    PT Treatment/Interventions  Patient/family education;ADLs/Self Care Home Management;Manual techniques;Passive range of motion;Scar mobilization;Therapeutic exercise;Therapeutic activities;Electrical Stimulation;Cryotherapy;Vasopneumatic Device;Moist Heat;Iontophoresis 4mg /ml Dexamethasone;Dry needling;Taping    PT Next Visit Plan  AAROM x 4 wks (as of 01/24/18), then AROM/strengthening    Consulted and Agree with Plan of Care  Patient       Patient will benefit from skilled therapeutic intervention in order to improve the following deficits and impairments:  Decreased range of motion, Impaired flexibility, Pain, Impaired UE functional use, Postural dysfunction, Increased muscle spasms, Increased fascial restricitons, Decreased scar mobility, Decreased activity tolerance  Visit Diagnosis: Stiffness of right shoulder, not elsewhere classified  Acute pain of right shoulder  Muscle weakness (generalized)  Abnormal posture     Problem List Patient Active Problem List   Diagnosis Date Noted  . Complete rotator cuff tear 12/13/2017  . Claudication ([place]) 09/13/2017  . Allergy to pollen 11/06/2016  . Pulmonary embolism, bilateral ([place])   . Lower leg DVT (deep venous thromboembolism), acute ([place])   . Spinal stenosis of lumbar region at multiple levels 01/20/2015  . Screening, ischemic heart disease 01/03/2015  . Hypertriglyceridemia 08/17/2014  . Obesity 08/17/2014  . Type 2 diabetes mellitus without  complication ([place]) 01/00/7121  . Hx of adenomatous colonic polyps 06/03/2014  . Essential hypertension, benign 09/13/2013  . Thrombocytopenia ([place]) 09/13/2013  . HTN (hypertension) 05/13/2013  . Hyperlipidemia   . Testicular hypofunction   . Biceps tendon rupture   . GERD (gastroesophageal reflux disease)   . Colon polyps     Percival Spanish, PT, MPT 01/25/2018, 12:33 PM  [facility] [address]  [place], [place], [address] Phone: [phone]   Fax:  [phone]  Name: Ian [name] MRN: [id number] Date of Birth: 1961-07-17

## 2018-01-28 ENCOUNTER — Encounter: Payer: Self-pay | Admitting: Physical Therapy

## 2018-01-28 ENCOUNTER — Ambulatory Visit: Payer: BLUE CROSS/BLUE SHIELD | Admitting: Physical Therapy

## 2018-01-28 DIAGNOSIS — R293 Abnormal posture: Secondary | ICD-10-CM

## 2018-01-28 DIAGNOSIS — M25511 Pain in right shoulder: Secondary | ICD-10-CM

## 2018-01-28 DIAGNOSIS — M25611 Stiffness of right shoulder, not elsewhere classified: Secondary | ICD-10-CM | POA: Diagnosis not present

## 2018-01-28 DIAGNOSIS — M6281 Muscle weakness (generalized): Secondary | ICD-10-CM

## 2018-01-28 NOTE — Therapy (Signed)
[facility] [address]  [place], [place], [address] Phone: [phone]   Fax:  [phone]  Physical Therapy Treatment  Patient Details  Name: Ian [name] MRN: [id number] Date of Birth: 20-Feb-1961 Referring Provider: Lacie Draft, PA-C for Donnald Garre, MD   Encounter Date: 01/28/2018  PT End of Session - 01/28/18 1436    Visit Number  8    Number of Visits  24    Date for PT Re-Evaluation  03/29/18    Authorization Type  BCBS - VL: 9    PT Start Time  6962    PT Stop Time  1533    PT Time Calculation (min)  57 min    Activity Tolerance  Patient tolerated treatment well    Behavior During Therapy  [facility] for tasks assessed/performed       Past Medical History:  Diagnosis Date  . Allergy   . Arthritis   . Biceps tendon rupture    bilateral  . Clostridium difficile infection 2014  . Colon polyps   . Diabetes ([place])    Borderline/Pre-diabetes  . GERD (gastroesophageal reflux disease)   . History of DVT (deep vein thrombosis)   . History of pulmonary embolism   . Hyperlipidemia   . Hypertension   . Internal bleeding hemorrhoids 2012   "might bleed once/month; only when I strain"  . Internal prolapsed hemorrhoids   . Leg cramps   . Testicular hypofunction   . Vertigo   . Wart of face    Right cheek    Past Surgical History:  Procedure Laterality Date  . ANAL FISSURE REPAIR  06/24/2013  . APPENDECTOMY  2011  . COLONOSCOPY W/ BIOPSIES AND POLYPECTOMY  2012  . COLONSCOPY  2015  . EXCISIONAL HEMORRHOIDECTOMY    . FISSURECTOMY    . HEMORRHOID SURGERY  06/24/2013  . internal sphincterotomy  06-24-2013  . LUMBAR LAMINECTOMY/DECOMPRESSION MICRODISCECTOMY N/A 01/20/2015   Procedure: MICRO LUMBAR DECOMPRESSION L4-5/L3-4/L2-3;  Surgeon: Johnn Hai, MD;  Location: [facility];  Service: Orthopedics;  Laterality: N/A;  . SHOULDER ARTHROSCOPY  08/22/2012   Procedure: ARTHROSCOPY SHOULDER;  Surgeon: Marin Shutter, MD;  Location: [facility];  Service: Orthopedics;  Laterality: Left;  Left shoulder arthroscopic lavage and debridement  . SHOULDER ARTHROSCOPY  09/02/2012   Procedure: ARTHROSCOPY SHOULDER;  Surgeon: Marin Shutter, MD;  Location: [facility];  Service: Orthopedics;  Laterality: Left;  Left shoulder arthroscopy irrigation and debridement  . SHOULDER ARTHROSCOPY W/ ROTATOR CUFF REPAIR  07/09/2012   left  . SHOULDER OPEN ROTATOR CUFF REPAIR Right 12/13/2017   Procedure: Right shoulder mini open rotator cuff repair;  Surgeon: Susa Day, MD;  Location: [facility];  Service: Orthopedics;  Laterality: Right;  Interscalene Block  . SKIN LESION EXCISION     Benign  . WISDOM TOOTH EXTRACTION  YRS AGO    There were no vitals filed for this visit.  Subjective Assessment - 01/28/18 1439    Subjective  Pt noting some increased soreness since progresssing to AAROM.    Pertinent History  12/13/17 - R shoulder: Mini open rotator cuff repair utilizing JuggerKnot suture anchors of supraspinatus and infraspinatus; Subacromial decompression; Acromioplasty    Patient Stated Goals  "to get full use of my arm again"    Currently in Pain?  Yes    Pain Score  2     Pain Location  Shoulder    Pain Orientation  Right  Pain Descriptors / Indicators  Sore    Pain Type  Acute pain;Surgical pain                      [facility] Adult PT Treatment/Exercise - 01/28/18 1436      Exercises   Exercises  Shoulder      Shoulder Exercises: Supine   External Rotation  Right;AAROM;20 reps    External Rotation Limitations  wand/cane at ~60 dg ABD    Flexion  Right;AAROM;20 reps    Flexion Limitations  L hand guiding R - pt reporting better control than with cane/wand    ABduction  Right;AAROM;15 reps    ABduction Limitations  scaption with cane      Shoulder Exercises: Standing   Internal Rotation  Right;AAROM;15 reps    Internal Rotation Limitations  wand/cane behind back    Extension  Right;AAROM     Extension Limitations  wand/cane behind back + scap retraction      Shoulder Exercises: Pulleys   Flexion  3 minutes    ABduction  3 minutes    ABduction Limitations  scaption      Shoulder Exercises: Therapy Ball   Flexion  10 reps    Flexion Limitations  seated with green Pball    ABduction  10 reps    ABduction Limitations  R scaption      Modalities   Modalities  Vasopneumatic      Vasopneumatic   Number Minutes Vasopneumatic   10 minutes    Vasopnuematic Location   Shoulder    Vasopneumatic Pressure  Medium    Vasopneumatic Temperature   lowest      Manual Therapy   Manual Therapy  Joint mobilization;Passive ROM;Soft tissue mobilization;Myofascial release    Manual therapy comments  pt supine    Joint Mobilization  R shoulder - gentle distraction with small circles, grade II-III A/P & inferior glides    Soft tissue mobilization  R biceps, pecs & posterior capusule    Myofascial Release  TPR - R biceps    Passive ROM  R shoulder PROM all directions to pt tolerance               PT Short Term Goals - 01/28/18 1441      PT SHORT TERM GOAL #1   Title  independent with initial HEP    Status  Achieved      PT SHORT TERM GOAL #2   Title  R shoulder PROM equivalent to L shoulder AROM    Status  On-going      PT SHORT TERM GOAL #3   Title  Pt will demonstrate neutral shoulder alignment to promote proper glenohumeral kinematics    Status  On-going        PT Long Term Goals - 01/28/18 1442      PT LONG TERM GOAL #1   Title  Independent with ongoing HEP    Status  On-going      PT LONG TERM GOAL #2   Title  R shoulder AROM equivalent to L shoulder w/o pain    Status  On-going      PT LONG TERM GOAL #3   Title  R shoulder strength >/= 4-/5 to 4/5 for improved function    Status  On-going      PT LONG TERM GOAL #4   Title  Pt will report no limitation in performance of ADLs or light household chores due to R shoulder pain,  LOM or weakness    Status   On-going            Plan - 01/28/18 1443    Clinical Impression Statement  Pt reporting some mild increased soreness since initiation of AAROM with greatest difficulty lowering arm to side after AAROM flexion. Pt also noting preference for L hand guided AAROM vs wand, with pt able to still demonstrate proper motion with R UE. Good tolerance for progression during session today, only noting fatigue as expected, but treatment concluded with vasopneumatic compression to minimize post-exercise pain and swelling.    Rehab Potential  Good    PT Frequency  -- pt requesting 2x/wk d/t insurance deductible    PT Treatment/Interventions  Patient/family education;ADLs/Self Care Home Management;Manual techniques;Passive range of motion;Scar mobilization;Therapeutic exercise;Therapeutic activities;Electrical Stimulation;Cryotherapy;Vasopneumatic Device;Moist Heat;Iontophoresis 4mg /ml Dexamethasone;Dry needling;Taping    PT Next Visit Plan  AAROM x 4 wks (as of 01/24/18), then AROM/strengthening    Consulted and Agree with Plan of Care  Patient       Patient will benefit from skilled therapeutic intervention in order to improve the following deficits and impairments:  Decreased range of motion, Impaired flexibility, Pain, Impaired UE functional use, Postural dysfunction, Increased muscle spasms, Increased fascial restricitons, Decreased scar mobility, Decreased activity tolerance  Visit Diagnosis: Stiffness of right shoulder, not elsewhere classified  Acute pain of right shoulder  Muscle weakness (generalized)  Abnormal posture     Problem List Patient Active Problem List   Diagnosis Date Noted  . Complete rotator cuff tear 12/13/2017  . Claudication ([place]) 09/13/2017  . Allergy to pollen 11/06/2016  . Pulmonary embolism, bilateral ([place])   . Lower leg DVT (deep venous thromboembolism), acute ([place])   . Spinal stenosis of lumbar region at multiple levels 01/20/2015  . Screening, ischemic heart  disease 01/03/2015  . Hypertriglyceridemia 08/17/2014  . Obesity 08/17/2014  . Type 2 diabetes mellitus without complication ([place]) 26/94/8546  . Hx of adenomatous colonic polyps 06/03/2014  . Essential hypertension, benign 09/13/2013  . Thrombocytopenia ([place]) 09/13/2013  . HTN (hypertension) 05/13/2013  . Hyperlipidemia   . Testicular hypofunction   . Biceps tendon rupture   . GERD (gastroesophageal reflux disease)   . Colon polyps     Percival Spanish, PT, MPT 01/28/2018, 3:33 PM  [facility] [address]  [place], [place], [address] Phone: [phone]   Fax:  [phone]  Name: Ian [name] MRN: [id number] Date of Birth: 06-26-1961

## 2018-01-31 ENCOUNTER — Ambulatory Visit: Payer: BLUE CROSS/BLUE SHIELD

## 2018-01-31 DIAGNOSIS — M6281 Muscle weakness (generalized): Secondary | ICD-10-CM

## 2018-01-31 DIAGNOSIS — M25611 Stiffness of right shoulder, not elsewhere classified: Secondary | ICD-10-CM | POA: Diagnosis not present

## 2018-01-31 DIAGNOSIS — R293 Abnormal posture: Secondary | ICD-10-CM

## 2018-01-31 DIAGNOSIS — M25511 Pain in right shoulder: Secondary | ICD-10-CM

## 2018-01-31 NOTE — Therapy (Signed)
[facility] [address]  [place], [place], [address] Phone: [phone]   Fax:  [phone]  Physical Therapy Treatment  Patient Details  Name: Ian [name] MRN: [id number] Date of Birth: 08/16/1961 Referring Provider: Lacie Draft, PA-C for Donnald Garre, MD   Encounter Date: 01/31/2018  PT End of Session - 01/31/18 1439    Visit Number  9    Number of Visits  24    Date for PT Re-Evaluation  03/29/18    Authorization Type  BCBS - VL: 8    PT Start Time  9518    PT Stop Time  8416    PT Time Calculation (min)  58 min    Activity Tolerance  Patient tolerated treatment well    Behavior During Therapy  [facility] for tasks assessed/performed       Past Medical History:  Diagnosis Date  . Allergy   . Arthritis   . Biceps tendon rupture    bilateral  . Clostridium difficile infection 2014  . Colon polyps   . Diabetes ([place])    Borderline/Pre-diabetes  . GERD (gastroesophageal reflux disease)   . History of DVT (deep vein thrombosis)   . History of pulmonary embolism   . Hyperlipidemia   . Hypertension   . Internal bleeding hemorrhoids 2012   "might bleed once/month; only when I strain"  . Internal prolapsed hemorrhoids   . Leg cramps   . Testicular hypofunction   . Vertigo   . Wart of face    Right cheek    Past Surgical History:  Procedure Laterality Date  . ANAL FISSURE REPAIR  06/24/2013  . APPENDECTOMY  2011  . COLONOSCOPY W/ BIOPSIES AND POLYPECTOMY  2012  . COLONSCOPY  2015  . EXCISIONAL HEMORRHOIDECTOMY    . FISSURECTOMY    . HEMORRHOID SURGERY  06/24/2013  . internal sphincterotomy  06-24-2013  . LUMBAR LAMINECTOMY/DECOMPRESSION MICRODISCECTOMY N/A 01/20/2015   Procedure: MICRO LUMBAR DECOMPRESSION L4-5/L3-4/L2-3;  Surgeon: Johnn Hai, MD;  [facility];  Service: Orthopedics;  Laterality: N/A;  . SHOULDER ARTHROSCOPY  08/22/2012   Procedure: ARTHROSCOPY SHOULDER;  Surgeon: Marin Shutter, MD;  Location: [facility];  Service: Orthopedics;  Laterality: Left;  Left shoulder arthroscopic lavage and debridement  . SHOULDER ARTHROSCOPY  09/02/2012   Procedure: ARTHROSCOPY SHOULDER;  Surgeon: Marin Shutter, MD;  Location: [facility];  Service: Orthopedics;  Laterality: Left;  Left shoulder arthroscopy irrigation and debridement  . SHOULDER ARTHROSCOPY W/ ROTATOR CUFF REPAIR  07/09/2012   left  . SHOULDER OPEN ROTATOR CUFF REPAIR Right 12/13/2017   Procedure: Right shoulder mini open rotator cuff repair;  Surgeon: Susa Day, MD;  Location: [facility];  Service: Orthopedics;  Laterality: Right;  Interscalene Block  . SKIN LESION EXCISION     Benign  . WISDOM TOOTH EXTRACTION  YRS AGO    There were no vitals filed for this visit.  Subjective Assessment - 01/31/18 1433    Subjective  Pt. reporting he had some "overall stiffness" last night however felt good after last visit.      Pertinent History  12/13/17 - R shoulder: Mini open rotator cuff repair utilizing JuggerKnot suture anchors of supraspinatus and infraspinatus; Subacromial decompression; Acromioplasty    Patient Stated Goals  "to get full use of my arm again"    Currently in Pain?  No/denies    Pain Score  0-No pain    Multiple Pain Sites  No  [facility] Adult PT Treatment/Exercise - 01/31/18 1447      Shoulder Exercises: Supine   External Rotation  Right;AAROM;20 reps    External Rotation Limitations  wand/cane at ~60 dg ABD    Flexion  Right;AAROM;20 reps    Flexion Limitations  L hand guiding R     ABduction  Right;AAROM;15 reps    ABduction Limitations  L hand guiding R - as pt. noting improved comfort with this      Shoulder Exercises: Seated   Flexion  Right;PROM;10 reps    Flexion Limitations  table slides    Abduction  Right;10 reps;PROM    ABduction Limitations  scaption table slides      Shoulder Exercises: Standing   Internal Rotation  Right;AAROM;15 reps    Internal  Rotation Limitations  wand/cane behind back    Extension  Right;AAROM;15 reps    Extension Limitations  wand/cane behind back + scap retraction    Retraction  10 reps;Both    Retraction Limitations  3" hold       Shoulder Exercises: Pulleys   Flexion  3 minutes    ABduction  3 minutes    ABduction Limitations  scaption      Shoulder Exercises: Therapy Ball   Flexion  15 reps    Flexion Limitations  seated with green Pball    ABduction  15 reps    ABduction Limitations  R scaption      Vasopneumatic   Number Minutes Vasopneumatic   10 minutes    Vasopnuematic Location   Shoulder    Vasopneumatic Pressure  Medium    Vasopneumatic Temperature   lowest      Manual Therapy   Manual Therapy  Joint mobilization;Passive ROM;Soft tissue mobilization;Myofascial release    Manual therapy comments  pt supine    Joint Mobilization  R shoulder - grade II-III A/P & inferior glides    Soft tissue mobilization  R biceps, & posterior capsule    Passive ROM  R shoulder PROM all directions to pt tolerance               PT Short Term Goals - 01/28/18 1441      PT SHORT TERM GOAL #1   Title  independent with initial HEP    Status  Achieved      PT SHORT TERM GOAL #2   Title  R shoulder PROM equivalent to L shoulder AROM    Status  On-going      PT SHORT TERM GOAL #3   Title  Pt will demonstrate neutral shoulder alignment to promote proper glenohumeral kinematics    Status  On-going        PT Long Term Goals - 01/28/18 1442      PT LONG TERM GOAL #1   Title  Independent with ongoing HEP    Status  On-going      PT LONG TERM GOAL #2   Title  R shoulder AROM equivalent to L shoulder w/o pain    Status  On-going      PT LONG TERM GOAL #3   Title  R shoulder strength >/= 4-/5 to 4/5 for improved function    Status  On-going      PT LONG TERM GOAL #4   Title  Pt will report no limitation in performance of ADLs or light household chores due to R shoulder pain, LOM or  weakness    Status  On-going  Plan - 01/31/18 1440    Clinical Impression Statement  Loranzo doing well today reporting some stiffness however felt good following last visit.  Continued to tolerate gentle AAROM activities well today with slight progression in repetitions.  Some remaining tenderness/tightness in posterior shoulder today thus targeted manual work to this area for relaxation of musculature.  Giordano seems to be progressing well toward goals.    PT Treatment/Interventions  Patient/family education;ADLs/Self Care Home Management;Manual techniques;Passive range of motion;Scar mobilization;Therapeutic exercise;Therapeutic activities;Electrical Stimulation;Cryotherapy;Vasopneumatic Device;Moist Heat;Iontophoresis 4mg /ml Dexamethasone;Dry needling;Taping    PT Next Visit Plan  10th visit; FOTO;  AAROM x 4 wks (as of 01/24/18), then AROM/strengthening    Consulted and Agree with Plan of Care  Patient       Patient will benefit from skilled therapeutic intervention in order to improve the following deficits and impairments:  Decreased range of motion, Impaired flexibility, Pain, Impaired UE functional use, Postural dysfunction, Increased muscle spasms, Increased fascial restricitons, Decreased scar mobility, Decreased activity tolerance  Visit Diagnosis: Stiffness of right shoulder, not elsewhere classified  Acute pain of right shoulder  Muscle weakness (generalized)  Abnormal posture     Problem List Patient Active Problem List   Diagnosis Date Noted  . Complete rotator cuff tear 12/13/2017  . Claudication ([place]) 09/13/2017  . Allergy to pollen 11/06/2016  . Pulmonary embolism, bilateral ([place])   . Lower leg DVT (deep venous thromboembolism), acute ([place])   . Spinal stenosis of lumbar region at multiple levels 01/20/2015  . Screening, ischemic heart disease 01/03/2015  . Hypertriglyceridemia 08/17/2014  . Obesity 08/17/2014  . Type 2 diabetes mellitus without  complication ([place]) 02/72/5366  . Hx of adenomatous colonic polyps 06/03/2014  . Essential hypertension, benign 09/13/2013  . Thrombocytopenia ([employer]) 09/13/2013  . HTN (hypertension) 05/13/2013  . Hyperlipidemia   . Testicular hypofunction   . Biceps tendon rupture   . GERD (gastroesophageal reflux disease)   . Colon polyps     Bess Harvest, PTA 01/31/18 6:17 PM  [facility] [address]  [place], [place], [address] Phone: [phone]   Fax:  [phone]  Name: SHUNSUKE GRANZOW MRN: [id number] Date of Birth: Jul 27, 1961

## 2018-02-04 ENCOUNTER — Ambulatory Visit: Payer: BLUE CROSS/BLUE SHIELD

## 2018-02-04 DIAGNOSIS — M25611 Stiffness of right shoulder, not elsewhere classified: Secondary | ICD-10-CM | POA: Diagnosis not present

## 2018-02-04 DIAGNOSIS — M6281 Muscle weakness (generalized): Secondary | ICD-10-CM

## 2018-02-04 DIAGNOSIS — M25511 Pain in right shoulder: Secondary | ICD-10-CM

## 2018-02-04 DIAGNOSIS — R293 Abnormal posture: Secondary | ICD-10-CM

## 2018-02-04 NOTE — Therapy (Signed)
[facility] [address]  [place], [place], [address] Phone: [phone]   Fax:  [phone]  Physical Therapy Treatment  Patient Details  Name: Ian [name] MRN: [id number] Date of Birth: 11-Apr-1961 Referring Provider: Lacie Draft, PA-C for Donnald Garre, MD   Encounter Date: 02/04/2018  PT End of Session - 02/04/18 1019    Visit Number  10    Number of Visits  24    Date for PT Re-Evaluation  03/29/18    Authorization Type  BCBS - VL: 47    PT Start Time  1017    PT Stop Time  1110    PT Time Calculation (min)  53 min    Activity Tolerance  Patient tolerated treatment well    Behavior During Therapy  [facility] for tasks assessed/performed       Past Medical History:  Diagnosis Date  . Allergy   . Arthritis   . Biceps tendon rupture    bilateral  . Clostridium difficile infection 2014  . Colon polyps   . Diabetes ([place])    Borderline/Pre-diabetes  . GERD (gastroesophageal reflux disease)   . History of DVT (deep vein thrombosis)   . History of pulmonary embolism   . Hyperlipidemia   . Hypertension   . Internal bleeding hemorrhoids 2012   "might bleed once/month; only when I strain"  . Internal prolapsed hemorrhoids   . Leg cramps   . Testicular hypofunction   . Vertigo   . Wart of face    Right cheek    Past Surgical History:  Procedure Laterality Date  . ANAL FISSURE REPAIR  06/24/2013  . APPENDECTOMY  2011  . COLONOSCOPY W/ BIOPSIES AND POLYPECTOMY  2012  . COLONSCOPY  2015  . EXCISIONAL HEMORRHOIDECTOMY    . FISSURECTOMY    . HEMORRHOID SURGERY  06/24/2013  . internal sphincterotomy  06-24-2013  . LUMBAR LAMINECTOMY/DECOMPRESSION MICRODISCECTOMY N/A 01/20/2015   Procedure: MICRO LUMBAR DECOMPRESSION L4-5/L3-4/L2-3;  Surgeon: Johnn Hai, MD;  Location: [facility];  Service: Orthopedics;  Laterality: N/A;  . SHOULDER ARTHROSCOPY  08/22/2012   Procedure: ARTHROSCOPY SHOULDER;  Surgeon: Marin Shutter, MD;  Location: [place];  Service: Orthopedics;  Laterality: Left;  Left shoulder arthroscopic lavage and debridement  . SHOULDER ARTHROSCOPY  09/02/2012   Procedure: ARTHROSCOPY SHOULDER;  Surgeon: Marin Shutter, MD;  Location: [place];  Service: Orthopedics;  Laterality: Left;  Left shoulder arthroscopy irrigation and debridement  . SHOULDER ARTHROSCOPY W/ ROTATOR CUFF REPAIR  07/09/2012   left  . SHOULDER OPEN ROTATOR CUFF REPAIR Right 12/13/2017   Procedure: Right shoulder mini open rotator cuff repair;  Surgeon: Susa Day, MD;  Location: [facility];  Service: Orthopedics;  Laterality: Right;  Interscalene Block  . SKIN LESION EXCISION     Benign  . WISDOM TOOTH EXTRACTION  YRS AGO    There were no vitals filed for this visit.  Subjective Assessment - 02/04/18 1019    Subjective  Pt. felt good following last visit.  Doing well today.      Pertinent History  12/13/17 - R shoulder: Mini open rotator cuff repair utilizing JuggerKnot suture anchors of supraspinatus and infraspinatus; Subacromial decompression; Acromioplasty    Patient Stated Goals  "to get full use of my arm again"    Currently in Pain?  No/denies    Pain Score  0-No pain    Multiple Pain Sites  No  [facility] PT Assessment - 02/04/18 1033      Observation/Other Assessments   Focus on Therapeutic Outcomes (FOTO)   47% (53% limitation)      ROM / Strength   AROM / PROM / Strength  AROM      AROM   AROM Assessment Site  Shoulder    Right/Left Shoulder  Left    Left Shoulder Flexion  154 Degrees    Left Shoulder ABduction  146 Degrees    Left Shoulder Internal Rotation  82 Degrees    Left Shoulder External Rotation  52 Degrees      PROM   PROM Assessment Site  Shoulder    Right/Left Shoulder  Right    Right Shoulder Flexion  151 Degrees    Right Shoulder ABduction  145 Degrees    Right Shoulder Internal Rotation  86 Degrees    Right Shoulder External Rotation  74 Degrees                   [facility] Adult PT Treatment/Exercise - 02/04/18 1049      Shoulder Exercises: Supine   External Rotation  Right;AAROM;20 reps    External Rotation Limitations  wand/cane at ~60 dg ABD    Flexion  Right;AROM;10 reps    Flexion Limitations  R only  fatigue noted at end of set     ABduction  Right;AAROM;15 reps    ABduction Limitations  L hand guiding R - as pt. noting improved comfort with this      Shoulder Exercises: Seated   Flexion  Right;10 reps;AAROM    Flexion Limitations  green p-ball rollout     Abduction  Right;10 reps;AAROM    ABduction Limitations  scaption p-ball rollouts       Shoulder Exercises: Standing   Retraction  10 reps;Both    Retraction Limitations  3" hold       Shoulder Exercises: Pulleys   Flexion  3 minutes    ABduction  3 minutes    ABduction Limitations  scaption      Shoulder Exercises: ROM/Strengthening   Wall Wash  R shoulder flexion, scaption wall wash x 5 reps  with L UE assist; good tolerance      Vasopneumatic   Number Minutes Vasopneumatic   10 minutes    Vasopnuematic Location   Shoulder R    Vasopneumatic Pressure  Medium    Vasopneumatic Temperature   lowest      Manual Therapy   Manual Therapy  Joint mobilization;Passive ROM;Soft tissue mobilization    Manual therapy comments  pt supine    Joint Mobilization  R shoulder - grade II-III A/P & inferior glides    Passive ROM  R shoulder PROM all directions to pt tolerance             PT Education - 02/04/18 1112    Education provided  Yes    Education Details  flexion, scaption pulleys     Person(s) Educated  Patient    Methods  Explanation;Demonstration;Verbal cues;Handout    Comprehension  Verbalized understanding;Returned demonstration;Verbal cues required;Need further instruction       PT Short Term Goals - 02/04/18 1046      PT SHORT TERM GOAL #1   Title  independent with initial HEP    Status  Achieved      PT SHORT TERM GOAL #2   Title  R  shoulder PROM equivalent to L shoulder AROM    Status  Achieved  PT SHORT TERM GOAL #3   Title  Pt will demonstrate neutral shoulder alignment to promote proper glenohumeral kinematics    Status  On-going        PT Long Term Goals - 01/28/18 1442      PT LONG TERM GOAL #1   Title  Independent with ongoing HEP    Status  On-going      PT LONG TERM GOAL #2   Title  R shoulder AROM equivalent to L shoulder w/o pain    Status  On-going      PT LONG TERM GOAL #3   Title  R shoulder strength >/= 4-/5 to 4/5 for improved function    Status  On-going      PT LONG TERM GOAL #4   Title  Pt will report no limitation in performance of ADLs or light household chores due to R shoulder pain, LOM or weakness    Status  On-going            Plan - 02/04/18 1050    Clinical Impression Statement  Ian [name] making good progress with therapy thus far.  Tolerated mild progression in R shoulder AAROM activities today with only mild fatigue noted.  Able to demo roughly equal R shoulder PROM and L shoulder AROM today meeting STG.  Still frequently demonstrating forward, slumped sitting posture in treatment and admits to limited awareness of this at home.  Encouraged pt. to be mindful of proper sitting posture at home with rationale provided as to avoid tightening of anterior shoulder musculature.  HEP updated with pulley flexion, scaption as pt. reports he has pulleys at home and has been able to perform in treatment with good technique.  Progressing well toward goals.      PT Treatment/Interventions  Patient/family education;ADLs/Self Care Home Management;Manual techniques;Passive range of motion;Scar mobilization;Therapeutic exercise;Therapeutic activities;Electrical Stimulation;Cryotherapy;Vasopneumatic Device;Moist Heat;Iontophoresis 4mg /ml Dexamethasone;Dry needling;Taping    PT Next Visit Plan  AAROM x 4 wks (as of 01/24/18), then AROM/strengthening       Patient will benefit from skilled  therapeutic intervention in order to improve the following deficits and impairments:  Decreased range of motion, Impaired flexibility, Pain, Impaired UE functional use, Postural dysfunction, Increased muscle spasms, Increased fascial restricitons, Decreased scar mobility, Decreased activity tolerance  Visit Diagnosis: Stiffness of right shoulder, not elsewhere classified  Acute pain of right shoulder  Muscle weakness (generalized)  Abnormal posture     Problem List Patient Active Problem List   Diagnosis Date Noted  . Complete rotator cuff tear 12/13/2017  . Claudication ([place]) 09/13/2017  . Allergy to pollen 11/06/2016  . Pulmonary embolism, bilateral ([place])   . Lower leg DVT (deep venous thromboembolism), acute ([place])   . Spinal stenosis of lumbar region at multiple levels 01/20/2015  . Screening, ischemic heart disease 01/03/2015  . Hypertriglyceridemia 08/17/2014  . Obesity 08/17/2014  . Type 2 diabetes mellitus without complication ([place]) 32/11/2481  . Hx of adenomatous colonic polyps 06/03/2014  . Essential hypertension, benign 09/13/2013  . Thrombocytopenia ([place]) 09/13/2013  . HTN (hypertension) 05/13/2013  . Hyperlipidemia   . Testicular hypofunction   . Biceps tendon rupture   . GERD (gastroesophageal reflux disease)   . Colon polyps     Bess Harvest, PTA 02/04/18 11:25 AM  [facility] [address]  [place], [place], [address] Phone: [phone]   Fax:  [phone]  Name: Ian [name] MRN: [id number] Date of Birth: Oct 05, 1961

## 2018-02-07 ENCOUNTER — Ambulatory Visit: Payer: BLUE CROSS/BLUE SHIELD | Admitting: Physical Therapy

## 2018-02-07 ENCOUNTER — Encounter: Payer: Self-pay | Admitting: Physical Therapy

## 2018-02-07 DIAGNOSIS — M25511 Pain in right shoulder: Secondary | ICD-10-CM

## 2018-02-07 DIAGNOSIS — R293 Abnormal posture: Secondary | ICD-10-CM

## 2018-02-07 DIAGNOSIS — M6281 Muscle weakness (generalized): Secondary | ICD-10-CM

## 2018-02-07 DIAGNOSIS — M25611 Stiffness of right shoulder, not elsewhere classified: Secondary | ICD-10-CM

## 2018-02-07 NOTE — Therapy (Signed)
[facility] [address]  [place], [place], [address] Phone: [phone]   Fax:  [phone]  Physical Therapy Treatment  Patient Details  Name: Ian [name] MRN: [id number] Date of Birth: July 22, 1961 Referring Provider: Lacie Draft, PA-C for Donnald Garre, MD   Encounter Date: 02/07/2018  PT End of Session - 02/07/18 1444    Visit Number  11    Number of Visits  24    Date for PT Re-Evaluation  03/29/18    Authorization Type  BCBS - VL: 61    PT Start Time  2992    PT Stop Time  1534    PT Time Calculation (min)  50 min    Activity Tolerance  Patient tolerated treatment well    Behavior During Therapy  [facility] for tasks assessed/performed       Past Medical History:  Diagnosis Date  . Allergy   . Arthritis   . Biceps tendon rupture    bilateral  . Clostridium difficile infection 2014  . Colon polyps   . Diabetes ([place])    Borderline/Pre-diabetes  . GERD (gastroesophageal reflux disease)   . History of DVT (deep vein thrombosis)   . History of pulmonary embolism   . Hyperlipidemia   . Hypertension   . Internal bleeding hemorrhoids 2012   "might bleed once/month; only when I strain"  . Internal prolapsed hemorrhoids   . Leg cramps   . Testicular hypofunction   . Vertigo   . Wart of face    Right cheek    Past Surgical History:  Procedure Laterality Date  . ANAL FISSURE REPAIR  06/24/2013  . APPENDECTOMY  2011  . COLONOSCOPY W/ BIOPSIES AND POLYPECTOMY  2012  . COLONSCOPY  2015  . EXCISIONAL HEMORRHOIDECTOMY    . FISSURECTOMY    . HEMORRHOID SURGERY  06/24/2013  . internal sphincterotomy  06-24-2013  . LUMBAR LAMINECTOMY/DECOMPRESSION MICRODISCECTOMY N/A 01/20/2015   Procedure: MICRO LUMBAR DECOMPRESSION L4-5/L3-4/L2-3;  Surgeon: Johnn Hai, MD;  Location: [facility];  Service: Orthopedics;  Laterality: N/A;  . SHOULDER ARTHROSCOPY  08/22/2012   Procedure: ARTHROSCOPY SHOULDER;  Surgeon: Marin Shutter, MD;  Location: [place];  Service: Orthopedics;  Laterality: Left;  Left shoulder arthroscopic lavage and debridement  . SHOULDER ARTHROSCOPY  09/02/2012   Procedure: ARTHROSCOPY SHOULDER;  Surgeon: Marin Shutter, MD;  Location: [place];  Service: Orthopedics;  Laterality: Left;  Left shoulder arthroscopy irrigation and debridement  . SHOULDER ARTHROSCOPY W/ ROTATOR CUFF REPAIR  07/09/2012   left  . SHOULDER OPEN ROTATOR CUFF REPAIR Right 12/13/2017   Procedure: Right shoulder mini open rotator cuff repair;  Surgeon: Susa Day, MD;  Location: [facility];  Service: Orthopedics;  Laterality: Right;  Interscalene Block  . SKIN LESION EXCISION     Benign  . WISDOM TOOTH EXTRACTION  YRS AGO    There were no vitals filed for this visit.  Subjective Assessment - 02/07/18 1447    Subjective  Pt noting some post exercise soreness today but no real pain.    Pertinent History  12/13/17 - R shoulder: Mini open rotator cuff repair utilizing JuggerKnot suture anchors of supraspinatus and infraspinatus; Subacromial decompression; Acromioplasty    Patient Stated Goals  "to get full use of my arm again"    Pain Score  2     Pain Location  Shoulder    Pain Orientation  Right    Pain Descriptors / Indicators  Sore    Pain Type  Surgical pain;Acute pain    Pain Frequency  Intermittent                      [facility] Adult PT Treatment/Exercise - 02/07/18 1444      Exercises   Exercises  Shoulder      Shoulder Exercises: Supine   Protraction  Right;10 reps;Weights 2 sets    Protraction Weight (lbs)  1 2nd set    Other Supine Exercises  R shoulder small circles at 90 dg flexion x10 CW/CCW      Shoulder Exercises: Prone   Retraction  Right;10 reps;AROM    Retraction Limitations  prone row - seated leaning forward on green Pball    Extension  Right;AROM 8 reps (due to abdominal cramp)    Extension Limitations  I's - seated leaning forward on green Pball      Shoulder Exercises: Standing    Flexion  Right;AAROM;10 reps    Flexion Limitations  wall ladder to pt tolerance    Row  Both;10 reps;Theraband;Strengthening    Theraband Level (Shoulder Row)  Level 1 (Yellow)    Retraction  Both;10 reps;Theraband;Strengthening    Theraband Level (Shoulder Retraction)  Level 1 (Yellow)    Retraction Limitations  scap retraction + mini shoulder exetnsion to neutral      Shoulder Exercises: Pulleys   Flexion  3 minutes    ABduction  3 minutes    ABduction Limitations  scaption      Shoulder Exercises: ROM/Strengthening   Rhythmic Stabilization, Supine  R shoulder at 90 dg flexion 3x20"      Modalities   Modalities  Vasopneumatic      Vasopneumatic   Number Minutes Vasopneumatic   10 minutes    Vasopnuematic Location   Shoulder    Vasopneumatic Pressure  Medium    Vasopneumatic Temperature   lowest               PT Short Term Goals - 02/04/18 1046      PT SHORT TERM GOAL #1   Title  independent with initial HEP    Status  Achieved      PT SHORT TERM GOAL #2   Title  R shoulder PROM equivalent to L shoulder AROM    Status  Achieved      PT SHORT TERM GOAL #3   Title  Pt will demonstrate neutral shoulder alignment to promote proper glenohumeral kinematics    Status  On-going        PT Long Term Goals - 01/28/18 1442      PT LONG TERM GOAL #1   Title  Independent with ongoing HEP    Status  On-going      PT LONG TERM GOAL #2   Title  R shoulder AROM equivalent to L shoulder w/o pain    Status  On-going      PT LONG TERM GOAL #3   Title  R shoulder strength >/= 4-/5 to 4/5 for improved function    Status  On-going      PT LONG TERM GOAL #4   Title  Pt will report no limitation in performance of ADLs or light household chores due to R shoulder pain, LOM or weakness    Status  On-going            Plan - 02/07/18 1448    Clinical Impression Statement  Pt reporting mild increased soreness with increased activity/exercises  at home, but denies any  significant pain. R shoulder slow to warm-up with AAROM but gradually tolerating increased ROM with increased repps/time. Promoted scapular stabilization with rhymthic stabilization and light scapular strengthening with good tolerance other than pt not able to tolerate prone lying, therefore position modified to sitting leaning forward over green Pball.    Rehab Potential  Good    PT Treatment/Interventions  Patient/family education;ADLs/Self Care Home Management;Manual techniques;Passive range of motion;Scar mobilization;Therapeutic exercise;Therapeutic activities;Electrical Stimulation;Cryotherapy;Vasopneumatic Device;Moist Heat;Iontophoresis 4mg /ml Dexamethasone;Dry needling;Taping    PT Next Visit Plan  AAROM x 4 wks (as of 01/24/18), then AROM/strengthening    Consulted and Agree with Plan of Care  Patient       Patient will benefit from skilled therapeutic intervention in order to improve the following deficits and impairments:  Decreased range of motion, Impaired flexibility, Pain, Impaired UE functional use, Postural dysfunction, Increased muscle spasms, Increased fascial restricitons, Decreased scar mobility, Decreased activity tolerance  Visit Diagnosis: Stiffness of right shoulder, not elsewhere classified  Acute pain of right shoulder  Muscle weakness (generalized)  Abnormal posture     Problem List Patient Active Problem List   Diagnosis Date Noted  . Complete rotator cuff tear 12/13/2017  . Claudication ([place]) 09/13/2017  . Allergy to pollen 11/06/2016  . Pulmonary embolism, bilateral ([place])   . Lower leg DVT (deep venous thromboembolism), acute ([place])   . Spinal stenosis of lumbar region at multiple levels 01/20/2015  . Screening, ischemic heart disease 01/03/2015  . Hypertriglyceridemia 08/17/2014  . Obesity 08/17/2014  . Type 2 diabetes mellitus without complication ([place]) 70/96/2836  . Hx of adenomatous colonic polyps 06/03/2014  . Essential hypertension, benign  09/13/2013  . Thrombocytopenia ([place]) 09/13/2013  . HTN (hypertension) 05/13/2013  . Hyperlipidemia   . Testicular hypofunction   . Biceps tendon rupture   . GERD (gastroesophageal reflux disease)   . Colon polyps     Percival Spanish, PT, MPT 02/07/2018, 4:50 PM  [facility] [address]  [place], [place], [address] Phone: [phone]   Fax:  [phone]  Name: Ian [name] MRN: [id number] Date of Birth: September 29, 1961

## 2018-02-11 ENCOUNTER — Encounter: Payer: Self-pay | Admitting: Physical Therapy

## 2018-02-11 ENCOUNTER — Ambulatory Visit: Payer: BLUE CROSS/BLUE SHIELD | Admitting: Physical Therapy

## 2018-02-11 DIAGNOSIS — M6281 Muscle weakness (generalized): Secondary | ICD-10-CM

## 2018-02-11 DIAGNOSIS — M25611 Stiffness of right shoulder, not elsewhere classified: Secondary | ICD-10-CM | POA: Diagnosis not present

## 2018-02-11 DIAGNOSIS — M25511 Pain in right shoulder: Secondary | ICD-10-CM

## 2018-02-11 DIAGNOSIS — R293 Abnormal posture: Secondary | ICD-10-CM

## 2018-02-11 NOTE — Therapy (Signed)
[facility] [address]  [place], [place], [address] Phone: [phone]   Fax:  [phone]  Physical Therapy Treatment  Patient Details  Name: Ian [name] MRN: [id number] Date of Birth: Sep 14, 1961 Referring Provider: Lacie Draft, PA-C for Donnald Garre, MD   Encounter Date: 02/11/2018  PT End of Session - 02/11/18 1615    Visit Number  12    Number of Visits  24    Date for PT Re-Evaluation  03/29/18    Authorization Type  BC[employer] - VL: 57    PT Start Time  3810    PT Stop Time  1705    PT Time Calculation (min)  50 min    Activity Tolerance  Patient tolerated treatment well    Behavior During Therapy  [facility] for tasks assessed/performed       Past Medical History:  Diagnosis Date  . Allergy   . Arthritis   . Biceps tendon rupture    bilateral  . Clostridium difficile infection 2014  . Colon polyps   . Diabetes ([place])    Borderline/Pre-diabetes  . GERD (gastroesophageal reflux disease)   . History of DVT (deep vein thrombosis)   . History of pulmonary embolism   . Hyperlipidemia   . Hypertension   . Internal bleeding hemorrhoids 2012   "might bleed once/month; only when I strain"  . Internal prolapsed hemorrhoids   . Leg cramps   . Testicular hypofunction   . Vertigo   . Wart of face    Right cheek    Past Surgical History:  Procedure Laterality Date  . ANAL FISSURE REPAIR  06/24/2013  . APPENDECTOMY  2011  . COLONOSCOPY W/ BIOPSIES AND POLYPECTOMY  2012  . COLONSCOPY  2015  . EXCISIONAL HEMORRHOIDECTOMY    . FISSURECTOMY    . HEMORRHOID SURGERY  06/24/2013  . internal sphincterotomy  06-24-2013  . LUMBAR LAMINECTOMY/DECOMPRESSION MICRODISCECTOMY N/A 01/20/2015   Procedure: MICRO LUMBAR DECOMPRESSION L4-5/L3-4/L2-3;  Surgeon: Johnn Hai, MD;  Location: [facility];  Service: Orthopedics;  Laterality: N/A;  . SHOULDER ARTHROSCOPY  08/22/2012   Procedure: ARTHROSCOPY SHOULDER;  Surgeon: Marin Shutter, MD;  Location: [place];  Service: Orthopedics;  Laterality: Left;  Left shoulder arthroscopic lavage and debridement  . SHOULDER ARTHROSCOPY  09/02/2012   Procedure: ARTHROSCOPY SHOULDER;  Surgeon: Marin Shutter, MD;  Location: [place];  Service: Orthopedics;  Laterality: Left;  Left shoulder arthroscopy irrigation and debridement  . SHOULDER ARTHROSCOPY W/ ROTATOR CUFF REPAIR  07/09/2012   left  . SHOULDER OPEN ROTATOR CUFF REPAIR Right 12/13/2017   Procedure: Right shoulder mini open rotator cuff repair;  Surgeon: Susa Day, MD;  Location: [facility];  Service: Orthopedics;  Laterality: Right;  Interscalene Block  . SKIN LESION EXCISION     Benign  . WISDOM TOOTH EXTRACTION  YRS AGO    There were no vitals filed for this visit.  Subjective Assessment - 02/11/18 1619    Subjective  Pt continuing to note some mild increased soreness after completing HEP at home    Pertinent History  12/13/17 - R shoulder: Mini open rotator cuff repair utilizing JuggerKnot suture anchors of supraspinatus and infraspinatus; Subacromial decompression; Acromioplasty    Patient Stated Goals  "to get full use of my arm again"    Currently in Pain?  Yes    Pain Score  2     Pain Location  Shoulder    Pain Orientation  Right    Pain Descriptors / Indicators  Sore    Pain Type  Surgical pain;Acute pain                      [facility] Adult PT Treatment/Exercise - 02/11/18 1615      Exercises   Exercises  Shoulder      Shoulder Exercises: Standing   Flexion  Right;AAROM    Flexion Limitations  wall ladder to pt tolerance; wand flexion    ABduction  Right;AAROM;10 reps 2 sets    ABduction Limitations  scaption wall ladder to pt tolerance; wand scaption      Shoulder Exercises: Pulleys   Flexion  3 minutes    ABduction  3 minutes    ABduction Limitations  scaption      Shoulder Exercises: Therapy Ball   Flexion  15 reps    Flexion Limitations  seated roll out with green Pball     ABduction  15 reps    ABduction Limitations  seated scaption roll out with green Pball      Cryotherapy   Number Minutes Cryotherapy  10 Minutes    Cryotherapy Location  Shoulder    Type of Cryotherapy  Ice pack      Manual Therapy   Manual Therapy  Joint mobilization;Soft tissue mobilization;Passive ROM    Joint Mobilization  R shoulder - gentle distraction with small circles, grade II-III A/P & inferior glides    Soft tissue mobilization  R biceps, pecs & posterior capusule    Passive ROM  R shoulder PROM all directions to pt tolerance             PT Education - 02/11/18 1654    Education provided  Yes    Education Details  HEP update - upright wand flexion & scaption/abduction    Person(s) Educated  Patient;Spouse    Methods  Explanation;Demonstration;Handout    Comprehension  Verbalized understanding;Returned demonstration;Need further instruction       PT Short Term Goals - 02/04/18 1046      PT SHORT TERM GOAL #1   Title  independent with initial HEP    Status  Achieved      PT SHORT TERM GOAL #2   Title  R shoulder PROM equivalent to L shoulder AROM    Status  Achieved      PT SHORT TERM GOAL #3   Title  Pt will demonstrate neutral shoulder alignment to promote proper glenohumeral kinematics    Status  On-going        PT Long Term Goals - 01/28/18 1442      PT LONG TERM GOAL #1   Title  Independent with ongoing HEP    Status  On-going      PT LONG TERM GOAL #2   Title  R shoulder AROM equivalent to L shoulder w/o pain    Status  On-going      PT LONG TERM GOAL #3   Title  R shoulder strength >/= 4-/5 to 4/5 for improved function    Status  On-going      PT LONG TERM GOAL #4   Title  Pt will report no limitation in performance of ADLs or light household chores due to R shoulder pain, LOM or weakness    Status  On-going            Plan - 02/11/18 1622    Clinical Impression Statement  Pt demonstrating improving tolernace for AAROM  with R  shoulder AAROM nearing available PROM in most planes. Progressed flexion & scaption/abduction AAROM to upright with good tolernace, therefore updated positioning included in HEP. Pt continues to note some soreness after perfromance of HEP and cautioned pt to remain conservative focusing on just AAROM avoiding AROM at this stage.    Rehab Potential  Good    PT Treatment/Interventions  Patient/family education;ADLs/Self Care Home Management;Manual techniques;Passive range of motion;Scar mobilization;Therapeutic exercise;Therapeutic activities;Electrical Stimulation;Cryotherapy;Vasopneumatic Device;Moist Heat;Iontophoresis 4mg /ml Dexamethasone;Dry needling;Taping    PT Next Visit Plan  AAROM x 4 wks (as of 01/24/18), then AROM/strengthening    Consulted and Agree with Plan of Care  Patient       Patient will benefit from skilled therapeutic intervention in order to improve the following deficits and impairments:  Decreased range of motion, Impaired flexibility, Pain, Impaired UE functional use, Postural dysfunction, Increased muscle spasms, Increased fascial restricitons, Decreased scar mobility, Decreased activity tolerance  Visit Diagnosis: Stiffness of right shoulder, not elsewhere classified  Acute pain of right shoulder  Muscle weakness (generalized)  Abnormal posture     Problem List Patient Active Problem List   Diagnosis Date Noted  . Complete rotator cuff tear 12/13/2017  . Claudication ([place]) 09/13/2017  . Allergy to pollen 11/06/2016  . Pulmonary embolism, bilateral ([place])   . Lower leg DVT (deep venous thromboembolism), acute ([place])   . Spinal stenosis of lumbar region at multiple levels 01/20/2015  . Screening, ischemic heart disease 01/03/2015  . Hypertriglyceridemia 08/17/2014  . Obesity 08/17/2014  . Type 2 diabetes mellitus without complication ([place]) 01/00/7121  . Hx of adenomatous colonic polyps 06/03/2014  . Essential hypertension, benign 09/13/2013  .  Thrombocytopenia ([place]) 09/13/2013  . HTN (hypertension) 05/13/2013  . Hyperlipidemia   . Testicular hypofunction   . Biceps tendon rupture   . GERD (gastroesophageal reflux disease)   . Colon polyps     Percival Spanish, PT, MPT 02/11/2018, 6:09 PM  [facility] [address]  [place], [place], [address] Phone: [phone]   Fax:  [phone]  Name: Ian [name] MRN: [id number] Date of Birth: [birth year]

## 2018-02-14 ENCOUNTER — Encounter: Payer: Self-pay | Admitting: Physical Therapy

## 2018-02-14 ENCOUNTER — Ambulatory Visit: Payer: BLUE CROSS/BLUE SHIELD | Admitting: Physical Therapy

## 2018-02-14 DIAGNOSIS — M6281 Muscle weakness (generalized): Secondary | ICD-10-CM

## 2018-02-14 DIAGNOSIS — R293 Abnormal posture: Secondary | ICD-10-CM

## 2018-02-14 DIAGNOSIS — M25611 Stiffness of right shoulder, not elsewhere classified: Secondary | ICD-10-CM

## 2018-02-14 DIAGNOSIS — M25511 Pain in right shoulder: Secondary | ICD-10-CM

## 2018-02-14 NOTE — Therapy (Signed)
[facility] [address]  [place], [place], [address] Phone: [phone]   Fax:  [phone]  Physical Therapy Treatment  Patient Details  Name: Ian [name] MRN: [id number] Date of Birth: 09-01-1961 Referring Provider: Lacie Draft, PA-C for Ian Garre, MD   Encounter Date: 02/14/2018  PT End of Session - 02/14/18 1442    Visit Number  13    Number of Visits  24    Date for PT Re-Evaluation  03/29/18    Authorization Type  BCBS - VL: 43    PT Start Time  9563    PT Stop Time  1538    PT Time Calculation (min)  56 min    Activity Tolerance  Patient tolerated treatment well    Behavior During Therapy  [facility] for tasks assessed/performed       Past Medical History:  Diagnosis Date  . Allergy   . Arthritis   . Biceps tendon rupture    bilateral  . Clostridium difficile infection 2014  . Colon polyps   . Diabetes ([place])    Borderline/Pre-diabetes  . GERD (gastroesophageal reflux disease)   . History of DVT (deep vein thrombosis)   . History of pulmonary embolism   . Hyperlipidemia   . Hypertension   . Internal bleeding hemorrhoids 2012   "might bleed once/month; only when I strain"  . Internal prolapsed hemorrhoids   . Leg cramps   . Testicular hypofunction   . Vertigo   . Wart of face    Right cheek    Past Surgical History:  Procedure Laterality Date  . ANAL FISSURE REPAIR  06/24/2013  . APPENDECTOMY  2011  . COLONOSCOPY W/ BIOPSIES AND POLYPECTOMY  2012  . COLONSCOPY  2015  . EXCISIONAL HEMORRHOIDECTOMY    . FISSURECTOMY    . HEMORRHOID SURGERY  06/24/2013  . internal sphincterotomy  06-24-2013  . LUMBAR LAMINECTOMY/DECOMPRESSION MICRODISCECTOMY N/A 01/20/2015   Procedure: MICRO LUMBAR DECOMPRESSION L4-5/L3-4/L2-3;  Surgeon: Ian Hai, MD;  [facility];  Service: Orthopedics;  Laterality: N/A;  . SHOULDER ARTHROSCOPY  08/22/2012   Procedure: ARTHROSCOPY SHOULDER;  Surgeon: Ian Shutter, MD;  Location: [place];  Service: Orthopedics;  Laterality: Left;  Left shoulder arthroscopic lavage and debridement  . SHOULDER ARTHROSCOPY  09/02/2012   Procedure: ARTHROSCOPY SHOULDER;  Surgeon: Ian Shutter, MD;  Location: [place];  Service: Orthopedics;  Laterality: Left;  Left shoulder arthroscopy irrigation and debridement  . SHOULDER ARTHROSCOPY W/ ROTATOR CUFF REPAIR  07/09/2012   left  . SHOULDER OPEN ROTATOR CUFF REPAIR Right 12/13/2017   Procedure: Right shoulder mini open rotator cuff repair;  Surgeon: Ian Day, MD;  Location: [facility];  Service: Orthopedics;  Laterality: Right;  Interscalene Block  . SKIN LESION EXCISION     Benign  . WISDOM TOOTH EXTRACTION  YRS AGO    There were no vitals filed for this visit.  Subjective Assessment - 02/14/18 1445    Subjective  Pt reports trying to go w/o the sling more.    Pertinent History  12/13/17 - R shoulder: Mini open rotator cuff repair utilizing JuggerKnot suture anchors of supraspinatus and infraspinatus; Subacromial decompression; Acromioplasty    Patient Stated Goals  "to get full use of my arm again"    Currently in Pain?  Yes    Pain Score  2     Pain Location  Shoulder    Pain Orientation  Right  Pain Descriptors / Indicators  Sore    Pain Type  Surgical pain;Acute pain                      [facility] Adult PT Treatment/Exercise - 02/14/18 1442      Exercises   Exercises  Shoulder      Shoulder Exercises: Supine   Protraction  Right;10 reps;Weights    Protraction Weight (lbs)  2    Other Supine Exercises  R shoulder small circles at 90 dg flexion 1# x10 CW/CCW      Shoulder Exercises: Prone   Retraction  Right;15 reps    Retraction Limitations  prone row - seated leaning forward on bolster in chair pt unable to tolerate lying prone    Extension  Right;15 reps    Extension Limitations  I's - seated leaning forward on bolster in chair pt unable to tolerate lying prone      Shoulder Exercises:  Standing   Flexion  Right;AAROM;15 reps    Flexion Limitations  wall ladder to pt tolerance    ABduction  Right;AAROM;10 reps    ABduction Limitations  scaption wall ladder to pt tolerance      Shoulder Exercises: Pulleys   Flexion  3 minutes    ABduction  3 minutes    ABduction Limitations  scaption      Shoulder Exercises: ROM/Strengthening   Rhythmic Stabilization, Supine  R shoulder at 90 dg flexion 3x20"      Modalities   Modalities  Cryotherapy      Cryotherapy   Number Minutes Cryotherapy  12 Minutes    Cryotherapy Location  Shoulder    Type of Cryotherapy  Ice pack      Manual Therapy   Manual Therapy  Joint mobilization;Soft tissue mobilization;Passive ROM    Joint Mobilization  R shoulder - gentle distraction with small circles, grade II-III A/P & inferior glides    Soft tissue mobilization  R biceps, pecs & posterior capusule    Passive ROM  R shoulder PROM all directions to pt tolerance               PT Short Term Goals - 02/04/18 1046      PT SHORT TERM GOAL #1   Title  independent with initial HEP    Status  Achieved      PT SHORT TERM GOAL #2   Title  R shoulder PROM equivalent to L shoulder AROM    Status  Achieved      PT SHORT TERM GOAL #3   Title  Pt will demonstrate neutral shoulder alignment to promote proper glenohumeral kinematics    Status  On-going        PT Long Term Goals - 01/28/18 1442      PT LONG TERM GOAL #1   Title  Independent with ongoing HEP    Status  On-going      PT LONG TERM GOAL #2   Title  R shoulder AROM equivalent to L shoulder w/o pain    Status  On-going      PT LONG TERM GOAL #3   Title  R shoulder strength >/= 4-/5 to 4/5 for improved function    Status  On-going      PT LONG TERM GOAL #4   Title  Pt will report no limitation in performance of ADLs or light household chores due to R shoulder pain, LOM or weakness    Status  On-going  Plan - 02/14/18 1449    Clinical Impression  Statement  Ian [name] continuing to report only mild pain/discomfort with increased activity and after HEP, and noting less stiffness/easier to "loosen up" shoulder during exercises. Good tolerance for scapular stabilization exercises, but continues to note some fatigue with upright AAROM exercises. One week remaining at [facility] level, with plan to progress to AROM and light strengthening at the end of next week.    Rehab Potential  Good    PT Treatment/Interventions  Patient/family education;ADLs/Self Care Home Management;Manual techniques;Passive range of motion;Scar mobilization;Therapeutic exercise;Therapeutic activities;Electrical Stimulation;Cryotherapy;Vasopneumatic Device;Moist Heat;Iontophoresis 4mg /ml Dexamethasone;Dry needling;Taping    PT Next Visit Plan  AAROM x 4 wks (as of 01/24/18), then AROM/strengthening    Consulted and Agree with Plan of Care  Patient       Patient will benefit from skilled therapeutic intervention in order to improve the following deficits and impairments:  Decreased range of motion, Impaired flexibility, Pain, Impaired UE functional use, Postural dysfunction, Increased muscle spasms, Increased fascial restricitons, Decreased scar mobility, Decreased activity tolerance  Visit Diagnosis: Stiffness of right shoulder, not elsewhere classified  Acute pain of right shoulder  Muscle weakness (generalized)  Abnormal posture     Problem List Patient Active Problem List   Diagnosis Date Noted  . Complete rotator cuff tear 12/13/2017  . Claudication ([place]) 09/13/2017  . Allergy to pollen 11/06/2016  . Pulmonary embolism, bilateral ([place])   . Lower leg DVT (deep venous thromboembolism), acute ([place])   . Spinal stenosis of lumbar region at multiple levels 01/20/2015  . Screening, ischemic heart disease 01/03/2015  . Hypertriglyceridemia 08/17/2014  . Obesity 08/17/2014  . Type 2 diabetes mellitus without complication ([place]) 93/81/0175  . Hx of adenomatous colonic  polyps 06/03/2014  . Essential hypertension, benign 09/13/2013  . Thrombocytopenia ([place]) 09/13/2013  . HTN (hypertension) 05/13/2013  . Hyperlipidemia   . Testicular hypofunction   . Biceps tendon rupture   . GERD (gastroesophageal reflux disease)   . Colon polyps     Percival Spanish, PT, MPT 02/14/2018, 4:41 PM  [facility] [address]  [place], [place], [address] Phone: [phone]   Fax:  [phone]  Name: Ian [name] MRN: [id number] Date of Birth: 1961/02/03

## 2018-02-18 ENCOUNTER — Encounter: Payer: BLUE CROSS/BLUE SHIELD | Admitting: Physical Therapy

## 2018-02-19 ENCOUNTER — Encounter: Payer: Self-pay | Admitting: Physical Therapy

## 2018-02-19 ENCOUNTER — Ambulatory Visit: Payer: BLUE CROSS/BLUE SHIELD | Admitting: Physical Therapy

## 2018-02-19 DIAGNOSIS — R293 Abnormal posture: Secondary | ICD-10-CM

## 2018-02-19 DIAGNOSIS — M25611 Stiffness of right shoulder, not elsewhere classified: Secondary | ICD-10-CM

## 2018-02-19 DIAGNOSIS — M25511 Pain in right shoulder: Secondary | ICD-10-CM

## 2018-02-19 DIAGNOSIS — M6281 Muscle weakness (generalized): Secondary | ICD-10-CM

## 2018-02-19 NOTE — Therapy (Signed)
[facility] [address]  [place], [place], [address] Phone: [phone]   Fax:  [phone]  Physical Therapy Treatment  Patient Details  Name: Ian [name] MRN: [id number] Date of Birth: [birth year] Referring Provider: Lacie Draft, PA-C for Donnald Garre, MD   Encounter Date: 02/19/2018  PT End of Session - 02/19/18 1445    Visit Number  14    Number of Visits  24    Date for PT Re-Evaluation  03/29/18    Authorization Type  BCBS - VL: 36    PT Start Time  6195    PT Stop Time  1537    PT Time Calculation (min)  52 min    Activity Tolerance  Patient tolerated treatment well    Behavior During Therapy  [facility] for tasks assessed/performed       Past Medical History:  Diagnosis Date  . Allergy   . Arthritis   . Biceps tendon rupture    bilateral  . Clostridium difficile infection 2014  . Colon polyps   . Diabetes ([place])    Borderline/Pre-diabetes  . GERD (gastroesophageal reflux disease)   . History of DVT (deep vein thrombosis)   . History of pulmonary embolism   . Hyperlipidemia   . Hypertension   . Internal bleeding hemorrhoids 2012   "might bleed once/month; only when I strain"  . Internal prolapsed hemorrhoids   . Leg cramps   . Testicular hypofunction   . Vertigo   . Wart of face    Right cheek    Past Surgical History:  Procedure Laterality Date  . ANAL FISSURE REPAIR  06/24/2013  . APPENDECTOMY  2011  . COLONOSCOPY W/ BIOPSIES AND POLYPECTOMY  2012  . COLONSCOPY  2015  . EXCISIONAL HEMORRHOIDECTOMY    . FISSURECTOMY    . HEMORRHOID SURGERY  06/24/2013  . internal sphincterotomy  06-24-2013  . LUMBAR LAMINECTOMY/DECOMPRESSION MICRODISCECTOMY N/A 01/20/2015   Procedure: MICRO LUMBAR DECOMPRESSION L4-5/L3-4/L2-3;  Surgeon: Johnn Hai, MD;  Location: [facility];  Service: Orthopedics;  Laterality: N/A;  . SHOULDER ARTHROSCOPY  08/22/2012   Procedure: ARTHROSCOPY SHOULDER;  Surgeon: Marin Shutter, MD;  Location: [place];  Service: Orthopedics;  Laterality: Left;  Left shoulder arthroscopic lavage and debridement  . SHOULDER ARTHROSCOPY  09/02/2012   Procedure: ARTHROSCOPY SHOULDER;  Surgeon: Marin Shutter, MD;  Location: [place];  Service: Orthopedics;  Laterality: Left;  Left shoulder arthroscopy irrigation and debridement  . SHOULDER ARTHROSCOPY W/ ROTATOR CUFF REPAIR  07/09/2012   left  . SHOULDER OPEN ROTATOR CUFF REPAIR Right 12/13/2017   Procedure: Right shoulder mini open rotator cuff repair;  Surgeon: Susa Day, MD;  Location: [facility];  Service: Orthopedics;  Laterality: Right;  Interscalene Block  . SKIN LESION EXCISION     Benign  . WISDOM TOOTH EXTRACTION  YRS AGO    There were no vitals filed for this visit.  Subjective Assessment - 02/19/18 1456    Subjective  Pt reports increased shoulder pain yesterday up to ~5/10 after taking his granddaughter to a concert in [place] on Sunday, but better after doing exercises today.    Pertinent History  12/13/17 - R shoulder: Mini open rotator cuff repair utilizing JuggerKnot suture anchors of supraspinatus and infraspinatus; Subacromial decompression; Acromioplasty    Patient Stated Goals  "to get full use of my arm again"    Currently in Pain?  Yes    Pain Score  2  Pain Location  Shoulder    Pain Orientation  Right    Pain Descriptors / Indicators  Sore    Pain Type  Surgical pain;Acute pain                      [facility] Adult PT Treatment/Exercise - 02/19/18 1445      Exercises   Exercises  Shoulder      Shoulder Exercises: Supine   Protraction  Right;15 reps;Weights    Protraction Weight (lbs)  2    Other Supine Exercises  R shoulder small circles at 90 dg flexion 1# x15 CW/CCW      Shoulder Exercises: Pulleys   Flexion  3 minutes    ABduction  3 minutes    ABduction Limitations  scaption      Shoulder Exercises: ROM/Strengthening   Wall Wash  R shoulder 60dg overhead arc 2x10    Rhythmic  Stabilization, Supine  R shoulder at 90 dg flexion 3x30"      Shoulder Exercises: Isometric Strengthening   Flexion  5X5"    Flexion Limitations  standing at wall - neutral shoulder    External Rotation  5X5";Supine    External Rotation Limitations  neutral shoulder - 2 finger resistance    Internal Rotation  5X5";Supine    Internal Rotation Limitations  neutral shoulder - 2 finger resistance    ABduction  5X5"    ABduction Limitations  standing at wall - neutral shoulder      Modalities   Modalities  Cryotherapy      Cryotherapy   Number Minutes Cryotherapy  10 Minutes    Cryotherapy Location  Shoulder    Type of Cryotherapy  Ice pack      Manual Therapy   Manual Therapy  Joint mobilization;Soft tissue mobilization;Passive ROM    Joint Mobilization  R shoulder - gentle distraction with small circles, grade II-III A/P & inferior glides    Soft tissue mobilization  R biceps, pecs & posterior capusule    Passive ROM  R shoulder PROM all directions to pt tolerance               PT Short Term Goals - 02/04/18 1046      PT SHORT TERM GOAL #1   Title  independent with initial HEP    Status  Achieved      PT SHORT TERM GOAL #2   Title  R shoulder PROM equivalent to L shoulder AROM    Status  Achieved      PT SHORT TERM GOAL #3   Title  Pt will demonstrate neutral shoulder alignment to promote proper glenohumeral kinematics    Status  On-going        PT Long Term Goals - 01/28/18 1442      PT LONG TERM GOAL #1   Title  Independent with ongoing HEP    Status  On-going      PT LONG TERM GOAL #2   Title  R shoulder AROM equivalent to L shoulder w/o pain    Status  On-going      PT LONG TERM GOAL #3   Title  R shoulder strength >/= 4-/5 to 4/5 for improved function    Status  On-going      PT LONG TERM GOAL #4   Title  Pt will report no limitation in performance of ADLs or light household chores due to R shoulder pain, LOM or weakness    Status  On-going             Plan - 02/19/18 1500    Clinical Impression Statement  Pt noting increased shoulder pain after driving to [facility] and attending concert with his granddaughter, with pain subsiding after completion of HEP yesterday and today. Pt ready to transition to AROM and light strengthening with light isometrics introduced today.    Rehab Potential  Good    PT Treatment/Interventions  Patient/family education;ADLs/Self Care Home Management;Manual techniques;Passive range of motion;Scar mobilization;Therapeutic exercise;Therapeutic activities;Electrical Stimulation;Cryotherapy;Vasopneumatic Device;Moist Heat;Iontophoresis 4mg /ml Dexamethasone;Dry needling;Taping    PT Next Visit Plan  progress to AROM/strengthening as of 02/21/18    Consulted and Agree with Plan of Care  Patient       Patient will benefit from skilled therapeutic intervention in order to improve the following deficits and impairments:  Decreased range of motion, Impaired flexibility, Pain, Impaired UE functional use, Postural dysfunction, Increased muscle spasms, Increased fascial restricitons, Decreased scar mobility, Decreased activity tolerance  Visit Diagnosis: Stiffness of right shoulder, not elsewhere classified  Acute pain of right shoulder  Muscle weakness (generalized)  Abnormal posture     Problem List Patient Active Problem List   Diagnosis Date Noted  . Complete rotator cuff tear 12/13/2017  . Claudication ([place]) 09/13/2017  . Allergy to pollen 11/06/2016  . Pulmonary embolism, bilateral ([employer])   . Lower leg DVT (deep venous thromboembolism), acute ([place])   . Spinal stenosis of lumbar region at multiple levels 01/20/2015  . Screening, ischemic heart disease 01/03/2015  . Hypertriglyceridemia 08/17/2014  . Obesity 08/17/2014  . Type 2 diabetes mellitus without complication ([place]) 32/91/9166  . Hx of adenomatous colonic polyps 06/03/2014  . Essential hypertension, benign 09/13/2013  . Thrombocytopenia  ([place]) 09/13/2013  . HTN (hypertension) 05/13/2013  . Hyperlipidemia   . Testicular hypofunction   . Biceps tendon rupture   . GERD (gastroesophageal reflux disease)   . Colon polyps     Percival Spanish, PT, MPT 02/19/2018, 4:36 PM  [facility] [address]  [place], [place], [address] Phone: [phone]   Fax:  [phone]  Name: Ian [name] MRN: [id number] Date of Birth: 22-Mar-1961

## 2018-02-21 ENCOUNTER — Ambulatory Visit: Payer: BLUE CROSS/BLUE SHIELD | Admitting: Physical Therapy

## 2018-02-25 ENCOUNTER — Encounter: Payer: Self-pay | Admitting: Physical Therapy

## 2018-02-26 ENCOUNTER — Encounter: Payer: Self-pay | Admitting: Physical Therapy

## 2018-02-26 ENCOUNTER — Ambulatory Visit: Payer: BLUE CROSS/BLUE SHIELD | Attending: Orthopedic Surgery | Admitting: Physical Therapy

## 2018-02-26 DIAGNOSIS — M6281 Muscle weakness (generalized): Secondary | ICD-10-CM | POA: Insufficient documentation

## 2018-02-26 DIAGNOSIS — M25511 Pain in right shoulder: Secondary | ICD-10-CM | POA: Insufficient documentation

## 2018-02-26 DIAGNOSIS — M25611 Stiffness of right shoulder, not elsewhere classified: Secondary | ICD-10-CM | POA: Diagnosis present

## 2018-02-26 DIAGNOSIS — R293 Abnormal posture: Secondary | ICD-10-CM

## 2018-02-26 NOTE — Therapy (Signed)
[facility] [address]  [place], [place], [address] Phone: [phone]   Fax:  [phone]  Physical Therapy Treatment  Patient Details  Name: Ian [name] MRN: [id number] Date of Birth: 04/23/1961 Referring Provider: Lacie Draft, PA-C for Donnald Garre, MD   Encounter Date: 02/26/2018  PT End of Session - 02/26/18 1618    Visit Number  15    Number of Visits  24    Date for PT Re-Evaluation  03/29/18    Authorization Type  BCBS - VL: 58    PT Start Time  1618    PT Stop Time  1709    PT Time Calculation (min)  51 min    Activity Tolerance  Patient tolerated treatment well    Behavior During Therapy  [facility] for tasks assessed/performed       Past Medical History:  Diagnosis Date  . Allergy   . Arthritis   . Biceps tendon rupture    bilateral  . Clostridium difficile infection 2014  . Colon polyps   . Diabetes ([place])    Borderline/Pre-diabetes  . GERD (gastroesophageal reflux disease)   . History of DVT (deep vein thrombosis)   . History of pulmonary embolism   . Hyperlipidemia   . Hypertension   . Internal bleeding hemorrhoids 2012   "might bleed once/month; only when I strain"  . Internal prolapsed hemorrhoids   . Leg cramps   . Testicular hypofunction   . Vertigo   . Wart of face    Right cheek    Past Surgical History:  Procedure Laterality Date  . ANAL FISSURE REPAIR  06/24/2013  . APPENDECTOMY  2011  . COLONOSCOPY W/ BIOPSIES AND POLYPECTOMY  2012  . COLONSCOPY  2015  . EXCISIONAL HEMORRHOIDECTOMY    . FISSURECTOMY    . HEMORRHOID SURGERY  06/24/2013  . internal sphincterotomy  06-24-2013  . LUMBAR LAMINECTOMY/DECOMPRESSION MICRODISCECTOMY N/A 01/20/2015   Procedure: MICRO LUMBAR DECOMPRESSION L4-5/L3-4/L2-3;  Surgeon: Johnn Hai, MD;  [facility];  Service: Orthopedics;  Laterality: N/A;  . SHOULDER ARTHROSCOPY  08/22/2012   Procedure: ARTHROSCOPY SHOULDER;  Surgeon: Marin Shutter, MD;  Location: [facility];  Service: Orthopedics;  Laterality: Left;  Left shoulder arthroscopic lavage and debridement  . SHOULDER ARTHROSCOPY  09/02/2012   Procedure: ARTHROSCOPY SHOULDER;  Surgeon: Marin Shutter, MD;  Location: [place];  Service: Orthopedics;  Laterality: Left;  Left shoulder arthroscopy irrigation and debridement  . SHOULDER ARTHROSCOPY W/ ROTATOR CUFF REPAIR  07/09/2012   left  . SHOULDER OPEN ROTATOR CUFF REPAIR Right 12/13/2017   Procedure: Right shoulder mini open rotator cuff repair;  Surgeon: Susa Day, MD;  [facility]: [facility];  Service: Orthopedics;  Laterality: Right;  Interscalene Block  . SKIN LESION EXCISION     Benign  . WISDOM TOOTH EXTRACTION  YRS AGO    There were no vitals filed for this visit.  Subjective Assessment - 02/26/18 1619    Subjective  Pt reports he has been starting to move his R arm more w/o assist from the L, plus he has been helping out at work more, and feels the shoulder is a little more sore now.    Pertinent History  12/13/17 - R shoulder: Mini open rotator cuff repair utilizing JuggerKnot suture anchors of supraspinatus and infraspinatus; Subacromial decompression; Acromioplasty    Patient Stated Goals  "to get full use of my arm again"    Currently in  Pain?  Yes    Pain Score  0-No pain up to 3-4/10 with some movements    Pain Location  Shoulder    Pain Orientation  Right                      [facility] Adult PT Treatment/Exercise - 02/26/18 1618      Exercises   Exercises  Shoulder      Shoulder Exercises: Supine   Flexion  Right;AROM;10 reps      Shoulder Exercises: Seated   Flexion  Right;AROM;10 reps    Abduction  Right;AROM;10 reps      Shoulder Exercises: Prone   Retraction  Right;10 reps;Strengthening    Retraction Weight (lbs)  1    Retraction Limitations  prone row - seated leaning forward pt unable to tolerate lying prone    Extension  Right;10 reps;Strengthening    Extension Weight (lbs)  1     Extension Limitations  I's - seated leaning forward  pt unable to tolerate lying prone    Horizontal ABduction 1  Right;10 reps    Horizontal ABduction 1 Limitations  T's - seated leaning forward  pt unable to tolerate prone      Shoulder Exercises: Sidelying   External Rotation  Right;AROM;10 reps    ABduction  Right;AROM;10 reps    ABduction Limitations  20-90 dg    Other Sidelying Exercises  R shoulder circles at 90 dg abduction CW/CCW x10 eac      Shoulder Exercises: Standing   Extension  Both;10 reps;Theraband;Strengthening    Theraband Level (Shoulder Extension)  Level 1 (Yellow)    Row  Both;10 reps;Theraband;Strengthening    Theraband Level (Shoulder Row)  Level 1 (Yellow)      Shoulder Exercises: Pulleys   Flexion  2 minutes    ABduction  2 minutes    ABduction Limitations  scaption      Shoulder Exercises: ROM/Strengthening   Wall Wash  R shoulder flexion, abduction & 60dg overhead arc x10      Shoulder Exercises: Isometric Strengthening   External Rotation  5X5";Theraband    Theraband Level (External Rotation)  Level 1 (Yellow)    External Rotation Limitations  neutral shoulder - step-outs    Internal Rotation  5X5";Theraband    Theraband Level (Internal Rotation)  Level 1 (Yellow)    Internal Rotation Limitations  neutral shoulder - step-outs      Modalities   Modalities  Cryotherapy      Cryotherapy   Number Minutes Cryotherapy  10 Minutes    Cryotherapy Location  Shoulder    Type of Cryotherapy  Ice pack      Manual Therapy   Manual Therapy  Soft tissue mobilization;Myofascial release    Soft tissue mobilization  R biceps & posterior capusule    Myofascial Release  TPR R biceps             PT Education - 02/26/18 1700    Education provided  Yes    Education Details  HEP update - supine/sidelying AROM    Person(s) Educated  Patient    Methods  Explanation;Demonstration;Handout    Comprehension  Verbalized understanding;Returned demonstration        PT Short Term Goals - 02/04/18 1046      PT SHORT TERM GOAL #1   Title  independent with initial HEP    Status  Achieved      PT SHORT TERM GOAL #2   Title  R  shoulder PROM equivalent to L shoulder AROM    Status  Achieved      PT SHORT TERM GOAL #3   Title  Pt will demonstrate neutral shoulder alignment to promote proper glenohumeral kinematics    Status  On-going        PT Long Term Goals - 01/28/18 1442      PT LONG TERM GOAL #1   Title  Independent with ongoing HEP    Status  On-going      PT LONG TERM GOAL #2   Title  R shoulder AROM equivalent to L shoulder w/o pain    Status  On-going      PT LONG TERM GOAL #3   Title  R shoulder strength >/= 4-/5 to 4/5 for improved function    Status  On-going      PT LONG TERM GOAL #4   Title  Pt will report no limitation in performance of ADLs or light household chores due to R shoulder pain, LOM or weakness    Status  On-going            Plan - 02/26/18 1709    Clinical Impression Statement  Pt reporting he has started attempting more AROM on his own with some increase in soreness. Introduced supine & seated AROM and progressed isometric strengthening to yellow TB step-outs with increased fatigue noted and mild post-exercise soreness, but pt able to demonstrate good GH mechanics with only minimal correction necessary from PT. Treatment concluded with ice pack to R shoulder for to reduce pain/inflammation.    Rehab Potential  Good    PT Treatment/Interventions  Patient/family education;ADLs/Self Care Home Management;Manual techniques;Passive range of motion;Scar mobilization;Therapeutic exercise;Therapeutic activities;Electrical Stimulation;Cryotherapy;Vasopneumatic Device;Moist Heat;Iontophoresis 4mg /ml Dexamethasone;Dry needling;Taping    PT Next Visit Plan  progress to AROM/strengthening as of 02/21/18    Consulted and Agree with Plan of Care  Patient       Patient will benefit from skilled therapeutic  intervention in order to improve the following deficits and impairments:  Decreased range of motion, Impaired flexibility, Pain, Impaired UE functional use, Postural dysfunction, Increased muscle spasms, Increased fascial restricitons, Decreased scar mobility, Decreased activity tolerance  Visit Diagnosis: Stiffness of right shoulder, not elsewhere classified  Acute pain of right shoulder  Muscle weakness (generalized)  Abnormal posture     Problem List Patient Active Problem List   Diagnosis Date Noted  . Complete rotator cuff tear 12/13/2017  . Claudication ([place]) 09/13/2017  . Allergy to pollen 11/06/2016  . Pulmonary embolism, bilateral ([place])   . Lower leg DVT (deep venous thromboembolism), acute ([place])   . Spinal stenosis of lumbar region at multiple levels 01/20/2015  . Screening, ischemic heart disease 01/03/2015  . Hypertriglyceridemia 08/17/2014  . Obesity 08/17/2014  . Type 2 diabetes mellitus without complication ([place]) 95/08/3266  . Hx of adenomatous colonic polyps 06/03/2014  . Essential hypertension, benign 09/13/2013  . Thrombocytopenia ([place]) 09/13/2013  . HTN (hypertension) 05/13/2013  . Hyperlipidemia   . Testicular hypofunction   . Biceps tendon rupture   . GERD (gastroesophageal reflux disease)   . Colon polyps     Percival Spanish, PT, MPT 02/26/2018, 6:12 PM  [facility] [address]  [place], [place], [address] Phone: [phone]   Fax:  [phone]  Name: Ian [name] MRN: [id number] Date of Birth: [birth year]

## 2018-02-28 ENCOUNTER — Ambulatory Visit: Payer: BLUE CROSS/BLUE SHIELD | Admitting: Physical Therapy

## 2018-02-28 ENCOUNTER — Encounter: Payer: Self-pay | Admitting: Physical Therapy

## 2018-02-28 DIAGNOSIS — R293 Abnormal posture: Secondary | ICD-10-CM

## 2018-02-28 DIAGNOSIS — M6281 Muscle weakness (generalized): Secondary | ICD-10-CM

## 2018-02-28 DIAGNOSIS — M25611 Stiffness of right shoulder, not elsewhere classified: Secondary | ICD-10-CM | POA: Diagnosis not present

## 2018-02-28 DIAGNOSIS — M25511 Pain in right shoulder: Secondary | ICD-10-CM

## 2018-02-28 NOTE — Therapy (Signed)
[facility] [address]  [place], [place], [address] Phone: [phone]   Fax:  [phone]  Physical Therapy Treatment  Patient Details  Name: Ian [name] MRN: [id number] Date of Birth: [birth year] Referring Provider: Lacie Draft, PA-C for Donnald Garre, MD   Encounter Date: 02/28/2018  PT End of Session - 02/28/18 1611    Visit Number  16    Number of Visits  24    Date for PT Re-Evaluation  03/29/18    Authorization Type  BCBS - VL: 90    PT Start Time  1611    PT Stop Time  1708    PT Time Calculation (min)  57 min    Activity Tolerance  Patient tolerated treatment well    Behavior During Therapy  [facility] for tasks assessed/performed       Past Medical History:  Diagnosis Date  . Allergy   . Arthritis   . Biceps tendon rupture    bilateral  . Clostridium difficile infection 2014  . Colon polyps   . Diabetes ([place])    Borderline/Pre-diabetes  . GERD (gastroesophageal reflux disease)   . History of DVT (deep vein thrombosis)   . History of pulmonary embolism   . Hyperlipidemia   . Hypertension   . Internal bleeding hemorrhoids 2012   "might bleed once/month; only when I strain"  . Internal prolapsed hemorrhoids   . Leg cramps   . Testicular hypofunction   . Vertigo   . Wart of face    Right cheek    Past Surgical History:  Procedure Laterality Date  . ANAL FISSURE REPAIR  06/24/2013  . APPENDECTOMY  2011  . COLONOSCOPY W/ BIOPSIES AND POLYPECTOMY  2012  . COLONSCOPY  2015  . EXCISIONAL HEMORRHOIDECTOMY    . FISSURECTOMY    . HEMORRHOID SURGERY  06/24/2013  . internal sphincterotomy  06-24-2013  . LUMBAR LAMINECTOMY/DECOMPRESSION MICRODISCECTOMY N/A 01/20/2015   Procedure: MICRO LUMBAR DECOMPRESSION L4-5/L3-4/L2-3;  Surgeon: Johnn Hai, MD;  Location: [facility];  Service: Orthopedics;  Laterality: N/A;  . SHOULDER ARTHROSCOPY  08/22/2012   Procedure: ARTHROSCOPY SHOULDER;  Surgeon: Marin Shutter, MD;  Location: [place];  Service: Orthopedics;  Laterality: Left;  Left shoulder arthroscopic lavage and debridement  . SHOULDER ARTHROSCOPY  09/02/2012   Procedure: ARTHROSCOPY SHOULDER;  Surgeon: Marin Shutter, MD;  Location: [place];  Service: Orthopedics;  Laterality: Left;  Left shoulder arthroscopy irrigation and debridement  . SHOULDER ARTHROSCOPY W/ ROTATOR CUFF REPAIR  07/09/2012   left  . SHOULDER OPEN ROTATOR CUFF REPAIR Right 12/13/2017   Procedure: Right shoulder mini open rotator cuff repair;  Surgeon: Susa Day, MD;  Location: [facility];  Service: Orthopedics;  Laterality: Right;  Interscalene Block  . SKIN LESION EXCISION     Benign  . WISDOM TOOTH EXTRACTION  YRS AGO    There were no vitals filed for this visit.  Subjective Assessment - 02/28/18 1612    Subjective  Pt reporting good tolerance for progression to AROM last visit with mild soreness later that evening, but no issues with updatde HEP.    Pertinent History  12/13/17 - R shoulder: Mini open rotator cuff repair utilizing JuggerKnot suture anchors of supraspinatus and infraspinatus; Subacromial decompression; Acromioplasty    Patient Stated Goals  "to get full use of my arm again"    Currently in Pain?  No/denies         [facility] PT Assessment -  02/28/18 1611      AROM   Right/Left Shoulder  Right    Right Shoulder Flexion  134 Degrees sitting, 144 dg supine    Right Shoulder ABduction  120 Degrees sitting, 140 supine    Right Shoulder Internal Rotation  66 Degrees supine at ~80 dg ABD    Right Shoulder External Rotation  72 Degrees supine at ~80 dg ABD      PROM   Right Shoulder Flexion  160 Degrees    Right Shoulder ABduction  158 Degrees    Right Shoulder Internal Rotation  84 Degrees    Right Shoulder External Rotation  78 Degrees                  [facility] Adult PT Treatment/Exercise - 02/28/18 1611      Exercises   Exercises  Shoulder      Shoulder Exercises: Supine   Flexion   Right;AROM;12 reps      Shoulder Exercises: Sidelying   External Rotation  Right;AROM;12 reps    ABduction  Right;AROM;12 reps      Shoulder Exercises: Standing   Extension  Both;15 reps;Theraband;Strengthening    Theraband Level (Shoulder Extension)  Level 1 (Yellow)    Row  Both;15 reps;Theraband;Strengthening    Theraband Level (Shoulder Row)  Level 1 (Yellow)      Shoulder Exercises: Pulleys   Flexion  3 minutes    ABduction  3 minutes    ABduction Limitations  scaption      Shoulder Exercises: Therapy Ball   Flexion  10 reps    Flexion Limitations  orange Pball on wall    ABduction  10 reps    ABduction Limitations  orange Pball on wall      Shoulder Exercises: ROM/Strengthening   Ball on Wall  R shoulder circles with small ball on wall at 90 dg flexion CW/CCW x15      Shoulder Exercises: Isometric Strengthening   External Rotation  Theraband 10x5"    Theraband Level (External Rotation)  Level 1 (Yellow)    External Rotation Limitations  neutral shoulder - step-outs    Internal Rotation  Theraband 10x5"    Theraband Level (Internal Rotation)  Level 1 (Yellow)    Internal Rotation Limitations  neutral shoulder - step-outs      Modalities   Modalities  Cryotherapy      Cryotherapy   Number Minutes Cryotherapy  10 Minutes    Cryotherapy Location  Shoulder    Type of Cryotherapy  Ice pack      Manual Therapy   Manual Therapy  Soft tissue mobilization;Myofascial release    Soft tissue mobilization  R posterior capsule    Myofascial Release  TPR R teres group             PT Education - 02/28/18 1700    Education provided  Yes    Education Details  HEP update - isometric step-outs with yellow TB (replacing wall isometrics) & yellow TB rows    Person(s) Educated  Patient    Methods  Explanation;Demonstration;Handout    Comprehension  Verbalized understanding;Returned demonstration       PT Short Term Goals - 02/04/18 1046      PT SHORT TERM GOAL #1    Title  independent with initial HEP    Status  Achieved      PT SHORT TERM GOAL #2   Title  R shoulder PROM equivalent to L shoulder AROM    Status  Achieved      PT SHORT TERM GOAL #3   Title  Pt will demonstrate neutral shoulder alignment to promote proper glenohumeral kinematics    Status  On-going        PT Long Term Goals - 01/28/18 1442      PT LONG TERM GOAL #1   Title  Independent with ongoing HEP    Status  On-going      PT LONG TERM GOAL #2   Title  R shoulder AROM equivalent to L shoulder w/o pain    Status  On-going      PT LONG TERM GOAL #3   Title  R shoulder strength >/= 4-/5 to 4/5 for improved function    Status  On-going      PT LONG TERM GOAL #4   Title  Pt will report no limitation in performance of ADLs or light household chores due to R shoulder pain, LOM or weakness    Status  On-going            Plan - 02/28/18 1615    Clinical Impression Statement  Pt reporting good tolerance for progression to AROM with only expected muscle soreness reported. Full R shoulder PROM available but supine AROM lagging by 10-20 dg due to weakness. Continued progression of upright ROM and stabilization exercises with some fatigue noted but otherwise good tolerance.    Rehab Potential  Good    PT Treatment/Interventions  Patient/family education;ADLs/Self Care Home Management;Manual techniques;Passive range of motion;Scar mobilization;Therapeutic exercise;Therapeutic activities;Electrical Stimulation;Cryotherapy;Vasopneumatic Device;Moist Heat;Iontophoresis 4mg /ml Dexamethasone;Dry needling;Taping    PT Next Visit Plan  progress to AROM/strengthening as of 02/21/18    Consulted and Agree with Plan of Care  Patient       Patient will benefit from skilled therapeutic intervention in order to improve the following deficits and impairments:  Decreased range of motion, Impaired flexibility, Pain, Impaired UE functional use, Postural dysfunction, Increased muscle spasms,  Increased fascial restricitons, Decreased scar mobility, Decreased activity tolerance  Visit Diagnosis: Stiffness of right shoulder, not elsewhere classified  Acute pain of right shoulder  Muscle weakness (generalized)  Abnormal posture     Problem List Patient Active Problem List   Diagnosis Date Noted  . Complete rotator cuff tear 12/13/2017  . Claudication ([place]) 09/13/2017  . Allergy to pollen 11/06/2016  . Pulmonary embolism, bilateral ([place])   . Lower leg DVT (deep venous thromboembolism), acute ([place])   . Spinal stenosis of lumbar region at multiple levels 01/20/2015  . Screening, ischemic heart disease 01/03/2015  . Hypertriglyceridemia 08/17/2014  . Obesity 08/17/2014  . Type 2 diabetes mellitus without complication ([place]) 46/56/8127  . Hx of adenomatous colonic polyps 06/03/2014  . Essential hypertension, benign 09/13/2013  . Thrombocytopenia ([place]) 09/13/2013  . HTN (hypertension) 05/13/2013  . Hyperlipidemia   . Testicular hypofunction   . Biceps tendon rupture   . GERD (gastroesophageal reflux disease)   . Colon polyps     Percival Spanish, PT, MPT 02/28/2018, 5:23 PM  [facility] [address]  [place], [place], [address] Phone: [phone]   Fax:  [phone]  Name: Ian [name] MRN: [id number] Date of Birth: 1961-03-18

## 2018-03-05 ENCOUNTER — Ambulatory Visit: Payer: BLUE CROSS/BLUE SHIELD | Admitting: Physical Therapy

## 2018-03-05 ENCOUNTER — Encounter: Payer: Self-pay | Admitting: Physical Therapy

## 2018-03-05 DIAGNOSIS — M25611 Stiffness of right shoulder, not elsewhere classified: Secondary | ICD-10-CM | POA: Diagnosis not present

## 2018-03-05 DIAGNOSIS — R293 Abnormal posture: Secondary | ICD-10-CM

## 2018-03-05 DIAGNOSIS — M25511 Pain in right shoulder: Secondary | ICD-10-CM

## 2018-03-05 DIAGNOSIS — M6281 Muscle weakness (generalized): Secondary | ICD-10-CM

## 2018-03-05 NOTE — Therapy (Signed)
[facility] [address]  [place], [place], [address] Phone: [phone]   Fax:  [phone]  Physical Therapy Treatment  Patient Details  Name: Ian [name] MRN: [id number] Date of Birth: 08/03/1961 Referring Provider: Lacie Draft, PA-C for Donnald Garre, MD   Encounter Date: 03/05/2018  PT End of Session - 03/05/18 1610    Visit Number  17    Number of Visits  24    Date for PT Re-Evaluation  03/29/18    Authorization Type  BCBS - VL: 5    PT Start Time  1540    PT Stop Time  1712    PT Time Calculation (min)  62 min    Activity Tolerance  Patient tolerated treatment well    Behavior During Therapy  [facility] for tasks assessed/performed       Past Medical History:  Diagnosis Date  . Allergy   . Arthritis   . Biceps tendon rupture    bilateral  . Clostridium difficile infection 2014  . Colon polyps   . Diabetes ([place])    Borderline/Pre-diabetes  . GERD (gastroesophageal reflux disease)   . History of DVT (deep vein thrombosis)   . History of pulmonary embolism   . Hyperlipidemia   . Hypertension   . Internal bleeding hemorrhoids 2012   "might bleed once/month; only when I strain"  . Internal prolapsed hemorrhoids   . Leg cramps   . Testicular hypofunction   . Vertigo   . Wart of face    Right cheek    Past Surgical History:  Procedure Laterality Date  . ANAL FISSURE REPAIR  06/24/2013  . APPENDECTOMY  2011  . COLONOSCOPY W/ BIOPSIES AND POLYPECTOMY  2012  . COLONSCOPY  2015  . EXCISIONAL HEMORRHOIDECTOMY    . FISSURECTOMY    . HEMORRHOID SURGERY  06/24/2013  . internal sphincterotomy  06-24-2013  . LUMBAR LAMINECTOMY/DECOMPRESSION MICRODISCECTOMY N/A 01/20/2015   Procedure: MICRO LUMBAR DECOMPRESSION L4-5/L3-4/L2-3;  Surgeon: Johnn Hai, MD;  [facility]: [facility];  Service: Orthopedics;  Laterality: N/A;  . SHOULDER ARTHROSCOPY  08/22/2012   Procedure: ARTHROSCOPY SHOULDER;  Surgeon: Marin Shutter, MD;  Location: [place];  Service: Orthopedics;  Laterality: Left;  Left shoulder arthroscopic lavage and debridement  . SHOULDER ARTHROSCOPY  09/02/2012   Procedure: ARTHROSCOPY SHOULDER;  Surgeon: Marin Shutter, MD;  Location: [place];  Service: Orthopedics;  Laterality: Left;  Left shoulder arthroscopy irrigation and debridement  . SHOULDER ARTHROSCOPY W/ ROTATOR CUFF REPAIR  07/09/2012   left  . SHOULDER OPEN ROTATOR CUFF REPAIR Right 12/13/2017   Procedure: Right shoulder mini open rotator cuff repair;  Surgeon: Susa Day, MD;  Location: [facility];  Service: Orthopedics;  Laterality: Right;  Interscalene Block  . SKIN LESION EXCISION     Benign  . WISDOM TOOTH EXTRACTION  YRS AGO    There were no vitals filed for this visit.  Subjective Assessment - 03/05/18 1610    Subjective  Pt reporting he worked today - sitting on a tractor reaching back and had no difficulty or pain.    Pertinent History  12/13/17 - R shoulder: Mini open rotator cuff repair utilizing JuggerKnot suture anchors of supraspinatus and infraspinatus; Subacromial decompression; Acromioplasty    Patient Stated Goals  "to get full use of my arm again"    Currently in Pain?  No/denies         [facility] PT Assessment - 03/05/18 1610  Assessment   Medical Diagnosis  R shoulder - Mini open rotator cuff repair utilizing JuggerKnot suture anchors of supraspinatus and infraspinatus; Subacromial decompression; Acromioplasty    Referring Provider  Lacie Draft, PA-C for Donnald Garre, MD    Next MD Visit  03/07/18      AROM   Overall AROM Comments  seated    Right Shoulder Flexion  146 Degrees    Right Shoulder ABduction  137 Degrees    Right Shoulder Internal Rotation  71 Degrees    Right Shoulder External Rotation  72 Degrees      PROM   Right Shoulder Flexion  169 Degrees    Right Shoulder ABduction  158 Degrees    Right Shoulder Internal Rotation  82 Degrees    Right Shoulder External Rotation  82 Degrees       Strength   Strength Assessment Site  Shoulder    Right/Left Shoulder  Right;Left    Right Shoulder Flexion  4/5    Right Shoulder ABduction  4-/5    Right Shoulder Internal Rotation  4-/5    Right Shoulder External Rotation  3+/5    Left Shoulder Flexion  4+/5    Left Shoulder ABduction  4+/5    Left Shoulder Internal Rotation  4+/5    Left Shoulder External Rotation  4+/5                  [facility] Adult PT Treatment/Exercise - 03/05/18 1610      Exercises   Exercises  Shoulder      Shoulder Exercises: Supine   Protraction  Right;12 reps;Weights;Strengthening    Protraction Weight (lbs)  3    Flexion  Right;15 reps;Weights;Strengthening    Shoulder Flexion Weight (lbs)  1      Shoulder Exercises: Sidelying   External Rotation  Right;10 reps;Strengthening    External Rotation Weight (lbs)  1    External Rotation Limitations  cues for scap retraction    ABduction  Right;10 reps;Weights;Strengthening    ABduction Weight (lbs)  1      Shoulder Exercises: Standing   External Rotation  Right;10 reps;Theraband;Strengthening    Theraband Level (Shoulder External Rotation)  Level 1 (Yellow)    External Rotation Limitations  neutral shoulder    Internal Rotation  Right;10 reps;Theraband;Strengthening    Theraband Level (Shoulder Internal Rotation)  Level 1 (Yellow)    Internal Rotation Limitations  neutral shoulder    Extension  Both;10 reps;Theraband;Strengthening    Theraband Level (Shoulder Extension)  Level 2 (Red)    Row  Both;10 reps;Theraband;Strengthening    Theraband Level (Shoulder Row)  Level 2 (Red)    Other Standing Exercises  R shoulder flexion & scaption cabinet reaches 1# x10 each      Shoulder Exercises: Pulleys   Flexion  3 minutes    ABduction  3 minutes    ABduction Limitations  scaption      Modalities   Modalities  Vasopneumatic      Vasopneumatic   Number Minutes Vasopneumatic   15 minutes    Vasopnuematic Location   Shoulder     Vasopneumatic Pressure  Low    Vasopneumatic Temperature   lowest               PT Short Term Goals - 03/05/18 1614      PT SHORT TERM GOAL #1   Title  independent with initial HEP    Status  Achieved      PT SHORT  TERM GOAL #2   Title  R shoulder PROM equivalent to L shoulder AROM    Status  Achieved      PT SHORT TERM GOAL #3   Title  Pt will demonstrate neutral shoulder alignment to promote proper glenohumeral kinematics    Status  Achieved        PT Long Term Goals - 03/05/18 1614      PT LONG TERM GOAL #1   Title  Independent with ongoing HEP    Status  Partially Met      PT LONG TERM GOAL #2   Title  R shoulder AROM equivalent to L shoulder w/o pain    Status  On-going      PT LONG TERM GOAL #3   Title  R shoulder strength >/= 4-/5 to 4/5 for improved function    Status  Partially Met      PT LONG TERM GOAL #4   Title  Pt will report no limitation in performance of ADLs or light household chores due to R shoulder pain, LOM or weakness    Status  Partially Met            Plan - 03/05/18 1614    Clinical Impression Statement  Ian [name] progressing well following initiation of AROM and light strengthening last week. R shoulder AROM steadily improving, although seated AROM still limited compared to supine due to weakness. Pt reporting improving functional use of R shoulder w/o pain limiting, but still limited due to weakness and fatigue. Pt demonstrates excellent potential to further benefit from skilled PT to restore full AROM against gravity as well as functional strength in all planes to allow return to normal daily and work tasks.    Rehab Potential  Good    PT Treatment/Interventions  Patient/family education;ADLs/Self Care Home Management;Manual techniques;Passive range of motion;Scar mobilization;Therapeutic exercise;Therapeutic activities;Electrical Stimulation;Cryotherapy;Vasopneumatic Device;Moist Heat;Iontophoresis 65m/ml Dexamethasone;Dry  needling;Taping    PT Next Visit Plan  progress AROM/strengthening as tolerated    Consulted and Agree with Plan of Care  Patient       Patient will benefit from skilled therapeutic intervention in order to improve the following deficits and impairments:  Decreased range of motion, Impaired flexibility, Pain, Impaired UE functional use, Postural dysfunction, Increased muscle spasms, Increased fascial restricitons, Decreased scar mobility, Decreased activity tolerance  Visit Diagnosis: Stiffness of right shoulder, not elsewhere classified  Acute pain of right shoulder  Muscle weakness (generalized)  Abnormal posture     Problem List Patient Active Problem List   Diagnosis Date Noted  . Complete rotator cuff tear 12/13/2017  . Claudication ([place] 09/13/2017  . Allergy to pollen 11/06/2016  . Pulmonary embolism, bilateral ([place]   . Lower leg DVT (deep venous thromboembolism), acute ([place]   . Spinal stenosis of lumbar region at multiple levels 01/20/2015  . Screening, ischemic heart disease 01/03/2015  . Hypertriglyceridemia 08/17/2014  . Obesity 08/17/2014  . Type 2 diabetes mellitus without complication ([place] 091/63/8466 . Hx of adenomatous colonic polyps 06/03/2014  . Essential hypertension, benign 09/13/2013  . Thrombocytopenia ([place] 09/13/2013  . HTN (hypertension) 05/13/2013  . Hyperlipidemia   . Testicular hypofunction   . Biceps tendon rupture   . GERD (gastroesophageal reflux disease)   . Colon polyps     JPercival Spanish PT, MPT 03/05/2018, 6:06 PM  [facility] [place] [address]Phone: [phone]  Fax:  [phone] Name: DAdger  SATVIK PARCO MRN: [id number] Date of Birth: [birth year]

## 2018-03-07 ENCOUNTER — Ambulatory Visit: Payer: BLUE CROSS/BLUE SHIELD | Admitting: Physical Therapy

## 2018-03-07 ENCOUNTER — Encounter: Payer: Self-pay | Admitting: Physical Therapy

## 2018-03-07 DIAGNOSIS — M25611 Stiffness of right shoulder, not elsewhere classified: Secondary | ICD-10-CM | POA: Diagnosis not present

## 2018-03-07 DIAGNOSIS — R293 Abnormal posture: Secondary | ICD-10-CM

## 2018-03-07 DIAGNOSIS — M6281 Muscle weakness (generalized): Secondary | ICD-10-CM

## 2018-03-07 DIAGNOSIS — M25511 Pain in right shoulder: Secondary | ICD-10-CM

## 2018-03-07 NOTE — Therapy (Addendum)
[facility] [address]  [place], [place], [address] Phone: [phone]   Fax:  [phone]  Physical Therapy Treatment  Patient Details  Name: Ian [name] MRN: [id number] Date of Birth: Dec 16, 1961 Referring Provider: Lacie Draft, PA-C for Donnald Garre, MD   Encounter Date: 03/07/2018  PT End of Session - 03/07/18 1615    Visit Number  18    Number of Visits  24    Date for PT Re-Evaluation  03/29/18    Authorization Type  BCBS - VL: 48    PT Start Time  6712    PT Stop Time  1714    PT Time Calculation (min)  59 min    Activity Tolerance  Patient tolerated treatment well    Behavior During Therapy  [facility] for tasks assessed/performed       Past Medical History:  Diagnosis Date  . Allergy   . Arthritis   . Biceps tendon rupture    bilateral  . Clostridium difficile infection 2014  . Colon polyps   . Diabetes ([place])    Borderline/Pre-diabetes  . GERD (gastroesophageal reflux disease)   . History of DVT (deep vein thrombosis)   . History of pulmonary embolism   . Hyperlipidemia   . Hypertension   . Internal bleeding hemorrhoids 2012   "might bleed once/month; only when I strain"  . Internal prolapsed hemorrhoids   . Leg cramps   . Testicular hypofunction   . Vertigo   . Wart of face    Right cheek    Past Surgical History:  Procedure Laterality Date  . ANAL FISSURE REPAIR  06/24/2013  . APPENDECTOMY  2011  . COLONOSCOPY W/ BIOPSIES AND POLYPECTOMY  2012  . COLONSCOPY  2015  . EXCISIONAL HEMORRHOIDECTOMY    . FISSURECTOMY    . HEMORRHOID SURGERY  06/24/2013  . internal sphincterotomy  06-24-2013  . LUMBAR LAMINECTOMY/DECOMPRESSION MICRODISCECTOMY N/A 01/20/2015   Procedure: MICRO LUMBAR DECOMPRESSION L4-5/L3-4/L2-3;  Surgeon: Johnn Hai, MD;  Location: [facility];  Service: Orthopedics;  Laterality: N/A;  . SHOULDER ARTHROSCOPY  08/22/2012   Procedure: ARTHROSCOPY SHOULDER;  Surgeon: Marin Shutter, MD;  Location: [place];  Service: Orthopedics;  Laterality: Left;  Left shoulder arthroscopic lavage and debridement  . SHOULDER ARTHROSCOPY  09/02/2012   Procedure: ARTHROSCOPY SHOULDER;  Surgeon: Marin Shutter, MD;  Location: [place];  Service: Orthopedics;  Laterality: Left;  Left shoulder arthroscopy irrigation and debridement  . SHOULDER ARTHROSCOPY W/ ROTATOR CUFF REPAIR  07/09/2012   left  . SHOULDER OPEN ROTATOR CUFF REPAIR Right 12/13/2017   Procedure: Right shoulder mini open rotator cuff repair;  Surgeon: Susa Day, MD;  Location: [facility];  Service: Orthopedics;  Laterality: Right;  Interscalene Block  . SKIN LESION EXCISION     Benign  . WISDOM TOOTH EXTRACTION  YRS AGO    There were no vitals filed for this visit.  Subjective Assessment - 03/07/18 1619    Subjective  Pt saw MD this morning and states he got a good report from the MD. No pain today, just some soreness.    Pertinent History  12/13/17 - R shoulder: Mini open rotator cuff repair utilizing JuggerKnot suture anchors of supraspinatus and infraspinatus; Subacromial decompression; Acromioplasty    Patient Stated Goals  "to get full use of my arm again"    Currently in Pain?  No/denies         [facility] PT Assessment -  03/07/18 1615      Assessment   Next MD Visit  05/06/18                  [facility] Adult PT Treatment/Exercise - 03/07/18 1615      Exercises   Exercises  Shoulder      Shoulder Exercises: Supine   Protraction  Right;10 reps;Weights;Strengthening    Protraction Weight (lbs)  4    Flexion  Right;15 reps;Weights;Strengthening    Shoulder Flexion Weight (lbs)  2      Shoulder Exercises: Sidelying   External Rotation  Right;12 reps    External Rotation Weight (lbs)  1    External Rotation Limitations  cues for scap retraction      Shoulder Exercises: Standing   External Rotation  Right;15 reps;Theraband;Strengthening    Theraband Level (Shoulder External Rotation)  Level 1 (Yellow)     External Rotation Limitations  neutral shoulder    Internal Rotation  Right;15 reps;Theraband;Strengthening    Theraband Level (Shoulder Internal Rotation)  Level 1 (Yellow)    Internal Rotation Limitations  neutral shoulder    Flexion  Right;10 reps;Weights;Strengthening    Shoulder Flexion Weight (lbs)  1      Shoulder Exercises: ROM/Strengthening   UBE (Upper Arm Bike)  L 1.0 fwd/back x 3' each    Wall Pushups  10 reps    Ball on Wall  R shoulder circles with small ball on wall at 90 dg flexion & scaption  CW/CCW x15 each    Rhythmic Stabilization, Supine  R shoulder at 90 dg flexion 2# 3x30"      Modalities   Modalities  Cryotherapy      Cryotherapy   Number Minutes Cryotherapy  10 Minutes    Cryotherapy Location  Shoulder    Type of Cryotherapy  Ice pack      Manual Therapy   Manual Therapy  Soft tissue mobilization;Myofascial release    Manual therapy comments  L sidelying    Soft tissue mobilization  R ant/mid deltoid    Myofascial Release  manual TPR R middle deltoid             PT Education - 03/07/18 1700    Education provided  Yes    Education Details  HEP update - yellow TB IR/ER    Person(s) Educated  Patient    Methods  Explanation;Demonstration;Handout    Comprehension  Verbalized understanding;Returned demonstration       PT Short Term Goals - 03/05/18 1614      PT SHORT TERM GOAL #1   Title  independent with initial HEP    Status  Achieved      PT SHORT TERM GOAL #2   Title  R shoulder PROM equivalent to L shoulder AROM    Status  Achieved      PT SHORT TERM GOAL #3   Title  Pt will demonstrate neutral shoulder alignment to promote proper glenohumeral kinematics    Status  Achieved        PT Long Term Goals - 03/05/18 1614      PT LONG TERM GOAL #1   Title  Independent with ongoing HEP    Status  Partially Met      PT LONG TERM GOAL #2   Title  R shoulder AROM equivalent to L shoulder w/o pain    Status  On-going      PT LONG  TERM GOAL #3   Title  R shoulder strength >/=  4-/5 to 4/5 for improved function    Status  Partially Met      PT LONG TERM GOAL #4   Title  Pt will report no limitation in performance of ADLs or light household chores due to R shoulder pain, LOM or weakness    Status  Partially Met            Plan - 03/07/18 1623    Clinical Impression Statement  MD pleased with progress and cautioned pt to avoid certain motions (abduction in IR & reaching behind) "for the rest of his life" to reduce risk of re-tearing RTC. Pt noting mild increased soreness from working more and keeping up with his HEP, but otherwise denies pain. Good tolerance for progression of AROM and strengthening exercises, but noting anterior shoulder discomfort with sidelying ER - decreased scapular retraction activation resulting in tightening of anterior capsule causing impingement, thus manual facilitation employed to engage scapular retractors with better tolerance reported.    Rehab Potential  Good    PT Treatment/Interventions  Patient/family education;ADLs/Self Care Home Management;Manual techniques;Passive range of motion;Scar mobilization;Therapeutic exercise;Therapeutic activities;Electrical Stimulation;Cryotherapy;Vasopneumatic Device;Moist Heat;Iontophoresis 11m/ml Dexamethasone;Dry needling;Taping    PT Next Visit Plan  progress AROM/strengthening as tolerated    Consulted and Agree with Plan of Care  Patient       Patient will benefit from skilled therapeutic intervention in order to improve the following deficits and impairments:  Decreased range of motion, Impaired flexibility, Pain, Impaired UE functional use, Postural dysfunction, Increased muscle spasms, Increased fascial restricitons, Decreased scar mobility, Decreased activity tolerance  Visit Diagnosis: Stiffness of right shoulder, not elsewhere classified  Acute pain of right shoulder  Muscle weakness (generalized)  Abnormal posture     Problem  List Patient Active Problem List   Diagnosis Date Noted  . Complete rotator cuff tear 12/13/2017  . Claudication ([place] 09/13/2017  . Allergy to pollen 11/06/2016  . Pulmonary embolism, bilateral ([place]   . Lower leg DVT (deep venous thromboembolism), acute ([place]   . Spinal stenosis of lumbar region at multiple levels 01/20/2015  . Screening, ischemic heart disease 01/03/2015  . Hypertriglyceridemia 08/17/2014  . Obesity 08/17/2014  . Type 2 diabetes mellitus without complication ([place] 043/14/2767 . Hx of adenomatous colonic polyps 06/03/2014  . Essential hypertension, benign 09/13/2013  . Thrombocytopenia ([place] 09/13/2013  . HTN (hypertension) 05/13/2013  . Hyperlipidemia   . Testicular hypofunction   . Biceps tendon rupture   . GERD (gastroesophageal reflux disease)   . Colon polyps     JPercival Spanish PT, MPT 03/07/2018, 7:38 PM  [facility] [place] [address]Phone: [phone]  Fax:  [phone] Name: Ian SANDOZMRN: [id number] of Birth: 907/20/1962

## 2018-03-12 ENCOUNTER — Ambulatory Visit: Payer: BLUE CROSS/BLUE SHIELD | Admitting: Physical Therapy

## 2018-03-12 ENCOUNTER — Encounter: Payer: Self-pay | Admitting: Physical Therapy

## 2018-03-12 DIAGNOSIS — M25611 Stiffness of right shoulder, not elsewhere classified: Secondary | ICD-10-CM

## 2018-03-12 DIAGNOSIS — M6281 Muscle weakness (generalized): Secondary | ICD-10-CM

## 2018-03-12 DIAGNOSIS — R293 Abnormal posture: Secondary | ICD-10-CM

## 2018-03-12 DIAGNOSIS — M25511 Pain in right shoulder: Secondary | ICD-10-CM

## 2018-03-12 NOTE — Therapy (Signed)
[facility] [address]  [place], [place], [address] Phone: [phone]   Fax:  [phone]  Physical Therapy Treatment  Patient Details  Name: Ian [name] MRN: [id number] Date of Birth: 1961/12/16 Referring Provider: Lacie Draft, PA-C for Donnald Garre, MD   Encounter Date: 03/12/2018  PT End of Session - 03/12/18 1609    Visit Number  19    Number of Visits  24    Date for PT Re-Evaluation  03/29/18    Authorization Type  BCBS - VL: 37    PT Start Time  6834    PT Stop Time  1703    PT Time Calculation (min)  54 min    Activity Tolerance  Patient tolerated treatment well    Behavior During Therapy  [facility] for tasks assessed/performed       Past Medical History:  Diagnosis Date  . Allergy   . Arthritis   . Biceps tendon rupture    bilateral  . Clostridium difficile infection 2014  . Colon polyps   . Diabetes ([place])    Borderline/Pre-diabetes  . GERD (gastroesophageal reflux disease)   . History of DVT (deep vein thrombosis)   . History of pulmonary embolism   . Hyperlipidemia   . Hypertension   . Internal bleeding hemorrhoids 2012   "might bleed once/month; only when I strain"  . Internal prolapsed hemorrhoids   . Leg cramps   . Testicular hypofunction   . Vertigo   . Wart of face    Right cheek    Past Surgical History:  Procedure Laterality Date  . ANAL FISSURE REPAIR  06/24/2013  . APPENDECTOMY  2011  . COLONOSCOPY W/ BIOPSIES AND POLYPECTOMY  2012  . COLONSCOPY  2015  . EXCISIONAL HEMORRHOIDECTOMY    . FISSURECTOMY    . HEMORRHOID SURGERY  06/24/2013  . internal sphincterotomy  06-24-2013  . LUMBAR LAMINECTOMY/DECOMPRESSION MICRODISCECTOMY N/A 01/20/2015   Procedure: MICRO LUMBAR DECOMPRESSION L4-5/L3-4/L2-3;  Surgeon: Johnn Hai, MD;  [facility];  Service: Orthopedics;  Laterality: N/A;  . SHOULDER ARTHROSCOPY  08/22/2012   Procedure: ARTHROSCOPY SHOULDER;  Surgeon: Marin Shutter, MD;  Location: [place];  Service: Orthopedics;  Laterality: Left;  Left shoulder arthroscopic lavage and debridement  . SHOULDER ARTHROSCOPY  09/02/2012   Procedure: ARTHROSCOPY SHOULDER;  Surgeon: Marin Shutter, MD;  Location: [place];  Service: Orthopedics;  Laterality: Left;  Left shoulder arthroscopy irrigation and debridement  . SHOULDER ARTHROSCOPY W/ ROTATOR CUFF REPAIR  07/09/2012   left  . SHOULDER OPEN ROTATOR CUFF REPAIR Right 12/13/2017   Procedure: Right shoulder mini open rotator cuff repair;  Surgeon: Susa Day, MD;  [facility];  Service: Orthopedics;  Laterality: Right;  Interscalene Block  . SKIN LESION EXCISION     Benign  . WISDOM TOOTH EXTRACTION  YRS AGO    There were no vitals filed for this visit.  Subjective Assessment - 03/12/18 1611    Subjective  Pt reporting increased pain following last visit that lingered to some degree into the weekend, but now resolved.    Pertinent History  12/13/17 - R shoulder: Mini open rotator cuff repair utilizing JuggerKnot suture anchors of supraspinatus and infraspinatus; Subacromial decompression; Acromioplasty    Patient Stated Goals  "to get full use of my arm again"    Currently in Pain?  No/denies  [facility] Adult PT Treatment/Exercise - 03/12/18 1609      Exercises   Exercises  Shoulder      Shoulder Exercises: Supine   Protraction  Right;15 reps;Weights;Strengthening    Protraction Weight (lbs)  4      Shoulder Exercises: Prone   Extension  Both;15 reps;Weights;Strengthening    Extension Weight (lbs)  1    Extension Limitations  I's - standing leaning forward over green Pball pt unable to tolerate lying prone    External Rotation  Both;10 reps;Strengthening    External Rotation Limitations  W's - standing leaning forward over green Pball    Horizontal ABduction 1  Both;15 reps;Strengthening    Horizontal ABduction 1 Limitations  T's - standing leaning forward over green  Pball    Horizontal ABduction 2  Both;10 reps;Strengthening    Horizontal ABduction 2 Limitations  Y's - standing leaning forward over green Pball      Shoulder Exercises: Sidelying   External Rotation  Right;15 reps;Weights;Strengthening    External Rotation Weight (lbs)  1    External Rotation Limitations  better scap retra      Shoulder Exercises: Standing   Other Standing Exercises  R shoulder flexion (2#) & scaption (1#) cabinet reaches x10 each      Shoulder Exercises: Therapy Ball   Flexion  Both;15 reps    Flexion Limitations  1# on R wrist, orange Pball on wall; slight flexion stretch at top of motion    Scaption  Right;15 reps    Scaption Limitations  1# on R wrist, orange Pball on wall      Shoulder Exercises: ROM/Strengthening   UBE (Upper Arm Bike)  L 1.5 fwd/back x 3' each    Wall Pushups  15 reps    Rhythmic Stabilization, Supine  R shoulder at 90 dg flexion 3# 3x30"      Modalities   Modalities  Cryotherapy      Cryotherapy   Number Minutes Cryotherapy  10 Minutes    Cryotherapy Location  Shoulder    Type of Cryotherapy  Ice pack               PT Short Term Goals - 03/05/18 1614      PT SHORT TERM GOAL #1   Title  independent with initial HEP    Status  Achieved      PT SHORT TERM GOAL #2   Title  R shoulder PROM equivalent to L shoulder AROM    Status  Achieved      PT SHORT TERM GOAL #3   Title  Pt will demonstrate neutral shoulder alignment to promote proper glenohumeral kinematics    Status  Achieved        PT Long Term Goals - 03/05/18 1614      PT LONG TERM GOAL #1   Title  Independent with ongoing HEP    Status  Partially Met      PT LONG TERM GOAL #2   Title  R shoulder AROM equivalent to L shoulder w/o pain    Status  On-going      PT LONG TERM GOAL #3   Title  R shoulder strength >/= 4-/5 to 4/5 for improved function    Status  Partially Met      PT LONG TERM GOAL #4   Title  Pt will report no limitation in performance  of ADLs or light household chores due to R shoulder pain, LOM or weakness    Status  Partially Met            Plan - 03/12/18 1613    Clinical Impression Statement  Pt reporting some increased pain following last therapy session which persisted into the weekend but has now subsided - pt feels that this was a combination of therapy exercise progression and the wasy he had been reaching back with work activities. Good tolerance for exercise progression today but still with limited scapular activiation with some exercises, although better with sidelying ER.    Rehab Potential  Good    PT Treatment/Interventions  Patient/family education;ADLs/Self Care Home Management;Manual techniques;Passive range of motion;Scar mobilization;Therapeutic exercise;Therapeutic activities;Electrical Stimulation;Cryotherapy;Vasopneumatic Device;Moist Heat;Iontophoresis 28m/ml Dexamethasone;Dry needling;Taping    PT Next Visit Plan  progress AROM/strengthening as tolerated    Consulted and Agree with Plan of Care  Patient       Patient will benefit from skilled therapeutic intervention in order to improve the following deficits and impairments:  Decreased range of motion, Impaired flexibility, Pain, Impaired UE functional use, Postural dysfunction, Increased muscle spasms, Increased fascial restricitons, Decreased scar mobility, Decreased activity tolerance  Visit Diagnosis: Stiffness of right shoulder, not elsewhere classified  Acute pain of right shoulder  Muscle weakness (generalized)  Abnormal posture     Problem List Patient Active Problem List   Diagnosis Date Noted  . Complete rotator cuff tear 12/13/2017  . Claudication ([place] 09/13/2017  . Allergy to pollen 11/06/2016  . Pulmonary embolism, bilateral ([place]   . Lower leg DVT (deep venous thromboembolism), acute ([place]   . Spinal stenosis of lumbar region at multiple levels 01/20/2015  . Screening, ischemic heart disease 01/03/2015  .  Hypertriglyceridemia 08/17/2014  . Obesity 08/17/2014  . Type 2 diabetes mellitus without complication ([place] 073/56/7014 . Hx of adenomatous colonic polyps 06/03/2014  . Essential hypertension, benign 09/13/2013  . Thrombocytopenia ([place] 09/13/2013  . HTN (hypertension) 05/13/2013  . Hyperlipidemia   . Testicular hypofunction   . Biceps tendon rupture   . GERD (gastroesophageal reflux disease)   . Colon polyps     JPercival Spanish PT, MPT 03/12/2018, 5:52 PM  [facility] [place] [place] [address]Phone: [phone]  Fax:  [phone] Name: Ian RABBANIMRN: [id number] of Birth: 901/21/1962

## 2018-03-14 ENCOUNTER — Encounter: Payer: Self-pay | Admitting: Physical Therapy

## 2018-03-14 ENCOUNTER — Ambulatory Visit: Payer: BLUE CROSS/BLUE SHIELD | Admitting: Physical Therapy

## 2018-03-14 DIAGNOSIS — M25611 Stiffness of right shoulder, not elsewhere classified: Secondary | ICD-10-CM | POA: Diagnosis not present

## 2018-03-14 DIAGNOSIS — M25511 Pain in right shoulder: Secondary | ICD-10-CM

## 2018-03-14 DIAGNOSIS — R293 Abnormal posture: Secondary | ICD-10-CM

## 2018-03-14 DIAGNOSIS — M6281 Muscle weakness (generalized): Secondary | ICD-10-CM

## 2018-03-14 NOTE — Therapy (Signed)
[facility] [address]  [place], [place], [address] Phone: [phone]   Fax:  [phone]  Physical Therapy Treatment  Patient Details  Name: Ian [name] MRN: [id number] Date of Birth: October 28, 1961 Referring Provider: Lacie Draft, PA-C for Donnald Garre, MD   Encounter Date: 03/14/2018  PT End of Session - 03/14/18 1616    Visit Number  20    Number of Visits  24    Date for PT Re-Evaluation  03/29/18    Authorization Type  BCBS - VL: 48    PT Start Time  3532    PT Stop Time  1711    PT Time Calculation (min)  55 min    Activity Tolerance  Patient tolerated treatment well    Behavior During Therapy  [facility] for tasks assessed/performed       Past Medical History:  Diagnosis Date  . Allergy   . Arthritis   . Biceps tendon rupture    bilateral  . Clostridium difficile infection 2014  . Colon polyps   . Diabetes ([place])    Borderline/Pre-diabetes  . GERD (gastroesophageal reflux disease)   . History of DVT (deep vein thrombosis)   . History of pulmonary embolism   . Hyperlipidemia   . Hypertension   . Internal bleeding hemorrhoids 2012   "might bleed once/month; only when I strain"  . Internal prolapsed hemorrhoids   . Leg cramps   . Testicular hypofunction   . Vertigo   . Wart of face    Right cheek    Past Surgical History:  Procedure Laterality Date  . ANAL FISSURE REPAIR  06/24/2013  . APPENDECTOMY  2011  . COLONOSCOPY W/ BIOPSIES AND POLYPECTOMY  2012  . COLONSCOPY  2015  . EXCISIONAL HEMORRHOIDECTOMY    . FISSURECTOMY    . HEMORRHOID SURGERY  06/24/2013  . internal sphincterotomy  06-24-2013  . LUMBAR LAMINECTOMY/DECOMPRESSION MICRODISCECTOMY N/A 01/20/2015   Procedure: MICRO LUMBAR DECOMPRESSION L4-5/L3-4/L2-3;  Surgeon: Johnn Hai, MD;  Location: [facility];  Service: Orthopedics;  Laterality: N/A;  . SHOULDER ARTHROSCOPY  08/22/2012   Procedure: ARTHROSCOPY SHOULDER;  Surgeon: Marin Shutter, MD;  Location: [place];  Service: Orthopedics;  Laterality: Left;  Left shoulder arthroscopic lavage and debridement  . SHOULDER ARTHROSCOPY  09/02/2012   Procedure: ARTHROSCOPY SHOULDER;  Surgeon: Marin Shutter, MD;  Location: [place];  Service: Orthopedics;  Laterality: Left;  Left shoulder arthroscopy irrigation and debridement  . SHOULDER ARTHROSCOPY W/ ROTATOR CUFF REPAIR  07/09/2012   left  . SHOULDER OPEN ROTATOR CUFF REPAIR Right 12/13/2017   Procedure: Right shoulder mini open rotator cuff repair;  Surgeon: Susa Day, MD;  Location: [facility];  Service: Orthopedics;  Laterality: Right;  Interscalene Block  . SKIN LESION EXCISION     Benign  . WISDOM TOOTH EXTRACTION  YRS AGO    There were no vitals filed for this visit.  Subjective Assessment - 03/14/18 1617    Subjective  Pt reports he was riding his zero-turn mower yesterday when he hit a raised sewer pipe zolting the lawnmower such that he felt it in his arm. Increased pain/soreness yesterday to the point where he did not do his exercises but improving today.    Pertinent History  12/13/17 - R shoulder: Mini open rotator cuff repair utilizing JuggerKnot suture anchors of supraspinatus and infraspinatus; Subacromial decompression; Acromioplasty    Patient Stated Goals  "to get full use of my  arm again"    Currently in Pain?  No/denies         [facility] PT Assessment - 03/14/18 1616      Assessment   Medical Diagnosis  R shoulder - Mini open rotator cuff repair utilizing JuggerKnot suture anchors of supraspinatus and infraspinatus; Subacromial decompression; Acromioplasty    Referring Provider  Lacie Draft, PA-C for Donnald Garre, MD    Next MD Visit  05/06/18      Observation/Other Assessments   Focus on Therapeutic Outcomes (FOTO)   Shoulder - 60% (40% limitation) in 23 visits      AROM   Overall AROM Comments  seated    Right Shoulder Flexion  148 Degrees    Right Shoulder ABduction  142 Degrees    Right Shoulder  Internal Rotation  78 Degrees    Right Shoulder External Rotation  76 Degrees                  [facility] Adult PT Treatment/Exercise - 03/14/18 1616      Exercises   Exercises  Shoulder      Shoulder Exercises: Standing   Flexion  Right;10 reps;Weights;Strengthening    Shoulder Flexion Weight (lbs)  1    Flexion Limitations  standing against 1/2 FR on wall to promote scap retraction    ABduction  Right;Weights;Strengthening 8    Shoulder ABduction Weight (lbs)  1    ABduction Limitations  scaption, standing against 1/2 FR on wall to promote scap retraction - pt limited by fatigue    Extension  Both;15 reps;Theraband;Strengthening    Theraband Level (Shoulder Extension)  Level 2 (Red)    Row  Both;15 reps;Theraband;Strengthening    Theraband Level (Shoulder Row)  Level 2 (Red)    Other Standing Exercises  R shoulder flexion (2#) & scaption (1#) cabinet reaches x10 each      Shoulder Exercises: ROM/Strengthening   UBE (Upper Arm Bike)  L 2.0 fwd/back x 3' each      Modalities   Modalities  Vasopneumatic      Vasopneumatic   Number Minutes Vasopneumatic   15 minutes    Vasopnuematic Location   Shoulder    Vasopneumatic Pressure  Low    Vasopneumatic Temperature   lowest      Manual Therapy   Manual Therapy  Soft tissue mobilization;Myofascial release    Manual therapy comments  hooklying    Soft tissue mobilization  R pecs & ant/mid deltoids    Myofascial Release  manual TPR R pec major & ant deltoid               PT Short Term Goals - 03/05/18 1614      PT SHORT TERM GOAL #1   Title  independent with initial HEP    Status  Achieved      PT SHORT TERM GOAL #2   Title  R shoulder PROM equivalent to L shoulder AROM    Status  Achieved      PT SHORT TERM GOAL #3   Title  Pt will demonstrate neutral shoulder alignment to promote proper glenohumeral kinematics    Status  Achieved        PT Long Term Goals - 03/05/18 1614      PT LONG TERM GOAL #1    Title  Independent with ongoing HEP    Status  Partially Met      PT LONG TERM GOAL #2   Title  R shoulder AROM equivalent to  L shoulder w/o pain    Status  On-going      PT LONG TERM GOAL #3   Title  R shoulder strength >/= 4-/5 to 4/5 for improved function    Status  Partially Met      PT LONG TERM GOAL #4   Title  Pt will report no limitation in performance of ADLs or light household chores due to R shoulder pain, LOM or weakness    Status  Partially Met            Plan - 03/14/18 1622    Clinical Impression Statement  Ian [name] reporting increased anterior R shoulder pain following jarring trauma to shoulder when zero-turn mower hit an obstacle yesterday. Increased muscle tension & ttp noted over lateral pec major and anterior deltoid which improved with manual STM & MFR  R shoulder ROM and strength continues to improve as expected for current post-op progression.    Rehab Potential  Good    PT Treatment/Interventions  Patient/family education;ADLs/Self Care Home Management;Manual techniques;Passive range of motion;Scar mobilization;Therapeutic exercise;Therapeutic activities;Electrical Stimulation;Cryotherapy;Vasopneumatic Device;Moist Heat;Iontophoresis 76m/ml Dexamethasone;Dry needling;Taping    PT Next Visit Plan  progress AROM/strengthening as tolerated    Consulted and Agree with Plan of Care  Patient       Patient will benefit from skilled therapeutic intervention in order to improve the following deficits and impairments:  Decreased range of motion, Impaired flexibility, Pain, Impaired UE functional use, Postural dysfunction, Increased muscle spasms, Increased fascial restricitons, Decreased scar mobility, Decreased activity tolerance  Visit Diagnosis: Stiffness of right shoulder, not elsewhere classified  Acute pain of right shoulder  Muscle weakness (generalized)  Abnormal posture     Problem List Patient Active Problem List   Diagnosis Date Noted  . Complete  rotator cuff tear 12/13/2017  . Claudication ([place] 09/13/2017  . Allergy to pollen 11/06/2016  . Pulmonary embolism, bilateral ([place]   . Lower leg DVT (deep venous thromboembolism), acute ([place]   . Spinal stenosis of lumbar region at multiple levels 01/20/2015  . Screening, ischemic heart disease 01/03/2015  . Hypertriglyceridemia 08/17/2014  . Obesity 08/17/2014  . Type 2 diabetes mellitus without complication ([place] 014/43/1540 . Hx of adenomatous colonic polyps 06/03/2014  . Essential hypertension, benign 09/13/2013  . Thrombocytopenia ([place] 09/13/2013  . HTN (hypertension) 05/13/2013  . Hyperlipidemia   . Testicular hypofunction   . Biceps tendon rupture   . GERD (gastroesophageal reflux disease)   . Colon polyps     JPercival Spanish PT, MPT 03/14/2018, 6:38 PM  [facility] [place] [address]Phone: [phone]  Fax:  [phone] Name: Ian NIKOLAIMRN: [id number] of Birth: 919-Dec-1962

## 2018-03-19 ENCOUNTER — Ambulatory Visit: Payer: BLUE CROSS/BLUE SHIELD | Admitting: Physical Therapy

## 2018-03-20 ENCOUNTER — Ambulatory Visit: Payer: BLUE CROSS/BLUE SHIELD

## 2018-03-20 DIAGNOSIS — M6281 Muscle weakness (generalized): Secondary | ICD-10-CM

## 2018-03-20 DIAGNOSIS — R293 Abnormal posture: Secondary | ICD-10-CM

## 2018-03-20 DIAGNOSIS — M25611 Stiffness of right shoulder, not elsewhere classified: Secondary | ICD-10-CM

## 2018-03-20 DIAGNOSIS — M25511 Pain in right shoulder: Secondary | ICD-10-CM

## 2018-03-20 NOTE — Therapy (Signed)
[facility] [address]  [place], [place], [address] Phone: [phone]   Fax:  [phone]  Physical Therapy Treatment  Patient Details  Name: Ian [name] MRN: [id number] Date of Birth: 1961/12/16 Referring Provider: Lacie Draft, PA-C for Donnald Garre, MD   Encounter Date: 03/20/2018  PT End of Session - 03/20/18 1354    Visit Number  21    Number of Visits  24    Date for PT Re-Evaluation  03/29/18    Authorization Type  BCBS - VL: 34    PT Start Time  5809    PT Stop Time  1441 Ended with 10 min ice pack     PT Time Calculation (min)  53 min    Activity Tolerance  Patient tolerated treatment well    Behavior During Therapy  [facility] for tasks assessed/performed       Past Medical History:  Diagnosis Date  . Allergy   . Arthritis   . Biceps tendon rupture    bilateral  . Clostridium difficile infection 2014  . Colon polyps   . Diabetes ([place])    Borderline/Pre-diabetes  . GERD (gastroesophageal reflux disease)   . History of DVT (deep vein thrombosis)   . History of pulmonary embolism   . Hyperlipidemia   . Hypertension   . Internal bleeding hemorrhoids 2012   "might bleed once/month; only when I strain"  . Internal prolapsed hemorrhoids   . Leg cramps   . Testicular hypofunction   . Vertigo   . Wart of face    Right cheek    Past Surgical History:  Procedure Laterality Date  . ANAL FISSURE REPAIR  06/24/2013  . APPENDECTOMY  2011  . COLONOSCOPY W/ BIOPSIES AND POLYPECTOMY  2012  . COLONSCOPY  2015  . EXCISIONAL HEMORRHOIDECTOMY    . FISSURECTOMY    . HEMORRHOID SURGERY  06/24/2013  . internal sphincterotomy  06-24-2013  . LUMBAR LAMINECTOMY/DECOMPRESSION MICRODISCECTOMY N/A 01/20/2015   Procedure: MICRO LUMBAR DECOMPRESSION L4-5/L3-4/L2-3;  Surgeon: Johnn Hai, MD;  [facility];  Service: Orthopedics;  Laterality: N/A;  . SHOULDER ARTHROSCOPY  08/22/2012   Procedure: ARTHROSCOPY  SHOULDER;  Surgeon: Marin Shutter, MD;  Location: [place];  Service: Orthopedics;  Laterality: Left;  Left shoulder arthroscopic lavage and debridement  . SHOULDER ARTHROSCOPY  09/02/2012   Procedure: ARTHROSCOPY SHOULDER;  Surgeon: Marin Shutter, MD;  Location: [place];  Service: Orthopedics;  Laterality: Left;  Left shoulder arthroscopy irrigation and debridement  . SHOULDER ARTHROSCOPY W/ ROTATOR CUFF REPAIR  07/09/2012   left  . SHOULDER OPEN ROTATOR CUFF REPAIR Right 12/13/2017   Procedure: Right shoulder mini open rotator cuff repair;  Surgeon: Susa Day, MD;  [facility];  Service: Orthopedics;  Laterality: Right;  Interscalene Block  . SKIN LESION EXCISION     Benign  . WISDOM TOOTH EXTRACTION  YRS AGO    There were no vitals filed for this visit.  Subjective Assessment - 03/20/18 1352    Subjective  Pt. reporting he was able to use towel to dry off back after shower yesterday without issue.      Pertinent History  12/13/17 - R shoulder: Mini open rotator cuff repair utilizing JuggerKnot suture anchors of supraspinatus and infraspinatus; Subacromial decompression; Acromioplasty    Patient Stated Goals  "to get full use of my arm again"    Currently in Pain?  No/denies    Pain Score  0-No  pain 2/10 at worst     Multiple Pain Sites  No                No data recorded       [facility]/Exercise - 03/20/18 1358      Shoulder Exercises: Standing   External Rotation  Right;15 reps;Theraband;Strengthening    Theraband Level (Shoulder External Rotation)  Level 1 (Yellow)    External Rotation Limitations  neutral shoulder    Internal Rotation  Right;15 reps;Theraband;Strengthening    Theraband Level (Shoulder Internal Rotation)  Level 1 (Yellow)    Internal Rotation Limitations  neutral shoulder    Flexion  12 reps;Right;Strengthening;Weights    Shoulder Flexion Weight (lbs)  1    ABduction  Right;10 reps;Strengthening;Weights    Shoulder ABduction  Weight (lbs)  1    ABduction Limitations  scaption, standing against 1/2 FR on wall to promote scap retraction - pt limited by fatigue    Other Standing Exercises  R shoulder flexion (2#) & scaption (2#) cabinet reaches x 12 each    Other Standing Exercises  R D1/D2 flexion/ext. x 8 reps  Fatigue reported after D1 patterns       Shoulder Exercises: ROM/Strengthening   UBE (Upper Arm Bike)  L 2.0 fwd/back x 3' each    Cybex Row  10 reps    Cybex Row Limitations  10#    Wall Pushups  15 reps    Wall Pushups Limitations  orange p-ball      Cryotherapy   Number Minutes Cryotherapy  10 Minutes    Cryotherapy Location  Shoulder    Type of Cryotherapy  Ice pack      Manual Therapy   Manual Therapy  Soft tissue mobilization;Myofascial release    Manual therapy comments  hooklying    Soft tissue mobilization  R pecs & ant/mid deltoids    Myofascial Release  manual TPR R ant deltoid over insicion in area of reported tenderness                PT Short Term Goals - 03/05/18 1614      PT SHORT TERM GOAL #1   Title  independent with initial HEP    Status  Achieved      PT SHORT TERM GOAL #2   Title  R shoulder PROM equivalent to L shoulder AROM    Status  Achieved      PT SHORT TERM GOAL #3   Title  Pt will demonstrate neutral shoulder alignment to promote proper glenohumeral kinematics    Status  Achieved        PT Long Term Goals - 03/05/18 1614      PT LONG TERM GOAL #1   Title  Independent with ongoing HEP    Status  Partially Met      PT LONG TERM GOAL #2   Title  R shoulder AROM equivalent to L shoulder w/o pain    Status  On-going      PT LONG TERM GOAL #3   Title  R shoulder strength >/= 4-/5 to 4/5 for improved function    Status  Partially Met      PT LONG TERM GOAL #4   Title  Pt will report no limitation in performance of ADLs or light household chores due to R shoulder pain, LOM or weakness    Status  Partially Met            Plan -  03/20/18  1355    Clinical Impression Statement  Pt. doing well today reporting he feels he has "completely recovered" from shoulder "flare-up" after mower accident last week.  Pt. tolerated gentle progression of scapular/RTC strengthening activities well today with addition of diagonal functional patterns.  Progressing well toward goals.      PT Treatment/Interventions  Patient/family education;ADLs/Self Care Home Management;Manual techniques;Passive range of motion;Scar mobilization;Therapeutic exercise;Therapeutic activities;Electrical Stimulation;Cryotherapy;Vasopneumatic Device;Moist Heat;Iontophoresis 71m/ml Dexamethasone;Dry needling;Taping    PT Next Visit Plan  progress AROM/strengthening as tolerated       Patient will benefit from skilled therapeutic intervention in order to improve the following deficits and impairments:  Decreased range of motion, Impaired flexibility, Pain, Impaired UE functional use, Postural dysfunction, Increased muscle spasms, Increased fascial restricitons, Decreased scar mobility, Decreased activity tolerance  Visit Diagnosis: Stiffness of right shoulder, not elsewhere classified  Acute pain of right shoulder  Muscle weakness (generalized)  Abnormal posture     Problem List Patient Active Problem List   Diagnosis Date Noted  . Complete rotator cuff tear 12/13/2017  . Claudication ([place] 09/13/2017  . Allergy to pollen 11/06/2016  . Pulmonary embolism, bilateral ([place]   . Lower leg DVT (deep venous thromboembolism), acute ([place]   . Spinal stenosis of lumbar region at multiple levels 01/20/2015  . Screening, ischemic heart disease 01/03/2015  . Hypertriglyceridemia 08/17/2014  . Obesity 08/17/2014  . Type 2 diabetes mellitus without complication ([place] 056/86/1683 . Hx of adenomatous colonic polyps 06/03/2014  . Essential hypertension, benign 09/13/2013  . Thrombocytopenia ([place] 09/13/2013  . HTN (hypertension) 05/13/2013  . Hyperlipidemia   . Testicular  hypofunction   . Biceps tendon rupture   . GERD (gastroesophageal reflux disease)   . Colon polyps     MBess Harvest PTA 03/20/18 4:09 PM  [facility] [address] [place] [address]Phone: [phone]  Fax:  [phone] Name: DJERRIE GULLOMRN: [id number] of Birth: [birth year]

## 2018-03-21 ENCOUNTER — Encounter: Payer: Self-pay | Admitting: Physical Therapy

## 2018-03-21 ENCOUNTER — Ambulatory Visit: Payer: BLUE CROSS/BLUE SHIELD | Admitting: Physical Therapy

## 2018-03-21 DIAGNOSIS — R293 Abnormal posture: Secondary | ICD-10-CM

## 2018-03-21 DIAGNOSIS — M25511 Pain in right shoulder: Secondary | ICD-10-CM

## 2018-03-21 DIAGNOSIS — M25611 Stiffness of right shoulder, not elsewhere classified: Secondary | ICD-10-CM

## 2018-03-21 DIAGNOSIS — M6281 Muscle weakness (generalized): Secondary | ICD-10-CM

## 2018-03-21 NOTE — Therapy (Signed)
[facility] [address]  [place], [place], [address] Phone: [phone]   Fax:  [phone]  Physical Therapy Treatment  Patient Details  Name: Ian [name] MRN: [id number] Date of Birth: Aug 12, 1961 Referring Provider: Lacie Draft, PA-C for Donnald Garre, MD   Encounter Date: 03/21/2018  PT End of Session - 03/21/18 1616    Visit Number  22    Number of Visits  24    Date for PT Re-Evaluation  03/29/18    Authorization Type  BCBS - VL: 30    PT Start Time  4742    PT Stop Time  1711    PT Time Calculation (min)  55 min    Activity Tolerance  Patient tolerated treatment well    Behavior During Therapy  [facility] for tasks assessed/performed       Past Medical History:  Diagnosis Date  . Allergy   . Arthritis   . Biceps tendon rupture    bilateral  . Clostridium difficile infection 2014  . Colon polyps   . Diabetes ([place])    Borderline/Pre-diabetes  . GERD (gastroesophageal reflux disease)   . History of DVT (deep vein thrombosis)   . History of pulmonary embolism   . Hyperlipidemia   . Hypertension   . Internal bleeding hemorrhoids 2012   "might bleed once/month; only when I strain"  . Internal prolapsed hemorrhoids   . Leg cramps   . Testicular hypofunction   . Vertigo   . Wart of face    Right cheek    Past Surgical History:  Procedure Laterality Date  . ANAL FISSURE REPAIR  06/24/2013  . APPENDECTOMY  2011  . COLONOSCOPY W/ BIOPSIES AND POLYPECTOMY  2012  . COLONSCOPY  2015  . EXCISIONAL HEMORRHOIDECTOMY    . FISSURECTOMY    . HEMORRHOID SURGERY  06/24/2013  . internal sphincterotomy  06-24-2013  . LUMBAR LAMINECTOMY/DECOMPRESSION MICRODISCECTOMY N/A 01/20/2015   Procedure: MICRO LUMBAR DECOMPRESSION L4-5/L3-4/L2-3;  Surgeon: Johnn Hai, MD;  Location: [facility];  Service: Orthopedics;  Laterality: N/A;  . SHOULDER ARTHROSCOPY  08/22/2012   Procedure: ARTHROSCOPY SHOULDER;  Surgeon: Marin Shutter, MD;  Location: [facility];  Service: Orthopedics;  Laterality: Left;  Left shoulder arthroscopic lavage and debridement  . SHOULDER ARTHROSCOPY  09/02/2012   Procedure: ARTHROSCOPY SHOULDER;  Surgeon: Marin Shutter, MD;  Location: [facility];  Service: Orthopedics;  Laterality: Left;  Left shoulder arthroscopy irrigation and debridement  . SHOULDER ARTHROSCOPY W/ ROTATOR CUFF REPAIR  07/09/2012   left  . SHOULDER OPEN ROTATOR CUFF REPAIR Right 12/13/2017   Procedure: Right shoulder mini open rotator cuff repair;  Surgeon: Susa Day, MD;  Location: [facility];  Service: Orthopedics;  Laterality: Right;  Interscalene Block  . SKIN LESION EXCISION     Benign  . WISDOM TOOTH EXTRACTION  YRS AGO    There were no vitals filed for this visit.  Subjective Assessment - 03/21/18 1619    Subjective  Pt doing well today.    Pertinent History  12/13/17 - R shoulder: Mini open rotator cuff repair utilizing JuggerKnot suture anchors of supraspinatus and infraspinatus; Subacromial decompression; Acromioplasty    Patient Stated Goals  "to get full use of my arm again"    Currently in Pain?  No/denies                No data recorded       [place] Adult PT Treatment/Exercise -  03/21/18 1616      Exercises   Exercises  Shoulder      Shoulder Exercises: Sidelying   External Rotation  Right;15 reps;Weights;Strengthening    External Rotation Weight (lbs)  2    External Rotation Limitations  verbal & tactile cues for initiation of movement with scapular retraction    ABduction  Right;15 reps;Weights;Strengthening    ABduction Weight (lbs)  2      Shoulder Exercises: Standing   Extension  Both;15 reps;Theraband;Strengthening    Theraband Level (Shoulder Extension)  Level 3 (Green)    Row  Both;15 reps;Theraband;Strengthening    Theraband Level (Shoulder Row)  Level 3 (Green)      Shoulder Exercises: ROM/Strengthening   UBE (Upper Arm Bike)  L 2.0 fwd/back x 3' each    "W" Arms  yellow TB  row 10 x 3"    X to V Arms  yellow TB 10 x     Rhythmic Stabilization, Supine  R shoulder at 90 dg flexion 3# 3x30"      Modalities   Modalities  Cryotherapy      Cryotherapy   Cryotherapy Location  Shoulder      Manual Therapy   Manual Therapy  Soft tissue mobilization;Myofascial release    Manual therapy comments  hooklying    Soft tissue mobilization  R ant deltoid & infraspintus    Myofascial Release  manual TPR R ant deltoid at distal incision               PT Short Term Goals - 03/05/18 1614      PT SHORT TERM GOAL #1   Title  independent with initial HEP    Status  Achieved      PT SHORT TERM GOAL #2   Title  R shoulder PROM equivalent to L shoulder AROM    Status  Achieved      PT SHORT TERM GOAL #3   Title  Pt will demonstrate neutral shoulder alignment to promote proper glenohumeral kinematics    Status  Achieved        PT Long Term Goals - 03/05/18 1614      PT LONG TERM GOAL #1   Title  Independent with ongoing HEP    Status  Partially Met      PT LONG TERM GOAL #2   Title  R shoulder AROM equivalent to L shoulder w/o pain    Status  On-going      PT LONG TERM GOAL #3   Title  R shoulder strength >/= 4-/5 to 4/5 for improved function    Status  Partially Met      PT LONG TERM GOAL #4   Title  Pt will report no limitation in performance of ADLs or light household chores due to R shoulder pain, LOM or weakness    Status  Partially Met            Plan - 03/21/18 1621    Clinical Impression Statement  Ian [name] reporting he has been progressing himself with his HEP, switching to red TB with shoulder IR/ER and doubling up red & yellow TB with rows & extension - progressed rows/extensions to green TB with good tolerance, therefore provided green TB for home use. Continued tenderness noted in anterior deltoid, but better than yesterday and further improved after manual therapy today. One week remaining in POC, with pt on track to meet goals and  transition to HEP therefore will focus on review/update of HEP   on next visit in prep for discharge.    Rehab Potential  Good    PT Treatment/Interventions  Patient/family education;ADLs/Self Care Home Management;Manual techniques;Passive range of motion;Scar mobilization;Therapeutic exercise;Therapeutic activities;Electrical Stimulation;Cryotherapy;Vasopneumatic Device;Moist Heat;Iontophoresis 56m/ml Dexamethasone;Dry needling;Taping    PT Next Visit Plan  progress AROM/strengthening as tolerated       Patient will benefit from skilled therapeutic intervention in order to improve the following deficits and impairments:  Decreased range of motion, Impaired flexibility, Pain, Impaired UE functional use, Postural dysfunction, Increased muscle spasms, Increased fascial restricitons, Decreased scar mobility, Decreased activity tolerance  Visit Diagnosis: Stiffness of right shoulder, not elsewhere classified  Acute pain of right shoulder  Muscle weakness (generalized)  Abnormal posture     Problem List Patient Active Problem List   Diagnosis Date Noted  . Complete rotator cuff tear 12/13/2017  . Claudication ([place] 09/13/2017  . Allergy to pollen 11/06/2016  . Pulmonary embolism, bilateral ([place]   . Lower leg DVT (deep venous thromboembolism), acute ([place]   . Spinal stenosis of lumbar region at multiple levels 01/20/2015  . Screening, ischemic heart disease 01/03/2015  . Hypertriglyceridemia 08/17/2014  . Obesity 08/17/2014  . Type 2 diabetes mellitus without complication ([place] 036/64/4034 . Hx of adenomatous colonic polyps 06/03/2014  . Essential hypertension, benign 09/13/2013  . Thrombocytopenia ([place] 09/13/2013  . HTN (hypertension) 05/13/2013  . Hyperlipidemia   . Testicular hypofunction   . Biceps tendon rupture   . GERD (gastroesophageal reflux disease)   . Colon polyps     JPercival Spanish PT, MPT 03/21/2018, 5:36 PM  [facility] [place] [address]Phone: [phone]  Fax:  [phone] Name: DDEVEREAUX GRAYSONMRN: [id number] of Birth: 906-08-1961

## 2018-03-26 ENCOUNTER — Ambulatory Visit: Payer: BLUE CROSS/BLUE SHIELD | Attending: Orthopedic Surgery | Admitting: Physical Therapy

## 2018-03-26 ENCOUNTER — Encounter: Payer: Self-pay | Admitting: Physical Therapy

## 2018-03-26 DIAGNOSIS — R293 Abnormal posture: Secondary | ICD-10-CM

## 2018-03-26 DIAGNOSIS — M25511 Pain in right shoulder: Secondary | ICD-10-CM | POA: Diagnosis present

## 2018-03-26 DIAGNOSIS — M6281 Muscle weakness (generalized): Secondary | ICD-10-CM | POA: Insufficient documentation

## 2018-03-26 DIAGNOSIS — M25611 Stiffness of right shoulder, not elsewhere classified: Secondary | ICD-10-CM

## 2018-03-26 NOTE — Therapy (Signed)
[facility] [address]  [place], [place], [address] Phone: [phone]   Fax:  [phone]  Physical Therapy Treatment  Patient Details  Name: Ian [name] MRN: [id number] Date of Birth: 08/01/1961 Referring Provider: Lacie Draft, PA-C for Ian Garre, MD   Encounter Date: 03/26/2018  PT End of Session - 03/26/18 1605    Visit Number  23    Number of Visits  24    Date for PT Re-Evaluation  03/29/18    Authorization Type  BCBS - VL: 71    PT Start Time  0962    PT Stop Time  1700    PT Time Calculation (min)  55 min    Activity Tolerance  Patient tolerated treatment well    Behavior During Therapy  [facility] for tasks assessed/performed       Past Medical History:  Diagnosis Date  . Allergy   . Arthritis   . Biceps tendon rupture    bilateral  . Clostridium difficile infection 2014  . Colon polyps   . Diabetes ([place])    Borderline/Pre-diabetes  . GERD (gastroesophageal reflux disease)   . History of DVT (deep vein thrombosis)   . History of pulmonary embolism   . Hyperlipidemia   . Hypertension   . Internal bleeding hemorrhoids 2012   "might bleed once/month; only when I strain"  . Internal prolapsed hemorrhoids   . Leg cramps   . Testicular hypofunction   . Vertigo   . Wart of face    Right cheek    Past Surgical History:  Procedure Laterality Date  . ANAL FISSURE REPAIR  06/24/2013  . APPENDECTOMY  2011  . COLONOSCOPY W/ BIOPSIES AND POLYPECTOMY  2012  . COLONSCOPY  2015  . EXCISIONAL HEMORRHOIDECTOMY    . FISSURECTOMY    . HEMORRHOID SURGERY  06/24/2013  . internal sphincterotomy  06-24-2013  . LUMBAR LAMINECTOMY/DECOMPRESSION MICRODISCECTOMY N/A 01/20/2015   Procedure: MICRO LUMBAR DECOMPRESSION L4-5/L3-4/L2-3;  Surgeon: Johnn Hai, MD;  [facility];  Service: Orthopedics;  Laterality: N/A;  . SHOULDER ARTHROSCOPY  08/22/2012   Procedure: ARTHROSCOPY SHOULDER;  Surgeon: Marin Shutter, MD;  Location: [facility];  Service: Orthopedics;  Laterality: Left;  Left shoulder arthroscopic lavage and debridement  . SHOULDER ARTHROSCOPY  09/02/2012   Procedure: ARTHROSCOPY SHOULDER;  Surgeon: Marin Shutter, MD;  Location: [facility];  Service: Orthopedics;  Laterality: Left;  Left shoulder arthroscopy irrigation and debridement  . SHOULDER ARTHROSCOPY W/ ROTATOR CUFF REPAIR  07/09/2012   left  . SHOULDER OPEN ROTATOR CUFF REPAIR Right 12/13/2017   Procedure: Right shoulder mini open rotator cuff repair;  Surgeon: Susa Day, MD;  [facility];  Service: Orthopedics;  Laterality: Right;  Interscalene Block  . SKIN LESION EXCISION     Benign  . WISDOM TOOTH EXTRACTION  YRS AGO    There were no vitals filed for this visit.  Subjective Assessment - 03/26/18 1607    Subjective  Pt reporting no issues or concerns with functional use of R arm/shoulder.    Pertinent History  12/13/17 - R shoulder: Mini open rotator cuff repair utilizing JuggerKnot suture anchors of supraspinatus and infraspinatus; Subacromial decompression; Acromioplasty    Patient Stated Goals  "to get full use of my arm again"    Currently in Pain?  No/denies  [facility] Adult PT Treatment/Exercise - 03/26/18 1605      Exercises   Exercises  Shoulder      Shoulder Exercises: Prone   Extension  Both;15 reps;Weights;Strengthening    Extension Limitations  I's - standing leaning forward over green Pball pt unable to tolerate lying prone    External Rotation  Both;10 reps;Strengthening    External Rotation Limitations  W's - standing leaning forward over green Pball    Horizontal ABduction 1  Both;15 reps;Strengthening    Horizontal ABduction 1 Limitations  T's - standing leaning forward over green Pball    Horizontal ABduction 2  Both;10 reps;Strengthening    Horizontal ABduction 2 Limitations  Y's - standing leaning forward over green Pball      Shoulder Exercises: Standing    Horizontal ABduction  Both;10 reps;Theraband;Strengthening    Theraband Level (Shoulder Horizontal ABduction)  Level 2 (Red)    External Rotation  Both;10 reps;Theraband;Strengthening    Theraband Level (Shoulder External Rotation)  Level 1 (Yellow)    External Rotation Limitations  at 90 dg abduction - limited motion B    Internal Rotation  Both;15 reps;Theraband;Strengthening    Theraband Level (Shoulder Internal Rotation)  Level 1 (Yellow)    Internal Rotation Limitations  at 90 dg abduction    ABduction  Both;10 reps;Theraband;Strengthening    Theraband Level (Shoulder ABduction)  Level 1 (Yellow)    Other Standing Exercises  Shoulder flexion + opp shoulder extension diagonals with red TB 10 x 3"      Shoulder Exercises: ROM/Strengthening   UBE (Upper Arm Bike)  L 2.5 fwd/back x 3' each    Wall Pushups  15 reps    X to V Arms  yellow TB 10 x 3"      Modalities   Modalities  Cryotherapy      Cryotherapy   Number Minutes Cryotherapy  10 Minutes    Cryotherapy Location  Shoulder    Type of Cryotherapy  Ice pack               PT Short Term Goals - 03/05/18 1614      PT SHORT TERM GOAL #1   Title  independent with initial HEP    Status  Achieved      PT SHORT TERM GOAL #2   Title  R shoulder PROM equivalent to L shoulder AROM    Status  Achieved      PT SHORT TERM GOAL #3   Title  Pt will demonstrate neutral shoulder alignment to promote proper glenohumeral kinematics    Status  Achieved        PT Long Term Goals - 03/05/18 1614      PT LONG TERM GOAL #1   Title  Independent with ongoing HEP    Status  Partially Met      PT LONG TERM GOAL #2   Title  R shoulder AROM equivalent to L shoulder w/o pain    Status  On-going      PT LONG TERM GOAL #3   Title  R shoulder strength >/= 4-/5 to 4/5 for improved function    Status  Partially Met      PT LONG TERM GOAL #4   Title  Pt will report no limitation in performance of ADLs or light household chores due  to R shoulder pain, LOM or weakness    Status  Partially Met            Plan - 03/26/18  Ian  [name] denies any pain or limitations with functional use of R arm/shoulder today. HEP reviewed and updated in anticipation of discharge and transition to HEP on next visit, with pt able to perform all exercises appropriately (pt to complete prone I's, T's, Y's and W's on incline bench at home due to limited tolerance for prone). Will plan for final review to address any questions or concerns on next visit then anticipate transition to HEP.    Rehab Potential  Good    PT Treatment/Interventions  Patient/family education;ADLs/Self Care Home Management;Manual techniques;Passive range of motion;Scar mobilization;Therapeutic exercise;Therapeutic activities;Electrical Stimulation;Cryotherapy;Vasopneumatic Device;Moist Heat;Iontophoresis 49m/ml Dexamethasone;Dry needling;Taping    PT Next Visit Plan  progress AROM/strengthening as tolerated       Patient will benefit from skilled therapeutic intervention in order to improve the following deficits and impairments:  Decreased range of motion, Impaired flexibility, Pain, Impaired UE functional use, Postural dysfunction, Increased muscle spasms, Increased fascial restricitons, Decreased scar mobility, Decreased activity tolerance  Visit Diagnosis: Stiffness of right shoulder, not elsewhere classified  Acute pain of right shoulder  Muscle weakness (generalized)  Abnormal posture     Problem List Patient Active Problem List   Diagnosis Date Noted  . Complete rotator cuff tear 12/13/2017  . Claudication ([place] 09/13/2017  . Allergy to pollen 11/06/2016  . Pulmonary embolism, bilateral ([place]   . Lower leg DVT (deep venous thromboembolism), acute ([place]   . Spinal stenosis of lumbar region at multiple levels 01/20/2015  . Screening, ischemic heart disease 01/03/2015  . Hypertriglyceridemia 08/17/2014  . Obesity  08/17/2014  . Type 2 diabetes mellitus without complication ([place] 008/13/8871 . Hx of adenomatous colonic polyps 06/03/2014  . Essential hypertension, benign 09/13/2013  . Thrombocytopenia ([place] 09/13/2013  . HTN (hypertension) 05/13/2013  . Hyperlipidemia   . Testicular hypofunction   . Biceps tendon rupture   . GERD (gastroesophageal reflux disease)   . Colon polyps     JPercival Spanish PT, MPT 03/26/2018, 5:22 PM  [facility] [place] [address]Phone: [phone]  Fax:  [phone] Name: Ian WOODFORDMRN: [id number] of Birth: [birth year]

## 2018-03-28 ENCOUNTER — Ambulatory Visit: Payer: BLUE CROSS/BLUE SHIELD | Admitting: Physical Therapy

## 2018-04-01 ENCOUNTER — Encounter: Payer: Self-pay | Admitting: Physical Therapy

## 2018-04-01 ENCOUNTER — Ambulatory Visit: Payer: BLUE CROSS/BLUE SHIELD | Admitting: Physical Therapy

## 2018-04-01 DIAGNOSIS — M25511 Pain in right shoulder: Secondary | ICD-10-CM

## 2018-04-01 DIAGNOSIS — R293 Abnormal posture: Secondary | ICD-10-CM

## 2018-04-01 DIAGNOSIS — M25611 Stiffness of right shoulder, not elsewhere classified: Secondary | ICD-10-CM | POA: Diagnosis not present

## 2018-04-01 DIAGNOSIS — M6281 Muscle weakness (generalized): Secondary | ICD-10-CM

## 2018-04-01 NOTE — Therapy (Addendum)
[facility] [address]  [place], [place], [address] Phone: [phone]   Fax:  [phone]  Physical Therapy Treatment  Patient Details  Name: Ian [name] MRN: [id number] Date of Birth: 12-13-1961 Referring Provider: Arnette Norris, PA-C for Donnald Garre, MD   Encounter Date: 04/01/2018  PT End of Session - 04/01/18 1433    Visit Number  24    Number of Visits  24    Date for PT Re-Evaluation  --    Authorization Type  BCBS - VL: 48    PT Start Time  8616    PT Stop Time  1521    PT Time Calculation (min)  48 min    Activity Tolerance  Patient tolerated treatment well    Behavior During Therapy  [facility] for tasks assessed/performed       Past Medical History:  Diagnosis Date  . Allergy   . Arthritis   . Biceps tendon rupture    bilateral  . Clostridium difficile infection 2014  . Colon polyps   . Diabetes ([place])    Borderline/Pre-diabetes  . GERD (gastroesophageal reflux disease)   . History of DVT (deep vein thrombosis)   . History of pulmonary embolism   . Hyperlipidemia   . Hypertension   . Internal bleeding hemorrhoids 2012   "might bleed once/month; only when I strain"  . Internal prolapsed hemorrhoids   . Leg cramps   . Testicular hypofunction   . Vertigo   . Wart of face    Right cheek    Past Surgical History:  Procedure Laterality Date  . ANAL FISSURE REPAIR  06/24/2013  . APPENDECTOMY  2011  . COLONOSCOPY W/ BIOPSIES AND POLYPECTOMY  2012  . COLONSCOPY  2015  . EXCISIONAL HEMORRHOIDECTOMY    . FISSURECTOMY    . HEMORRHOID SURGERY  06/24/2013  . internal sphincterotomy  06-24-2013  . LUMBAR LAMINECTOMY/DECOMPRESSION MICRODISCECTOMY N/A 01/20/2015   Procedure: MICRO LUMBAR DECOMPRESSION L4-5/L3-4/L2-3;  Surgeon: Johnn Hai, MD;  Location: [facility];  Service: Orthopedics;  Laterality: N/A;  . SHOULDER ARTHROSCOPY  08/22/2012   Procedure: ARTHROSCOPY SHOULDER;  Surgeon: Marin Shutter, MD;  Location: [facility];  Service: Orthopedics;  Laterality: Left;  Left shoulder arthroscopic lavage and debridement  . SHOULDER ARTHROSCOPY  09/02/2012   Procedure: ARTHROSCOPY SHOULDER;  Surgeon: Marin Shutter, MD;  Location: [facility];  Service: Orthopedics;  Laterality: Left;  Left shoulder arthroscopy irrigation and debridement  . SHOULDER ARTHROSCOPY W/ ROTATOR CUFF REPAIR  07/09/2012   left  . SHOULDER OPEN ROTATOR CUFF REPAIR Right 12/13/2017   Procedure: Right shoulder mini open rotator cuff repair;  Surgeon: Susa Day, MD;  Location: [facility];  Service: Orthopedics;  Laterality: Right;  Interscalene Block  . SKIN LESION EXCISION     Benign  . WISDOM TOOTH EXTRACTION  YRS AGO    There were no vitals filed for this visit.  Subjective Assessment - 04/01/18 1433    Subjective  Pt. stated that on saturday he had an incident putting on a glove in which the pain in the R shoulder was intense, however noting no pain upon reporting to PT today.     Pertinent History  12/13/17 - R shoulder: Mini open rotator cuff repair utilizing JuggerKnot suture anchors of supraspinatus and infraspinatus; Subacromial decompression; Acromioplasty    Patient Stated Goals  "to get full use of my arm again"    Currently in Pain?  No/denies  Pain Score  0-No pain         [facility] PT Assessment - 04/01/18 1434      Assessment   Medical Diagnosis  R shoulder - Mini open rotator cuff repair utilizing JuggerKnot suture anchors of supraspinatus and infraspinatus; Subacromial decompression; Acromioplasty    Referring Provider  Arnette Norris, PA-C for Donnald Garre, MD    Next MD Visit  05/06/18      Observation/Other Assessments   Focus on Therapeutic Outcomes (FOTO)   Shoulder - 64% (36% Limitation)       AROM   Overall AROM Comments  seated; supine    Right Shoulder Flexion  151 Degrees    Right Shoulder ABduction  143 Degrees    Right Shoulder Internal Rotation  84 Degrees    Right Shoulder  External Rotation  75 Degrees      Strength   Strength Assessment Site  Shoulder    Right/Left Shoulder  Right;Left    Right Shoulder Flexion  4+/5    Right Shoulder ABduction  4/5    Right Shoulder Internal Rotation  4+/5    Right Shoulder External Rotation  4/5    Left Shoulder Flexion  4+/5    Left Shoulder ABduction  4+/5    Left Shoulder Internal Rotation  4+/5    Left Shoulder External Rotation  4+/5                   [facility] Adult PT Treatment/Exercise - 04/01/18 1434      Exercises   Exercises  Shoulder      Shoulder Exercises: Seated   External Rotation  Right;10 reps;Theraband    Theraband Level (Shoulder External Rotation)  Level 3 (Green)    Internal Rotation  Right;10 reps;Theraband    Theraband Level (Shoulder Internal Rotation)  Level 3 (Green)      Shoulder Exercises: Standing   External Rotation  Both;10 reps;Theraband    Theraband Level (Shoulder External Rotation)  Level 1 (Yellow)    External Rotation Limitations  at 90 dg abduction    Internal Rotation  Both;10 reps;Theraband    Theraband Level (Shoulder Internal Rotation)  Level 1 (Yellow)    Internal Rotation Limitations  at 90 dg abduction      Shoulder Exercises: ROM/Strengthening   UBE (Upper Arm Bike)  L 2.5 fwd/back x 3' each      Manual Therapy   Manual Therapy  Soft tissue mobilization    Manual therapy comments  supine    Soft tissue mobilization  R pecs             PT Education - 04/01/18 1528    Education provided  Yes    Education Details  Reviewed HEP and addressed concerns.     Person(s) Educated  Patient    Methods  Explanation;Demonstration    Comprehension  Verbalized understanding;Returned demonstration       PT Short Term Goals - 03/05/18 1614      PT SHORT TERM GOAL #1   Title  independent with initial HEP    Status  Achieved      PT SHORT TERM GOAL #2   Title  R shoulder PROM equivalent to L shoulder AROM    Status  Achieved      PT SHORT TERM  GOAL #3   Title  Pt will demonstrate neutral shoulder alignment to promote proper glenohumeral kinematics    Status  Achieved        PT  Long Term Goals - 04/01/18 1459      PT LONG TERM GOAL #1   Title  Independent with ongoing HEP    Status  Achieved      PT LONG TERM GOAL #2   Title  R shoulder AROM equivalent to L shoulder w/o pain    Status  Achieved      PT LONG TERM GOAL #3   Title  R shoulder strength >/= 4-/5 to 4/5 for improved function    Status  Achieved      PT LONG TERM GOAL #4   Title  Pt will report no limitation in performance of ADLs or light household chores due to R shoulder pain, LOM or weakness    Status  Achieved            Plan - 04/01/18 1530    Clinical Impression Statement  Wren denies any pain/limitations with the R shoulder. Pt. stated that he did have an incident this weekend in which he experienced a sharp pain in R shoulder upon trying to place a glove on L hand, however no pain noted in today's session. AROM/Strength testing of R shoulder was completed and all were [facility]. Completed final review of HEP. Patient demonstrates functional use of R shoulder and has made excellent improvements with PT. Given incident that occured over the weekend with increased pain, pt. placed on 30 day hold in the event further incidents arise.      Rehab Potential  Good    PT Treatment/Interventions  Patient/family education;ADLs/Self Care Home Management;Manual techniques;Passive range of motion;Scar mobilization;Therapeutic exercise;Therapeutic activities;Electrical Stimulation;Cryotherapy;Vasopneumatic Device;Moist Heat;Iontophoresis 46m/ml Dexamethasone;Dry needling;Taping    PT Next Visit Plan  progress AROM/strengthening as tolerated       Patient will benefit from skilled therapeutic intervention in order to improve the following deficits and impairments:  Decreased range of motion, Impaired flexibility, Pain, Impaired UE functional use, Postural dysfunction,  Increased muscle spasms, Increased fascial restricitons, Decreased scar mobility, Decreased activity tolerance  Visit Diagnosis: Stiffness of right shoulder, not elsewhere classified  Acute pain of right shoulder  Muscle weakness (generalized)  Abnormal posture     Problem List Patient Active Problem List   Diagnosis Date Noted  . Complete rotator cuff tear 12/13/2017  . Claudication ([place] 09/13/2017  . Allergy to pollen 11/06/2016  . Pulmonary embolism, bilateral ([place]   . Lower leg DVT (deep venous thromboembolism), acute ([place]   . Spinal stenosis of lumbar region at multiple levels 01/20/2015  . Screening, ischemic heart disease 01/03/2015  . Hypertriglyceridemia 08/17/2014  . Obesity 08/17/2014  . Type 2 diabetes mellitus without complication ([place] 054/62/7035 . Hx of adenomatous colonic polyps 06/03/2014  . Essential hypertension, benign 09/13/2013  . Thrombocytopenia ([place] 09/13/2013  . HTN (hypertension) 05/13/2013  . Hyperlipidemia   . Testicular hypofunction   . Biceps tendon rupture   . GERD (gastroesophageal reflux disease)   . Colon polyps     KBaldomero Lamy SPT 04/01/2018, 3:54 PM  [facility] [place] [address]Phone: [phone]  Fax:  [phone] Name: DFARAZ PONCIANOMRN: [id number] of Birth: [birth year]  This entire session was performed under direct supervision and direction of a licensed PT. I have personally read, edited and approved of the note as written.  JPercival Spanish PT, MPT 04/01/18, 4:18 PM  [facility] [address]  [place], [place], [address] Phone: [phone]   Fax:  [phone]    PHYSICAL THERAPY DISCHARGE SUMMARY  Visits from Start of Care: 24  Current functional level related to goals / functional outcomes:   Refer to above clinical impression for status  as of last visit on 04/01/18. Pt was placed on hold for 30 days and has not needed to return to PT, therefore will proceed with discharge from PT for this episode.   Remaining deficits:   As above   Education / Equipment:   HEP  Plan: Patient agrees to discharge.  Patient goals were not met. Patient is being discharged due to meeting the stated rehab goals.  ?????    Percival Spanish, PT, MPT 05/13/18, 10:54 AM  [facility] [address]  [place], [place], [address] Phone: [phone]   Fax:  [phone]

## 2018-04-11 ENCOUNTER — Other Ambulatory Visit: Payer: Self-pay | Admitting: Family Medicine

## 2018-04-29 ENCOUNTER — Inpatient Hospital Stay: Payer: BLUE CROSS/BLUE SHIELD

## 2018-04-29 ENCOUNTER — Inpatient Hospital Stay: Payer: BLUE CROSS/BLUE SHIELD | Attending: Hematology & Oncology | Admitting: Hematology & Oncology

## 2018-04-29 ENCOUNTER — Other Ambulatory Visit: Payer: Self-pay

## 2018-04-29 ENCOUNTER — Encounter: Payer: Self-pay | Admitting: Hematology & Oncology

## 2018-04-29 VITALS — BP 134/80 | HR 80 | Temp 98.3°F | Resp 18 | Wt 241.0 lb

## 2018-04-29 DIAGNOSIS — Z86711 Personal history of pulmonary embolism: Secondary | ICD-10-CM | POA: Diagnosis not present

## 2018-04-29 DIAGNOSIS — I82532 Chronic embolism and thrombosis of left popliteal vein: Secondary | ICD-10-CM

## 2018-04-29 DIAGNOSIS — I82512 Chronic embolism and thrombosis of left femoral vein: Secondary | ICD-10-CM

## 2018-04-29 DIAGNOSIS — Z7901 Long term (current) use of anticoagulants: Secondary | ICD-10-CM

## 2018-04-29 DIAGNOSIS — I2699 Other pulmonary embolism without acute cor pulmonale: Secondary | ICD-10-CM

## 2018-04-29 LAB — CMP (CANCER CENTER ONLY)
ALT: 23 U/L (ref 0–55)
ANION GAP: 14 — AB (ref 3–11)
AST: 25 U/L (ref 5–34)
Albumin: 3.7 g/dL (ref 3.5–5.0)
Alkaline Phosphatase: 45 U/L (ref 40–150)
BUN: 17 mg/dL (ref 7–26)
CHLORIDE: 104 mmol/L (ref 98–109)
CO2: 22 mmol/L (ref 22–29)
Calcium: 9.9 mg/dL (ref 8.4–10.4)
Creatinine: 1.09 mg/dL (ref 0.70–1.30)
GFR, Estimated: >60 mL/min (ref >60)
Glucose, Bld: 95 mg/dL (ref 70–140)
POTASSIUM: 4 mmol/L (ref 3.5–5.1)
SODIUM: 140 mmol/L (ref 136–145)
TOTAL PROTEIN: 7.2 g/dL (ref 6.4–8.3)
Total Bilirubin: 0.7 mg/dL (ref 0.2–1.2)

## 2018-04-29 LAB — CBC WITH DIFFERENTIAL (CANCER CENTER ONLY)
Basophils Absolute: 0 10*3/uL (ref 0.0–0.1)
Basophils Relative: 1 %
EOS ABS: 0.2 10*3/uL (ref 0.0–0.5)
EOS PCT: 4 %
HCT: 49 % (ref 38.7–49.9)
Hemoglobin: 17.3 g/dL — ABNORMAL HIGH (ref 13.0–17.1)
LYMPHS ABS: 1.7 10*3/uL (ref 0.9–3.3)
Lymphocytes Relative: 34 %
MCH: 32.5 pg (ref 28.0–33.4)
MCHC: 35.3 g/dL (ref 32.0–35.9)
MCV: 92.1 fL (ref 82.0–98.0)
Monocytes Absolute: 0.6 10*3/uL (ref 0.1–0.9)
Monocytes Relative: 13 %
Neutro Abs: 2.4 10*3/uL (ref 1.5–6.5)
Neutrophils Relative %: 48 %
PLATELETS: 114 10*3/uL — AB (ref 145–400)
RBC: 5.32 MIL/uL (ref 4.20–5.70)
RDW: 13.7 % (ref 11.1–15.7)
WBC: 5 10*3/uL (ref 4.0–10.0)

## 2018-04-29 LAB — D-DIMER, QUANTITATIVE: D-Dimer, Quant: 0.3 ug/mL-FEU (ref 0.00–0.50)

## 2018-04-29 NOTE — Progress Notes (Signed)
Hematology and Oncology Follow Up Visit  Ian [name] [id number] February 01, 1961 57 y.o. 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    Interim History:  Ian [name] is here today for a follow-up.  He is doing pretty well.  He had his shoulder surgery for the right shoulder back in December.  He got through this without any difficulty.  He is working.  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  He has had no problems with cough or shortness of breath.  He has had no issues with leg swelling.  I told him to make sure that he drinks a lot of water and uses sunscreen.  There is been no change in bowel or bladder habits.  He has had no bleeding.  Overall, his performance status is ECOG 1.  Medications:  Allergies as of 04/29/2018      Reactions   Penicillins Rash, Other (See Comments), Diarrhea, Hives   Has patient had a PCN reaction causing immediate rash, facial/tongue/throat swelling, SOB or lightheadedness with hypotension: Unknown Has patient had a PCN reaction causing severe rash involving mucus membranes or skin necrosis: No Has patient had a PCN reaction that required hospitalization: No Has patient had a PCN reaction occurring within the last 10 years: No If all of the above answers are "NO", then may proceed with Cephalosporin use.   Tetanus Toxoid, Adsorbed Anaphylaxis   Tetanus Toxoids Anaphylaxis   Aspirin Nausea Only, Rash   Daptomycin Other (See Comments)   myalgias   Prednisone Other (See Comments), Rash   Hyperactivity WITH ORAL, CAN TAKE SHOTS-Cannot do Dose pack   Bioflavonoids Diarrhea   All citrus fruits   Other    Cheese   Tetanus Immune Globulin    Citrus Diarrhea, Other (See Comments)   All citrus fruits   Fenofibrate Other (See Comments)   Constipation w/anal fissure  [Sx] Other reaction(s): Constipation   Milk-related Compounds Diarrhea, Other (See Comments)   Milk and milk products   Oxycodone Other (See Comments)   Does not help pain just makes him high      Medication List        Accurate as of 04/29/18 10:51 AM. Always use your most recent med list.          ALLERGIST TRAY 1/2CC 27GX1/2" 27G X 1/2" 0.5 ML Kit Generic drug:  Tuberculin-Allergy Syringes Inject 2 each as directed every Wednesday.   clomiPHENE 50 MG tablet Commonly known as:  CLOMID Take 25 mg by mouth daily.   docusate sodium 250 MG capsule Commonly known as:  COLACE Take 250 mg by mouth daily.   [name] Generic drug:  EPINEPHrine Inject 0.3 mg into the skin once.   [name] Take 145 mg by mouth daily.   [name] TAKE 1 TABLET BY MOUTH EVERY DAY   [name] Generic drug:  canagliflozin TAKE 1 TABLET (100 MG TOTAL) BY MOUTH DAILY BEFORE BREAKFAST.   [name] TAKE 1 TABLET (4 MG TOTAL) BY MOUTH DAILY.   [name] Take 1 capsule (20 mg total) by mouth daily.  [name] Take 99 mg by mouth daily. Reported on 01/11/2016   RA POTASSIUM GLUCONATE 595 (99 K) MG Tabs tablet Generic drug:  potassium gluconate Take 2.5 mEq by mouth.   [name] USE AS DIRECTED TO TEST BLOOD GLUCOSE DAILY DX: 250.00   [name] TAKE 2 CAPSULES BY MOUTH TWICE A DAY AS DIRECTED   [name] Take 5,000 Units by mouth daily.   XARELTO 10 MG Tabs tablet Generic drug:  rivaroxaban TAKE 1 TABLET BY MOUTH EVERY DAY   [name] Take 1 tablet (5 mg total) by mouth at bedtime as needed for sleep.        Allergies:  Allergies  Allergen Reactions  . Penicillins Rash, Other (See Comments), Diarrhea and Hives    Has patient had a PCN reaction causing immediate rash, facial/tongue/throat swelling, SOB or lightheadedness with hypotension: Unknown Has patient had a PCN reaction causing severe rash involving mucus membranes or skin necrosis: No Has patient had a PCN reaction that required hospitalization: No Has patient had a PCN reaction occurring within the last 10 years: No If all of the above answers are "NO", then may proceed with Cephalosporin use.   . Tetanus Toxoid, Adsorbed Anaphylaxis  . Tetanus Toxoids Anaphylaxis  . Aspirin Nausea Only and Rash  . Daptomycin Other (See Comments)    myalgias  . Prednisone Other (See Comments) and Rash    Hyperactivity WITH ORAL, CAN TAKE SHOTS-Cannot do Dose pack  . Bioflavonoids Diarrhea    All citrus fruits  . Other     Cheese  . Tetanus Immune Globulin   . Citrus Diarrhea and Other (See Comments)    All citrus fruits  . Fenofibrate Other (See Comments)    Constipation w/anal fissure [Sx] Other reaction(s): Constipation  . Milk-Related Compounds Diarrhea and Other (See Comments)    Milk and milk products  . Oxycodone Other (See Comments)    Does not help pain just makes him high    Past Medical History, Surgical history, Social history, and Family History were reviewed and updated.  Review of Systems: Review of Systems  Constitutional: Negative.   HENT: Negative.   Eyes: Negative.   Respiratory: Negative.   Cardiovascular: Negative.   Gastrointestinal: Negative.   Genitourinary: Negative.   Musculoskeletal: Positive for joint pain.  Skin: Negative.   Neurological: Negative.   Endo/Heme/Allergies: Negative.   Psychiatric/Behavioral: Negative.      Physical Exam:  weight is 241 lb (109.3 kg). His oral temperature is 98.3 F (36.8 C). His blood pressure is 134/80 and his pulse is 80. His respiration is 18 and oxygen  saturation is 99%.   Wt Readings from Last 3 Encounters:  04/29/18 241 lb (109.3 kg)  12/13/17 230 lb (104.3 kg)  12/10/17 230 lb (104.3 kg)    Physical Exam  Constitutional: He is oriented to person, place, and time.  HENT:  Head: Normocephalic and atraumatic.  Mouth/Throat: Oropharynx is clear and moist.  Eyes: Pupils are equal, round, and reactive to light. EOM are normal.  Neck: Normal range of motion.  Cardiovascular: Normal rate, regular rhythm and normal heart sounds.  Pulmonary/Chest: Effort normal and breath sounds normal.  Abdominal: Soft. Bowel sounds are normal.  Musculoskeletal: Normal range of motion. He exhibits no edema, tenderness or deformity.  Lymphadenopathy:    He has no cervical adenopathy.  Neurological:  He is alert and oriented to person, place, and time.  Skin: Skin is warm and dry. No rash noted. No erythema.  Psychiatric: He has a normal mood and affect. His behavior is normal. Judgment and thought content normal.  Vitals reviewed.    Lab Results  Component Value Date   WBC 5.0 04/29/2018   HGB 17.3 (H) 04/29/2018   HCT 49.0 04/29/2018   MCV 92.1 04/29/2018   PLT 114 (L) 04/29/2018   No results found for: FERRITIN, IRON, TIBC, UIBC, IRONPCTSAT Lab Results  Component Value Date   RBC 5.32 04/29/2018   No results found for: KPAFRELGTCHN, LAMBDASER, KAPLAMBRATIO No results found for: Kandis Cocking, IGMSERUM No results found for: Odetta Pink, SPEI   Chemistry      Component Value Date/Time   NA 137 12/14/2017 0607   NA 142 10/29/2017 1027   NA 144 04/30/2017 1001   K 4.4 12/14/2017 0607   K 3.8 10/29/2017 1027   K 3.9 04/30/2017 1001   CL 105 12/14/2017 0607   CL 102 10/29/2017 1027   CO2 27 12/14/2017 0607   CO2 31 10/29/2017 1027   CO2 30 (H) 04/30/2017 1001   BUN 16 12/14/2017 0607   BUN 16 10/29/2017 1027   BUN 14.8 04/30/2017 1001   CREATININE 0.95 12/14/2017 0607    CREATININE 1.3 (H) 10/29/2017 1027   CREATININE 1.1 04/30/2017 1001      Component Value Date/Time   CALCIUM 8.6 (L) 12/14/2017 0607   CALCIUM 9.5 10/29/2017 1027   CALCIUM 9.6 04/30/2017 1001   ALKPHOS 39 10/29/2017 1027   ALKPHOS 50 04/30/2017 1001   AST 32 10/29/2017 1027   AST 26 04/30/2017 1001   ALT 29 10/29/2017 1027   ALT 28 04/30/2017 1001   BILITOT 1.20 10/29/2017 1027   BILITOT 1.06 04/30/2017 1001     Impression and Plan: Mr. Duque is a 57 yo white male with thrombus in the left leg and bilateral PE's. PE's had resolved on CT angio in August 2016. 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 He developed his thrombus after having surgery and being immobile for an extended period of time.   He is doing well with the Xarelto.  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  At this point, I think we can let him go from the clinic.  He we really are not doing much for his medical care right now.  He is doing well with low-dose Xarelto.  I told him that if he ever has any problems that he can always come back to see [place] and we will certainly get him in to see what we can do.  Again, I told him to make sure that he drinks a lot of water and stays well-hydrated particularly while working during the summer months.  Volanda Napoleon, MD 5/6/201910:51 AM

## 2018-05-02 ENCOUNTER — Other Ambulatory Visit: Payer: Self-pay | Admitting: Family Medicine

## 2018-05-12 ENCOUNTER — Other Ambulatory Visit: Payer: Self-pay | Admitting: Family Medicine

## 2018-05-27 ENCOUNTER — Ambulatory Visit (INDEPENDENT_AMBULATORY_CARE_PROVIDER_SITE_OTHER): Payer: BLUE CROSS/BLUE SHIELD | Admitting: Family Medicine

## 2018-05-27 ENCOUNTER — Encounter: Payer: Self-pay | Admitting: Family Medicine

## 2018-05-27 VITALS — BP 122/76 | HR 81 | Temp 98.7°F | Ht 68.0 in | Wt 242.2 lb

## 2018-05-27 DIAGNOSIS — Z1159 Encounter for screening for other viral diseases: Secondary | ICD-10-CM

## 2018-05-27 DIAGNOSIS — I10 Essential (primary) hypertension: Secondary | ICD-10-CM

## 2018-05-27 DIAGNOSIS — E119 Type 2 diabetes mellitus without complications: Secondary | ICD-10-CM

## 2018-05-27 DIAGNOSIS — E782 Mixed hyperlipidemia: Secondary | ICD-10-CM

## 2018-05-27 LAB — COMPREHENSIVE METABOLIC PANEL
ALBUMIN: 4 g/dL (ref 3.5–5.2)
ALK PHOS: 42 U/L (ref 39–117)
ALT: 25 U/L (ref 0–53)
AST: 28 U/L (ref 0–37)
BILIRUBIN TOTAL: 0.8 mg/dL (ref 0.2–1.2)
BUN: 15 mg/dL (ref 6–23)
CO2: 30 mEq/L (ref 19–32)
CREATININE: 1.08 mg/dL (ref 0.40–1.50)
Calcium: 9.6 mg/dL (ref 8.4–10.5)
Chloride: 98 mEq/L (ref 96–112)
GFR: 74.45 mL/min (ref >60.00)
GLUCOSE: 103 mg/dL — AB (ref 70–99)
Potassium: 3.4 mEq/L — ABNORMAL LOW (ref 3.5–5.1)
SODIUM: 137 meq/L (ref 135–145)
TOTAL PROTEIN: 6.5 g/dL (ref 6.0–8.3)

## 2018-05-27 LAB — LDL CHOLESTEROL, DIRECT: LDL DIRECT: 41 mg/dL

## 2018-05-27 LAB — HEMOGLOBIN A1C: Hgb A1c MFr Bld: 6.8 % — ABNORMAL HIGH (ref 4.6–6.5)

## 2018-05-27 LAB — LIPID PANEL
CHOL/HDL RATIO: 13
CHOLESTEROL: 325 mg/dL — AB (ref 0–200)
HDL: 25.6 mg/dL — ABNORMAL LOW (ref >39.00)
Triglycerides: 1706 mg/dL — ABNORMAL HIGH (ref 0.0–149.0)

## 2018-05-27 MED ORDER — OMEPRAZOLE 20 MG PO CPDR: 20.0000 mg | DELAYED_RELEASE_CAPSULE | Freq: Every day | ORAL | 3 refills | Status: DC

## 2018-05-27 NOTE — Progress Notes (Signed)
Subjective:   Chief Complaint  Patient presents with  . Follow-up    6 month    Ian [name] is a 57 y.o. male here for follow-up of diabetes.   [name]'s self monitored glucose range is 90-low 100's.  Patient denies hypoglycemic reactions. He checks his glucose levels several times per week. Patient does not require insulin.   Medications include: Invokana 100 mg/d Reports diet is healthy overall. Patient exercises 0 days per week on average (bad back).  Hypertension Patient presents for hypertension follow up. He does not monitor home blood pressures. He is compliant with medications. Patient has these side effects of medication: none He is adhering to a healthy diet overall. Exercise: None lately 2/2 back issues  Hyperlipidemia Patient presents for dyslipidemia follow up. Currently being treated with Livalo 4 mg/d and compliance with treatment thus far has been good. He denies myalgias. He is sometimes adhering to a healthy. The patient exercises never.  The patient is not known to have coexisting coronary artery disease.   Past Medical History:  Diagnosis Date  . Allergy   . Arthritis   . Biceps tendon rupture    bilateral  . Clostridium difficile infection 2014  . Colon polyps   . Diabetes ([place])    Borderline/Pre-diabetes  . GERD (gastroesophageal reflux disease)   . History of DVT (deep vein thrombosis)   . History of pulmonary embolism   . Hyperlipidemia   . Hypertension   . Internal bleeding hemorrhoids 2012   "might bleed once/month; only when I strain"  . Internal prolapsed hemorrhoids   . Leg cramps   . Testicular hypofunction   . Vertigo   . Wart of face    Right cheek    Past Surgical History:  Procedure Laterality Date  . ANAL FISSURE REPAIR  06/24/2013  . APPENDECTOMY  2011  . COLONOSCOPY W/ BIOPSIES AND POLYPECTOMY  2012  . COLONSCOPY  2015  . EXCISIONAL HEMORRHOIDECTOMY    . FISSURECTOMY    . HEMORRHOID SURGERY  06/24/2013  .  internal sphincterotomy  06-24-2013  . LUMBAR LAMINECTOMY/DECOMPRESSION MICRODISCECTOMY N/A 01/20/2015   Procedure: MICRO LUMBAR DECOMPRESSION L4-5/L3-4/L2-3;  Surgeon: Johnn Hai, MD;  Location: [facility];  Service: Orthopedics;  Laterality: N/A;  . SHOULDER ARTHROSCOPY  08/22/2012   Procedure: ARTHROSCOPY SHOULDER;  Surgeon: Marin Shutter, MD;  Location: [facility];  Service: Orthopedics;  Laterality: Left;  Left shoulder arthroscopic lavage and debridement  . SHOULDER ARTHROSCOPY  09/02/2012   Procedure: ARTHROSCOPY SHOULDER;  Surgeon: Marin Shutter, MD;  Location: [facility];  Service: Orthopedics;  Laterality: Left;  Left shoulder arthroscopy irrigation and debridement  . SHOULDER ARTHROSCOPY W/ ROTATOR CUFF REPAIR  07/09/2012   left  . SHOULDER OPEN ROTATOR CUFF REPAIR Right 12/13/2017   Procedure: Right shoulder mini open rotator cuff repair;  Surgeon: Susa Day, MD;  Location: [facility];  Service: Orthopedics;  Laterality: Right;  Interscalene Block  . SKIN LESION EXCISION     Benign  . WISDOM TOOTH EXTRACTION  YRS AGO    Family History  Problem Relation Age of Onset  . Heart disease Mother 43       Deceased  . Hyperlipidemia Mother   . Hypertension Mother   . Heart disease Father 73       Deceased  . CVA Father   . Hypertension Maternal Grandmother   . Hyperlipidemia Maternal Grandmother   . Cancer Maternal Grandmother        Colon Cancer  .  Heart disease Maternal Grandfather   . Hypertension Maternal Grandfather   . Hyperlipidemia Maternal Grandfather   . Cancer Maternal Grandfather        Lung Cancer  . Heart attack Maternal Grandfather   . Heart disease Paternal Grandfather    Current Outpatient Medications on File Prior to Visit  Medication Sig Dispense Refill  . ALLERGIST TRAY 1/2CC 27GX1/2" 27G X 1/2" 0.5 ML KIT Inject 2 each as directed every Wednesday.  6  . Blood Glucose Monitoring Suppl (RELION CONFIRM GLUCOSE MONITOR) W/DEVICE KIT USE AS DIRECTED TO TEST BLOOD GLUCOSE  DAILY DX: 250.00 1 kit 0  . Cholecalciferol (VITAMIN D-3) 5000 UNITS TABS Take 5,000 Units by mouth daily.     . clomiPHENE (CLOMID) 50 MG tablet Take 25 mg by mouth daily.  11  . docusate sodium (COLACE) 250 MG capsule Take 250 mg by mouth daily.     Marland Kitchen EPIPEN 2-PAK 0.3 MG/0.3ML SOAJ injection Inject 0.3 mg into the skin once.     . gabapentin (NEURONTIN) 300 MG capsule Take 300 mg by mouth 3 (three) times daily.    . hydrochlorothiazide (HYDRODIURIL) 25 MG tablet TAKE 1 TABLET BY MOUTH EVERY DAY 90 tablet 1  . [name] TAKE 1 TABLET (100 MG TOTAL) BY MOUTH DAILY BEFORE BREAKFAST. 30 tablet 5  . LIVALO 4 MG TABS TAKE 1 TABLET BY MOUTH EVERY DAY 30 tablet 10  . [name] Take 99 mg by mouth daily. Reported on 01/11/2016    . potassium gluconate (RA POTASSIUM GLUCONATE) 595 (99 K) MG TABS tablet Take 2.5 mEq by mouth.    Marland Kitchen VASCEPA 1 g CAPS TAKE 2 CAPSULES BY MOUTH TWICE A DAY AS DIRECTED 120 capsule 3  . XARELTO 10 MG TABS tablet TAKE 1 TABLET BY MOUTH EVERY DAY 30 tablet 6  . zolpidem (AMBIEN) 5 MG tablet Take 1 tablet (5 mg total) by mouth at bedtime as needed for sleep. 30 tablet 0   Related testing: Date of retinal exam: 09/2017  Done by:  Dr. Posey Pronto Pneumovax: done  Review of Systems: Pulmonary:  No SOB Cardiovascular:  No chest pain  Objective:  BP 122/76 (BP Location: Left Arm, Patient Position: Sitting, Cuff Size: Large)   Pulse 81   Temp 98.7 F (37.1 C) (Oral)   Ht _0  (1.727 m)   Wt 242 lb 4 oz (109.9 kg)   SpO2 95%   BMI 36.83 kg/m  General:  Well developed, well nourished, in no apparent distress Skin:  Warm, no pallor or diaphoresis Head:  Normocephalic, atraumatic Eyes:  Pupils equal and round, sclera anicteric without injection  Lungs:  CTAB, no access msc use Cardio:  RRR, no bruits, no LE edema Musculoskeletal:  Symmetrical muscle groups noted without atrophy or deformity Neuro:  Sensation intact to pinprick on feet Psych: Age  appropriate judgment and insight  Assessment:   Type 2 diabetes mellitus without complication, without long-term current use of insulin (HCC) - Plan: Hemoglobin A1c, Lipid panel, Comprehensive metabolic panel, HM Diabetes Foot Exam  Essential hypertension, benign  Mixed hyperlipidemia  Encounter for hepatitis C screening test for low risk patient - Plan: Hepatitis C antibody   Plan:   Orders as above. Counseled on diet and exercise.  Having back issues making it difficult to exercise.  He is following with a specialist. He did ask about increasing his Invokana, however given his good control in the past and home readings, he likely  does not need this increase. F/u in 6 mo. The patient voiced understanding and agreement to the plan.  [place], DO 05/27/18 10:35 AM

## 2018-05-27 NOTE — Patient Instructions (Signed)
Keep the diet clean. Try to stay active.  Let us know if you need anything.

## 2018-05-27 NOTE — Progress Notes (Signed)
Pre visit review using our clinic review tool, if applicable. No additional management support is needed unless otherwise documented below in the visit note. 

## 2018-05-28 LAB — HEPATITIS C ANTIBODY
HEP C AB: NONREACTIVE
SIGNAL TO CUT-OFF: 0.01 (ref <1.00)

## 2018-05-29 ENCOUNTER — Other Ambulatory Visit: Payer: Self-pay | Admitting: Family Medicine

## 2018-05-29 ENCOUNTER — Other Ambulatory Visit (INDEPENDENT_AMBULATORY_CARE_PROVIDER_SITE_OTHER): Payer: BLUE CROSS/BLUE SHIELD

## 2018-05-29 DIAGNOSIS — E876 Hypokalemia: Secondary | ICD-10-CM

## 2018-05-29 DIAGNOSIS — E781 Pure hyperglyceridemia: Secondary | ICD-10-CM

## 2018-05-30 ENCOUNTER — Encounter: Payer: Self-pay | Admitting: Family Medicine

## 2018-05-30 ENCOUNTER — Other Ambulatory Visit (INDEPENDENT_AMBULATORY_CARE_PROVIDER_SITE_OTHER): Payer: BLUE CROSS/BLUE SHIELD

## 2018-05-30 DIAGNOSIS — E781 Pure hyperglyceridemia: Secondary | ICD-10-CM | POA: Diagnosis not present

## 2018-05-30 LAB — BASIC METABOLIC PANEL
BUN: 19 mg/dL (ref 6–23)
CHLORIDE: 100 meq/L (ref 96–112)
CO2: 28 meq/L (ref 19–32)
CREATININE: 1.06 mg/dL (ref 0.40–1.50)
Calcium: 9.7 mg/dL (ref 8.4–10.5)
GFR: 76.07 mL/min (ref >60.00)
GLUCOSE: 192 mg/dL — AB (ref 70–99)
POTASSIUM: 4 meq/L (ref 3.5–5.1)
SODIUM: 139 meq/L (ref 135–145)

## 2018-05-30 LAB — LIPID PANEL
Cholesterol: 253 mg/dL — ABNORMAL HIGH (ref 0–200)
HDL: 26.7 mg/dL — ABNORMAL LOW (ref >39.00)
Total CHOL/HDL Ratio: 9
Triglycerides: 967 mg/dL — ABNORMAL HIGH (ref 0.0–149.0)

## 2018-05-30 LAB — LDL CHOLESTEROL, DIRECT: Direct LDL: 38 mg/dL

## 2018-05-31 ENCOUNTER — Other Ambulatory Visit: Payer: Self-pay | Admitting: Family Medicine

## 2018-05-31 MED ORDER — FENOFIBRATE 145 MG PO TABS: 145.0000 mg | ORAL_TABLET | Freq: Every day | ORAL | 11 refills | Status: DC

## 2018-06-02 ENCOUNTER — Other Ambulatory Visit: Payer: Self-pay

## 2018-06-02 ENCOUNTER — Encounter: Payer: Self-pay | Admitting: Emergency Medicine

## 2018-06-02 ENCOUNTER — Emergency Department (INDEPENDENT_AMBULATORY_CARE_PROVIDER_SITE_OTHER)
Admission: EM | Admit: 2018-06-02 | Discharge: 2018-06-02 | Disposition: A | Discharge status: Home / Self Care | Payer: BLUE CROSS/BLUE SHIELD | Source: Home / Self Care | Attending: Family Medicine | Admitting: Family Medicine

## 2018-06-02 DIAGNOSIS — H01004 Unspecified blepharitis left upper eyelid: Secondary | ICD-10-CM | POA: Diagnosis not present

## 2018-06-02 MED ORDER — AZITHROMYCIN 1 % OP SOLN: OPHTHALMIC | 0 refills | Status: DC

## 2018-06-02 MED ORDER — DOXYCYCLINE HYCLATE 100 MG PO CAPS: 100.0000 mg | ORAL_CAPSULE | Freq: Two times a day (BID) | ORAL | 0 refills | Status: DC

## 2018-06-02 NOTE — ED Triage Notes (Signed)
Yesterday noticed soreness at outer aspect left eyelid;  As day progressed the lid began to swell and get inflamed; no known injury or insect bite.

## 2018-06-02 NOTE — Discharge Instructions (Addendum)
Apply warm compresses to your eye 2 to 3 times per day for 10 minutes at a time. If symptoms become significantly worse during the night or over the weekend, proceed to the local emergency room.

## 2018-06-02 NOTE — ED Provider Notes (Signed)
[name]    CSN: [id number] Arrival date & time: 06/02/18  1113     History   Chief Complaint Chief Complaint  Patient presents with  . Eye Problem    left    HPI Ian [name] is a 57 y.o. male.   Patient noticed mild swelling and discomfort in his left upper eyelid two days ago.  The swelling has gradually increased. No drainage from the left eye.  No foreign body sensation, and no changes in vision.  The history is provided by the patient.  Eye Problem  Location:  Left eye Quality:  Dull Severity:  Mild Onset quality:  Gradual Duration:  2 days Timing:  Constant Progression:  Worsening Chronicity:  New Context: not contact lens problem, not direct trauma, not foreign body, not using machinery, not scratch, not smoke exposure and not UV exposure   Relieved by:  Nothing Worsened by:  Contact Ineffective treatments: warm compresses. Associated symptoms: inflammation, redness and swelling   Associated symptoms: no blurred vision, no crusting, no decreased vision, no discharge, no double vision, no facial rash, no headaches, no itching, no photophobia and no tearing   Risk factors: no recent URI     Past Medical History:  Diagnosis Date  . Allergy   . Arthritis   . Biceps tendon rupture    bilateral  . Clostridium difficile infection 2014  . Colon polyps   . Diabetes ([place])    Borderline/Pre-diabetes  . GERD (gastroesophageal reflux disease)   . History of DVT (deep vein thrombosis)   . History of pulmonary embolism   . Hyperlipidemia   . Hypertension   . Internal bleeding hemorrhoids 2012   "might bleed once/month; only when I strain"  . Internal prolapsed hemorrhoids   . Leg cramps   . Testicular hypofunction   . Vertigo   . Wart of face    Right cheek    Patient Active Problem List   Diagnosis Date Noted  . Complete rotator cuff tear 12/13/2017  . Claudication ([place]) 09/13/2017  . Allergy to pollen 11/06/2016  . Pulmonary embolism,  bilateral ([place])   . Lower leg DVT (deep venous thromboembolism), acute ([place])   . Spinal stenosis of lumbar region at multiple levels 01/20/2015  . Screening, ischemic heart disease 01/03/2015  . Hypertriglyceridemia 08/17/2014  . Obesity 08/17/2014  . Type 2 diabetes mellitus without complication, without long-term current use of insulin ([place]) 07/29/2014  . Hx of adenomatous colonic polyps 06/03/2014  . Essential hypertension, benign 09/13/2013  . Thrombocytopenia ([place]) 09/13/2013  . HTN (hypertension) 05/13/2013  . Hyperlipidemia   . Testicular hypofunction   . Biceps tendon rupture   . GERD (gastroesophageal reflux disease)   . Colon polyps     Past Surgical History:  Procedure Laterality Date  . ANAL FISSURE REPAIR  06/24/2013  . APPENDECTOMY  2011  . COLONOSCOPY W/ BIOPSIES AND POLYPECTOMY  2012  . COLONSCOPY  2015  . EXCISIONAL HEMORRHOIDECTOMY    . FISSURECTOMY    . HEMORRHOID SURGERY  06/24/2013  . internal sphincterotomy  06-24-2013  . LUMBAR LAMINECTOMY/DECOMPRESSION MICRODISCECTOMY N/A 01/20/2015   Procedure: MICRO LUMBAR DECOMPRESSION L4-5/L3-4/L2-3;  Surgeon: Johnn Hai, MD;  [facility];  Service: Orthopedics;  Laterality: N/A;  . SHOULDER ARTHROSCOPY  08/22/2012   Procedure: ARTHROSCOPY SHOULDER;  Surgeon: Marin Shutter, MD;  Location: [facility];  Service: Orthopedics;  Laterality: Left;  Left shoulder arthroscopic lavage and debridement  . SHOULDER ARTHROSCOPY  09/02/2012  Procedure: ARTHROSCOPY SHOULDER;  Surgeon: Marin Shutter, MD;  Location: [employer];  Service: Orthopedics;  Laterality: Left;  Left shoulder arthroscopy irrigation and debridement  . SHOULDER ARTHROSCOPY W/ ROTATOR CUFF REPAIR  07/09/2012   left  . SHOULDER OPEN ROTATOR CUFF REPAIR Right 12/13/2017   Procedure: Right shoulder mini open rotator cuff repair;  Surgeon: Susa Day, MD;  [facility];  Service: Orthopedics;  Laterality: Right;  Interscalene Block  . SKIN LESION EXCISION      Benign  . WISDOM TOOTH EXTRACTION  YRS AGO       Home Medications    Prior to Admission medications   Medication Sig Start Date End Date Taking? Authorizing Provider  ALLERGIST TRAY 1/2CC 27GX1/2" 27G X 1/2" 0.5 ML KIT Inject 2 each as directed every Wednesday. 09/19/17   [provider]  azithromycin (AZASITE) 1 % ophthalmic solution Place one gtt in left eye bid for two days, then once daily for 5 days. 06/02/18   Kandra Nicolas, MD  Blood Glucose Monitoring Suppl (RELION CONFIRM GLUCOSE MONITOR) W/DEVICE KIT USE AS DIRECTED TO TEST BLOOD GLUCOSE DAILY DX: 250.00 08/13/14   Brunetta Jeans, PA-C  Cholecalciferol (VITAMIN D-3) 5000 UNITS TABS Take 5,000 Units by mouth daily.     [provider]  clomiPHENE (CLOMID) 50 MG tablet Take 25 mg by mouth daily. 04/02/17   [provider]  docusate sodium (COLACE) 250 MG capsule Take 250 mg by mouth daily.     [provider]  doxycycline (VIBRAMYCIN) 100 MG capsule Take 1 capsule (100 mg total) by mouth 2 (two) times daily. Take with food. 06/02/18   Kandra Nicolas, MD  EPIPEN 2-PAK 0.3 MG/0.3ML SOAJ injection Inject 0.3 mg into the skin once.  04/19/15   [provider]  fenofibrate (TRICOR) 145 MG tablet Take 1 tablet (145 mg total) by mouth daily. 05/31/18   Shelda Pal, DO  gabapentin (NEURONTIN) 300 MG capsule Take 300 mg by mouth 3 (three) times daily.    [provider]  hydrochlorothiazide (HYDRODIURIL) 25 MG tablet TAKE 1 TABLET BY MOUTH EVERY DAY 04/11/18   Wendling, Crosby Oyster, DO  [name] TAKE 1 TABLET (100 MG TOTAL) BY MOUTH DAILY BEFORE BREAKFAST. 01/07/18   Shelda Pal, DO  LIVALO 4 MG TABS TAKE 1 TABLET BY MOUTH EVERY DAY 05/02/18   Shelda Pal, DO  omeprazole (PRILOSEC) 20 MG capsule Take 1 capsule (20 mg total) by mouth daily. 05/27/18   Shelda Pal, DO  [name] Take 99 mg by mouth daily. Reported on  01/11/2016    [provider]  potassium gluconate (RA POTASSIUM GLUCONATE) 595 (99 K) MG TABS tablet Take 2.5 mEq by mouth.    [provider]  VASCEPA 1 g CAPS TAKE 2 CAPSULES BY MOUTH TWICE A DAY AS DIRECTED 05/13/18   Wendling, Crosby Oyster, DO  XARELTO 10 MG TABS tablet TAKE 1 TABLET BY MOUTH EVERY DAY 12/24/17   Volanda Napoleon, MD  zolpidem (AMBIEN) 5 MG tablet Take 1 tablet (5 mg total) by mouth at bedtime as needed for sleep. 06/25/17   Shelda Pal, DO    Family History Family History  Problem Relation Age of Onset  . Heart disease Mother 19       Deceased  . Hyperlipidemia Mother   . Hypertension Mother   . Heart disease Father 80       Deceased  .  CVA Father   . Hypertension Maternal Grandmother   . Hyperlipidemia Maternal Grandmother   . Cancer Maternal Grandmother        Colon Cancer  . Heart disease Maternal Grandfather   . Hypertension Maternal Grandfather   . Hyperlipidemia Maternal Grandfather   . Cancer Maternal Grandfather        Lung Cancer  . Heart attack Maternal Grandfather   . Heart disease Paternal Grandfather     Social History Social History   Tobacco Use  . Smoking status: Never Smoker  . Smokeless tobacco: Never Used  . Tobacco comment: NEVER USED TOBACCO  Substance Use Topics  . Alcohol use: No    Alcohol/week: 0.0 oz  . Drug use: No     Allergies   Penicillins; Tetanus toxoid, adsorbed; Tetanus toxoids; Aspirin; Daptomycin; Prednisone; Bioflavonoids; Other; Tetanus immune globulin; Citrus; Fenofibrate; Milk-related compounds; and Oxycodone   Review of Systems Review of Systems  Eyes: Positive for redness. Negative for blurred vision, double vision, photophobia, discharge and itching.  Neurological: Negative for headaches.  All other systems reviewed and are negative.    Physical Exam Triage Vital Signs ED Triage Vitals  Enc Vitals Group     BP 06/02/18 1135 117/78     Pulse Rate 06/02/18 1135 76      Resp 06/02/18 1135 16     Temp 06/02/18 1135 98.8 F (37.1 C)     Temp Source 06/02/18 1135 Oral     SpO2 06/02/18 1135 96 %     Weight 06/02/18 1136 235 lb (106.6 kg)     Height 06/02/18 1136 _0  (1.727 m)     Head Circumference --      Peak Flow --      Pain Score 06/02/18 1136 2     Pain Loc --      Pain Edu? --      Excl. in [place]? --    No data found.  Updated Vital Signs BP 117/78 (BP Location: Right Arm)   Pulse 76   Temp 98.8 F (37.1 C) (Oral)   Resp 16   Ht _1  (1.727 m)   Wt 235 lb (106.6 kg)   SpO2 96%   BMI 35.73 kg/m   Visual Acuity Right Eye Distance: 20/20 Left Eye Distance: 20/25 Bilateral Distance: without correction  Right Eye Near:   Left Eye Near:    Bilateral Near:     Physical Exam  Constitutional: He appears well-developed and well-nourished. No distress.  HENT:  Head: Atraumatic.  Right Ear: Tympanic membrane, external ear and ear canal normal.  Left Ear: Tympanic membrane, external ear and ear canal normal.  Nose: Nose normal.  Eyes: Pupils are equal, round, and reactive to light. Conjunctivae and EOM are normal. Lids are everted and swept, no foreign bodies found. Right eye exhibits no discharge. Left eye exhibits no chemosis, no discharge, no exudate and no hordeolum. No foreign body present in the left eye.    Left upper eyelid mildly swollen, erythematous, and tender to palpation without localized nodule.  Neck: Neck supple.  Cardiovascular: Normal rate.  Pulmonary/Chest: Effort normal.  Lymphadenopathy:    He has no cervical adenopathy.  Neurological: He is alert.  Skin: Skin is warm and dry.  Nursing note and vitals reviewed.    UC Treatments / Results  Labs (all labs ordered are listed, but only abnormal results are displayed) Labs Reviewed - No data to display  EKG None  Radiology No results  found.  Procedures Procedures (including critical care time)  Medications Ordered in UC Medications - No data to  display  Initial Impression / Assessment and Plan / UC Course  I have reviewed the triage vital signs and the nursing notes.  Pertinent labs & imaging results that were available during my care of the patient were reviewed by me and considered in my medical decision making (see chart for details).    Possible early preseptal cellulitis.  Begin azithromycin ophthalmic suspension, and oral tetracycline for staph coverage. Followup with ophthalmologist if not improved about 4 to 5 days.   Final Clinical Impressions(s) / UC Diagnoses   Final diagnoses:  Blepharitis of left upper eyelid, unspecified type     Discharge Instructions     Apply warm compresses to your eye 2 to 3 times per day for 10 minutes at a time. If symptoms become significantly worse during the night or over the weekend, proceed to the local emergency room.     ED Prescriptions    Medication Sig Dispense Auth. Provider   azithromycin (AZASITE) 1 % ophthalmic solution Place one gtt in left eye bid for two days, then once daily for 5 days. 2.5 mL Kandra Nicolas, MD   doxycycline (VIBRAMYCIN) 100 MG capsule Take 1 capsule (100 mg total) by mouth 2 (two) times daily. Take with food. 14 capsule Kandra Nicolas, MD        Kandra Nicolas, MD 06/02/18 [phone]

## 2018-06-05 ENCOUNTER — Ambulatory Visit (INDEPENDENT_AMBULATORY_CARE_PROVIDER_SITE_OTHER): Payer: BLUE CROSS/BLUE SHIELD | Admitting: Family Medicine

## 2018-06-05 ENCOUNTER — Encounter: Payer: Self-pay | Admitting: Family Medicine

## 2018-06-05 VITALS — BP 120/76 | HR 75 | Temp 97.8°F | Ht 68.0 in | Wt 238.5 lb

## 2018-06-05 DIAGNOSIS — E781 Pure hyperglyceridemia: Secondary | ICD-10-CM

## 2018-06-05 DIAGNOSIS — H44002 Unspecified purulent endophthalmitis, left eye: Secondary | ICD-10-CM

## 2018-06-05 MED ORDER — CANAGLIFLOZIN 100 MG PO TABS: 200.0000 mg | ORAL_TABLET | Freq: Every day | ORAL | 1 refills | Status: DC

## 2018-06-05 MED ORDER — RIVAROXABAN 20 MG PO TABS: 20.0000 mg | ORAL_TABLET | Freq: Every day | ORAL | 1 refills | Status: DC

## 2018-06-05 MED ORDER — ROSUVASTATIN CALCIUM 20 MG PO TABS: 20.0000 mg | ORAL_TABLET | Freq: Every day | ORAL | 1 refills | Status: DC

## 2018-06-05 NOTE — Patient Instructions (Addendum)
Consider the [facility] and [facility] for your care. They have more resources available for uninsured patients and may be better able to provide you the care you need.  [place], [place] [address] [phone]   Continue using compresses. Massage the eyelid on top. Massage in a downwards direction.   Artificial tears like Refresh and Systane may be used for comfort. OK to get generic version. Generally people use them every 2-4 hours, but you can use them as much as you want because there is no medication in it.  Finish your other meds from the urgent care.  Let us know if meds are too expensive.   Let us know if you need anything.

## 2018-06-05 NOTE — Progress Notes (Signed)
Chief Complaint  Patient presents with  . Follow-up    eye infection    Subjective: Patient is a 57 y.o. male here for UC f/u.  Pt was seen by UC for eye infection, placed on abx drops and doxy. Doing better overall. Still itching, a little sore. No pain or drianage, not having fevers, eye pain or vision changes.   Would like to review medications to maximize cost efficacy as he is losing his insurance soon.   ROS: Heart: Denies chest pain  Lungs: Denies SOB    Past Medical History:  Diagnosis Date  . Allergy   . Arthritis   . Biceps tendon rupture    bilateral  . Clostridium difficile infection 2014  . Colon polyps   . Diabetes ([place])    Borderline/Pre-diabetes  . GERD (gastroesophageal reflux disease)   . History of DVT (deep vein thrombosis)   . History of pulmonary embolism   . Hyperlipidemia   . Hypertension   . Internal bleeding hemorrhoids 2012   "might bleed once/month; only when I strain"  . Internal prolapsed hemorrhoids   . Leg cramps   . Testicular hypofunction   . Vertigo   . Wart of face    Right cheek   Objective: BP 120/76 (BP Location: Left Arm, Patient Position: Sitting, Cuff Size: Large)   Pulse 75   Temp 97.8 F (36.6 C) (Oral)   Ht 5\' 8"  (1.727 m)   Wt 238 lb 8 oz (108.2 kg)   SpO2 95%   BMI 36.26 kg/m  General: Awake, appears stated age Eyes: EOMi, no injection, +erythema on upper L eyelid. No ttp. No ttp over light pressure over globes.  Heart: RRR, no murmurs Lungs: CTAB, no rales, wheezes or rhonchi. No accessory muscle use Abd: BS+, soft, NT, ND, no masses or organomegaly Psych: Age appropriate judgment and insight, normal affect and mood  Assessment and Plan: Infection of left eye  Hypertriglyceridemia  Orders as above. Looks like hordeolum, will cont to get better. OK to finish tx from UC.  Stop Livalo, pt reports Crestor would be more affordable, could also do pravastatin as this is on [facility] $4 list.  F/u as originally  scheduled, sooner if eye starts to worsen or fails to get better. The patient voiced understanding and agreement to the plan.  [place], DO 06/05/18  3:17 PM

## 2018-06-05 NOTE — Progress Notes (Signed)
Pre visit review using our clinic review tool, if applicable. No additional management support is needed unless otherwise documented below in the visit note. 

## 2018-06-28 ENCOUNTER — Other Ambulatory Visit: Payer: Self-pay | Admitting: Family Medicine

## 2018-10-07 ENCOUNTER — Other Ambulatory Visit: Payer: Self-pay | Admitting: Family Medicine

## 2018-10-15 ENCOUNTER — Telehealth: Payer: Self-pay | Admitting: Family Medicine

## 2018-10-15 DIAGNOSIS — E119 Type 2 diabetes mellitus without complications: Secondary | ICD-10-CM

## 2018-10-15 DIAGNOSIS — I824Z2 Acute embolism and thrombosis of unspecified deep veins of left distal lower extremity: Secondary | ICD-10-CM

## 2018-10-15 NOTE — Telephone Encounter (Signed)
Copied from [place] [id number]. Topic: Quick Communication - See Telephone Encounter >> Oct 15, 2018 10:54 AM Rosalin Hawking wrote: CRM for notification. See Telephone encounter for: 10/15/18.   Pt dropped off document to be filled out by provider (Patient Assistance Program Application) Pt states does not have health insurance and pt takes meds that cost so much that is needing the assistance program since pt is needing his meds. Provider already knows about pt's situation (pt stated) and that he is needing documents filled out ASAP. Please call pt at [phone] or [phone] when document ready to pick up. Document put at front office tray under providers name.

## 2018-10-16 MED ORDER — RIVAROXABAN 20 MG PO TABS: 20.0000 mg | ORAL_TABLET | Freq: Every day | ORAL | 11 refills | Status: DC

## 2018-10-16 MED ORDER — CANAGLIFLOZIN 100 MG PO TABS: 100.0000 mg | ORAL_TABLET | Freq: Every day | ORAL | 11 refills | Status: DC

## 2018-10-16 NOTE — Telephone Encounter (Signed)
Completed as much as possible, attached Rx prescriptions with paperwork; forwarded to provider/SLS 10/23

## 2018-10-17 NOTE — Telephone Encounter (Signed)
Pt would like a call back when forms are ready so that he can mail it with his tax forms please call [phone]  Or [phone]

## 2018-10-17 NOTE — Telephone Encounter (Signed)
Pt calling about forms for RX pt states that he has to mail the forms

## 2018-10-21 NOTE — Telephone Encounter (Signed)
Spoke with patient via telephone to inform him paperwork was ready for pick-up during regular business hours. Patient understood & agreed/SLS 10/25

## 2018-11-27 ENCOUNTER — Ambulatory Visit: Payer: BLUE CROSS/BLUE SHIELD | Admitting: Family Medicine

## 2018-11-29 ENCOUNTER — Other Ambulatory Visit: Payer: Self-pay | Admitting: Family Medicine

## 2018-11-29 DIAGNOSIS — E782 Mixed hyperlipidemia: Secondary | ICD-10-CM

## 2018-11-29 NOTE — Telephone Encounter (Signed)
Rosuvastatin rx requested and refilled. Last lipid profile and LOV with PCP 05/2018. Pt. Has upcoming appointment 12/16/18.

## 2018-12-16 ENCOUNTER — Ambulatory Visit (INDEPENDENT_AMBULATORY_CARE_PROVIDER_SITE_OTHER): Payer: Self-pay | Admitting: Family Medicine

## 2018-12-16 ENCOUNTER — Encounter: Payer: Self-pay | Admitting: Family Medicine

## 2018-12-16 VITALS — BP 128/80 | HR 72 | Temp 98.2°F | Ht 68.0 in | Wt 247.1 lb

## 2018-12-16 DIAGNOSIS — E119 Type 2 diabetes mellitus without complications: Secondary | ICD-10-CM

## 2018-12-16 LAB — MICROALBUMIN / CREATININE URINE RATIO
Creatinine,U: 76.3 mg/dL
Microalb Creat Ratio: 0.9 mg/g (ref 0.0–30.0)
Microalb, Ur: 0.7 mg/dL (ref 0.0–1.9)

## 2018-12-16 LAB — HEMOGLOBIN A1C: Hgb A1c MFr Bld: 7.4 % — ABNORMAL HIGH (ref 4.6–6.5)

## 2018-12-16 MED ORDER — GLYBURIDE 5 MG PO TABS: 5.0000 mg | ORAL_TABLET | Freq: Two times a day (BID) | ORAL | 2 refills | Status: DC

## 2018-12-16 MED ORDER — OMEPRAZOLE 20 MG PO CPDR: 20.0000 mg | DELAYED_RELEASE_CAPSULE | Freq: Every day | ORAL | 3 refills | Status: DC

## 2018-12-16 NOTE — Progress Notes (Signed)
Pre visit review using our clinic review tool, if applicable. No additional management support is needed unless otherwise documented below in the visit note. 

## 2018-12-16 NOTE — Patient Instructions (Addendum)
Consider the [facility] and [facility] for your care. They have more resources available for uninsured patients and may be better able to provide you the care you need.  [place], [place] [address] [phone]   Keep the diet clean and stay active.  Give [place] 2-3 business days to get the results of your labs back.   Let us know if you need anything.

## 2018-12-16 NOTE — Progress Notes (Signed)
Subjective:   Chief Complaint  Patient presents with  . Follow-up    Ian [name] is a 57 y.o. male here for follow-up of diabetes.   [place]'s self monitored glucose range is Patient denies hypoglycemic reactions. He checks his glucose levels 1 times per day. Patient does not require insulin.   Medications include: None currently, not doing well with  Exercise: some walking  Past Medical History:  Diagnosis Date  . Allergy   . Arthritis   . Biceps tendon rupture    bilateral  . Clostridium difficile infection 2014  . Colon polyps   . Diabetes ([place])    Borderline/Pre-diabetes  . GERD (gastroesophageal reflux disease)   . History of DVT (deep vein thrombosis)   . History of pulmonary embolism   . Hyperlipidemia   . Hypertension   . Internal bleeding hemorrhoids 2012   "might bleed once/month; only when I strain"  . Internal prolapsed hemorrhoids   . Leg cramps   . Testicular hypofunction   . Wart of face    Right cheek     Related testing: Date of retinal exam: due, no current insurance Pneumovax: done Flu Shot: yes  Review of Systems: Pulmonary:  No SOB Cardiovascular:  No chest pain  Objective:  BP 128/80 (BP Location: Left Arm, Patient Position: Sitting, Cuff Size: Normal)   Pulse 72   Temp 98.2 F (36.8 C) (Oral)   Ht 5\' 8"  (1.727 m)   Wt 247 lb 2 oz (112.1 kg)   SpO2 98%   BMI 37.58 kg/m  General:  Well developed, well nourished, in no apparent distress Eyes:  Pupils equal and round, sclera anicteric without injection  Lungs:  CTAB, no access msc use Cardio:  RRR, no bruits Psych: Age appropriate judgment and insight  Assessment:   Type 2 diabetes mellitus without complication, without long-term current use of insulin (HCC) - Plan: Hemoglobin A1c, Microalbumin / creatinine urine ratio   Plan:   Orders as above. Counseled on diet and exercise. Info for community [facility] wellness center provided. He cannot afford to go to the eye doctor at  this time.  I understand. F/u in 6 mo. The patient voiced understanding and agreement to the plan.  [place], DO 12/16/18 8:39 AM

## 2019-02-27 ENCOUNTER — Telehealth: Payer: Self-pay | Admitting: *Deleted

## 2019-02-27 NOTE — Telephone Encounter (Signed)
Received letter from [employer] [facility] for record ID [id number], stating that patient no longer qualifies for the Program's eligibility requirements at this time, they cannot provider patient with [employer]; they have notified patient of this decision as well/SLS 03/05

## 2019-03-11 ENCOUNTER — Other Ambulatory Visit: Payer: Self-pay | Admitting: Family Medicine

## 2019-03-11 MED ORDER — GLYBURIDE 5 MG PO TABS: 5.0000 mg | ORAL_TABLET | Freq: Two times a day (BID) | ORAL | 5 refills | Status: DC

## 2019-03-11 NOTE — Telephone Encounter (Signed)
Refills sent

## 2019-03-11 NOTE — Telephone Encounter (Signed)
Copied from [place] [id number]. Topic: Quick Communication - Rx Refill/Question >> Mar 11, 2019  3:01 PM Ian [name] wrote: Medication: glyBURIDE (DIABETA) 5 MG tablet - requesting 90 day supply - he said this medication is working wonderful for him  Has the patient contacted their pharmacy? Yes (Agent: If no, request that the patient contact the pharmacy for the refill.) (Agent: If yes, when and what did the pharmacy advise?)  Preferred Pharmacy (with phone number or street name): [facility] [address] [place], [place] - [address] [place] [address] Phone: [phone] Fax: [phone]    Agent: Please be advised that RX refills may take up to 3 business days. We ask that you follow-up with your pharmacy.

## 2019-03-28 ENCOUNTER — Other Ambulatory Visit: Payer: Self-pay | Admitting: Family Medicine

## 2019-03-28 DIAGNOSIS — I824Z2 Acute embolism and thrombosis of unspecified deep veins of left distal lower extremity: Secondary | ICD-10-CM

## 2019-04-14 ENCOUNTER — Other Ambulatory Visit: Payer: Self-pay | Admitting: Family Medicine

## 2019-04-27 ENCOUNTER — Other Ambulatory Visit: Payer: Self-pay | Admitting: Family Medicine

## 2019-06-16 ENCOUNTER — Encounter: Payer: Self-pay | Admitting: Family Medicine

## 2019-06-16 ENCOUNTER — Ambulatory Visit (INDEPENDENT_AMBULATORY_CARE_PROVIDER_SITE_OTHER): Payer: Self-pay | Admitting: Family Medicine

## 2019-06-16 ENCOUNTER — Other Ambulatory Visit: Payer: Self-pay

## 2019-06-16 VITALS — BP 138/88 | HR 80 | Temp 98.6°F | Ht 68.0 in | Wt 252.5 lb

## 2019-06-16 DIAGNOSIS — E781 Pure hyperglyceridemia: Secondary | ICD-10-CM

## 2019-06-16 DIAGNOSIS — E119 Type 2 diabetes mellitus without complications: Secondary | ICD-10-CM

## 2019-06-16 DIAGNOSIS — I10 Essential (primary) hypertension: Secondary | ICD-10-CM

## 2019-06-16 LAB — COMPREHENSIVE METABOLIC PANEL
ALT: 27 U/L (ref 0–53)
AST: 26 U/L (ref 0–37)
Albumin: 4 g/dL (ref 3.5–5.2)
Alkaline Phosphatase: 53 U/L (ref 39–117)
BUN: 12 mg/dL (ref 6–23)
CO2: 27 mEq/L (ref 19–32)
Calcium: 9.3 mg/dL (ref 8.4–10.5)
Chloride: 102 mEq/L (ref 96–112)
Creatinine, Ser: 1 mg/dL (ref 0.40–1.50)
GFR: 76.28 mL/min (ref >60.00)
Glucose, Bld: 73 mg/dL (ref 70–99)
Potassium: 3.4 mEq/L — ABNORMAL LOW (ref 3.5–5.1)
Sodium: 138 mEq/L (ref 135–145)
Total Bilirubin: 0.7 mg/dL (ref 0.2–1.2)
Total Protein: 6.3 g/dL (ref 6.0–8.3)

## 2019-06-16 LAB — LDL CHOLESTEROL, DIRECT: Direct LDL: 30 mg/dL

## 2019-06-16 LAB — LIPID PANEL
Cholesterol: 178 mg/dL (ref 0–200)
HDL: 23.8 mg/dL — ABNORMAL LOW (ref >39.00)
Total CHOL/HDL Ratio: 7
Triglycerides: 853 mg/dL — ABNORMAL HIGH (ref 0.0–149.0)

## 2019-06-16 LAB — HEMOGLOBIN A1C: Hgb A1c MFr Bld: 6.5 % (ref 4.6–6.5)

## 2019-06-16 MED ORDER — CYCLOBENZAPRINE HCL 10 MG PO TABS: 10.0000 mg | ORAL_TABLET | Freq: Three times a day (TID) | ORAL | 1 refills | Status: DC | PRN

## 2019-06-16 MED ORDER — LISINOPRIL 10 MG PO TABS: 10.0000 mg | ORAL_TABLET | Freq: Every day | ORAL | 1 refills | Status: DC

## 2019-06-16 NOTE — Progress Notes (Signed)
Subjective:   Chief Complaint  Patient presents with  . Follow-up    Ian [name] is a 58 y.o. male here for follow-up of diabetes.   Manraj's self monitored glucose range is 80-110's Patient denies hypoglycemic reactions. He checks his glucose levels 1 time per day. Patient does not require insulin.   Medications include: diet-controlled Diet: Fair Exercise: walking, landscaping  Hypertension Patient presents for hypertension follow up. He does not monitor home blood pressures. He is compliant with medications- HCTZ 25 mg/d. Patient has these side effects of medication: none He is adhering to a healthy diet overall. Exercise: walking, landscaping  Hyperlipidemia Patient presents for dyslipidemia follow up. Currently being treated with Crestor and fenofibrate and compliance with treatment thus far has been good. He denies myalgias. He is adhering to a healthy diet. Exercise: active w work, some walking The patient is not known to have coexisting coronary artery disease.   Past Medical History:  Diagnosis Date  . Allergy   . Arthritis   . Biceps tendon rupture    bilateral  . Clostridium difficile infection 2014  . Colon polyps   . Diabetes ([place])    Borderline/Pre-diabetes  . GERD (gastroesophageal reflux disease)   . History of DVT (deep vein thrombosis)   . History of pulmonary embolism   . Hyperlipidemia   . Hypertension   . Internal bleeding hemorrhoids 2012   "might bleed once/month; only when I strain"  . Internal prolapsed hemorrhoids   . Leg cramps   . Testicular hypofunction   . Wart of face    Right cheek     Related testing: Date of retinal exam: Due Pneumovax: done Flu Shot: done  Review of Systems: Pulmonary:  No SOB Cardiovascular:  No chest pain  Objective:  BP 138/88 (BP Location: Left Arm, Patient Position: Sitting, Cuff Size: Large)   Pulse 80   Temp 98.6 F (37 C) (Oral)   Ht 5\' 8"  (1.727 m)   Wt 252 lb 8 oz (114.5 kg)   SpO2  96%   BMI 38.39 kg/m  General:  Well developed, well nourished, in no apparent distress Skin:  Warm, no pallor or diaphoresis Head:  Normocephalic, atraumatic Eyes:  Pupils equal and round, sclera anicteric without injection  Lungs:  CTAB, no access msc use Cardio:  RRR, no bruits, no LE edema Musculoskeletal:  Symmetrical muscle groups noted without atrophy or deformity Neuro:  Sensation intact to pinprick on feet Psych: Age appropriate judgment and insight  Assessment:   Type 2 diabetes mellitus without complication, without long-term current use of insulin (HCC) - Plan: HM DIABETES FOOT EXAM  Hypertriglyceridemia - Plan: Cont statin and fibrate   Essential hypertension- Plan: Add ACEi.   Plan:   Orders as above. Counseled on diet and exercise. F/u in 3 mo. The patient voiced understanding and agreement to the plan.  [place], DO 06/16/19 10:25 AM

## 2019-06-16 NOTE — Patient Instructions (Signed)
Give us 2-3 business days to get the results of your labs back.   Keep the diet clean and stay active.  Let us know if you need anything. 

## 2019-08-26 ENCOUNTER — Other Ambulatory Visit: Payer: Self-pay | Admitting: Family Medicine

## 2019-08-26 ENCOUNTER — Encounter: Payer: Self-pay | Admitting: Family Medicine

## 2019-08-26 DIAGNOSIS — E782 Mixed hyperlipidemia: Secondary | ICD-10-CM

## 2019-08-26 MED ORDER — ROSUVASTATIN CALCIUM 20 MG PO TABS: 20.0000 mg | ORAL_TABLET | Freq: Every day | ORAL | 1 refills | Status: DC

## 2019-09-16 ENCOUNTER — Ambulatory Visit (INDEPENDENT_AMBULATORY_CARE_PROVIDER_SITE_OTHER): Payer: Self-pay | Admitting: Family Medicine

## 2019-09-16 ENCOUNTER — Encounter: Payer: Self-pay | Admitting: Family Medicine

## 2019-09-16 ENCOUNTER — Other Ambulatory Visit: Payer: Self-pay

## 2019-09-16 VITALS — BP 124/82 | HR 84 | Temp 98.3°F | Ht 68.0 in | Wt 248.0 lb

## 2019-09-16 DIAGNOSIS — E119 Type 2 diabetes mellitus without complications: Secondary | ICD-10-CM

## 2019-09-16 DIAGNOSIS — Z125 Encounter for screening for malignant neoplasm of prostate: Secondary | ICD-10-CM

## 2019-09-16 DIAGNOSIS — I10 Essential (primary) hypertension: Secondary | ICD-10-CM

## 2019-09-16 DIAGNOSIS — Z23 Encounter for immunization: Secondary | ICD-10-CM

## 2019-09-16 LAB — HEMOGLOBIN A1C: Hgb A1c MFr Bld: 6.8 % — ABNORMAL HIGH (ref 4.6–6.5)

## 2019-09-16 LAB — PSA: PSA: 0.88 ng/mL (ref 0.10–4.00)

## 2019-09-16 NOTE — Progress Notes (Signed)
Subjective:   Chief Complaint  Patient presents with  . Follow-up    3 month    Ian [name] is a 58 y.o. male here for follow-up of diabetes.   [place]'s self monitored glucose range is low 100's.   Patient denies hypoglycemic reactions. He checks his glucose levels 1 time per day. Patient does not require insulin.   Medications include: glyburide 5 mg bid Diet has been healthy Exercise: active at work with landscaping  Hypertension Patient presents for hypertension follow up. He does not monitor home blood pressures. He is compliant with medications- HCTZ 25 mg/d; had lisinopril 10 mg gave him GI adverse reactions. Patient has these side effects of medication: none He is adhering to a healthy diet overall. Exercise: active at work  Past Medical History:  Diagnosis Date  . Allergy   . Arthritis   . Biceps tendon rupture    bilateral  . Clostridium difficile infection 2014  . Colon polyps   . Diabetes ([place])    Borderline/Pre-diabetes  . GERD (gastroesophageal reflux disease)   . History of DVT (deep vein thrombosis)   . History of pulmonary embolism   . Hyperlipidemia   . Hypertension   . Internal bleeding hemorrhoids 2012   "might bleed once/month; only when I strain"  . Internal prolapsed hemorrhoids   . Leg cramps   . Testicular hypofunction   . Wart of face    Right cheek     Related testing: Date of retinal exam: due, no ins currently and cannot afford Pneumovax: done Flu Shot: receiving today  Review of Systems: Pulmonary:  No SOB Cardiovascular:  No chest pain  Objective:  BP 124/82 (BP Location: Left Arm, Patient Position: Sitting, Cuff Size: Normal)   Pulse 84   Temp 98.3 F (36.8 C) (Temporal)   Ht 5\' 8"  (1.727 m)   Wt 248 lb (112.5 kg)   SpO2 96%   BMI 37.71 kg/m  General:  Well developed, well nourished, in no apparent distress Skin:  Warm, no pallor or diaphoresis Head:  Normocephalic, atraumatic Eyes:  Pupils equal and round, sclera  anicteric without injection  Lungs:  CTAB, no access msc use Cardio:  RRR, no bruits, no LE edema Psych: Age appropriate judgment and insight  Assessment:   Type 2 diabetes mellitus without complication, without long-term current use of insulin (HCC) - Plan: Hemoglobin A1c  Essential hypertension, benign  Screening for prostate cancer - Plan: PSA  Need for influenza vaccination - Plan: Flu Vaccine QUAD 6+ mos PF IM (Fluarix Quad PF)   Plan:   Orders as above. Counseled on diet and exercise. Cannot afford to see urologist, requesting I check his PSA. OK.  F/u in 6 mo pending results. The patient voiced understanding and agreement to the plan.  [place], DO 09/16/19 9:31 AM

## 2019-09-16 NOTE — Patient Instructions (Addendum)
Give [place] 2-3 business days to get the results of your labs back.   Keep the diet clean and stay active.  Check your blood pressure every once in a while.   Let us know if you need anything.

## 2019-11-07 ENCOUNTER — Other Ambulatory Visit: Payer: Self-pay

## 2019-11-07 ENCOUNTER — Ambulatory Visit (INDEPENDENT_AMBULATORY_CARE_PROVIDER_SITE_OTHER): Payer: Self-pay | Admitting: Family Medicine

## 2019-11-07 ENCOUNTER — Encounter: Payer: Self-pay | Admitting: Family Medicine

## 2019-11-07 VITALS — BP 136/84 | HR 79 | Temp 98.0°F | Ht 68.0 in | Wt 253.1 lb

## 2019-11-07 DIAGNOSIS — L989 Disorder of the skin and subcutaneous tissue, unspecified: Secondary | ICD-10-CM

## 2019-11-07 NOTE — Progress Notes (Signed)
Chief Complaint  Patient presents with  . discoloration of right foot  . has growth on right shoulder to remove    ROMARIO CENTRELLA is a 58 y.o. male here for a skin complaint.  Duration: several months Location: R shoulder Pruritic? No Painful? Yes Drainage? No New soaps/lotions/topicals/detergents? No Sick contacts? No Other associated symptoms: none Therapies tried thus far: none  ROS:  Const: No fevers Skin: As noted in HPI  Past Medical History:  Diagnosis Date  . Allergy   . Arthritis   . Biceps tendon rupture    bilateral  . Clostridium difficile infection 2014  . Colon polyps   . Diabetes ([place])    Borderline/Pre-diabetes  . GERD (gastroesophageal reflux disease)   . History of DVT (deep vein thrombosis)   . History of pulmonary embolism   . Hyperlipidemia   . Hypertension   . Internal bleeding hemorrhoids 2012   "might bleed once/month; only when I strain"  . Internal prolapsed hemorrhoids   . Leg cramps   . Testicular hypofunction   . Wart of face    Right cheek    BP 136/84 (BP Location: Left Arm, Patient Position: Sitting, Cuff Size: Large)   Pulse 79   Temp 98 F (36.7 C) (Temporal)   Ht 5\' 8"  (1.727 m)   Wt 253 lb 2 oz (114.8 kg)   SpO2 98%   BMI 38.49 kg/m  Gen: awake, alert, appearing stated age Lungs: No accessory muscle use Skin: Raised and pedunculated lesion around neck. No drainage, erythema, TTP, fluctuance, excoriation Psych: Age appropriate judgment and insight  Procedure note; destruction of benign lesion Informed consent was obtained. The area was cleaned with alcohol and injected with 1 mL of 1% lidocaine with epinephrine. A Dermablade was slightly bent and used to cut under the area of interest. The area was then cauterized ensuring adequate hemostasis. The area was dressed with triple antibiotic ointment and a bandage. There were no complications noted. The patient tolerated the procedure well.  Skin lesion - Plan: PR  DESTRUCTION BENIGN LESIONS UP TO 14  Aftercare instructions verbalized and written down. Warning signs and symptoms verbalized and written down in AVS.  This should do well.  F/u prn. The patient voiced understanding and agreement to the plan.  [place], DO 11/07/19 4:26 PM

## 2019-11-07 NOTE — Patient Instructions (Signed)
Do not shower for the rest of the day. When you do wash it, use only soap and water. Do not vigorously scrub. Apply triple antibiotic ointment (like Neosporin) twice daily. Keep the area clean and dry.   Things to look out for: increasing pain not relieved by ibuprofen/acetaminophen, fevers, spreading redness, drainage of pus, or foul odor.  Knee Exercises It is normal to feel mild stretching, pulling, tightness, or discomfort as you do these exercises, but you should stop right away if you feel sudden pain or your pain gets worse. STRETCHING AND RANGE OF MOTION EXERCISES  These exercises warm up your muscles and joints and improve the movement and flexibility of your knee. These exercises also help to relieve pain, numbness, and tingling. Exercise A: Knee Extension, Prone  1. Lie on your abdomen on a bed. 2. Place your left / right knee just beyond the edge of the surface so your knee is not on the bed. You can put a towel under your left / right thigh just above your knee for comfort. 3. Relax your leg muscles and allow gravity to straighten your knee. You should feel a stretch behind your left / right knee. 4. Hold this position for 30 seconds. 5. Scoot up so your knee is supported between repetitions. Repeat 2 times. Complete this stretch 3 times per week. Exercise B: Knee Flexion, Active    1. Lie on your back with both knees straight. If this causes back discomfort, bend your left / right knee so your foot is flat on the floor. 2. Slowly slide your left / right heel back toward your buttocks until you feel a gentle stretch in the front of your knee or thigh. 3. Hold this position for 30 seconds. 4. Slowly slide your left / right heel back to the starting position. Repeat 2 times. Complete this exercise 3 times per week. Exercise C: Quadriceps, Prone    1. Lie on your abdomen on a firm surface, such as a bed or padded floor. 2. Bend your left / right knee and hold your ankle. If  you cannot reach your ankle or pant leg, loop a belt around your foot and grab the belt instead. 3. Gently pull your heel toward your buttocks. Your knee should not slide out to the side. You should feel a stretch in the front of your thigh and knee. 4. Hold this position for 30 seconds. Repeat 2 times. Complete this stretch 3 times per week. Exercise D: Hamstring, Supine  1. Lie on your back. 2. Loop a belt or towel over the ball of your left / right foot. The ball of your foot is on the walking surface, right under your toes. 3. Straighten your left / right knee and slowly pull on the belt to raise your leg until you feel a gentle stretch behind your knee. ? Do not let your left / right knee bend while you do this. ? Keep your other leg flat on the floor. 4. Hold this position for 30 seconds. Repeat 2 times. Complete this stretch 3 times per week. STRENGTHENING EXERCISES  These exercises build strength and endurance in your knee. Endurance is the ability to use your muscles for a long time, even after they get tired. Exercise E: Quadriceps, Isometric    1. Lie on your back with your left / right leg extended and your other knee bent. Put a rolled towel or small pillow under your knee if told by your health care provider.  2. Slowly tense the muscles in the front of your left / right thigh. You should see your kneecap slide up toward your hip or see increased dimpling just above the knee. This motion will push the back of the knee toward the floor. 3. For 3 seconds, keep the muscle as tight as you can without increasing your pain. 4. Relax the muscles slowly and completely. Repeat for 10 total reps Repeat 2 ti mes. Complete this exercise 3 times per week. Exercise F: Straight Leg Raises - Quadriceps  1. Lie on your back with your left / right leg extended and your other knee bent. 2. Tense the muscles in the front of your left / right thigh. You should see your kneecap slide up or see  increased dimpling just above the knee. Your thigh may even shake a bit. 3. Keep these muscles tight as you raise your leg 4-6 inches (10-15 cm) off the floor. Do not let your knee bend. 4. Hold this position for 3 seconds. 5. Keep these muscles tense as you lower your leg. 6. Relax your muscles slowly and completely after each repetition. 10 total reps. Repeat 2 times. Complete this exercise 3 times per week.  Exercise G: Hamstring Curls    If told by your health care provider, do this exercise while wearing ankle weights. Begin with 5 lb weights (optional). Then increase the weight by 1 lb (0.5 kg) increments. Do not wear ankle weights that are more than 20 lbs to start with. 1. Lie on your abdomen with your legs straight. 2. Bend your left / right knee as far as you can without feeling pain. Keep your hips flat against the floor. 3. Hold this position for 3 seconds. 4. Slowly lower your leg to the starting position. Repeat for 10 reps.  Repeat 2 times. Complete this exercise 3 times per week. Exercise H: Squats (Quadriceps)  1. Stand in front of a table, with your feet and knees pointing straight ahead. You may rest your hands on the table for balance but not for support. 2. Slowly bend your knees and lower your hips like you are going to sit in a chair. ? Keep your weight over your heels, not over your toes. ? Keep your lower legs upright so they are parallel with the table legs. ? Do not let your hips go lower than your knees. ? Do not bend lower than told by your health care provider. ? If your knee pain increases, do not bend as low. 3. Hold the squat position for 1 second. 4. Slowly push with your legs to return to standing. Do not use your hands to pull yourself to standing. Repeat 2 times. Complete this exercise 3 times per week. Exercise I: Wall Slides (Quadriceps)    1. Lean your back against a smooth wall or door while you walk your feet out 18-24 inches (46-61 cm) from  it. 2. Place your feet hip-width apart. 3. Slowly slide down the wall or door until your knees Repeat 2 times. Complete this exercise every other day. 4. Exercise K: Straight Leg Raises - Hip Abductors  1. Lie on your side with your left / right leg in the top position. Lie so your head, shoulder, knee, and hip line up. You may bend your bottom knee to help you keep your balance. 2. Roll your hips slightly forward so your hips are stacked directly over each other and your left / right knee is facing forward. 3. Leading  with your heel, lift your top leg 4-6 inches (10-15 cm). You should feel the muscles in your outer hip lifting. ? Do not let your foot drift forward. ? Do not let your knee roll toward the ceiling. 4. Hold this position for 3 seconds. 5. Slowly return your leg to the starting position. 6. Let your muscles relax completely after each repetition. 10 total reps. Repeat 2 times. Complete this exercise 3 times per week. Exercise J: Straight Leg Raises - Hip Extensors  1. Lie on your abdomen on a firm surface. You can put a pillow under your hips if that is more comfortable. 2. Tense the muscles in your buttocks and lift your left / right leg about 4-6 inches (10-15 cm). Keep your knee straight as you lift your leg. 3. Hold this position for 3 seconds. 4. Slowly lower your leg to the starting position. 5. Let your leg relax completely after each repetition. Repeat 2 times. Complete this exercise 3 times per week. Document Released: 10/25/2005 Document Revised: 09/04/2016 Document Reviewed: 10/17/2015 Elsevier Interactive Patient Education  2017 [employer].

## 2019-11-18 ENCOUNTER — Telehealth: Payer: Self-pay | Admitting: Family Medicine

## 2019-11-18 NOTE — Telephone Encounter (Signed)
Pt had a cold and took over the counter meds for it and now he doesn't have the cold symptoms but has been having stomach issues with diarrhea / Pt would like to know if he should schedule an appt or if Dr. Nani Ravens can suggest something for relief / please advise

## 2019-11-18 NOTE — Telephone Encounter (Signed)
Called and scheduled appt 11/19/2019

## 2019-11-19 ENCOUNTER — Encounter: Payer: Self-pay | Admitting: Family Medicine

## 2019-11-19 ENCOUNTER — Other Ambulatory Visit: Payer: Self-pay

## 2019-11-19 ENCOUNTER — Ambulatory Visit (INDEPENDENT_AMBULATORY_CARE_PROVIDER_SITE_OTHER): Payer: Self-pay | Admitting: Family Medicine

## 2019-11-19 DIAGNOSIS — R197 Diarrhea, unspecified: Secondary | ICD-10-CM

## 2019-11-19 DIAGNOSIS — J069 Acute upper respiratory infection, unspecified: Secondary | ICD-10-CM

## 2019-11-19 MED ORDER — PREDNISONE 20 MG PO TABS: 20.0000 mg | ORAL_TABLET | Freq: Two times a day (BID) | ORAL | 0 refills | Status: AC

## 2019-11-19 NOTE — Progress Notes (Addendum)
Chief Complaint  Patient presents with  . Diarrhea    had a cold this past weekend    [name] here for URI complaints. Due to COVID-19 pandemic, we are interacting via telephone. I verified patient's ID using 2 identifiers. Patient agreed to proceed with visit via this method. Patient is at home, I am at office. Patient and I are present for visit.   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   ROS:  Const: Denies fevers HEENT: As noted in HPI Lungs: No SOB  Past Medical History:  Diagnosis Date  . Allergy   . Arthritis   . Biceps tendon rupture    bilateral  . Clostridium difficile infection 2014  . Colon polyps   . Diabetes ([place])    Borderline/Pre-diabetes  . GERD (gastroesophageal reflux disease)   . History of DVT (deep vein thrombosis)   . History of pulmonary embolism   . Hyperlipidemia   . Hypertension   . Internal bleeding hemorrhoids 2012   "might bleed once/month; only when I strain"  . Internal prolapsed hemorrhoids   . Leg cramps   . Testicular hypofunction   . Wart of face    Right cheek   Exam No conversational dyspnea Age appropriate judgment and insight Nml affect and mood  Diarrhea, unspecified type  Upper respiratory tract infection, unspecified type  Pred burst for possible allergies. If no improvement, will test on Mon.  Continue to push fluids, practice good hand hygiene, cover mouth when coughing. Total time spent: 12 min F/u prn. If starting to experience fevers, shaking, or shortness of breath, seek immediate care. Pt voiced understanding and agreement to the plan.  [place], DO 11/19/19 3:44  PM

## 2019-11-22 ENCOUNTER — Encounter: Payer: Self-pay | Admitting: Family Medicine

## 2019-11-26 ENCOUNTER — Telehealth: Payer: Self-pay | Admitting: Family Medicine

## 2019-11-26 NOTE — Telephone Encounter (Signed)
Pt dropped off document to be filled out by provider. 1 page Assistant Program. Pt would like to be called when document ready to pick up Tel [phone]. Document put at front office tray under providers name.

## 2019-12-01 NOTE — Telephone Encounter (Signed)
Called informed form completed and can pickup at the front desk.

## 2019-12-13 ENCOUNTER — Encounter: Payer: Self-pay | Admitting: Family Medicine

## 2019-12-15 ENCOUNTER — Telehealth: Payer: Self-pay | Admitting: Family Medicine

## 2019-12-15 NOTE — Telephone Encounter (Signed)
Copied from [place] [id number]. Topic: General - Other >> Dec 15, 2019 12:06 PM Leward Quan A wrote: Reason for CRM: Patient called to inform Dr Nani Ravens that his wife tested positive for covid and he want to get feedback from Dr Nani Ravens on whether he should get tested or not. Patient can be reached at Ph# [phone]   See my chart message dated 12/15/2019 regarding same message. PCP has responded

## 2019-12-22 ENCOUNTER — Telehealth: Payer: Self-pay

## 2019-12-22 NOTE — Telephone Encounter (Signed)
Patient informed. 

## 2019-12-22 NOTE — Telephone Encounter (Signed)
Copied from [place] [id number]. Topic: General - Other >> Dec 22, 2019  8:27 AM Leward Quan A wrote: Reason for CRM: Patient called to inquire of Dr Nani Ravens since his wife tested positive for covid and was in the hospital even though he qurantined for over two weeks and have no symptoms should he go in and get tested now. Patient is asking for a call back at Ph# [phone] or [phone]

## 2019-12-22 NOTE — Telephone Encounter (Signed)
He can stop if he has quarantined for 2 weeks without onset of symptoms. Ty.

## 2019-12-22 NOTE — Telephone Encounter (Signed)
Patient informed of response 

## 2019-12-22 NOTE — Telephone Encounter (Signed)
His quarantine ended on Saturday for 2 weeks. When is he ok to stop or should he continue

## 2019-12-22 NOTE — Telephone Encounter (Signed)
No

## 2019-12-31 ENCOUNTER — Other Ambulatory Visit: Payer: Self-pay | Admitting: Family Medicine

## 2019-12-31 ENCOUNTER — Encounter: Payer: Self-pay | Admitting: Family Medicine

## 2019-12-31 DIAGNOSIS — I824Z2 Acute embolism and thrombosis of unspecified deep veins of left distal lower extremity: Secondary | ICD-10-CM

## 2019-12-31 MED ORDER — RIVAROXABAN 20 MG PO TABS
ORAL_TABLET | ORAL | 1 refills | Status: DC
Start: 2019-12-31 — End: 2020-01-01

## 2019-12-31 NOTE — Telephone Encounter (Signed)
Requested medication (s) are due for refill today: yes  Requested medication (s) are on the active medication list: yes  Last refill:  04/20/19  Future visit scheduled: yes  Notes to clinic:  labs out of date    Requested Prescriptions  Pending Prescriptions Disp Refills   rivaroxaban (XARELTO) 20 MG TABS tablet 90 tablet 1      Hematology: Anticoagulants - rivaroxaban Failed - 12/31/2019  4:27 PM      Failed - ALT in normal range and within 180 days    ALT  Date Value Ref Range Status  06/16/2019 27 0 - 53 U/L Final  04/29/2018 23 0 - 55 U/L Final  04/30/2017 28 0 - 55 U/L Final   ALT(SGPT)  Date Value Ref Range Status  10/29/2017 29 10 - 47 U/L Final          Failed - AST in normal range and within 180 days    AST  Date Value Ref Range Status  06/16/2019 26 0 - 37 U/L Final  04/29/2018 25 5 - 34 U/L Final  04/30/2017 26 5 - 34 U/L Final          Failed - HCT in normal range and within 360 days    HCT  Date Value Ref Range Status  04/29/2018 49.0 38.7 - 49.9 % Final  10/29/2017 46.9 38.7 - 49.9 % Final          Failed - HGB in normal range and within 360 days    Hemoglobin  Date Value Ref Range Status  04/29/2018 17.3 (H) 13.0 - 17.1 g/dL Final   HGB  Date Value Ref Range Status  10/29/2017 16.7 13.0 - 17.1 g/dL Final          Failed - PLT in normal range and within 360 days    Platelets  Date Value Ref Range Status  10/29/2017 125 (L) 145 - 400 10e3/uL Final   Platelet Count  Date Value Ref Range Status  04/29/2018 114 (L) 145 - 400 K/uL Final          Passed - Cr in normal range and within 360 days    Creatinine  Date Value Ref Range Status  04/29/2018 1.09 0.70 - 1.30 mg/dL Final  04/30/2017 1.1 0.7 - 1.3 mg/dL Final   Creat  Date Value Ref Range Status  10/29/2017 1.3 (H) 0.6 - 1.2 mg/dl Final   Creatinine, Ser  Date Value Ref Range Status  06/16/2019 1.00 0.40 - 1.50 mg/dL Final   Creatinine,U  Date Value Ref Range Status   12/16/2018 76.3 mg/dL Final   Creatinine, Urine  Date Value Ref Range Status  07/29/2014 160.0 mg/dL Final          Passed - Valid encounter within last 12 months    Recent Outpatient Visits           1 month ago Diarrhea, unspecified type   [facility], [place], DO   1 month ago Skin lesion   [facility], [place], [place]   3 months ago Type 2 diabetes mellitus without complication, without long-term current use of insulin ([place])   [facility], [employer], DO   6 months ago Type 2 diabetes mellitus without complication, without long-term current use of insulin ([place])   [facility], [place], [place]  1 year ago Type 2 diabetes mellitus without complication, without long-term current use of insulin ([place])   [facility], [place], [place]       Future Appointments             In 2 months [place], [place], [place] at [facility], [place]

## 2019-12-31 NOTE — Telephone Encounter (Signed)
XARELTO 20 MG TABS tablet  Pt sates that he has been calling on this script, do not see notes but that rx has reached out a couple time, pt has had appt Nov and set for another in March, if not refilling pls contact pt also make sure this goes to  [id number] - [place], [place] - [address] AT [place] Phone:  [phone]  Fax:  [phone]

## 2020-01-01 MED ORDER — RIVAROXABAN 20 MG PO TABS: ORAL_TABLET | ORAL | 1 refills | Status: DC

## 2020-01-27 ENCOUNTER — Telehealth: Payer: Self-pay | Admitting: Family Medicine

## 2020-01-27 MED ORDER — HYDROCHLOROTHIAZIDE 25 MG PO TABS: 25.0000 mg | ORAL_TABLET | Freq: Every day | ORAL | 1 refills | Status: DC

## 2020-01-27 NOTE — Telephone Encounter (Signed)
Medication: hydrochlorothiazide (HYDRODIURIL) 25 MG tablet [id number]    Has the patient contacted their pharmacy? Yes.    New Pharmacy : [facility]  [place]  [place]  [id number] [phone]   Patient transferred pharmacies and needs approval to get meds.

## 2020-01-27 NOTE — Telephone Encounter (Signed)
Refill done.  

## 2020-02-24 ENCOUNTER — Telehealth: Payer: Self-pay | Admitting: Family Medicine

## 2020-02-24 NOTE — Telephone Encounter (Signed)
Likely needs an appt to discuss, but what have his sugars been running at?

## 2020-02-24 NOTE — Telephone Encounter (Signed)
Patient states that his diabetic medication is not working for him. glyBURIDE  ([place]) 5 MG tablet [id number]   Patient states that blood sugar reading range from 150-250. Please advise   In morning before eating blood sugar is 150. After eating rises to over 200

## 2020-02-24 NOTE — Telephone Encounter (Signed)
Pt stated that the glyburide is not working.  Tried to call pt but line rang and the went quiet.   Any recommendation for patient before I attempt to get in touch with him?    RE: wanted to confirm that pt is taking the glyburide bid.

## 2020-02-25 NOTE — Telephone Encounter (Signed)
Called and rescheduled appt for this Friday at 2:15

## 2020-02-26 ENCOUNTER — Other Ambulatory Visit: Payer: Self-pay

## 2020-02-27 ENCOUNTER — Ambulatory Visit (INDEPENDENT_AMBULATORY_CARE_PROVIDER_SITE_OTHER): Payer: Self-pay | Admitting: Family Medicine

## 2020-02-27 ENCOUNTER — Encounter: Payer: Self-pay | Admitting: Family Medicine

## 2020-02-27 VITALS — BP 132/82 | HR 94 | Temp 98.1°F | Ht 68.0 in | Wt 254.2 lb

## 2020-02-27 DIAGNOSIS — E1165 Type 2 diabetes mellitus with hyperglycemia: Secondary | ICD-10-CM

## 2020-02-27 DIAGNOSIS — E1169 Type 2 diabetes mellitus with other specified complication: Secondary | ICD-10-CM | POA: Insufficient documentation

## 2020-02-27 DIAGNOSIS — E669 Obesity, unspecified: Secondary | ICD-10-CM | POA: Insufficient documentation

## 2020-02-27 MED ORDER — PIOGLITAZONE HCL 30 MG PO TABS: 30.0000 mg | ORAL_TABLET | Freq: Every day | ORAL | 2 refills | Status: DC

## 2020-02-27 NOTE — Patient Instructions (Signed)
Give us 2-3 business days to get the results of your labs back.  ? ?Keep the diet clean and stay active. ? ?Keep checking your sugars.  ? ?Let us know if you need anything. ?

## 2020-02-27 NOTE — Progress Notes (Signed)
Chief Complaint  Patient presents with  . Hyperglycemia    Subjective: Patient is a 59 y.o. male here for elevated blood sugar.  He is here with his wife who helps provide the history.  Over the past several weeks since going on prednisone for suspected Covid-19, the patient has been having increasing sugars.  Normally they are in the low 100s.  Recently they have been into the mid to high 100s and sometimes even in the low 200s.  He continues to take glyburide 5 mg twice daily without hypoglycemia or adverse effects.  Diet has been unchanged in addition to his physical activity level.  He is not on insulin.  Denies fevers or recent illness, diarrhea, or increased urination.   ROS:  Endo: As noted in HPI Constitutional: No fevers  Past Medical History:  Diagnosis Date  . Allergy   . Arthritis   . Biceps tendon rupture    bilateral  . Clostridium difficile infection 2014  . Colon polyps   . Diabetes ([place])    Borderline/Pre-diabetes  . GERD (gastroesophageal reflux disease)   . History of DVT (deep vein thrombosis)   . History of pulmonary embolism   . Hyperlipidemia   . Hypertension   . Internal bleeding hemorrhoids 2012   "might bleed once/month; only when I strain"  . Internal prolapsed hemorrhoids   . Leg cramps   . Testicular hypofunction   . Wart of face    Right cheek    Objective: BP 132/82 (BP Location: Left Arm, Patient Position: Sitting, Cuff Size: Normal)   Pulse 94   Temp 98.1 F (36.7 C) (Temporal)   Ht 5\' 8"  (1.727 m)   Wt 254 lb 4 oz (115.3 kg)   SpO2 94%   BMI 38.66 kg/m  General: Awake, appears stated age Heart: RRR, 1+ lower extremity edema bilaterally tapering at the mid tibia Lungs: CTAB, no rales, wheezes or rhonchi. No accessory muscle use Psych: Age appropriate judgment and insight, normal affect and mood  Assessment and Plan: Type 2 diabetes mellitus with hyperglycemia, without long-term current use of insulin ([place]) - Plan: pioglitazone  (ACTOS) 30 MG tablet, Microalbumin / creatinine urine ratio, CANCELED: Microalbumin / creatinine urine ratio  Orders as above. Pt is self pay. Had done well w SGLT-2 inhibitors in past, cost is a current barrier. Wonder if his pancreatic function slightly decreased.  He askeed about supplements. Discussed Cinnamon and ginger might be helpful, but weak data.  F/u in 1 mo. The patient and his wife voiced understanding and agreement to the plan.  [place], DO 02/27/20  3:01 PM

## 2020-02-28 LAB — MICROALBUMIN / CREATININE URINE RATIO
Creatinine, Urine: 148 mg/dL (ref 20–320)
Microalb Creat Ratio: 2 mcg/mg creat (ref <30)
Microalb, Ur: 0.3 mg/dL

## 2020-03-01 ENCOUNTER — Ambulatory Visit: Payer: Self-pay | Admitting: Family Medicine

## 2020-03-13 ENCOUNTER — Other Ambulatory Visit: Payer: Self-pay

## 2020-03-13 ENCOUNTER — Emergency Department (HOSPITAL_BASED_OUTPATIENT_CLINIC_OR_DEPARTMENT_OTHER): Payer: Self-pay

## 2020-03-13 ENCOUNTER — Emergency Department (HOSPITAL_BASED_OUTPATIENT_CLINIC_OR_DEPARTMENT_OTHER)
Admission: EM | Admit: 2020-03-13 | Discharge: 2020-03-13 | Disposition: A | Discharge status: Home / Self Care | Payer: Self-pay | Attending: Emergency Medicine | Admitting: Emergency Medicine

## 2020-03-13 ENCOUNTER — Encounter (HOSPITAL_BASED_OUTPATIENT_CLINIC_OR_DEPARTMENT_OTHER): Payer: Self-pay | Admitting: Emergency Medicine

## 2020-03-13 DIAGNOSIS — Z7984 Long term (current) use of oral hypoglycemic drugs: Secondary | ICD-10-CM | POA: Insufficient documentation

## 2020-03-13 DIAGNOSIS — I10 Essential (primary) hypertension: Secondary | ICD-10-CM | POA: Insufficient documentation

## 2020-03-13 DIAGNOSIS — E119 Type 2 diabetes mellitus without complications: Secondary | ICD-10-CM | POA: Insufficient documentation

## 2020-03-13 DIAGNOSIS — M25562 Pain in left knee: Secondary | ICD-10-CM | POA: Insufficient documentation

## 2020-03-13 DIAGNOSIS — W1842XA Slipping, tripping and stumbling without falling due to stepping into hole or opening, initial encounter: Secondary | ICD-10-CM | POA: Insufficient documentation

## 2020-03-13 DIAGNOSIS — Z7901 Long term (current) use of anticoagulants: Secondary | ICD-10-CM | POA: Insufficient documentation

## 2020-03-13 DIAGNOSIS — W19XXXA Unspecified fall, initial encounter: Secondary | ICD-10-CM

## 2020-03-13 MED ORDER — HYDROCODONE-ACETAMINOPHEN 7.5-325 MG PO TABS
1.0000 | ORAL_TABLET | Freq: Once | ORAL | Status: DC
  Filled 2020-03-13: qty 1

## 2020-03-13 MED ORDER — ONDANSETRON 4 MG PO TBDP
4.0000 mg | ORAL_TABLET | Freq: Once | ORAL | Status: AC
  Administered 2020-03-13: 4 mg via ORAL
  Filled 2020-03-13: qty 1

## 2020-03-13 MED ORDER — ONDANSETRON HCL 4 MG/2ML IJ SOLN: 4.0000 mg | Freq: Once | INTRAMUSCULAR | Status: DC

## 2020-03-13 MED ORDER — ONDANSETRON HCL 4 MG PO TABS
4.0000 mg | ORAL_TABLET | Freq: Once | ORAL | Status: DC
  Filled 2020-03-13: qty 1

## 2020-03-13 MED ORDER — HYDROCODONE-ACETAMINOPHEN 5-325 MG PO TABS: 1.0000 | ORAL_TABLET | Freq: Three times a day (TID) | ORAL | 0 refills | Status: DC | PRN

## 2020-03-13 MED ORDER — HYDROCODONE-ACETAMINOPHEN 5-325 MG PO TABS
1.0000 | ORAL_TABLET | Freq: Once | ORAL | Status: AC
  Administered 2020-03-13: 11:00:00 1 via ORAL
  Filled 2020-03-13: qty 1

## 2020-03-13 NOTE — ED Provider Notes (Signed)
[facility] EMERGENCY DEPARTMENT Provider Note   CSN: [id number] Arrival date & time: 03/13/20  1012     History Chief Complaint  Patient presents with  . Fall    Ian [name] is a 59 y.o. male.  HPI   Pt is a 59 y/o male who presents to the ED today for eval after a fall. He states he was doing some landscaping and stepped into a hole. States he twisted his left knee and fell to the ground. He experienced sudden onset left knee pain that has been constant since onset. Pain is exacerbated with weight bearing. Denies and head trauma or LOC. Denies numbness to the LLE. Denies any other injuries.   Past Medical History:  Diagnosis Date  . Allergy   . Arthritis   . Biceps tendon rupture    bilateral  . Clostridium difficile infection 2014  . Colon polyps   . Diabetes ([place])    Borderline/Pre-diabetes  . GERD (gastroesophageal reflux disease)   . History of DVT (deep vein thrombosis)   . History of pulmonary embolism   . Hyperlipidemia   . Hypertension   . Internal bleeding hemorrhoids 2012   "might bleed once/month; only when I strain"  . Internal prolapsed hemorrhoids   . Leg cramps   . Testicular hypofunction   . Wart of face    Right cheek    Patient Active Problem List   Diagnosis Date Noted  . Type 2 diabetes mellitus with hyperglycemia, without long-term current use of insulin ([place]) 02/27/2020  . Complete rotator cuff tear 12/13/2017  . Claudication ([place]) 09/13/2017  . Allergy to pollen 11/06/2016  . Pulmonary embolism, bilateral ([place])   . Lower leg DVT (deep venous thromboembolism), acute ([place])   . Spinal stenosis of lumbar region at multiple levels 01/20/2015  . Screening, ischemic heart disease 01/03/2015  . Hypertriglyceridemia 08/17/2014  . Obesity 08/17/2014  . Type 2 diabetes mellitus without complication, without long-term current use of insulin ([place]) 07/29/2014  . Hx of adenomatous colonic polyps 06/03/2014  . Essential hypertension,  benign 09/13/2013  . Thrombocytopenia ([place]) 09/13/2013  . HTN (hypertension) 05/13/2013  . Hyperlipidemia   . Testicular hypofunction   . Biceps tendon rupture   . GERD (gastroesophageal reflux disease)   . Colon polyps     Past Surgical History:  Procedure Laterality Date  . ANAL FISSURE REPAIR  06/24/2013  . APPENDECTOMY  2011  . COLONOSCOPY W/ BIOPSIES AND POLYPECTOMY  2012  . COLONSCOPY  2015  . EXCISIONAL HEMORRHOIDECTOMY    . FISSURECTOMY    . HEMORRHOID SURGERY  06/24/2013  . internal sphincterotomy  06-24-2013  . LUMBAR LAMINECTOMY/DECOMPRESSION MICRODISCECTOMY N/A 01/20/2015   Procedure: MICRO LUMBAR DECOMPRESSION L4-5/L3-4/L2-3;  Surgeon: Johnn Hai, MD;  Location: [facility];  Service: Orthopedics;  Laterality: N/A;  . SHOULDER ARTHROSCOPY  08/22/2012   Procedure: ARTHROSCOPY SHOULDER;  Surgeon: Marin Shutter, MD;  Location: [facility];  Service: Orthopedics;  Laterality: Left;  Left shoulder arthroscopic lavage and debridement  . SHOULDER ARTHROSCOPY  09/02/2012   Procedure: ARTHROSCOPY SHOULDER;  Surgeon: Marin Shutter, MD;  Location: [facility];  Service: Orthopedics;  Laterality: Left;  Left shoulder arthroscopy irrigation and debridement  . SHOULDER ARTHROSCOPY W/ ROTATOR CUFF REPAIR  07/09/2012   left  . SHOULDER OPEN ROTATOR CUFF REPAIR Right 12/13/2017   Procedure: Right shoulder mini open rotator cuff repair;  Surgeon: Susa Day, MD;  Location: [facility];  Service: Orthopedics;  Laterality: Right;  Interscalene Block  . SKIN LESION EXCISION     Benign  . WISDOM TOOTH EXTRACTION  YRS AGO       Family History  Problem Relation Age of Onset  . Heart disease Mother 39       Deceased  . Hyperlipidemia Mother   . Hypertension Mother   . Heart disease Father 16       Deceased  . CVA Father   . Hypertension Maternal Grandmother   . Hyperlipidemia Maternal Grandmother   . Cancer Maternal Grandmother        Colon Cancer  . Heart disease Maternal Grandfather   .  Hypertension Maternal Grandfather   . Hyperlipidemia Maternal Grandfather   . Cancer Maternal Grandfather        Lung Cancer  . Heart attack Maternal Grandfather   . Heart disease Paternal Grandfather     Social History   Tobacco Use  . Smoking status: Never Smoker  . Smokeless tobacco: Never Used  . Tobacco comment: NEVER USED TOBACCO  Substance Use Topics  . Alcohol use: No    Alcohol/week: 0.0 standard drinks  . Drug use: No    Home Medications Prior to Admission medications   Medication Sig Start Date End Date Taking? Authorizing Provider  Blood Glucose Monitoring Suppl (RELION CONFIRM GLUCOSE MONITOR) W/DEVICE KIT USE AS DIRECTED TO TEST BLOOD GLUCOSE DAILY DX: 250.00 08/13/14   Brunetta Jeans, PA-C  Cholecalciferol (VITAMIN D-3) 5000 UNITS TABS Take 5,000 Units by mouth daily.     [provider]  EPIPEN 2-PAK 0.3 MG/0.3ML SOAJ injection Inject 0.3 mg into the skin once.  04/19/15   [provider]  fenofibrate (TRICOR) 145 MG tablet TAKE ONE TABLET BY MOUTH DAILY 04/14/19   Shelda Pal, DO  glyBURIDE (DIABETA) 5 MG tablet TAKE ONE TABLET BY MOUTH TWICE A DAY WITH MEALS 08/26/19   [place], [place], DO  hydrochlorothiazide (HYDRODIURIL) 25 MG tablet Take 1 tablet (25 mg total) by mouth daily. 01/27/20   Shelda Pal, DO  HYDROcodone-acetaminophen (NORCO/VICODIN) 5-325 MG tablet Take 1 tablet by mouth every 8 (eight) hours as needed. 03/13/20   Novella Abraha S, PA-C  magnesium 30 MG tablet Take 800 mg by mouth daily.    [provider]  omeprazole (PRILOSEC) 20 MG capsule Take 1 capsule (20 mg total) by mouth daily. 12/16/18   Shelda Pal, DO  pioglitazone (ACTOS) 30 MG tablet Take 1 tablet (30 mg total) by mouth daily. 02/27/20   Shelda Pal, DO  potassium gluconate (RA POTASSIUM GLUCONATE) 595 (99 K) MG TABS tablet Take 2.5 mEq by mouth.    [provider]  rivaroxaban (XARELTO) 20 MG TABS  tablet TAKE 1 TABLET (20 MG TOTAL) BY MOUTH DAILY WITH SUPPER. 01/01/20   [place], [place], DO  rosuvastatin (CRESTOR) 20 MG tablet Take 1 tablet (20 mg total) by mouth daily. 08/26/19   Shelda Pal, DO    Allergies    Penicillins; Tetanus toxoid, adsorbed; Tetanus toxoids; Aspirin; Daptomycin; Prednisone; Bioflavonoids; Other; Tetanus immune globulin; Citrus; Fenofibrate; Milk-related compounds; and Oxycodone  Review of Systems   Review of Systems  Musculoskeletal:       Left knee pain  Skin: Negative for wound.  Neurological: Negative for weakness and numbness.       No head trauma or LOC    Physical Exam Updated Vital Signs BP (!) 148/96 (BP Location: Left Arm)   Pulse 89   Temp 98.2  F (36.8 C) (Oral)   Resp 20   Ht 5' 8"  (1.727 m)   Wt 112.5 kg   SpO2 99%   BMI 37.71 kg/m   Physical Exam Constitutional:      General: He is not in acute distress.    Appearance: He is well-developed.  Eyes:     Conjunctiva/sclera: Conjunctivae normal.  Cardiovascular:     Rate and Rhythm: Normal rate and regular rhythm.  Pulmonary:     Effort: Pulmonary effort is normal.     Breath sounds: Normal breath sounds.  Skin:    General: Skin is warm and dry.  Neurological:     Mental Status: He is alert and oriented to person, place, and time.     ED Results / Procedures / Treatments   Labs (all labs ordered are listed, but only abnormal results are displayed) Labs Reviewed - No data to display  EKG None  Radiology DG Knee Complete 4 Views Left  Result Date: 03/13/2020 CLINICAL DATA:  Fall. Knee pain. Stepped in a hole and twisted the LATERAL falling backwards. EXAM: LEFT KNEE - COMPLETE 4+ VIEW COMPARISON:  None FINDINGS: Joint effusion is present. There are degenerative changes of the patellofemoral compartment. No acute fracture or subluxation. IMPRESSION: Joint effusion. Electronically Signed   By: Nolon Nations M.D.   On: 03/13/2020 10:59     Procedures Procedures (including critical care time)  SPLINT APPLICATION Date/Time: 93:90 AM Authorized by: Rodney Booze Consent: Verbal consent obtained. Risks and benefits: risks, benefits and alternatives were discussed Consent given by: patient Splint applied by: technician Location details: LLE Splint type: knee immobilizer Supplies used: knee immobilizer Post-procedure: The splinted body part was neurovascularly unchanged following the procedure. Patient tolerance: Patient tolerated the procedure well with no immediate complications.     Medications Ordered in ED Medications  HYDROcodone-acetaminophen (NORCO/VICODIN) 5-325 MG per tablet 1 tablet (1 tablet Oral Given 03/13/20 1043)  ondansetron (ZOFRAN-ODT) disintegrating tablet 4 mg (4 mg Oral Given 03/13/20 1043)    ED Course  I have reviewed the triage vital signs and the nursing notes.  Pertinent labs & imaging results that were available during my care of the patient were reviewed by me and considered in my medical decision making (see chart for details).    MDM Rules/Calculators/A&P                       59 y/o M presenting with left knee pain after mechanical fall. No head trauma, loc or other injuries.   Xray left knee reviewed/interpreted - neg for fx or dislocation. +joint effusion.   Knee immobilizer given. He has crutches at home. Pain meds given. Ortho f/u given. Advised on return precautions.   Final Clinical Impression(s) / ED Diagnoses Final diagnoses:  Fall, initial encounter  Acute pain of left knee    Rx / DC Orders ED Discharge Orders         Ordered    HYDROcodone-acetaminophen (NORCO/VICODIN) 5-325 MG tablet  Every 8 hours PRN     03/13/20 [address], [place], PA-C 03/13/20 1135    Lucrezia Starch, MD 03/14/20 3009    Lucrezia Starch, MD 03/14/20 [phone]

## 2020-03-13 NOTE — ED Notes (Signed)
Patient transported to X-ray 

## 2020-03-13 NOTE — ED Triage Notes (Signed)
Was working in the yard, tripped and fell and c/o L knee pain.

## 2020-03-13 NOTE — Discharge Instructions (Signed)
Prescription given for Norco. Take medication as directed and do not operate machinery, drive a car, or work while taking this medication as it can make you drowsy.   This medication can made you constipated. If you become constipated, then you should take Miralax.   Please follow up with your [facility] within 5-7 days for re-evaluation of your symptoms. If you do not have a primary care provider, information for a healthcare clinic has been provided for you to make arrangements for follow up care. Please return to the emergency department for any new or worsening symptoms.

## 2020-03-15 ENCOUNTER — Ambulatory Visit: Payer: Self-pay | Admitting: Family Medicine

## 2020-04-02 ENCOUNTER — Other Ambulatory Visit: Payer: Self-pay

## 2020-04-05 ENCOUNTER — Encounter: Payer: Self-pay | Admitting: Family Medicine

## 2020-04-05 ENCOUNTER — Ambulatory Visit (INDEPENDENT_AMBULATORY_CARE_PROVIDER_SITE_OTHER): Payer: Self-pay | Admitting: Family Medicine

## 2020-04-05 ENCOUNTER — Other Ambulatory Visit: Payer: Self-pay

## 2020-04-05 DIAGNOSIS — E1165 Type 2 diabetes mellitus with hyperglycemia: Secondary | ICD-10-CM

## 2020-04-05 LAB — HEMOGLOBIN A1C: Hgb A1c MFr Bld: 7.3 % — ABNORMAL HIGH (ref 4.6–6.5)

## 2020-04-05 MED ORDER — RIVAROXABAN 20 MG PO TABS: 20.0000 mg | ORAL_TABLET | Freq: Every day | ORAL | 0 refills | Status: DC

## 2020-04-05 MED ORDER — PIOGLITAZONE HCL 30 MG PO TABS: 30.0000 mg | ORAL_TABLET | Freq: Every day | ORAL | 2 refills | Status: DC

## 2020-04-05 NOTE — Progress Notes (Signed)
Chief Complaint  Patient presents with  . Diabetes    Subjective: Patient is a 59 y.o. male here for f/u sugars.  Recently started on Actos 30 mg/d. Tolerating well, compliant. Sugars in low-mid 100's again. Diet is better. Hurt knee so limited exercise. Lost 9 lbs intentionally. No low's.   Past Medical History:  Diagnosis Date  . Allergy   . Arthritis   . Biceps tendon rupture    bilateral  . Clostridium difficile infection 2014  . Colon polyps   . Diabetes ([place])    Borderline/Pre-diabetes  . GERD (gastroesophageal reflux disease)   . History of DVT (deep vein thrombosis)   . History of pulmonary embolism   . Hyperlipidemia   . Hypertension   . Internal bleeding hemorrhoids 2012   "might bleed once/month; only when I strain"  . Internal prolapsed hemorrhoids   . Leg cramps   . Testicular hypofunction   . Wart of face    Right cheek    Objective: BP 108/71 (BP Location: Left Arm, Cuff Size: Large)   Pulse 79   Temp 99.1 F (37.3 C) (Temporal)   Resp 12   Ht 5\' 8"  (1.727 m)   Wt 247 lb 9.6 oz (112.3 kg)   SpO2 99%   BMI 37.65 kg/m  General: Awake, appears stated age HEENT: MMM, EOMi Heart: RRR, Trace LE edema pitting Lungs: CTAB, no rales, wheezes or rhonchi. No accessory muscle use Psych: Age appropriate judgment and insight, normal affect and mood  Assessment and Plan: Type 2 diabetes mellitus with hyperglycemia, without long-term current use of insulin ([facility]) - Plan: pioglitazone (ACTOS) 30 MG tablet, Hemoglobin A1c  Ck A1c today. Sugars look much better. Cont Actos.  Fu in 4 mo.  The patient voiced understanding and agreement to the plan.  [place], DO 04/05/20  9:34 AM

## 2020-04-05 NOTE — Patient Instructions (Addendum)
Keep up the good work with your weight loss.  Keep the diet clean and stay active.  Give [place] 2-3 business days to get the results of your labs back.   Let us know if you need anything.

## 2020-04-14 ENCOUNTER — Encounter: Payer: Self-pay | Admitting: General Practice

## 2020-04-22 NOTE — Telephone Encounter (Signed)
Pt's wife dropped off document to be filled out (small white envelope -Document surgical clearance) Pt would like to be called when document ready to pick up tel [phone]. Document put at front office tray under providers name.

## 2020-04-22 NOTE — Telephone Encounter (Signed)
Patient advised Dr. Liston Alba will review forms tomorrow and we will let him know if he needs appointment or not.

## 2020-04-27 ENCOUNTER — Encounter: Payer: Self-pay | Admitting: Family Medicine

## 2020-04-27 ENCOUNTER — Ambulatory Visit (INDEPENDENT_AMBULATORY_CARE_PROVIDER_SITE_OTHER): Payer: Self-pay | Admitting: Family Medicine

## 2020-04-27 ENCOUNTER — Other Ambulatory Visit: Payer: Self-pay

## 2020-04-27 VITALS — BP 130/80 | HR 95 | Temp 96.9°F | Ht 68.0 in | Wt 234.2 lb

## 2020-04-27 DIAGNOSIS — Z01818 Encounter for other preprocedural examination: Secondary | ICD-10-CM

## 2020-04-27 NOTE — Patient Instructions (Addendum)
Give [place] 2-3 business days to get the results of your labs back.   If labs are normal, we will fax over your form after it is complete.  Stop the Xarelto 48 hrs prior to surgery. For example, if your surgery is on a Friday, your last dose will be on that Tuesday.   Let us know if you need anything.

## 2020-04-27 NOTE — Progress Notes (Signed)
Subjective:   Chief Complaint  Patient presents with  . Medical Clearance    Ian [name]  is here for a Pre-operative physical at the request of Dr. Tonita Cong.   He  is having L quad tendon repair surgery.  Personal or family hx of adverse outcome to anesthesia? No  Chipped, cracked, missing, or loose teeth? No  Decreased ROM of neck? No  Able to walk up 2 flights of stairs without becoming significantly short of breath or having chest pain? Yes   Revised Goldman Criteria: High Risk Surgery (intraperitoneal, intrathoracic, aortic): No  Ischemic heart disease (Prior MI, +excercise stress test, angina, nitrate use, Qwave): No  History of heart failure: No  History of cerebrovascular disease: No  History of diabetes: Yes  Insulin therapy for DM: No  Preoperative Cr >2.0: No    Patient Active Problem List   Diagnosis Date Noted  . Type 2 diabetes mellitus with hyperglycemia, without long-term current use of insulin ([place]) 02/27/2020  . Complete rotator cuff tear 12/13/2017  . Claudication ([place]) 09/13/2017  . Allergy to pollen 11/06/2016  . Pulmonary embolism, bilateral ([place])   . Lower leg DVT (deep venous thromboembolism), acute ([place])   . Spinal stenosis of lumbar region at multiple levels 01/20/2015  . Screening, ischemic heart disease 01/03/2015  . Hypertriglyceridemia 08/17/2014  . Obesity 08/17/2014  . Type 2 diabetes mellitus without complication, without long-term current use of insulin ([place]) 07/29/2014  . Hx of adenomatous colonic polyps 06/03/2014  . Essential hypertension, benign 09/13/2013  . Thrombocytopenia ([place]) 09/13/2013  . HTN (hypertension) 05/13/2013  . Hyperlipidemia   . Testicular hypofunction   . Biceps tendon rupture   . GERD (gastroesophageal reflux disease)   . Colon polyps    Past Medical History:  Diagnosis Date  . Allergy   . Arthritis   . Biceps tendon rupture    bilateral  . Clostridium difficile infection 2014  . Colon polyps   . Diabetes ([place])     Borderline/Pre-diabetes  . GERD (gastroesophageal reflux disease)   . History of DVT (deep vein thrombosis)   . History of pulmonary embolism   . Hyperlipidemia   . Hypertension   . Internal bleeding hemorrhoids 2012   "might bleed once/month; only when I strain"  . Internal prolapsed hemorrhoids   . Leg cramps   . Testicular hypofunction   . Wart of face    Right cheek    Past Surgical History:  Procedure Laterality Date  . ANAL FISSURE REPAIR  06/24/2013  . APPENDECTOMY  2011  . COLONOSCOPY W/ BIOPSIES AND POLYPECTOMY  2012  . COLONSCOPY  2015  . EXCISIONAL HEMORRHOIDECTOMY    . FISSURECTOMY    . HEMORRHOID SURGERY  06/24/2013  . internal sphincterotomy  06-24-2013  . LUMBAR LAMINECTOMY/DECOMPRESSION MICRODISCECTOMY N/A 01/20/2015   Procedure: MICRO LUMBAR DECOMPRESSION L4-5/L3-4/L2-3;  Surgeon: Johnn Hai, MD;  Location: [facility];  Service: Orthopedics;  Laterality: N/A;  . SHOULDER ARTHROSCOPY  08/22/2012   Procedure: ARTHROSCOPY SHOULDER;  Surgeon: Marin Shutter, MD;  Location: [facility];  Service: Orthopedics;  Laterality: Left;  Left shoulder arthroscopic lavage and debridement  . SHOULDER ARTHROSCOPY  09/02/2012   Procedure: ARTHROSCOPY SHOULDER;  Surgeon: Marin Shutter, MD;  Location: [facility];  Service: Orthopedics;  Laterality: Left;  Left shoulder arthroscopy irrigation and debridement  . SHOULDER ARTHROSCOPY W/ ROTATOR CUFF REPAIR  07/09/2012   left  . SHOULDER OPEN ROTATOR CUFF REPAIR Right 12/13/2017   Procedure: Right shoulder mini open  rotator cuff repair;  Surgeon: Susa Day, MD;  Location: [facility];  Service: Orthopedics;  Laterality: Right;  Interscalene Block  . SKIN LESION EXCISION     Benign  . WISDOM TOOTH EXTRACTION  YRS AGO    Current Outpatient Medications  Medication Sig Dispense Refill  . Blood Glucose Monitoring Suppl (RELION CONFIRM GLUCOSE MONITOR) W/DEVICE KIT USE AS DIRECTED TO TEST BLOOD GLUCOSE DAILY DX: 250.00 1 kit 0  . Cholecalciferol  (VITAMIN D-3) 5000 UNITS TABS Take 5,000 Units by mouth daily.     Marland Kitchen CINNAMON PO Take 2,400 mg by mouth daily.    Marland Kitchen EPIPEN 2-PAK 0.3 MG/0.3ML SOAJ injection Inject 0.3 mg into the skin once.     . fenofibrate (TRICOR) 145 MG tablet TAKE ONE TABLET BY MOUTH DAILY 90 tablet 6  . glyBURIDE (DIABETA) 5 MG tablet TAKE ONE TABLET BY MOUTH TWICE A DAY WITH MEALS 180 tablet 4  . hydrochlorothiazide (HYDRODIURIL) 25 MG tablet Take 1 tablet (25 mg total) by mouth daily. 90 tablet 1  . magnesium 30 MG tablet Take 800 mg by mouth daily.    Marland Kitchen omeprazole (PRILOSEC) 20 MG capsule Take 1 capsule (20 mg total) by mouth daily. 180 capsule 3  . pioglitazone (ACTOS) 30 MG tablet Take 1 tablet (30 mg total) by mouth daily. 90 tablet 2  . potassium gluconate (RA POTASSIUM GLUCONATE) 595 (99 K) MG TABS tablet Take 2.5 mEq by mouth.    . rivaroxaban (XARELTO) 20 MG TABS tablet TAKE 1 TABLET (20 MG TOTAL) BY MOUTH DAILY WITH SUPPER. 90 tablet 1  . rivaroxaban (XARELTO) 20 MG TABS tablet Take 1 tablet (20 mg total) by mouth daily with supper. 42 tablet 0  . rosuvastatin (CRESTOR) 20 MG tablet Take 1 tablet (20 mg total) by mouth daily. 90 tablet 1   Allergies  Allergen Reactions  . Penicillins Rash, Other (See Comments), Diarrhea and Hives    Has patient had a PCN reaction causing immediate rash, facial/tongue/throat swelling, SOB or lightheadedness with hypotension: Unknown Has patient had a PCN reaction causing severe rash involving mucus membranes or skin necrosis: No Has patient had a PCN reaction that required hospitalization: No Has patient had a PCN reaction occurring within the last 10 years: No If all of the above answers are "NO", then may proceed with Cephalosporin use.   . Tetanus Toxoid, Adsorbed Anaphylaxis  . Tetanus Toxoids Anaphylaxis  . Aspirin Nausea Only and Rash  . Daptomycin Other (See Comments)    myalgias  . Prednisone Other (See Comments) and Rash    Hyperactivity WITH ORAL, CAN TAKE  SHOTS-Cannot do Dose pack  . Bioflavonoids Diarrhea    All citrus fruits  . Other     Cheese  . Tetanus Immune Globulin   . Citrus Diarrhea and Other (See Comments)    All citrus fruits  . Fenofibrate Other (See Comments)    Constipation w/anal fissure [Sx] Other reaction(s): Constipation  . Milk-Related Compounds Diarrhea and Other (See Comments)    Milk and milk products  . Oxycodone Other (See Comments)    Does not help pain just makes him high    Family History  Problem Relation Age of Onset  . Heart disease Mother 29       Deceased  . Hyperlipidemia Mother   . Hypertension Mother   . Heart disease Father 40       Deceased  . CVA Father   . Hypertension Maternal Grandmother   . Hyperlipidemia  Maternal Grandmother   . Cancer Maternal Grandmother        Colon Cancer  . Heart disease Maternal Grandfather   . Hypertension Maternal Grandfather   . Hyperlipidemia Maternal Grandfather   . Cancer Maternal Grandfather        Lung Cancer  . Heart attack Maternal Grandfather   . Heart disease Paternal Grandfather      Review of Systems:  Constitutional:  no fevers Eye:  no recent significant change in vision Ear:  no hearing loss Nose/Mouth/Throat:  No dental complaints Neck/Thyroid:  no lumps or masses Pulmonary:  No shortness of breath Cardiovascular:  no chest pain Gastrointestinal:  no abdominal pain GU:  negative for dysuria Musculoskeletal/Extremities: +L quad pain Skin/Integumentary ROS:  no abnormal skin lesions reported Neurologic:  no HA   Objective:   Vitals:   04/27/20 1459  BP: 130/80  Pulse: 95  Temp: (!) 96.9 F (36.1 C)  TempSrc: Temporal  SpO2: 97%  Weight: 234 lb 4 oz (106.3 kg)  Height: 5' 8" (1.727 m)   Body mass index is 35.62 kg/m.  General:  well developed, well nourished, in no apparent distress Skin:  warm, no pallor or diaphoresis Head:  normocephalic, atraumatic Eyes:  pupils equal and round, sclera anicteric without  injection Ears:  canals without lesions, TMs shiny without retraction, no obvious effusion, no erythema Throat/Pharynx:  lips and gingiva without lesion; tongue and uvula midline; non-inflamed pharynx; no exudates or postnasal drainage Neck: neck supple without adenopathy, thyromegaly, or masses, no bruits, no jugular venous distention Lungs:  clear to auscultation, breath sounds equal bilaterally, no respiratory distress Cardio:  regular rate and rhythm without murmurs Abdomen:  abdomen soft, nontender; bowel sounds normal; no masses, hepatomegaly or splenomegaly Musculoskeletal:  symmetrical muscle groups noted without atrophy or deformity Extremities:  no clubbing, cyanosis, or edema, no deformities, no skin discoloration Neuro:  gait normal; deep tendon reflexes normal and symmetric and alert and oriented to person, place, and time Psych: Age appropriate judgment and insight; normal mood   Assessment:   Pre-op exam - Plan: Basic metabolic panel   Plan:   Orders as above. Ck Cr. If neg, will complete form for pt pickup for procedure.  Pending the above workup, the patient is deemed low cardiac risk for the proposed procedure. Stop Xarelto 48 hrs prior to procedure.   The patient voiced understanding and agreement to the plan.  [place], DO 04/27/20  4:42 PM

## 2020-04-28 ENCOUNTER — Telehealth: Payer: Self-pay | Admitting: Family Medicine

## 2020-04-28 LAB — BASIC METABOLIC PANEL
BUN: 19 mg/dL (ref 6–23)
CO2: 29 mEq/L (ref 19–32)
Calcium: 9.7 mg/dL (ref 8.4–10.5)
Chloride: 100 mEq/L (ref 96–112)
Creatinine, Ser: 1.14 mg/dL (ref 0.40–1.50)
GFR: 65.38 mL/min (ref >60.00)
Glucose, Bld: 105 mg/dL — ABNORMAL HIGH (ref 70–99)
Potassium: 3.7 mEq/L (ref 3.5–5.1)
Sodium: 137 mEq/L (ref 135–145)

## 2020-04-28 NOTE — Telephone Encounter (Signed)
PCP completed Pre Op Clearance. Called the patient to pickup. Made a copy for scan

## 2020-04-29 ENCOUNTER — Ambulatory Visit: Payer: Self-pay | Admitting: Orthopedic Surgery

## 2020-04-29 ENCOUNTER — Other Ambulatory Visit: Payer: Self-pay | Admitting: Family Medicine

## 2020-04-29 MED ORDER — DOXYCYCLINE HYCLATE 100 MG PO TABS
100.0000 mg | ORAL_TABLET | Freq: Two times a day (BID) | ORAL | 0 refills | Status: AC
Start: 2020-04-29 — End: 2020-05-06

## 2020-04-29 NOTE — H&P (View-Only) (Signed)
Ian [name] is an 59 y.o. male.   Chief Complaint: left knee pain HPI: Visit For: Follow up (to discuss MRI) Location: left; knee Duration: The patient is 6 weeks and 4 days out from twisting injury. Patient reports continued problems when he attempts to step up.  Past Medical History:  Diagnosis Date  . Allergy   . Arthritis   . Biceps tendon rupture    bilateral  . Clostridium difficile infection 2014  . Colon polyps   . Diabetes ([place])    Borderline/Pre-diabetes  . GERD (gastroesophageal reflux disease)   . History of DVT (deep vein thrombosis)   . History of pulmonary embolism   . Hyperlipidemia   . Hypertension   . Internal bleeding hemorrhoids 2012   "might bleed once/month; only when I strain"  . Internal prolapsed hemorrhoids   . Leg cramps   . Testicular hypofunction   . Wart of face    Right cheek    Past Surgical History:  Procedure Laterality Date  . ANAL FISSURE REPAIR  06/24/2013  . APPENDECTOMY  2011  . COLONOSCOPY W/ BIOPSIES AND POLYPECTOMY  2012  . COLONSCOPY  2015  . EXCISIONAL HEMORRHOIDECTOMY    . FISSURECTOMY    . HEMORRHOID SURGERY  06/24/2013  . internal sphincterotomy  06-24-2013  . LUMBAR LAMINECTOMY/DECOMPRESSION MICRODISCECTOMY N/A 01/20/2015   Procedure: MICRO LUMBAR DECOMPRESSION L4-5/L3-4/L2-3;  Surgeon: Johnn Hai, MD;  [facility];  Service: Orthopedics;  Laterality: N/A;  . SHOULDER ARTHROSCOPY  08/22/2012   Procedure: ARTHROSCOPY SHOULDER;  Surgeon: Marin Shutter, MD;  Location: [facility];  Service: Orthopedics;  Laterality: Left;  Left shoulder arthroscopic lavage and debridement  . SHOULDER ARTHROSCOPY  09/02/2012   Procedure: ARTHROSCOPY SHOULDER;  Surgeon: Marin Shutter, MD;  Location: [facility];  Service: Orthopedics;  Laterality: Left;  Left shoulder arthroscopy irrigation and debridement  . SHOULDER ARTHROSCOPY W/ ROTATOR CUFF REPAIR  07/09/2012   left  . SHOULDER OPEN ROTATOR CUFF REPAIR Right 12/13/2017   Procedure: Right  shoulder mini open rotator cuff repair;  Surgeon: Susa Day, MD;  [facility];  Service: Orthopedics;  Laterality: Right;  Interscalene Block  . SKIN LESION EXCISION     Benign  . WISDOM TOOTH EXTRACTION  YRS AGO    Family History  Problem Relation Age of Onset  . Heart disease Mother 2       Deceased  . Hyperlipidemia Mother   . Hypertension Mother   . Heart disease Father 94       Deceased  . CVA Father   . Hypertension Maternal Grandmother   . Hyperlipidemia Maternal Grandmother   . Cancer Maternal Grandmother        Colon Cancer  . Heart disease Maternal Grandfather   . Hypertension Maternal Grandfather   . Hyperlipidemia Maternal Grandfather   . Cancer Maternal Grandfather        Lung Cancer  . Heart attack Maternal Grandfather   . Heart disease Paternal Grandfather    Social History:  reports that he has never smoked. He has never used smokeless tobacco. He reports that he does not drink alcohol or use drugs.  Allergies:  Allergies  Allergen Reactions  . Penicillins Rash, Other (See Comments), Diarrhea and Hives    Has patient had a PCN reaction causing immediate rash, facial/tongue/throat swelling, SOB or lightheadedness with hypotension: Unknown Has patient had a PCN reaction causing severe rash involving mucus membranes or skin necrosis: No Has patient had a  PCN reaction that required hospitalization: No Has patient had a PCN reaction occurring within the last 10 years: No If all of the above answers are "NO", then may proceed with Cephalosporin use.   . Tetanus Toxoid, Adsorbed Anaphylaxis  . Tetanus Toxoids Anaphylaxis  . Aspirin Nausea Only and Rash  . Daptomycin Other (See Comments)    myalgias  . Prednisone Other (See Comments) and Rash    Hyperactivity WITH ORAL, CAN TAKE SHOTS-Cannot do Dose pack  . Bioflavonoids Diarrhea    All citrus fruits  . Other     Cheese  . Tetanus Immune Globulin   . Citrus Diarrhea and Other (See Comments)     All citrus fruits  . Fenofibrate Other (See Comments)    Constipation w/anal fissure [Sx] Other reaction(s): Constipation  . Milk-Related Compounds Diarrhea and Other (See Comments)    Milk and milk products  . Oxycodone Other (See Comments)    Does not help pain just makes him high    (Not in a hospital admission)   Results for orders placed or performed in visit on 04/27/20 (from the past 48 hour(s))  Basic metabolic panel     Status: Abnormal   Collection Time: 04/27/20  3:42 PM  Result Value Ref Range   Sodium 137 135 - 145 mEq/L   Potassium 3.7 3.5 - 5.1 mEq/L   Chloride 100 96 - 112 mEq/L   CO2 29 19 - 32 mEq/L   Glucose, Bld 105 (H) 70 - 99 mg/dL   BUN 19 6 - 23 mg/dL   Creatinine, Ser 1.14 0.40 - 1.50 mg/dL   GFR 65.38 >60.00 mL/min   Calcium 9.7 8.4 - 10.5 mg/dL   No results found.  Review of Systems  Constitutional: Negative.   HENT: Negative.   Eyes: Negative.   Respiratory: Negative.   Cardiovascular: Negative.   Gastrointestinal: Negative.   Endocrine: Negative.   Genitourinary: Negative.   Musculoskeletal: Positive for arthralgias and back pain.  Neurological: Negative.     There were no vitals taken for this visit. Physical Exam  Constitutional: He is oriented to person, place, and time. He appears well-developed and well-nourished.  HENT:  Head: Normocephalic.  Eyes: Pupils are equal, round, and reactive to light.  Cardiovascular: Normal rate.  Respiratory: Effort normal.  GI: Soft. Bowel sounds are normal.  Musculoskeletal:     Cervical back: Normal range of motion.     Comments: On exam he is no effusion he is actually able to fully extend the knee. Is some tenderness to the palpation of the distal quadriceps. He has no instability.  Neurological: He is alert and oriented to person, place, and time.    MRI demonstrates high-grade tear of the quadriceps tendon at the patella 3 cm of retraction. More than 50% of the tendon thickness is  involved.  Assessment/Plan Impression:   Patient demonstrates high-grade partial tear of the quadriceps tendon.   Plan: We discussed options. But given the high-grade tear of feel be best to repair the torn portion of the tendon utilizing fixation into the patella.  In the meantime and suggested he use a knee immobilizer to protect the tendon.  The patient currently has no insurance.  We discussed risks and benefits including bleeding infection. He is on Xarelto will require clearance from his medical doctor to come off his Xarelto and then resume it postoperatively.  It would be best if we could utilize the swivel lock suture anchors to minimize the incision  to repair to the quadriceps tendon.  History of an infection on the right shoulder when old anchors were utilized. The indicated these are new or anchors. And that certainly would aid in its fixation. They are nonreactive. We discussed time to full healing.  Plan L quad tendon repair  Cecilie Kicks, PA-C for Dr. Tonita Cong 04/29/2020, 10:30 AM

## 2020-04-29 NOTE — H&P (Signed)
Ian [name] is an 59 y.o. male.   Chief Complaint: left knee pain HPI: Visit For: Follow up (to discuss MRI) Location: left; knee Duration: The patient is 6 weeks and 4 days out from twisting injury. Patient reports continued problems when he attempts to step up.  Past Medical History:  Diagnosis Date  . Allergy   . Arthritis   . Biceps tendon rupture    bilateral  . Clostridium difficile infection 2014  . Colon polyps   . Diabetes ([place])    Borderline/Pre-diabetes  . GERD (gastroesophageal reflux disease)   . History of DVT (deep vein thrombosis)   . History of pulmonary embolism   . Hyperlipidemia   . Hypertension   . Internal bleeding hemorrhoids 2012   "might bleed once/month; only when I strain"  . Internal prolapsed hemorrhoids   . Leg cramps   . Testicular hypofunction   . Wart of face    Right cheek    Past Surgical History:  Procedure Laterality Date  . ANAL FISSURE REPAIR  06/24/2013  . APPENDECTOMY  2011  . COLONOSCOPY W/ BIOPSIES AND POLYPECTOMY  2012  . COLONSCOPY  2015  . EXCISIONAL HEMORRHOIDECTOMY    . FISSURECTOMY    . HEMORRHOID SURGERY  06/24/2013  . internal sphincterotomy  06-24-2013  . LUMBAR LAMINECTOMY/DECOMPRESSION MICRODISCECTOMY N/A 01/20/2015   Procedure: MICRO LUMBAR DECOMPRESSION L4-5/L3-4/L2-3;  Surgeon: Johnn Hai, MD;  [facility];  Service: Orthopedics;  Laterality: N/A;  . SHOULDER ARTHROSCOPY  08/22/2012   Procedure: ARTHROSCOPY SHOULDER;  Surgeon: Marin Shutter, MD;  Location: [facility];  Service: Orthopedics;  Laterality: Left;  Left shoulder arthroscopic lavage and debridement  . SHOULDER ARTHROSCOPY  09/02/2012   Procedure: ARTHROSCOPY SHOULDER;  Surgeon: Marin Shutter, MD;  Location: [facility];  Service: Orthopedics;  Laterality: Left;  Left shoulder arthroscopy irrigation and debridement  . SHOULDER ARTHROSCOPY W/ ROTATOR CUFF REPAIR  07/09/2012   left  . SHOULDER OPEN ROTATOR CUFF REPAIR Right 12/13/2017   Procedure: Right  shoulder mini open rotator cuff repair;  Surgeon: Susa Day, MD;  [facility];  Service: Orthopedics;  Laterality: Right;  Interscalene Block  . SKIN LESION EXCISION     Benign  . WISDOM TOOTH EXTRACTION  YRS AGO    Family History  Problem Relation Age of Onset  . Heart disease Mother 23       Deceased  . Hyperlipidemia Mother   . Hypertension Mother   . Heart disease Father 24       Deceased  . CVA Father   . Hypertension Maternal Grandmother   . Hyperlipidemia Maternal Grandmother   . Cancer Maternal Grandmother        Colon Cancer  . Heart disease Maternal Grandfather   . Hypertension Maternal Grandfather   . Hyperlipidemia Maternal Grandfather   . Cancer Maternal Grandfather        Lung Cancer  . Heart attack Maternal Grandfather   . Heart disease Paternal Grandfather    Social History:  reports that he has never smoked. He has never used smokeless tobacco. He reports that he does not drink alcohol or use drugs.  Allergies:  Allergies  Allergen Reactions  . Penicillins Rash, Other (See Comments), Diarrhea and Hives    Has patient had a PCN reaction causing immediate rash, facial/tongue/throat swelling, SOB or lightheadedness with hypotension: Unknown Has patient had a PCN reaction causing severe rash involving mucus membranes or skin necrosis: No Has patient had a  PCN reaction that required hospitalization: No Has patient had a PCN reaction occurring within the last 10 years: No If all of the above answers are "NO", then may proceed with Cephalosporin use.   . Tetanus Toxoid, Adsorbed Anaphylaxis  . Tetanus Toxoids Anaphylaxis  . Aspirin Nausea Only and Rash  . Daptomycin Other (See Comments)    myalgias  . Prednisone Other (See Comments) and Rash    Hyperactivity WITH ORAL, CAN TAKE SHOTS-Cannot do Dose pack  . Bioflavonoids Diarrhea    All citrus fruits  . Other     Cheese  . Tetanus Immune Globulin   . Citrus Diarrhea and Other (See Comments)     All citrus fruits  . Fenofibrate Other (See Comments)    Constipation w/anal fissure [Sx] Other reaction(s): Constipation  . Milk-Related Compounds Diarrhea and Other (See Comments)    Milk and milk products  . Oxycodone Other (See Comments)    Does not help pain just makes him high    (Not in a hospital admission)   Results for orders placed or performed in visit on 04/27/20 (from the past 48 hour(s))  Basic metabolic panel     Status: Abnormal   Collection Time: 04/27/20  3:42 PM  Result Value Ref Range   Sodium 137 135 - 145 mEq/L   Potassium 3.7 3.5 - 5.1 mEq/L   Chloride 100 96 - 112 mEq/L   CO2 29 19 - 32 mEq/L   Glucose, Bld 105 (H) 70 - 99 mg/dL   BUN 19 6 - 23 mg/dL   Creatinine, Ser 1.14 0.40 - 1.50 mg/dL   GFR 65.38 >60.00 mL/min   Calcium 9.7 8.4 - 10.5 mg/dL   No results found.  Review of Systems  Constitutional: Negative.   HENT: Negative.   Eyes: Negative.   Respiratory: Negative.   Cardiovascular: Negative.   Gastrointestinal: Negative.   Endocrine: Negative.   Genitourinary: Negative.   Musculoskeletal: Positive for arthralgias and back pain.  Neurological: Negative.     There were no vitals taken for this visit. Physical Exam  Constitutional: He is oriented to person, place, and time. He appears well-developed and well-nourished.  HENT:  Head: Normocephalic.  Eyes: Pupils are equal, round, and reactive to light.  Cardiovascular: Normal rate.  Respiratory: Effort normal.  GI: Soft. Bowel sounds are normal.  Musculoskeletal:     Cervical back: Normal range of motion.     Comments: On exam he is no effusion he is actually able to fully extend the knee. Is some tenderness to the palpation of the distal quadriceps. He has no instability.  Neurological: He is alert and oriented to person, place, and time.    MRI demonstrates high-grade tear of the quadriceps tendon at the patella 3 cm of retraction. More than 50% of the tendon thickness is  involved.  Assessment/Plan Impression:   Patient demonstrates high-grade partial tear of the quadriceps tendon.   Plan: We discussed options. But given the high-grade tear of feel be best to repair the torn portion of the tendon utilizing fixation into the patella.  In the meantime and suggested he use a knee immobilizer to protect the tendon.  The patient currently has no insurance.  We discussed risks and benefits including bleeding infection. He is on Xarelto will require clearance from his medical doctor to come off his Xarelto and then resume it postoperatively.  It would be best if we could utilize the swivel lock suture anchors to minimize the incision  to repair to the quadriceps tendon.  History of an infection on the right shoulder when old anchors were utilized. The indicated these are new or anchors. And that certainly would aid in its fixation. They are nonreactive. We discussed time to full healing.  Plan L quad tendon repair  Cecilie Kicks, PA-C for Dr. Tonita Cong 04/29/2020, 10:30 AM

## 2020-05-03 NOTE — Patient Instructions (Addendum)
DUE TO COVID-19 ONLY ONE VISITOR IS ALLOWED TO COME WITH YOU AND STAY IN THE WAITING ROOM ONLY DURING PRE OP AND PROCEDURE DAY OF SURGERY. THE 2 VISITORS  MAY VISIT WITH YOU AFTER SURGERY IN YOUR PRIVATE ROOM DURING VISITING HOURS ONLY!  YOU NEED TO HAVE A COVID 19 TEST ON_5/17______ @_11 :15______, THIS TEST MUST BE DONE BEFORE SURGERY, COME  Ian [name] , [id number].  ([place]) ONCE YOUR COVID TEST IS COMPLETED, PLEASE BEGIN THE QUARANTINE INSTRUCTIONS AS OUTLINED IN YOUR HANDOUT.                Ian [name]    Your procedure is scheduled on: 05/13/20   Report to [facility] Main  Entrance   Report to Short Stay at 5:30 AM     Call this number if you have problems the morning of surgery Ian [name], NO CHEWING GUM Ian [name].    Do not eat food After Midnight  . YOU MAY HAVE CLEAR LIQUIDS FROM MIDNIGHT UNTIL 4:30 AM  . At 4:30 AM Please finish the prescribed Pre-Surgery Gatorade drink.   Nothing by mouth after you finish the Gatorade drink !   Take these medicines the morning of surgery with A SIP OF WATER: Omeprizole  DO NOT TAKE ANY DIABETIC MEDICATIONS DAY OF YOUR SURGERY  How to Manage Your Diabetes Before and After Surgery  Why is it important to control my blood sugar before and after surgery? . Improving blood sugar levels before and after surgery helps healing and can limit problems. . A way of improving blood sugar control is eating a healthy diet by: o  Eating less sugar and carbohydrates o  Increasing activity/exercise o  Talking with your doctor about reaching your blood sugar goals . High blood sugars (greater than 180 mg/dL) can raise your risk of infections and slow your recovery, so you will need to focus on controlling your diabetes during the weeks before surgery. . Make sure that the doctor who takes care of your diabetes knows about your  planned surgery including the date and location.  How do I manage my blood sugar before surgery? . Check your blood sugar at least 4 times a day, starting 2 days before surgery, to make sure that the level is not too high or low. o Check your blood sugar the morning of your surgery when you wake up and every 2 hours until you get to the Short Stay unit. . If your blood sugar is less than 70 mg/dL, you will need to treat for low blood sugar: o Do not take insulin. o Treat a low blood sugar (less than 70 mg/dL) with  cup of clear juice (cranberry or apple), 4 glucose tablets, OR glucose gel. o Recheck blood sugar in 15 minutes after treatment (to make sure it is greater than 70 mg/dL). If your blood sugar is not greater than 70 mg/dL on recheck, call [phone] for further instructions. . Report your blood sugar to the short stay nurse when you get to Short Stay.  . If you are admitted to the hospital after surgery: o Your blood sugar will be checked by the staff and you will probably be given insulin after surgery (instead of oral diabetes medicines) to make sure you have good blood sugar levels. o The goal for blood sugar control after surgery is 80-180 mg/dL.  WHAT DO I DO ABOUT MY DIABETES MEDICATION?  Marland Kitchen Do not take oral diabetes medicines (pills) the morning of surgery.  .               You may not have any metal on your body including              piercings               Do not wear jewelry, lotions, powders or deodorant              Men may shave face and neck.   Do not bring valuables to the hospital. Ian [name].  Contacts, dentures or bridgework may not be worn into surgery.      Patients discharged the day of surgery will not be allowed to drive home.   IF YOU ARE HAVING SURGERY AND GOING HOME THE SAME DAY, YOU MUST HAVE AN ADULT TO DRIVE YOU HOME AND BE WITH YOU FOR 24 HOURS.   YOU MAY GO HOME BY TAXI OR UBER OR  ORTHERWISE, BUT AN ADULT MUST ACCOMPANY YOU HOME AND STAY WITH YOU FOR 24 HOURS.  Name and phone number of your driver:  Special Instructions: N/A              Please read over the following fact sheets you were given: _____________________________________________________________________             Ian [name] - Preparing for Surgery  Before surgery, you can play an important role.   Because skin is not sterile, your skin needs to be as free of germs as possible.   You can reduce the number of germs on your skin by washing with CHG (chlorahexidine gluconate) soap before surgery.   CHG is an antiseptic cleaner which kills germs and bonds with the skin to continue killing germs even after washing. Please DO NOT use if you have an allergy to CHG or antibacterial soaps.   If your skin becomes reddened/irritated stop using the CHG and inform your nurse when you arrive at Short Stay.   You may shave your face/neck.  Please follow these instructions carefully:  1.  Shower with CHG Soap the night before surgery and the  morning of Surgery.  2.  If you choose to wash your hair, wash your hair first as usual with your  normal  shampoo.  3.  After you shampoo, rinse your hair and body thoroughly to remove the  shampoo.                                        4.  Use CHG as you would any other liquid soap.  You can apply chg directly  to the skin and wash                       Gently with a scrungie or clean washcloth.  5.  Apply the CHG Soap to your body ONLY FROM THE NECK DOWN.   Do not use on face/ open                           Wound or open sores. Avoid contact with eyes, ears mouth and genitals (private parts).  Wash face,  Genitals (private parts) with your normal soap.             6.  Wash thoroughly, paying special attention to the area where your surgery  will be performed.  7.  Thoroughly rinse your body with warm water from the neck down.  8.  DO NOT shower/wash  with your normal soap after using and rinsing off  the CHG Soap.             9.  Pat yourself dry with a clean towel.            10.  Wear clean pajamas.            11.  Place clean sheets on your bed the night of your first shower and do not  sleep with pets. Day of Surgery : Do not apply any lotions/deodorants the morning of surgery.  Please wear clean clothes to the hospital/surgery center.  FAILURE TO FOLLOW THESE INSTRUCTIONS MAY RESULT IN THE CANCELLATION OF YOUR SURGERY PATIENT SIGNATURE_________________________________  NURSE SIGNATURE__________________________________  ________________________________________________________________________   [name]  An incentive spirometer is a tool that can help keep your lungs clear and active. This tool measures how well you are filling your lungs with each breath. Taking long deep breaths may help reverse or decrease the chance of developing breathing (pulmonary) problems (especially infection) following:  A long period of time when you are unable to move or be active. BEFORE THE PROCEDURE   If the spirometer includes an indicator to show your best effort, your nurse or respiratory therapist will set it to a desired goal.  If possible, sit up straight or lean slightly forward. Try not to slouch.  Hold the incentive spirometer in an upright position. INSTRUCTIONS FOR USE  1. Sit on the edge of your bed if possible, or sit up as far as you can in bed or on a chair. 2. Hold the incentive spirometer in an upright position. 3. Breathe out normally. 4. Place the mouthpiece in your mouth and seal your lips tightly around it. 5. Breathe in slowly and as deeply as possible, raising the piston or the ball toward the top of the column. 6. Hold your breath for 3-5 seconds or for as long as possible. Allow the piston or ball to fall to the bottom of the column. 7. Remove the mouthpiece from your mouth and breathe out  normally. 8. Rest for a few seconds and repeat Steps 1 through 7 at least 10 times every 1-2 hours when you are awake. Take your time and take a few normal breaths between deep breaths. 9. The spirometer may include an indicator to show your best effort. Use the indicator as a goal to work toward during each repetition. 10. After each set of 10 deep breaths, practice coughing to be sure your lungs are clear. If you have an incision (the cut made at the time of surgery), support your incision when coughing by placing a pillow or rolled up towels firmly against it. Once you are able to get out of bed, walk around indoors and cough well. You may stop using the incentive spirometer when instructed by your caregiver.  RISKS AND COMPLICATIONS  Take your time so you do not get dizzy or light-headed.  If you are in pain, you may need to take or ask for pain medication before doing incentive spirometry. It is harder to take a deep breath if you are having pain. AFTER USE  Rest and breathe slowly and easily.  It can be helpful to keep track of a log of your progress. Your caregiver can provide you with a simple table to help with this. If you are using the spirometer at home, follow these instructions: [place] IF:   You are having difficultly using the spirometer.  You have trouble using the spirometer as often as instructed.  Your pain medication is not giving enough relief while using the spirometer.  You develop fever of 100.5 F (38.1 C) or higher. SEEK IMMEDIATE MEDICAL CARE IF:   You cough up bloody sputum that had not been present before.  You develop fever of 102 F (38.9 C) or greater.  You develop worsening pain at or near the incision site. MAKE SURE YOU:   Understand these instructions.  Will watch your condition.  Will get help right away if you are not doing well or get worse. Document Released: 04/23/2007 Document Revised: 03/04/2012 Document Reviewed:  06/24/2007 [facility] Patient Information 2014 [place], [place].   ________________________________________________________________________

## 2020-05-06 ENCOUNTER — Encounter (HOSPITAL_COMMUNITY)
Admission: RE | Admit: 2020-05-06 | Discharge: 2020-05-06 | Disposition: A | Discharge status: Home / Self Care | Payer: Self-pay | Source: Ambulatory Visit | Attending: Specialist | Admitting: Specialist

## 2020-05-06 ENCOUNTER — Other Ambulatory Visit: Payer: Self-pay

## 2020-05-06 ENCOUNTER — Encounter (HOSPITAL_COMMUNITY): Payer: Self-pay

## 2020-05-06 DIAGNOSIS — Z01818 Encounter for other preprocedural examination: Secondary | ICD-10-CM | POA: Insufficient documentation

## 2020-05-06 HISTORY — DX: Myoneural disorder, unspecified: G70.9

## 2020-05-06 NOTE — Progress Notes (Signed)
PCP - Dr Nani Ravens Cardiologist -Dr. Meda Coffee   Chest x-ray - no EKG - 05/10/20 Stress Test - no ECHO - no Cardiac Cath - no  Sleep Study - no CPAP - no  Fasting Blood Sugar - 90-115 Checks Blood Sugar _QD____ times a day  Blood Thinner Instructions:Xarelto Aspirin Instructions:Dr. Nani Ravens said to stop 3 days before Last Dose:05/10/20  Anesthesia review:   Patient denies shortness of breath, fever, cough and chest pain at PAT appointment yes  Patient verbalized understanding of instructions that were given to them at the PAT appointment. Patient was also instructed that they will need to review over the PAT instructions again at home before surgery. yes

## 2020-05-10 ENCOUNTER — Other Ambulatory Visit: Payer: Self-pay

## 2020-05-10 ENCOUNTER — Other Ambulatory Visit (HOSPITAL_COMMUNITY)
Admission: RE | Admit: 2020-05-10 | Discharge: 2020-05-10 | Disposition: A | Discharge status: Home / Self Care | Payer: HRSA Program | Source: Ambulatory Visit | Attending: Specialist | Admitting: Specialist

## 2020-05-10 ENCOUNTER — Encounter (HOSPITAL_COMMUNITY)
Admission: RE | Admit: 2020-05-10 | Discharge: 2020-05-10 | Disposition: A | Discharge status: Home / Self Care | Payer: Self-pay | Source: Ambulatory Visit | Attending: Specialist | Admitting: Specialist

## 2020-05-10 DIAGNOSIS — Z01812 Encounter for preprocedural laboratory examination: Secondary | ICD-10-CM | POA: Diagnosis present

## 2020-05-10 DIAGNOSIS — Z01818 Encounter for other preprocedural examination: Secondary | ICD-10-CM | POA: Insufficient documentation

## 2020-05-10 DIAGNOSIS — Z20822 Contact with and (suspected) exposure to covid-19: Secondary | ICD-10-CM | POA: Diagnosis not present

## 2020-05-10 LAB — BASIC METABOLIC PANEL
Anion gap: 10 (ref 5–15)
BUN: 16 mg/dL (ref 6–20)
CO2: 29 mmol/L (ref 22–32)
Calcium: 9.6 mg/dL (ref 8.9–10.3)
Chloride: 103 mmol/L (ref 98–111)
Creatinine, Ser: 1.06 mg/dL (ref 0.61–1.24)
GFR calc Af Amer: >60 mL/min (ref >60)
GFR calc non Af Amer: >60 mL/min (ref >60)
Glucose, Bld: 118 mg/dL — ABNORMAL HIGH (ref 70–99)
Potassium: 4.2 mmol/L (ref 3.5–5.1)
Sodium: 142 mmol/L (ref 135–145)

## 2020-05-10 LAB — PROTIME-INR
INR: 1.1 (ref 0.8–1.2)
Prothrombin Time: 13.6 seconds (ref 11.4–15.2)

## 2020-05-10 LAB — CBC
HCT: 45.4 % (ref 39.0–52.0)
Hemoglobin: 15.5 g/dL (ref 13.0–17.0)
MCH: 31.8 pg (ref 26.0–34.0)
MCHC: 34.1 g/dL (ref 30.0–36.0)
MCV: 93.2 fL (ref 80.0–100.0)
Platelets: 145 10*3/uL — ABNORMAL LOW (ref 150–400)
RBC: 4.87 MIL/uL (ref 4.22–5.81)
RDW: 13.1 % (ref 11.5–15.5)
WBC: 5.3 10*3/uL (ref 4.0–10.5)
nRBC: 0 % (ref 0.0–0.2)

## 2020-05-10 LAB — GLUCOSE, CAPILLARY: Glucose-Capillary: 113 mg/dL — ABNORMAL HIGH (ref 70–99)

## 2020-05-10 LAB — SARS CORONAVIRUS 2 (TAT 6-24 HRS): SARS Coronavirus 2: NEGATIVE

## 2020-05-10 LAB — APTT: aPTT: 33 seconds (ref 24–36)

## 2020-05-10 NOTE — Progress Notes (Signed)
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 No pre op drink was given.

## 2020-05-11 LAB — HEMOGLOBIN A1C
Hgb A1c MFr Bld: 6.9 % — ABNORMAL HIGH (ref 4.8–5.6)
Mean Plasma Glucose: 151 mg/dL

## 2020-05-12 ENCOUNTER — Encounter (HOSPITAL_COMMUNITY): Payer: Self-pay | Admitting: Specialist

## 2020-05-12 MED ORDER — VANCOMYCIN HCL 1500 MG/300ML IV SOLN
1500.0000 mg | INTRAVENOUS | Status: AC
  Administered 2020-05-13: 1500 mg via INTRAVENOUS
  Filled 2020-05-12: qty 300

## 2020-05-12 NOTE — Anesthesia Preprocedure Evaluation (Addendum)
Anesthesia Evaluation  Patient identified by MRN, date of birth, ID band Patient awake    Reviewed: Allergy & Precautions, NPO status , Patient's Chart, lab work & pertinent test results  History of Anesthesia Complications Negative for: history of anesthetic complications  Airway Mallampati: III  TM Distance: >3 FB Neck ROM: Full    Dental  (+) Dental Advisory Given, Teeth Intact   Pulmonary PE   Pulmonary exam normal        Cardiovascular hypertension, Pt. on medications + DVT  Normal cardiovascular exam     Neuro/Psych negative neurological ROS  negative psych ROS   GI/Hepatic Neg liver ROS, GERD  Medicated and Controlled,  Endo/Other  diabetes, Type 2, Oral Hypoglycemic Agents Obesity   Renal/GU negative Renal ROS     Musculoskeletal  (+) Arthritis ,   Abdominal   Peds  Hematology negative hematology ROS (+)   Anesthesia Other Findings Covid neg 5/17   Reproductive/Obstetrics                            Anesthesia Physical Anesthesia Plan  ASA: III  Anesthesia Plan: General   Post-op Pain Management:    Induction: Intravenous  PONV Risk Score and Plan: 2 and Treatment may vary due to age or medical condition, Ondansetron, Dexamethasone and Midazolam  Airway Management Planned: Oral ETT  Additional Equipment: None  Intra-op Plan:   Post-operative Plan: Extubation in OR  Informed Consent: I have reviewed the patients History and Physical, chart, labs and discussed the procedure including the risks, benefits and alternatives for the proposed anesthesia with the patient or authorized representative who has indicated his/her understanding and acceptance.     Dental advisory given  Plan Discussed with: CRNA and Anesthesiologist  Anesthesia Plan Comments:        Anesthesia Quick Evaluation

## 2020-05-13 ENCOUNTER — Ambulatory Visit (HOSPITAL_COMMUNITY): Payer: Self-pay | Admitting: Anesthesiology

## 2020-05-13 ENCOUNTER — Observation Stay (HOSPITAL_COMMUNITY)
Admission: RE | Admit: 2020-05-13 | Discharge: 2020-05-14 | Disposition: A | Discharge status: Home / Self Care | Payer: Self-pay | Attending: Specialist | Admitting: Specialist

## 2020-05-13 ENCOUNTER — Encounter (HOSPITAL_COMMUNITY): Payer: Self-pay | Admitting: Specialist

## 2020-05-13 ENCOUNTER — Encounter (HOSPITAL_COMMUNITY)
Admission: RE | Disposition: A | Discharge status: Home / Self Care | Payer: Self-pay | Source: Home / Self Care | Attending: Specialist

## 2020-05-13 DIAGNOSIS — Z8249 Family history of ischemic heart disease and other diseases of the circulatory system: Secondary | ICD-10-CM | POA: Insufficient documentation

## 2020-05-13 DIAGNOSIS — X500XXA Overexertion from strenuous movement or load, initial encounter: Secondary | ICD-10-CM | POA: Insufficient documentation

## 2020-05-13 DIAGNOSIS — Z885 Allergy status to narcotic agent status: Secondary | ICD-10-CM | POA: Insufficient documentation

## 2020-05-13 DIAGNOSIS — Z823 Family history of stroke: Secondary | ICD-10-CM | POA: Insufficient documentation

## 2020-05-13 DIAGNOSIS — Z886 Allergy status to analgesic agent status: Secondary | ICD-10-CM | POA: Insufficient documentation

## 2020-05-13 DIAGNOSIS — Z86010 Personal history of colonic polyps: Secondary | ICD-10-CM | POA: Insufficient documentation

## 2020-05-13 DIAGNOSIS — Z6837 Body mass index (BMI) 37.0-37.9, adult: Secondary | ICD-10-CM | POA: Insufficient documentation

## 2020-05-13 DIAGNOSIS — E785 Hyperlipidemia, unspecified: Secondary | ICD-10-CM | POA: Insufficient documentation

## 2020-05-13 DIAGNOSIS — Z887 Allergy status to serum and vaccine status: Secondary | ICD-10-CM | POA: Insufficient documentation

## 2020-05-13 DIAGNOSIS — Z91018 Allergy to other foods: Secondary | ICD-10-CM | POA: Insufficient documentation

## 2020-05-13 DIAGNOSIS — Z8349 Family history of other endocrine, nutritional and metabolic diseases: Secondary | ICD-10-CM | POA: Insufficient documentation

## 2020-05-13 DIAGNOSIS — E119 Type 2 diabetes mellitus without complications: Secondary | ICD-10-CM | POA: Insufficient documentation

## 2020-05-13 DIAGNOSIS — Z888 Allergy status to other drugs, medicaments and biological substances status: Secondary | ICD-10-CM | POA: Insufficient documentation

## 2020-05-13 DIAGNOSIS — Z86711 Personal history of pulmonary embolism: Secondary | ICD-10-CM | POA: Insufficient documentation

## 2020-05-13 DIAGNOSIS — Z880 Allergy status to penicillin: Secondary | ICD-10-CM | POA: Insufficient documentation

## 2020-05-13 DIAGNOSIS — S76112A Strain of left quadriceps muscle, fascia and tendon, initial encounter: Principal | ICD-10-CM | POA: Insufficient documentation

## 2020-05-13 DIAGNOSIS — Y9389 Activity, other specified: Secondary | ICD-10-CM | POA: Insufficient documentation

## 2020-05-13 DIAGNOSIS — M25462 Effusion, left knee: Secondary | ICD-10-CM | POA: Insufficient documentation

## 2020-05-13 DIAGNOSIS — I10 Essential (primary) hypertension: Secondary | ICD-10-CM | POA: Insufficient documentation

## 2020-05-13 DIAGNOSIS — Z801 Family history of malignant neoplasm of trachea, bronchus and lung: Secondary | ICD-10-CM | POA: Insufficient documentation

## 2020-05-13 DIAGNOSIS — Z7984 Long term (current) use of oral hypoglycemic drugs: Secondary | ICD-10-CM | POA: Insufficient documentation

## 2020-05-13 DIAGNOSIS — Z79899 Other long term (current) drug therapy: Secondary | ICD-10-CM | POA: Insufficient documentation

## 2020-05-13 DIAGNOSIS — M1712 Unilateral primary osteoarthritis, left knee: Secondary | ICD-10-CM | POA: Insufficient documentation

## 2020-05-13 DIAGNOSIS — Z86718 Personal history of other venous thrombosis and embolism: Secondary | ICD-10-CM | POA: Insufficient documentation

## 2020-05-13 DIAGNOSIS — E669 Obesity, unspecified: Secondary | ICD-10-CM | POA: Insufficient documentation

## 2020-05-13 DIAGNOSIS — Z800 Family history of malignant neoplasm of digestive organs: Secondary | ICD-10-CM | POA: Insufficient documentation

## 2020-05-13 DIAGNOSIS — Z7901 Long term (current) use of anticoagulants: Secondary | ICD-10-CM | POA: Insufficient documentation

## 2020-05-13 DIAGNOSIS — K219 Gastro-esophageal reflux disease without esophagitis: Secondary | ICD-10-CM | POA: Insufficient documentation

## 2020-05-13 DIAGNOSIS — T148XXA Other injury of unspecified body region, initial encounter: Secondary | ICD-10-CM | POA: Diagnosis present

## 2020-05-13 DIAGNOSIS — Z91011 Allergy to milk products: Secondary | ICD-10-CM | POA: Insufficient documentation

## 2020-05-13 HISTORY — PX: QUADRICEPS TENDON REPAIR: SHX756

## 2020-05-13 LAB — GLUCOSE, CAPILLARY
Glucose-Capillary: 85 mg/dL (ref 70–99)
Glucose-Capillary: 135 mg/dL — ABNORMAL HIGH (ref 70–99)
Glucose-Capillary: 184 mg/dL — ABNORMAL HIGH (ref 70–99)
Glucose-Capillary: 223 mg/dL — ABNORMAL HIGH (ref 70–99)
Glucose-Capillary: 213 mg/dL — ABNORMAL HIGH (ref 70–99)

## 2020-05-13 SURGERY — REPAIR, TENDON, QUADRICEPS
Anesthesia: General | Site: Leg Upper | Laterality: Left

## 2020-05-13 MED ORDER — FENTANYL CITRATE (PF) 100 MCG/2ML IJ SOLN
INTRAMUSCULAR | Status: AC
  Filled 2020-05-13: qty 2

## 2020-05-13 MED ORDER — ONDANSETRON HCL 4 MG/2ML IJ SOLN
INTRAMUSCULAR | Status: AC
  Filled 2020-05-13: qty 2

## 2020-05-13 MED ORDER — MIDAZOLAM HCL 2 MG/2ML IJ SOLN
INTRAMUSCULAR | Status: AC
  Filled 2020-05-13: qty 2

## 2020-05-13 MED ORDER — PROMETHAZINE HCL 25 MG/ML IJ SOLN
6.2500 mg | INTRAMUSCULAR | Status: DC | PRN
  Administered 2020-05-13: 6.25 mg via INTRAVENOUS

## 2020-05-13 MED ORDER — DOCUSATE SODIUM 100 MG PO CAPS
100.0000 mg | ORAL_CAPSULE | Freq: Two times a day (BID) | ORAL | Status: DC
  Administered 2020-05-13 – 2020-05-14 (×3): 100 mg via ORAL
  Filled 2020-05-13 (×3): qty 1

## 2020-05-13 MED ORDER — BUPIVACAINE-EPINEPHRINE 0.5% -1:200000 IJ SOLN
INTRAMUSCULAR | Status: AC
  Filled 2020-05-13: qty 1

## 2020-05-13 MED ORDER — RIVAROXABAN 10 MG PO TABS: 10.0000 mg | ORAL_TABLET | Freq: Every day | ORAL | Status: DC

## 2020-05-13 MED ORDER — INSULIN ASPART 100 UNIT/ML ~~LOC~~ SOLN
0.0000 [IU] | Freq: Three times a day (TID) | SUBCUTANEOUS | Status: DC
  Administered 2020-05-13: 5 [IU] via SUBCUTANEOUS
  Administered 2020-05-14: 2 [IU] via SUBCUTANEOUS

## 2020-05-13 MED ORDER — FENTANYL CITRATE (PF) 100 MCG/2ML IJ SOLN
25.0000 ug | INTRAMUSCULAR | Status: DC | PRN
  Administered 2020-05-13: 25 ug via INTRAVENOUS

## 2020-05-13 MED ORDER — EPHEDRINE SULFATE-NACL 50-0.9 MG/10ML-% IV SOSY
PREFILLED_SYRINGE | INTRAVENOUS | Status: DC | PRN
  Administered 2020-05-13 (×3): 5 mg via INTRAVENOUS

## 2020-05-13 MED ORDER — ROCURONIUM BROMIDE 10 MG/ML (PF) SYRINGE
PREFILLED_SYRINGE | INTRAVENOUS | Status: AC
  Filled 2020-05-13: qty 10

## 2020-05-13 MED ORDER — FENOFIBRATE 160 MG PO TABS
160.0000 mg | ORAL_TABLET | Freq: Every day | ORAL | Status: DC
  Administered 2020-05-13 – 2020-05-14 (×2): 160 mg via ORAL
  Filled 2020-05-13 (×2): qty 1

## 2020-05-13 MED ORDER — SODIUM CHLORIDE 0.9 % IV SOLN
INTRAVENOUS | Status: DC | PRN
  Administered 2020-05-13: 500 mL

## 2020-05-13 MED ORDER — POTASSIUM CHLORIDE IN NACL 20-0.45 MEQ/L-% IV SOLN
INTRAVENOUS | Status: DC
  Filled 2020-05-13 (×2): qty 1000

## 2020-05-13 MED ORDER — FENTANYL CITRATE (PF) 100 MCG/2ML IJ SOLN
INTRAMUSCULAR | Status: DC | PRN
  Administered 2020-05-13: 100 ug via INTRAVENOUS
  Administered 2020-05-13 (×4): 50 ug via INTRAVENOUS

## 2020-05-13 MED ORDER — RIVAROXABAN 10 MG PO TABS
10.0000 mg | ORAL_TABLET | Freq: Every day | ORAL | Status: DC
  Administered 2020-05-14: 10 mg via ORAL
  Filled 2020-05-13: qty 1

## 2020-05-13 MED ORDER — POLYETHYLENE GLYCOL 3350 17 G PO PACK: 17.0000 g | PACK | Freq: Every day | ORAL | Status: DC | PRN

## 2020-05-13 MED ORDER — DEXAMETHASONE SODIUM PHOSPHATE 10 MG/ML IJ SOLN
INTRAMUSCULAR | Status: AC
  Filled 2020-05-13: qty 1

## 2020-05-13 MED ORDER — LACTATED RINGERS IV SOLN: INTRAVENOUS | Status: DC

## 2020-05-13 MED ORDER — LIDOCAINE 2% (20 MG/ML) 5 ML SYRINGE
INTRAMUSCULAR | Status: DC | PRN
  Administered 2020-05-13: 100 mg via INTRAVENOUS

## 2020-05-13 MED ORDER — SUGAMMADEX SODIUM 200 MG/2ML IV SOLN
INTRAVENOUS | Status: DC | PRN
  Administered 2020-05-13: 300 mg via INTRAVENOUS

## 2020-05-13 MED ORDER — KCL IN DEXTROSE-NACL 20-5-0.45 MEQ/L-%-% IV SOLN
INTRAVENOUS | Status: DC
  Filled 2020-05-13: qty 1000

## 2020-05-13 MED ORDER — METHOCARBAMOL 500 MG IVPB - SIMPLE MED
500.0000 mg | Freq: Four times a day (QID) | INTRAVENOUS | Status: DC | PRN
  Administered 2020-05-13: 500 mg via INTRAVENOUS
  Filled 2020-05-13: qty 50

## 2020-05-13 MED ORDER — HYDROCODONE-ACETAMINOPHEN 5-325 MG PO TABS
1.0000 | ORAL_TABLET | ORAL | Status: DC | PRN
  Administered 2020-05-13 (×2): 2 via ORAL
  Administered 2020-05-13 – 2020-05-14 (×2): 1 via ORAL
  Administered 2020-05-14: 2 via ORAL
  Filled 2020-05-13 (×3): qty 2
  Filled 2020-05-13: qty 1

## 2020-05-13 MED ORDER — DEXAMETHASONE SODIUM PHOSPHATE 10 MG/ML IJ SOLN
INTRAMUSCULAR | Status: DC | PRN
  Administered 2020-05-13: 8 mg via INTRAVENOUS

## 2020-05-13 MED ORDER — PROPOFOL 10 MG/ML IV BOLUS
INTRAVENOUS | Status: DC | PRN
  Administered 2020-05-13: 200 mg via INTRAVENOUS

## 2020-05-13 MED ORDER — PROPOFOL 10 MG/ML IV BOLUS
INTRAVENOUS | Status: AC
  Filled 2020-05-13: qty 60

## 2020-05-13 MED ORDER — ZOLPIDEM TARTRATE 5 MG PO TABS
5.0000 mg | ORAL_TABLET | Freq: Every evening | ORAL | Status: DC | PRN
  Administered 2020-05-13: 5 mg via ORAL
  Filled 2020-05-13: qty 1

## 2020-05-13 MED ORDER — VANCOMYCIN HCL IN DEXTROSE 1-5 GM/200ML-% IV SOLN
1000.0000 mg | Freq: Two times a day (BID) | INTRAVENOUS | Status: DC
  Administered 2020-05-13 – 2020-05-14 (×2): 1000 mg via INTRAVENOUS
  Filled 2020-05-13 (×2): qty 200

## 2020-05-13 MED ORDER — ROCURONIUM BROMIDE 10 MG/ML (PF) SYRINGE
PREFILLED_SYRINGE | INTRAVENOUS | Status: DC | PRN
  Administered 2020-05-13 (×2): 10 mg via INTRAVENOUS
  Administered 2020-05-13: 60 mg via INTRAVENOUS

## 2020-05-13 MED ORDER — ONDANSETRON HCL 4 MG/2ML IJ SOLN
INTRAMUSCULAR | Status: DC | PRN
  Administered 2020-05-13 (×2): 4 mg via INTRAVENOUS

## 2020-05-13 MED ORDER — PANTOPRAZOLE SODIUM 40 MG PO TBEC
40.0000 mg | DELAYED_RELEASE_TABLET | Freq: Every day | ORAL | Status: DC
  Administered 2020-05-14: 40 mg via ORAL
  Filled 2020-05-13: qty 1

## 2020-05-13 MED ORDER — HYDROCODONE-ACETAMINOPHEN 5-325 MG PO TABS
ORAL_TABLET | ORAL | Status: AC
  Administered 2020-05-13: 2
  Filled 2020-05-13: qty 1

## 2020-05-13 MED ORDER — PIOGLITAZONE HCL 30 MG PO TABS
30.0000 mg | ORAL_TABLET | Freq: Every day | ORAL | Status: DC
  Administered 2020-05-14: 30 mg via ORAL
  Filled 2020-05-13: qty 1

## 2020-05-13 MED ORDER — BUPIVACAINE-EPINEPHRINE 0.5% -1:200000 IJ SOLN
INTRAMUSCULAR | Status: DC | PRN
  Administered 2020-05-13: 25 mL

## 2020-05-13 MED ORDER — GLYBURIDE 5 MG PO TABS
5.0000 mg | ORAL_TABLET | Freq: Two times a day (BID) | ORAL | Status: DC
  Administered 2020-05-13 – 2020-05-14 (×2): 5 mg via ORAL
  Filled 2020-05-13 (×2): qty 1

## 2020-05-13 MED ORDER — HYDROCODONE-ACETAMINOPHEN 5-325 MG PO TABS
ORAL_TABLET | ORAL | Status: AC
  Administered 2020-05-13: 1 via ORAL
  Filled 2020-05-13: qty 1

## 2020-05-13 MED ORDER — POTASSIUM GLUCONATE 595 (99 K) MG PO TABS
595.0000 mg | ORAL_TABLET | Freq: Every day | ORAL | Status: DC
  Administered 2020-05-13 – 2020-05-14 (×2): 595 mg via ORAL
  Filled 2020-05-13 (×2): qty 1

## 2020-05-13 MED ORDER — LIDOCAINE 2% (20 MG/ML) 5 ML SYRINGE
INTRAMUSCULAR | Status: AC
  Filled 2020-05-13: qty 5

## 2020-05-13 MED ORDER — FENTANYL CITRATE (PF) 100 MCG/2ML IJ SOLN
INTRAMUSCULAR | Status: AC
  Administered 2020-05-13: 50 ug via INTRAVENOUS
  Filled 2020-05-13: qty 2

## 2020-05-13 MED ORDER — HYDROMORPHONE HCL 1 MG/ML IJ SOLN
1.0000 mg | INTRAMUSCULAR | Status: DC | PRN
  Administered 2020-05-13: 1 mg via INTRAVENOUS
  Filled 2020-05-13: qty 1

## 2020-05-13 MED ORDER — SODIUM CHLORIDE 0.9 % IV SOLN
Freq: Once | INTRAVENOUS | Status: DC
  Filled 2020-05-13: qty 500000

## 2020-05-13 MED ORDER — MIDAZOLAM HCL 5 MG/5ML IJ SOLN
INTRAMUSCULAR | Status: DC | PRN
  Administered 2020-05-13: 2 mg via INTRAVENOUS

## 2020-05-13 MED ORDER — PROMETHAZINE HCL 25 MG/ML IJ SOLN
INTRAMUSCULAR | Status: AC
  Filled 2020-05-13: qty 1

## 2020-05-13 MED ORDER — EPHEDRINE 5 MG/ML INJ
INTRAVENOUS | Status: AC
  Filled 2020-05-13: qty 10

## 2020-05-13 MED ORDER — METHOCARBAMOL 500 MG IVPB - SIMPLE MED
INTRAVENOUS | Status: AC
  Filled 2020-05-13: qty 50

## 2020-05-13 SURGICAL SUPPLY — 73 items
ANCHOR PEEK 4.75X19.1 SWLK C (Anchor) ×3 IMPLANT
ANCHOR SUT KEITH ABD SZ2 STR (SUTURE) ×3 IMPLANT
BAG ZIPLOCK 12X15 (MISCELLANEOUS) IMPLANT
BANDAGE ESMARK 6X9 LF (GAUZE/BANDAGES/DRESSINGS) ×1 IMPLANT
BIT DRILL 2.4X128 (BIT) IMPLANT
BIT DRILL 2.4X128MM (BIT)
BIT DRILL 2.8X128 (BIT) ×2 IMPLANT
BIT DRILL 2.8X128MM (BIT) ×1
BNDG COHESIVE 4X5 TAN STRL (GAUZE/BANDAGES/DRESSINGS) ×3 IMPLANT
BNDG ELASTIC 6X10 VLCR STRL LF (GAUZE/BANDAGES/DRESSINGS) ×3 IMPLANT
BNDG ELASTIC 6X15 VLCR STRL LF (GAUZE/BANDAGES/DRESSINGS) ×3 IMPLANT
BNDG ESMARK 6X9 LF (GAUZE/BANDAGES/DRESSINGS) ×3
COVER SURGICAL LIGHT HANDLE (MISCELLANEOUS) ×3 IMPLANT
COVER WAND RF STERILE (DRAPES) IMPLANT
CUFF TOURN SGL QUICK 34 (TOURNIQUET CUFF) ×3
CUFF TRNQT CYL 34X4.125X (TOURNIQUET CUFF) ×1 IMPLANT
DRAPE ORTHO SPLIT 77X108 STRL (DRAPES) ×6
DRAPE POUCH INSTRU U-SHP 10X18 (DRAPES) ×3 IMPLANT
DRAPE SHEET LG 3/4 BI-LAMINATE (DRAPES) IMPLANT
DRAPE SURG ORHT 6 SPLT 77X108 (DRAPES) ×2 IMPLANT
DRAPE U-SHAPE 47X51 STRL (DRAPES) ×3 IMPLANT
DRSG ADAPTIC 3X8 NADH LF (GAUZE/BANDAGES/DRESSINGS) IMPLANT
DRSG AQUACEL AG ADV 3.5X 6 (GAUZE/BANDAGES/DRESSINGS) ×3 IMPLANT
DRSG AQUACEL AG ADV 3.5X10 (GAUZE/BANDAGES/DRESSINGS) IMPLANT
DRSG PAD ABDOMINAL 8X10 ST (GAUZE/BANDAGES/DRESSINGS) IMPLANT
DURAPREP 26ML APPLICATOR (WOUND CARE) ×3 IMPLANT
ELECT REM PT RETURN 15FT ADLT (MISCELLANEOUS) ×3 IMPLANT
GAUZE SPONGE 4X4 12PLY STRL (GAUZE/BANDAGES/DRESSINGS) IMPLANT
GLOVE BIOGEL PI IND STRL 7.5 (GLOVE) ×1 IMPLANT
GLOVE BIOGEL PI IND STRL 8 (GLOVE) ×1 IMPLANT
GLOVE BIOGEL PI INDICATOR 7.5 (GLOVE) ×2
GLOVE BIOGEL PI INDICATOR 8 (GLOVE) ×2
GLOVE SURG SS PI 7.5 STRL IVOR (GLOVE) ×6 IMPLANT
GLOVE SURG SS PI 8.0 STRL IVOR (GLOVE) ×3 IMPLANT
GOWN STRL REUS W/TWL XL LVL3 (GOWN DISPOSABLE) ×6 IMPLANT
IMMOBILIZER KNEE 20 (SOFTGOODS) ×3
IMMOBILIZER KNEE 20 THIGH 36 (SOFTGOODS) ×1 IMPLANT
KIT ANCHOR IMP ACH MID SPEED P (Orthopedic Implant) ×3 IMPLANT
KIT BASIN (CUSTOM PROCEDURE TRAY) ×3 IMPLANT
KIT BIO-TENODESIS 3X8 DISP (MISCELLANEOUS) ×3
KIT INSRT BABSR STRL DISP BTN (MISCELLANEOUS) ×1 IMPLANT
KIT TURNOVER KIT A (KITS) IMPLANT
MANIFOLD NEPTUNE II (INSTRUMENTS) ×3 IMPLANT
NEEDLE HYPO 22GX1.5 SAFETY (NEEDLE) ×6 IMPLANT
NEEDLE MA TROC 1/2 CIR (NEEDLE) ×3 IMPLANT
NEEDLE MAYO 6 CRC TAPER PT (NEEDLE) ×3 IMPLANT
NEEDLE MAYO CATGUT SZ4 (NEEDLE) IMPLANT
PACK TOTAL JOINT (CUSTOM PROCEDURE TRAY) ×3 IMPLANT
PADDING CAST COTTON 6X4 STRL (CAST SUPPLIES) IMPLANT
PASSER SUT SWANSON 36MM LOOP (INSTRUMENTS) IMPLANT
PENCIL SMOKE EVACUATOR (MISCELLANEOUS) IMPLANT
PROTECTOR NERVE ULNAR (MISCELLANEOUS) ×3 IMPLANT
STAPLER VISISTAT (STAPLE) ×3 IMPLANT
SUCTION FRAZIER HANDLE 10FR (MISCELLANEOUS) ×3
SUCTION FRAZIER HANDLE 12FR (TUBING) ×3
SUCTION TUBE FRAZIER 10FR DISP (MISCELLANEOUS) ×1 IMPLANT
SUCTION TUBE FRAZIER 12FR DISP (TUBING) ×1 IMPLANT
SUT ETHIBOND 5 LR DA (SUTURE) IMPLANT
SUT ETHIBOND NAB CT1 #1 30IN (SUTURE) IMPLANT
SUT FIBERWIRE #2 38 T-5 BLUE (SUTURE) ×6
SUT VIC AB 0 CT1 27 (SUTURE)
SUT VIC AB 0 CT1 27XBRD ANTBC (SUTURE) IMPLANT
SUT VIC AB 1 CT1 27 (SUTURE) ×6
SUT VIC AB 1 CT1 27XBRD ANTBC (SUTURE) ×2 IMPLANT
SUT VIC AB 2-0 CT1 27 (SUTURE) ×6
SUT VIC AB 2-0 CT1 TAPERPNT 27 (SUTURE) ×2 IMPLANT
SUTURE FIBERWR #2 38 T-5 BLUE (SUTURE) ×2 IMPLANT
SUTURE TAPE 1.3 40 TPR END (SUTURE) ×3 IMPLANT
SUTURETAPE 1.3 40 TPR END (SUTURE) ×9
SYR 20ML LL LF (SYRINGE) ×6 IMPLANT
TOWEL OR 17X26 10 PK STRL BLUE (TOWEL DISPOSABLE) ×3 IMPLANT
TOWEL OR NON WOVEN STRL DISP B (DISPOSABLE) IMPLANT
WIPE CHG CHLORHEXIDINE 2% (PERSONAL CARE ITEMS) ×3 IMPLANT

## 2020-05-13 NOTE — Anesthesia Postprocedure Evaluation (Signed)
Anesthesia Post Note  Patient: Ian [name]  Procedure(s) Performed: REPAIR QUADRICEP TENDON (Left Leg Upper)     Patient location during evaluation: PACU Anesthesia Type: General Level of consciousness: awake and alert Pain management: pain level controlled Vital Signs Assessment: post-procedure vital signs reviewed and stable Respiratory status: spontaneous breathing, nonlabored ventilation and respiratory function stable Cardiovascular status: blood pressure returned to baseline and stable Postop Assessment: no apparent nausea or vomiting Anesthetic complications: no    Last Vitals:  Vitals:   05/13/20 1145 05/13/20 1200  BP:  (!) 150/85  Pulse: 87 92  Resp: 20 20  Temp: 36.6 C   SpO2: 95% 97%    Last Pain:  Vitals:   05/13/20 1145  TempSrc:   PainSc: [place]

## 2020-05-13 NOTE — Progress Notes (Signed)
Pharmacy Antibiotic Note  Ian [name] is a 59 y.o. male admitted on 05/13/2020.  Pharmacy has been consulted for vancomycin dosing for surgical prophylaxis x 24 hours.  Vancomycin 1500 mg given preop at 06 am.   Plan: Vancomycin 1 gm IV q12h x 3 doses to cover x 24 hours post op. Pharmacy to sign off    Temp (24hrs), Avg:98.4 F (36.9 C), Min:97.8 F (36.6 C), Max:98.7 F (37.1 C)  Recent Labs  Lab 05/10/20 1035  WBC 5.3  CREATININE 1.06    Estimated Creatinine Clearance: 90.4 mL/min (by C-G formula based on SCr of 1.06 mg/dL).    Allergies  Allergen Reactions  . Penicillins Rash, Other (See Comments), Diarrhea and Hives    Has patient had a PCN reaction causing immediate rash, facial/tongue/throat swelling, SOB or lightheadedness with hypotension: Unknown Has patient had a PCN reaction causing severe rash involving mucus membranes or skin necrosis: No Has patient had a PCN reaction that required hospitalization: No Has patient had a PCN reaction occurring within the last 10 years: No If all of the above answers are "NO", then may proceed with Cephalosporin use.   . Tetanus Toxoid, Adsorbed Anaphylaxis  . Tetanus Toxoids Anaphylaxis  . Aspirin Nausea Only and Rash  . Daptomycin Other (See Comments)    myalgias  . Prednisone Other (See Comments) and Rash    Hyperactivity WITH ORAL, CAN TAKE SHOTS-Cannot do Dose pack  . Bioflavonoids Diarrhea    All citrus fruits  . Other     Cheese  . Tetanus Immune Globulin   . Citrus Diarrhea and Other (See Comments)    All citrus fruits  . Fenofibrate Other (See Comments)    Constipation w/anal fissure [Sx] Other reaction(s): Constipation  . Milk-Related Compounds Diarrhea and Other (See Comments)    Milk and milk products  . Oxycodone Other (See Comments)    Does not help pain just makes him high    Thank you for allowing pharmacy to be a part of this patient's care.  Eudelia Bunch, Pharm.D 05/13/2020 4:16 PM

## 2020-05-13 NOTE — Discharge Instructions (Signed)
Elevate leg above heart 6x a day for 74minutes each Use knee immobilizer at all times to keep knee in extension Weight bear as tolerated on heel Aquacel dressing may remain in place until follow up. May shower with aquacel dressing in place. If the dressing becomes saturated or peels off, you may remove aquacel dressing. Do not remove steri-strips if they are present. Place new dressing with gauze and tape or ACE bandage which should be kept clean and dry and changed daily.  Information on my medicine - XARELTO (Rivaroxaban)  This medication education was reviewed with me or my healthcare representative as part of my discharge preparation.  The pharmacist that spoke with me during my hospital stay was:   Why was Xarelto prescribed for you? Xarelto was prescribed for you to reduce the risk of blood clots forming after orthopedic surgery. The medical term for these abnormal blood clots is venous thromboembolism (VTE).  What do you need to know about xarelto ? Take your Xarelto ONCE DAILY at the same time every day. You may take it either with or without food.  If you have difficulty swallowing the tablet whole, you may crush it and mix in applesauce just prior to taking your dose.  Take Xarelto exactly as prescribed by your doctor and DO NOT stop taking Xarelto without talking to the doctor who prescribed the medication.  Stopping without other VTE prevention medication to take the place of Xarelto may increase your risk of developing a clot.  After discharge, you should have regular check-up appointments with your healthcare provider that is prescribing your Xarelto.    What do you do if you miss a dose? If you miss a dose, take it as soon as you remember on the same day then continue your regularly scheduled once daily regimen the next day. Do not take two doses of Xarelto on the same day.   Important Safety Information A possible side effect of Xarelto is bleeding. You should  call your healthcare provider right away if you experience any of the following: ? Bleeding from an injury or your nose that does not stop. ? Unusual colored urine (red or dark brown) or unusual colored stools (red or black). ? Unusual bruising for unknown reasons. ? A serious fall or if you hit your head (even if there is no bleeding).  Some medicines may interact with Xarelto and might increase your risk of bleeding while on Xarelto. To help avoid this, consult your healthcare provider or pharmacist prior to using any new prescription or non-prescription medications, including herbals, vitamins, non-steroidal anti-inflammatory drugs (NSAIDs) and supplements.  This website has more information on Xarelto: [email or web address]

## 2020-05-13 NOTE — Interval H&P Note (Signed)
History and Physical Interval Note:  05/13/2020 7:05 AM  Ian [name]  has presented today for surgery, with the diagnosis of Left quad tear.  The various methods of treatment have been discussed with the patient and family. After consideration of risks, benefits and other options for treatment, the patient has consented to  Procedure(s): REPAIR QUADRICEP TENDON (Left) as a surgical intervention.  The patient's history has been reviewed, patient examined, no change in status, stable for surgery.  I have reviewed the patient's chart and labs.  Questions were answered to the patient's satisfaction.     Johnn Hai

## 2020-05-13 NOTE — Brief Op Note (Signed)
05/13/2020  9:51 AM  PATIENT:  Ian [name]  59 y.o. male  PRE-OPERATIVE DIAGNOSIS:  Left quad tear  POST-OPERATIVE DIAGNOSIS:  Left quad tear  PROCEDURE:  Procedure(s): REPAIR QUADRICEP TENDON (Left)  SURGEON:  Surgeon(s) and Role:    Susa Day, MD - Primary  PHYSICIAN ASSISTANT:   ASSISTANTS: [place]   ANESTHESIA:   general  EBL:  100 mL   BLOOD ADMINISTERED:none  DRAINS: none   LOCAL MEDICATIONS USED:  MARCAINE     SPECIMEN:  No Specimen  DISPOSITION OF SPECIMEN:  N/A  COUNTS:  YES  TOURNIQUET:  * No tourniquets in log *  DICTATION: .Other Dictation: Dictation Number [id number]  PLAN OF CARE: Admit for overnight observation  PATIENT DISPOSITION:  PACU - hemodynamically stable.   Delay start of Pharmacological VTE agent (>24hrs) due to surgical blood loss or risk of bleeding: no

## 2020-05-13 NOTE — Transfer of Care (Signed)
Immediate Anesthesia Transfer of Care Note  Patient: Ian [name]  Procedure(s) Performed: Procedure(s): REPAIR QUADRICEP TENDON (Left)  Patient Location: PACU  Anesthesia Type:General  Level of Consciousness:  sedated, patient cooperative and responds to stimulation  Airway & Oxygen Therapy:Patient Spontanous Breathing and Patient connected to face mask oxgen  Post-op Assessment:  Report given to PACU RN and Post -op Vital signs reviewed and stable  Post vital signs:  Reviewed and stable  Last Vitals:  Vitals:   05/13/20 0600  BP: (!) 144/77  Pulse: 78  Resp: 17  Temp: 36.9 C  SpO2: [id number]    Complications: No apparent anesthesia complications

## 2020-05-13 NOTE — Anesthesia Procedure Notes (Signed)
Procedure Name: Intubation Date/Time: 05/13/2020 7:48 AM Performed by: Lavina Hamman, CRNA Pre-anesthesia Checklist: Patient identified, Emergency Drugs available, Suction available, Patient being monitored and Timeout performed Patient Re-evaluated:Patient Re-evaluated prior to induction Oxygen Delivery Method: Circle system utilized Preoxygenation: Pre-oxygenation with 100% oxygen Induction Type: IV induction Ventilation: Mask ventilation without difficulty Laryngoscope Size: Mac and 4 Grade View: Grade I Tube type: Oral Tube size: 7.5 mm Number of attempts: 1 Airway Equipment and Method: Stylet Placement Confirmation: ETT inserted through vocal cords under direct vision,  positive ETCO2,  CO2 detector and breath sounds checked- equal and bilateral Secured at: 22 cm Tube secured with: Tape Dental Injury: Teeth and Oropharynx as per pre-operative assessment  Comments: ATOI

## 2020-05-14 ENCOUNTER — Other Ambulatory Visit: Payer: Self-pay

## 2020-05-14 LAB — CBC WITH DIFFERENTIAL/PLATELET
Abs Immature Granulocytes: 0.04 10*3/uL (ref 0.00–0.07)
Basophils Absolute: 0 10*3/uL (ref 0.0–0.1)
Basophils Relative: 0 %
Eosinophils Absolute: 0 10*3/uL (ref 0.0–0.5)
Eosinophils Relative: 0 %
HCT: 38.2 % — ABNORMAL LOW (ref 39.0–52.0)
Hemoglobin: 13.2 g/dL (ref 13.0–17.0)
Immature Granulocytes: 0 %
Lymphocytes Relative: 14 %
Lymphs Abs: 1.3 10*3/uL (ref 0.7–4.0)
MCH: 32.4 pg (ref 26.0–34.0)
MCHC: 34.6 g/dL (ref 30.0–36.0)
MCV: 93.9 fL (ref 80.0–100.0)
Monocytes Absolute: 1.2 10*3/uL — ABNORMAL HIGH (ref 0.1–1.0)
Monocytes Relative: 13 %
Neutro Abs: 7 10*3/uL (ref 1.7–7.7)
Neutrophils Relative %: 73 %
Platelets: 141 10*3/uL — ABNORMAL LOW (ref 150–400)
RBC: 4.07 MIL/uL — ABNORMAL LOW (ref 4.22–5.81)
RDW: 13.1 % (ref 11.5–15.5)
WBC: 9.6 10*3/uL (ref 4.0–10.5)
nRBC: 0 % (ref 0.0–0.2)

## 2020-05-14 LAB — BASIC METABOLIC PANEL
Anion gap: 5 (ref 5–15)
BUN: 16 mg/dL (ref 6–20)
CO2: 28 mmol/L (ref 22–32)
Calcium: 8.6 mg/dL — ABNORMAL LOW (ref 8.9–10.3)
Chloride: 103 mmol/L (ref 98–111)
Creatinine, Ser: 1 mg/dL (ref 0.61–1.24)
GFR calc Af Amer: >60 mL/min (ref >60)
GFR calc non Af Amer: >60 mL/min (ref >60)
Glucose, Bld: 170 mg/dL — ABNORMAL HIGH (ref 70–99)
Potassium: 3.8 mmol/L (ref 3.5–5.1)
Sodium: 136 mmol/L (ref 135–145)

## 2020-05-14 LAB — GLUCOSE, CAPILLARY: Glucose-Capillary: 139 mg/dL — ABNORMAL HIGH (ref 70–99)

## 2020-05-14 MED ORDER — METHOCARBAMOL 500 MG PO TABS: 500.0000 mg | ORAL_TABLET | Freq: Four times a day (QID) | ORAL | 1 refills | Status: DC | PRN

## 2020-05-14 MED ORDER — DOCUSATE SODIUM 100 MG PO CAPS: 100.0000 mg | ORAL_CAPSULE | Freq: Two times a day (BID) | ORAL | 0 refills | Status: DC | PRN

## 2020-05-14 MED ORDER — HYDROCODONE-ACETAMINOPHEN 5-325 MG PO TABS: 1.0000 | ORAL_TABLET | ORAL | 0 refills | Status: DC | PRN

## 2020-05-14 MED ORDER — RIVAROXABAN 20 MG PO TABS
10.0000 mg | ORAL_TABLET | Freq: Every day | ORAL | Status: DC
Start: 2020-05-14 — End: 2021-08-15

## 2020-05-14 NOTE — Progress Notes (Signed)
Pt and pt's Wife provided with d/c instructions. After discussing the pt's plan of care upon d/c home, the pt and pt's Wife denied any further questions or concerns.

## 2020-05-14 NOTE — Evaluation (Signed)
Occupational Therapy Evaluation Patient Details Name: Ian [name] MRN: [id number] DOB: 1961/10/30 Today's Date: 05/14/2020    History of Present Illness Pt s/p L quad tendon repair and with hx of back and L shoulder surgery.     Clinical Impression   Patient presents with decreased strength and ROM and knee immobilized into extension s/p left quad tendon repair. On evaluation patient demonstrates good upper body strength and balance to compensate for lower extremity deficits in order to ambulation with RW and perform sit to stands. Patient requiring assistance for lower body ADLs and has assistance of spouse at home. Discussed home safety and use of DME including BSC, elevated toilet seat and shower chair - that they report having and using. Discussed use of shoe horn and long handled reacher to compensate for lower extremity deficits. They verbalize understanding. No further OT needs at this time.    Follow Up Recommendations  No OT follow up    Equipment Recommendations       Recommendations for Other Services       Precautions / Restrictions Precautions Precautions: Fall Required Braces or Orthoses: Knee Immobilizer - Left Knee Immobilizer - Left: On when out of bed or walking Restrictions Weight Bearing Restrictions: Yes RLE Weight Bearing: Weight bearing as tolerated Other Position/Activity Restrictions: WBAT on heel with KI in place      Mobility Bed Mobility Overal bed mobility: Needs Assistance Bed Mobility: Supine to Sit     Supine to sit: Min guard     General bed mobility comments: cues for sequence; min guard for R LE  Transfers Overall transfer level: Needs assistance Equipment used: Rolling walker (2 wheeled) Transfers: Sit to/from Stand Sit to Stand: Min guard;Supervision         General transfer comment: Patient seated in recliner when therapist entered the room. patinet demonstrated ability to perform sit to stand, ambulate in room, perform  toilet transfer with RW and therapist providing SBA.    Balance Overall balance assessment: No apparent balance deficits (not formally assessed)                                         ADL either performed or assessed with clinical judgement   ADL Overall ADL's : Needs assistance/impaired Eating/Feeding: Independent   Grooming: Independent   Upper Body Bathing: Independent   Lower Body Bathing: Moderate assistance   Upper Body Dressing : Set up   Lower Body Dressing: Moderate assistance;Sit to/from stand Lower Body Dressing Details (indicate cue type and reason): needed assistance to donn clothing over left foot and doff left sock and donn left shoe. Patinet able to stand and pull clothing up from knees. Discussed AE such as shoe horn and long handled reacher for assiting with dressing with patient and spouse. report they have both at home. Toilet Transfer: Modified Publishing rights manager Details (indicate cue type and reason): Patinet has a BSC and elevated toilet seat at home. Discussed appropriate use of both at home. Toileting- Clothing Manipulation and Hygiene: Furniture conservator/restorer Details (indicate cue type and reason): Patient and spouse reports having shower chair at hoem to use for safety. Functional mobility during ADLs: Supervision/safety;Rolling walker       Vision   Vision Assessment?: No apparent visual deficits     Perception     Praxis      Pertinent Vitals/Pain Pain  Assessment: 0-10 Pain Score: 2  Pain Location: R knee Pain Descriptors / Indicators: Aching;Sore Pain Intervention(s): Limited activity within patient's tolerance;Monitored during session;Premedicated before session;Ice applied     Hand Dominance Right   Extremity/Trunk Assessment Upper Extremity Assessment Upper Extremity Assessment: Overall WFL for tasks assessed   Lower Extremity Assessment Lower Extremity Assessment: Defer to PT  evaluation RLE Deficits / Details: KI in place - NO ROM R knee   Cervical / Trunk Assessment Cervical / Trunk Assessment: Normal   Communication Communication Communication: No difficulties   Cognition Arousal/Alertness: Awake/alert Behavior During Therapy: WFL for tasks assessed/performed Overall Cognitive Status: Within Functional Limits for tasks assessed                                     General Comments       Exercises Exercises: General Lower Extremity General Exercises - Lower Extremity Ankle Circles/Pumps: AROM;Both;15 reps;Supine   Shoulder Instructions      Home Living Family/patient expects to be discharged to:: Private residence Living Arrangements: Spouse/significant other Available Help at Discharge: Family Type of Home: House Home Access: Stairs to enter Technical brewer of Steps: 4+1 Entrance Stairs-Rails: Left Home Layout: One level     Bathroom Shower/Tub: Occupational psychologist: [place]: Environmental consultant - 2 wheels;Cane - single point;Crutches          Prior Functioning/Environment Level of Independence: Independent                 OT Problem List:        OT Treatment/Interventions:      OT Goals(Current goals can be found in the care plan section) Acute Rehab OT Goals Patient Stated Goal: Regain IND and not reinjure  OT Goal Formulation: All assessment and education complete, DC therapy  OT Frequency:     Barriers to D/C:            Co-evaluation              AM-PAC OT "6 Clicks" Daily Activity     Outcome Measure Help from another person eating meals?: None Help from another person taking care of personal grooming?: None Help from another person toileting, which includes using toliet, bedpan, or urinal?: None Help from another person bathing (including washing, rinsing, drying)?: A Little Help from another person to put on and taking off regular upper body clothing?: A  Little Help from another person to put on and taking off regular lower body clothing?: A Lot 6 Click Score: 20   End of Session Equipment Utilized During Treatment: Gait belt;Rolling walker Nurse Communication: Other (comment)(OT completed evaluation.)  Activity Tolerance: Patient tolerated treatment well Patient left: in chair;with call bell/phone within reach;with family/visitor present  OT Visit Diagnosis: Other abnormalities of gait and mobility (R26.89)                Time: [id number] OT Time Calculation (min): 17 min Charges:  OT General Charges $OT Visit: 1 Visit OT Evaluation $OT Eval Low Complexity: 1 Low  Zidan Helget, OTR/L Acute Care Rehab Services  Office [phone]   Lenward Chancellor 05/14/2020, 12:27 PM

## 2020-05-14 NOTE — Discharge Summary (Signed)
Patient ID: Ian [name] MRN: [id number] DOB/AGE: 1961-04-09 59 y.o.  Admit date: 05/13/2020 Discharge date: 05/14/2020  Admission Diagnoses:  Active Problems:   Tendon tear   Discharge Diagnoses:  Same  Past Medical History:  Diagnosis Date  . Allergy   . Arthritis   . Biceps tendon rupture    bilateral  . Clostridium difficile infection 2014  . Colon polyps   . Diabetes ([place])    Borderline/Pre-diabetes  . GERD (gastroesophageal reflux disease)   . History of DVT (deep vein thrombosis)   . History of pulmonary embolism   . Hyperlipidemia   . Hypertension   . Internal bleeding hemorrhoids 2012   "might bleed once/month; only when I strain"  . Internal prolapsed hemorrhoids   . Leg cramps   . Neuromuscular disorder ([facility])    leg cramps   . Testicular hypofunction   . Wart of face    Right cheek    Surgeries: Procedure(s): REPAIR QUADRICEP TENDON on 05/13/2020   Consultants:   Discharged Condition: Improved  Hospital Course: Ian [name] is an 59 y.o. male who was admitted 05/13/2020 for operative treatment of<principal problem not specified>. Patient has severe unremitting pain that affects sleep, daily activities, and work/hobbies. After pre-op clearance the patient was taken to the operating room on 05/13/2020 and underwent  Procedure(s): REPAIR QUADRICEP TENDON.    Patient was given perioperative antibiotics:  Anti-infectives (From admission, onward)   Start     Dose/Rate Route Frequency Ordered Stop   05/13/20 1800  vancomycin (VANCOCIN) IVPB 1000 mg/200 mL premix     1,000 mg 200 mL/hr over 60 Minutes Intravenous Every 12 hours 05/13/20 1614 05/15/20 0559   05/13/20 0756  polymyxin B 500,000 Units, bacitracin 50,000 Units in sodium chloride 0.9 % 500 mL irrigation  Status:  Discontinued       As needed 05/13/20 0757 05/13/20 1257   05/13/20 0645  polymyxin B 500,000 Units, bacitracin 50,000 Units in sodium chloride 0.9 % 500 mL irrigation  Status:   Discontinued      Irrigation  Once 05/13/20 0637 05/13/20 1257   05/13/20 0600  vancomycin (VANCOREADY) IVPB 1500 mg/300 mL     1,500 mg 150 mL/hr over 120 Minutes Intravenous On call to O.R. 05/12/20 6213 05/13/20 0757       Patient was given sequential compression devices, early ambulation, and chemoprophylaxis to prevent DVT.  Patient benefited maximally from hospital stay and there were no complications.    Recent vital signs:  Patient Vitals for the past 24 hrs:  BP Temp Temp src Pulse Resp SpO2  05/14/20 0945 116/75 97.6 F (36.4 C) Oral 67 16 99 %  05/14/20 0519 104/70 97.6 F (36.4 C) Oral (!) 56 18 100 %  05/14/20 0247 112/68 97.9 F (36.6 C) Oral 73 18 99 %  05/13/20 2131 117/76 98.4 F (36.9 C) Oral 87 18 99 %  05/13/20 1633 (!) 148/72 98.2 F (36.8 C) Oral 84 16 97 %  05/13/20 1526 132/84 98.2 F (36.8 C) Oral 92 16 99 %  05/13/20 1432 140/86 98.2 F (36.8 C) Oral 89 16 98 %  05/13/20 1337 (!) 154/80 98.4 F (36.9 C) Oral 91 -- 96 %  05/13/20 1300 (!) 147/94 98.6 F (37 C) -- 90 15 98 %  05/13/20 1200 (!) 150/85 -- -- 92 20 97 %  05/13/20 1145 -- 97.8 F (36.6 C) -- 87 20 95 %  05/13/20 1100 (!) 146/94 98.7 F (37.1 C) --  83 16 98 %  05/13/20 1045 (!) 150/95 -- -- 89 16 93 %  05/13/20 1030 (!) 148/98 -- -- 92 (!) 24 98 %     Recent laboratory studies:  Recent Labs    05/14/20 0514  WBC 9.6  HGB 13.2  HCT 38.2*  PLT 141*  NA 136  K 3.8  CL 103  CO2 28  BUN 16  CREATININE 1.00  GLUCOSE 170*  CALCIUM 8.6*     Discharge Medications:   Allergies as of 05/14/2020      Reactions   Penicillins Rash, Other (See Comments), Diarrhea, Hives   Has patient had a PCN reaction causing immediate rash, facial/tongue/throat swelling, SOB or lightheadedness with hypotension: Unknown Has patient had a PCN reaction causing severe rash involving mucus membranes or skin necrosis: No Has patient had a PCN reaction that required hospitalization: No Has patient had  a PCN reaction occurring within the last 10 years: No If all of the above answers are "NO", then may proceed with Cephalosporin use.   Tetanus Toxoid, Adsorbed Anaphylaxis   Tetanus Toxoids Anaphylaxis   Aspirin Nausea Only, Rash   Daptomycin Other (See Comments)   myalgias   Prednisone Other (See Comments), Rash   Hyperactivity WITH ORAL, CAN TAKE SHOTS-Cannot do Dose pack   Bioflavonoids Diarrhea   All citrus fruits   Other    Cheese   Tetanus Immune Globulin    Citrus Diarrhea, Other (See Comments)   All citrus fruits   Fenofibrate Other (See Comments)   Constipation w/anal fissure [Sx] Other reaction(s): Constipation   Milk-related Compounds Diarrhea, Other (See Comments)   Milk and milk products   Oxycodone Other (See Comments)   Does not help pain just makes him high      Medication List    TAKE these medications   CINNAMON PO Take 2,400 mg by mouth daily.   docusate sodium 100 MG capsule Commonly known as: COLACE Take 1 capsule (100 mg total) by mouth 2 (two) times daily as needed for mild constipation.   [name] Generic drug: EPINEPHrine Inject 0.3 mg into the skin once.   fenofibrate 145 MG tablet Commonly known as: TRICOR TAKE ONE TABLET BY MOUTH DAILY   glyBURIDE 5 MG tablet Commonly known as: DIABETA TAKE ONE TABLET BY MOUTH TWICE A DAY WITH MEALS   hydrochlorothiazide 25 MG tablet Commonly known as: HYDRODIURIL Take 1 tablet (25 mg total) by mouth daily.   HYDROcodone-acetaminophen 5-325 MG tablet Commonly known as: NORCO/VICODIN Take 1 tablet by mouth every 4 (four) hours as needed for moderate pain.   Magnesium 400 MG Tabs Take 800 mg by mouth daily.   methocarbamol 500 MG tablet Commonly known as: Robaxin Take 1 tablet (500 mg total) by mouth every 6 (six) hours as needed for muscle spasms.   omeprazole 20 MG capsule Commonly known as: PRILOSEC Take 1 capsule (20 mg total) by mouth daily.    pioglitazone 30 MG tablet Commonly known as: ACTOS Take 1 tablet (30 mg total) by mouth daily.   RA Potassium Gluconate 595 (99 K) MG Tabs tablet Generic drug: potassium gluconate Take 595 mg by mouth daily.   [name] USE AS DIRECTED TO TEST BLOOD GLUCOSE DAILY DX: 250.00   rivaroxaban 20 MG Tabs tablet Commonly known as: Xarelto TAKE 1 TABLET (20 MG TOTAL) BY MOUTH DAILY WITH SUPPER. What changed: Another medication with the same name was changed. Make  sure you understand how and when to take each.   rivaroxaban 20 MG Tabs tablet Commonly known as: XARELTO Take 1 tablet (20 mg total) by mouth daily with supper. What changed: how much to take   rosuvastatin 20 MG tablet Commonly known as: CRESTOR Take 1 tablet (20 mg total) by mouth daily. What changed: how much to take   Vitamin D-3 125 MCG (5000 UT) Tabs Take 5,000 Units by mouth daily.       Diagnostic Studies: No results found.  Disposition: Discharge disposition: 01-Home or Self Care       Discharge Instructions    Call MD / Call 911   Complete by: As directed    If you experience chest pain or shortness of breath, CALL 911 and be transported to the hospital emergency room.  If you develope a fever above 101 F, pus (white drainage) or increased drainage or redness at the wound, or calf pain, call your surgeon's office.   Constipation Prevention   Complete by: As directed    Drink plenty of fluids.  Prune juice may be helpful.  You may use a stool softener, such as Colace (over the counter) 100 mg twice a day.  Use MiraLax (over the counter) for constipation as needed.   Diet - low sodium heart healthy   Complete by: As directed    Increase activity slowly as tolerated   Complete by: As directed       Follow-up Information    Susa Day, MD Follow up in 2 week(s).   Specialty: Orthopedic Surgery Contact information: [address] [place]  [address] [phone]            Signed: Cecilie Kicks 05/14/2020, 10:15 AM

## 2020-05-14 NOTE — Progress Notes (Signed)
Subjective: 1 Day Post-Op Procedure(s) (LRB): REPAIR QUADRICEP TENDON (Left) [name]; [name]; [name]; [name]; I.V.:117.2] Out: 600 [Urine:600]  Recent Labs    05/14/20 0514  HGB 13.2   Recent Labs    05/14/20 0514  WBC 9.6  RBC 4.07*  HCT 38.2*  PLT 141*   Recent Labs    05/14/20 0514  NA 136  K 3.8  CL 103  CO2 28  BUN 16  CREATININE 1.00  GLUCOSE 170*  CALCIUM 8.6*   No results for input(s): LABPT, INR in the last 72 hours.  Neurologically intact ABD soft Neurovascular intact Sensation intact distally Intact pulses distally Dorsiflexion/Plantar flexion intact Incision: dressing C/D/I and no drainage No cellulitis present Compartment soft no calf pain or sign of DVT   Assessment/Plan: 1 Day Post-Op Procedure(s) (LRB): REPAIR QUADRICEP TENDON (Left) Advance diet Up with therapy D/C IV fluids Plan D/C today May WBAT on heel in immobilizer Follow up 10-14 days post-op with Dr Ian [name] Ian [name] 05/14/2020, 10:10 AM

## 2020-05-14 NOTE — Plan of Care (Signed)

## 2020-05-14 NOTE — Evaluation (Signed)
Physical Therapy Evaluation Patient Details Name: Ian [name] MRN: [id number] DOB: Jul 18, 1961 Today's Date: 05/14/2020   History of Present Illness  Pt s/p L quad tendon repair and with hx of back and L shoulder surgery.    Clinical Impression  Pt s/p L quad tendon repair and presents with functional mobility limitations 2* decreased R LE strength, NO ROM allowed at R knee and post op pain.  Pt currently mobilizing at min guard/sup level with RW and up/down stairs with rail/crutch and eager for return home.    Follow Up Recommendations No PT follow up    Equipment Recommendations  None recommended by PT    Recommendations for Other Services       Precautions / Restrictions Precautions Precautions: Fall Required Braces or Orthoses: Knee Immobilizer - Left Knee Immobilizer - Left: On when out of bed or walking Restrictions Weight Bearing Restrictions: Yes RLE Weight Bearing: Weight bearing as tolerated Other Position/Activity Restrictions: WBAT on heel with KI in place      Mobility  Bed Mobility Overal bed mobility: Needs Assistance Bed Mobility: Supine to Sit     Supine to sit: Min guard     General bed mobility comments: cues for sequence; min guard for R LE  Transfers Overall transfer level: Needs assistance Equipment used: Rolling walker (2 wheeled) Transfers: Sit to/from Stand Sit to Stand: Min guard;Supervision         General transfer comment: cues for LE management and use of UEs to self assist  Ambulation/Gait Ambulation/Gait assistance: Min guard;Supervision Gait Distance (Feet): 120 Feet Assistive device: Rolling walker (2 wheeled) Gait Pattern/deviations: Step-to pattern;Decreased step length - right;Decreased step length - left;Shuffle;Trunk flexed Gait velocity: decr   General Gait Details: cues for sequence, posture and position from RW  Stairs Stairs: Yes Stairs assistance: Min assist Stair Management: No rails;One rail Left;Step to  pattern;Forwards;With walker;With crutches Number of Stairs: 6 General stair comments: single step fwd with RW; five steps fwd with rail and crutch; cues for sequence and foot/crutch placement  Wheelchair Mobility    Modified Rankin (Stroke Patients Only)       Balance Overall balance assessment: Mild deficits observed, not formally tested                                           Pertinent Vitals/Pain Pain Assessment: 0-10 Pain Score: 2  Pain Location: R knee Pain Descriptors / Indicators: Aching;Sore Pain Intervention(s): Limited activity within patient's tolerance;Monitored during session;Premedicated before session;Ice applied    Home Living Family/patient expects to be discharged to:: Private residence Living Arrangements: Spouse/significant other Available Help at Discharge: Family Type of Home: House Home Access: Stairs to enter Entrance Stairs-Rails: Left Entrance Stairs-Number of Steps: 4+1 Home Layout: One level Home Equipment: Environmental consultant - 2 wheels;Cane - single point;Crutches      Prior Function Level of Independence: Independent               Hand Dominance        Extremity/Trunk Assessment   Upper Extremity Assessment Upper Extremity Assessment: Overall WFL for tasks assessed    Lower Extremity Assessment Lower Extremity Assessment: RLE deficits/detail RLE Deficits / Details: KI in place - NO ROM R knee    Cervical / Trunk Assessment Cervical / Trunk Assessment: Normal  Communication   Communication: No difficulties  Cognition Arousal/Alertness: Awake/alert Behavior During Therapy: WFL for  tasks assessed/performed Overall Cognitive Status: Within Functional Limits for tasks assessed                                        General Comments      Exercises General Exercises - Lower Extremity Ankle Circles/Pumps: AROM;Both;15 reps;Supine   Assessment/Plan    PT Assessment Patient needs continued PT  services  PT Problem List Decreased strength;Decreased range of motion;Decreased activity tolerance;Decreased mobility;Decreased knowledge of use of DME;Obesity;Pain       PT Treatment Interventions DME instruction;Gait training;Stair training;Functional mobility training;Therapeutic activities;Therapeutic exercise;Patient/family education    PT Goals (Current goals can be found in the Care Plan section)  Acute Rehab PT Goals Patient Stated Goal: Regain IND and not reinjure  PT Goal Formulation: All assessment and education complete, DC therapy    Frequency Min 1X/week   Barriers to discharge        Co-evaluation               AM-PAC PT "6 Clicks" Mobility  Outcome Measure Help needed turning from your back to your side while in a flat bed without using bedrails?: A Little Help needed moving from lying on your back to sitting on the side of a flat bed without using bedrails?: A Little Help needed moving to and from a bed to a chair (including a wheelchair)?: A Little Help needed standing up from a chair using your arms (e.g., wheelchair or bedside chair)?: A Little Help needed to walk in hospital room?: A Little Help needed climbing 3-5 steps with a railing? : A Little 6 Click Score: 18    End of Session Equipment Utilized During Treatment: Gait belt;Right knee immobilizer Activity Tolerance: Patient tolerated treatment well Patient left: in chair;with call bell/phone within reach;with chair alarm set Nurse Communication: Mobility status PT Visit Diagnosis: Difficulty in walking, not elsewhere classified (R26.2)    Time: [id number] PT Time Calculation (min) (ACUTE ONLY): 53 min   Charges:   PT Evaluation $PT Eval Low Complexity: 1 Low PT Treatments $Gait Training: 8-22 mins        Malin Pager [phone] Office [phone]   Dezyre Hoefer 05/14/2020, 12:04 PM

## 2020-05-15 ENCOUNTER — Other Ambulatory Visit: Payer: Self-pay | Admitting: Family Medicine

## 2020-05-15 NOTE — Op Note (Signed)
NAME: Ian [name], ZEEDYK MEDICAL RECORD [id number] ACCOUNT [id number] DATE OF BIRTH:1961-12-12 FACILITY: [facility] LOCATION: [facility] PHYSICIAN:Toniann Dickerson Windy Kalata, MD  OPERATIVE REPORT  DATE OF PROCEDURE:  05/13/2020  PREOPERATIVE DIAGNOSES:  Quadriceps tendon tear of the left knee.  POSTOPERATIVE DIAGNOSES: 1.  Quadriceps tendon tear of the left knee.  2.  Left knee osteoarthritis. 3.  Knee effusion.  PROCEDURE PERFORMED: 1.  Repair of left quadriceps tendon utilizing SwiveLock suture anchors and FiberTape suture. 2.  Lavage of the knee joint.  ANESTHESIA:  General.  ASSISTANT:  Lacie Draft, PA  HISTORY:  A 59 year old male who stepped in a hole about 5 weeks ago, hurt his knee.  He had swelling and pain.  Over next, 2 weeks he had persistent symptoms.  He underwent an MRI despite he was able to perform an active straight leg raise, indicating a  partial high-grade tear of his quadriceps tendon, joint effusion and some degenerative changes.  He was indicated for repair of the tendon.  Risks and benefits discussed including bleeding, infection, damage to neurovascular structures, no change in  symptoms, worsening symptoms, DVT, PE, anesthetic complications, etc.  TECHNIQUE:  With the patient in supine position, after induction of adequate general anesthesia and 1.5 grams of vancomycin, the left lower extremity was prepped and draped in the usual sterile fashion.  An incision was made from the mid portion of the  patella proximally 10 cm.  Subcutaneous tissue was dissected.  Electrocautery was utilized to achieve hemostasis.  We infiltrated the subcutaneous tissue with 0.25% Marcaine with epinephrine.  Incision was carried down to the prepatellar bursa, the  retinaculum, over the quadriceps tendon.  The defect was palpated just proximal to the superior pole of the patella.  We incised the retinaculum.  We identified proximally the medial and lateral border of the quadriceps tendon.  After  evaluation of the  defect, this was in the quadriceps tendon, the distal portion was identified.  There was a tear noted there extending the medial and lateral width, moreso laterally than medially.  This is in fact, a partial tear.  We entered the joint and evacuated a  large effusion.  We then copiously irrigated the knee joint and flexed and extended it.  I then palpated the remaining portion of the tendon, which was in fact attached to the posterior portion of the tendon.  It was felt best that this would remain  intact.  Next, I used 2 separate FiberTape sutures.  Then, utilizing a Krakow stitch in the medial portion of the tendon and the lateral portion of the tendon, starting from the quadriceps tendon tear proximal, across and distal with 2 separate leaflets.   Next, we digitally mobilized the tendon.  Then, we took a Psychiatric nurse and prepared a bed in the superior pole of the patella anterior portion.  I then used a 4.7 drill and a drill guide, placed it in this portion of the patella, slightly angling the  distal and posterior approximately 25 mm.  The medial hole, I felt was not acceptable and I redrilled one slightly anterior to that.  Next for both of these drill holes, we palpated them with a guidewire.  There was good bone on all sides of the drill  hole and the end of the drill hole.  We then took the ends of the FiberTape and placed them into the large SwiveLock sutures.  We then used an Allis and advanced the tendon towards the patella and the patella towards the  tendon.  We inserted this through  the SwiveLock and then after tapping each of the pilot holes, we used a 4-7 SwiveLock, advancing them into the pilot hole.  Excellent purchase was obtained.  The lateral leaflet, the tendon was brought flush to the prepared bony surface.  The medial one  was approximately a half-centimeter from that.  I then backed out the medial SwiveLock and then utilized a third SwiveLock and placed it into  the SwiveLock and advanced it, after holding and cinching it down on the tendon and advanced it.  This opposed  the end of the tendon to the bony trough satisfactorily.  Following that, I used the rescue sutures that were attached to that and threaded them with a free needle up through the tendon in the retinaculum, which was closed over top of the fixation site  and tied them with a surgical knot on each side.  We did use some  #1 Vicryl interrupted figure-of-eight sutures for fixing the anterior and posterior leaflets of the quadriceps tendon together prior to the retinaculum closure.  The remainder of the  retinaculum was closed with 1 Vicryl interrupted figure-of-eight sutures.  Prior to this, we irrigated multiple layers with antibiotic irrigation.  We had a watertight closure of the retinaculum.  I gently flexed the knee and extended it, flexed it to 30  degrees and extended it with maintenance of the repair.  Following this, copious irrigation.  Closed the subcutaneous with 2-0, skin with staples.  The wound was dressed sterilely, placed in immobilizer.  Extubated and transported to the recovery room  in satisfactory condition.  The patient tolerated the procedure well.  No complications.  Blood loss 100 mL.  PN/NUANCE  D:05/14/2020 T:05/15/2020 JOB:[id number]

## 2020-05-18 ENCOUNTER — Encounter: Payer: Self-pay | Admitting: *Deleted

## 2020-05-27 ENCOUNTER — Telehealth: Payer: Self-pay | Admitting: Family Medicine

## 2020-05-27 NOTE — Telephone Encounter (Signed)
Medication: omeprazole (PRILOSEC) 20 MG capsule    Has the patient contacted their pharmacy? No. (If no, request that the patient contact the pharmacy for the refill.) (If yes, when and what did the pharmacy advise?)  Preferred Pharmacy (with phone number or street name): [facility], [place]  [place], [place] [address]  Phone:  [phone] Fax:  [phone]  DEA #:  --  Agent: Please be advised that RX refills may take up to 3 business days. We ask that you follow-up with your pharmacy.

## 2020-05-28 MED ORDER — OMEPRAZOLE 20 MG PO CPDR: 20.0000 mg | DELAYED_RELEASE_CAPSULE | Freq: Every day | ORAL | 3 refills | Status: DC

## 2020-05-28 NOTE — Telephone Encounter (Signed)
Refill done.  

## 2020-07-31 ENCOUNTER — Other Ambulatory Visit: Payer: Self-pay | Admitting: Family Medicine

## 2020-08-09 ENCOUNTER — Ambulatory Visit: Payer: Self-pay | Admitting: Family Medicine

## 2020-08-10 ENCOUNTER — Encounter: Payer: Self-pay | Admitting: Family Medicine

## 2020-08-10 ENCOUNTER — Ambulatory Visit (INDEPENDENT_AMBULATORY_CARE_PROVIDER_SITE_OTHER): Payer: Self-pay | Admitting: Family Medicine

## 2020-08-10 ENCOUNTER — Other Ambulatory Visit: Payer: Self-pay

## 2020-08-10 VITALS — BP 132/84 | HR 94 | Temp 98.5°F | Ht 68.0 in | Wt 256.0 lb

## 2020-08-10 DIAGNOSIS — E669 Obesity, unspecified: Secondary | ICD-10-CM

## 2020-08-10 DIAGNOSIS — E1169 Type 2 diabetes mellitus with other specified complication: Secondary | ICD-10-CM

## 2020-08-10 LAB — HEMOGLOBIN A1C: Hgb A1c MFr Bld: 7.4 % — ABNORMAL HIGH (ref 4.6–6.5)

## 2020-08-10 NOTE — Progress Notes (Signed)
Subjective:   Chief Complaint  Patient presents with  . Follow-up    Ian [name] is a 59 y.o. male here for follow-up of diabetes.   Ian [name]'s self monitored glucose range is low 100's.  Patient denies hypoglycemic reactions. He checks his glucose levels 1 time per day. Patient does not require insulin.   Medications include: Actos 30 mg/d, glyburide 5 mg bid Diet is fair.  Exercise: None since knee surgery  Past Medical History:  Diagnosis Date  . Allergy   . Arthritis   . Biceps tendon rupture    bilateral  . Clostridium difficile infection 2014  . Colon polyps   . Diabetes ([place])    Borderline/Pre-diabetes  . GERD (gastroesophageal reflux disease)   . History of DVT (deep vein thrombosis)   . History of pulmonary embolism   . Hyperlipidemia   . Hypertension   . Internal bleeding hemorrhoids 2012   "might bleed once/month; only when I strain"  . Internal prolapsed hemorrhoids   . Leg cramps   . Neuromuscular disorder (HCC)    leg cramps   . Testicular hypofunction   . Wart of face    Right cheek     Related testing: Date of retinal exam: Due; having cost issues Pneumovax: done  Objective:  BP 132/84 (BP Location: Left Arm, Patient Position: Sitting, Cuff Size: Normal)   Pulse 94   Temp 98.5 F (36.9 C) (Oral)   Ht 5\' 8"  (1.727 m)   Wt 256 lb (116.1 kg)   SpO2 96%   BMI 38.92 kg/m  General:  Well developed, well nourished, in no apparent distress Skin:  Warm, no pallor or diaphoresis Head:  Normocephalic, atraumatic Eyes:  Pupils equal and round, sclera anicteric without injection  Lungs:  CTAB, no access msc use Cardio:  RRR, no bruits, no LE edema Musculoskeletal:  Symmetrical muscle groups noted without atrophy or deformity Neuro:  Sensation intact to pinprick on feet Psych: Age appropriate judgment and insight  Assessment:   Diabetes mellitus type 2 in obese (HCC) - Plan: Hemoglobin A1c   Plan:   Counseled on diet and exercise. Cont meds.   F/u in 6 mo. The patient voiced understanding and agreement to the plan.  [place], DO 08/10/20 12:21 PM

## 2020-08-10 NOTE — Patient Instructions (Signed)
Give us 2-3 business days to get the results of your labs back.   I recommend getting the flu shot in mid October. This suggestion would change if the CDC comes out with a different recommendation.   Keep the diet clean and stay active.  Let us know if you need anything.  

## 2020-08-16 ENCOUNTER — Other Ambulatory Visit: Payer: Self-pay | Admitting: Family Medicine

## 2020-08-16 DIAGNOSIS — E782 Mixed hyperlipidemia: Secondary | ICD-10-CM

## 2020-10-04 ENCOUNTER — Telehealth: Payer: Self-pay | Admitting: Family Medicine

## 2020-10-04 NOTE — Telephone Encounter (Signed)
Take Allegra or Zyrtec. If no improvement over the next week or so, I want to see him. Ty.

## 2020-10-04 NOTE — Telephone Encounter (Signed)
CallerKordae [name] Call Back # [phone]  Pt called back to speak to clinical staff please advise

## 2020-10-04 NOTE — Telephone Encounter (Signed)
Called the patient left message to call back 

## 2020-10-04 NOTE — Telephone Encounter (Signed)
Caller Ian [name] Call Back # [phone]  Patient states he has a sinus complaint, patient states nasal drainage and that Flonase is not working for him.  Please Advise

## 2020-10-04 NOTE — Telephone Encounter (Signed)
Called the patient informed of PCP instructions

## 2020-10-11 ENCOUNTER — Ambulatory Visit (INDEPENDENT_AMBULATORY_CARE_PROVIDER_SITE_OTHER): Payer: Self-pay | Admitting: Family Medicine

## 2020-10-11 ENCOUNTER — Encounter: Payer: Self-pay | Admitting: Family Medicine

## 2020-10-11 ENCOUNTER — Other Ambulatory Visit: Payer: Self-pay

## 2020-10-11 VITALS — BP 114/70 | HR 85 | Temp 98.3°F | Ht 68.0 in | Wt 251.4 lb

## 2020-10-11 DIAGNOSIS — Z23 Encounter for immunization: Secondary | ICD-10-CM

## 2020-10-11 DIAGNOSIS — K602 Anal fissure, unspecified: Secondary | ICD-10-CM

## 2020-10-11 DIAGNOSIS — J302 Other seasonal allergic rhinitis: Secondary | ICD-10-CM

## 2020-10-11 DIAGNOSIS — K635 Polyp of colon: Secondary | ICD-10-CM

## 2020-10-11 MED ORDER — HYDROCORTISONE 2.5 % EX CREA: TOPICAL_CREAM | CUTANEOUS | 0 refills | Status: DC

## 2020-10-11 MED ORDER — MONTELUKAST SODIUM 10 MG PO TABS: 10.0000 mg | ORAL_TABLET | Freq: Every day | ORAL | 3 refills | Status: DC

## 2020-10-11 NOTE — Patient Instructions (Addendum)
If you do not hear anything about your referral in the next 1-2 weeks, call our office and ask for an update.  Make sure you stay hydrated and keep your bowels moving normally.   Continue the Allegra.   Your lungs sound great today.   Let us know if you need anything.

## 2020-10-11 NOTE — Progress Notes (Signed)
Chief Complaint  Patient presents with  . Follow-up    Subjective: Patient is a 59 y.o. male here for f/u.  Pt is coughing with clear sputum. Allegra did help, but is still bothersome. Does not do well with INCS.  He has never been on Singulair.  He is having some drainage but no itchy or watery eyes.  No shortness of breath or wheezing.  Last week, the patient had both nosebleed and bright red blood per rectum.  Due to lack of insurance, he has not followed up with the gastroenterology team for his history of polyps.  He was due last year.  He would like to see them this January.  He is on Xarelto chronically and reports compliance.  No injury to his bottom.  He has been straining as of last week.   Past Medical History:  Diagnosis Date  . Allergy   . Arthritis   . Biceps tendon rupture    bilateral  . Clostridium difficile infection 2014  . Colon polyps   . Diabetes ([place])    Borderline/Pre-diabetes  . GERD (gastroesophageal reflux disease)   . History of DVT (deep vein thrombosis)   . History of pulmonary embolism   . Hyperlipidemia   . Hypertension   . Internal bleeding hemorrhoids 2012   "might bleed once/month; only when I strain"  . Internal prolapsed hemorrhoids   . Leg cramps   . Neuromuscular disorder (HCC)    leg cramps   . Testicular hypofunction   . Wart of face    Right cheek    Objective: BP 114/70 (BP Location: Left Arm, Patient Position: Sitting, Cuff Size: Normal)   Pulse 85   Temp 98.3 F (36.8 C) (Oral)   Ht 5\' 8"  (1.727 m)   Wt 251 lb 6 oz (114 kg)   SpO2 96%   BMI 38.22 kg/m  General: Awake, appears stated age Rectum: There is a small tear that is superficial over the anterior left side of the anus.  There are no other lesions or hemorrhoids noted. Heart: RRR Lungs: CTAB, no rales, wheezes or rhonchi. No accessory muscle use Psych: Age appropriate judgment and insight, normal affect and mood  Assessment and Plan: Polyp of colon, unspecified  part of colon, unspecified type - Plan: Ambulatory referral to Gastroenterology  Anal fissure - Plan: hydrocortisone 2.5 % cream  Seasonal allergies - Plan: montelukast (SINGULAIR) 10 MG tablet  Need for influenza vaccination - Plan: Flu Vaccine QUAD 6+ mos PF IM (Fluarix Quad PF)  1.  Refer to gastroenterology 2.  Cortisone cream, stay hydrated, and sure bowels are moving normally. 3.  Continue Allegra, add Singulair. Follow-up as originally scheduled. The patient voiced understanding and agreement to the plan.  [place], DO 10/11/20  12:01 PM

## 2020-10-21 NOTE — Telephone Encounter (Signed)
Patient scheduled for tomorrow

## 2020-10-22 ENCOUNTER — Ambulatory Visit (INDEPENDENT_AMBULATORY_CARE_PROVIDER_SITE_OTHER): Payer: Self-pay | Admitting: Family Medicine

## 2020-10-22 ENCOUNTER — Other Ambulatory Visit: Payer: Self-pay

## 2020-10-22 ENCOUNTER — Encounter: Payer: Self-pay | Admitting: Family Medicine

## 2020-10-22 VITALS — BP 120/68 | HR 88 | Temp 98.9°F | Ht 68.0 in | Wt 260.1 lb

## 2020-10-22 DIAGNOSIS — R058 Other specified cough: Secondary | ICD-10-CM

## 2020-10-22 DIAGNOSIS — K219 Gastro-esophageal reflux disease without esophagitis: Secondary | ICD-10-CM

## 2020-10-22 MED ORDER — PANTOPRAZOLE SODIUM 40 MG PO TBEC: 40.0000 mg | DELAYED_RELEASE_TABLET | Freq: Every day | ORAL | 3 refills | Status: DC

## 2020-10-22 MED ORDER — METHYLPREDNISOLONE ACETATE 80 MG/ML IJ SUSP
80.0000 mg | Freq: Once | INTRAMUSCULAR | Status: AC
Start: 2020-10-22 — End: 2020-10-22
  Administered 2020-10-22: 80 mg via INTRAMUSCULAR

## 2020-10-22 MED ORDER — MOMETASONE FUROATE 50 MCG/ACT NA SUSP: 2.0000 | Freq: Every day | NASAL | 12 refills | Status: DC

## 2020-10-22 MED ORDER — MUPIROCIN 2 % EX OINT: 1.0000 "application " | TOPICAL_OINTMENT | Freq: Two times a day (BID) | CUTANEOUS | 0 refills | Status: DC

## 2020-10-22 NOTE — Patient Instructions (Addendum)
Let me know if there are cost issues.  I will see if we have a pharm rep we can reach out to regarding an inhaler.  Reach out to me in a month if we aren't turning the corner.  Please limit the Afrin as much as possible.   Let us know if you need anything.

## 2020-10-22 NOTE — Progress Notes (Signed)
Chief Complaint  Patient presents with   Follow-up    cough    Subjective: Patient is a 59 y.o. male here for f/u chronic cough.  He is here with his wife.  The patient was seen on 10/18 and prescribed Singulair to take in addition to his Allegra.  He does not done well with Flonase as it drains in the back of his throat and causes coughing.  He notices symptoms worsen when he goes outside or after he eats.  He does take omeprazole 20 mg daily for reflux.  He is not having any shortness of breath or wheezing.  Unfortunately he does not have insurance currently.  Past Medical History:  Diagnosis Date   Allergy    Arthritis    Biceps tendon rupture    bilateral   Clostridium difficile infection 2014   Colon polyps    Diabetes ([place])    Borderline/Pre-diabetes   GERD (gastroesophageal reflux disease)    History of DVT (deep vein thrombosis)    History of pulmonary embolism    Hyperlipidemia    Hypertension    Internal bleeding hemorrhoids 2012   "might bleed once/month; only when I strain"   Internal prolapsed hemorrhoids    Leg cramps    Neuromuscular disorder (HCC)    leg cramps    Testicular hypofunction    Wart of face    Right cheek    Objective: BP 120/68 (BP Location: Left Arm, Patient Position: Sitting, Cuff Size: Normal)    Pulse 88    Temp 98.9 F (37.2 C) (Oral)    Ht 5\' 8"  (1.727 m)    Wt 260 lb 2 oz (118 kg)    SpO2 94%    BMI 39.55 kg/m  General: Awake, appears stated age HEENT: MMM, EOMi, ears pain, TMs negative, nares are patent without discharge, sinuses are nontender to palpation Heart: RRR, no lower extremity edema Lungs: CTAB, no rales, wheezes or rhonchi. No accessory muscle use Psych: Age appropriate judgment and insight, normal affect and mood  Assessment and Plan: Gastroesophageal reflux disease, unspecified whether esophagitis present  Upper airway cough syndrome - Plan: methylPREDNISolone acetate (DEPO-MEDROL) injection 80  mg  I think he has a component of both reflux and upper airway cough syndrome.  He may have a component of allergy induced asthma.  We will start another steroid nasal spray and change his omeprazole to pantoprazole 40 mg daily. Will see if any pharm reps can help with ICS.  The patient voiced understanding and agreement to the plan.  [place], DO 10/22/20  4:39 PM

## 2020-10-28 ENCOUNTER — Other Ambulatory Visit: Payer: Self-pay | Admitting: Family Medicine

## 2020-12-02 ENCOUNTER — Telehealth: Payer: Self-pay | Admitting: Gastroenterology

## 2020-12-02 NOTE — Telephone Encounter (Signed)
OK to proceed with colonoscopy    Outside records reviewed  Colonoscopy 2012: tubular adenoma and hyperplastic polyps Colonoscopy with Dr. Bryn Gulling 08/17/14 revealed internal hemorrhoids but was otherwise normal. Repeat colonoscopy recommended in 5 years.   Preprocedure office visit notes a history of hemorrhoids, anal fissure, and colon cancer in a grandparent.  Hemorrhoidectomy, fissurectomy and internal sphincterotomy 06/24/13 with Dr. Lucillie Garfinkel.

## 2020-12-02 NOTE — Telephone Encounter (Signed)
Good morning Dr. Tarri Glenn, we have received a referral and records for patient to have a colonoscopy.  Last colon was in 2015.  Records will be sent to you for review.  Please advise on scheduling.  Thank you.

## 2020-12-06 NOTE — Telephone Encounter (Signed)
Left voicemail for patient to call back to schedule colonoscopy.

## 2020-12-08 ENCOUNTER — Encounter: Payer: Self-pay | Admitting: Gastroenterology

## 2020-12-08 NOTE — Telephone Encounter (Signed)
Spoke with patient; scheduled office visit for 01/24/2021.  Would like to meet MD before having procedure.

## 2020-12-12 ENCOUNTER — Other Ambulatory Visit: Payer: Self-pay | Admitting: Family Medicine

## 2020-12-13 NOTE — Telephone Encounter (Signed)
Advise on refill not on current list

## 2021-01-24 ENCOUNTER — Encounter: Payer: Self-pay | Admitting: Gastroenterology

## 2021-01-24 ENCOUNTER — Ambulatory Visit: Payer: Self-pay | Admitting: Gastroenterology

## 2021-01-24 ENCOUNTER — Telehealth: Payer: Self-pay

## 2021-01-24 VITALS — BP 132/90 | HR 96 | Ht 68.0 in | Wt 265.0 lb

## 2021-01-24 DIAGNOSIS — Z7902 Long term (current) use of antithrombotics/antiplatelets: Secondary | ICD-10-CM

## 2021-01-24 DIAGNOSIS — Z86010 Personal history of colonic polyps: Secondary | ICD-10-CM

## 2021-01-24 NOTE — Patient Instructions (Addendum)
RECOMMENDATION(S):   COLONOSCOPY AND ENDOSCOPY:   You have been scheduled for an endoscopy and colonoscopy. Please follow the written instructions given to you at your visit today.  PREP:   Please pick up your prep supplies at the pharmacy within the next 1-3 days.  INHALERS:   If you use inhalers (even only as needed), please bring them with you on the day of your procedure.  XARELTO:  We will contact Dr. Nani Ravens to request permission for you to hold your Xarelto. You will be contacted by our office to provide you with his instructions. If you do not hear from our office 1 week prior to your scheduled procedure, please contact our office at [phone].   BMI:  If you are age 71 or younger, your body mass index should be between 19-25. Your Body mass index is 40.29 kg/m. If this is out of the aformentioned range listed, please consider follow up with your Primary Care Provider.   Thank you for trusting me with your gastrointestinal care!    Th[place]ton [place], MD, MPH

## 2021-01-24 NOTE — Progress Notes (Addendum)
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   I discussed with the patient that there is a low, but real, risk of a cardiovascular event such as heart attack, stroke, or embolism/thrombosis while off Eliquis. Ideally, would proceed with 48 hour Xarelto washout prior to colonoscopy if okay with the Dr. Nani Ravens given the potential need for polypectomy.  Suspected outlet bleeding from recurrent fissure or hemorrhoids based on history. Will perform full rectal exam at time of colonoscopy.   PLAN: Colonoscopy with Miralax/Gatorade prep after a Xarelto washout if approved by Dr. Nani Ravens  Please see the "Patient Instructions" section for addition details about the plan.  HPI: Ian [name] is a 60 y.o. male referred for surveillance colonoscopy. The history is obtained through the patient, review of his electronic health record, and review of prior colonoscopy records from [place].  Works in Teacher, adult education care.   Review of outside records shows: - Colonoscopy 2012: tubular adenoma and hyperplastic polyps - Colonoscopy with Dr. Bryn Gulling 08/17/14 revealed internal hemorrhoids but was otherwise normal. Repeat colonoscopy recommended in 5 years.  - Preprocedure office visit notes a history of hemorrhoids, anal fissure, and colon cancer in a grandparent.  - Hemorrhoidectomy, fissurectomy and internal sphincterotomy 06/24/13 with Dr. Lucillie Garfinkel.   Presents for surveillance colonoscopy in the setting of chronic Xarelto use.  Reports recent, intermittent bright red rectal bleeding that Dr. Nani Ravens suggested was due to a fissure. No history of rectal pain or trauma. No constipation or straining.  GI ROS is otherwise negative.  Labs 05/14/20: hemoglobin 13.2, MCV 93.9, RDW 13.1, platelets 141  Maternal grandmother died of colon cancer. No other known family history of colon cancer or polyps. No family history of uterine/endometrial cancer, pancreatic cancer or gastric/stomach cancer.   Past Medical History:  Diagnosis Date  . Allergy   . Arthritis   . Biceps tendon rupture    bilateral  . Clostridium difficile infection 2014  . Colon polyps   . Diabetes ([place])    Borderline/Pre-diabetes  . GERD (gastroesophageal reflux disease)   . History of DVT (deep vein thrombosis)   . History of pulmonary embolism   . Hyperlipidemia   . Hypertension   . Internal bleeding hemorrhoids 2012   "might bleed once/month; only when I strain"  . Internal prolapsed hemorrhoids   . Leg cramps   . Neuromuscular disorder (HCC)    leg cramps   . Testicular hypofunction   . Wart of face    Right cheek    Past Surgical History:  Procedure Laterality Date  . ANAL FISSURE REPAIR  06/24/2013  . APPENDECTOMY  2011  . BACK SURGERY    . COLONOSCOPY W/ BIOPSIES AND POLYPECTOMY  2012  . COLONSCOPY  2015  . EXCISIONAL HEMORRHOIDECTOMY    . FISSURECTOMY    . HEMORRHOID SURGERY  06/24/2013  . internal sphincterotomy  06-24-2013  . LUMBAR LAMINECTOMY/DECOMPRESSION MICRODISCECTOMY N/A 01/20/2015   Procedure: MICRO LUMBAR DECOMPRESSION L4-5/L3-4/L2-3;  Surgeon: Johnn Hai, MD;  Location: [facility];  Service: Orthopedics;  Laterality: N/A;  .  QUADRICEPS TENDON REPAIR Left 05/13/2020   Procedure: REPAIR QUADRICEP TENDON;  Surgeon: Susa Day, MD;  Location: [facility];  Service: Orthopedics;  Laterality: Left;  . SHOULDER ARTHROSCOPY  08/22/2012   Procedure: ARTHROSCOPY SHOULDER;  Surgeon: Marin Shutter, MD;  Location: [facility];  Service:  Orthopedics;  Laterality: Left;  Left shoulder arthroscopic lavage and debridement  . SHOULDER ARTHROSCOPY  09/02/2012   Procedure: ARTHROSCOPY SHOULDER;  Surgeon: Marin Shutter, MD;  Location: [place];  Service: Orthopedics;  Laterality: Left;  Left shoulder arthroscopy irrigation and debridement  . SHOULDER ARTHROSCOPY W/ ROTATOR CUFF REPAIR  07/09/2012   left  . SHOULDER OPEN ROTATOR CUFF REPAIR Right 12/13/2017   Procedure: Right shoulder mini open rotator cuff repair;  Surgeon: Susa Day, MD;  Location: [facility];  Service: Orthopedics;  Laterality: Right;  Interscalene Block  . SKIN LESION EXCISION     Benign  . WISDOM TOOTH EXTRACTION  YRS AGO    Current Outpatient Medications  Medication Sig Dispense Refill  . Blood Glucose Monitoring Suppl (RELION CONFIRM GLUCOSE MONITOR) W/DEVICE KIT USE AS DIRECTED TO TEST BLOOD GLUCOSE DAILY DX: 250.00 1 kit 0  . Cholecalciferol (VITAMIN D-3) 5000 UNITS TABS Take 5,000 Units by mouth daily.     Marland Kitchen CINNAMON PO Take 2,400 mg by mouth daily.    . cyclobenzaprine (FLEXERIL) 10 MG tablet TAKE ONE TABLET BY MOUTH THREE TIMES A DAY AS NEEDED MUSCLE SPASMS 90 tablet 1  . docusate sodium (COLACE) 100 MG capsule Take 1 capsule (100 mg total) by mouth 2 (two) times daily as needed for mild constipation. 40 capsule 0  . EPIPEN 2-PAK 0.3 MG/0.3ML SOAJ injection Inject 0.3 mg into the skin once.    . fenofibrate (TRICOR) 145 MG tablet TAKE ONE TABLET BY MOUTH DAILY 90 tablet 5  . glyBURIDE (DIABETA) 5 MG tablet TAKE ONE TABLET BY MOUTH TWICE A DAY WITH MEALS 180 tablet 4  . hydrochlorothiazide (HYDRODIURIL) 25 MG tablet TAKE ONE TABLET BY MOUTH DAILY 90 tablet 1  . hydrocortisone 2.5 % cream Apply to bottom twice daily for 10 days. 30 g 0  . loratadine (CLARITIN) 10 MG tablet Take 10 mg by mouth daily.    . Magnesium 400 MG TABS Take 800 mg by mouth daily.     . methocarbamol (ROBAXIN) 500 MG tablet Take 1 tablet (500 mg total) by mouth every 6 (six) hours as  needed for muscle spasms. 40 tablet 1  . mometasone (NASONEX) 50 MCG/ACT nasal spray Place 2 sprays into the nose daily. 1 each 12  . mupirocin ointment (BACTROBAN) 2 % Apply 1 application topically 2 (two) times daily. 22 g 0  . pantoprazole (PROTONIX) 40 MG tablet Take 1 tablet (40 mg total) by mouth daily. 30 tablet 3  . pioglitazone (ACTOS) 30 MG tablet Take 1 tablet (30 mg total) by mouth daily. 90 tablet 2  . potassium gluconate 595 (99 K) MG TABS tablet Take 595 mg by mouth daily.     . rivaroxaban (XARELTO) 20 MG TABS tablet Take 1 tablet (20 mg total) by mouth daily with supper.    . rosuvastatin (CRESTOR) 20 MG tablet TAKE 1 TABLET BY MOUTH DAILY 90 tablet 1   No current facility-administered medications for this visit.    Allergies as of 01/24/2021 - Review Complete 01/24/2021  Allergen Reaction Noted  . Penicillins Rash, Other (See Comments), Diarrhea, and Hives 08/21/2012  . Tetanus toxoid, adsorbed Anaphylaxis 05/13/2013  . Tetanus toxoids Anaphylaxis  08/21/2012  . Aspirin Nausea Only and Rash 08/21/2012  . Daptomycin Other (See Comments) 09/05/2012  . Prednisone Other (See Comments) and Rash 05/13/2013  . Bioflavonoids Diarrhea 11/15/2015  . Other  12/10/2017  . Tetanus immune globulin  04/29/2018  . Citrus Diarrhea and Other (See Comments) 01/22/2015  . Fenofibrate Other (See Comments) 05/20/2014  . Milk-related compounds Diarrhea and Other (See Comments) 01/22/2015  . Oxycodone Other (See Comments) 03/08/2015    Family History  Problem Relation Age of Onset  . Heart disease Mother 19       Deceased  . Hyperlipidemia Mother   . Hypertension Mother   . Heart disease Father 32       Deceased  . CVA Father   . Hypertension Maternal Grandmother   . Hyperlipidemia Maternal Grandmother   . Cancer Maternal Grandmother        Colon Cancer  . Heart disease Maternal Grandfather   . Hypertension Maternal Grandfather   . Hyperlipidemia Maternal Grandfather   . Cancer  Maternal Grandfather        Lung Cancer  . Heart attack Maternal Grandfather   . Heart disease Paternal Grandfather     Social History   Socioeconomic History  . Marital status: Married    Spouse name: Not on file  . Number of children: Not on file  . Years of education: Not on file  . Highest education level: Not on file  Occupational History  . Not on file  Tobacco Use  . Smoking status: Never Smoker  . Smokeless tobacco: Never Used  . Tobacco comment: NEVER USED TOBACCO  Vaping Use  . Vaping Use: Never used  Substance and Sexual Activity  . Alcohol use: No    Alcohol/week: 0.0 standard drinks  . Drug use: No  . Sexual activity: Not Currently  Other Topics Concern  . Not on file  Social History Narrative   Marital Status: Married Pamala Hurry)    Children:  Step Daughter Sharyn Lull)    Pets: None    Living Situation: Lives with Pamala Hurry    Occupation: [employer] ([place]    Education: Associate's Degree in [employer]    Tobacco Use/Exposure:  None    Alcohol Use:  Rarely    Drug Use:  None   Diet:  Regular   Exercise: Limited     Hobbies:  Location manager, IT consultant Determinants of Radio broadcast assistant Strain: Not on file  Food Insecurity: Not on file  Transportation Needs: Not on file  Physical Activity: Not on file  Stress: Not on file  Social Connections: Not on file  Intimate Partner Violence: Not on file    Review of Systems: 12 system ROS is negative except as noted above with the additions of allergies, arthritis, back pain, cough, nosebleeds, and swelling of feet/legs.   Physical Exam: General:   Alert,  well-nourished, pleasant and cooperative in NAD Head:  Normocephalic and atraumatic. Eyes:  Sclera clear, no icterus.   Conjunctiva pink. Ears:  Normal auditory acuity. Nose:  No deformity, discharge,  or lesions. Mouth:  No deformity or lesions.   Neck:  Supple; no masses or thyromegaly. Lungs:  Clear throughout to  auscultation.   No wheezes. Heart:  Regular rate and rhythm; no murmurs. Abdomen:  Soft, nontender, nondistended, normal bowel sounds, no rebound or guarding. No hepatosplenomegaly.   Rectal:  Deferred to time of colonoscopy Msk:  Symmetrical. No boney deformities LAD: No inguinal or umbilical LAD Extremities:  No clubbing or edema. Neurologic:  Alert and  oriented x4;  grossly nonfocal Skin:  Intact without significant lesions or rashes. Psych:  Alert and cooperative. Normal mood and affect.   Anaisabel Pederson L. Tarri Glenn, MD, MPH 01/24/2021, 3:17 PM

## 2021-01-24 NOTE — Telephone Encounter (Signed)
Called pt to inform about instructions below. LVM requesting returned call. 

## 2021-01-24 NOTE — Telephone Encounter (Signed)
[facility] Pre-operative Risk Assessment     Ian [name] 1961-07-06 [id number]  Procedure: Colonoscopy Procedure Date: 03/22/21 Provider: Dr. Tarri Glenn Practice: [facility]  Phone: [phone] Fax: [phone] ATTENTION: Yalitza Teed, LPN  Request for surgical clearance:  Colonoscopy Procedure  Anesthesia type (None, local, MAC, general) ?  MAC  What type of clearance is required ?   Pharmacy  Medication(s) needing held: XARELTO   Length of time to be held? 2 DAYS PRIOR TO PROCEDURE

## 2021-01-24 NOTE — Telephone Encounter (Signed)
Yes, that's correct

## 2021-01-26 NOTE — Telephone Encounter (Signed)
Called pt to inform about orders below. Verbalized acceptance and understanding.

## 2021-02-02 ENCOUNTER — Other Ambulatory Visit: Payer: Self-pay | Admitting: Family Medicine

## 2021-02-10 ENCOUNTER — Other Ambulatory Visit: Payer: Self-pay | Admitting: Family Medicine

## 2021-02-10 DIAGNOSIS — K219 Gastro-esophageal reflux disease without esophagitis: Secondary | ICD-10-CM

## 2021-02-10 DIAGNOSIS — E1165 Type 2 diabetes mellitus with hyperglycemia: Secondary | ICD-10-CM

## 2021-02-14 ENCOUNTER — Other Ambulatory Visit: Payer: Self-pay

## 2021-02-14 ENCOUNTER — Ambulatory Visit (INDEPENDENT_AMBULATORY_CARE_PROVIDER_SITE_OTHER): Payer: Self-pay | Admitting: Family Medicine

## 2021-02-14 ENCOUNTER — Encounter: Payer: Self-pay | Admitting: Family Medicine

## 2021-02-14 VITALS — BP 132/68 | HR 92 | Temp 97.9°F | Resp 20 | Ht 68.0 in | Wt 266.0 lb

## 2021-02-14 DIAGNOSIS — E669 Obesity, unspecified: Secondary | ICD-10-CM

## 2021-02-14 DIAGNOSIS — E782 Mixed hyperlipidemia: Secondary | ICD-10-CM

## 2021-02-14 DIAGNOSIS — E119 Type 2 diabetes mellitus without complications: Secondary | ICD-10-CM

## 2021-02-14 DIAGNOSIS — Z125 Encounter for screening for malignant neoplasm of prostate: Secondary | ICD-10-CM

## 2021-02-14 DIAGNOSIS — E1169 Type 2 diabetes mellitus with other specified complication: Secondary | ICD-10-CM

## 2021-02-14 LAB — COMPREHENSIVE METABOLIC PANEL
ALT: 40 U/L (ref 0–53)
AST: 36 U/L (ref 0–37)
Albumin: 4.2 g/dL (ref 3.5–5.2)
Alkaline Phosphatase: 46 U/L (ref 39–117)
BUN: 13 mg/dL (ref 6–23)
CO2: 30 mEq/L (ref 19–32)
Calcium: 10.2 mg/dL (ref 8.4–10.5)
Chloride: 99 mEq/L (ref 96–112)
Creatinine, Ser: 1.17 mg/dL (ref 0.40–1.50)
GFR: 67.32 mL/min (ref >60.00)
Glucose, Bld: 125 mg/dL — ABNORMAL HIGH (ref 70–99)
Potassium: 3.8 mEq/L (ref 3.5–5.1)
Sodium: 140 mEq/L (ref 135–145)
Total Bilirubin: 1 mg/dL (ref 0.2–1.2)
Total Protein: 6.9 g/dL (ref 6.0–8.3)

## 2021-02-14 LAB — LIPID PANEL
Cholesterol: 191 mg/dL (ref 0–200)
HDL: 35.8 mg/dL — ABNORMAL LOW (ref >39.00)
NonHDL: 155.68
Total CHOL/HDL Ratio: 5
Triglycerides: 252 mg/dL — ABNORMAL HIGH (ref 0.0–149.0)
VLDL: 50.4 mg/dL — ABNORMAL HIGH (ref 0.0–40.0)

## 2021-02-14 LAB — LDL CHOLESTEROL, DIRECT: Direct LDL: 122 mg/dL

## 2021-02-14 LAB — PSA: PSA: 0.98 ng/mL (ref 0.10–4.00)

## 2021-02-14 LAB — HEMOGLOBIN A1C: Hgb A1c MFr Bld: 7.6 % — ABNORMAL HIGH (ref 4.6–6.5)

## 2021-02-14 NOTE — Progress Notes (Signed)
Subjective:   Chief Complaint  Patient presents with  . type 2 diabetes    6 month follow up    Ian [name] is a 60 y.o. male here for follow-up of diabetes.   Ian [name]'s self monitored glucose range is 80-120's.  Patient denies hypoglycemic reactions. He checks his glucose levels 3 time(s) per day. Patient does not require insulin.   Medications include: glyburide 5 mg bid Diet is healthy overall.  Exercise: some walking No CP or SOB.  Hyperlipidemia Patient presents for dyslipidemia follow up. Currently being treated with Crestor 20 mg/d, fenofibrate 48 mg/d and compliance with treatment thus far has been good. He denies myalgias. Diet/exercise as above The patient is not known to have coexisting coronary artery disease.  Past Medical History:  Diagnosis Date  . Allergy   . Arthritis   . Biceps tendon rupture    bilateral  . Clostridium difficile infection 2014  . Colon polyps   . Diabetes ([place])    Borderline/Pre-diabetes  . GERD (gastroesophageal reflux disease)   . History of DVT (deep vein thrombosis)   . History of pulmonary embolism   . Hyperlipidemia   . Hypertension   . Internal bleeding hemorrhoids 2012   "might bleed once/month; only when I strain"  . Internal prolapsed hemorrhoids   . Leg cramps   . Neuromuscular disorder (HCC)    leg cramps   . Testicular hypofunction   . Wart of face    Right cheek     Related testing: Retinal exam: Due Pneumovax: done  Objective:  BP 132/68   Pulse 92   Temp 97.9 F (36.6 C) (Oral)   Resp 20   Ht 5\' 8"  (1.727 m)   Wt 266 lb (120.7 kg)   SpO2 96%   BMI 40.45 kg/m  General:  Well developed, well nourished, in no apparent distress Skin:  Warm, no pallor or diaphoresis Eyes:  Pupils equal and round, sclera anicteric without injection  Lungs:  CTAB, no access msc use Cardio:  RRR, no bruits, no LE edema Psych: Age appropriate judgment and insight  Assessment:   Diabetes mellitus type 2 in obese  (HCC) - Plan: Comprehensive metabolic panel, Lipid panel, Hemoglobin A1c  Mixed hyperlipidemia  Screening for prostate cancer - Plan: PSA  Morbid obesity ([place])   Plan:   1. Cont glyburide 5 mg bid, Actos 30 mg/d.  Counseled on diet and exercise. Monitor sugars w addn of GLP-1 for a 68mo. 2. Cont Tricor 48 mg/d and Crestor 20 mg/d.  3. Requested PSA screening. 4. Add Trulicity 0.35 mg weekly for 2 weeks and then 1.5 mg weekly for 2 weeks. Samples provided. Hopefully this can kick start better satiety control in the evenings.  F/u in 6 mo. The patient voiced understanding and agreement to the plan.  [place], DO 02/14/21 11:16 AM

## 2021-02-14 NOTE — Patient Instructions (Addendum)
Give [place] 2-3 business days to get the results of your labs back.   Keep the diet clean and stay active.  Aim to do some physical exertion for 150 minutes per week. This is typically divided into 5 days per week, 30 minutes per day. The activity should be enough to get your heart rate up. Anything is better than nothing if you have time constraints.  Heat (pad or rice pillow in microwave) over affected area, 10-15 minutes twice daily.   Ice/cold pack over area for 10-15 min twice daily.  OK to take Tylenol 1000 mg (2 extra strength tabs) or 975 mg (3 regular strength tabs) every 6 hours as needed.  Monitor your sugars while on the injections. If it starts dropping low, stop the glyburide and let me know please.   Let us know if you need anything.  EXERCISES  RANGE OF MOTION (ROM) AND STRETCHING EXERCISES - Low Back Pain Most people with lower back pain will find that their symptoms get worse with excessive bending forward (flexion) or arching at the lower back (extension). The exercises that will help resolve your symptoms will focus on the opposite motion.  If you have pain, numbness or tingling which travels down into your buttocks, leg or foot, the goal of the therapy is for these symptoms to move closer to your back and eventually resolve. Sometimes, these leg symptoms will get better, but your lower back pain may worsen. This is often an indication of progress in your rehabilitation. Be very alert to any changes in your symptoms and the activities in which you participated in the 24 hours prior to the change. Sharing this information with your caregiver will allow him or her to most efficiently treat your condition. These exercises may help you when beginning to rehabilitate your injury. Your symptoms may resolve with or without further involvement from your physician, physical therapist or athletic trainer. While completing these exercises, remember:   Restoring tissue flexibility helps  normal motion to return to the joints. This allows healthier, less painful movement and activity.  An effective stretch should be held for at least 30 seconds.  A stretch should never be painful. You should only feel a gentle lengthening or release in the stretched tissue. FLEXION RANGE OF MOTION AND STRETCHING EXERCISES:  STRETCH - Flexion, Single Knee to Chest   Lie on a firm bed or floor with both legs extended in front of you.  Keeping one leg in contact with the floor, bring your opposite knee to your chest. Hold your leg in place by either grabbing behind your thigh or at your knee.  Pull until you feel a gentle stretch in your low back. Hold 30 seconds.  Slowly release your grasp and repeat the exercise with the opposite side. Repeat 2 times. Complete this exercise 3 times per week.   STRETCH - Flexion, Double Knee to Chest  Lie on a firm bed or floor with both legs extended in front of you.  Keeping one leg in contact with the floor, bring your opposite knee to your chest.  Tense your stomach muscles to support your back and then lift your other knee to your chest. Hold your legs in place by either grabbing behind your thighs or at your knees.  Pull both knees toward your chest until you feel a gentle stretch in your low back. Hold 30 seconds.  Tense your stomach muscles and slowly return one leg at a time to the floor. Repeat 2  times. Complete this exercise 3 times per week.   STRETCH - Low Trunk Rotation  Lie on a firm bed or floor. Keeping your legs in front of you, bend your knees so they are both pointed toward the ceiling and your feet are flat on the floor.  Extend your arms out to the side. This will stabilize your upper body by keeping your shoulders in contact with the floor.  Gently and slowly drop both knees together to one side until you feel a gentle stretch in your low back. Hold for 30 seconds.  Tense your stomach muscles to support your lower back as  you bring your knees back to the starting position. Repeat the exercise to the other side. Repeat 2 times. Complete this exercise at least 3 times per week.   EXTENSION RANGE OF MOTION AND FLEXIBILITY EXERCISES:  STRETCH - Extension, Prone on Elbows   Lie on your stomach on the floor, a bed will be too soft. Place your palms about shoulder width apart and at the height of your head.  Place your elbows under your shoulders. If this is too painful, stack pillows under your chest.  Allow your body to relax so that your hips drop lower and make contact more completely with the floor.  Hold this position for 30 seconds.  Slowly return to lying flat on the floor. Repeat 2 times. Complete this exercise 3 times per week.   RANGE OF MOTION - Extension, Prone Press Ups  Lie on your stomach on the floor, a bed will be too soft. Place your palms about shoulder width apart and at the height of your head.  Keeping your back as relaxed as possible, slowly straighten your elbows while keeping your hips on the floor. You may adjust the placement of your hands to maximize your comfort. As you gain motion, your hands will come more underneath your shoulders.  Hold this position 30 seconds.  Slowly return to lying flat on the floor. Repeat 2 times. Complete this exercise 3 times per week.   RANGE OF MOTION- Quadruped, Neutral Spine   Assume a hands and knees position on a firm surface. Keep your hands under your shoulders and your knees under your hips. You may place padding under your knees for comfort.  Drop your head and point your tailbone toward the ground below you. This will round out your lower back like an angry cat. Hold this position for 30 seconds.  Slowly lift your head and release your tail bone so that your back sags into a large arch, like an old horse.  Hold this position for 30 seconds.  Repeat this until you feel limber in your low back.  Now, find your "sweet spot." This will  be the most comfortable position somewhere between the two previous positions. This is your neutral spine. Once you have found this position, tense your stomach muscles to support your low back.  Hold this position for 30 seconds. Repeat 2 times. Complete this exercise 3 times per week.   STRENGTHENING EXERCISES - Low Back Sprain These exercises may help you when beginning to rehabilitate your injury. These exercises should be done near your "sweet spot." This is the neutral, low-back arch, somewhere between fully rounded and fully arched, that is your least painful position. When performed in this safe range of motion, these exercises can be used for people who have either a flexion or extension based injury. These exercises may resolve your symptoms with or without  further involvement from your physician, physical therapist or athletic trainer. While completing these exercises, remember:   Muscles can gain both the endurance and the strength needed for everyday activities through controlled exercises.  Complete these exercises as instructed by your physician, physical therapist or athletic trainer. Increase the resistance and repetitions only as guided.  You may experience muscle soreness or fatigue, but the pain or discomfort you are trying to eliminate should never worsen during these exercises. If this pain does worsen, stop and make certain you are following the directions exactly. If the pain is still present after adjustments, discontinue the exercise until you can discuss the trouble with your caregiver.  STRENGTHENING - Deep Abdominals, Pelvic Tilt   Lie on a firm bed or floor. Keeping your legs in front of you, bend your knees so they are both pointed toward the ceiling and your feet are flat on the floor.  Tense your lower abdominal muscles to press your low back into the floor. This motion will rotate your pelvis so that your tail bone is scooping upwards rather than pointing at your  feet or into the floor. With a gentle tension and even breathing, hold this position for 3 seconds. Repeat 2 times. Complete this exercise 3 times per week.   STRENGTHENING - Abdominals, Crunches   Lie on a firm bed or floor. Keeping your legs in front of you, bend your knees so they are both pointed toward the ceiling and your feet are flat on the floor. Cross your arms over your chest.  Slightly tip your chin down without bending your neck.  Tense your abdominals and slowly lift your trunk high enough to just clear your shoulder blades. Lifting higher can put excessive stress on the lower back and does not further strengthen your abdominal muscles.  Control your return to the starting position. Repeat 2 times. Complete this exercise 3 times per week.   STRENGTHENING - Quadruped, Opposite UE/LE Lift   Assume a hands and knees position on a firm surface. Keep your hands under your shoulders and your knees under your hips. You may place padding under your knees for comfort.  Find your neutral spine and gently tense your abdominal muscles so that you can maintain this position. Your shoulders and hips should form a rectangle that is parallel with the floor and is not twisted.  Keeping your trunk steady, lift your right hand no higher than your shoulder and then your left leg no higher than your hip. Make sure you are not holding your breath. Hold this position for 30 seconds.  Continuing to keep your abdominal muscles tense and your back steady, slowly return to your starting position. Repeat with the opposite arm and leg. Repeat 2 times. Complete this exercise 3 times per week.   STRENGTHENING - Abdominals and Quadriceps, Straight Leg Raise   Lie on a firm bed or floor with both legs extended in front of you.  Keeping one leg in contact with the floor, bend the other knee so that your foot can rest flat on the floor.  Find your neutral spine, and tense your abdominal muscles to  maintain your spinal position throughout the exercise.  Slowly lift your straight leg off the floor about 6 inches for a count of 3, making sure to not hold your breath.  Still keeping your neutral spine, slowly lower your leg all the way to the floor. Repeat this exercise with each leg 2 times. Complete this exercise 3 times  per week.  POSTURE AND BODY MECHANICS CONSIDERATIONS - Low Back Sprain Keeping correct posture when sitting, standing or completing your activities will reduce the stress put on different body tissues, allowing injured tissues a chance to heal and limiting painful experiences. The following are general guidelines for improved posture.  While reading these guidelines, remember:  The exercises prescribed by your provider will help you have the flexibility and strength to maintain correct postures.  The correct posture provides the best environment for your joints to work. All of your joints have less wear and tear when properly supported by a spine with good posture. This means you will experience a healthier, less painful body.  Correct posture must be practiced with all of your activities, especially prolonged sitting and standing. Correct posture is as important when doing repetitive low-stress activities (typing) as it is when doing a single heavy-load activity (lifting).  RESTING POSITIONS Consider which positions are most painful for you when choosing a resting position. If you have pain with flexion-based activities (sitting, bending, stooping, squatting), choose a position that allows you to rest in a less flexed posture. You would want to avoid curling into a fetal position on your side. If your pain worsens with extension-based activities (prolonged standing, working overhead), avoid resting in an extended position such as sleeping on your stomach. Most people will find more comfort when they rest with their spine in a more neutral position, neither too rounded nor too  arched. Lying on a non-sagging bed on your side with a pillow between your knees, or on your back with a pillow under your knees will often provide some relief. Keep in mind, being in any one position for a prolonged period of time, no matter how correct your posture, can still lead to stiffness.  PROPER SITTING POSTURE In order to minimize stress and discomfort on your spine, you must sit with correct posture. Sitting with good posture should be effortless for a healthy body. Returning to good posture is a gradual process. Many people can work toward this most comfortably by using various supports until they have the flexibility and strength to maintain this posture on their own. When sitting with proper posture, your ears will fall over your shoulders and your shoulders will fall over your hips. You should use the back of the chair to support your upper back. Your lower back will be in a neutral position, just slightly arched. You may place a small pillow or folded towel at the base of your lower back for  support.  When working at a desk, create an environment that supports good, upright posture. Without extra support, muscles tire, which leads to excessive strain on joints and other tissues. Keep these recommendations in mind:  CHAIR:  A chair should be able to slide under your desk when your back makes contact with the back of the chair. This allows you to work closely.  The chair's height should allow your eyes to be level with the upper part of your monitor and your hands to be slightly lower than your elbows.  BODY POSITION  Your feet should make contact with the floor. If this is not possible, use a foot rest.  Keep your ears over your shoulders. This will reduce stress on your neck and low back.  INCORRECT SITTING POSTURES  If you are feeling tired and unable to assume a healthy sitting posture, do not slouch or slump. This puts excessive strain on your back tissues, causing more  damage and pain. Healthier options include:  Using more support, like a lumbar pillow.  Switching tasks to something that requires you to be upright or walking.  Talking a brief walk.  Lying down to rest in a neutral-spine position.  PROLONGED STANDING WHILE SLIGHTLY LEANING FORWARD  When completing a task that requires you to lean forward while standing in one place for a long time, place either foot up on a stationary 2-4 inch high object to help maintain the best posture. When both feet are on the ground, the lower back tends to lose its slight inward curve. If this curve flattens (or becomes too large), then the back and your other joints will experience too much stress, tire more quickly, and can cause pain.  CORRECT STANDING POSTURES Proper standing posture should be assumed with all daily activities, even if they only take a few moments, like when brushing your teeth. As in sitting, your ears should fall over your shoulders and your shoulders should fall over your hips. You should keep a slight tension in your abdominal muscles to brace your spine. Your tailbone should point down to the ground, not behind your body, resulting in an over-extended swayback posture.   INCORRECT STANDING POSTURES  Common incorrect standing postures include a forward head, locked knees and/or an excessive swayback. WALKING Walk with an upright posture. Your ears, shoulders and hips should all line-up.  PROLONGED ACTIVITY IN A FLEXED POSITION When completing a task that requires you to bend forward at your waist or lean over a low surface, try to find a way to stabilize 3 out of 4 of your limbs. You can place a hand or elbow on your thigh or rest a knee on the surface you are reaching across. This will provide you more stability, so that your muscles do not tire as quickly. By keeping your knees relaxed, or slightly bent, you will also reduce stress across your lower back. CORRECT LIFTING TECHNIQUES  DO  :  Assume a wide stance. This will provide you more stability and the opportunity to get as close as possible to the object which you are lifting.  Tense your abdominals to brace your spine. Bend at the knees and hips. Keeping your back locked in a neutral-spine position, lift using your leg muscles. Lift with your legs, keeping your back straight.  Test the weight of unknown objects before attempting to lift them.  Try to keep your elbows locked down at your sides in order get the best strength from your shoulders when carrying an object.     Always ask for help when lifting heavy or awkward objects. INCORRECT LIFTING TECHNIQUES DO NOT:   Lock your knees when lifting, even if it is a small object.  Bend and twist. Pivot at your feet or move your feet when needing to change directions.  Assume that you can safely pick up even a paperclip without proper posture.

## 2021-02-15 ENCOUNTER — Telehealth: Payer: Self-pay | Admitting: Family Medicine

## 2021-02-15 MED ORDER — EZETIMIBE 10 MG PO TABS
10.0000 mg | ORAL_TABLET | Freq: Every day | ORAL | 3 refills | Status: DC
Start: 2021-02-15 — End: 2021-06-13

## 2021-02-15 NOTE — Telephone Encounter (Signed)
Patient came in and brought blood sugar levels, put into [facility] bin  Also stated that wendling wanted to start him on a new medicine and said he is fine with that We also set up a 6 week appt wendling asked for following start of medication

## 2021-02-15 NOTE — Addendum Note (Signed)
Addended by: Ames Coupe on: 02/15/2021 04:35 PM   Modules accepted: Orders

## 2021-02-15 NOTE — Telephone Encounter (Signed)
Called left message to call back 

## 2021-02-15 NOTE — Telephone Encounter (Signed)
Zetia sent. Please sched pt for labs and order lipid panel in 6 weeks. Ty.

## 2021-02-16 ENCOUNTER — Other Ambulatory Visit: Payer: Self-pay | Admitting: Family Medicine

## 2021-02-16 DIAGNOSIS — E782 Mixed hyperlipidemia: Secondary | ICD-10-CM

## 2021-02-16 NOTE — Telephone Encounter (Signed)
Called left message to call back 

## 2021-02-16 NOTE — Telephone Encounter (Signed)
Patient informed Scheduled appt/ put in order. Patients BS this am was 68

## 2021-03-08 ENCOUNTER — Ambulatory Visit (INDEPENDENT_AMBULATORY_CARE_PROVIDER_SITE_OTHER): Payer: Self-pay | Admitting: Family Medicine

## 2021-03-08 ENCOUNTER — Encounter: Payer: Self-pay | Admitting: Family Medicine

## 2021-03-08 ENCOUNTER — Other Ambulatory Visit: Payer: Self-pay

## 2021-03-08 VITALS — BP 136/82 | HR 86 | Temp 98.3°F | Resp 18 | Ht 68.0 in | Wt 242.0 lb

## 2021-03-08 DIAGNOSIS — R209 Unspecified disturbances of skin sensation: Secondary | ICD-10-CM

## 2021-03-08 DIAGNOSIS — J301 Allergic rhinitis due to pollen: Secondary | ICD-10-CM

## 2021-03-08 MED ORDER — METHYLPREDNISOLONE ACETATE 80 MG/ML IJ SUSP
80.0000 mg | Freq: Once | INTRAMUSCULAR | Status: AC
  Administered 2021-03-08: 80 mg via INTRAMUSCULAR

## 2021-03-08 MED ORDER — BUDESONIDE 32 MCG/ACT NA SUSP: 2.0000 | Freq: Every day | NASAL | 11 refills | Status: DC

## 2021-03-08 NOTE — Patient Instructions (Addendum)
Take the prescription to [facility] or [facility] for a cheaper version of the nasal spray.  Your sugars look great.   Continue with 0.75 weekly and then use the 1.5 mg weekly Trulicity.   If the feet get worse, let me know. This is nothing we are required to treat at this time.  Let us know if you need anything.

## 2021-03-08 NOTE — Progress Notes (Signed)
Chief Complaint  Patient presents with  . Allergies    Raspy voice, exposure to irritant while doing yard work, taking Human resources officer and mucinex      Ian [name] here for URI complaints.  Duration: 4 days  Associated symptoms: sinus congestion, rhinorrhea, ear fullness and slight cough, raspy voice Denies: sinus pain, itchy watery eyes, ear pain, ear drainage, sore throat, wheezing, shortness of breath, myalgia and fevers, N/V/D Treatment to date: INCS Sick contacts: No  Got hit in face w pollen while working outside.   Cold feet The patient has a history of bilateral feet coldness.  This is happened since he was a child.  He has not had any wounds or sores on his feet.  He thinks his second digits may become slightly blue.  It comes and goes.  He still has sensation in both of his feet.  His hands are unaffected.  He has never taken any medication for this.  He does not get pain in his feet or legs when he walks.  Past Medical History:  Diagnosis Date  . Allergy   . Arthritis   . Biceps tendon rupture    bilateral  . Clostridium difficile infection 2014  . Colon polyps   . 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 GERD (gastroesophageal reflux disease)   . History of DVT (deep vein thrombosis)   . History of pulmonary embolism   . Hyperlipidemia   . Hypertension   . Internal bleeding hemorrhoids 2012   "might bleed once/month; only when I strain"  . Internal prolapsed hemorrhoids   . Leg cramps   . Neuromuscular disorder ([facility])    leg cramps   . Testicular hypofunction   . Wart of face    Right cheek    BP 136/82 (BP Location: Left Arm, Patient Position: Sitting, Cuff Size: Normal)   Pulse 86   Temp 98.3 F (36.8 C)   Resp 18   Ht 5\' 8"  (1.727 m)   Wt 242 lb (109.8 kg)   SpO2 98%   BMI 36.80 kg/m  General: Awake, alert, appears stated age HEENT: AT, [place], ears patent b/l and TM's neg, nares patent w/o discharge, pharynx pink and without exudates, tonsillar  pillars injected, MMM Neck: No masses or asymmetry Heart: RRR, DP pulses 2+ bilaterally, brisk capillary refill Lungs: CTAB, no accessory muscle use Psych: Age appropriate judgment and insight, normal mood and affect  Seasonal allergic rhinitis due to pollen - Plan: methylPREDNISolone acetate (DEPO-MEDROL) injection 80 mg  Bilateral cold feet  1.  Depo injection today which she has tolerated well in the past.  Continue to push fluids, practice good hand hygiene, cover mouth when coughing.  Continue intranasal corticosteroid, wrote a prescription for Rhinocort as this may be cheaper with [facility]. 2.  Reassurance, likely a low degree of Raynaud's phenomenon.  No signs of gangrene or wounds from poor circulation.  No need to start a medication at this time.  He will let us know if anything changes.  Doubt peripheral vascular disease given good pulses and lack of claudication symptoms. Pt voiced understanding and agreement to the plan.  [place], DO 03/08/21 4:03 PM

## 2021-03-14 ENCOUNTER — Telehealth: Payer: Self-pay | Admitting: Family Medicine

## 2021-03-14 NOTE — Telephone Encounter (Signed)
Monitor for a few more days, send message or call Wed AM if no improvement or worsening. Ty.

## 2021-03-14 NOTE — Telephone Encounter (Signed)
Informed the patient of PCP instructions. He agreed to to do. He was doing a little better this afternoon.

## 2021-03-14 NOTE — Telephone Encounter (Signed)
Patient seen by Nani Ravens on 03/08/2021, Patient states allergies are better but now he is coughing up yellow mucus , please advise

## 2021-03-22 ENCOUNTER — Ambulatory Visit (AMBULATORY_SURGERY_CENTER): Payer: Self-pay | Admitting: Gastroenterology

## 2021-03-22 ENCOUNTER — Other Ambulatory Visit: Payer: Self-pay

## 2021-03-22 ENCOUNTER — Encounter: Payer: Self-pay | Admitting: Gastroenterology

## 2021-03-22 VITALS — BP 136/71 | HR 69 | Temp 97.5°F | Resp 12 | Ht 68.0 in | Wt 265.0 lb

## 2021-03-22 DIAGNOSIS — Z86010 Personal history of colonic polyps: Secondary | ICD-10-CM

## 2021-03-22 DIAGNOSIS — D120 Benign neoplasm of cecum: Secondary | ICD-10-CM

## 2021-03-22 DIAGNOSIS — K635 Polyp of colon: Secondary | ICD-10-CM

## 2021-03-22 DIAGNOSIS — D128 Benign neoplasm of rectum: Secondary | ICD-10-CM

## 2021-03-22 DIAGNOSIS — Z7902 Long term (current) use of antithrombotics/antiplatelets: Secondary | ICD-10-CM

## 2021-03-22 DIAGNOSIS — K621 Rectal polyp: Secondary | ICD-10-CM

## 2021-03-22 DIAGNOSIS — D124 Benign neoplasm of descending colon: Secondary | ICD-10-CM

## 2021-03-22 MED ORDER — SODIUM CHLORIDE 0.9 % IV SOLN
500.0000 mL | Freq: Once | INTRAVENOUS | Status: DC
Start: 2021-03-22 — End: 2021-03-22

## 2021-03-22 MED ORDER — DEXTROSE 5 % IV SOLN: INTRAVENOUS | Status: DC | PRN

## 2021-03-22 NOTE — Op Note (Signed)
[facility] Patient Name: Ian [name] Procedure Date: 03/22/2021 2:06 PM MRN: [id number] Endoscopist: Thornton Park MD, MD Age: 60 Referring MD:  Date of Birth: 12-Mar-1961 Gender: Male Account #: [id number] Procedure:                Colonoscopy Indications:              Surveillance: Personal history of adenomatous                            polyps on last colonoscopy > 5 years ago                           - tubular adenoma and hyperplastic polyp on                            colonoscopy 2012                           - no polyps on colonoscopy 2015                           - surveillance recommended in 5 years                           Incidentally: Recent bright red blood per rectum                           Hemorrhoidectomy, fissurectomy and internal                            sphincterotomy 06/24/13                           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 The patient's tolerance of                            previous anesthesia was also reviewed. The risks                            and benefits of the procedure and the sedation                            options and risks were discussed with the patient.  All questions were answered, and informed consent                            was obtained. Prior Anticoagulants: The patient has                            taken Xarelto (rivaroxaban), last dose was 5 days                            prior to procedure. ASA Grade Assessment: III - A                            patient with severe systemic disease. After                            reviewing the risks and benefits, the patient was                             deemed in satisfactory condition to undergo the                            procedure.                           After obtaining informed consent, the colonoscope                            was passed under direct vision. Throughout the                            procedure, the patient's blood pressure, pulse, and                            oxygen saturations were monitored continuously. The                            Olympus PFC-[id number] (#[id number]) Colonoscope was                            introduced through the anus and advanced to the 2                            cm into the ileum. The colonoscopy was performed                            without difficulty. The patient tolerated the                            procedure well. The quality of the bowel                            preparation was good. The terminal ileum, ileocecal  valve, appendiceal orifice, and rectum were                            photographed. Scope In: 2:16:40 PM Scope Out: 2:31:23 PM Scope Withdrawal Time: 0 hours 11 minutes 40 seconds  Total Procedure Duration: 0 hours 14 minutes 43 seconds  Findings:                 The perianal and digital rectal examinations were                            normal.                           Non-bleeding internal hemorrhoids were found.                           Three sessile polyps were found in the rectum,                            descending colon and cecum. The polyps were 1 mm in                            size. These polyps were removed with a cold biopsy                            forceps. Resection and retrieval were complete.                            Estimated blood loss was minimal.                           The exam was otherwise without abnormality on                            direct and retroflexion views. Complications:            No immediate complications. Estimated blood loss:                             Minimal. Estimated Blood Loss:     Estimated blood loss was minimal. Impression:               - Non-bleeding internal hemorrhoids.                           - Three 1 mm polyps in the rectum, in the                            descending colon and in the cecum, removed with a                            cold biopsy forceps. Resected and retrieved.                           - The examination was otherwise normal on direct  and retroflexion views. Recommendation:           - Patient has a contact number available for                            emergencies. The signs and symptoms of potential                            delayed complications were discussed with the                            patient. Return to normal activities tomorrow.                            Written discharge instructions were provided to the                            patient.                           - Resume previous diet.                           - Continue present medications.                           - Resume Xarelto tomorrow.                           - Await pathology results.                           - Repeat colonoscopy date to be determined after                            pending pathology results are reviewed for                            surveillance.                           - Emerging evidence supports eating a diet of                            fruits, vegetables, grains, calcium, and yogurt                            while reducing red meat and alcohol may reduce the                            risk of colon cancer.                           - Thank you for allowing me to be involved in your                            colon cancer prevention.  Thornton Park MD, MD 03/22/2021 2:38:33 PM This report has been signed electronically.

## 2021-03-22 NOTE — Progress Notes (Signed)
Called to room to assist during endoscopic procedure.  Patient ID and intended procedure confirmed with present staff. Received instructions for my participation in the procedure from the performing physician.  

## 2021-03-22 NOTE — Patient Instructions (Signed)
Handouts given for polyps, hemorrhoids and hemorrhoid banding.  Await pathology results.  Resume Xarelto tomorrow.  YOU HAD AN ENDOSCOPIC PROCEDURE TODAY AT [facility]:   Refer to the procedure report that was given to you for any specific questions about what was found during the examination.  If the procedure report does not answer your questions, please call your gastroenterologist to clarify.  If you requested that your care partner not be given the details of your procedure findings, then the procedure report has been included in a sealed envelope for you to review at your convenience later.  YOU SHOULD EXPECT: Some feelings of bloating in the abdomen. Passage of more gas than usual.  Walking can help get rid of the air that was put into your GI tract during the procedure and reduce the bloating. If you had a lower endoscopy (such as a colonoscopy or flexible sigmoidoscopy) you may notice spotting of blood in your stool or on the toilet paper. If you underwent a bowel prep for your procedure, you may not have a normal bowel movement for a few days.  Please Note:  You might notice some irritation and congestion in your nose or some drainage.  This is from the oxygen used during your procedure.  There is no need for concern and it should clear up in a day or so.  SYMPTOMS TO REPORT IMMEDIATELY:   Following lower endoscopy (colonoscopy or flexible sigmoidoscopy):  Excessive amounts of blood in the stool  Significant tenderness or worsening of abdominal pains  Swelling of the abdomen that is new, acute  Fever of 100F or higher  For urgent or emergent issues, a gastroenterologist can be reached at any hour by calling [phone]. Do not use MyChart messaging for urgent concerns.    DIET:  We do recommend a small meal at first, but then you may proceed to your regular diet.  Drink plenty of fluids but you should avoid alcoholic beverages for 24 hours.  ACTIVITY:  You  should plan to take it easy for the rest of today and you should NOT DRIVE or use heavy machinery until tomorrow (because of the sedation medicines used during the test).    FOLLOW UP: Our staff will call the number listed on your records 48-72 hours following your procedure to check on you and address any questions or concerns that you may have regarding the information given to you following your procedure. If we do not reach you, we will leave a message.  We will attempt to reach you two times.  During this call, we will ask if you have developed any symptoms of COVID 19. If you develop any symptoms (ie: fever, flu-like symptoms, shortness of breath, cough etc.) before then, please call [phone].  If you test positive for Covid 19 in the 2 weeks post procedure, please call and report this information to [place].    If any biopsies were taken you will be contacted by phone or by letter within the next 1-3 weeks.  Please call us at [phone] if you have not heard about the biopsies in 3 weeks.    SIGNATURES/CONFIDENTIALITY: You and/or your care partner have signed paperwork which will be entered into your electronic medical record.  These signatures attest to the fact that that the information above on your After Visit Summary has been reviewed and is understood.  Full responsibility of the confidentiality of this discharge information lies with you and/or your care-partner.

## 2021-03-22 NOTE — Progress Notes (Signed)
pt tolerated well. VSS. awake and to recovery. Report given to RN.  

## 2021-03-22 NOTE — Progress Notes (Signed)
Pt's states no medical or surgical changes since previsit or office visit.  Vs MO   D5W started at 1340 x 15 mins, then CBG went up to 97, stopped D5W, pt feeling fine no symptoms

## 2021-03-24 ENCOUNTER — Telehealth: Payer: Self-pay | Admitting: *Deleted

## 2021-03-24 ENCOUNTER — Telehealth: Payer: Self-pay

## 2021-03-24 NOTE — Telephone Encounter (Signed)
NO ANSWER, MESSAGE LEFT FOR PATIENT. 

## 2021-03-24 NOTE — Telephone Encounter (Signed)
1. Have you developed a fever since your procedure? no  2.   Have you had an respiratory symptoms (SOB or cough) since your procedure? no  3.   Have you tested positive for COVID 19 since your procedure no  4.   Have you had any family members/close contacts diagnosed with the COVID 19 since your procedure?  no   If yes to any of these questions please route to Joylene John, RN and Joella Prince, RN Follow up Call-  Call back number 03/22/2021  Post procedure Call Back phone  # [phone]  Permission to leave phone message Yes  Some recent data might be hidden     Patient questions:  Do you have a fever, pain , or abdominal swelling? No. Pain Score  0 *  Have you tolerated food without any problems? Yes.    Have you been able to return to your normal activities? Yes.    Do you have any questions about your discharge instructions: Diet   No. Medications  No. Follow up visit  No.  Do you have questions or concerns about your Care? No.  Actions: * If pain score is 4 or above: No action needed, pain <4.

## 2021-04-04 ENCOUNTER — Ambulatory Visit: Payer: Self-pay | Admitting: Family Medicine

## 2021-04-13 ENCOUNTER — Encounter: Payer: Self-pay | Admitting: Gastroenterology

## 2021-04-22 ENCOUNTER — Other Ambulatory Visit: Payer: Self-pay | Admitting: Family Medicine

## 2021-04-22 ENCOUNTER — Other Ambulatory Visit: Payer: Self-pay

## 2021-04-22 ENCOUNTER — Ambulatory Visit (INDEPENDENT_AMBULATORY_CARE_PROVIDER_SITE_OTHER): Payer: Self-pay | Admitting: Family Medicine

## 2021-04-22 ENCOUNTER — Encounter: Payer: Self-pay | Admitting: Family Medicine

## 2021-04-22 VITALS — BP 120/82 | HR 83 | Temp 98.3°F | Ht 68.0 in | Wt 253.5 lb

## 2021-04-22 DIAGNOSIS — K219 Gastro-esophageal reflux disease without esophagitis: Secondary | ICD-10-CM

## 2021-04-22 DIAGNOSIS — R748 Abnormal levels of other serum enzymes: Secondary | ICD-10-CM

## 2021-04-22 DIAGNOSIS — R040 Epistaxis: Secondary | ICD-10-CM

## 2021-04-22 LAB — CK: Total CK: 683 U/L — ABNORMAL HIGH (ref 7–232)

## 2021-04-22 MED ORDER — OMEPRAZOLE 40 MG PO CPDR: 40.0000 mg | DELAYED_RELEASE_CAPSULE | Freq: Two times a day (BID) | ORAL | 2 refills | Status: DC

## 2021-04-22 NOTE — Progress Notes (Signed)
Chief Complaint  Patient presents with  . check labs today  . Medication Problem    Nose bleeds     Subjective: Patient is a 60 y.o. male here for f/u.  He is here with his wife.  Pt is in trial for elevated TG's. Labs showed CK of 426 w nml range being 25-210. He does have aches and works as a Development worker, international aid. He is on crestor and fenofibrate. Urine is clear w yellow tint, tries to stay hydrated. No injuries or falls.   Nosebleeds Started TAO and Vaseline for nosebleeds, has not had once since then. Denies trauma.   GERD Changed from Prilosec to Protonix.  He reports worsening symptoms including belching and fullness.  No nausea, vomiting, bleeding, unexplained weight loss.  He would like to switch back to Prilosec.  He is not taking any H2 blockers.  Past Medical History:  Diagnosis Date  . Allergy   . Arthritis   . Biceps tendon rupture    bilateral  . Clostridium difficile infection 2014  . Colon polyps   . Diabetes ([place])    Borderline/Pre-diabetes  . GERD (gastroesophageal reflux disease)   . History of DVT (deep vein thrombosis)   . History of pulmonary embolism   . Hyperlipidemia   . Hypertension   . Internal bleeding hemorrhoids 2012   "might bleed once/month; only when I strain"  . Internal prolapsed hemorrhoids   . Leg cramps   . Neuromuscular disorder ([facility])    leg cramps   . Testicular hypofunction   . Wart of face    Right cheek    Objective: BP 120/82 (BP Location: Left Arm, Patient Position: Sitting, Cuff Size: Normal)   Pulse 83   Temp 98.3 F (36.8 C) (Oral)   Ht 5\' 8"  (1.727 m)   Wt 253 lb 8 oz (115 kg)   SpO2 96%   BMI 38.54 kg/m  General: Awake, appears stated age HEENT: Nares patent, no varicosities or scabbing noted, no active bleeding Heart: RRR, no lower extremity edema Lungs: CTAB, no rales, wheezes or rhonchi. No accessory muscle use Abdomen: Bowel sounds present, soft, nontender, nondistended Psych: Age appropriate judgment and insight,  normal affect and mood  Assessment and Plan: Epistaxis  Elevated creatine kinase - Plan: CK (Creatine Kinase)  Gastroesophageal reflux disease without esophagitis  1.  Reassurance for now.  Restart triple antibiotic ointment and Vaseline if he starts having nosebleeds again.  Air humidifier is recommended. 2.  Likely multifactorial.  Highly doubt rhabdo.  Will recheck, stay hydrated.  If elevated, would have him stop Crestor for couple weeks and recheck. 3.  Chronic, uncontrolled.  Change back to Prilosec 40 mg twice daily from Protonix.  Consider adding Pepcid 20 mg twice daily if not controlled.  If this still is not helpful, he will follow-up with the gastroenterologist. Follow-up pending the above. The patient voiced understanding and agreement to the plan.  [place], DO 04/22/21  9:44 AM

## 2021-04-22 NOTE — Patient Instructions (Addendum)
Give [place] 2-3 business days to get the results of your labs back.   Consider adding Pepcid/famotidine to your Prilosec to help with your reflux symptoms. If this does not help, please reach out to Dr. Tarri Glenn.  The only lifestyle changes that have data behind them are weight loss for the overweight/obese and elevating the head of the bed. Finding out which foods/positions are triggers is important.  Your nose looks good now.   Let us know if you need anything.

## 2021-05-10 ENCOUNTER — Other Ambulatory Visit (INDEPENDENT_AMBULATORY_CARE_PROVIDER_SITE_OTHER): Payer: Self-pay

## 2021-05-10 ENCOUNTER — Other Ambulatory Visit: Payer: Self-pay

## 2021-05-10 DIAGNOSIS — R748 Abnormal levels of other serum enzymes: Secondary | ICD-10-CM

## 2021-05-10 DIAGNOSIS — E782 Mixed hyperlipidemia: Secondary | ICD-10-CM

## 2021-05-10 LAB — LIPID PANEL
Cholesterol: 190 mg/dL (ref 0–200)
HDL: 33.4 mg/dL — ABNORMAL LOW (ref >39.00)
Total CHOL/HDL Ratio: 6
Triglycerides: 405 mg/dL — ABNORMAL HIGH (ref 0.0–149.0)

## 2021-05-10 LAB — BASIC METABOLIC PANEL
BUN: 18 mg/dL (ref 6–23)
CO2: 30 mEq/L (ref 19–32)
Calcium: 9.6 mg/dL (ref 8.4–10.5)
Chloride: 102 mEq/L (ref 96–112)
Creatinine, Ser: 1.05 mg/dL (ref 0.40–1.50)
GFR: 76.53 mL/min (ref >60.00)
Glucose, Bld: 113 mg/dL — ABNORMAL HIGH (ref 70–99)
Potassium: 3.5 mEq/L (ref 3.5–5.1)
Sodium: 139 mEq/L (ref 135–145)

## 2021-05-10 LAB — CK: Total CK: 181 U/L (ref 7–232)

## 2021-05-10 LAB — LDL CHOLESTEROL, DIRECT: Direct LDL: 120 mg/dL

## 2021-05-19 ENCOUNTER — Other Ambulatory Visit: Payer: Self-pay | Admitting: Family Medicine

## 2021-06-01 ENCOUNTER — Telehealth: Payer: Self-pay | Admitting: Family Medicine

## 2021-06-01 NOTE — Telephone Encounter (Signed)
Patient sent my chart message requesting a sample of Trulicity. PCP did give him one box 0.75 mg included 2 pens. Patient instructed to take weekly. Patient instructed to come by the office to pickup sample. Documented in Log book amount given/amount left.

## 2021-06-12 ENCOUNTER — Other Ambulatory Visit: Payer: Self-pay | Admitting: Family Medicine

## 2021-07-28 ENCOUNTER — Other Ambulatory Visit: Payer: Self-pay | Admitting: Family Medicine

## 2021-07-29 ENCOUNTER — Telehealth: Payer: Self-pay | Admitting: Family Medicine

## 2021-07-29 NOTE — Telephone Encounter (Signed)
Patient given samples of Xarelto 20 mg. #2 bottle's, #7 tablets each bottle. 2 week supply  Also Given Trulicity1.'5mg'$ /0.15m #1 box with 2 single dose each. 2 week supply Will give the patient a LProduction managerpatient assistance for Trulicity and JWynetta Emeryand JDelta Air Linesfor xAutoZoneform to complete  Will be in frig by nurses station. Called left message to call back.

## 2021-07-29 NOTE — Telephone Encounter (Signed)
Called the patient informed ready for pickup. Did explain to complete the patient assistance forms asap and get back to [place] we then can do our part and get faxed to the companies. The patient verbalized understanding.

## 2021-08-01 ENCOUNTER — Telehealth: Payer: Self-pay

## 2021-08-01 ENCOUNTER — Other Ambulatory Visit: Payer: Self-pay | Admitting: Family Medicine

## 2021-08-01 NOTE — Telephone Encounter (Signed)
PCP complete both forms and they have both been faxed

## 2021-08-01 NOTE — Telephone Encounter (Signed)
New message    Forms drop off for MD to review and sign

## 2021-08-08 NOTE — Telephone Encounter (Signed)
Received letter from [facility] today 08/08/2021 The patient has been approved for a 12 month period Patient informed

## 2021-08-10 ENCOUNTER — Telehealth: Payer: Self-pay | Admitting: Family Medicine

## 2021-08-10 NOTE — Telephone Encounter (Signed)
Patient called today 08/10/21 that [employer] called him to let him know his first shipment of Trulicity will be shipped to our office on 08/18/2021.

## 2021-08-10 NOTE — Telephone Encounter (Signed)
Patient called to request a sample of Trulicity to cover until his patient assistance comes in. Patient will be given one box 1.5/0.79m #1 box with 2 single dose each, for a 2 week supply. PCP did ok sample given. Patient informed sample is ready for pickup. He did inform Trulicity patient assistance will be shipped to our office Thursday [id number] Trulicity.

## 2021-08-11 ENCOUNTER — Other Ambulatory Visit: Payer: Self-pay | Admitting: Family Medicine

## 2021-08-11 DIAGNOSIS — E1165 Type 2 diabetes mellitus with hyperglycemia: Secondary | ICD-10-CM

## 2021-08-15 ENCOUNTER — Ambulatory Visit (INDEPENDENT_AMBULATORY_CARE_PROVIDER_SITE_OTHER): Payer: Self-pay | Admitting: Family Medicine

## 2021-08-15 ENCOUNTER — Other Ambulatory Visit: Payer: Self-pay

## 2021-08-15 ENCOUNTER — Encounter: Payer: Self-pay | Admitting: Family Medicine

## 2021-08-15 VITALS — BP 128/86 | HR 85 | Temp 98.3°F | Ht 68.0 in | Wt 248.1 lb

## 2021-08-15 DIAGNOSIS — I10 Essential (primary) hypertension: Secondary | ICD-10-CM

## 2021-08-15 DIAGNOSIS — E1169 Type 2 diabetes mellitus with other specified complication: Secondary | ICD-10-CM

## 2021-08-15 DIAGNOSIS — E669 Obesity, unspecified: Secondary | ICD-10-CM

## 2021-08-15 DIAGNOSIS — E781 Pure hyperglyceridemia: Secondary | ICD-10-CM

## 2021-08-15 LAB — COMPREHENSIVE METABOLIC PANEL
ALT: 27 U/L (ref 0–53)
AST: 23 U/L (ref 0–37)
Albumin: 4.2 g/dL (ref 3.5–5.2)
Alkaline Phosphatase: 41 U/L (ref 39–117)
BUN: 11 mg/dL (ref 6–23)
CO2: 30 mEq/L (ref 19–32)
Calcium: 10 mg/dL (ref 8.4–10.5)
Chloride: 102 mEq/L (ref 96–112)
Creatinine, Ser: 1.06 mg/dL (ref 0.40–1.50)
GFR: 75.52 mL/min (ref >60.00)
Glucose, Bld: 50 mg/dL — ABNORMAL LOW (ref 70–99)
Potassium: 3.7 mEq/L (ref 3.5–5.1)
Sodium: 142 mEq/L (ref 135–145)
Total Bilirubin: 0.7 mg/dL (ref 0.2–1.2)
Total Protein: 7 g/dL (ref 6.0–8.3)

## 2021-08-15 LAB — LIPID PANEL
Cholesterol: 194 mg/dL (ref 0–200)
HDL: 38.8 mg/dL — ABNORMAL LOW (ref >39.00)
LDL Cholesterol: 137 mg/dL — ABNORMAL HIGH (ref 0–99)
NonHDL: 155.62
Total CHOL/HDL Ratio: 5
Triglycerides: 91 mg/dL (ref 0.0–149.0)
VLDL: 18.2 mg/dL (ref 0.0–40.0)

## 2021-08-15 LAB — HEMOGLOBIN A1C: Hgb A1c MFr Bld: 6.4 % (ref 4.6–6.5)

## 2021-08-15 MED ORDER — OMEPRAZOLE 40 MG PO CPDR: 40.0000 mg | DELAYED_RELEASE_CAPSULE | Freq: Two times a day (BID) | ORAL | 2 refills | Status: DC

## 2021-08-15 MED ORDER — LISINOPRIL 20 MG PO TABS: 20.0000 mg | ORAL_TABLET | Freq: Every day | ORAL | 3 refills | Status: DC

## 2021-08-15 MED ORDER — RIVAROXABAN 20 MG PO TABS: 20.0000 mg | ORAL_TABLET | Freq: Every day | ORAL | 1 refills | Status: DC

## 2021-08-15 NOTE — Patient Instructions (Signed)
Give us 2-3 business days to get the results of your labs back.   Keep the diet clean and stay active.  I recommend getting the flu shot in mid October. This suggestion would change if the CDC comes out with a different recommendation.   Let us know if you need anything. 

## 2021-08-15 NOTE — Progress Notes (Signed)
Subjective:   Chief Complaint  Patient presents with   Follow-up    6 month followup    Ian [name] is a 60 y.o. male here for follow-up of diabetes.   Sota's self monitored glucose range is 70-90's.  Patient denies hypoglycemic reactions. He checks his glucose levels 1 time(s) per day. Patient does not require insulin.   Medications include: Actos 30 mg/d, glyburide 5 mg bid Diet is fair.  Exercise: Landscaping  Hypertension Patient presents for hypertension follow up. He does not monitor home blood pressures. He is compliant with medication- HCTZ 25 mg/d.  Patient has these side effects of medication: none Diet/exercise as above.   Mixed Hyperlipidemia Patient presents for mixed hyperlipidemia follow up. Currently being treated with Crestor 20 mg/d, Tricor 145 mg/d, Zetia 10 mg/d and compliance with treatment thus far has been good. He denies myalgias. Diet/exercise as above. The patient is not known to have coexisting coronary artery disease.   Past Medical History:  Diagnosis Date   Allergy    Arthritis    Biceps tendon rupture    bilateral   Clostridium difficile infection 2014   Colon polyps    Diabetes ([place])    Borderline/Pre-diabetes   GERD (gastroesophageal reflux disease)    History of DVT (deep vein thrombosis)    History of pulmonary embolism    Hyperlipidemia    Hypertension    Internal bleeding hemorrhoids 2012   "might bleed once/month; only when I strain"   Internal prolapsed hemorrhoids    Leg cramps    Neuromuscular disorder (HCC)    leg cramps    Testicular hypofunction    Wart of face    Right cheek     Related testing: Retinal exam: Due, but has no insurance Pneumovax: done  Objective:  BP 128/86   Pulse 85   Temp 98.3 F (36.8 C) (Oral)   Ht '5\' 8"'$  (1.727 m)   Wt 248 lb 2 oz (112.5 kg)   SpO2 96%   BMI 37.73 kg/m  General:  Well developed, well nourished, in no apparent distress Skin:  Warm, no pallor or  diaphoresis Head:  Normocephalic, atraumatic Eyes:  Pupils equal and round, sclera anicteric without injection  Lungs:  CTAB, no access msc use Cardio:  RRR, no bruits, no LE edema Musculoskeletal:  Symmetrical muscle groups noted without atrophy or deformity Neuro:  Sensation intact to pinprick on feet Psych: Age appropriate judgment and insight  Assessment:   Diabetes mellitus type 2 in obese ([place]) - Plan: Comprehensive metabolic panel, Lipid panel, Hemoglobin A1c  Essential hypertension  Hypertriglyceridemia   Plan:   Chronic, stable. May need to take off Actos depending on A1c. Counseled on diet and exercise. Flu shot rec'd for mid Oct. He does not have ins, will hold off on ophtho exam. Chronic, stable. Change HCTZ to lisinopril 20 mg. He will monitor BP and if elevated will let me know. Cont Zetia 10 mg/d, Tricor 145 mg/d, Crestor 20 mg/d.  F/u in 6 mo. The patient voiced understanding and agreement to the plan.  [place], DO 08/15/21 12:08 PM

## 2021-08-18 ENCOUNTER — Telehealth: Payer: Self-pay

## 2021-08-18 NOTE — Telephone Encounter (Signed)
Pt is aware that his pt assistance has arrived. He says his wife will come by to pick it up.

## 2021-08-30 ENCOUNTER — Other Ambulatory Visit: Payer: Self-pay | Admitting: Family Medicine

## 2021-08-30 MED ORDER — LOSARTAN POTASSIUM 50 MG PO TABS: 50.0000 mg | ORAL_TABLET | Freq: Every day | ORAL | 2 refills | Status: DC

## 2021-10-17 ENCOUNTER — Other Ambulatory Visit: Payer: Self-pay | Admitting: Family Medicine

## 2021-11-06 ENCOUNTER — Other Ambulatory Visit: Payer: Self-pay | Admitting: Family Medicine

## 2021-11-26 ENCOUNTER — Other Ambulatory Visit: Payer: Self-pay | Admitting: Family Medicine

## 2021-11-29 ENCOUNTER — Encounter: Payer: Self-pay | Admitting: Family Medicine

## 2021-11-29 ENCOUNTER — Ambulatory Visit (INDEPENDENT_AMBULATORY_CARE_PROVIDER_SITE_OTHER): Payer: Self-pay | Admitting: Family Medicine

## 2021-11-29 VITALS — BP 120/68 | HR 63 | Temp 98.4°F | Ht 68.0 in | Wt 259.0 lb

## 2021-11-29 DIAGNOSIS — E1169 Type 2 diabetes mellitus with other specified complication: Secondary | ICD-10-CM

## 2021-11-29 DIAGNOSIS — T50905A Adverse effect of unspecified drugs, medicaments and biological substances, initial encounter: Secondary | ICD-10-CM

## 2021-11-29 DIAGNOSIS — M545 Low back pain, unspecified: Secondary | ICD-10-CM

## 2021-11-29 DIAGNOSIS — E669 Obesity, unspecified: Secondary | ICD-10-CM

## 2021-11-29 MED ORDER — HYDROCODONE-ACETAMINOPHEN 5-325 MG PO TABS: 1.0000 | ORAL_TABLET | Freq: Three times a day (TID) | ORAL | 0 refills | Status: DC | PRN

## 2021-11-29 NOTE — Patient Instructions (Addendum)
Let's stop the glyburide.   Send me some sugar readings in the next 2-3 weeks.   Keep the diet clean and stay active.  Heat (pad or rice pillow in microwave) over affected area, 10-15 minutes twice daily.   Ice/cold pack over area for 10-15 min twice daily.  Let us know if you need anything.  EXERCISES  RANGE OF MOTION (ROM) AND STRETCHING EXERCISES - Low Back Pain Most people with lower back pain will find that their symptoms get worse with excessive bending forward (flexion) or arching at the lower back (extension). The exercises that will help resolve your symptoms will focus on the opposite motion.  If you have pain, numbness or tingling which travels down into your buttocks, leg or foot, the goal of the therapy is for these symptoms to move closer to your back and eventually resolve. Sometimes, these leg symptoms will get better, but your lower back pain may worsen. This is often an indication of progress in your rehabilitation. Be very alert to any changes in your symptoms and the activities in which you participated in the 24 hours prior to the change. Sharing this information with your caregiver will allow him or her to most efficiently treat your condition. These exercises may help you when beginning to rehabilitate your injury. Your symptoms may resolve with or without further involvement from your physician, physical therapist or athletic trainer. While completing these exercises, remember:  Restoring tissue flexibility helps normal motion to return to the joints. This allows healthier, less painful movement and activity. An effective stretch should be held for at least 30 seconds. A stretch should never be painful. You should only feel a gentle lengthening or release in the stretched tissue. FLEXION RANGE OF MOTION AND STRETCHING EXERCISES:  STRETCH - Flexion, Single Knee to Chest  Lie on a firm bed or floor with both legs extended in front of you. Keeping one leg in contact with  the floor, bring your opposite knee to your chest. Hold your leg in place by either grabbing behind your thigh or at your knee. Pull until you feel a gentle stretch in your low back. Hold 30 seconds. Slowly release your grasp and repeat the exercise with the opposite side. Repeat 2 times. Complete this exercise 3 times per week.   STRETCH - Flexion, Double Knee to Chest Lie on a firm bed or floor with both legs extended in front of you. Keeping one leg in contact with the floor, bring your opposite knee to your chest. Tense your stomach muscles to support your back and then lift your other knee to your chest. Hold your legs in place by either grabbing behind your thighs or at your knees. Pull both knees toward your chest until you feel a gentle stretch in your low back. Hold 30 seconds. Tense your stomach muscles and slowly return one leg at a time to the floor. Repeat 2 times. Complete this exercise 3 times per week.   STRETCH - Low Trunk Rotation Lie on a firm bed or floor. Keeping your legs in front of you, bend your knees so they are both pointed toward the ceiling and your feet are flat on the floor. Extend your arms out to the side. This will stabilize your upper body by keeping your shoulders in contact with the floor. Gently and slowly drop both knees together to one side until you feel a gentle stretch in your low back. Hold for 30 seconds. Tense your stomach muscles to support  your lower back as you bring your knees back to the starting position. Repeat the exercise to the other side. Repeat 2 times. Complete this exercise at least 3 times per week.   EXTENSION RANGE OF MOTION AND FLEXIBILITY EXERCISES:  STRETCH - Extension, Prone on Elbows  Lie on your stomach on the floor, a bed will be too soft. Place your palms about shoulder width apart and at the height of your head. Place your elbows under your shoulders. If this is too painful, stack pillows under your chest. Allow your  body to relax so that your hips drop lower and make contact more completely with the floor. Hold this position for 30 seconds. Slowly return to lying flat on the floor. Repeat 2 times. Complete this exercise 3 times per week.   RANGE OF MOTION - Extension, Prone Press Ups Lie on your stomach on the floor, a bed will be too soft. Place your palms about shoulder width apart and at the height of your head. Keeping your back as relaxed as possible, slowly straighten your elbows while keeping your hips on the floor. You may adjust the placement of your hands to maximize your comfort. As you gain motion, your hands will come more underneath your shoulders. Hold this position 30 seconds. Slowly return to lying flat on the floor. Repeat 2 times. Complete this exercise 3 times per week.   RANGE OF MOTION- Quadruped, Neutral Spine  Assume a hands and knees position on a firm surface. Keep your hands under your shoulders and your knees under your hips. You may place padding under your knees for comfort. Drop your head and point your tailbone toward the ground below you. This will round out your lower back like an angry cat. Hold this position for 30 seconds. Slowly lift your head and release your tail bone so that your back sags into a large arch, like an old horse. Hold this position for 30 seconds. Repeat this until you feel limber in your low back. Now, find your "sweet spot." This will be the most comfortable position somewhere between the two previous positions. This is your neutral spine. Once you have found this position, tense your stomach muscles to support your low back. Hold this position for 30 seconds. Repeat 2 times. Complete this exercise 3 times per week.   STRENGTHENING EXERCISES - Low Back Sprain These exercises may help you when beginning to rehabilitate your injury. These exercises should be done near your "sweet spot." This is the neutral, low-back arch, somewhere between fully  rounded and fully arched, that is your least painful position. When performed in this safe range of motion, these exercises can be used for people who have either a flexion or extension based injury. These exercises may resolve your symptoms with or without further involvement from your physician, physical therapist or athletic trainer. While completing these exercises, remember:  Muscles can gain both the endurance and the strength needed for everyday activities through controlled exercises. Complete these exercises as instructed by your physician, physical therapist or athletic trainer. Increase the resistance and repetitions only as guided. You may experience muscle soreness or fatigue, but the pain or discomfort you are trying to eliminate should never worsen during these exercises. If this pain does worsen, stop and make certain you are following the directions exactly. If the pain is still present after adjustments, discontinue the exercise until you can discuss the trouble with your caregiver.  STRENGTHENING - Deep Abdominals, Pelvic Tilt  Ian [name]  on a firm bed or floor. Keeping your legs in front of you, bend your knees so they are both pointed toward the ceiling and your feet are flat on the floor. Tense your lower abdominal muscles to press your low back into the floor. This motion will rotate your pelvis so that your tail bone is scooping upwards rather than pointing at your feet or into the floor. With a gentle tension and even breathing, hold this position for 3 seconds. Repeat 2 times. Complete this exercise 3 times per week.   STRENGTHENING - Abdominals, Crunches  Lie on a firm bed or floor. Keeping your legs in front of you, bend your knees so they are both pointed toward the ceiling and your feet are flat on the floor. Cross your arms over your chest. Slightly tip your chin down without bending your neck. Tense your abdominals and slowly lift your trunk high enough to just clear your  shoulder blades. Lifting higher can put excessive stress on the lower back and does not further strengthen your abdominal muscles. Control your return to the starting position. Repeat 2 times. Complete this exercise 3 times per week.   STRENGTHENING - Quadruped, Opposite UE/LE Lift  Assume a hands and knees position on a firm surface. Keep your hands under your shoulders and your knees under your hips. You may place padding under your knees for comfort. Find your neutral spine and gently tense your abdominal muscles so that you can maintain this position. Your shoulders and hips should form a rectangle that is parallel with the floor and is not twisted. Keeping your trunk steady, lift your right hand no higher than your shoulder and then your left leg no higher than your hip. Make sure you are not holding your breath. Hold this position for 30 seconds. Continuing to keep your abdominal muscles tense and your back steady, slowly return to your starting position. Repeat with the opposite arm and leg. Repeat 2 times. Complete this exercise 3 times per week.   STRENGTHENING - Abdominals and Quadriceps, Straight Leg Raise  Lie on a firm bed or floor with both legs extended in front of you. Keeping one leg in contact with the floor, bend the other knee so that your foot can rest flat on the floor. Find your neutral spine, and tense your abdominal muscles to maintain your spinal position throughout the exercise. Slowly lift your straight leg off the floor about 6 inches for a count of 3, making sure to not hold your breath. Still keeping your neutral spine, slowly lower your leg all the way to the floor. Repeat this exercise with each leg 2 times. Complete this exercise 3 times per week.  POSTURE AND BODY MECHANICS CONSIDERATIONS - Low Back Sprain Keeping correct posture when sitting, standing or completing your activities will reduce the stress put on different body tissues, allowing injured tissues a  chance to heal and limiting painful experiences. The following are general guidelines for improved posture.  While reading these guidelines, remember: The exercises prescribed by your provider will help you have the flexibility and strength to maintain correct postures. The correct posture provides the best environment for your joints to work. All of your joints have less wear and tear when properly supported by a spine with good posture. This means you will experience a healthier, less painful body. Correct posture must be practiced with all of your activities, especially prolonged sitting and standing. Correct posture is as important when doing repetitive low-stress  activities (typing) as it is when doing a single heavy-load activity (lifting).  RESTING POSITIONS Consider which positions are most painful for you when choosing a resting position. If you have pain with flexion-based activities (sitting, bending, stooping, squatting), choose a position that allows you to rest in a less flexed posture. You would want to avoid curling into a fetal position on your side. If your pain worsens with extension-based activities (prolonged standing, working overhead), avoid resting in an extended position such as sleeping on your stomach. Most people will find more comfort when they rest with their spine in a more neutral position, neither too rounded nor too arched. Lying on a non-sagging bed on your side with a pillow between your knees, or on your back with a pillow under your knees will often provide some relief. Keep in mind, being in any one position for a prolonged period of time, no matter how correct your posture, can still lead to stiffness.  PROPER SITTING POSTURE In order to minimize stress and discomfort on your spine, you must sit with correct posture. Sitting with good posture should be effortless for a healthy body. Returning to good posture is a gradual process. Many people can work toward this most  comfortably by using various supports until they have the flexibility and strength to maintain this posture on their own. When sitting with proper posture, your ears will fall over your shoulders and your shoulders will fall over your hips. You should use the back of the chair to support your upper back. Your lower back will be in a neutral position, just slightly arched. You may place a small pillow or folded towel at the base of your lower back for  support.  When working at a desk, create an environment that supports good, upright posture. Without extra support, muscles tire, which leads to excessive strain on joints and other tissues. Keep these recommendations in mind:  CHAIR: A chair should be able to slide under your desk when your back makes contact with the back of the chair. This allows you to work closely. The chair's height should allow your eyes to be level with the upper part of your monitor and your hands to be slightly lower than your elbows.  BODY POSITION Your feet should make contact with the floor. If this is not possible, use a foot rest. Keep your ears over your shoulders. This will reduce stress on your neck and low back.  INCORRECT SITTING POSTURES  If you are feeling tired and unable to assume a healthy sitting posture, do not slouch or slump. This puts excessive strain on your back tissues, causing more damage and pain. Healthier options include: Using more support, like a lumbar pillow. Switching tasks to something that requires you to be upright or walking. Talking a brief walk. Lying down to rest in a neutral-spine position.  PROLONGED STANDING WHILE SLIGHTLY LEANING FORWARD  When completing a task that requires you to lean forward while standing in one place for a long time, place either foot up on a stationary 2-4 inch high object to help maintain the best posture. When both feet are on the ground, the lower back tends to lose its slight inward curve. If this curve  flattens (or becomes too large), then the back and your other joints will experience too much stress, tire more quickly, and can cause pain.  CORRECT STANDING POSTURES Proper standing posture should be assumed with all daily activities, even if they only take a  few moments, like when brushing your teeth. As in sitting, your ears should fall over your shoulders and your shoulders should fall over your hips. You should keep a slight tension in your abdominal muscles to brace your spine. Your tailbone should point down to the ground, not behind your body, resulting in an over-extended swayback posture.   INCORRECT STANDING POSTURES  Common incorrect standing postures include a forward head, locked knees and/or an excessive swayback. WALKING Walk with an upright posture. Your ears, shoulders and hips should all line-up.  PROLONGED ACTIVITY IN A FLEXED POSITION When completing a task that requires you to bend forward at your waist or lean over a low surface, try to find a way to stabilize 3 out of 4 of your limbs. You can place a hand or elbow on your thigh or rest a knee on the surface you are reaching across. This will provide you more stability, so that your muscles do not tire as quickly. By keeping your knees relaxed, or slightly bent, you will also reduce stress across your lower back. CORRECT LIFTING TECHNIQUES  DO : Assume a wide stance. This will provide you more stability and the opportunity to get as close as possible to the object which you are lifting. Tense your abdominals to brace your spine. Bend at the knees and hips. Keeping your back locked in a neutral-spine position, lift using your leg muscles. Lift with your legs, keeping your back straight. Test the weight of unknown objects before attempting to lift them. Try to keep your elbows locked down at your sides in order get the best strength from your shoulders when carrying an object.   Always ask for help when lifting heavy or  awkward objects. INCORRECT LIFTING TECHNIQUES DO NOT:  Lock your knees when lifting, even if it is a small object. Bend and twist. Pivot at your feet or move your feet when needing to change directions. Assume that you can safely pick up even a paperclip without proper posture.

## 2021-11-29 NOTE — Progress Notes (Signed)
Chief Complaint  Patient presents with   Follow-up    Subjective: Patient is a 60 y.o. male here for f/u.  Patient has a history of diabetes and does not do well with metformin due to GI side effects.  Over the past 4 months, has been having intermittent bouts of diarrhea and sometimes incontinence.  He thought he saw something stating that glyburide is the same thing as metformin.  He cut down on this and has noticed fewer low sugar readings in addition to better bowel movements.  He is compliant with Trulicity and Actos 30 mg daily.  2 weeks ago he was lifting up a pack while working.  He felt a tweak in his right lower back.  He has been taking leftover hydrocodone every other day and is requesting a refill.  He also has leftover muscle relaxers which have been slightly helpful.  He is not doing any rehab.  No bowel or bladder incontinence, no neurologic signs or symptoms.  Past Medical History:  Diagnosis Date   Allergy    Arthritis    Biceps tendon rupture    bilateral   Clostridium difficile infection 2014   Colon polyps    Diabetes ([place])    Borderline/Pre-diabetes   GERD (gastroesophageal reflux disease)    History of DVT (deep vein thrombosis)    History of pulmonary embolism    Hyperlipidemia    Hypertension    Internal bleeding hemorrhoids 2012   "might bleed once/month; only when I strain"   Internal prolapsed hemorrhoids    Leg cramps    Neuromuscular disorder ([facility])    leg cramps    Testicular hypofunction    Wart of face    Right cheek    Objective: BP 120/68   Pulse 63   Temp 98.4 F (36.9 C) (Oral)   Ht 5\' 8"  (1.727 m)   Wt 259 lb (117.5 kg)   SpO2 99%   BMI 39.38 kg/m  General: Awake, appears stated age Heart: RRR MSK: Mild tenderness to palpation over the lateral paraspinal musculature in the lumbar spine in addition to the erector spinae group on the right.  Poor hamstring range of motion.   Neuro: DTRs equal and symmetric throughout without  clonus, no cerebellar signs, gait mildly antalgic Lungs: CTAB, no rales, wheezes or rhonchi. No accessory muscle use Psych: Age appropriate judgment and insight, normal affect and mood  Assessment and Plan: Diabetes mellitus type 2 in obese (HCC)  Adverse effect of drug, initial encounter  Acute right-sided low back pain without sciatica - Plan: HYDROcodone-acetaminophen (NORCO/VICODIN) 5-325 MG tablet  Chronic problem, adverse effect of the medication.  He will simply stopped the glyburide.  He will monitor sugars at home.  He will send me a message in the next 2-3 weeks with readings.  We may need to increase his Actos or substitute a different medication. As above. Short course of hydrocodone sent, heat, ice, stretches and exercises.  He is on Xarelto so anti-inflammatories are not his best option. The patient voiced understanding and agreement to the plan.  [place], DO 11/29/21  2:35 PM

## 2021-12-13 ENCOUNTER — Telehealth: Payer: Self-pay | Admitting: Family Medicine

## 2021-12-13 NOTE — Telephone Encounter (Signed)
Pt stated that he contacted patient assistance 2 or 3 weeks ago regarding his trulicity medication, and they told him it would be shipped to his doctors office. Since it has to be refrigerated. Please advise.

## 2021-12-13 NOTE — Telephone Encounter (Signed)
Called the patient and his patient assistance will arrive tomorrow to our office. Will call the patient to pickup once it has arrived

## 2021-12-15 ENCOUNTER — Encounter: Payer: Self-pay | Admitting: Family Medicine

## 2022-02-03 ENCOUNTER — Other Ambulatory Visit: Payer: Self-pay | Admitting: Family Medicine

## 2022-02-03 DIAGNOSIS — E1165 Type 2 diabetes mellitus with hyperglycemia: Secondary | ICD-10-CM

## 2022-02-16 ENCOUNTER — Ambulatory Visit (INDEPENDENT_AMBULATORY_CARE_PROVIDER_SITE_OTHER): Payer: Self-pay | Admitting: Family Medicine

## 2022-02-16 ENCOUNTER — Encounter: Payer: Self-pay | Admitting: Family Medicine

## 2022-02-16 VITALS — BP 120/72 | HR 82 | Temp 97.6°F | Ht 68.0 in | Wt 246.1 lb

## 2022-02-16 DIAGNOSIS — E1169 Type 2 diabetes mellitus with other specified complication: Secondary | ICD-10-CM

## 2022-02-16 DIAGNOSIS — Z125 Encounter for screening for malignant neoplasm of prostate: Secondary | ICD-10-CM

## 2022-02-16 DIAGNOSIS — Q899 Congenital malformation, unspecified: Secondary | ICD-10-CM

## 2022-02-16 DIAGNOSIS — I10 Essential (primary) hypertension: Secondary | ICD-10-CM

## 2022-02-16 DIAGNOSIS — E669 Obesity, unspecified: Secondary | ICD-10-CM

## 2022-02-16 NOTE — Progress Notes (Signed)
Subjective:   Chief Complaint  Patient presents with   Follow-up    Ian [name] is a 61 y.o. male here for follow-up of diabetes.   Gaetano's self monitored glucose range is 80-90's.  Patient denies hypoglycemic reactions. He checks his glucose levels 1 time(s) per day. Patient does not require insulin.   Medications include: Actos 30 mg daily Diet is healthy.  Exercise: walking 1 hr daily.  Hypertension Patient presents for hypertension follow up. He does not monitor home blood pressures. He is compliant with medications-hydrochlorothiazide 25 mg daily, losartan 50 mg daily. Patient has these side effects of medication: none He is adhering to a healthy diet overall. Exercise: Diet and exercise as above No chest pain or shortness of breath  Bellybutton Over the past several weeks, the patient has been having some irritation, redness, and foul odor coming from his bellybutton.  This is happened in the past but will resolve on its own.  He admits his bellybutton is deep.  He has been using salt water cleans, alcohol, and regular soap/water.  Past Medical History:  Diagnosis Date   Allergy    Arthritis    Biceps tendon rupture    bilateral   Clostridium difficile infection 2014   Colon polyps    Diabetes ([place])    Borderline/Pre-diabetes   GERD (gastroesophageal reflux disease)    History of DVT (deep vein thrombosis)    History of pulmonary embolism    Hyperlipidemia    Hypertension    Internal bleeding hemorrhoids 2012   "might bleed once/month; only when I strain"   Internal prolapsed hemorrhoids    Leg cramps    Neuromuscular disorder (HCC)    leg cramps    Testicular hypofunction    Wart of face    Right cheek     Related testing: Retinal exam: Due, self-pay Pneumovax: Done  Objective:  BP 120/72    Pulse 82    Temp 97.6 F (36.4 C) (Oral)    Ht 5\' 8"  (1.727 m)    Wt 246 lb 2 oz (111.6 kg)    SpO2 99%    BMI 37.42 kg/m  General:  Well developed, well  nourished, in no apparent distress Skin: Bellybutton is deep, I do not appreciate the floor of it.  It may be overlying abdominal fascia.  There is a slightly pink hue of the tissue around the bellybutton without tenderness or warmth.  I do not appreciate any foul odor or drainage. Lungs:  CTAB, no access msc use Cardio:  RRR, no bruits Psych: Age appropriate judgment and insight  Assessment:   Diabetes mellitus type 2 in obese ([place]) - Plan: Lipid panel, Hemoglobin A1c, Comprehensive metabolic panel  Essential hypertension  Umbilical abnormality  Screening PSA (prostate specific antigen) - Plan: PSA   Plan:   Chronic, stable.  Continue Actos 30 mg daily.  Check A1c today.  Continue statin.  Counseled on diet and exercise. Chronic, stable. Cont losartan 50 mg/d, HCTZ 25 mg/d.  I think he has a defect at base of umbilicus exposing abd fascia. He has no ins and would prefer not to see a specialist or have a procedure at this time. Will ck w a general surgeon to ensure nothing urgent arises.  F/u in 6 mo or prn. The patient voiced understanding and agreement to the plan.  [place], DO 02/16/22 4:21 PM

## 2022-02-16 NOTE — Patient Instructions (Addendum)
Give [place] 2-3 business days to get the results of your labs back.   Keep the diet clean and stay active.  I will reach out to my colleague regarding the urgency of the belly button region.   Let us know if you need anything.

## 2022-02-17 LAB — COMPREHENSIVE METABOLIC PANEL
ALT: 28 U/L (ref 0–53)
AST: 27 U/L (ref 0–37)
Albumin: 4.4 g/dL (ref 3.5–5.2)
Alkaline Phosphatase: 51 U/L (ref 39–117)
BUN: 16 mg/dL (ref 6–23)
CO2: 31 mEq/L (ref 19–32)
Calcium: 10.3 mg/dL (ref 8.4–10.5)
Chloride: 105 mEq/L (ref 96–112)
Creatinine, Ser: 1.2 mg/dL (ref 0.40–1.50)
GFR: 64.84 mL/min (ref >60.00)
Glucose, Bld: 101 mg/dL — ABNORMAL HIGH (ref 70–99)
Potassium: 4.5 mEq/L (ref 3.5–5.1)
Sodium: 140 mEq/L (ref 135–145)
Total Bilirubin: 0.8 mg/dL (ref 0.2–1.2)
Total Protein: 6.8 g/dL (ref 6.0–8.3)

## 2022-02-17 LAB — LIPID PANEL
Cholesterol: 195 mg/dL (ref 0–200)
HDL: 39.8 mg/dL (ref >39.00)
LDL Cholesterol: 131 mg/dL — ABNORMAL HIGH (ref 0–99)
NonHDL: 155.03
Total CHOL/HDL Ratio: 5
Triglycerides: 118 mg/dL (ref 0.0–149.0)
VLDL: 23.6 mg/dL (ref 0.0–40.0)

## 2022-02-17 LAB — PSA: PSA: 1.01 ng/mL (ref 0.10–4.00)

## 2022-02-17 LAB — HEMOGLOBIN A1C: Hgb A1c MFr Bld: 6.2 % (ref 4.6–6.5)

## 2022-02-20 ENCOUNTER — Ambulatory Visit: Payer: Self-pay | Admitting: Family Medicine

## 2022-02-22 ENCOUNTER — Other Ambulatory Visit: Payer: Self-pay | Admitting: Family Medicine

## 2022-03-30 ENCOUNTER — Telehealth: Payer: Self-pay | Admitting: Family Medicine

## 2022-03-30 MED ORDER — MONTELUKAST SODIUM 10 MG PO TABS: 10.0000 mg | ORAL_TABLET | Freq: Every day | ORAL | 3 refills | Status: DC

## 2022-03-30 NOTE — Telephone Encounter (Signed)
Needs to stay on PO antihistamine like Allegra and steroid nasal spray like Flonase. Sent in another medicine to help. Will see him Mon if no improvement.  ?

## 2022-03-30 NOTE — Telephone Encounter (Signed)
Called Pt and Pt was advised of Rx   ?

## 2022-03-30 NOTE — Telephone Encounter (Signed)
Pt is having a seasonal allergy flare up, and wanted to come in today. As there were no opening offered to look at other locations, but pt has to work, and works outside. Please advise if something can be sent in to help him through the weekend.  ? ?[id number] - [place], [place], [place] [address]  ?Phone:  [phone]  Fax:  [phone]  ?

## 2022-04-05 ENCOUNTER — Other Ambulatory Visit: Payer: Self-pay | Admitting: Family Medicine

## 2022-04-05 DIAGNOSIS — R058 Other specified cough: Secondary | ICD-10-CM

## 2022-04-26 ENCOUNTER — Other Ambulatory Visit: Payer: Self-pay | Admitting: Family Medicine

## 2022-04-26 MED ORDER — TRULICITY 1.5 MG/0.5ML ~~LOC~~ SOAJ: 1.5000 mg | SUBCUTANEOUS | 3 refills | Status: DC

## 2022-04-26 NOTE — Telephone Encounter (Signed)
The patient called needing [place] to send into [facility] for a 4 month supply. ?Sent as requested by the patient ?This request is for Patient assitance. ? ?

## 2022-05-01 ENCOUNTER — Other Ambulatory Visit: Payer: Self-pay | Admitting: Family Medicine

## 2022-05-20 ENCOUNTER — Other Ambulatory Visit: Payer: Self-pay | Admitting: Family Medicine

## 2022-05-23 ENCOUNTER — Ambulatory Visit (INDEPENDENT_AMBULATORY_CARE_PROVIDER_SITE_OTHER): Payer: Self-pay | Admitting: Family Medicine

## 2022-05-23 ENCOUNTER — Encounter: Payer: Self-pay | Admitting: Family Medicine

## 2022-05-23 VITALS — BP 120/88 | HR 63 | Temp 97.0°F | Ht 68.0 in | Wt 242.0 lb

## 2022-05-23 DIAGNOSIS — E1169 Type 2 diabetes mellitus with other specified complication: Secondary | ICD-10-CM

## 2022-05-23 DIAGNOSIS — R0683 Snoring: Secondary | ICD-10-CM

## 2022-05-23 DIAGNOSIS — E669 Obesity, unspecified: Secondary | ICD-10-CM

## 2022-05-23 DIAGNOSIS — R5383 Other fatigue: Secondary | ICD-10-CM

## 2022-05-23 NOTE — Progress Notes (Signed)
Chief Complaint  Patient presents with   blood sugar and BP problems    Subjective: Patient is a 61 y.o. male here for BS problems. Here w wife.   Patient has been without his Trulicity over the past few weeks due to a mixup at the pharmacy.  He had leftover glyburide that he takes when he is on prednisone.  He has been taking half a tab and having low readings in the 40s to 60s.  He feels extremely low energy.  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  Eating is around the same.  He could eat healthier during lunch but does fast food more often than he should.  Last A1c 6 months ago was 6.2.  He is now back on the Trulicity 1.5 mg weekly and still having some fatigue.  He sleeps ~ 8 to 9 hours feeling unrefreshed when he wakes up and also is reported to snore.  He will fall asleep quickly if given the  opportunity.  He is unwilling to have a sleep study at this time.  He has some extra stress/anxiety due to his wife needing an upcoming procedure in 2 months.  Reports mood is sound otherwise.  Past Medical History:  Diagnosis Date   Allergy    Arthritis    Biceps tendon rupture    bilateral   Clostridium difficile infection 2014   Colon polyps    Diabetes ([place])    Borderline/Pre-diabetes   GERD (gastroesophageal reflux disease)    History of DVT (deep vein thrombosis)    History of pulmonary embolism    Hyperlipidemia    Hypertension    Internal bleeding hemorrhoids 2012   "might bleed once/month; only when I strain"   Internal prolapsed hemorrhoids    Leg cramps    Neuromuscular disorder (HCC)    leg cramps    Testicular hypofunction    Wart of face    Right cheek    Objective: BP 120/88   Pulse 63   Temp (!) 97 F (36.1 C) (Oral)   Ht '5\' 8"'$  (1.727 m)   Wt 242 lb (109.8 kg)   SpO2 97%   BMI 36.80 kg/m  General: Awake, appears stated age Heart: RRR, no LE edema Lungs: CTAB, no rales, wheezes or  rhonchi. No accessory muscle use Psych: Age appropriate judgment and insight, normal affect and mood  Assessment and Plan: Diabetes mellitus type 2 in obese (HCC)  Snoring  Fatigue, unspecified type - Plan: TSH  Adverse effect of medication used to treat chronic issue.  Stop glyburide.  Continue Actos 30 mg daily.  Hold next 2 doses of Trulicity.  Send a message in 1 week to let me know how he did.  Counseled on diet and exercise. For his snoring and fatigue, recommended sleep study which she politely declined at this time.  We will check a thyroid level and see how he does managing hypoglycemia moving forward.  Follow-up pending his progress. The patient and his spouse voiced understanding and agreement to the plan.  [place], DO 05/23/22  4:30 PM

## 2022-05-23 NOTE — Patient Instructions (Signed)
Keep the diet clean and stay active.  No more glyburide.  Hold your Trulicity for the next 2 doses. Send me a message next week with your sugar readings and with how you are doing.   Check out a thumb spica splint.  Continue to monitor sugars.

## 2022-05-24 LAB — TSH: TSH: 1.24 u[IU]/mL (ref 0.35–5.50)

## 2022-05-30 ENCOUNTER — Encounter: Payer: Self-pay | Admitting: Family Medicine

## 2022-05-31 ENCOUNTER — Other Ambulatory Visit: Payer: Self-pay | Admitting: Family Medicine

## 2022-06-19 ENCOUNTER — Other Ambulatory Visit: Payer: Self-pay | Admitting: Family Medicine

## 2022-06-23 ENCOUNTER — Telehealth: Payer: Self-pay | Admitting: Family Medicine

## 2022-06-23 NOTE — Telephone Encounter (Signed)
Received Ian [name] and [name] Patient assistance form . The patient will pickup the form/complete the financial information/return to [place] to complete our part and will then be faxed. Form at front desk.

## 2022-06-26 ENCOUNTER — Telehealth: Payer: Self-pay | Admitting: Family Medicine

## 2022-06-26 NOTE — Telephone Encounter (Signed)
PCP completed His wife will pickup at the front desk.

## 2022-06-26 NOTE — Telephone Encounter (Signed)
Pt dropped off document to be filled out Education officer, community - 6 pages- Yellow envelope) Pt would like document to be faxed when ready. Document put at front office tray under providers name.

## 2022-07-11 NOTE — Telephone Encounter (Signed)
Had to refax the form and include patients DOB on script section as was not included Faxed to [phone]--OVFIEPP ID#  [id number] Patient is aware.

## 2022-08-14 ENCOUNTER — Ambulatory Visit (INDEPENDENT_AMBULATORY_CARE_PROVIDER_SITE_OTHER): Payer: Self-pay | Admitting: Family Medicine

## 2022-08-14 ENCOUNTER — Encounter: Payer: Self-pay | Admitting: Family Medicine

## 2022-08-14 VITALS — BP 130/69 | HR 67 | Temp 98.3°F | Ht 68.0 in | Wt 246.4 lb

## 2022-08-14 DIAGNOSIS — E669 Obesity, unspecified: Secondary | ICD-10-CM

## 2022-08-14 DIAGNOSIS — E1169 Type 2 diabetes mellitus with other specified complication: Secondary | ICD-10-CM

## 2022-08-14 DIAGNOSIS — I10 Essential (primary) hypertension: Secondary | ICD-10-CM

## 2022-08-14 DIAGNOSIS — E782 Mixed hyperlipidemia: Secondary | ICD-10-CM

## 2022-08-14 LAB — COMPREHENSIVE METABOLIC PANEL
ALT: 29 U/L (ref 0–53)
AST: 26 U/L (ref 0–37)
Albumin: 4.1 g/dL (ref 3.5–5.2)
Alkaline Phosphatase: 47 U/L (ref 39–117)
BUN: 13 mg/dL (ref 6–23)
CO2: 27 mEq/L (ref 19–32)
Calcium: 9.4 mg/dL (ref 8.4–10.5)
Chloride: 106 mEq/L (ref 96–112)
Creatinine, Ser: 1.09 mg/dL (ref 0.40–1.50)
GFR: 72.52 mL/min (ref >60.00)
Glucose, Bld: 92 mg/dL (ref 70–99)
Potassium: 4.7 mEq/L (ref 3.5–5.1)
Sodium: 140 mEq/L (ref 135–145)
Total Bilirubin: 0.8 mg/dL (ref 0.2–1.2)
Total Protein: 6.5 g/dL (ref 6.0–8.3)

## 2022-08-14 LAB — LIPID PANEL
Cholesterol: 177 mg/dL (ref 0–200)
HDL: 34.6 mg/dL — ABNORMAL LOW (ref >39.00)
LDL Cholesterol: 126 mg/dL — ABNORMAL HIGH (ref 0–99)
NonHDL: 142.26
Total CHOL/HDL Ratio: 5
Triglycerides: 83 mg/dL (ref 0.0–149.0)
VLDL: 16.6 mg/dL (ref 0.0–40.0)

## 2022-08-14 LAB — HEMOGLOBIN A1C: Hgb A1c MFr Bld: 6.6 % — ABNORMAL HIGH (ref 4.6–6.5)

## 2022-08-14 NOTE — Progress Notes (Signed)
Subjective:   Chief Complaint  Patient presents with   Follow-up    6 month     Ian [name] is a 61 y.o. male here for follow-up of diabetes.   Ki's self monitored glucose range is 80-90's.  Patient denies hypoglycemic reactions. He checks his glucose levels 1 time(s) per day. Patient does not require insulin.   Medications include: Trulicity 1.5 mg/d Diet is healthy overall.  Exercise: active at work, landscaper  Hypertension Patient presents for hypertension follow up. He does not monitor home blood pressures. He is compliant with medications- losartan 50 mg/d. Patient has these side effects of medication: none No Cp or SOB.   Mixed hyperlipidemia Patient presents for mixed hyperlipidemia follow up. Currently being treated with Zetia 10 mg/d, Tricor 145 mg/d and compliance with treatment thus far has been good. He denies myalgias. He is statin intolerant. Diet/exercise.  The patient is known to have coexisting coronary artery disease.   Past Medical History:  Diagnosis Date   Allergy    Arthritis    Biceps tendon rupture    bilateral   Clostridium difficile infection 2014   Colon polyps    Diabetes ([place])    Borderline/Pre-diabetes   GERD (gastroesophageal reflux disease)    History of DVT (deep vein thrombosis)    History of pulmonary embolism    Hyperlipidemia    Hypertension    Internal bleeding hemorrhoids 2012   "might bleed once/month; only when I strain"   Internal prolapsed hemorrhoids    Leg cramps    Neuromuscular disorder (HCC)    leg cramps    Testicular hypofunction    Wart of face    Right cheek     Related testing: Retinal exam: Due, cannot afford at this time Pneumovax: done  Objective:  BP 130/69   Pulse 67   Temp 98.3 F (36.8 C) (Oral)   Ht '5\' 8"'$  (1.727 m)   Wt 246 lb 6 oz (111.8 kg)   SpO2 98%   BMI 37.46 kg/m  General:  Well developed, well nourished, in no apparent distress Lungs:  CTAB, no access msc use Cardio:   RRR, no bruits, no LE edema Skin: No ext lesions noted on feet Neuro: Sensation intact to pinprick on b/l feet Psych: Age appropriate judgment and insight  Assessment:   Diabetes mellitus type 2 in obese ([place]) - Plan: Comprehensive metabolic panel, Lipid panel, Hemoglobin A1c, Microalbumin / creatinine urine ratio  Essential hypertension  Mixed hyperlipidemia   Plan:   Chronic, stable. Cont Trulicity 1.5 mg/week. Counseled on diet and exercise. Chronic, stable. Cont losartan 50 mg/d.  Chronic, stable. Cont Zetia 10 mg/d, Tricor 145 mg/d.  F/u in 6 mo. The patient voiced understanding and agreement to the plan.  [place], DO 08/14/22 11:18 AM

## 2022-08-14 NOTE — Addendum Note (Signed)
Addended by: Manuela Schwartz on: 08/14/2022 11:26 AM   Modules accepted: Orders

## 2022-08-14 NOTE — Patient Instructions (Signed)
Give us 2-3 business days to get the results of your labs back.   Keep the diet clean and stay active.  I recommend getting the flu shot in mid October. This suggestion would change if the CDC comes out with a different recommendation.   Let us know if you need anything. 

## 2022-08-18 ENCOUNTER — Other Ambulatory Visit: Payer: Self-pay | Admitting: Family Medicine

## 2022-08-18 MED ORDER — LOSARTAN POTASSIUM 50 MG PO TABS: 50.0000 mg | ORAL_TABLET | Freq: Every day | ORAL | 0 refills | Status: DC

## 2022-08-25 ENCOUNTER — Ambulatory Visit (INDEPENDENT_AMBULATORY_CARE_PROVIDER_SITE_OTHER): Payer: Self-pay | Admitting: Family Medicine

## 2022-08-25 ENCOUNTER — Encounter: Payer: Self-pay | Admitting: Family Medicine

## 2022-08-25 VITALS — BP 118/76 | HR 85 | Temp 98.3°F | Ht 68.0 in | Wt 244.0 lb

## 2022-08-25 DIAGNOSIS — H6592 Unspecified nonsuppurative otitis media, left ear: Secondary | ICD-10-CM

## 2022-08-25 MED ORDER — BUDESONIDE 32 MCG/ACT NA SUSP: 2.0000 | Freq: Every day | NASAL | 11 refills | Status: DC

## 2022-08-25 MED ORDER — METHYLPREDNISOLONE ACETATE 80 MG/ML IJ SUSP
80.0000 mg | Freq: Once | INTRAMUSCULAR | Status: AC
  Administered 2022-08-25: 80 mg via INTRAMUSCULAR

## 2022-08-25 NOTE — Progress Notes (Signed)
Chief Complaint  Patient presents with   Dizziness    Ian [name] is 61 y.o. pt here for dizziness.  Duration: 1 day, intermittent.  No a/w head movement.  Pass out? No Spinning? Yes Recent illness/fever? No Headache? Not accompanying the spinning Neurologic signs? No Change in PO intake? No Hx of DM, ate a candy bar that did not help.  +L ear pain/fullness.  Palpitations? No   Past Medical History:  Diagnosis Date   Allergy    Arthritis    Biceps tendon rupture    bilateral   Clostridium difficile infection 2014   Colon polyps    Diabetes ([place])    Borderline/Pre-diabetes   GERD (gastroesophageal reflux disease)    History of DVT (deep vein thrombosis)    History of pulmonary embolism    Hyperlipidemia    Hypertension    Internal bleeding hemorrhoids 2012   "might bleed once/month; only when I strain"   Internal prolapsed hemorrhoids    Leg cramps    Neuromuscular disorder (HCC)    leg cramps    Testicular hypofunction    Wart of face    Right cheek    Family History  Problem Relation Age of Onset   Heart disease Mother 20       Deceased   Hyperlipidemia Mother    Hypertension Mother    Heart disease Father 59       Deceased   CVA Father    Hypertension Maternal Grandmother    Hyperlipidemia Maternal Grandmother    Cancer Maternal Grandmother        Colon Cancer   Colon cancer Maternal Grandmother    Colon polyps Maternal Grandmother    Heart disease Maternal Grandfather    Hypertension Maternal Grandfather    Hyperlipidemia Maternal Grandfather    Cancer Maternal Grandfather        Lung Cancer   Heart attack Maternal Grandfather    Heart disease Paternal Grandfather    Stomach cancer Maternal Great-grandfather    Esophageal cancer Neg Hx    Rectal cancer Neg Hx      BP 118/76   Pulse 85   Temp 98.3 F (36.8 C) (Oral)   Ht '5\' 8"'$  (1.727 m)   Wt 244 lb (110.7 kg)   SpO2 98%   BMI 37.10 kg/m  General: Awake, alert, appears stated  age Eyes: PERRLA, EOMi Ears: Patent, TM neg on R, serous effusion on L Heart: RRR, no murmurs, no carotid bruits Lungs: CTAB, no accessory muscle use MSK: 5/5 strength throughout, gait normal Neuro: No cerebellar signs, patellar reflex 1/4 on R, 0/4 on L (chronic) wo clonus, calcaneal reflex 0/4 b/l wo clonus, biceps reflex 1/4 b/l wo clonus; Dix-Hall-Pike negative b/l. Psych: Age appropriate judgment and insight, normal mood and affect  Middle ear effusion, left - Plan: budesonide (RHINOCORT AQUA) 32 MCG/ACT nasal spray  Depo 80 mg IM today. INCS. Stay on PO antihist. ENT referral if no better.  F/u prn. Pt voiced understanding and agreement to the plan.  [place], DO 08/25/22 11:11 AM

## 2022-08-25 NOTE — Addendum Note (Signed)
Addended by: Sharon Seller B on: 08/25/2022 11:35 AM   Modules accepted: Orders

## 2022-08-25 NOTE — Patient Instructions (Signed)
Stay hydrated.  Let us know if you need anything.

## 2022-09-14 ENCOUNTER — Other Ambulatory Visit: Payer: Self-pay | Admitting: Family Medicine

## 2022-09-18 ENCOUNTER — Telehealth (INDEPENDENT_AMBULATORY_CARE_PROVIDER_SITE_OTHER): Payer: Self-pay | Admitting: Family Medicine

## 2022-09-18 ENCOUNTER — Encounter: Payer: Self-pay | Admitting: Family Medicine

## 2022-09-18 VITALS — BP 91/64 | HR 84 | Temp 98.1°F

## 2022-09-18 DIAGNOSIS — U071 COVID-19: Secondary | ICD-10-CM

## 2022-09-18 MED ORDER — NIRMATRELVIR/RITONAVIR (PAXLOVID)TABLET: 3.0000 | ORAL_TABLET | Freq: Two times a day (BID) | ORAL | 0 refills | Status: AC

## 2022-09-18 MED ORDER — MOMETASONE FUROATE 50 MCG/ACT NA SUSP: 2.0000 | Freq: Every day | NASAL | 12 refills | Status: AC

## 2022-09-18 NOTE — Patient Instructions (Signed)

## 2022-09-18 NOTE — Assessment & Plan Note (Signed)
Antiviral sent in nasonex And con't antiviral  If symptoms worsen rto or call Pt will get oximeter to check O2 as well

## 2022-09-18 NOTE — Progress Notes (Signed)
MyChart Video Visit    Virtual Visit via Video Note   This visit type was conducted due to national recommendations for restrictions regarding the COVID-19 Pandemic (e.g. social distancing) in an effort to limit this patient's exposure and mitigate transmission in our community. This patient is at least at moderate risk for complications without adequate follow up. This format is felt to be most appropriate for this patient at this time. Physical exam was limited by quality of the video and audio technology used for the visit. Ian [name] was able to get the patient set up on a video visit.  Patient location: Home Patient and provider in visit Provider location: Office  I discussed the limitations of evaluation and management by telemedicine and the availability of in person appointments. The patient expressed understanding and agreed to proceed.  Visit Date: 09/18/2022  Today's healthcare provider: Ann Held, DO     Subjective:    Patient ID: Ian [name], male    DOB: [birth year], 61 y.o.   MRN: [id number]  Chief Complaint  Patient presents with   Covid Positive    Pt tested positve for COVID on 09/24.    HPI Patient is in today for a virtual office visit. He is accompanied by his partner.  He reports that he tested positive on 09/17/2022 for Covid-19. He has a dry cough, drainage, and body aches. Symptoms appeared on 09/15/2022. On 09/16/2022, his temperatures were normal however on 09/17/2022 around 19:40 his temperature ran to 103.4. His symptoms were accompanied with chills. He is currently taking Tylenol and compression. This morning temperatures was around 98. His blood pressure is about 91/64 with a pulse of 84 which is lower than normal for him. His blood pressure would occasionally drop that low when he is inactive. His sugars are 117 mg/dL but a normal range is 105 - 110 mg/dL. Sore throat appeared on the night of 09/15/2022 and resolved the next morning. He  denies of any SOB. He takes 1-2 Ibuprofen's a day. He sleeps for a few hours before waking up. He is not sure why he is waking up. His partner states that she does not hear cough continuously and only occasionally. He is requesting a prescription of nasonex. He takes 180 Mg of Allegra daily.   Past Medical History:  Diagnosis Date   Allergy    Arthritis    Biceps tendon rupture    bilateral   Clostridium difficile infection 2014   Colon polyps    Diabetes ([place])    Borderline/Pre-diabetes   GERD (gastroesophageal reflux disease)    History of DVT (deep vein thrombosis)    History of pulmonary embolism    Hyperlipidemia    Hypertension    Internal bleeding hemorrhoids 2012   "might bleed once/month; only when I strain"   Internal prolapsed hemorrhoids    Leg cramps    Neuromuscular disorder (HCC)    leg cramps    Testicular hypofunction    Wart of face    Right cheek    Past Surgical History:  Procedure Laterality Date   ANAL FISSURE REPAIR  06/24/2013   APPENDECTOMY  2011   BACK SURGERY     COLONOSCOPY W/ BIOPSIES AND POLYPECTOMY  2012   COLONSCOPY  2015   EXCISIONAL HEMORRHOIDECTOMY     FISSURECTOMY     HEMORRHOID SURGERY  06/24/2013   internal sphincterotomy  06-24-2013   LUMBAR LAMINECTOMY/DECOMPRESSION MICRODISCECTOMY N/A 01/20/2015   Procedure: MICRO LUMBAR DECOMPRESSION L4-5/L3-4/L2-3;  Surgeon: Johnn Hai, MD;  Location: [facility];  Service: Orthopedics;  Laterality: N/A;   QUADRICEPS TENDON REPAIR Left 05/13/2020   Procedure: REPAIR QUADRICEP TENDON;  Surgeon: Susa Day, MD;  Location: [facility];  Service: Orthopedics;  Laterality: Left;   SHOULDER ARTHROSCOPY  08/22/2012   Procedure: ARTHROSCOPY SHOULDER;  Surgeon: Marin Shutter, MD;  Location: [facility];  Service: Orthopedics;  Laterality: Left;  Left shoulder arthroscopic lavage and debridement   SHOULDER ARTHROSCOPY  09/02/2012   Procedure: ARTHROSCOPY SHOULDER;  Surgeon: Marin Shutter, MD;  Location: [facility];   Service: Orthopedics;  Laterality: Left;  Left shoulder arthroscopy irrigation and debridement   SHOULDER ARTHROSCOPY W/ ROTATOR CUFF REPAIR  07/09/2012   left   SHOULDER OPEN ROTATOR CUFF REPAIR Right 12/13/2017   Procedure: Right shoulder mini open rotator cuff repair;  Surgeon: Susa Day, MD;  Location: [facility];  Service: Orthopedics;  Laterality: Right;  Interscalene Block   SKIN LESION EXCISION     Benign   WISDOM TOOTH EXTRACTION  YRS AGO    Family History  Problem Relation Age of Onset   Heart disease Mother 79       Deceased   Hyperlipidemia Mother    Hypertension Mother    Heart disease Father 34       Deceased   CVA Father    Hypertension Maternal Grandmother    Hyperlipidemia Maternal Grandmother    Cancer Maternal Grandmother        Colon Cancer   Colon cancer Maternal Grandmother    Colon polyps Maternal Grandmother    Heart disease Maternal Grandfather    Hypertension Maternal Grandfather    Hyperlipidemia Maternal Grandfather    Cancer Maternal Grandfather        Lung Cancer   Heart attack Maternal Grandfather    Heart disease Paternal Grandfather    Stomach cancer Maternal Great-grandfather    Esophageal cancer Neg Hx    Rectal cancer Neg Hx     Social History   Socioeconomic History   Marital status: Married    Spouse name: Not on file   Number of children: Not on file   Years of education: Not on file   Highest education level: Not on file  Occupational History   Not on file  Tobacco Use   Smoking status: Never   Smokeless tobacco: Never   Tobacco comments:    NEVER USED TOBACCO  Vaping Use   Vaping Use: Never used  Substance and Sexual Activity   Alcohol use: No    Alcohol/week: 0.0 standard drinks of alcohol   Drug use: No   Sexual activity: Not Currently  Other Topics Concern   Not on file  Social History Narrative   Marital Status: Married Pamala Hurry)    Children:  Step Daughter Sharyn Lull)    Pets: None    Living Situation: Lives  with Pamala Hurry    Occupation: [employer] ([place])    Education: Associate's Degree in [employer]    Tobacco Use/Exposure:  None    Alcohol Use:  Rarely    Drug Use:  None   Diet:  Regular   Exercise: Limited     Hobbies:  Location manager, Insurance underwriter             Social Determinants of Radio broadcast assistant Strain: Not on file  Food Insecurity: Not on file  Transportation Needs: Not on file  Physical Activity: Not on file  Stress: Not on file  Social Connections: Not on file  Intimate Partner Violence: Not on file    Outpatient Medications Prior to Visit  Medication Sig Dispense Refill   Blood Glucose Monitoring Suppl (RELION CONFIRM GLUCOSE MONITOR) W/DEVICE KIT USE AS DIRECTED TO TEST BLOOD GLUCOSE DAILY DX: 250.00 1 kit 0   Cholecalciferol (VITAMIN D-3) 5000 UNITS TABS Take 5,000 Units by mouth daily.      cyclobenzaprine (FLEXERIL) 10 MG tablet TAKE ONE TABLET BY MOUTH THREE TIMES A DAY AS NEEDED MUSCLE SPASMS 90 tablet 1   Dulaglutide (TRULICITY) 1.5 JH/4.1DE SOPN Inject 1.5 mg into the skin once a week. 8 mL 3   EPIPEN 2-PAK 0.3 MG/0.3ML SOAJ injection Inject 0.3 mg into the skin once.     ezetimibe (ZETIA) 10 MG tablet TAKE 1 TABLET BY MOUTH DAILY 90 tablet 2   fenofibrate (TRICOR) 145 MG tablet TAKE ONE TABLET BY MOUTH DAILY 90 tablet 5   fexofenadine (ALLEGRA) 180 MG tablet Take 180 mg by mouth daily.     losartan (COZAAR) 50 MG tablet Take 1 tablet (50 mg total) by mouth daily. 90 tablet 0   Magnesium 400 MG TABS Take 800 mg by mouth daily.      omeprazole (PRILOSEC) 40 MG capsule Take 1 capsule (40 mg total) by mouth 2 (two) times daily. 180 capsule 2   potassium gluconate 595 (99 K) MG TABS tablet Take 595 mg by mouth daily.      XARELTO 20 MG TABS tablet TAKE 1 TABLET BY MOUTH DAILY WITH SUPPER 90 tablet 1   budesonide (RHINOCORT AQUA) 32 MCG/ACT nasal spray Place 2 sprays into both nostrils daily. 8.43 mL 11   No facility-administered medications prior to  visit.    Allergies  Allergen Reactions   Penicillins Rash, Other (See Comments), Diarrhea and Hives    Has patient had a PCN reaction causing immediate rash, facial/tongue/throat swelling, SOB or lightheadedness with hypotension: Unknown Has patient had a PCN reaction causing severe rash involving mucus membranes or skin necrosis: No Has patient had a PCN reaction that required hospitalization: No Has patient had a PCN reaction occurring within the last 10 years: No If all of the above answers are "NO", then may proceed with Cephalosporin use.    Tetanus Toxoid, Adsorbed Anaphylaxis   Tetanus Toxoids Anaphylaxis   Aspirin Nausea Only and Rash   Daptomycin Other (See Comments)    myalgias   Prednisone Other (See Comments) and Rash    Hyperactivity WITH ORAL, CAN TAKE SHOTS-Cannot do Dose pack   Bioflavonoids Diarrhea    All citrus fruits   Lisinopril Cough    ACEi cough   Other     Cheese   Tetanus Immune Globulin    Citrus Diarrhea and Other (See Comments)    All citrus fruits   Fenofibrate Other (See Comments)    Constipation w/anal fissure [Sx] Other reaction(s): Constipation   Milk-Related Compounds Diarrhea and Other (See Comments)    Milk and milk products   Oxycodone Other (See Comments)    Does not help pain just makes him high    Review of Systems  Constitutional:  Positive for chills, fever and malaise/fatigue.  HENT:  Negative for congestion.        (+) Nasal Drainage  Eyes:  Negative for blurred vision.  Respiratory:  Positive for cough (Dry). Negative for sputum production, shortness of breath and wheezing.   Cardiovascular:  Negative for chest pain, palpitations and leg swelling.  Gastrointestinal:  Negative for  vomiting.  Musculoskeletal:  Negative for back pain and myalgias.  Skin:  Negative for rash.  Neurological:  Negative for loss of consciousness and headaches.       Objective:    Physical Exam Vitals and nursing note reviewed.   Constitutional:      General: He is not in acute distress.    Appearance: He is well-developed. He is ill-appearing. He is not toxic-appearing or diaphoretic.  Neck:     Thyroid: No thyromegaly.  Pulmonary:     Effort: Pulmonary effort is normal.  Neurological:     Mental Status: He is alert and oriented to person, place, and time.  Psychiatric:        Mood and Affect: Mood normal.        Behavior: Behavior normal.     BP 91/64   Pulse 84   Temp 98.1 F (36.7 C)  Wt Readings from Last 3 Encounters:  08/25/22 244 lb (110.7 kg)  08/14/22 246 lb 6 oz (111.8 kg)  05/23/22 242 lb (109.8 kg)    Diabetic Foot Exam - Simple   No data filed    Lab Results  Component Value Date   WBC 9.6 05/14/2020   HGB 13.2 05/14/2020   HCT 38.2 (L) 05/14/2020   PLT 141 (L) 05/14/2020   GLUCOSE 92 08/14/2022   CHOL 177 08/14/2022   TRIG 83.0 08/14/2022   HDL 34.60 (L) 08/14/2022   LDLDIRECT 120.0 05/10/2021   LDLCALC 126 (H) 08/14/2022   ALT 29 08/14/2022   AST 26 08/14/2022   NA 140 08/14/2022   K 4.7 08/14/2022   CL 106 08/14/2022   CREATININE 1.09 08/14/2022   BUN 13 08/14/2022   CO2 27 08/14/2022   TSH 1.24 05/23/2022   PSA 1.01 02/16/2022   INR 1.1 05/10/2020   HGBA1C 6.6 (H) 08/14/2022   MICROALBUR 0.3 02/27/2020    Lab Results  Component Value Date   TSH 1.24 05/23/2022   Lab Results  Component Value Date   WBC 9.6 05/14/2020   HGB 13.2 05/14/2020   HCT 38.2 (L) 05/14/2020   MCV 93.9 05/14/2020   PLT 141 (L) 05/14/2020   Lab Results  Component Value Date   NA 140 08/14/2022   K 4.7 08/14/2022   CHLORIDE 105 04/30/2017   CO2 27 08/14/2022   GLUCOSE 92 08/14/2022   BUN 13 08/14/2022   CREATININE 1.09 08/14/2022   BILITOT 0.8 08/14/2022   ALKPHOS 47 08/14/2022   AST 26 08/14/2022   ALT 29 08/14/2022   PROT 6.5 08/14/2022   ALBUMIN 4.1 08/14/2022   CALCIUM 9.4 08/14/2022   ANIONGAP 5 05/14/2020   EGFR 75 (L) 04/30/2017   GFR 72.52 08/14/2022   Lab  Results  Component Value Date   CHOL 177 08/14/2022   Lab Results  Component Value Date   HDL 34.60 (L) 08/14/2022   Lab Results  Component Value Date   LDLCALC 126 (H) 08/14/2022   Lab Results  Component Value Date   TRIG 83.0 08/14/2022   Lab Results  Component Value Date   CHOLHDL 5 08/14/2022   Lab Results  Component Value Date   HGBA1C 6.6 (H) 08/14/2022       Assessment & Plan:   Problem List Items Addressed This Visit       Unprioritized   COVID-19 - Primary    Antiviral sent in nasonex And con't antiviral  If symptoms worsen rto or call Pt will get oximeter to check O2  as well       Relevant Medications   nirmatrelvir/ritonavir EUA (PAXLOVID) 20 x 150 MG & 10 x 100MG TABS   mometasone (NASONEX) 50 MCG/ACT nasal spray   Meds ordered this encounter  Medications   nirmatrelvir/ritonavir EUA (PAXLOVID) 20 x 150 MG & 10 x 100MG TABS    Sig: Take 3 tablets by mouth 2 (two) times daily for 5 days. (Take nirmatrelvir 150 mg two tablets twice daily for 5 days and ritonavir 100 mg one tablet twice daily for 5 days) Patient GFR is 67    Dispense:  30 tablet    Refill:  0   mometasone (NASONEX) 50 MCG/ACT nasal spray    Sig: Place 2 sprays into the nose daily.    Dispense:  1 each    Refill:  12    I discussed the assessment and treatment plan with the patient. The patient was provided an opportunity to ask questions and all were answered. The patient agreed with the plan and demonstrated an understanding of the instructions.   The patient was advised to call back or seek an in-person evaluation if the symptoms worsen or if the condition fails to improve as anticipated.  I provided 20 minutes of face-to-face time during this encounter.   I,Amber Collins,acting as a Education administrator for [employer], DO.,have documented all relevant documentation on the behalf of Ann Held, DO,as directed by  Ann Held, DO while in the presence of Ann Held, DO.   Ann Held, DO [facility] [phone] (phone) [phone] (fax)  [place]

## 2022-09-25 ENCOUNTER — Telehealth: Payer: Self-pay | Admitting: Family Medicine

## 2022-09-25 NOTE — Telephone Encounter (Signed)
Cont cough medication, Tylenol prn for aches/pains, push fluids.

## 2022-09-25 NOTE — Telephone Encounter (Signed)
Patient informed. 

## 2022-09-25 NOTE — Telephone Encounter (Signed)
Patient tested positive for covid 09/17/22. He said he has taken all of the medication he was prescribed but he is still testing positive. He still feels weak and was coughing some last night but after taking cough medicine he was able to rest. He stated that he is not coughing up anything green or yellow. It's clear. He has a slight headache to the left side that comes and goes but he is mostly feeling weak.Please call him back to advise what else he can be doing to feel better.

## 2022-09-25 NOTE — Telephone Encounter (Signed)
Called the patient informed of PCP instructions. He agreed to do so. 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 Wants to know if ok to go back, very slowly at first, is this ok?

## 2022-11-12 ENCOUNTER — Other Ambulatory Visit: Payer: Self-pay | Admitting: Family Medicine

## 2022-12-08 ENCOUNTER — Encounter: Payer: Self-pay | Admitting: Family Medicine

## 2022-12-08 NOTE — Telephone Encounter (Signed)
Error

## 2022-12-13 ENCOUNTER — Ambulatory Visit (INDEPENDENT_AMBULATORY_CARE_PROVIDER_SITE_OTHER): Payer: Self-pay | Admitting: Family Medicine

## 2022-12-13 ENCOUNTER — Encounter: Payer: Self-pay | Admitting: Family Medicine

## 2022-12-13 VITALS — BP 136/78 | HR 69 | Temp 98.1°F | Ht 68.0 in | Wt 248.5 lb

## 2022-12-13 DIAGNOSIS — S46812A Strain of other muscles, fascia and tendons at shoulder and upper arm level, left arm, initial encounter: Secondary | ICD-10-CM

## 2022-12-13 MED ORDER — CYCLOBENZAPRINE HCL 10 MG PO TABS: ORAL_TABLET | ORAL | 1 refills | Status: AC

## 2022-12-13 MED ORDER — HYDROCODONE-ACETAMINOPHEN 5-325 MG PO TABS: 1.0000 | ORAL_TABLET | Freq: Four times a day (QID) | ORAL | 0 refills | Status: DC | PRN

## 2022-12-13 NOTE — Patient Instructions (Addendum)
Heat (pad or rice pillow in microwave) over affected area, 10-15 minutes twice daily.   Ice/cold pack over area for 10-15 min twice daily.  OK to take Tylenol 1000 mg (2 extra strength tabs) or 975 mg (3 regular strength tabs) every 6 hours as needed.  Do not drink alcohol, do any illicit/street drugs, drive or do anything that requires alertness while on this medicine.   Let us know if you need anything.  Trapezius stretches/exercises Do exercises exactly as told by your health care provider and adjust them as directed. It is normal to feel mild stretching, pulling, tightness, or discomfort as you do these exercises, but you should stop right away if you feel sudden pain or your pain gets worse.   Stretching and range of motion exercises These exercises warm up your muscles and joints and improve the movement and flexibility of your shoulder. These exercises can also help to relieve pain, numbness, and tingling. If you are unable to do any of the following for any reason, do not further attempt to do it.   Exercise A: Flexion, standing     Stand and hold a broomstick, a cane, or a similar object. Place your hands a little more than shoulder-width apart on the object. Your left / right hand should be palm-up, and your other hand should be palm-down. Push the stick to raise your left / right arm out to your side and then over your head. Use your other hand to help move the stick. Stop when you feel a stretch in your shoulder, or when you reach the angle that is recommended by your health care provider. Avoid shrugging your shoulder while you raise your arm. Keep your shoulder blade tucked down toward your spine. Hold for 30 seconds. Slowly return to the starting position. Repeat 2 times. Complete this exercise 3 times per week.  Exercise B: Abduction, supine     Lie on your back and hold a broomstick, a cane, or a similar object. Place your hands a little more than shoulder-width apart on  the object. Your left / right hand should be palm-up, and your other hand should be palm-down. Push the stick to raise your left / right arm out to your side and then over your head. Use your other hand to help move the stick. Stop when you feel a stretch in your shoulder, or when you reach the angle that is recommended by your health care provider. Avoid shrugging your shoulder while you raise your arm. Keep your shoulder blade tucked down toward your spine. Hold for 30 seconds. Slowly return to the starting position. Repeat 2 times. Complete this exercise 3 times per week.  Exercise C: Flexion, active-assisted     Lie on your back. You may bend your knees for comfort. Hold a broomstick, a cane, or a similar object. Place your hands about shoulder-width apart on the object. Your palms should face toward your feet. Raise the stick and move your arms over your head and behind your head, toward the floor. Use your healthy arm to help your left / right arm move farther. Stop when you feel a gentle stretch in your shoulder, or when you reach the angle where your health care provider tells you to stop. Hold for 30 seconds. Slowly return to the starting position. Repeat 2 times. Complete this exercise 3 times per week.  Exercise D: External rotation and abduction     Stand in a door frame with one of your  feet slightly in front of the other. This is called a staggered stance. Choose one of the following positions as told by your health care provider: Place your hands and forearms on the door frame above your head. Place your hands and forearms on the door frame at the height of your head. Place your hands on the door frame at the height of your elbows. Slowly move your weight onto your front foot until you feel a stretch across your chest and in the front of your shoulders. Keep your head and chest upright and keep your abdominal muscles tight. Hold for 30 seconds. To release the stretch, shift  your weight to your back foot. Repeat 2 times. Complete this stretch 3 times per week.  Strengthening exercises These exercises build strength and endurance in your shoulder. Endurance is the ability to use your muscles for a long time, even after your muscles get tired. Exercise E: Scapular depression and adduction  Sit on a stable chair. Support your arms in front of you with pillows, armrests, or a tabletop. Keep your elbows in line with the sides of your body. Gently move your shoulder blades down toward your middle back. Relax the muscles on the tops of your shoulders and in the back of your neck. Hold for 3 seconds. Slowly release the tension and relax your muscles completely before doing this exercise again. Repeat for a total of 10 repetitions. After you have practiced this exercise, try doing the exercise without the arm support. Then, try the exercise while standing instead of sitting. Repeat 2 times. Complete this exercise 3 times per week.  Exercise F: Shoulder abduction, isometric     Stand or sit about 4-6 inches (10-15 cm) from a wall with your left / right side facing the wall. Bend your left / right elbow and gently press your elbow against the wall. Increase the pressure slowly until you are pressing as hard as you can without shrugging your shoulder. Hold for 3 seconds. Slowly release the tension and relax your muscles completely. Repeat for a total of 10 repetitions. Repeat 2 times. Complete this exercise 3 times per week.  Exercise G: Shoulder flexion, isometric     Stand or sit about 4-6 inches (10-15 cm) away from a wall with your left / right side facing the wall. Keep your left / right elbow straight and gently press the top of your fist against the wall. Increase the pressure slowly until you are pressing as hard as you can without shrugging your shoulder. Hold for 10-15 seconds. Slowly release the tension and relax your muscles completely. Repeat for a total of  10 repetitions. Repeat 2 times. Complete this exercise 3 times per week.  Exercise H: Internal rotation     Sit in a stable chair without armrests, or stand. Secure an exercise band at your left / right side, at elbow height. Place a soft object, such as a folded towel or a small pillow, under your left / right upper arm so your elbow is a few inches (about 8 cm) away from your side. Hold the end of the exercise band so the band stretches. Keeping your elbow pressed against the soft object under your arm, move your forearm across your body toward your abdomen. Keep your body steady so the movement is only coming from your shoulder. Hold for 3 seconds. Slowly return to the starting position. Repeat for a total of 10 repetitions. Repeat 2 times. Complete this exercise 3 times per  week.  Exercise I: External rotation     Sit in a stable chair without armrests, or stand. Secure an exercise band at your left / right side, at elbow height. Place a soft object, such as a folded towel or a small pillow, under your left / right upper arm so your elbow is a few inches (about 8 cm) away from your side. Hold the end of the exercise band so the band stretches. Keeping your elbow pressed against the soft object under your arm, move your forearm out, away from your abdomen. Keep your body steady so the movement is only coming from your shoulder. Hold for 3 seconds. Slowly return to the starting position. Repeat for a total of 10 repetitions. Repeat 2 times. Complete this exercise 3 times per week. Exercise J: Shoulder extension  Sit in a stable chair without armrests, or stand. Secure an exercise band to a stable object in front of you so the band is at shoulder height. Hold one end of the exercise band in each hand. Your palms should face each other. Straighten your elbows and lift your hands up to shoulder height. Step back, away from the secured end of the exercise band, until the band  stretches. Squeeze your shoulder blades together and pull your hands down to the sides of your thighs. Stop when your hands are straight down by your sides. Do not let your hands go behind your body. Hold for 3 seconds. Slowly return to the starting position. Repeat for a total of 10 repetitions. Repeat 2 times. Complete this exercise 3 times per week.  Exercise K: Shoulder extension, prone     Lie on your abdomen on a firm surface so your left / right arm hangs over the edge. Hold a 5 lb weight in your hand so your palm faces in toward your body. Your arm should be straight. Squeeze your shoulder blade down toward the middle of your back. Slowly raise your arm behind you, up to the height of the surface that you are lying on. Keep your arm straight. Hold for 3 seconds. Slowly return to the starting position and relax your muscles. Repeat for a total of 10 repetitions. Repeat 2 times. Complete this exercise 3 times per week.   Exercise L: Horizontal abduction, prone  Lie on your abdomen on a firm surface so your left / right arm hangs over the edge. Hold a 5 lb weight in your hand so your palm faces toward your feet. Your arm should be straight. Squeeze your shoulder blade down toward the middle of your back. Bend your elbow so your hand moves up, until your elbow is bent to an "L" shape (90 degrees). With your elbow bent, slowly move your forearm forward and up. Raise your hand up to the height of the surface that you are lying on. Your upper arm should not move, and your elbow should stay bent. At the top of the movement, your palm should face the floor. Hold for 3 seconds. Slowly return to the starting position and relax your muscles. Repeat for a total of 10 repetitions. Repeat 2 times. Complete this exercise 3 times per week.  Exercise M: Horizontal abduction, standing  Sit on a stable chair, or stand. Secure an exercise band to a stable object in front of you so the band is at  shoulder height. Hold one end of the exercise band in each hand. Straighten your elbows and lift your hands straight in front of you,  up to shoulder height. Your palms should face down, toward the floor. Step back, away from the secured end of the exercise band, until the band stretches. Move your arms out to your sides, and keep your arms straight. Hold for 3 seconds. Slowly return to the starting position. Repeat for a total of 10 repetitions. Repeat 2 times. Complete this exercise 3 times per week.  Exercise N: Scapular retraction and elevation  Sit on a stable chair, or stand. Secure an exercise band to a stable object in front of you so the band is at shoulder height. Hold one end of the exercise band in each hand. Your palms should face each other. Sit in a stable chair without armrests, or stand. Step back, away from the secured end of the exercise band, until the band stretches. Squeeze your shoulder blades together and lift your hands over your head. Keep your elbows straight. Hold for 3 seconds. Slowly return to the starting position. Repeat for a total of 10 repetitions. Repeat 2 times. Complete this exercise 3 times per week.  This information is not intended to replace advice given to you by your health care provider. Make sure you discuss any questions you have with your health care provider. Document Released: 12/11/2005 Document Revised: 08/17/2016 Document Reviewed: 10/28/2015 Elsevier Interactive Patient Education  2017 [employer].

## 2022-12-13 NOTE — Progress Notes (Signed)
Musculoskeletal Exam  Patient: Ian [name] DOB: September 04, 1961  DOS: 12/13/2022  SUBJECTIVE:  Chief Complaint:   Chief Complaint  Patient presents with   Shoulder Pain    Left     Ian [name] is a 61 y.o.  male for evaluation and treatment of L shoulder pain.   Onset:  4 weeks ago. No inj or change in activity.  Location: anterior shoulder Character:  aching  Progression of issue:  has worsened Associated symptoms: no bruising, redness, swelling, decreased ROM Treatment: to date has been muscle relaxers and hydrocodone.   Neurovascular symptoms: no  Past Medical History:  Diagnosis Date   Allergy    Arthritis    Biceps tendon rupture    bilateral   Clostridium difficile infection 2014   Colon polyps    Diabetes ([place])    Borderline/Pre-diabetes   GERD (gastroesophageal reflux disease)    History of DVT (deep vein thrombosis)    History of pulmonary embolism    Hyperlipidemia    Hypertension    Internal bleeding hemorrhoids 2012   "might bleed once/month; only when I strain"   Internal prolapsed hemorrhoids    Leg cramps    Neuromuscular disorder ([facility])    leg cramps    Testicular hypofunction    Wart of face    Right cheek    Objective: VITAL SIGNS: BP 136/78 (BP Location: Left Arm, Cuff Size: Normal)   Pulse 69   Temp 98.1 F (36.7 C) (Oral)   Ht '5\' 8"'$  (1.727 m)   Wt 248 lb 8 oz (112.7 kg)   SpO2 97%   BMI 37.78 kg/m  Constitutional: Well formed, well developed. No acute distress. Thorax & Lungs: No accessory muscle use Musculoskeletal: L shoulder.   Normal active range of motion: yes.   Normal passive range of motion: yes Tenderness to palpation: mild ttp over the L trap Deformity: no Ecchymosis: no Tests positive: none Tests negative: Spurling's Neurologic: Normal sensory function. No focal deficits noted. DTR's equal and symmetric in UE's. No clonus. Psychiatric: Normal mood. Age appropriate judgment and insight. Alert & oriented x 3.     Assessment:  Strain of left trapezius muscle, initial encounter  Plan: Stretches/exercises, heat, ice, Tylenol. If no better, PT.  F/u prn. The patient voiced understanding and agreement to the plan.   [place], DO 12/13/22  2:58 PM

## 2022-12-14 ENCOUNTER — Encounter: Payer: Self-pay | Admitting: Family Medicine

## 2023-01-09 ENCOUNTER — Other Ambulatory Visit: Payer: Self-pay | Admitting: Family Medicine

## 2023-02-05 ENCOUNTER — Ambulatory Visit (INDEPENDENT_AMBULATORY_CARE_PROVIDER_SITE_OTHER): Payer: Self-pay | Admitting: Family Medicine

## 2023-02-05 ENCOUNTER — Encounter: Payer: Self-pay | Admitting: Family Medicine

## 2023-02-05 VITALS — BP 110/80 | HR 87 | Temp 98.8°F | Ht 68.0 in | Wt 247.2 lb

## 2023-02-05 DIAGNOSIS — E1169 Type 2 diabetes mellitus with other specified complication: Secondary | ICD-10-CM

## 2023-02-05 DIAGNOSIS — I499 Cardiac arrhythmia, unspecified: Secondary | ICD-10-CM

## 2023-02-05 DIAGNOSIS — I10 Essential (primary) hypertension: Secondary | ICD-10-CM

## 2023-02-05 DIAGNOSIS — E669 Obesity, unspecified: Secondary | ICD-10-CM

## 2023-02-05 LAB — LIPID PANEL
Cholesterol: 167 mg/dL (ref 0–200)
HDL: 34.8 mg/dL — ABNORMAL LOW (ref >39.00)
LDL Cholesterol: 112 mg/dL — ABNORMAL HIGH (ref 0–99)
NonHDL: 132.16
Total CHOL/HDL Ratio: 5
Triglycerides: 99 mg/dL (ref 0.0–149.0)
VLDL: 19.8 mg/dL (ref 0.0–40.0)

## 2023-02-05 LAB — COMPREHENSIVE METABOLIC PANEL
ALT: 36 U/L (ref 0–53)
AST: 29 U/L (ref 0–37)
Albumin: 4.2 g/dL (ref 3.5–5.2)
Alkaline Phosphatase: 51 U/L (ref 39–117)
BUN: 13 mg/dL (ref 6–23)
CO2: 24 mEq/L (ref 19–32)
Calcium: 9.5 mg/dL (ref 8.4–10.5)
Chloride: 107 mEq/L (ref 96–112)
Creatinine, Ser: 1 mg/dL (ref 0.40–1.50)
GFR: 80.16 mL/min (ref >60.00)
Glucose, Bld: 85 mg/dL (ref 70–99)
Potassium: 4 mEq/L (ref 3.5–5.1)
Sodium: 140 mEq/L (ref 135–145)
Total Bilirubin: 0.8 mg/dL (ref 0.2–1.2)
Total Protein: 6.5 g/dL (ref 6.0–8.3)

## 2023-02-05 LAB — EKG 12-LEAD

## 2023-02-05 LAB — HEMOGLOBIN A1C: Hgb A1c MFr Bld: 6.6 % — ABNORMAL HIGH (ref 4.6–6.5)

## 2023-02-05 NOTE — Progress Notes (Signed)
Subjective:   Chief Complaint  Patient presents with   Follow-up    6 month     Ian [name] is a 62 y.o. male here for follow-up of diabetes.   Ian [name]'s self monitored glucose range is low 100's, 90's.  Patient denies hypoglycemic reactions. He checks his glucose levels 1 time(s) per day. Patient does not require insulin.   Medications include: Trulicity 1.5 mg/week,  Diet is fair.  Exercise: walking  Hypertension Patient presents for hypertension follow up. He does not monitor home blood pressures. He is compliant with medication- losartan 50 mg/d. Patient has these side effects of medication: none Diet/exercise as above. No CP or SOB.   Past Medical History:  Diagnosis Date   Allergy    Arthritis    Biceps tendon rupture    bilateral   Clostridium difficile infection 2014   Colon polyps    Diabetes ([place])    Borderline/Pre-diabetes   GERD (gastroesophageal reflux disease)    History of DVT (deep vein thrombosis)    History of pulmonary embolism    Hyperlipidemia    Hypertension    Internal bleeding hemorrhoids 2012   "might bleed once/month; only when I strain"   Internal prolapsed hemorrhoids    Leg cramps    Neuromuscular disorder ([facility])    leg cramps    Testicular hypofunction    Wart of face    Right cheek     Related testing: Retinal exam: due, has not had insurance Pneumovax: done  Objective:  BP 110/80 (BP Location: Left Arm, Patient Position: Sitting, Cuff Size: Large)   Pulse 87   Temp 98.8 F (37.1 C) (Oral)   Ht 5' 8"$  (1.727 m)   Wt 247 lb 4 oz (112.2 kg)   SpO2 93%   BMI 37.59 kg/m  General:  Well developed, well nourished, in no apparent distress Skin:  Warm, no pallor or diaphoresis on exposed skin Lungs:  CTAB, no access msc use Cardio:  Reg rate, irreg irreg rhythm, no bruits, no LE edema Psych: Age appropriate judgment and insight  Assessment:   Diabetes mellitus type 2 in obese ([place]) - Plan: Comprehensive metabolic panel,  Lipid panel, Hemoglobin A1c, Microalbumin / creatinine urine ratio  Essential hypertension  Irregular heartbeat - Plan: EKG 12-Lead   Plan:   Chronic, stable. Cont Trulicity 1.5 mg/week. Counseled on diet and exercise. He does not have ins. Rec'd an eye exam but this is not financially feasible right now.  Chronic, stable. Cont losartan 50 mg/d. EKG shows irreg rhythm, reg rate, p waves before every QRS complex, narrow, no signs of ischemia. Reassurance.   F/u in 6 mo. The patient voiced understanding and agreement to the plan.  [place], DO 02/05/23 11:17 AM

## 2023-02-05 NOTE — Patient Instructions (Signed)
Give us 2-3 business days to get the results of your labs back.   Keep the diet clean and stay active.  Let us know if you need anything. 

## 2023-02-06 LAB — MICROALBUMIN / CREATININE URINE RATIO
Creatinine,U: 226.7 mg/dL
Microalb Creat Ratio: 0.4 mg/g (ref 0.0–30.0)
Microalb, Ur: 1 mg/dL (ref 0.0–1.9)

## 2023-02-19 ENCOUNTER — Other Ambulatory Visit: Payer: Self-pay | Admitting: Family Medicine

## 2023-03-05 ENCOUNTER — Telehealth: Payer: Self-pay | Admitting: Family Medicine

## 2023-03-05 NOTE — Telephone Encounter (Signed)
Pt requested callback at [phone].

## 2023-03-05 NOTE — Telephone Encounter (Signed)
Called the patient back to confirm his Trulicity dose and that he has plenty from patient assistance. [facility] has sent a fax saying the 1.5 mg is temporarily unavailabe. But patient currently has 9 weeks left of his last supply. PCP said he should be ok for now until his next order.

## 2023-04-05 ENCOUNTER — Encounter: Payer: Self-pay | Admitting: Family Medicine

## 2023-04-06 ENCOUNTER — Other Ambulatory Visit: Payer: Self-pay | Admitting: Family Medicine

## 2023-04-06 MED ORDER — TRULICITY 3 MG/0.5ML ~~LOC~~ SOAJ: 3.0000 mg | SUBCUTANEOUS | 3 refills | Status: DC

## 2023-04-10 ENCOUNTER — Encounter: Payer: Self-pay | Admitting: Family Medicine

## 2023-04-10 ENCOUNTER — Ambulatory Visit (INDEPENDENT_AMBULATORY_CARE_PROVIDER_SITE_OTHER): Payer: Self-pay | Admitting: Family Medicine

## 2023-04-10 VITALS — BP 122/82 | HR 60 | Temp 98.2°F | Ht 68.0 in | Wt 246.2 lb

## 2023-04-10 DIAGNOSIS — M79672 Pain in left foot: Secondary | ICD-10-CM

## 2023-04-10 NOTE — Patient Instructions (Addendum)
Ice/cold pack over area for 10-15 min twice daily.  OK to take Tylenol 1000 mg (2 extra strength tabs) or 975 mg (3 regular strength tabs) every 6 hours as needed.  Ok to continue hydrocodone as needed.  Send a message if this does not improve in the next few weeks.   Let us know if you need anything.

## 2023-04-10 NOTE — Progress Notes (Signed)
Musculoskeletal Exam  Patient: Ian [name] DOB: 11/18/1961  DOS: 04/10/2023  SUBJECTIVE:  Chief Complaint:   Chief Complaint  Patient presents with   Foot Injury    Ian [name] is a 62 y.o.  male for evaluation and treatment of L ft pain.   Onset:  3 days ago. Stepped down from mower and felt a pain.  Location: L outer foot at base of MT Character:  sharp  Progression of issue:  has slightly improved Associated symptoms:  No bruising, redness, swelling.  Treatment: to date has been rest and hydrocodone.   Neurovascular symptoms: no  Past Medical History:  Diagnosis Date   Allergy    Arthritis    Biceps tendon rupture    bilateral   Clostridium difficile infection 2014   Colon polyps    Diabetes    Borderline/Pre-diabetes   GERD (gastroesophageal reflux disease)    History of DVT (deep vein thrombosis)    History of pulmonary embolism    Hyperlipidemia    Hypertension    Internal bleeding hemorrhoids 2012   "might bleed once/month; only when I strain"   Internal prolapsed hemorrhoids    Leg cramps    Neuromuscular disorder    leg cramps    Testicular hypofunction    Wart of face    Right cheek    Objective: VITAL SIGNS: BP 122/82 (BP Location: Left Arm, Patient Position: Sitting, Cuff Size: Large)   Pulse 60   Temp 98.2 F (36.8 C) (Oral)   Ht  (1.727 m)   Wt 246 lb 4 oz (111.7 kg)   SpO2 96%   BMI 37.44 kg/m  Constitutional: Well formed, well developed. No acute distress. Thorax & Lungs: No accessory muscle use Musculoskeletal: L foot.   Tenderness to palpation: TTP over base of 5th metatarsal Deformity: no Ecchymosis: no Edema: no No TTP over musculature or other bony landmarks.  Neurologic: Normal sensory function. Psychiatric: Normal mood. Age appropriate judgment and insight. Alert & oriented x 3.    Assessment:  Left foot pain  Plan: Hydrocodone prn, ice, Tylenol. Offered flat sole shoe which he politely declined. Wlil refer  to sports med if no improvement with conservative management as this is likely a contusion.  F/u prn. The patient voiced understanding and agreement to the plan.   [name], DO 04/10/23  9:03 AM

## 2023-05-17 ENCOUNTER — Other Ambulatory Visit: Payer: Self-pay | Admitting: Family Medicine

## 2023-06-11 ENCOUNTER — Ambulatory Visit (INDEPENDENT_AMBULATORY_CARE_PROVIDER_SITE_OTHER): Payer: Self-pay | Admitting: Family Medicine

## 2023-06-11 ENCOUNTER — Encounter: Payer: Self-pay | Admitting: Family Medicine

## 2023-06-11 VITALS — BP 120/72 | HR 81 | Temp 99.3°F | Ht 68.0 in | Wt 243.1 lb

## 2023-06-11 DIAGNOSIS — J40 Bronchitis, not specified as acute or chronic: Secondary | ICD-10-CM

## 2023-06-11 MED ORDER — OMEPRAZOLE 40 MG PO CPDR: 40.0000 mg | DELAYED_RELEASE_CAPSULE | Freq: Two times a day (BID) | ORAL | 2 refills | Status: DC

## 2023-06-11 MED ORDER — OMEPRAZOLE 40 MG PO CPDR: 40.0000 mg | DELAYED_RELEASE_CAPSULE | Freq: Every day | ORAL | 2 refills | Status: DC

## 2023-06-11 MED ORDER — PROMETHAZINE-DM 6.25-15 MG/5ML PO SYRP: 5.0000 mL | ORAL_SOLUTION | Freq: Every evening | ORAL | 0 refills | Status: DC | PRN

## 2023-06-11 MED ORDER — METHYLPREDNISOLONE ACETATE 80 MG/ML IJ SUSP
80.0000 mg | Freq: Once | INTRAMUSCULAR | Status: AC
  Administered 2023-06-11: 80 mg via INTRAMUSCULAR

## 2023-06-11 NOTE — Progress Notes (Signed)
Chief Complaint  Patient presents with   Sinusitis    Ian [name] here for URI complaints.  Duration: 4 days; getting worse over past couple days Associated symptoms: sinus congestion, rhinorrhea, sore throat, productive cough (clear today, was yellow), wheezing, shortness of breath, and myalgia Denies: sinus pain, itchy watery eyes, ear pain, ear drainage, and fevers Treatment to date: Dayquil, Nyquil Sick contacts: No  Past Medical History:  Diagnosis Date   Allergy    Arthritis    Biceps tendon rupture    bilateral   Clostridium difficile infection 2014   Colon polyps    Diabetes ([facility])    Borderline/Pre-diabetes   GERD (gastroesophageal reflux disease)    History of DVT (deep vein thrombosis)    History of pulmonary embolism    Hyperlipidemia    Hypertension    Internal bleeding hemorrhoids 2012   "might bleed once/month; only when I strain"   Internal prolapsed hemorrhoids    Leg cramps    Neuromuscular disorder ([facility])    leg cramps    Testicular hypofunction    Wart of face    Right cheek    Objective BP 120/72 (BP Location: Left Arm, Patient Position: Sitting, Cuff Size: Normal)   Pulse 81   Temp 99.3 F (37.4 C) (Oral)   Ht 5\' 8"  (1.727 m)   Wt 243 lb 2 oz (110.3 kg)   SpO2 97%   BMI 36.97 kg/m  General: Awake, alert, appears stated age HEENT: AT, [place], ears patent b/l and TM's neg, nares patent w/o discharge, pharynx pink and without exudates, MMM, no sinus ttp Neck: No masses or asymmetry Heart: RRR Lungs: diffuse exp wheezes, no accessory muscle use Psych: Age appropriate judgment and insight, normal mood and affect  Wheezy bronchitis - Plan: promethazine-dextromethorphan (PROMETHAZINE-DM) 6.25-15 MG/5ML syrup  IM Depo 80 mg today. Continue to push fluids, practice good hand hygiene, cover mouth when coughing. F/u prn. If starting to experience fevers, shaking, or shortness of breath, seek immediate care. Pt voiced understanding and agreement to  the plan.  Jilda Roche [place], DO 06/11/23 9:54 AM

## 2023-06-11 NOTE — Patient Instructions (Signed)
Continue to push fluids, practice good hand hygiene, and cover your mouth if you cough. ? ?If you start having fevers, shaking or shortness of breath, seek immediate care. ? ?OK to take Tylenol 1000 mg (2 extra strength tabs) or 975 mg (3 regular strength tabs) every 6 hours as needed. ? ?Let us know if you need anything. ?

## 2023-06-11 NOTE — Addendum Note (Signed)
Addended by: Scharlene Gloss B on: 06/11/2023 10:09 AM   Modules accepted: Orders

## 2023-06-18 ENCOUNTER — Encounter: Payer: Self-pay | Admitting: Family Medicine

## 2023-06-18 ENCOUNTER — Other Ambulatory Visit: Payer: Self-pay | Admitting: Family Medicine

## 2023-06-18 MED ORDER — AZITHROMYCIN 250 MG PO TABS
ORAL_TABLET | ORAL | 0 refills | Status: DC
Start: 2023-06-18 — End: 2023-08-20

## 2023-06-21 ENCOUNTER — Telehealth: Payer: Self-pay | Admitting: Family Medicine

## 2023-06-21 NOTE — Telephone Encounter (Signed)
Pt dropped off document (Patient Assistance Program Application- small white envelope) for provider to have document faxed. Pt stated CMA (Robin) is aware to were to fax document. Document put at front office tray under providers name.

## 2023-06-22 NOTE — Telephone Encounter (Signed)
Form complete Faxed to [phone] to PAP  Patient WG#[id number] Patient informed faxed.

## 2023-06-25 ENCOUNTER — Encounter: Payer: Self-pay | Admitting: Family Medicine

## 2023-06-25 ENCOUNTER — Telehealth: Payer: Self-pay | Admitting: Family Medicine

## 2023-06-25 NOTE — Telephone Encounter (Signed)
Received letter from [facility] [employer] Patient assistance has been approved for one year. Patient is aware of approval.

## 2023-06-25 NOTE — Telephone Encounter (Signed)
Pt dropped off Trulicity paperwork. Placed in PCP box.

## 2023-06-25 NOTE — Telephone Encounter (Signed)
Received the paperwork but called the patient think may not be the correct company for Trulicity. He will check with his wife tonight/complete new paperwork if necessary and get to [facility]

## 2023-07-02 ENCOUNTER — Encounter: Payer: Self-pay | Admitting: Family Medicine

## 2023-07-02 ENCOUNTER — Ambulatory Visit (INDEPENDENT_AMBULATORY_CARE_PROVIDER_SITE_OTHER): Payer: Self-pay | Admitting: Family Medicine

## 2023-07-02 VITALS — BP 122/80 | HR 90 | Temp 98.2°F | Ht 68.0 in | Wt 240.5 lb

## 2023-07-02 DIAGNOSIS — K625 Hemorrhage of anus and rectum: Secondary | ICD-10-CM

## 2023-07-02 NOTE — Patient Instructions (Addendum)
Please contact the GI team at: [phone] and ask to schedule with Dr. Lavon Paganini since Dr. Orvan Falconer left.  Stay hydrated.   If you do not hear anything about your referral in the next 1-2 weeks, call our office and ask for an update.  Let us know if you need anything.

## 2023-07-02 NOTE — Progress Notes (Signed)
Chief Complaint  Patient presents with   Blood In Stools    hemorrhoids    Ian [name] is a 62 y.o. male here for a skin complaint.  Duration: 3 weeks Location: rear end Pruritic? No Painful? Yes Drainage? No New soaps/lotions/topicals/detergents? No trauma? No Other associated symptoms: darker blood in stool; unsure if maroon-colored but definitely not bright red; he does have a history of fissures and internal hemorrhoids. Last colonoscopy was 2022.  5-year follow-up due to polyps recommended. Therapies tried thus far: none  Past Medical History:  Diagnosis Date   Allergy    Arthritis    Biceps tendon rupture    bilateral   Clostridium difficile infection 2014   Colon polyps    Diabetes (HCC)    Borderline/Pre-diabetes   GERD (gastroesophageal reflux disease)    History of DVT (deep vein thrombosis)    History of pulmonary embolism    Hyperlipidemia    Hypertension    Internal bleeding hemorrhoids 2012   "might bleed once/month; only when I strain"   Internal prolapsed hemorrhoids    Leg cramps    Neuromuscular disorder (HCC)    leg cramps    Testicular hypofunction    Wart of face    Right cheek    BP 122/80 (BP Location: Left Arm, Patient Position: Sitting, Cuff Size: Large)   Pulse 90   Temp 98.2 F (36.8 C) (Oral)   Ht 5\' 8"  (1.727 m)   Wt 240 lb 8 oz (109.1 kg)   SpO2 98%   BMI 36.57 kg/m  Gen: awake, alert, appearing stated age Lungs: No accessory muscle use Skin: Rectal area without any fissures, bleeding, or hemorrhoids. No drainage, erythema, TTP, fluctuance, excoriation Psych: Age appropriate judgment and insight  Rectal bleeding - Plan: CBC  Check CBC.  He is established with a GI doc, recommended he reach out to them for further evaluation.  If it was bright red blood, would support more of hemorrhoids but the dark blood makes me want him to see the specialist. F/u prn. The patient voiced understanding and agreement to the  plan.  Jilda Roche [place], DO 07/02/23 3:17 PM

## 2023-07-02 NOTE — Telephone Encounter (Signed)
Received correct paperwork from the Patient for [facility] Patient Assistant program from the patient, Patient completed his part of form/PCP completed our part. Have faxed form to [facility] Form faxed to [phone] Received confirmation  Will be sent to scan

## 2023-07-03 LAB — CBC
HCT: 45.7 % (ref 39.0–52.0)
Hemoglobin: 15.4 g/dL (ref 13.0–17.0)
MCHC: 33.7 g/dL (ref 30.0–36.0)
MCV: 93.9 fl (ref 78.0–100.0)
Platelets: 157 10*3/uL (ref 150.0–400.0)
RBC: 4.87 Mil/uL (ref 4.22–5.81)
RDW: 14.1 % (ref 11.5–15.5)
WBC: 7.6 10*3/uL (ref 4.0–10.5)

## 2023-07-03 MED ORDER — TRULICITY 1.5 MG/0.5ML ~~LOC~~ SOAJ: 1.5000 mg | SUBCUTANEOUS | 3 refills | Status: DC

## 2023-07-03 NOTE — Addendum Note (Signed)
Addended by: Scharlene Gloss B on: 07/03/2023 02:37 PM   Modules accepted: Orders

## 2023-07-03 NOTE — Telephone Encounter (Signed)
Received paperwork for additional information on form Sent in along with paperwork hardcopy for prescription Patient had already signed on line, but came into the office to sign paperwork. Will refax corrections.

## 2023-07-05 ENCOUNTER — Other Ambulatory Visit: Payer: Self-pay | Admitting: Family Medicine

## 2023-07-13 ENCOUNTER — Encounter: Payer: Self-pay | Admitting: Internal Medicine

## 2023-07-13 ENCOUNTER — Ambulatory Visit (INDEPENDENT_AMBULATORY_CARE_PROVIDER_SITE_OTHER): Payer: Self-pay | Admitting: Internal Medicine

## 2023-07-13 VITALS — BP 124/80 | HR 78 | Ht 68.0 in | Wt 244.0 lb

## 2023-07-13 DIAGNOSIS — K625 Hemorrhage of anus and rectum: Secondary | ICD-10-CM

## 2023-07-13 DIAGNOSIS — K649 Unspecified hemorrhoids: Secondary | ICD-10-CM

## 2023-07-13 MED ORDER — HYDROCORTISONE (PERIANAL) 2.5 % EX CREA: 1.0000 | TOPICAL_CREAM | Freq: Two times a day (BID) | CUTANEOUS | 0 refills | Status: DC

## 2023-07-13 NOTE — Patient Instructions (Signed)
We have sent the following medications to your pharmacy for you to pick up at your convenience: Anusol 2 times daily for 7 days   _______________________________________________________  If your blood pressure at your visit was 140/90 or greater, please contact your primary care physician to follow up on this.  _______________________________________________________  If you are age 64 or older, your body mass index should be between 23-30. Your Body mass index is 37.1 kg/m. If this is out of the aforementioned range listed, please consider follow up with your Primary Care Provider.  If you are age 36 or younger, your body mass index should be between 19-25. Your Body mass index is 37.1 kg/m. If this is out of the aformentioned range listed, please consider follow up with your Primary Care Provider.   ________________________________________________________  The [facility] [facility] providers would like to encourage you to use [facility] to communicate with providers for non-urgent requests or questions.  Due to long hold times on the telephone, sending your provider a message by [facility] may be a faster and more efficient way to get a response.  Please allow 48 business hours for a response.  Please remember that this is for non-urgent requests.  _______________________________________________________   Thank you for entrusting me with your care and for choosing [facility], Dr. Eulah Pont

## 2023-07-13 NOTE — Progress Notes (Signed)
Reason for Consultation:  Rectal bleeding  IMPRESSION:  Rectal bleeding Internal hemorrhoids History of colon polyps    - tubular adenoma and hyperplastic polyp on colonoscopy 2012    - no polyps on colonoscopy 2015    - surveillance recommended in 5 years    - hyperplastic polyps on colonoscopy 2022 Hemorrhoidectomy, fissurectomy and internal sphincterotomy 06/24/13 Chronic Xarelto use for history of DVT  Patient has had rectal bleeding for the last 2 months, which are due to hemorrhoids. On rectal exam today, his hemorrhoids were found to be bleeding so will start him on topical therapy. I also went over hemorrhoidal banding with the patient  PLAN: - Avoid straining, sitting on toilet for long periods of time, and constipation - Anusol HC cream BID for 7 days - Will give hemorrhoidal banding pamphlet - If cream is not effective, could consider Anusol HS suppository or hemorrhoidal banding in the future  HPI: Ian [name] is a 62 y.o. male presents for follow up of rectal bleeding  Internal History: Patient presents with recurrent rectal bleeding. He has had an anal fissure in the past that was treated with surgery. Grandmother had colon cancer. Great grandfather had stomach cancer. Last colonoscopy was in 2022 with hemorrhoids and small polyps. About 2 months ago he started noticing rectal bleeding. His stools have looked red. Endorses some bright red blood. His PCP wanted him to see a GI specialist. Yesterday he saw bright red blood again. He is on Xarelto therapy for history of DVT. This morning he had some burning on the outside of his anus. He used to have constipation but now on average he has 2 BMs per day. He has to strain on occasion. He uses a stool softener so his stools are soft. He has not been treated for hemorhroids in the past.  Labs 01/2023: CMP unremarkable.  Labs 06/2023: CBC nml  Review of outside records shows: - Colonoscopy 2012: tubular adenoma and hyperplastic  polyps - Colonoscopy with Dr. Myra Gianotti 08/17/14 revealed internal hemorrhoids but was otherwise normal. Repeat colonoscopy recommended in 5 years.  - Preprocedure office visit notes a history of hemorrhoids, anal fissure, and colon cancer in a grandparent.  - Hemorrhoidectomy, fissurectomy and internal sphincterotomy 06/24/13 with Dr. Charlena Cross.   Colonoscopy 03/22/21: 3 small hyperplastic polyps. Internal hemorrhoids   Past Medical History:  Diagnosis Date   Allergy    Arthritis    Biceps tendon rupture    bilateral   Clostridium difficile infection 2014   Colon polyps    Diabetes (HCC)    Borderline/Pre-diabetes   GERD (gastroesophageal reflux disease)    History of DVT (deep vein thrombosis)    History of pulmonary embolism    Hyperlipidemia    Hypertension    Internal bleeding hemorrhoids 2012   "might bleed once/month; only when I strain"   Internal prolapsed hemorrhoids    Leg cramps    Neuromuscular disorder (HCC)    leg cramps    Testicular hypofunction    Wart of face    Right cheek    Past Surgical History:  Procedure Laterality Date   ANAL FISSURE REPAIR  06/24/2013   APPENDECTOMY  2011   BACK SURGERY     COLONOSCOPY W/ BIOPSIES AND POLYPECTOMY  2012   COLONSCOPY  2015   EXCISIONAL HEMORRHOIDECTOMY     FISSURECTOMY     HEMORRHOID SURGERY  06/24/2013   internal sphincterotomy  06-24-2013   LUMBAR LAMINECTOMY/DECOMPRESSION MICRODISCECTOMY N/A 01/20/2015  Procedure: MICRO LUMBAR DECOMPRESSION L4-5/L3-4/L2-3;  Surgeon: Javier Docker, MD;  Location: [facility];  Service: Orthopedics;  Laterality: N/A;   QUADRICEPS TENDON REPAIR Left 05/13/2020   Procedure: REPAIR QUADRICEP TENDON;  Surgeon: Jene Every, MD;  Location: [facility];  Service: Orthopedics;  Laterality: Left;   SHOULDER ARTHROSCOPY  08/22/2012   Procedure: ARTHROSCOPY SHOULDER;  Surgeon: Senaida Lange, MD;  Location: [facility];  Service: Orthopedics;  Laterality: Left;  Left shoulder arthroscopic lavage and  debridement   SHOULDER ARTHROSCOPY  09/02/2012   Procedure: ARTHROSCOPY SHOULDER;  Surgeon: Senaida Lange, MD;  Location: [facility];  Service: Orthopedics;  Laterality: Left;  Left shoulder arthroscopy irrigation and debridement   SHOULDER ARTHROSCOPY W/ ROTATOR CUFF REPAIR  07/09/2012   left   SHOULDER OPEN ROTATOR CUFF REPAIR Right 12/13/2017   Procedure: Right shoulder mini open rotator cuff repair;  Surgeon: Jene Every, MD;  Location: [facility];  Service: Orthopedics;  Laterality: Right;  Interscalene Block   SKIN LESION EXCISION     Benign   WISDOM TOOTH EXTRACTION  YRS AGO    Current Outpatient Medications  Medication Sig Dispense Refill   azithromycin (ZITHROMAX) 250 MG tablet Take 2 tabs the first day and then 1 tab daily until you run out. 6 tablet 0   Blood Glucose Monitoring Suppl (RELION CONFIRM GLUCOSE MONITOR) W/DEVICE KIT USE AS DIRECTED TO TEST BLOOD GLUCOSE DAILY DX: 250.00 1 kit 0   Cholecalciferol (VITAMIN D-3) 5000 UNITS TABS Take 5,000 Units by mouth daily.      cyclobenzaprine (FLEXERIL) 10 MG tablet TAKE ONE TABLET BY MOUTH THREE TIMES A DAY AS NEEDED MUSCLE SPASMS 90 tablet 1   Dulaglutide (TRULICITY) 1.5 MG/0.5ML SOPN Inject 1.5 mg into the skin once a week. 6 mL 3   EPIPEN 2-PAK 0.3 MG/0.3ML SOAJ injection Inject 0.3 mg into the skin once.     ezetimibe (ZETIA) 10 MG tablet TAKE 1 TABLET BY MOUTH DAILY 90 tablet 2   fenofibrate (TRICOR) 145 MG tablet TAKE ONE TABLET BY MOUTH DAILY 90 tablet 5   fexofenadine (ALLEGRA) 180 MG tablet Take 180 mg by mouth daily.     HYDROcodone-acetaminophen (NORCO) 5-325 MG tablet Take 1 tablet by mouth every 6 (six) hours as needed for moderate pain. 30 tablet 0   losartan (COZAAR) 50 MG tablet TAKE 1 TABLET BY MOUTH DAILY 90 tablet 0   Magnesium 400 MG TABS Take 800 mg by mouth daily.      mometasone (NASONEX) 50 MCG/ACT nasal spray Place 2 sprays into the nose daily. 1 each 12   omeprazole (PRILOSEC) 40 MG capsule Take 1 capsule (40 mg  total) by mouth daily. 90 capsule 2   potassium gluconate 595 (99 K) MG TABS tablet Take 595 mg by mouth daily.      XARELTO 20 MG TABS tablet TAKE ONE TABLET BY MOUTH DAILY WITH SUPPER 90 tablet 1   No current facility-administered medications for this visit.    Allergies as of 07/13/2023 - Review Complete 07/13/2023  Allergen Reaction Noted   Penicillins Rash, Other (See Comments), Diarrhea, and Hives 08/21/2012   Tetanus toxoid, adsorbed Anaphylaxis 05/13/2013   Tetanus toxoids Anaphylaxis 08/21/2012   Aspirin Nausea Only and Rash 08/21/2012   Daptomycin Other (See Comments) 09/05/2012   Prednisone Other (See Comments) and Rash 05/13/2013   Bioflavonoids Diarrhea 11/15/2015   Lisinopril Cough 08/30/2021   Other  12/10/2017   Tetanus immune globulin  04/29/2018   Citrus Diarrhea and Other (  See Comments) 01/22/2015   Fenofibrate Other (See Comments) 05/20/2014   Milk-related compounds Diarrhea and Other (See Comments) 01/22/2015   Oxycodone Other (See Comments) 03/08/2015    Family History  Problem Relation Age of Onset   Heart disease Mother 94       Deceased   Hyperlipidemia Mother    Hypertension Mother    Heart disease Father 10       Deceased   CVA Father    Hypertension Maternal Grandmother    Hyperlipidemia Maternal Grandmother    Cancer Maternal Grandmother        Colon Cancer   Colon cancer Maternal Grandmother    Colon polyps Maternal Grandmother    Heart disease Maternal Grandfather    Hypertension Maternal Grandfather    Hyperlipidemia Maternal Grandfather    Cancer Maternal Grandfather        Lung Cancer   Heart attack Maternal Grandfather    Heart disease Paternal Grandfather    Stomach cancer Maternal Great-grandfather    Esophageal cancer Neg Hx    Rectal cancer Neg Hx     Social History   Socioeconomic History   Marital status: Married    Spouse name: Not on file   Number of children: Not on file   Years of education: Not on file   Highest  education level: Associate degree: occupational, Scientist, product/process development, or vocational program  Occupational History   Not on file  Tobacco Use   Smoking status: Never   Smokeless tobacco: Never   Tobacco comments:    NEVER USED TOBACCO  Vaping Use   Vaping status: Never Used  Substance and Sexual Activity   Alcohol use: No    Alcohol/week: 0.0 standard drinks of alcohol   Drug use: No   Sexual activity: Not Currently  Other Topics Concern   Not on file  Social History Narrative   Marital Status: Married Britta Mccreedy)    Children:  Step Daughter Marcelino Duster)    Pets: None    Living Situation: Lives with Britta Mccreedy    Occupation: [employer])    Education: Associate's Degree in [employer]    Tobacco Use/Exposure:  None    Alcohol Use:  Rarely    Drug Use:  None   Diet:  Regular   Exercise: Limited     Hobbies:  Architect, Therapist, music             Social Determinants of Health   Financial Resource Strain: Low Risk  (04/09/2023)   Overall Financial Resource Strain (CARDIA)    Difficulty of Paying Living Expenses: Not very hard  Food Insecurity: No Food Insecurity (04/09/2023)   Hunger Vital Sign    Worried About Running Out of Food in the Last Year: Never true    Ran Out of Food in the Last Year: Never true  Transportation Needs: No Transportation Needs (04/09/2023)   PRAPARE - Administrator, Civil Service (Medical): No    Lack of Transportation (Non-Medical): No  Physical Activity: Sufficiently Active (04/09/2023)   Exercise Vital Sign    Days of Exercise per Week: 6 days    Minutes of Exercise per Session: 100 min  Stress: Stress Concern Present (04/09/2023)   [employer] of Occupational Health - Occupational Stress Questionnaire    Feeling of Stress : To some extent  Social Connections: Unknown (04/09/2023)   Social Connection and Isolation Panel [NHANES]    Frequency of Communication with Friends and Family: More than three times  a week    Frequency of  Social Gatherings with Friends and Family: More than three times a week    Attends Religious Services: More than 4 times per year    Active Member of [employer] or Organizations: Not on file    Attends Banker Meetings: Not on file    Marital Status: Married  Catering manager Violence: Not on file    Physical Exam: General:   Alert,  well-nourished, pleasant and cooperative in NAD Head:  Normocephalic and atraumatic. Lungs:  Clear throughout to auscultation.   No wheezes. Heart:  Regular rate and rhythm Abdomen:  Soft, nontender, nondistended, normal bowel sounds, no rebound or guarding. No hepatosplenomegaly.   Rectal:  Internal hemorrhoids that are bleeding Extremities:  No clubbing or edema. Neurologic:  Alert and  oriented x4;  grossly nonfocal Skin:  Intact without significant lesions or rashes. Psych:  Alert and cooperative. Normal mood and affect.   Eulah Pont, MD 07/13/2023, 2:07 PM  I spent 35 minutes of time, including in depth chart review, independent review of results as outlined above, communicating results with the patient directly, face-to-face time with the patient, coordinating care, ordering studies and medications as appropriate, and documentation.

## 2023-07-16 ENCOUNTER — Other Ambulatory Visit: Payer: Self-pay

## 2023-07-16 ENCOUNTER — Encounter: Payer: Self-pay | Admitting: Internal Medicine

## 2023-07-16 MED ORDER — HYDROCORTISONE ACETATE 25 MG RE SUPP: 25.0000 mg | Freq: Two times a day (BID) | RECTAL | 0 refills | Status: AC

## 2023-07-25 ENCOUNTER — Encounter (INDEPENDENT_AMBULATORY_CARE_PROVIDER_SITE_OTHER): Payer: Self-pay

## 2023-07-26 ENCOUNTER — Telehealth: Payer: Self-pay | Admitting: Family Medicine

## 2023-07-26 ENCOUNTER — Encounter: Payer: Self-pay | Admitting: Family Medicine

## 2023-07-26 NOTE — Telephone Encounter (Signed)
Pt called stating that he was having an issue with being approved for the [employer] Patient assistance program and wanted to speak to Zella Ball concerning it. Pt stated he would be available for a call b/w 4:305 today (8.1.24)

## 2023-07-26 NOTE — Telephone Encounter (Signed)
Spoke to the patient and the [facility] form faxed on 07/03/23 was not the correct form His wife will download the correct form///will complete their sections and bring to our office for Provider to complete.

## 2023-07-26 NOTE — Telephone Encounter (Signed)
Called left message to call back 

## 2023-07-27 NOTE — Telephone Encounter (Signed)
Completed form/and faxed over.  Patients wife informed and patient

## 2023-07-27 NOTE — Telephone Encounter (Signed)
Received correct paperwork from patients wife today will give to PCP

## 2023-08-06 ENCOUNTER — Ambulatory Visit: Payer: Self-pay | Admitting: Family Medicine

## 2023-08-13 ENCOUNTER — Other Ambulatory Visit: Payer: Self-pay | Admitting: Family Medicine

## 2023-08-16 ENCOUNTER — Ambulatory Visit: Payer: Self-pay | Admitting: Family Medicine

## 2023-08-17 ENCOUNTER — Ambulatory Visit: Payer: Self-pay | Admitting: Gastroenterology

## 2023-08-20 ENCOUNTER — Encounter: Payer: Self-pay | Admitting: Family Medicine

## 2023-08-20 ENCOUNTER — Ambulatory Visit (INDEPENDENT_AMBULATORY_CARE_PROVIDER_SITE_OTHER): Payer: Self-pay | Admitting: Family Medicine

## 2023-08-20 VITALS — BP 118/80 | HR 91 | Temp 98.5°F | Ht 68.0 in | Wt 245.0 lb

## 2023-08-20 DIAGNOSIS — E669 Obesity, unspecified: Secondary | ICD-10-CM

## 2023-08-20 DIAGNOSIS — E1169 Type 2 diabetes mellitus with other specified complication: Secondary | ICD-10-CM

## 2023-08-20 DIAGNOSIS — E782 Mixed hyperlipidemia: Secondary | ICD-10-CM

## 2023-08-20 DIAGNOSIS — Z7985 Long-term (current) use of injectable non-insulin antidiabetic drugs: Secondary | ICD-10-CM

## 2023-08-20 DIAGNOSIS — I10 Essential (primary) hypertension: Secondary | ICD-10-CM

## 2023-08-20 MED ORDER — GLYBURIDE 5 MG PO TABS: 5.0000 mg | ORAL_TABLET | Freq: Two times a day (BID) | ORAL | 4 refills | Status: DC

## 2023-08-20 MED ORDER — EZETIMIBE 10 MG PO TABS: 10.0000 mg | ORAL_TABLET | Freq: Every day | ORAL | 3 refills | Status: DC

## 2023-08-20 NOTE — Progress Notes (Signed)
Subjective:   Chief Complaint  Patient presents with   Follow-up    Ian [name] is a 62 y.o. male here for follow-up of diabetes.   [name]'s self monitored glucose range is 80-low 100's.  Patient denies hypoglycemic reactions. He checks his glucose levels 1-2 time(s) per day. Patient does not require insulin.   Medications include: glyburide 5 mg bid,  Diet is healthy overall.  Exercise: active at work  Hypertension Patient presents for hypertension follow up. He does not monitor home blood pressures. He is compliant with medication- losartan 50 mg/d. Patient has these side effects of medication: none Diet/exercise as above. No Cp or SOB.   Hyperlipidemia Patient presents for dyslipidemia follow up. Currently being treated with Zetia 10 mg/d, Tricor 145 mg/d and compliance with treatment thus far has been good. He denies myalgias. Diet/exercise as above.  The patient is not known to have coexisting coronary artery disease.   Past Medical History:  Diagnosis Date   Allergy    Arthritis    Biceps tendon rupture    bilateral   Clostridium difficile infection 2014   Colon polyps    Diabetes (HCC)    Borderline/Pre-diabetes   GERD (gastroesophageal reflux disease)    History of DVT (deep vein thrombosis)    History of pulmonary embolism    Hyperlipidemia    Hypertension    Internal bleeding hemorrhoids 2012   "might bleed once/month; only when I strain"   Internal prolapsed hemorrhoids    Leg cramps    Neuromuscular disorder (HCC)    leg cramps    Testicular hypofunction    Wart of face    Right cheek     Related testing: Retinal exam: Due Pneumovax: done  Objective:  BP 118/80 (BP Location: Left Arm, Patient Position: Sitting, Cuff Size: Large)   Pulse 91   Temp 98.5 F (36.9 C) (Oral)   Ht 5\' 8"  (1.727 m)   Wt 245 lb (111.1 kg)   SpO2 95%   BMI 37.25 kg/m  General:  Well developed, well nourished, in no apparent distress Skin:  Warm, no pallor  or diaphoresis Head:  Normocephalic, atraumatic Eyes:  Pupils equal and round, sclera anicteric without injection  Lungs:  CTAB, no access msc use Cardio:  RRR, no bruits, no LE edema Musculoskeletal:  Symmetrical muscle groups noted without atrophy or deformity Neuro:  Sensation intact to pinprick on feet Psych: Age appropriate judgment and insight  Assessment:   Type 2 diabetes mellitus with obesity (HCC) - Plan: glyBURIDE (DIABETA) 5 MG tablet, Comprehensive metabolic panel, Lipid panel, Hemoglobin A1c  Essential hypertension  Mixed hyperlipidemia   Plan:   Chronic, stable. Cont Trulicity 1.5 mg/week, glyburide 5 mg bid. Self pa for now, will have him get an eye exam when he is on Mdr. Counseled on diet and exercise. Chronic, stable. Cont losartan 50 mg/d.  Chronic, hopefully stable. Cont Tricor 145 mg/d, Zetia 10 mg/d.  F/u in 6 mo. The patient voiced understanding and agreement to the plan.  Jilda Roche [place], DO 08/20/23 1:44 PM

## 2023-08-20 NOTE — Patient Instructions (Signed)
Give us 2-3 business days to get the results of your labs back.   Keep the diet clean and stay active.  I recommend getting the flu shot in mid October. This suggestion would change if the CDC comes out with a different recommendation.   Let us know if you need anything. 

## 2023-08-21 ENCOUNTER — Other Ambulatory Visit: Payer: Self-pay | Admitting: Family Medicine

## 2023-08-21 ENCOUNTER — Encounter: Payer: Self-pay | Admitting: Family Medicine

## 2023-08-21 DIAGNOSIS — R748 Abnormal levels of other serum enzymes: Secondary | ICD-10-CM

## 2023-08-21 LAB — COMPREHENSIVE METABOLIC PANEL
ALT: 58 U/L — ABNORMAL HIGH (ref 0–53)
AST: 42 U/L — ABNORMAL HIGH (ref 0–37)
Albumin: 4.2 g/dL (ref 3.5–5.2)
Alkaline Phosphatase: 60 U/L (ref 39–117)
BUN: 14 mg/dL (ref 6–23)
CO2: 29 meq/L (ref 19–32)
Calcium: 10.1 mg/dL (ref 8.4–10.5)
Chloride: 105 mEq/L (ref 96–112)
Creatinine, Ser: 1.21 mg/dL (ref 0.40–1.50)
GFR: 63.53 mL/min (ref >60.00)
Glucose, Bld: 75 mg/dL (ref 70–99)
Potassium: 4.8 mEq/L (ref 3.5–5.1)
Sodium: 141 mEq/L (ref 135–145)
Total Bilirubin: 0.6 mg/dL (ref 0.2–1.2)
Total Protein: 6.8 g/dL (ref 6.0–8.3)

## 2023-08-21 LAB — LIPID PANEL
Cholesterol: 176 mg/dL (ref 0–200)
HDL: 37.6 mg/dL — ABNORMAL LOW (ref >39.00)
LDL Cholesterol: 111 mg/dL — ABNORMAL HIGH (ref 0–99)
NonHDL: 138.2
Total CHOL/HDL Ratio: 5
Triglycerides: 135 mg/dL (ref 0.0–149.0)
VLDL: 27 mg/dL (ref 0.0–40.0)

## 2023-08-21 LAB — HEMOGLOBIN A1C: Hgb A1c MFr Bld: 6.3 % (ref 4.6–6.5)

## 2023-09-10 ENCOUNTER — Other Ambulatory Visit (INDEPENDENT_AMBULATORY_CARE_PROVIDER_SITE_OTHER): Payer: Self-pay

## 2023-09-10 DIAGNOSIS — R748 Abnormal levels of other serum enzymes: Secondary | ICD-10-CM

## 2023-09-10 LAB — HEPATIC FUNCTION PANEL
ALT: 49 U/L (ref 0–53)
AST: 32 U/L (ref 0–37)
Albumin: 3.8 g/dL (ref 3.5–5.2)
Alkaline Phosphatase: 52 U/L (ref 39–117)
Bilirubin, Direct: 0.1 mg/dL (ref 0.0–0.3)
Total Bilirubin: 0.7 mg/dL (ref 0.2–1.2)
Total Protein: 6.2 g/dL (ref 6.0–8.3)

## 2023-09-30 ENCOUNTER — Ambulatory Visit: Payer: Self-pay

## 2023-10-03 ENCOUNTER — Encounter: Payer: Self-pay | Admitting: Family Medicine

## 2023-10-03 ENCOUNTER — Telehealth: Payer: Self-pay | Admitting: Family Medicine

## 2023-10-03 NOTE — Telephone Encounter (Signed)
Pt called back with the following Fax info:  Name: [employer]  Fax: [phone]

## 2023-10-03 NOTE — Telephone Encounter (Signed)
Faxed form to number given [phone] Received fax confirmation.

## 2023-10-03 NOTE — Telephone Encounter (Signed)
Received document PCP completed No fax number on the chart. Called the patients wife left msg to call back to get the fax number.

## 2023-10-03 NOTE — Telephone Encounter (Signed)
Pt's spouse dropped off document to be filled out by provider, Assistant Enrollment form- in a yellow envelope) Pt would like document to be faxed when ready. Document put at front office tray under providers name.

## 2023-10-10 ENCOUNTER — Telehealth: Payer: Self-pay | Admitting: Family Medicine

## 2023-10-10 NOTE — Telephone Encounter (Signed)
[employer] Patient Assistance Program called stating that they had received the form for assistance regarding pt's Xarelto but was missing the following info:  -ICD Code -Sig of medicaction

## 2023-10-10 NOTE — Telephone Encounter (Signed)
Additional information on form has been completed and faxed Received fax confirmation

## 2023-10-22 ENCOUNTER — Encounter: Payer: Self-pay | Admitting: Nurse Practitioner

## 2023-10-22 ENCOUNTER — Ambulatory Visit (INDEPENDENT_AMBULATORY_CARE_PROVIDER_SITE_OTHER): Payer: Self-pay | Admitting: Nurse Practitioner

## 2023-10-22 VITALS — BP 138/68 | HR 52 | Ht 68.0 in | Wt 254.0 lb

## 2023-10-22 DIAGNOSIS — K649 Unspecified hemorrhoids: Secondary | ICD-10-CM

## 2023-10-22 DIAGNOSIS — K625 Hemorrhage of anus and rectum: Secondary | ICD-10-CM

## 2023-10-22 DIAGNOSIS — Z860101 Personal history of adenomatous and serrated colon polyps: Secondary | ICD-10-CM

## 2023-10-22 NOTE — Patient Instructions (Addendum)
Apply a small amount of Desitin inside the anal opening and to the external anal area three times daily as needed for anal or hemorrhoidal irritation/bleeding.   Benefiber one tablespoon once daily as tolerated  Take Miralax 1 capful mixed in 8 ounces of water at bed time for constipation as tolerated.  Contact our office if rectal bleeding recurs.  Due to recent changes in healthcare laws, you may see the results of your imaging and laboratory studies on MyChart before your provider has had a chance to review them.  We understand that in some cases there may be results that are confusing or concerning to you. Not all laboratory results come back in the same time frame and the provider may be waiting for multiple results in order to interpret others.  Please give [place] 48 hours in order for your provider to thoroughly review all the results before contacting the office for clarification of your results.   Thank you for trusting me with your gastrointestinal care!   Alcide Evener, CRNP

## 2023-10-22 NOTE — Progress Notes (Signed)
10/22/2023 Ian [name] [id number] January 28, 1961   Chief Complaint: Follow-up rectal bleeding, hemorrhoids  History of Present Illness: Ian [name] . Ian [name] is a 62 year old male with a past medical history of hypertension, hyperlipidemia, diabetes mellitus type 2, DVT on Xarelto, GERD, constipation, hemorrhoids and colon polyps.  He was last seen in office by Dr. Leonides Schanz 07/13/2023 secondary to constipation and rectal bleeding.  At that time, on exam he had internal hemorrhoids with active bleeding and he was prescribed Anusol HC suppositories daily for 7 days with recommendations for hemorrhoidal banding if no improvement.  He presents today for further follow-up.  His rectal bleeding nearly abated after utilizing Anusol suppositories for 1 week but he noted having 1 episode of a small amount of blood on the toilet tissue 1 or 2 weeks later which abated without further recurrence.  He denies having any abdominal or rectal pain.  He sometimes strains a bit to pass a bowel movement.  He does not wish to undergo hemorrhoid banding at this time as he is uninsured until he has Medicare 08/2024.  His most recent colonoscopy was completed by Dr. Orvan Falconer 03/22/2021 which identified 3 small hyperplastic polyps and internal hemorrhoids.  He was advised to repeat a colonoscopy 02/2026.  He remains on Xarelto for history of DVT.  No GERD symptoms on Omeprazole 40 mg daily.     Latest Ref Rng & Units 07/02/2023    3:14 PM 05/14/2020    5:14 AM 05/10/2020   10:35 AM  CBC  WBC 4.0 - 10.5 K/uL 7.6  9.6  5.3   Hemoglobin 13.0 - 17.0 g/dL 84.1  66.0  63.0   Hematocrit 39.0 - 52.0 % 45.7  38.2  45.4   Platelets 150.0 - 400.0 K/uL 157.0  141  145        Latest Ref Rng & Units 09/10/2023    9:51 AM 08/20/2023    1:57 PM 02/05/2023   11:53 AM  CMP  Glucose 70 - 99 mg/dL  75  85   BUN 6 - 23 mg/dL  14  13   Creatinine 1.60 - 1.50 mg/dL  1.09  3.23   Sodium 557 - 145 mEq/L  141  140   Potassium 3.5 - 5.1 mEq/L  4.8   4.0   Chloride 96 - 112 mEq/L  105  107   CO2 19 - 32 mEq/L  29  24   Calcium 8.4 - 10.5 mg/dL  32.2  9.5   Total Protein 6.0 - 8.3 g/dL 6.2  6.8  6.5   Total Bilirubin 0.2 - 1.2 mg/dL 0.7  0.6  0.8   Alkaline Phos 39 - 117 U/L 52  60  51   AST 0 - 37 U/L 32  42  29   ALT 0 - 53 U/L 49  58  36      PAST GI PROCEDURES: - Colonoscopy 2012: tubular adenoma and hyperplastic polyps - Colonoscopy with Dr. Myra Gianotti 08/17/14 revealed internal hemorrhoids but was otherwise normal. Repeat colonoscopy recommended in 5 years.  - Preprocedure office visit notes a history of hemorrhoids, anal fissure, and colon cancer in a grandparent.  - Hemorrhoidectomy, fissurectomy and internal sphincterotomy 06/24/13 with Dr. Charlena Cross.   - Colonoscopy 03/22/21: 3 small hyperplastic polyps. Internal hemorrhoids. 5 year recall colonoscopy.    Past Medical History:  Diagnosis Date   Allergy    Arthritis    Biceps tendon rupture    bilateral   Clostridium  difficile infection 2014   Colon polyps    Diabetes (HCC)    Borderline/Pre-diabetes   GERD (gastroesophageal reflux disease)    History of DVT (deep vein thrombosis)    History of pulmonary embolism    Hyperlipidemia    Hypertension    Internal bleeding hemorrhoids 2012   "might bleed once/month; only when I strain"   Internal prolapsed hemorrhoids    Leg cramps    Neuromuscular disorder (HCC)    leg cramps    Testicular hypofunction    Wart of face    Right cheek   Past Surgical History:  Procedure Laterality Date   ANAL FISSURE REPAIR  06/24/2013   APPENDECTOMY  2011   BACK SURGERY     COLONOSCOPY W/ BIOPSIES AND POLYPECTOMY  2012   COLONSCOPY  2015   EXCISIONAL HEMORRHOIDECTOMY     FISSURECTOMY     HEMORRHOID SURGERY  06/24/2013   internal sphincterotomy  06-24-2013   LUMBAR LAMINECTOMY/DECOMPRESSION MICRODISCECTOMY N/A 01/20/2015   Procedure: MICRO LUMBAR DECOMPRESSION L4-5/L3-4/L2-3;  Surgeon: Javier Docker, MD;  Location: [facility];   Service: Orthopedics;  Laterality: N/A;   QUADRICEPS TENDON REPAIR Left 05/13/2020   Procedure: REPAIR QUADRICEP TENDON;  Surgeon: Jene Every, MD;  Location: [facility];  Service: Orthopedics;  Laterality: Left;   SHOULDER ARTHROSCOPY  08/22/2012   Procedure: ARTHROSCOPY SHOULDER;  Surgeon: Senaida Lange, MD;  Location: [facility];  Service: Orthopedics;  Laterality: Left;  Left shoulder arthroscopic lavage and debridement   SHOULDER ARTHROSCOPY  09/02/2012   Procedure: ARTHROSCOPY SHOULDER;  Surgeon: Senaida Lange, MD;  Location: [facility];  Service: Orthopedics;  Laterality: Left;  Left shoulder arthroscopy irrigation and debridement   SHOULDER ARTHROSCOPY W/ ROTATOR CUFF REPAIR  07/09/2012   left   SHOULDER OPEN ROTATOR CUFF REPAIR Right 12/13/2017   Procedure: Right shoulder mini open rotator cuff repair;  Surgeon: Jene Every, MD;  Location: [facility];  Service: Orthopedics;  Laterality: Right;  Interscalene Block   SKIN LESION EXCISION     Benign   WISDOM TOOTH EXTRACTION  YRS AGO   Current Outpatient Medications on File Prior to Visit  Medication Sig Dispense Refill   Blood Glucose Monitoring Suppl (RELION CONFIRM GLUCOSE MONITOR) W/DEVICE KIT USE AS DIRECTED TO TEST BLOOD GLUCOSE DAILY DX: 250.00 1 kit 0   Cholecalciferol (VITAMIN D-3) 5000 UNITS TABS Take 5,000 Units by mouth daily.      cyclobenzaprine (FLEXERIL) 10 MG tablet TAKE ONE TABLET BY MOUTH THREE TIMES A DAY AS NEEDED MUSCLE SPASMS 90 tablet 1   Dulaglutide (TRULICITY) 1.5 MG/0.5ML SOPN Inject 1.5 mg into the skin once a week. 6 mL 3   EPIPEN 2-PAK 0.3 MG/0.3ML SOAJ injection Inject 0.3 mg into the skin once.     ezetimibe (ZETIA) 10 MG tablet Take 1 tablet (10 mg total) by mouth daily. 90 tablet 3   fenofibrate (TRICOR) 145 MG tablet TAKE 1 TABLET BY MOUTH DAILY 90 tablet 5   fexofenadine (ALLEGRA) 180 MG tablet Take 180 mg by mouth daily.     glyBURIDE (DIABETA) 5 MG tablet Take 1 tablet (5 mg total) by mouth 2 (two) times daily  with a meal. (Patient taking differently: Take 5 mg by mouth daily with breakfast.) 180 tablet 4   HYDROcodone-acetaminophen (NORCO) 5-325 MG tablet Take 1 tablet by mouth every 6 (six) hours as needed for moderate pain. 30 tablet 0   losartan (COZAAR) 50 MG tablet TAKE 1 TABLET BY MOUTH DAILY 90 tablet  0   Magnesium 400 MG TABS Take 800 mg by mouth daily.      mometasone (NASONEX) 50 MCG/ACT nasal spray Place 2 sprays into the nose daily. (Patient taking differently: Place 2 sprays into the nose daily as needed.) 1 each 12   omeprazole (PRILOSEC) 40 MG capsule Take 1 capsule (40 mg total) by mouth daily. 90 capsule 2   potassium gluconate 595 (99 K) MG TABS tablet Take 595 mg by mouth daily.      XARELTO 20 MG TABS tablet TAKE ONE TABLET BY MOUTH DAILY WITH SUPPER 90 tablet 1   No current facility-administered medications on file prior to visit.   Allergies  Allergen Reactions   Penicillins Rash, Other (See Comments), Diarrhea and Hives    Has patient had a PCN reaction causing immediate rash, facial/tongue/throat swelling, SOB or lightheadedness with hypotension: Unknown Has patient had a PCN reaction causing severe rash involving mucus membranes or skin necrosis: No Has patient had a PCN reaction that required hospitalization: No Has patient had a PCN reaction occurring within the last 10 years: No If all of the above answers are "NO", then may proceed with Cephalosporin use.    Tetanus Toxoid, Adsorbed Anaphylaxis   Tetanus Toxoids Anaphylaxis   Aspirin Nausea Only and Rash   Daptomycin Other (See Comments)    myalgias   Prednisone Other (See Comments) and Rash    Hyperactivity WITH ORAL, CAN TAKE SHOTS-Cannot do Dose pack   Bioflavonoids Diarrhea    All citrus fruits   Lisinopril Cough    ACEi cough   Other     Cheese   Tetanus Immune Globulin    Citrus Diarrhea and Other (See Comments)    All citrus fruits   Fenofibrate Other (See Comments)    Constipation w/anal fissure  [Sx] Other reaction(s): Constipation   Milk-Related Compounds Diarrhea and Other (See Comments)    Milk and milk products   Oxycodone Other (See Comments)    Does not help pain just makes him high   Current Medications, Allergies, Past Medical History, Past Surgical History, Family History and Social History were reviewed in [employer] record.  Review of Systems:   Constitutional: Negative for fever, sweats, chills or weight loss.  Respiratory: Negative for shortness of breath.   Cardiovascular: Negative for chest pain, palpitations and leg swelling.  Gastrointestinal: See HPI.  Musculoskeletal: Negative for back pain or muscle aches.  Neurological: Negative for dizziness, headaches or paresthesias.   Physical Exam: BP 138/68   Pulse (!) 52   Ht 5\' 8"  (1.727 m)   Wt 254 lb (115.2 kg)   SpO2 96%   BMI 38.62 kg/m   General: 62 year old male in no acute distress. Head: Normocephalic and atraumatic. Eyes: No scleral icterus. Conjunctiva pink . Ears: Normal auditory acuity. Lungs: Clear throughout to auscultation. Heart: Regular rate and rhythm, no murmur. Abdomen: Soft, nontender and nondistended. No masses or hepatomegaly. Normal bowel sounds x 4 quadrants.  Rectal: Deferred.  Musculoskeletal: Symmetrical with no gross deformities. Extremities: No edema. Neurological: Alert oriented x 4. No focal deficits.  Psychological: Alert and cooperative. Normal mood and affect  Assessment and Recommendations:   62 year old male with rectal bleeding secondary to internal hemorrhoids abated after using Anusol suppositories for 1 week.  -Benefiber 1 tablespoon daily -Take Miralax 1 capful mixed in 8 ounces of water at bed time for constipation as tolerated. -Apply a small amount of Desitin inside the anal opening and to the external  anal area three times daily as needed for anal or hemorrhoidal irritation/bleeding.  -Consider future hemorrhoid banding after  patient has Medicare 08/2024 -Consider an earlier colonoscopy than recall date 02/2026 if rectal bleeding persists or worsens  History of tubular adenomatous and hyperplastic colon polyps.  His most recent colonoscopy 02/2021 identified 3 small hyperplastic polyps removed from the colon. -Next colonoscopy due 02/2026, earlier if rectal bleeding persists or worsens  History of DVT on Xarelto

## 2023-10-22 NOTE — Progress Notes (Signed)
I agree with the assessment and plan as outlined by Ms. Kennedy-Smith. 

## 2023-10-29 ENCOUNTER — Other Ambulatory Visit: Payer: Self-pay | Admitting: Family Medicine

## 2023-11-05 ENCOUNTER — Telehealth: Payer: Self-pay | Admitting: Family Medicine

## 2023-11-05 NOTE — Telephone Encounter (Signed)
Forms will be placed in bin for Tammy pharmacist.

## 2023-11-05 NOTE — Telephone Encounter (Signed)
Pt dropped off document to be filled out by provider (Pt Assistance Enrollment Program -6 pages - Section 2 is needing to be completely filled out by provider) Pt states this is the 4th time submitting document, if any question to call his wife Lesle Reek at [phone]. Document can be faxed when ready to Fax # [phone]. Document put at front office tray under providers name.

## 2023-11-07 ENCOUNTER — Ambulatory Visit (INDEPENDENT_AMBULATORY_CARE_PROVIDER_SITE_OTHER): Payer: Self-pay | Admitting: Pharmacist

## 2023-11-07 DIAGNOSIS — Z7901 Long term (current) use of anticoagulants: Secondary | ICD-10-CM

## 2023-11-07 DIAGNOSIS — Z79899 Other long term (current) drug therapy: Secondary | ICD-10-CM

## 2023-11-07 NOTE — Progress Notes (Signed)
11/07/2023 Name: Ian [name] MRN: [id number] DOB: [birth year]  Chief Complaint  Patient presents with   Medication Management    Ian [name] is a 62 y.o. year old male who presented for a telephone visit.   They were referred to the pharmacist by their PCP for assistance in managing medication access.    Subjective: On chronic anticoagulation due to history of thrombus in the left leg and bilateral PE's.  PE's had resolved on CT angio in August 2016. 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 Per noted from hematology he did develop his thrombus after having surgery and being immobile for an extended period of time.  Patient has been receiving Xarleto in 2024 thru the [place] medication assistance program, however he is having difficulty with paperwork for re-enrollment. Application for forward to me by PCP to assist patient.   Patient endorses he has about 2.5 months of Xarelto on hand currently (received from [facility] a few weeks ago)  Medication Access/Adherence  Current Pharmacy:  [id number] - [place] - [address] [place] [address] Phone: [phone] Fax: [phone]  [facility] - [place], [place] - [address] [place] [address] Phone: [phone] Fax: [phone]   Patient reports affordability concerns with their medications: Yes  - patient has not insurance at this time.  Patient reports access/transportation concerns to their pharmacy: No  Patient reports adherence concerns with their medications:  No      Objective:  Lab Results  Component Value Date   HGBA1C 6.3 08/20/2023    Lab Results  Component Value Date   CREATININE 1.21 08/20/2023   BUN 14 08/20/2023   NA 141 08/20/2023   K  4.8 08/20/2023   CL 105 08/20/2023   CO2 29 08/20/2023    Lab Results  Component Value Date   CHOL 176 08/20/2023   HDL 37.60 (L) 08/20/2023   LDLCALC 111 (H) 08/20/2023   LDLDIRECT 120.0 05/10/2021   TRIG 135.0 08/20/2023   CHOLHDL 5 08/20/2023    Medications Reviewed Today     Reviewed by Henrene Pastor, RPH-CPP (Pharmacist) on 11/07/23 at 1123  Med List Status: <None>   Medication Order Taking? Sig Documenting Provider Last Dose Status Informant  Blood Glucose Monitoring Suppl (RELION CONFIRM GLUCOSE MONITOR) W/DEVICE KIT [id number] No USE AS DIRECTED TO TEST BLOOD GLUCOSE DAILY DX: 250.00 Waldon Merl, PA-C Taking Active Self  Cholecalciferol (VITAMIN D-3) 5000 UNITS TABS [id number] No Take 5,000 Units by mouth daily.  [provider] Taking Active Self  cyclobenzaprine (FLEXERIL) 10 MG tablet [id number] No TAKE ONE TABLET BY MOUTH THREE TIMES A DAY AS NEEDED MUSCLE SPASMS Wendling, Jilda Roche, DO Taking Active   Dulaglutide (TRULICITY) 1.5 MG/0.5ML SOPN [id number] No Inject 1.5 mg into the skin once a week. Sharlene Dory, DO Taking Active            Med Note Clydie Braun, Glenna Durand   Wed Nov 07, 2023 11:23 AM) [facility]  EPIPEN 2-PAK 0.3 MG/0.3ML SOAJ injection [id number] No Inject 0.3 mg into the skin once. [provider] Taking Active Self           Med Note Richardean Canal Apr 29, 2020  3:41 PM)    ezetimibe (ZETIA) 10 MG tablet [id number] No Take 1 tablet (10 mg total) by mouth daily. Sharlene Dory, DO Taking Active   fenofibrate (TRICOR) 145 MG tablet [id number] No TAKE 1 TABLET BY MOUTH DAILY Sharlene Dory, DO Taking Active   fexofenadine (ALLEGRA) 180 MG tablet [id number] No Take 180 mg by mouth daily. [provider] Taking Active   glyBURIDE (DIABETA) 5 MG tablet [id number] No Take 1 tablet (5 mg total) by mouth 2 (two) times daily with a meal.  Patient taking differently: Take 5 mg by mouth daily with  breakfast.   Sharlene Dory, DO Taking Active   HYDROcodone-acetaminophen (NORCO) 5-325 MG tablet [id number] No Take 1 tablet by mouth every 6 (six) hours as needed for moderate pain. Sharlene Dory, DO Taking Active   losartan (COZAAR) 50 MG tablet [id number] No TAKE 1 TABLET BY MOUTH DAILY Sharlene Dory, DO Taking Active   Magnesium 400 MG TABS [id number] No Take 800 mg by mouth daily.  [provider] Taking Active Self  mometasone (NASONEX) 50 MCG/ACT nasal spray [id number] No Place 2 sprays into the nose daily.  Patient taking differently: Place 2 sprays into the nose daily as needed.   Donato Schultz, DO Taking Active   omeprazole (PRILOSEC) 40 MG capsule [id number] No Take 1 capsule (40 mg total) by mouth daily. Sharlene Dory, DO Taking Active   potassium gluconate 595 (99 K) MG TABS tablet [id number] No Take 595 mg by mouth daily.  [provider] Taking Active Self  XARELTO 20 MG TABS tablet [id number]  TAKE 1 TABLET BY MOUTH DAILY WITH SUPPER Sharlene Dory, DO  Active            Med Note Clydie Braun, Juanna Cao Nov 07, 2023 11:23 AM) [place] program              Assessment/Plan:   Medication Management / access:  - Reviewed patient's [place] medication assistance program application for [employer] . It look like some portions are not filled out correctly for provider portion. Reprinted provider portion and completed form. Forward to Dr Carmelia Roller to review and sign.  - Discussed with patient the he can have Xarelto delivered to his home instead of to [employer]. Patient would like to change to have delivered to his home. Updated application to refect this. - per patient he has completed application for [employer] / Trulicity already.    Follow Up Plan: January 2025 to check on patient assistance program application  Henrene Pastor, PharmD Clinical Pharmacist [facility]  [facility]

## 2023-11-07 NOTE — Telephone Encounter (Signed)
Forms updated and completed. Forwarded to PCP to review and sign.

## 2023-11-08 ENCOUNTER — Other Ambulatory Visit: Payer: Self-pay | Admitting: Family Medicine

## 2023-11-10 ENCOUNTER — Encounter: Payer: Self-pay | Admitting: Family Medicine

## 2023-11-12 NOTE — Telephone Encounter (Signed)
Flu shot recorded

## 2023-11-13 NOTE — Progress Notes (Signed)
11/13/2023 - patient's 2025 medication assistance program application for Xarelto has been faxed to [employer] on 11/12/2023  Henrene Pastor, PharmD Clinical Pharmacist [facility]  Population Health [phone]

## 2023-12-05 ENCOUNTER — Telehealth: Payer: Self-pay | Admitting: Pharmacist

## 2023-12-05 NOTE — Telephone Encounter (Signed)
Received fax from [place] patient assistance program that patient has been approved to receive Xarelto. Fax notes that patient should be contacted in the next 7 days to schedule his first / next delivery.  Patient was notified. He is aware to contact [employer] at [phone] if he does not received a call in the next 7 days.

## 2024-01-02 ENCOUNTER — Ambulatory Visit (INDEPENDENT_AMBULATORY_CARE_PROVIDER_SITE_OTHER): Payer: Self-pay | Admitting: Pharmacist

## 2024-01-02 DIAGNOSIS — E119 Type 2 diabetes mellitus without complications: Secondary | ICD-10-CM

## 2024-01-02 DIAGNOSIS — Z79899 Other long term (current) drug therapy: Secondary | ICD-10-CM

## 2024-01-02 DIAGNOSIS — E669 Obesity, unspecified: Secondary | ICD-10-CM

## 2024-01-02 DIAGNOSIS — E1169 Type 2 diabetes mellitus with other specified complication: Secondary | ICD-10-CM

## 2024-01-02 DIAGNOSIS — Z7985 Long-term (current) use of injectable non-insulin antidiabetic drugs: Secondary | ICD-10-CM

## 2024-01-02 NOTE — Progress Notes (Signed)
 01/02/2024 Name: Ian [name] MRN: [id number] DOB: [birth year]  Chief Complaint  Patient presents with   Medication Management    Ian [name] is a 63 y.o. year old male who presented for a telephone visit.   They were referred to the pharmacist by their PCP for assistance in managing medication access.    Subjective: On chronic anticoagulation due to history of thrombus in the left leg and bilateral PE's.  PE's had resolved on CT angio in August 2016. 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 Per noted from hematology he did develop his thrombus after having surgery and being immobile for an extended period of time.  Patient has been receiving Xarleto in 2024 thru the [place] and [place] medication assistance program. Assisted patient with re-enrollment at end of 2024. Patient has been approved to continue to received Xarelto  thru 11/2024  He has also been approved to receive Trulicist thru [facility] medication assistance program thru 07/2023.   Medication Access/Adherence  Current Pharmacy:  [id number] - [place], [place] - [address] [place] [address] Phone: [phone] Fax: [phone]  [facility] - [place], [place] - [address] [place] [address] Phone: [phone] Fax: [phone]   Patient reports affordability concerns with their medications: Yes  - patient has no insurance at this time. Will be eligible for Medicare 08/2024. Patient reports access/transportation concerns to their pharmacy: No  Patient reports adherence concerns with their medications:  No    Diabetes:  Current medications: Trulicity  1.5mg  weekly; glyburide  5mg  twice a day per on med list but per patient he is only taking 1 tablet daily with evening  meal.  Medications tried in the past: metformin  - caused diarrhea  Current glucose readings: 75 to 160's  Checking blood glucose daily  Patient reports morning blood glucose in 70's about once per week. Denies s/s of hypoglycemia.He reports he tried stopping glyburide  in past but his blood glucose got all out of whack   Current medication access support: [employer] for Trulicity  thru 07/2024  Objective:  Lab Results  Component Value Date   HGBA1C 6.3 08/20/2023    Lab Results  Component Value Date   CREATININE 1.21 08/20/2023   BUN 14 08/20/2023   NA 141 08/20/2023   K 4.8 08/20/2023   CL 105 08/20/2023   CO2 29 08/20/2023    Lab Results  Component Value Date   CHOL 176 08/20/2023   HDL 37.60 (L) 08/20/2023   LDLCALC 111 (H) 08/20/2023   LDLDIRECT 120.0 05/10/2021   TRIG 135.0 08/20/2023   CHOLHDL 5 08/20/2023    Medications Reviewed Today   Medications were not reviewed in this encounter       Assessment/Plan:    Diabetes: Currently A1c is at goal of < 7.0% but patient is having occasional blood glucose in morning < 80.  - Reviewed goal A1c, goal fasting glucose - Discussed possibly lowering dose of glyburide  to 2.5mg  daily - patient did not want to change because in past he had increase in blood glucose when glyburide  was stopped. He will discuss further with Dr Frann at appointment in Feb 2025. There is room to increase Trulicity  dose to 3.0 mg weekly in future if dose of glyburide  was lowered but would  needed to send in new Rx to [employer].  - Recommend to continue current regimen.   Medication Management / access:  - Patient has been approved thru 11/2024 to received Xarelto  from [place] medication assistance program.  - Patient has been approved thru 07/2024 to received Trulicisty from [employer] medication assistance program.  - Discussed that he will be eligible for Medicare 08/2024. His wife works for a company  that can assist with choosing a Medicare plan. Patient will contact me if any further assistance is needed with medications / medication costs.    Follow Up Plan: patient to contact Clinical Pharmacist Practitioner in future as needed.   Madelin Ray, PharmD Clinical Pharmacist [facility] [place]              Objective:  Lab Results  Component Value Date   HGBA1C 6.3 08/20/2023    Lab Results  Component Value Date   CREATININE 1.21 08/20/2023   BUN 14 08/20/2023   NA 141 08/20/2023   K 4.8 08/20/2023   CL 105 08/20/2023   CO2 29 08/20/2023    Lab Results  Component Value Date   CHOL 176 08/20/2023   HDL 37.60 (L) 08/20/2023   LDLCALC 111 (H) 08/20/2023   LDLDIRECT 120.0 05/10/2021   TRIG 135.0 08/20/2023   CHOLHDL 5 08/20/2023    Medications Reviewed Today   Medications were not reviewed in this encounter

## 2024-02-06 ENCOUNTER — Other Ambulatory Visit: Payer: Self-pay | Admitting: Family Medicine

## 2024-02-18 ENCOUNTER — Ambulatory Visit (INDEPENDENT_AMBULATORY_CARE_PROVIDER_SITE_OTHER): Payer: Self-pay | Admitting: Family Medicine

## 2024-02-18 ENCOUNTER — Encounter: Payer: Self-pay | Admitting: Family Medicine

## 2024-02-18 VITALS — BP 130/76 | HR 70 | Temp 98.0°F | Resp 16 | Ht 68.0 in | Wt 252.0 lb

## 2024-02-18 DIAGNOSIS — M25562 Pain in left knee: Secondary | ICD-10-CM

## 2024-02-18 DIAGNOSIS — I10 Essential (primary) hypertension: Secondary | ICD-10-CM

## 2024-02-18 DIAGNOSIS — E119 Type 2 diabetes mellitus without complications: Secondary | ICD-10-CM

## 2024-02-18 DIAGNOSIS — G8929 Other chronic pain: Secondary | ICD-10-CM

## 2024-02-18 DIAGNOSIS — Z7985 Long-term (current) use of injectable non-insulin antidiabetic drugs: Secondary | ICD-10-CM

## 2024-02-18 LAB — COMPREHENSIVE METABOLIC PANEL
ALT: 41 U/L (ref 0–53)
AST: 34 U/L (ref 0–37)
Albumin: 4.4 g/dL (ref 3.5–5.2)
Alkaline Phosphatase: 48 U/L (ref 39–117)
BUN: 17 mg/dL (ref 6–23)
CO2: 26 meq/L (ref 19–32)
Calcium: 9.5 mg/dL (ref 8.4–10.5)
Chloride: 106 meq/L (ref 96–112)
Creatinine, Ser: 1.02 mg/dL (ref 0.40–1.50)
GFR: 77.71 mL/min (ref >60.00)
Glucose, Bld: 91 mg/dL (ref 70–99)
Potassium: 4.4 meq/L (ref 3.5–5.1)
Sodium: 141 meq/L (ref 135–145)
Total Bilirubin: 1 mg/dL (ref 0.2–1.2)
Total Protein: 6.9 g/dL (ref 6.0–8.3)

## 2024-02-18 LAB — HEMOGLOBIN A1C: Hgb A1c MFr Bld: 6.6 % — ABNORMAL HIGH (ref 4.6–6.5)

## 2024-02-18 LAB — LIPID PANEL
Cholesterol: 175 mg/dL (ref 0–200)
HDL: 36.8 mg/dL — ABNORMAL LOW (ref >39.00)
LDL Cholesterol: 102 mg/dL — ABNORMAL HIGH (ref 0–99)
NonHDL: 137.8
Total CHOL/HDL Ratio: 5
Triglycerides: 177 mg/dL — ABNORMAL HIGH (ref 0.0–149.0)
VLDL: 35.4 mg/dL (ref 0.0–40.0)

## 2024-02-18 MED ORDER — DICLOFENAC SODIUM 1 % EX GEL: 2.0000 g | Freq: Four times a day (QID) | CUTANEOUS | 1 refills | Status: DC | PRN

## 2024-02-18 NOTE — Patient Instructions (Addendum)
 Give [place] 2-3 business days to get the results of your labs back.   Keep the diet clean and stay active.  OK to take Tylenol 1000 mg (2 extra strength tabs) or 975 mg (3 regular strength tabs) every 6 hours as needed.  Ice/cold pack over area for 10-15 min twice daily.  Let us know if you need anything.  Stretching and range of motion exercises These exercises warm up your muscles and joints and improve the movement and flexibility of your knee. These exercises also help to relieve pain and stiffness.  Exercise A: Knee flexion, active Lie on your back with both knees straight. If this causes back discomfort, bend your uninjured knee so your foot is flat on the floor. Slowly slide your left / right heel back toward your buttocks until you feel a gentle stretch in the front of your knee or thigh. Stop if you have pain. Hold for3 seconds. Slowly slide your left / right heel back to the starting position. 10 total repetitions. Repeat 2 times. Complete this exercise 3 times a week.  Exercise B: Knee extension, sitting Sit with your left / right heel propped on a chair, a coffee table, or a footstool. Do not have anything under your knee to support it. Allow your leg muscles to relax, letting gravity straighten out your knee. You should feel a stretch behind your left / right knee. If told by your health care provide just above your kneecap. Hold this position for 3 seconds. Repeat for a total of 10 repetitions. Repeat 2 times. Complete this stretch 3 times a week.  Strengthening exercises These exercises build strength and endurance in your knee. Endurance is the ability to use your muscles for a long time, even after they get tired.  Exercise C: Quadriceps, isometric Lie on your back with your left / right leg extended and your other knee bent. Put a rolled towel or small pillow under your right/left knee if told by your health care provider. Slowly tense the muscles in the front of your  left / right thigh by pushing the back of your knee down. You should see your knee cap slide up toward your hip or see increased dimpling just above the knee. For 3 seconds, keep the muscle as tight as you can without increasing your pain. Relax the muscles slowly and completely. Repeat for 10 total repetitions. Repeat 2 times. Complete this exercise 3 times a week. Exercise D: Straight leg raises (quadriceps) Lie on your back with your left / right leg extended and your other knee bent. Tense the muscles in the front of your left / right thigh. You should see your kneecap slide up or see increased dimpling just above the knee. Keep these muscles tight as you raise your leg 4-6 inches (10-15 cm) off the floor. Hold this position for 3 seconds. Keep these muscles tense as you lower your leg. Relax the muscles slowly and completely. Repeat for a total of 10 repetitions. Repeat 2 times. Complete this exercise 3 times a week.  Exercise E: Hamstring curls On the floor or a bed, lie on your abdomen with your legs straight. Put a folded towel or small pillow under your left / right thigh, just above your kneecap. Slowly bend your left / right knee as far as you can without pain. Keep your hips flat against the floor or bed. Hold this position for 3 seconds. Slowly lower your leg to the starting position. Repeat for a total  of 10 repetitions. Repeat 2 times. Complete this exercise 3 times per week.  Stretching exercises These exercises warm up your muscles and joints and improve the movement and flexibility of your knee. These exercises also help to relieve pain and stiffness.  Exercise A: Quadriceps, prone Lie on your abdomen on a firm surface, such as a bed or padded floor. Bend your left / right knee and hold your ankle. If you cannot reach your ankle or pant leg, loop a belt around your foot and grab the belt instead. Gently pull your heel toward your buttocks. Your knee should not slide out to  the side. You should feel a stretch in the front of your thigh and knee. Hold this position for 30 seconds. Repeat 2 times. Complete this stretch 3 times a week.  Exercise B: Hamstring, doorway Lie on your back in front of a doorway with your left / right leg resting against the wall and your other leg flat on the floor in the doorway. There should be a slight bend in your left / right knee. Straighten your left / right knee. You should feel a stretch behind your knee or thigh. If you do not feel that stretch, scoot your buttocks closer to the door. Hold this position for 30 seconds. Repeat 2 times. Complete this stretch 3 times a week.  Strengthening exercises These exercises build strength and endurance in your knee and leg muscles. Endurance is the ability to use your muscles for a long time, even after they get tired.   Exercise D: Wall slides (quadriceps) Lean your back against a smooth wall or door, and walk your feet out 18-24 inches (45-61 cm) from it. Place your feet hip-width apart. Slowly slide down the wall or door until your knees bend 90 degrees. Keep your knees over your heels, not over your toes. Keep your knees in line with your hips. Hold for 2 seconds. Stand up to rest for 60 seconds. Repeat 2 times. Complete this exercise 3 times a week.  Exercise E: Bridge (hip extensors) Lie on your back on a firm surface with your knees bent and your feet flat on the floor. Tighten your buttocks muscles and lift your bottom off the floor until your trunk is level with your thighs. Do not arch your back. You should feel the muscles working in your buttocks and the back of your thighs. Hold this position for 2 seconds. Slowly lower your hips to the starting position. Let your buttocks muscles relax completely between repetitions. Repeat 2 times. Complete this exercise 3 times a week.

## 2024-02-18 NOTE — Progress Notes (Signed)
 Subjective:   Chief Complaint  Patient presents with   Follow-up    Follow up 6 months    Ian [name] is a 63 y.o. male here for follow-up of diabetes.   Ian [name]'s self monitored glucose range is 70-110's.  Patient denies hypoglycemic reactions. He checks his glucose levels 1 time(s) per day. Patient does not require insulin.   Medications include: glyburide 5 mg/d, Trulicity 1.5 mg/week Diet is overall healthy.  Exercise: walking  Hypertension Patient presents for hypertension follow up. He does monitor home blood pressures. Blood pressures ranging on average from 120's/70's. He is compliant with medication- losartan 50 mg/d. Patient has these side effects of medication: none Diet/exercise as above.  No Cp or SOB .  Several mo ago started having L knee pain. No inj or change in activity. Painful over the medial side. No bruising, redness, swelling, decreased ROM. Denies any neuro s/s's, catching/locking. Tried rest, cold packs, heat. Bothersome when he goes up hills, flat surfaces do not bother him.   Past Medical History:  Diagnosis Date   Allergy    Arthritis    Biceps tendon rupture    bilateral   Clostridium difficile infection 2014   Colon polyps    Diabetes (HCC)    Borderline/Pre-diabetes   GERD (gastroesophageal reflux disease)    History of DVT (deep vein thrombosis)    History of pulmonary embolism    Hyperlipidemia    Hypertension    Internal bleeding hemorrhoids 2012   "might bleed once/month; only when I strain"   Internal prolapsed hemorrhoids    Leg cramps    Neuromuscular disorder (HCC)    leg cramps    Testicular hypofunction    Wart of face    Right cheek     Related testing: Retinal exam: due; gets insurance in Sept.  Pneumovax: done  Objective:  BP 130/76 (BP Location: Left Arm, Patient Position: Sitting, Cuff Size: Small)   Pulse 70   Temp 98 F (36.7 C) (Oral)   Resp 16   Ht 5\' 8"  (1.727 m)   Wt 252 lb (114.3 kg)   SpO2 96%    BMI 38.32 kg/m  General:  Well developed, well nourished, in no apparent distress Knee: TTP over medial plica on L, no effusion, jt line ttp, deformity, edema, erythema; neg Lachman's, varus/valgus stress Lungs:  CTAB, no access msc use Cardio:  RRR, no bruits, no LE edema Psych: Age appropriate judgment and insight  Assessment:   Diabetes mellitus treated with injections of non-insulin medication (HCC) - Plan: Comprehensive metabolic panel, Lipid panel, Microalbumin / creatinine urine ratio, Hemoglobin A1c  Essential hypertension  Chronic pain of left knee - Plan: diclofenac Sodium (VOLTAREN) 1 % GEL   Plan:   Chronic, stable. Cont Trulicity 1.5 mg/week, glyburide 5 mg/d. Counseled on diet and exercise. Chronic, stable. Cont losartan 50 mg/d.  Voltaren gel. 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 Tylenol, ice, stretches/exercises.  F/u in 6 mo. The patient voiced understanding and agreement to the plan.  Jilda Roche [place], DO 02/18/24 10:50 AM

## 2024-02-19 ENCOUNTER — Encounter: Payer: Self-pay | Admitting: Family Medicine

## 2024-02-19 LAB — MICROALBUMIN / CREATININE URINE RATIO
Creatinine,U: 163 mg/dL
Microalb Creat Ratio: 4.3 mg/g (ref 0.0–30.0)
Microalb, Ur: <0.7 mg/dL (ref 0.0–1.9)

## 2024-02-20 ENCOUNTER — Other Ambulatory Visit: Payer: Self-pay | Admitting: Family Medicine

## 2024-02-20 MED ORDER — LOVASTATIN 10 MG PO TABS: 10.0000 mg | ORAL_TABLET | Freq: Every day | ORAL | 3 refills | Status: DC

## 2024-02-25 ENCOUNTER — Encounter: Payer: Self-pay | Admitting: Family Medicine

## 2024-02-25 ENCOUNTER — Other Ambulatory Visit: Payer: Self-pay | Admitting: Family Medicine

## 2024-02-25 DIAGNOSIS — M609 Myositis, unspecified: Secondary | ICD-10-CM | POA: Insufficient documentation

## 2024-03-18 ENCOUNTER — Ambulatory Visit (INDEPENDENT_AMBULATORY_CARE_PROVIDER_SITE_OTHER): Payer: Self-pay | Admitting: Family Medicine

## 2024-03-18 ENCOUNTER — Other Ambulatory Visit (HOSPITAL_BASED_OUTPATIENT_CLINIC_OR_DEPARTMENT_OTHER): Payer: Self-pay

## 2024-03-18 ENCOUNTER — Encounter: Payer: Self-pay | Admitting: Family Medicine

## 2024-03-18 VITALS — BP 134/82 | HR 83 | Temp 98.4°F | Ht 68.0 in | Wt 250.6 lb

## 2024-03-18 DIAGNOSIS — L03115 Cellulitis of right lower limb: Secondary | ICD-10-CM

## 2024-03-18 MED ORDER — DOXYCYCLINE HYCLATE 100 MG PO TABS
100.0000 mg | ORAL_TABLET | Freq: Two times a day (BID) | ORAL | 0 refills | Status: DC
  Filled 2024-03-18: qty 20, 10d supply, fill #0

## 2024-03-18 NOTE — Progress Notes (Signed)
 Chief Complaint  Patient presents with   Acute Visit    Patient presents today for lower right leg redness.    Ian [name] is a 63 y.o. male here for a skin complaint.  Duration: 8 days Location: RLE Pruritic? No Painful? Yes Drainage? No Trauma? Yes Other associated symptoms: redness and pain started 3-4 d after initial trauma.  Therapies tried thus far: Mupirocin   Past Medical History:  Diagnosis Date   Allergy    Arthritis    Biceps tendon rupture    bilateral   Clostridium difficile infection 2014   Colon polyps    Diabetes (HCC)    Borderline/Pre-diabetes   GERD (gastroesophageal reflux disease)    History of DVT (deep vein thrombosis)    History of pulmonary embolism    Hyperlipidemia    Hypertension    Internal bleeding hemorrhoids 2012   "might bleed once/month; only when I strain"   Internal prolapsed hemorrhoids    Leg cramps    Neuromuscular disorder (HCC)    leg cramps    Testicular hypofunction    Wart of face    Right cheek    BP 134/82   Pulse 83   Temp 98.4 F (36.9 C)   Ht 5\' 8"  (1.727 m)   Wt 250 lb 9.6 oz (113.7 kg)   SpO2 98%   BMI 38.10 kg/m  Gen: awake, alert, appearing stated age Lungs: No accessory muscle use Skin: +warmth and ttp. No drainage, fluctuance, excoriation Psych: Age appropriate judgment and insight    Cellulitis of right lower extremity - Plan: doxycycline (VIBRA-TABS) 100 MG tablet  10 d of doxy. Keep c/d. Warning signs and symptoms verbalized and written down in AVS. Tylenol.  F/u prn. The patient voiced understanding and agreement to the plan.  Jilda Roche [place], [place] 03/18/24 2:37 PM

## 2024-03-18 NOTE — Patient Instructions (Addendum)
 When you do wash it, use only soap and water. Do not vigorously scrub. Apply triple antibiotic ointment (like Neosporin) twice daily. Keep the area clean and dry.   Things to look out for: increasing pain not relieved by acetaminophen, fevers, spreading redness, drainage of pus, or foul odor.  Go back on Flonase.   Let us know if you need anything.

## 2024-03-23 ENCOUNTER — Encounter: Payer: Self-pay | Admitting: Family Medicine

## 2024-04-15 LAB — HM DIABETES EYE EXAM

## 2024-04-17 ENCOUNTER — Encounter: Payer: Self-pay | Admitting: Family Medicine

## 2024-05-05 ENCOUNTER — Other Ambulatory Visit: Payer: Self-pay | Admitting: Family Medicine

## 2024-05-09 ENCOUNTER — Encounter: Payer: Self-pay | Admitting: Family Medicine

## 2024-05-09 DIAGNOSIS — L609 Nail disorder, unspecified: Secondary | ICD-10-CM

## 2024-05-13 MED ORDER — MUPIROCIN CALCIUM 2 % EX CREA: 1.0000 | TOPICAL_CREAM | Freq: Two times a day (BID) | CUTANEOUS | 0 refills | Status: AC

## 2024-05-21 ENCOUNTER — Encounter: Payer: Self-pay | Admitting: Family Medicine

## 2024-05-21 ENCOUNTER — Ambulatory Visit: Payer: Self-pay

## 2024-05-21 ENCOUNTER — Ambulatory Visit (INDEPENDENT_AMBULATORY_CARE_PROVIDER_SITE_OTHER): Payer: Self-pay | Admitting: Family Medicine

## 2024-05-21 VITALS — BP 122/80 | HR 98 | Temp 99.0°F | Resp 16 | Ht 68.0 in | Wt 250.4 lb

## 2024-05-21 DIAGNOSIS — K529 Noninfective gastroenteritis and colitis, unspecified: Secondary | ICD-10-CM

## 2024-05-21 MED ORDER — ONDANSETRON 4 MG PO TBDP
4.0000 mg | ORAL_TABLET | Freq: Three times a day (TID) | ORAL | 0 refills | Status: DC | PRN
Start: 2024-05-21 — End: 2024-08-26

## 2024-05-21 MED ORDER — AZITHROMYCIN 500 MG PO TABS: 500.0000 mg | ORAL_TABLET | Freq: Every day | ORAL | 0 refills | Status: AC

## 2024-05-21 NOTE — Progress Notes (Signed)
 Chief Complaint  Patient presents with   Fever   Diarrhea    Diarrhea and fever     Subjective Ian [name] is a 63 y.o. male who presents with nausea and diarrhea. Here w wife.  Symptoms began 1 d ago.  Patient has cramping, diarrhea, fever, and nausea Patient denies abdominal pain, vomiting, URI symptoms, and bleeding Treatment to date: Tylenol , Imodium, ibuprofen Sick contacts: none known No recent travel, abx use, med changes, new foods  Past Medical History:  Diagnosis Date   Allergy     Arthritis    Biceps tendon rupture    bilateral   Clostridium difficile infection 2014   Colon polyps    Diabetes (HCC)    Borderline/Pre-diabetes   GERD (gastroesophageal reflux disease)    History of DVT (deep vein thrombosis)    History of pulmonary embolism    Hyperlipidemia    Hypertension    Internal bleeding hemorrhoids 2012   "might bleed once/month; only when I strain"   Internal prolapsed hemorrhoids    Leg cramps    Neuromuscular disorder (HCC)    leg cramps    Testicular hypofunction    Wart of face    Right cheek    Exam BP 122/80 (BP Location: Left Arm, Patient Position: Sitting)   Pulse 98   Temp 99 F (37.2 C) (Oral)   Resp 16   Ht 5\' 8"  (1.727 m)   Wt 250 lb 6.4 oz (113.6 kg)   SpO2 96%   BMI 38.07 kg/m  General:  well developed, well hydrated, in no apparent distress Skin:  warm, no pallor or diaphoresis, no rashes Throat/Pharynx:  lips and gingiva without lesion; tongue and uvula midline; non-inflamed pharynx; no exudates or postnasal drainage Lungs:  clear to auscultation, breath sounds equal bilaterally, no respiratory distress, no wheezes Cardio:  RRR Abdomen:  abdomen soft, mild ttp in LLQ; bowel sounds normal; no masses or organomegaly Psych: Appropriate judgement/insight  Assessment and Plan  Gastroenteritis - Plan: ondansetron  (ZOFRAN -ODT) 4 MG disintegrating tablet  Zofran  to start and push fluids. 3 d of azithromycin  500 mg/d if not  improving given his fevers. If still worsening, go to ER. Avoid aggravating foods, discussed advancing diet. The patient and his wife voiced understanding and agreement to the plan.  Shellie Dials [place], DO 05/21/24  10:40 AM

## 2024-05-21 NOTE — Telephone Encounter (Signed)
  Chief Complaint: fever, diarrhea Frequency: yesterday Pertinent Negatives: Patient denies others in house with illness,  Disposition: [] ED /[] Urgent Care (no appt availability in office) / [x] Appointment(In office/virtual)/ []  [facility]/ [] Home Care/ [] Refused Recommended Disposition /[] [facility]/ []  Follow-up with PCP Additional Notes: Pt states that he started with a fever yesterday. + chills. Pt states that tylenol  and advil help minimally. Pt took first dose of imodium today. Scheduled with PCP today.  Copied from CRM [id number]. Topic: Clinical - Red Word Triage >> May 21, 2024  8:10 AM Freya Jesus wrote: Red Word that prompted transfer to Nurse Triage: Patient stated he's been experiencing flu like symptoms. Yesterday 104 temp and went down to 101 and now this morning 102. Has really bad diarrhea and unable to eat. Reason for Disposition  Severe chills (i.e., feeling extremely cold WITH shaking chills)  Answer Assessment - Initial Assessment Questions 1. TEMPERATURE: "What is the most recent temperature?"  "How was it measured?"      100.5 at time of call 2. ONSET: "When did the fever start?"      yesterday 3. CHILLS: "Do you have chills?" If yes: "How bad are they?"  (e.g., none, mild, moderate, severe)   - NONE: no chills   - MILD: feeling cold   - MODERATE: feeling very cold, some shivering (feels better under a thick blanket)   - SEVERE: feeling extremely cold with shaking chills (general body shaking, rigors; even under a thick blanket)      severe 4. OTHER SYMPTOMS: "Do you have any other symptoms besides the fever?"  (e.g., abdomen pain, cough, diarrhea, earache, headache, sore throat, urination pain)     diarrhea 5. CAUSE: If there are no symptoms, ask: "What do you think is causing the fever?"      Unsure, maybe the "flu" 6. CONTACTS: "Does anyone else in the family have an infection?"     denies 7. TREATMENT: "What have you done so far to treat this  fever?" (e.g., medications)     Tylenol  and advil 8. IMMUNOCOMPROMISE: "Do you have of the following: diabetes, HIV positive, splenectomy, cancer chemotherapy, chronic steroid treatment, transplant patient, etc."     denies 10. TRAVEL: "Have you traveled out of the country in the last month?" (e.g., travel history, exposures)       denies  Protocols used: [facility]

## 2024-05-21 NOTE — Patient Instructions (Addendum)
 As long as you are vomiting or having diarrhea, please drink fluids with electrolytes like Gatorade, Powerade, or Pedialyte (if you can stomach the taste). Drink these along with water mainly.   Push fluids as best you can. Try to avoid Imodium when able.   If symptoms worsen and we are unable to keep down fluids, go to the ER.   If not starting to improve in the next day or 2 with the anti-nausea medication, try taking the azithromcyin.   Let us  know if you need anything.

## 2024-06-04 ENCOUNTER — Telehealth: Payer: Self-pay | Admitting: Family Medicine

## 2024-06-04 NOTE — Telephone Encounter (Signed)
 Patient is due to renew his [employer] for Trulicity  patient assistance program. He has application and has completed his portion. Patient or his wife will drop off application to the front desk for me to have PCP complete and sign.

## 2024-06-04 NOTE — Telephone Encounter (Signed)
 Paperwork is placed in your box

## 2024-06-04 NOTE — Telephone Encounter (Signed)
 Copied from CRM [id number]. Topic: Clinical - Medication Question >> Jun 03, 2024  4:45 PM Baldo Levan wrote: Reason for CRM: Patient is requesting to leave Tami the pharmacist a message to give the patient a call back when she is available. Please call the patient at [phone]

## 2024-06-04 NOTE — Telephone Encounter (Signed)
 Application for [employer] / Trulicity  patient assistance program received. Completed provider portion and forwarded to Dr Gwenette Lennox to review and sign.

## 2024-06-08 ENCOUNTER — Encounter: Payer: Self-pay | Admitting: Pharmacist

## 2024-06-17 ENCOUNTER — Encounter: Payer: Self-pay | Admitting: Family Medicine

## 2024-06-18 ENCOUNTER — Ambulatory Visit (INDEPENDENT_AMBULATORY_CARE_PROVIDER_SITE_OTHER): Payer: Self-pay | Admitting: Family Medicine

## 2024-06-18 ENCOUNTER — Other Ambulatory Visit (HOSPITAL_BASED_OUTPATIENT_CLINIC_OR_DEPARTMENT_OTHER): Payer: Self-pay

## 2024-06-18 ENCOUNTER — Encounter: Payer: Self-pay | Admitting: Family Medicine

## 2024-06-18 ENCOUNTER — Telehealth: Payer: Self-pay | Admitting: Pharmacist

## 2024-06-18 VITALS — BP 130/80 | HR 83 | Temp 97.8°F | Resp 16 | Ht 68.0 in | Wt 244.8 lb

## 2024-06-18 DIAGNOSIS — L308 Other specified dermatitis: Secondary | ICD-10-CM

## 2024-06-18 MED ORDER — TRIAMCINOLONE ACETONIDE 0.5 % EX OINT
1.0000 | TOPICAL_OINTMENT | Freq: Two times a day (BID) | CUTANEOUS | 0 refills | Status: DC
  Filled 2024-06-18: qty 15, 8d supply, fill #0

## 2024-06-18 NOTE — Telephone Encounter (Signed)
 Called to check on patient's renewal app for [employer] for Trulicity . [employer] Rep reports they did receive app and is currently in the review process. Recommended recheck status in5 to 7 business days.

## 2024-06-18 NOTE — Progress Notes (Signed)
 Chief Complaint  Patient presents with   Follow-up    Follow Up Toe    SOLACE Ian [name] is a 63 y.o. male here for a skin complaint.  Duration: 4 days Location: between 1/2 digit on L foot Pruritic? Yes Painful? No Drainage? No New soaps/lotions/topicals/detergents? No Sick contacts? No Other associated symptoms: was red yesterday, less so now Therapies tried thus far: OTC hydrocortisone   Past Medical History:  Diagnosis Date   Allergy     Arthritis    Biceps tendon rupture    bilateral   Clostridium difficile infection 2014   Colon polyps    Diabetes (HCC)    Borderline/Pre-diabetes   GERD (gastroesophageal reflux disease)    History of DVT (deep vein thrombosis)    History of pulmonary embolism    Hyperlipidemia    Hypertension    Internal bleeding hemorrhoids 2012   might bleed once/month; only when I strain   Internal prolapsed hemorrhoids    Leg cramps    Neuromuscular disorder (HCC)    leg cramps    Testicular hypofunction    Wart of face    Right cheek    BP 130/80 (BP Location: Left Arm, Patient Position: Sitting)   Pulse 83   Temp 97.8 F (36.6 C) (Oral)   Resp 16   Ht 5' 8 (1.727 m)   Wt 244 lb 12.8 oz (111 kg)   SpO2 96%   BMI 37.22 kg/m  Gen: awake, alert, appearing stated age Lungs: No accessory muscle use Skin: See below. Indurated area that is slightly raised. No drainage, erythema, TTP, fluctuance, excoriation Psych: Age appropriate judgment and insight    Pruritic dermatitis - Plan: triamcinolone  ointment (KENALOG ) 0.5 %  Kenalog  as above. Avoid scented products. Don't scratch. Non-scented emollients.  F/u prn. The patient voiced understanding and agreement to the plan.  Mabel [place], DO 06/18/24 3:53 PM

## 2024-06-18 NOTE — Patient Instructions (Addendum)
 Try not to scratch as this can make things worse. Avoid scented products while dealing with this. You may resume when the itchiness resolves. Cold/cool compresses can help.   Use the cream up to 2 weeks.   Let us  know if you need anything.

## 2024-07-06 ENCOUNTER — Other Ambulatory Visit: Payer: Self-pay | Admitting: Family Medicine

## 2024-08-03 ENCOUNTER — Other Ambulatory Visit: Payer: Self-pay | Admitting: Family Medicine

## 2024-08-18 ENCOUNTER — Ambulatory Visit: Payer: Self-pay | Admitting: Family Medicine

## 2024-08-26 ENCOUNTER — Encounter: Payer: Self-pay | Admitting: Family Medicine

## 2024-08-26 ENCOUNTER — Ambulatory Visit: Payer: Self-pay | Admitting: Family Medicine

## 2024-08-26 ENCOUNTER — Other Ambulatory Visit (HOSPITAL_BASED_OUTPATIENT_CLINIC_OR_DEPARTMENT_OTHER): Payer: Self-pay

## 2024-08-26 ENCOUNTER — Ambulatory Visit (INDEPENDENT_AMBULATORY_CARE_PROVIDER_SITE_OTHER): Payer: Self-pay | Admitting: Family Medicine

## 2024-08-26 ENCOUNTER — Other Ambulatory Visit: Payer: Self-pay

## 2024-08-26 VITALS — BP 128/74 | HR 84 | Temp 98.0°F | Resp 16 | Ht 68.0 in | Wt 245.8 lb

## 2024-08-26 DIAGNOSIS — E1169 Type 2 diabetes mellitus with other specified complication: Secondary | ICD-10-CM | POA: Diagnosis not present

## 2024-08-26 DIAGNOSIS — I10 Essential (primary) hypertension: Secondary | ICD-10-CM | POA: Diagnosis not present

## 2024-08-26 DIAGNOSIS — E669 Obesity, unspecified: Secondary | ICD-10-CM | POA: Diagnosis not present

## 2024-08-26 DIAGNOSIS — Z6837 Body mass index (BMI) 37.0-37.9, adult: Secondary | ICD-10-CM

## 2024-08-26 DIAGNOSIS — Z7985 Long-term (current) use of injectable non-insulin antidiabetic drugs: Secondary | ICD-10-CM | POA: Diagnosis not present

## 2024-08-26 LAB — LIPID PANEL
Cholesterol: 172 mg/dL (ref 0–200)
HDL: 33.4 mg/dL — ABNORMAL LOW (ref >39.00)
LDL Cholesterol: 95 mg/dL (ref 0–99)
NonHDL: 138.88
Total CHOL/HDL Ratio: 5
Triglycerides: 217 mg/dL — ABNORMAL HIGH (ref 0.0–149.0)
VLDL: 43.4 mg/dL — ABNORMAL HIGH (ref 0.0–40.0)

## 2024-08-26 LAB — COMPREHENSIVE METABOLIC PANEL WITH GFR
ALT: 40 U/L (ref 0–53)
AST: 32 U/L (ref 0–37)
Albumin: 4.4 g/dL (ref 3.5–5.2)
Alkaline Phosphatase: 55 U/L (ref 39–117)
BUN: 15 mg/dL (ref 6–23)
CO2: 29 meq/L (ref 19–32)
Calcium: 9.7 mg/dL (ref 8.4–10.5)
Chloride: 106 meq/L (ref 96–112)
Creatinine, Ser: 1.11 mg/dL (ref 0.40–1.50)
GFR: 69.95 mL/min (ref >60.00)
Glucose, Bld: 72 mg/dL (ref 70–99)
Potassium: 4.3 meq/L (ref 3.5–5.1)
Sodium: 143 meq/L (ref 135–145)
Total Bilirubin: 0.6 mg/dL (ref 0.2–1.2)
Total Protein: 7.2 g/dL (ref 6.0–8.3)

## 2024-08-26 LAB — HEMOGLOBIN A1C: Hgb A1c MFr Bld: 7.1 % — ABNORMAL HIGH (ref 4.6–6.5)

## 2024-08-26 LAB — CBC
HCT: 46.9 % (ref 39.0–52.0)
Hemoglobin: 15.7 g/dL (ref 13.0–17.0)
MCHC: 33.4 g/dL (ref 30.0–36.0)
MCV: 94.6 fl (ref 78.0–100.0)
Platelets: 165 K/uL (ref 150.0–400.0)
RBC: 4.96 Mil/uL (ref 4.22–5.81)
RDW: 13.8 % (ref 11.5–15.5)
WBC: 5.4 K/uL (ref 4.0–10.5)

## 2024-08-26 MED ORDER — TIRZEPATIDE 15 MG/0.5ML ~~LOC~~ SOAJ
15.0000 mg | SUBCUTANEOUS | 1 refills | Status: DC
  Filled 2024-08-26: qty 6, 84d supply, fill #0

## 2024-08-26 MED ORDER — TIRZEPATIDE 12.5 MG/0.5ML ~~LOC~~ SOAJ
12.5000 mg | SUBCUTANEOUS | 0 refills | Status: DC
Start: 2024-11-18 — End: 2024-09-02
  Filled 2024-08-26: qty 2, 28d supply, fill #0

## 2024-08-26 MED ORDER — TIRZEPATIDE 7.5 MG/0.5ML ~~LOC~~ SOAJ
7.5000 mg | SUBCUTANEOUS | 0 refills | Status: DC
  Filled 2024-08-26: qty 2, 28d supply, fill #0

## 2024-08-26 MED ORDER — TIRZEPATIDE 5 MG/0.5ML ~~LOC~~ SOAJ
5.0000 mg | SUBCUTANEOUS | 0 refills | Status: DC
  Filled 2024-08-26 (×2): qty 2, 28d supply, fill #0

## 2024-08-26 MED ORDER — TIRZEPATIDE 10 MG/0.5ML ~~LOC~~ SOAJ
10.0000 mg | SUBCUTANEOUS | 0 refills | Status: DC
  Filled 2024-08-26: qty 2, 28d supply, fill #0

## 2024-08-26 NOTE — Patient Instructions (Addendum)
Give us 2-3 business days to get the results of your labs back.   Keep the diet clean and stay active.  I recommend getting the flu shot in mid October. This suggestion would change if the CDC comes out with a different recommendation.   Let us know if you need anything. 

## 2024-08-26 NOTE — Progress Notes (Signed)
 Subjective:   Chief Complaint  Patient presents with   Follow-up    Follow up    Ian [name] is a 63 y.o. male here for follow-up of diabetes.   Ian [name]'s self monitored glucose range is 80-low 100's.  Patient denies hypoglycemic reactions. He checks his glucose levels 1 time(s) per day. Patient does not require insulin .   Medications include: Trulicity  1.5 mg/week, glyburide  5 mg qhs.  Diet is healthy.  Exercise: active with work.  Hypertension Patient presents for hypertension follow up. He does not monitor home blood pressures. He is compliant with medications- losartan  50 mg/d. Patient has these side effects of medication: none Diet/exercise as above. No CP or SOB.   Past Medical History:  Diagnosis Date   Allergy     Arthritis    Biceps tendon rupture    bilateral   Clostridium difficile infection 2014   Colon polyps    Diabetes (HCC)    Borderline/Pre-diabetes   GERD (gastroesophageal reflux disease)    History of DVT (deep vein thrombosis)    History of pulmonary embolism    Hyperlipidemia    Hypertension    Internal bleeding hemorrhoids 2012   might bleed once/month; only when I strain   Internal prolapsed hemorrhoids    Leg cramps    Neuromuscular disorder (HCC)    leg cramps    Testicular hypofunction    Wart of face    Right cheek     Related testing: Retinal exam: Done Pneumovax: done  Objective:  BP 128/74 (BP Location: Left Arm, Patient Position: Sitting)   Pulse 84   Temp 98 F (36.7 C) (Oral)   Resp 16   Ht 5' 8 (1.727 m)   Wt 245 lb 12.8 oz (111.5 kg)   SpO2 98%   BMI 37.37 kg/m  General:  Well developed, well nourished, in no apparent distress Skin:  Warm, no pallor or diaphoresis Lungs:  CTAB, no access msc use Cardio:  RRR, no bruits, no LE edema Musculoskeletal:  Symmetrical muscle groups noted without atrophy or deformity Neuro:  Sensation intact to pinprick on feet Psych: Age appropriate judgment and  insight  Assessment:   Type 2 diabetes mellitus with obesity (HCC) - Plan: Comprehensive metabolic panel with GFR, Lipid panel, Hemoglobin A1c, CBC  Essential hypertension   Plan:   Chronic, stable. Cont glyburide  5 mg qhs- maybe we can drop this one. Change Trulicity  to Mounjaro . He will let us  know if there are cos tissues. Counseled on diet and exercise. Chronic, stable. Cont losartan  50 mg/d.  Feet are cold. Pulses and sensation great. No pain a/w it. Likely genetic component, does not sound like PVD.  F/u in 6 mo. The patient voiced understanding and agreement to the plan.  Mabel Mt [place], DO 08/26/24 11:19 AM

## 2024-08-27 ENCOUNTER — Other Ambulatory Visit: Payer: Self-pay | Admitting: Family Medicine

## 2024-08-27 ENCOUNTER — Other Ambulatory Visit (HOSPITAL_BASED_OUTPATIENT_CLINIC_OR_DEPARTMENT_OTHER): Payer: Self-pay

## 2024-08-27 DIAGNOSIS — M545 Low back pain, unspecified: Secondary | ICD-10-CM

## 2024-08-27 MED ORDER — HYDROCODONE-ACETAMINOPHEN 5-325 MG PO TABS
1.0000 | ORAL_TABLET | Freq: Three times a day (TID) | ORAL | 0 refills | Status: AC | PRN
  Filled 2024-08-27: qty 30, 10d supply, fill #0

## 2024-08-28 ENCOUNTER — Other Ambulatory Visit (HOSPITAL_BASED_OUTPATIENT_CLINIC_OR_DEPARTMENT_OTHER): Payer: Self-pay

## 2024-09-01 ENCOUNTER — Telehealth: Payer: Self-pay

## 2024-09-01 NOTE — Telephone Encounter (Signed)
 Scheduled for pcv20 on 12/01/24

## 2024-09-01 NOTE — Telephone Encounter (Signed)
 Copied from CRM [id number]. Topic: Clinical - Medication Question >> Sep 01, 2024 10:20 AM Emylou G wrote: Reason for CRM: Patient wants to order pneumonia shot the same day as his labs 12/8 >> Sep 01, 2024 10:22 AM Emylou G wrote: [place] cha to call him if you need him

## 2024-09-02 ENCOUNTER — Other Ambulatory Visit (HOSPITAL_BASED_OUTPATIENT_CLINIC_OR_DEPARTMENT_OTHER): Payer: Self-pay

## 2024-09-02 ENCOUNTER — Other Ambulatory Visit: Payer: Self-pay | Admitting: Family Medicine

## 2024-09-02 ENCOUNTER — Encounter: Payer: Self-pay | Admitting: Family Medicine

## 2024-09-02 MED ORDER — SEMAGLUTIDE (1 MG/DOSE) 4 MG/3ML ~~LOC~~ SOPN
1.0000 mg | PEN_INJECTOR | SUBCUTANEOUS | 0 refills | Status: AC
  Filled 2024-09-02 – 2024-10-24 (×2): qty 3, 28d supply, fill #0

## 2024-09-02 MED ORDER — SEMAGLUTIDE (2 MG/DOSE) 8 MG/3ML ~~LOC~~ SOPN
2.0000 mg | PEN_INJECTOR | SUBCUTANEOUS | 2 refills | Status: AC
  Filled 2024-09-02: qty 3, fill #0
  Filled 2024-10-24 – 2024-11-10 (×2): qty 3, 28d supply, fill #0
  Filled 2024-12-10: qty 3, 28d supply, fill #1
  Filled 2025-01-02: qty 3, 28d supply, fill #2

## 2024-09-02 MED ORDER — OZEMPIC (0.25 OR 0.5 MG/DOSE) 2 MG/3ML ~~LOC~~ SOPN
0.5000 mg | PEN_INJECTOR | SUBCUTANEOUS | 0 refills | Status: AC
  Filled 2024-09-02: qty 3, 28d supply, fill #0

## 2024-09-08 ENCOUNTER — Other Ambulatory Visit (HOSPITAL_BASED_OUTPATIENT_CLINIC_OR_DEPARTMENT_OTHER): Payer: Self-pay

## 2024-09-08 ENCOUNTER — Telehealth: Payer: Self-pay

## 2024-09-08 ENCOUNTER — Other Ambulatory Visit (HOSPITAL_COMMUNITY): Payer: Self-pay

## 2024-09-08 NOTE — Telephone Encounter (Signed)
 Msg from pharmacy. Pt picked up rx 5 days ago.

## 2024-09-08 NOTE — Telephone Encounter (Signed)
 Copied from CRM [id number]. Topic: Clinical - Medication Question >> Sep 08, 2024  2:27 PM Ian [name] wrote: Reason for CRM: Patient requested call back regarding the Semaglutide ,0.25 or 0.5MG /DOS, (OZEMPIC , 0.25 OR 0.5 MG/DOSE,) 2 MG/3ML SOPN - insurance is not covering it and he doesn't know what to do. Please call him at [phone] (M)

## 2024-09-08 NOTE — Telephone Encounter (Signed)
 Needs PA for Ozempic  please

## 2024-09-10 ENCOUNTER — Telehealth: Payer: Self-pay

## 2024-09-10 NOTE — Telephone Encounter (Signed)
 See other telephone note. Already discussed w/ Pt.

## 2024-09-10 NOTE — Telephone Encounter (Signed)
 Copied from CRM [id number]. Topic: Clinical - Medical Advice >> Sep 10, 2024  4:00 PM Anairis L wrote: Reason for CRM: Patient would like a call from Tammy/pharmacist regarding a medication question he has.  Thank you.

## 2024-09-10 NOTE — Telephone Encounter (Unsigned)
 Copied from CRM [id number]. Topic: Clinical - Medication Prior Auth >> Sep 10, 2024  4:23 PM Frederich PARAS wrote: Reason for CRM: sonu from [facility] , she adv they received a pre auth for winjaro 5mg   she needs the clinical information from pcp. The diagnosis of the requested medication and a 1 c level chart notes [phone] opt 2 >> Sep 10, 2024  4:27 PM Frederich PARAS wrote:  fax # [phone]

## 2024-09-10 NOTE — Telephone Encounter (Signed)
 Spoke w/ Pt- per chart Pt should be on Ozempic , he informed me that he is still currently taking Mounjaro  but PCP had to switch to Ozempic  due to ins coverage. Informed for him to finish Mounjaro  and then switch to Ozempic  since he already has 0.25mg  dose at home in fridge, he is to let us  know how he is doing on that dose 2-3 weeks after starting as PCP has already sent in 1mg  dose. Pt verbalized understanding.

## 2024-09-11 NOTE — Telephone Encounter (Signed)
 Left message on patient's VM with my CB#[phone] in case he has any questions about how to take Ozempic . OK for him to finish Mounjaro  that he has on hand and then will switch to Ozempic  0.5mg  weekly for 4 week, 1mg  weekly for 4 weeks, then 2mg  weekly thereafter per DR Wendling's orders.

## 2024-09-16 ENCOUNTER — Other Ambulatory Visit: Payer: Self-pay | Admitting: Family Medicine

## 2024-09-16 DIAGNOSIS — E1169 Type 2 diabetes mellitus with other specified complication: Secondary | ICD-10-CM

## 2024-09-26 ENCOUNTER — Encounter: Payer: Self-pay | Admitting: Family Medicine

## 2024-09-26 ENCOUNTER — Ambulatory Visit (INDEPENDENT_AMBULATORY_CARE_PROVIDER_SITE_OTHER): Admitting: Family Medicine

## 2024-09-26 VITALS — BP 132/80 | HR 76 | Temp 97.6°F | Resp 16 | Ht 68.0 in | Wt 240.0 lb

## 2024-09-26 DIAGNOSIS — E669 Obesity, unspecified: Secondary | ICD-10-CM

## 2024-09-26 DIAGNOSIS — Z6836 Body mass index (BMI) 36.0-36.9, adult: Secondary | ICD-10-CM | POA: Diagnosis not present

## 2024-09-26 DIAGNOSIS — Z7985 Long-term (current) use of injectable non-insulin antidiabetic drugs: Secondary | ICD-10-CM | POA: Diagnosis not present

## 2024-09-26 DIAGNOSIS — E119 Type 2 diabetes mellitus without complications: Secondary | ICD-10-CM

## 2024-09-26 DIAGNOSIS — Z23 Encounter for immunization: Secondary | ICD-10-CM | POA: Diagnosis not present

## 2024-09-26 MED ORDER — GVOKE HYPOPEN 2-PACK 1 MG/0.2ML ~~LOC~~ SOAJ: SUBCUTANEOUS | 1 refills | Status: AC

## 2024-09-26 NOTE — Progress Notes (Signed)
 Subjective:   Chief Complaint  Patient presents with   Blood Sugar Problem    Blood Sugar Problem    Ian [name] is a 63 y.o. male here for follow-up of diabetes.   Ian [name] self monitored glucose range is 70-90's.  Patient confirms hypoglycemic reactions. He checks his glucose levels 1 time(s) per day. Patient does not require insulin .   Medications include: Mounjaro  5 mg/week (also took Trulicity . Glyburide  5 mg bid He has some constipation issues with the Mounjaro . Diet is healthy- decreased PO intake.  Exercise: active w work (landscaper) No CP or SOB.   Past Medical History:  Diagnosis Date   Allergy     Arthritis    Biceps tendon rupture    bilateral   Clostridium difficile infection 2014   Colon polyps    Diabetes (HCC)    Borderline/Pre-diabetes   GERD (gastroesophageal reflux disease)    History of DVT (deep vein thrombosis)    History of pulmonary embolism    Hyperlipidemia    Hypertension    Internal bleeding hemorrhoids 2012   might bleed once/month; only when I strain   Internal prolapsed hemorrhoids    Leg cramps    Neuromuscular disorder (HCC)    leg cramps    Testicular hypofunction    Wart of face    Right cheek     Related testing: Retinal exam: Done Pneumovax: due  Objective:  BP 132/80 (BP Location: Left Arm, Patient Position: Sitting)   Pulse 76   Temp 97.6 F (36.4 C) (Oral)   Resp 16   Ht 5' 8 (1.727 m)   Wt 240 lb (108.9 kg)   SpO2 97%   BMI 36.49 kg/m  General:  Well developed, well nourished, in no apparent distress Lungs:  CTAB, no access msc use Cardio:  RRR, no bruits, no LE edema Psych: Age appropriate judgment and insight  Assessment:   Type 2 diabetes mellitus in patient with obesity (HCC)  Pneumococcal vaccination given - Plan: Pneumococcal conjugate vaccine 20-valent (Prevnar 20)   Plan:   Adverse effect of medication.  He will stop Mounjaro .  He took Trulicity  this morning.  Next week he will transition to  0.5 mg weekly of Ozempic  and titrate up.  Continue to monitor sugars.  If he is dropping, we will decrease his glyburide .  Counseled on diet and exercise.  PCV 20 today. Follow-up as originally scheduled. The patient voiced understanding and agreement to the plan.  Mabel Mt [place], DO 09/26/24 11:08 AM

## 2024-09-26 NOTE — Patient Instructions (Signed)
 Take the Ozempic  next week instead of the Trulicity .  We are done with the Mounjaro  for now.   Let us  know if you need anything.

## 2024-10-24 ENCOUNTER — Telehealth: Payer: Self-pay | Admitting: Pharmacist

## 2024-10-24 ENCOUNTER — Encounter: Payer: Self-pay | Admitting: Pharmacist

## 2024-10-24 ENCOUNTER — Other Ambulatory Visit: Payer: Self-pay | Admitting: Family Medicine

## 2024-10-24 ENCOUNTER — Other Ambulatory Visit (HOSPITAL_BASED_OUTPATIENT_CLINIC_OR_DEPARTMENT_OTHER): Payer: Self-pay

## 2024-10-24 NOTE — Telephone Encounter (Signed)
 Refill declined - patient was to only take 0.5mg  dose of Ozempic  for 4 weeks, then increase to 1mg  weekly. Patient and pharmacy notified.

## 2024-10-24 NOTE — Progress Notes (Signed)
 10/24/2024 Name: [name] MRN: [id number] DOB: [birth year]  Chief Complaint  Patient presents with   Medication Adherence    Subjective: Ian [name] is a 63 y.o. year old male who reached out to clinical pharmacist with a questions about his Ozempic . He states he tried to take a dose this morning was he was out and needs a new Rx sent to [facility].   Reviewed last visit with Dr Frann. It appears Dr Gena plan for was patient to take Ozempic  0.5mg  weekly for 4 weeks, then increase to Ozempic  1mg  weekly. He sent Rx for Ozempic  1mg  on 09/30/2024    Medication Access/Adherence  Current Pharmacy:  [facility] [place], [place] - [address] [place] [address] Phone: [phone] Fax: [phone]  [facility] [place] [facility] [address] [place] [address] Phone: [phone] Fax: [phone]  [id number] [place] [place] - [address]OSS [address] [place] [address] Phone: [phone] Fax: [phone]   Patient reports affordability concerns with their medications: No  Patient reports access/transportation concerns to their pharmacy: No  Patient reports adherence concerns with their medications:  Yes      Diabetes:  Current medications:  Ozempic  0.5mg  weekly, glyburide  5mg  twice a day Medications tried in the past: trulicity , mounjaro  - stopped due to constipation.   Current glucose readings: mostly 80 to 100. Sometimes has reading in 140's  Macrovascular and Microvascular Risk Reduction:  Statin? no; patient previously intolerant to statin therapy; Tried pitavastatin , pravastatin , atorvastatin  and rosuvastatin  - all cause myalgias. Exclusion code used 02/2024 - statin induced myositis. ACEi/ARB? yes (losartan ) Last urinary albumin/creatinine ratio:  Lab Results   Component Value Date   MICRALBCREAT 4.3 02/18/2024   MICRALBCREAT 2 02/27/2020   MICRALBCREAT 6.5 07/29/2014   Last eye exam:  Lab Results  Component Value Date   HMDIABEYEEXA No Retinopathy 04/15/2024   Last foot exam: No foot exam found Tobacco Use:  Tobacco Use: Low Risk  (09/26/2024)   Patient History    Smoking Tobacco Use: Never    Smokeless Tobacco Use: Never    Passive Exposure: Not on file     Objective:  Lab Results  Component Value Date   HGBA1C 7.1 (H) 08/26/2024    Lab Results  Component Value Date   CREATININE 1.11 08/26/2024   BUN 15 08/26/2024   NA 143 08/26/2024   K 4.3 08/26/2024   CL 106 08/26/2024   CO2 29 08/26/2024    Lab Results  Component Value Date   CHOL 172 08/26/2024   HDL 33.40 (L) 08/26/2024   LDLCALC 95 08/26/2024   LDLDIRECT 120.0 05/10/2021   TRIG 217.0 (H) 08/26/2024   CHOLHDL 5 08/26/2024    Medications Reviewed Today     Reviewed by Carla Milling, RPH-CPP (Pharmacist) on 10/24/24 at 0849  Med List Status: <None>   Medication Order Taking? Sig Documenting Provider Last Dose Status Informant  Blood Glucose Monitoring Suppl (RELION CONFIRM GLUCOSE MONITOR) W/DEVICE KIT [id number] No USE AS DIRECTED TO TEST BLOOD GLUCOSE DAILY DX: 250.00 Ian Elsie JAYSON, PA-C Taking Active Self  Cholecalciferol  (VITAMIN D-3) 5000 UNITS TABS [id number] No Take 5,000 Units by mouth daily.  [provider] Taking Active Self  cyclobenzaprine  (FLEXERIL ) 10 MG tablet [id number] No TAKE ONE TABLET BY MOUTH THREE TIMES A DAY AS NEEDED MUSCLE  SPASMS Frann Mabel Mt, DO Taking Active   EPIPEN  2-PAK 0.3 MG/0.3ML SOAJ injection [id number] No Inject 0.3 mg into the skin once. [provider] Taking Active Self           Med Note Ian [name] [place] Apr 29, 2020  3:41 PM)    ezetimibe  (ZETIA ) 10 MG tablet [id number]  TAKE 1 TABLET BY MOUTH DAILY Frann Mabel Mt, DO  Active   fenofibrate  (TRICOR ) 145 MG tablet [id number] No  TAKE 1 TABLET BY MOUTH DAILY Frann Mabel Mt, DO Taking Active   fexofenadine (ALLEGRA) 180 MG tablet [id number] No Take 180 mg by mouth daily. [provider] Taking Active   Glucagon (GVOKE HYPOPEN 2-PACK) 1 MG/0.2ML SOAJ [id number]  Inject into the skin as needed for hypoglycemia. Frann Mabel Mt, DO  Active   glyBURIDE  (DIABETA ) 5 MG tablet [id number]  TAKE 1 TABLET BY MOUTH TWICE A DAY WITH A MEAL Wendling, Mabel Mt, DO  Active   HYDROcodone -acetaminophen  (NORCO/VICODIN) 5-325 MG tablet [id number]  Take 1 tablet by mouth every 8 (eight) hours as needed for moderate pain (pain score 4-6). Frann Mabel Mt, DO  Active   losartan  (COZAAR ) 50 MG tablet [id number]  TAKE 1 TABLET BY MOUTH DAILY Frann Mabel Mt, DO  Active   Magnesium  400 MG TABS [id number] No Take 800 mg by mouth daily.  [provider] Taking Active Self  mometasone  (NASONEX ) 50 MCG/ACT nasal spray [id number] No Place 2 sprays into the nose daily.  Patient taking differently: Place 2 sprays into the nose daily as needed.   Antonio Cyndee Rockers R, DO Taking Active   mupirocin  cream (BACTROBAN ) 2 % [id number] No Apply 1 Application topically 2 (two) times daily. Frann Mabel Mt, DO Taking Active   omeprazole  (PRILOSEC) 40 MG capsule [id number]  TAKE 1 CAPSULE BY MOUTH TWO TIMES A DAY Frann Mabel Mt, DO  Active   potassium gluconate 595 (99 K) MG TABS tablet [id number] No Take 595 mg by mouth daily.  [provider] Taking Active Self  Semaglutide , 1 MG/DOSE, 4 MG/3ML SOPN [id number]  Inject 1 mg as directed once a week for 28 days. Frann Mabel Mt, DO  Active   Semaglutide , 2 MG/DOSE, 8 MG/3ML SOPN [id number]  Inject 2 mg into the skin once a week. Frann Mabel Mt, DO  Active   XARELTO  20 MG TABS tablet [id number] No TAKE 1 TABLET BY MOUTH DAILY WITH SUPPER Frann Mabel Mt, DO Taking Active            Med Note Ian [name]   Wed Dec 05, 2023   9:38 AM) Approved thru 12/03/2024 for [facility] PAP              Assessment/Plan:   Medication Access:  - provided education to patient about Ozempic  titration. He should now start Ozempic  1mg  weekly.  - Coordinated with pharmacy to fill Ozempic  1mg  weekly.   Follow up Plan: check back with patient in 3 weeks for planned titration to 2mg  weekly if tolerating.   Madelin Ray, PharmD Clinical Pharmacist [facility] Primary [facility]  Population Health [phone]

## 2024-10-31 ENCOUNTER — Other Ambulatory Visit (HOSPITAL_COMMUNITY): Payer: Self-pay

## 2024-10-31 ENCOUNTER — Other Ambulatory Visit (HOSPITAL_BASED_OUTPATIENT_CLINIC_OR_DEPARTMENT_OTHER): Payer: Self-pay

## 2024-11-02 ENCOUNTER — Other Ambulatory Visit: Payer: Self-pay | Admitting: Family Medicine

## 2024-11-10 ENCOUNTER — Other Ambulatory Visit: Admitting: Pharmacist

## 2024-11-10 ENCOUNTER — Other Ambulatory Visit: Payer: Self-pay | Admitting: Family Medicine

## 2024-11-10 ENCOUNTER — Other Ambulatory Visit (HOSPITAL_BASED_OUTPATIENT_CLINIC_OR_DEPARTMENT_OTHER): Payer: Self-pay

## 2024-11-10 DIAGNOSIS — E669 Obesity, unspecified: Secondary | ICD-10-CM

## 2024-11-10 MED ORDER — GLYBURIDE 5 MG PO TABS: 2.5000 mg | ORAL_TABLET | Freq: Two times a day (BID) | ORAL | 0 refills | Status: AC

## 2024-11-10 NOTE — Progress Notes (Signed)
 11/10/2024 Name: Ian [name] MRN: [id number] DOB: 1961-10-01  No chief complaint on file.   Subjective: Ian [name] is a 63 y.o. year old male who presented for a phone visit today.   Patient was initially referred to Clinical Pharmacist Practitioner for medication access and diabetes education.     Medication Access/Adherence  Current Pharmacy:  [facility] - [place], [place] - [address] [place] [address] Phone: [phone] Fax: [phone]  [facility] [place] - [facility] [address] [place] [address] Phone: [phone] Fax: [phone]  [id number] [place] [place] - [address] [place] [address] Phone: [phone] Fax: [phone]   Patient reports affordability concerns with their medications: No  Patient reports access/transportation concerns to their pharmacy: No  Patient reports adherence concerns with their medications:  Yes      Diabetes:  Current medications:  Ozempic  1mg  weekly, glyburide  5mg  twice a day  Medications tried in the past: trulicity , mounjaro  - stopped due to constipation.   Not having any constipation or nausea  Current glucose readings: Fasting in the morning usually 70 to 110.  had one reading in the 60's - occurred in the morning.    Macrovascular and Microvascular Risk Reduction:  Statin? no; patient previously intolerant to statin therapy; Tried pitavastatin , pravastatin , atorvastatin  and rosuvastatin  - all cause myalgias. Exclusion code used 02/2024 - statin induced myositis. ACEi/ARB? yes (losartan ) Last urinary albumin/creatinine ratio:  Lab Results  Component Value Date   MICRALBCREAT 4.3 02/18/2024   MICRALBCREAT 2 02/27/2020   MICRALBCREAT 6.5 07/29/2014   Last eye exam:  Lab Results  Component Value Date   HMDIABEYEEXA No  Retinopathy 04/15/2024   Last foot exam: No foot exam found Tobacco Use:  Tobacco Use: Low Risk  (09/26/2024)   Patient History    Smoking Tobacco Use: Never    Smokeless Tobacco Use: Never    Passive Exposure: Not on file     Objective:  Lab Results  Component Value Date   HGBA1C 7.1 (H) 08/26/2024    Lab Results  Component Value Date   CREATININE 1.11 08/26/2024   BUN 15 08/26/2024   NA 143 08/26/2024   K 4.3 08/26/2024   CL 106 08/26/2024   CO2 29 08/26/2024    Lab Results  Component Value Date   CHOL 172 08/26/2024   HDL 33.40 (L) 08/26/2024   LDLCALC 95 08/26/2024   LDLDIRECT 120.0 05/10/2021   TRIG 217.0 (H) 08/26/2024   CHOLHDL 5 08/26/2024    Medications Reviewed Today     Reviewed by Carla Milling, RPH-CPP (Pharmacist) on 11/10/24 at 0817  Med List Status: <None>   Medication Order Taking? Sig Documenting Provider Last Dose Status Informant  Blood Glucose Monitoring Suppl (RELION CONFIRM GLUCOSE MONITOR) W/DEVICE KIT [id number]  USE AS DIRECTED TO TEST BLOOD GLUCOSE DAILY DX: 250.00 Martin, William C, PA-C  Active Self  Cholecalciferol  (VITAMIN D-3) 5000 UNITS TABS [id number]  Take 5,000 Units by mouth daily.  [provider]  Active Self  cyclobenzaprine  (FLEXERIL ) 10 MG tablet [id number]  TAKE ONE TABLET BY MOUTH THREE TIMES A DAY AS NEEDED MUSCLE SPASMS Frann, Mabel Mt, DO  Active   EPIPEN  2-PAK 0.3 MG/0.3ML SOAJ injection [id number]  Inject 0.3 mg into the skin once. [provider]  Active Self  Med Note SOILA LYLE JAYSON [place] Apr 29, 2020  3:41 PM)    ezetimibe  (ZETIA ) 10 MG tablet [id number]  TAKE 1 TABLET BY MOUTH DAILY Frann Mabel Mt, DO  Active   fenofibrate  (TRICOR ) 145 MG tablet [id number]  TAKE 1 TABLET BY MOUTH DAILY Frann Mabel Mt, DO  Active   fexofenadine (ALLEGRA) 180 MG tablet [id number]  Take 180 mg by mouth daily. [provider]  Active   Glucagon (GVOKE HYPOPEN 2-PACK) 1 MG/0.2ML  SOAJ [id number]  Inject into the skin as needed for hypoglycemia. Frann Mabel Mt, DO  Active   glyBURIDE  (DIABETA ) 5 MG tablet [id number]  TAKE 1 TABLET BY MOUTH TWICE A DAY WITH A MEAL Wendling, Mabel Mt, DO  Active   HYDROcodone -acetaminophen  (NORCO/VICODIN) 5-325 MG tablet [id number]  Take 1 tablet by mouth every 8 (eight) hours as needed for moderate pain (pain score 4-6). Frann Mabel Mt, DO  Active   losartan  (COZAAR ) 50 MG tablet [id number]  TAKE 1 TABLET BY MOUTH DAILY Frann Mabel Mt, DO  Active   Magnesium  400 MG TABS [id number]  Take 800 mg by mouth daily.  [provider]  Active Self  mometasone  (NASONEX ) 50 MCG/ACT nasal spray [id number]  Place 2 sprays into the nose daily.  Patient taking differently: Place 2 sprays into the nose daily as needed.   Lowne Chase, Yvonne R, DO  Active   mupirocin  cream (BACTROBAN ) 2 % [id number]  Apply 1 Application topically 2 (two) times daily. Frann Mabel Mt, DO  Active   omeprazole  (PRILOSEC) 40 MG capsule [id number]  TAKE 1 CAPSULE BY MOUTH TWO TIMES A DAY Frann Mabel Mt, DO  Active   potassium gluconate 595 (99 K) MG TABS tablet [id number]  Take 595 mg by mouth daily.  [provider]  Active Self  Semaglutide , 1 MG/DOSE, 4 MG/3ML SOPN [id number] Yes Inject 1 mg as directed once a week for 28 days. Frann Mabel Mt, DO  Active   Semaglutide , 2 MG/DOSE, 8 MG/3ML SOPN [id number]  Inject 2 mg into the skin once a week.  Patient not taking: Reported on 11/10/2024   Frann Mabel Mt, DO  Active   XARELTO  20 MG TABS tablet [id number]  TAKE 1 TABLET BY MOUTH DAILY WITH SUPPER Frann Mabel Mt, DO  Active            Med Note JUSTINO MADELIN KATHEE Stevan Dec 05, 2023  9:38 AM) Approved thru 12/03/2024 for [facility] PAP              Assessment/Plan:   Medication Access:  - provided education to patient about Ozempic  titration. He will start Ozempic  2mg  soon. Will check to see if  can fill now so that if prior authorization is needed we can send in. Coordinated with pharmacy to fill Ozempic .   - Recommended he lower dose of glyburide  from 5mg  twice a day to 2.5mg  twice a day to prevent hypoglycemia. Review signs and symptoms of hypoglycemia and how to treat.   Meds ordered this encounter  Medications   glyBURIDE  (DIABETA ) 5 MG tablet    Sig: Take 0.5 tablets (2.5 mg total) by mouth 2 (two) times daily with a meal.    Dispense:  90 tablet    Refill:  0    Please cancel any previous prescriptions - updated directions. Profile until patient needs.    Follow up Plan: check back with patient in 3 to 4 weeks to check on blood glucose /  Ozempic  titration  Madelin Ray, PharmD Clinical Pharmacist [facility] Primary Care  Population Health [phone]

## 2024-11-10 NOTE — Telephone Encounter (Unsigned)
 Copied from CRM #[id number]. Topic: Clinical - Medication Refill >> Nov 10, 2024  2:18 PM Alfonso ORN wrote: Medication:  XARELTO  20 MG TABS tablet   Has the patient contacted their pharmacy? Yes  This is the patient's preferred pharmacy:  [facility] - [place], [place] - [address] [place] [address] Phone: [phone] Fax: [phone]   Is this the correct pharmacy for this prescription? Yes If no, delete pharmacy and type the correct one.   Has the prescription been filled recently? No  Is the patient out of the medication? No  Has the patient been seen for an appointment in the last year OR does the patient have an upcoming appointment? Yes  Can we respond through MyChart? No

## 2024-11-11 MED ORDER — RIVAROXABAN 20 MG PO TABS: 20.0000 mg | ORAL_TABLET | Freq: Every day | ORAL | 1 refills | Status: AC

## 2024-11-12 ENCOUNTER — Other Ambulatory Visit (HOSPITAL_BASED_OUTPATIENT_CLINIC_OR_DEPARTMENT_OTHER): Payer: Self-pay

## 2024-11-13 ENCOUNTER — Other Ambulatory Visit (HOSPITAL_BASED_OUTPATIENT_CLINIC_OR_DEPARTMENT_OTHER): Payer: Self-pay

## 2024-11-13 ENCOUNTER — Telehealth: Payer: Self-pay | Admitting: Pharmacy Technician

## 2024-11-13 ENCOUNTER — Other Ambulatory Visit (HOSPITAL_COMMUNITY): Payer: Self-pay

## 2024-11-13 NOTE — Telephone Encounter (Signed)
 Pharmacy Patient Advocate Encounter   Received notification from Onbase that prior authorization for Ozempic  (2 MG/DOSE) 8MG /3ML pen-injectors  is required/requested.   Insurance verification completed.   The patient is insured through [facility] ADVANTAGE/RX ADVANCE.   Per test claim: PA required; PA submitted to above mentioned insurance via Fax Key/confirmation #/EOC [id number] Status is pending   Reference #: [id number]

## 2024-11-14 ENCOUNTER — Other Ambulatory Visit (HOSPITAL_COMMUNITY): Payer: Self-pay

## 2024-11-14 ENCOUNTER — Telehealth (HOSPITAL_BASED_OUTPATIENT_CLINIC_OR_DEPARTMENT_OTHER): Payer: Self-pay | Admitting: Pharmacist

## 2024-11-14 ENCOUNTER — Other Ambulatory Visit (HOSPITAL_BASED_OUTPATIENT_CLINIC_OR_DEPARTMENT_OTHER): Payer: Self-pay

## 2024-11-14 NOTE — Telephone Encounter (Signed)
 Pharmacy Patient Advocate Encounter  Received notification from [employer] that Prior Authorization for Ozempic  (2 MG/DOSE) 8MG /3ML pen-injectors  has been APPROVED from 11/13/24 to 11/13/25. Ran test claim, Copay is $0.00. This test claim was processed through [facility]- copay amounts may vary at other pharmacies due to pharmacy/plan contracts, or as the patient moves through the different stages of their insurance plan.   PA #/Case ID/Reference #: [id number]

## 2024-11-14 NOTE — Telephone Encounter (Signed)
 Patient called to notify Clinical Pharmacist Practitioner that he took his last dose of Ozempic  1mg  today and will need 2mg  filled. It looks like prior authorization was submitted for Ozempic  2mg  yesterday. Sent message to pharmacy to see if prior authorization has been approved yet.   Ozempic  2mg  was approved. [facility] was able to fill today.

## 2024-11-21 ENCOUNTER — Other Ambulatory Visit (HOSPITAL_BASED_OUTPATIENT_CLINIC_OR_DEPARTMENT_OTHER): Payer: Self-pay

## 2024-12-01 ENCOUNTER — Other Ambulatory Visit

## 2024-12-01 ENCOUNTER — Ambulatory Visit: Payer: Self-pay | Admitting: Family Medicine

## 2024-12-01 ENCOUNTER — Ambulatory Visit

## 2024-12-01 DIAGNOSIS — E119 Type 2 diabetes mellitus without complications: Secondary | ICD-10-CM | POA: Diagnosis not present

## 2024-12-01 DIAGNOSIS — E669 Obesity, unspecified: Secondary | ICD-10-CM

## 2024-12-01 LAB — LIPID PANEL
Cholesterol: 170 mg/dL (ref 0–200)
HDL: 34.9 mg/dL — ABNORMAL LOW (ref >39.00)
LDL Cholesterol: 104 mg/dL — ABNORMAL HIGH (ref 0–99)
NonHDL: 134.69
Total CHOL/HDL Ratio: 5
Triglycerides: 152 mg/dL — ABNORMAL HIGH (ref 0.0–149.0)
VLDL: 30.4 mg/dL (ref 0.0–40.0)

## 2024-12-01 LAB — HEMOGLOBIN A1C: Hgb A1c MFr Bld: 5.9 % (ref 4.6–6.5)

## 2024-12-10 ENCOUNTER — Other Ambulatory Visit (HOSPITAL_BASED_OUTPATIENT_CLINIC_OR_DEPARTMENT_OTHER): Payer: Self-pay

## 2024-12-10 ENCOUNTER — Telehealth: Payer: Self-pay | Admitting: Pharmacist

## 2024-12-10 NOTE — Telephone Encounter (Signed)
 Patient LM on my VM. He received a MyChart message from [facility] pharmacy but he was not sure what it was regarding. He was having difficulty accessing the message. I suspect it was a reminder to refill his Ozempic  - last filled 11/21 for 28 DS. Patient asked if Ozempic  could be on auto refill. Contact [facility]. They filled Ozempic  for patient and place it on auto-refill for him.  Patient aware he can pick it up today or in the next week at his convenience.

## 2024-12-22 ENCOUNTER — Encounter: Payer: Self-pay | Admitting: Family Medicine

## 2024-12-23 ENCOUNTER — Telehealth: Payer: Self-pay | Admitting: Pharmacist

## 2024-12-23 NOTE — Progress Notes (Signed)
 Contacted [employer]. Cancelled patient's enrollment in [employer] program for Trulicity .  He is now taking Ozempic  and has Medicare coverage thru [employer].

## 2024-12-30 ENCOUNTER — Encounter: Payer: Self-pay | Admitting: Family Medicine

## 2024-12-30 ENCOUNTER — Other Ambulatory Visit (INDEPENDENT_AMBULATORY_CARE_PROVIDER_SITE_OTHER)

## 2024-12-30 VITALS — BP 126/74 | HR 68 | Temp 97.6°F | Resp 16 | Ht 68.0 in | Wt 236.4 lb

## 2024-12-30 DIAGNOSIS — J0100 Acute maxillary sinusitis, unspecified: Secondary | ICD-10-CM | POA: Diagnosis not present

## 2024-12-30 DIAGNOSIS — R059 Cough, unspecified: Secondary | ICD-10-CM

## 2024-12-30 MED ORDER — METHYLPREDNISOLONE ACETATE 80 MG/ML IJ SUSP
80.0000 mg | Freq: Once | INTRAMUSCULAR | Status: AC
  Administered 2024-12-30: 80 mg via INTRAMUSCULAR

## 2024-12-30 MED ORDER — HYDROCODONE BIT-HOMATROP MBR 5-1.5 MG/5ML PO SOLN: 5.0000 mL | Freq: Every day | ORAL | 0 refills | Status: DC

## 2024-12-30 NOTE — Patient Instructions (Addendum)
 Continue to push fluids, practice good hand hygiene, and cover your mouth if you cough.  If you start having fevers, shaking or shortness of breath, seek immediate care.  OK to take Tylenol  1000 mg (2 extra strength tabs) or 975 mg (3 regular strength tabs) every 6 hours as needed.  Send me a message in 2 days if not obviously improving.   Let us  know if you need anything.

## 2024-12-30 NOTE — Progress Notes (Signed)
 Chief Complaint  Patient presents with   Cough    Cough and Congestion    [name] Land here for URI complaints.  Duration: 9 days - not getting better Associated symptoms: sinus congestion, sinus pain, rhinorrhea, L ear pain, myalgia, and productive cough Denies: itchy watery eyes, ear drainage, sore throat, wheezing, shortness of breath, and fevers Treatment to date: Dayquil, Nyquil, Robitussin Sick contacts: No  Past Medical History:  Diagnosis Date   Allergy     Arthritis    Biceps tendon rupture    bilateral   Clostridium difficile infection 2014   Colon polyps    Diabetes (HCC)    Borderline/Pre-diabetes   GERD (gastroesophageal reflux disease)    History of DVT (deep vein thrombosis)    History of pulmonary embolism    Hyperlipidemia    Hypertension    Internal bleeding hemorrhoids 2012   might bleed once/month; only when I strain   Internal prolapsed hemorrhoids    Leg cramps    Neuromuscular disorder (HCC)    leg cramps    Testicular hypofunction    Wart of face    Right cheek    Objective BP 126/74 (BP Location: Left Arm, Patient Position: Sitting)   Pulse 68   Temp 97.6 F (36.4 C) (Oral)   Resp 16   Ht 5' 8 (1.727 m)   Wt 236 lb 6.4 oz (107.2 kg)   SpO2 96%   BMI 35.94 kg/m  General: Awake, alert, appears stated age HEENT: AT, [place], ears patent b/l and TM's neg, nares patent w/o discharge, pharynx pink and without exudates, MMM, +max sinus ttp Neck: No masses or asymmetry Heart: RRR Lungs: CTAB, no accessory muscle use Psych: Age appropriate judgment and insight, normal mood and affect  Acute maxillary sinusitis, recurrence not specified  Depo medrol  injection today. Cough syrup provided. He has tolerated well in past and only plans to use at night. Continue to push fluids, practice good hand hygiene, cover mouth when coughing. Send message in 2 d if no better.  F/u prn. If starting to experience fevers, shaking, or shortness of breath, seek  immediate care. Pt voiced understanding and agreement to the plan.  Mabel Mt [place], DO 12/30/2024 12:03 PM

## 2025-01-01 ENCOUNTER — Encounter: Payer: Self-pay | Admitting: Family Medicine

## 2025-01-05 ENCOUNTER — Other Ambulatory Visit: Payer: Self-pay | Admitting: Family Medicine

## 2025-01-05 ENCOUNTER — Other Ambulatory Visit (HOSPITAL_BASED_OUTPATIENT_CLINIC_OR_DEPARTMENT_OTHER): Payer: Self-pay

## 2025-01-05 MED ORDER — AZITHROMYCIN 250 MG PO TABS
ORAL_TABLET | ORAL | 0 refills | Status: AC
  Filled 2025-01-05: qty 6, 5d supply, fill #0

## 2025-01-21 ENCOUNTER — Ambulatory Visit

## 2025-01-21 ENCOUNTER — Other Ambulatory Visit (HOSPITAL_BASED_OUTPATIENT_CLINIC_OR_DEPARTMENT_OTHER): Payer: Self-pay

## 2025-01-21 ENCOUNTER — Encounter: Payer: Self-pay | Admitting: Family Medicine

## 2025-01-21 ENCOUNTER — Ambulatory Visit (INDEPENDENT_AMBULATORY_CARE_PROVIDER_SITE_OTHER): Admitting: Family Medicine

## 2025-01-21 VITALS — BP 124/76 | HR 98 | Temp 98.0°F | Resp 16 | Ht 68.0 in | Wt 238.4 lb

## 2025-01-21 DIAGNOSIS — R052 Subacute cough: Secondary | ICD-10-CM

## 2025-01-21 DIAGNOSIS — R0989 Other specified symptoms and signs involving the circulatory and respiratory systems: Secondary | ICD-10-CM

## 2025-01-21 DIAGNOSIS — H6992 Unspecified Eustachian tube disorder, left ear: Secondary | ICD-10-CM | POA: Diagnosis not present

## 2025-01-21 DIAGNOSIS — R059 Cough, unspecified: Secondary | ICD-10-CM

## 2025-01-21 MED ORDER — METHYLPREDNISOLONE 4 MG PO TBPK
ORAL_TABLET | ORAL | 0 refills | Status: AC
  Filled 2025-01-21: qty 21, 6d supply, fill #0

## 2025-01-21 NOTE — Patient Instructions (Addendum)
 Continue to push fluids, practice good hand hygiene, and cover your mouth if you cough.  If you start having fevers, shaking or shortness of breath, seek immediate care.  While you are on the medicine, take 1 full tab of your glyburide  twice daily.   Please get your X-ray done at the [facility] [place]: [address], [place], [place] [address] [phone]  You do not need an appointment for this location.   Let us  know if you need anything.

## 2025-01-21 NOTE — Progress Notes (Signed)
 Chief Complaint  Patient presents with   Cough     Cough     [name] here for URI complaints.  Duration: 2 days  Associated symptoms: sinus headache, sinus pain, sore throat, and coughing, some running of nose Denies: sinus congestion, itchy watery eyes, ear pain, ear drainage, wheezing, shortness of breath, myalgia, and fevers Treatment to date: tea w herbs Sick contacts: No  Past Medical History:  Diagnosis Date   Allergy     Arthritis    Biceps tendon rupture    bilateral   Clostridium difficile infection 2014   Colon polyps    Diabetes (HCC)    Borderline/Pre-diabetes   GERD (gastroesophageal reflux disease)    History of DVT (deep vein thrombosis)    History of pulmonary embolism    Hyperlipidemia    Hypertension    Internal bleeding hemorrhoids 2012   might bleed once/month; only when I strain   Internal prolapsed hemorrhoids    Leg cramps    Neuromuscular disorder (HCC)    leg cramps    Testicular hypofunction    Wart of face    Right cheek    Objective BP 124/76 (BP Location: Left Arm, Patient Position: Sitting)   Pulse 98   Temp 98 F (36.7 C) (Oral)   Resp 16   Ht 5' 8 (1.727 m)   Wt 238 lb 6.4 oz (108.1 kg)   SpO2 97%   BMI 36.25 kg/m  General: Awake, alert, appears stated age HEENT: AT, [place], ears patent b/l and TM's neg, nares patent w/o discharge, pharynx pink and without exudates, MMM, no sinus ttp b/l Neck: No masses or asymmetry Heart: RRR Lungs: CTAB, no accessory muscle use Psych: Age appropriate judgment and insight, normal mood and affect  Subacute cough - Plan: DG Chest 2 View  Dysfunction of left eustachian tube  Medrol  Dosepak. Take glyburide  5 mg bid in meanwhile (up from 2.5 mg bid). Send message in 2 d if no better. Ck CXR at [facility] given cough for 1 mo (was improving until 2 d ago). Continue to push fluids, practice good hand hygiene, cover mouth when coughing. F/u prn. If starting to experience worsening  symptoms, shaking, or shortness of breath, seek immediate care. Pt voiced understanding and agreement to the plan.  Mabel Mt [place], DO 01/21/25 1:59 PM

## 2025-01-23 ENCOUNTER — Telehealth: Payer: Self-pay | Admitting: Pharmacist

## 2025-01-23 NOTE — Telephone Encounter (Signed)
 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 He received a letter from [employer] about Xarelto  patient assistance program.  Letter states that he was pre-approved for [employer] patient assistance program. Patient does have [employer] Medicare plan now but he will still have to pay around $52 / month for Xarelto .  Tried to call [employer] patient assistance program but was on hold for > 30 minutes. Will try to call again next week.

## 2025-01-25 ENCOUNTER — Ambulatory Visit: Payer: Self-pay | Admitting: Family Medicine

## 2025-01-26 ENCOUNTER — Other Ambulatory Visit: Payer: Self-pay | Admitting: Family Medicine

## 2025-01-26 ENCOUNTER — Other Ambulatory Visit (HOSPITAL_BASED_OUTPATIENT_CLINIC_OR_DEPARTMENT_OTHER): Payer: Self-pay

## 2025-01-26 MED ORDER — AZELASTINE HCL 0.1 % NA SOLN
1.0000 | Freq: Two times a day (BID) | NASAL | 12 refills | Status: AC
  Filled 2025-01-26: qty 30, 50d supply, fill #0

## 2025-01-26 NOTE — Telephone Encounter (Signed)
 Spoke to representative with [employer] patient assistance program. They are requesting that patient's new insurance from [employer] be faxed to them to review. After review of his [employer] / [employer] plan he may or may not still be eligible for [employer] patient assistance program / Xarelto .  Will fax to [phone] when I am back in the office again.

## 2025-01-27 NOTE — Telephone Encounter (Signed)
 Faxed copy of patient's new insurance card to [employer].

## 2025-02-24 ENCOUNTER — Ambulatory Visit: Admitting: Family Medicine
